# Patient Record
Sex: Female | Born: 1950
Health system: Southern US, Community
[De-identification: ages and names within clinical notes are randomized; demographics above are authoritative.]

## PROBLEM LIST (undated history)

## (undated) ENCOUNTER — Emergency Department (HOSPITAL_COMMUNITY): Admission: EM | Payer: Medicare PPO | Source: Home / Self Care

## (undated) ENCOUNTER — Emergency Department (HOSPITAL_BASED_OUTPATIENT_CLINIC_OR_DEPARTMENT_OTHER): Payer: Medicare PPO

## (undated) DIAGNOSIS — I4510 Unspecified right bundle-branch block: Secondary | ICD-10-CM

## (undated) DIAGNOSIS — E669 Obesity, unspecified: Secondary | ICD-10-CM

## (undated) DIAGNOSIS — J40 Bronchitis, not specified as acute or chronic: Secondary | ICD-10-CM

## (undated) DIAGNOSIS — I6529 Occlusion and stenosis of unspecified carotid artery: Secondary | ICD-10-CM

## (undated) DIAGNOSIS — R4182 Altered mental status, unspecified: Secondary | ICD-10-CM

## (undated) DIAGNOSIS — N183 Chronic kidney disease, stage 3 (moderate): Secondary | ICD-10-CM

## (undated) DIAGNOSIS — I119 Hypertensive heart disease without heart failure: Secondary | ICD-10-CM

## (undated) DIAGNOSIS — E119 Type 2 diabetes mellitus without complications: Secondary | ICD-10-CM

## (undated) DIAGNOSIS — E785 Hyperlipidemia, unspecified: Secondary | ICD-10-CM

## (undated) DIAGNOSIS — D649 Anemia, unspecified: Secondary | ICD-10-CM

## (undated) DIAGNOSIS — I43 Cardiomyopathy in diseases classified elsewhere: Secondary | ICD-10-CM

## (undated) DIAGNOSIS — E079 Disorder of thyroid, unspecified: Secondary | ICD-10-CM

## (undated) DIAGNOSIS — I701 Atherosclerosis of renal artery: Secondary | ICD-10-CM

## (undated) DIAGNOSIS — I428 Other cardiomyopathies: Secondary | ICD-10-CM

## (undated) DIAGNOSIS — R609 Edema, unspecified: Secondary | ICD-10-CM

## (undated) DIAGNOSIS — N179 Acute kidney failure, unspecified: Secondary | ICD-10-CM

## (undated) DIAGNOSIS — I160 Hypertensive urgency: Secondary | ICD-10-CM

## (undated) DIAGNOSIS — K219 Gastro-esophageal reflux disease without esophagitis: Secondary | ICD-10-CM

## (undated) DIAGNOSIS — I2510 Atherosclerotic heart disease of native coronary artery without angina pectoris: Secondary | ICD-10-CM

## (undated) DIAGNOSIS — I5042 Chronic combined systolic (congestive) and diastolic (congestive) heart failure: Secondary | ICD-10-CM

## (undated) DIAGNOSIS — N189 Chronic kidney disease, unspecified: Secondary | ICD-10-CM

## (undated) DIAGNOSIS — R079 Chest pain, unspecified: Secondary | ICD-10-CM

## (undated) DIAGNOSIS — T7840XA Allergy, unspecified, initial encounter: Secondary | ICD-10-CM

## (undated) DIAGNOSIS — Z8543 Personal history of malignant neoplasm of ovary: Secondary | ICD-10-CM

## (undated) DIAGNOSIS — C801 Malignant (primary) neoplasm, unspecified: Secondary | ICD-10-CM

## (undated) DIAGNOSIS — I10 Essential (primary) hypertension: Secondary | ICD-10-CM

## (undated) HISTORY — DX: Gastro-esophageal reflux disease without esophagitis: K21.9

## (undated) HISTORY — DX: Altered mental status, unspecified: R41.82

## (undated) HISTORY — DX: Malignant (primary) neoplasm, unspecified: C80.1

## (undated) HISTORY — DX: Hyperlipidemia, unspecified: E78.5

## (undated) HISTORY — DX: Chronic kidney disease, stage 3 (moderate): N18.3

## (undated) HISTORY — DX: Chronic kidney disease, unspecified: N18.9

## (undated) HISTORY — DX: Hypertensive heart disease without heart failure: I11.9

## (undated) HISTORY — DX: Acute kidney failure, unspecified: N17.9

## (undated) HISTORY — DX: Cardiomyopathy in diseases classified elsewhere: I43

## (undated) HISTORY — DX: Edema, unspecified: R60.9

## (undated) HISTORY — DX: Disorder of thyroid, unspecified: E07.9

## (undated) HISTORY — DX: Chest pain, unspecified: R07.9

## (undated) HISTORY — DX: Other cardiomyopathies: I42.8

## (undated) HISTORY — DX: Atherosclerosis of renal artery: I70.1

## (undated) HISTORY — DX: Anemia, unspecified: D64.9

## (undated) HISTORY — DX: Bronchitis, not specified as acute or chronic: J40

## (undated) HISTORY — DX: Occlusion and stenosis of unspecified carotid artery: I65.29

## (undated) HISTORY — DX: Chronic combined systolic (congestive) and diastolic (congestive) heart failure: I50.42

## (undated) HISTORY — DX: Allergy, unspecified, initial encounter: T78.40XA

## (undated) HISTORY — DX: Hypertensive urgency: I16.0

## (undated) HISTORY — DX: Atherosclerotic heart disease of native coronary artery without angina pectoris: I25.10

## (undated) HISTORY — DX: Type 2 diabetes mellitus without complications: E11.9

---

## 1997-10-12 ENCOUNTER — Ambulatory Visit (HOSPITAL_COMMUNITY): Admission: RE | Admit: 1997-10-12 | Discharge: 1997-10-12 | Payer: Self-pay | Admitting: Gynecology

## 1998-10-15 ENCOUNTER — Encounter: Payer: Self-pay | Admitting: Gynecology

## 1998-10-15 ENCOUNTER — Ambulatory Visit (HOSPITAL_COMMUNITY): Admission: RE | Admit: 1998-10-15 | Discharge: 1998-10-15 | Payer: Self-pay | Admitting: Gynecology

## 1998-10-29 ENCOUNTER — Ambulatory Visit: Admission: RE | Admit: 1998-10-29 | Discharge: 1998-10-29 | Payer: Self-pay | Admitting: Gynecology

## 1998-10-29 ENCOUNTER — Other Ambulatory Visit: Admission: RE | Admit: 1998-10-29 | Discharge: 1998-10-29 | Payer: Self-pay | Admitting: Gynecology

## 1999-11-19 ENCOUNTER — Ambulatory Visit: Admission: RE | Admit: 1999-11-19 | Discharge: 1999-11-19 | Payer: Self-pay | Admitting: Gynecology

## 1999-11-19 ENCOUNTER — Other Ambulatory Visit: Admission: RE | Admit: 1999-11-19 | Discharge: 1999-11-19 | Payer: Self-pay | Admitting: Gynecology

## 1999-11-26 ENCOUNTER — Ambulatory Visit (HOSPITAL_COMMUNITY): Admission: RE | Admit: 1999-11-26 | Discharge: 1999-11-26 | Payer: Self-pay | Admitting: Gynecology

## 1999-11-26 ENCOUNTER — Encounter: Payer: Self-pay | Admitting: Gynecology

## 2003-02-07 ENCOUNTER — Other Ambulatory Visit: Admission: RE | Admit: 2003-02-07 | Discharge: 2003-02-07 | Payer: Self-pay | Admitting: Gynecology

## 2003-02-07 ENCOUNTER — Ambulatory Visit: Admission: RE | Admit: 2003-02-07 | Discharge: 2003-02-07 | Payer: Self-pay | Admitting: Gynecology

## 2004-04-10 ENCOUNTER — Emergency Department (HOSPITAL_COMMUNITY): Admission: EM | Admit: 2004-04-10 | Discharge: 2004-04-10 | Payer: Self-pay | Admitting: Emergency Medicine

## 2004-04-15 ENCOUNTER — Encounter: Admission: RE | Admit: 2004-04-15 | Discharge: 2004-05-08 | Payer: Self-pay | Admitting: Family Medicine

## 2012-07-17 ENCOUNTER — Ambulatory Visit (INDEPENDENT_AMBULATORY_CARE_PROVIDER_SITE_OTHER): Payer: BC Managed Care – PPO | Admitting: Emergency Medicine

## 2012-07-17 VITALS — BP 153/82 | HR 86 | Temp 98.0°F | Resp 18 | Ht 68.0 in | Wt 212.0 lb

## 2012-07-17 DIAGNOSIS — H1045 Other chronic allergic conjunctivitis: Secondary | ICD-10-CM

## 2012-07-17 DIAGNOSIS — H1010 Acute atopic conjunctivitis, unspecified eye: Secondary | ICD-10-CM

## 2012-07-17 MED ORDER — OLOPATADINE HCL 0.1 % OP SOLN: 1.0000 [drp] | Freq: Two times a day (BID) | OPHTHALMIC | Status: DC

## 2012-07-17 NOTE — Progress Notes (Signed)
  Subjective:    Patient ID: Teresa Poole, female    DOB: 1951-07-07, 61 y.o.   MRN: [id number]  HPI Patient presents to clinic with complaints this afternoon, states she has pink eye. She was working at [employer], works as a Retail banker. She had redness of her eye, some soreness with left eye, eyes have been watering. No other complaints. She has had problems with allergies in the past.    Review of Systems     Objective:   Physical Exam  Very pleasant female with normal affect. Alert and oriented. She states her only compliant is with her eyes, redness noted some soreness.  The disc is normal. Lids are normal. There is mild injection lateral conjunctiva of the left      Assessment & Plan:   Meds ordered this encounter  Medications  . olopatadine (PATANOL) 0.1 % ophthalmic solution    Sig: Place 1 drop into both eyes 2 (two) times daily.    Dispense:  5 mL    Refill:  2

## 2012-07-17 NOTE — Progress Notes (Signed)
  Subjective:    Patient ID: Teresa Poole, female    DOB: 1950/09/05, 61 y.o.   MRN: [id number]  HPI    Review of Systems     Objective:   Physical Exam        Assessment & Plan:

## 2013-09-07 ENCOUNTER — Emergency Department (HOSPITAL_COMMUNITY): Payer: BC Managed Care – PPO

## 2013-09-07 ENCOUNTER — Encounter (HOSPITAL_COMMUNITY): Payer: Self-pay | Admitting: Emergency Medicine

## 2013-09-07 ENCOUNTER — Inpatient Hospital Stay (HOSPITAL_COMMUNITY)
Admission: EM | Admit: 2013-09-07 | Discharge: 2013-09-10 | DRG: 193 | Disposition: A | Discharge status: Home / Self Care | Payer: BC Managed Care – PPO | Attending: Internal Medicine | Admitting: Internal Medicine

## 2013-09-07 DIAGNOSIS — T502X5A Adverse effect of carbonic-anhydrase inhibitors, benzothiadiazides and other diuretics, initial encounter: Secondary | ICD-10-CM | POA: Diagnosis not present

## 2013-09-07 DIAGNOSIS — IMO0001 Reserved for inherently not codable concepts without codable children: Secondary | ICD-10-CM | POA: Diagnosis present

## 2013-09-07 DIAGNOSIS — IMO0002 Reserved for concepts with insufficient information to code with codable children: Secondary | ICD-10-CM

## 2013-09-07 DIAGNOSIS — E1165 Type 2 diabetes mellitus with hyperglycemia: Secondary | ICD-10-CM

## 2013-09-07 DIAGNOSIS — I509 Heart failure, unspecified: Secondary | ICD-10-CM | POA: Diagnosis present

## 2013-09-07 DIAGNOSIS — Z9119 Patient's noncompliance with other medical treatment and regimen: Secondary | ICD-10-CM

## 2013-09-07 DIAGNOSIS — J189 Pneumonia, unspecified organism: Principal | ICD-10-CM

## 2013-09-07 DIAGNOSIS — J40 Bronchitis, not specified as acute or chronic: Secondary | ICD-10-CM

## 2013-09-07 DIAGNOSIS — R5383 Other fatigue: Secondary | ICD-10-CM

## 2013-09-07 DIAGNOSIS — R5381 Other malaise: Secondary | ICD-10-CM | POA: Diagnosis present

## 2013-09-07 DIAGNOSIS — R739 Hyperglycemia, unspecified: Secondary | ICD-10-CM

## 2013-09-07 DIAGNOSIS — I159 Secondary hypertension, unspecified: Secondary | ICD-10-CM | POA: Diagnosis present

## 2013-09-07 DIAGNOSIS — Z8543 Personal history of malignant neoplasm of ovary: Secondary | ICD-10-CM

## 2013-09-07 DIAGNOSIS — E669 Obesity, unspecified: Secondary | ICD-10-CM

## 2013-09-07 DIAGNOSIS — R609 Edema, unspecified: Secondary | ICD-10-CM | POA: Diagnosis present

## 2013-09-07 DIAGNOSIS — I110 Hypertensive heart disease with heart failure: Secondary | ICD-10-CM | POA: Diagnosis present

## 2013-09-07 DIAGNOSIS — I152 Hypertension secondary to endocrine disorders: Secondary | ICD-10-CM | POA: Diagnosis present

## 2013-09-07 DIAGNOSIS — I214 Non-ST elevation (NSTEMI) myocardial infarction: Secondary | ICD-10-CM

## 2013-09-07 DIAGNOSIS — I5021 Acute systolic (congestive) heart failure: Secondary | ICD-10-CM | POA: Diagnosis present

## 2013-09-07 DIAGNOSIS — I428 Other cardiomyopathies: Secondary | ICD-10-CM | POA: Diagnosis present

## 2013-09-07 DIAGNOSIS — E1122 Type 2 diabetes mellitus with diabetic chronic kidney disease: Secondary | ICD-10-CM | POA: Diagnosis present

## 2013-09-07 DIAGNOSIS — J3489 Other specified disorders of nose and nasal sinuses: Secondary | ICD-10-CM | POA: Diagnosis present

## 2013-09-07 DIAGNOSIS — Z91199 Patient's noncompliance with other medical treatment and regimen due to unspecified reason: Secondary | ICD-10-CM

## 2013-09-07 DIAGNOSIS — R7989 Other specified abnormal findings of blood chemistry: Secondary | ICD-10-CM

## 2013-09-07 DIAGNOSIS — R6889 Other general symptoms and signs: Secondary | ICD-10-CM

## 2013-09-07 DIAGNOSIS — I5041 Acute combined systolic (congestive) and diastolic (congestive) heart failure: Secondary | ICD-10-CM | POA: Diagnosis present

## 2013-09-07 DIAGNOSIS — N179 Acute kidney failure, unspecified: Secondary | ICD-10-CM

## 2013-09-07 DIAGNOSIS — Z6834 Body mass index (BMI) 34.0-34.9, adult: Secondary | ICD-10-CM

## 2013-09-07 DIAGNOSIS — R748 Abnormal levels of other serum enzymes: Secondary | ICD-10-CM

## 2013-09-07 DIAGNOSIS — I10 Essential (primary) hypertension: Secondary | ICD-10-CM | POA: Diagnosis present

## 2013-09-07 HISTORY — DX: Personal history of malignant neoplasm of ovary: Z85.43

## 2013-09-07 HISTORY — DX: Obesity, unspecified: E66.9

## 2013-09-07 HISTORY — DX: Essential (primary) hypertension: I10

## 2013-09-07 HISTORY — DX: Bronchitis, not specified as acute or chronic: J40

## 2013-09-07 LAB — BASIC METABOLIC PANEL
BUN: 18 mg/dL (ref 6–23)
CO2: 25 mEq/L (ref 19–32)
Calcium: 8.9 mg/dL (ref 8.4–10.5)
Chloride: 96 mEq/L (ref 96–112)
Creatinine, Ser: 1.05 mg/dL (ref 0.50–1.10)
GFR calc Af Amer: 65 mL/min — ABNORMAL LOW (ref >90)
GFR, EST NON AFRICAN AMERICAN: 56 mL/min — AB (ref >90)
GLUCOSE: 460 mg/dL — AB (ref 70–99)
POTASSIUM: 3.9 meq/L (ref 3.7–5.3)
Sodium: 136 mEq/L — ABNORMAL LOW (ref 137–147)

## 2013-09-07 LAB — PRO B NATRIURETIC PEPTIDE: PRO B NATRI PEPTIDE: 1402 pg/mL — AB (ref 0–125)

## 2013-09-07 LAB — GLUCOSE, CAPILLARY
GLUCOSE-CAPILLARY: 437 mg/dL — AB (ref 70–99)
GLUCOSE-CAPILLARY: 299 mg/dL — AB (ref 70–99)
GLUCOSE-CAPILLARY: 252 mg/dL — AB (ref 70–99)
Glucose-Capillary: 233 mg/dL — ABNORMAL HIGH (ref 70–99)
Glucose-Capillary: 210 mg/dL — ABNORMAL HIGH (ref 70–99)

## 2013-09-07 LAB — CBC
HEMATOCRIT: 37.7 % (ref 36.0–46.0)
Hemoglobin: 12.7 g/dL (ref 12.0–15.0)
MCH: 28.3 pg (ref 26.0–34.0)
MCHC: 33.7 g/dL (ref 30.0–36.0)
MCV: 84.2 fL (ref 78.0–100.0)
Platelets: 381 10*3/uL (ref 150–400)
RBC: 4.48 MIL/uL (ref 3.87–5.11)
RDW: 13.1 % (ref 11.5–15.5)
WBC: 7.8 10*3/uL (ref 4.0–10.5)

## 2013-09-07 LAB — POCT I-STAT TROPONIN I: Troponin i, poc: 0.09 ng/mL (ref 0.00–0.08)

## 2013-09-07 LAB — PROTIME-INR
INR: 0.89 (ref 0.00–1.49)
Prothrombin Time: 11.9 seconds (ref 11.6–15.2)

## 2013-09-07 LAB — TROPONIN I: Troponin I: <0.3 ng/mL (ref <0.30)

## 2013-09-07 LAB — MRSA PCR SCREENING: MRSA BY PCR: NEGATIVE

## 2013-09-07 LAB — APTT: APTT: 26 s (ref 24–37)

## 2013-09-07 MED ORDER — SODIUM CHLORIDE 0.9 % IV SOLN: INTRAVENOUS | Status: DC

## 2013-09-07 MED ORDER — ASPIRIN 325 MG PO TABS
325.0000 mg | ORAL_TABLET | Freq: Once | ORAL | Status: AC
  Administered 2013-09-07: 325 mg via ORAL
  Filled 2013-09-07: qty 1

## 2013-09-07 MED ORDER — INSULIN ASPART 100 UNIT/ML ~~LOC~~ SOLN
0.0000 [IU] | SUBCUTANEOUS | Status: DC
  Administered 2013-09-07: 5 [IU] via SUBCUTANEOUS

## 2013-09-07 MED ORDER — ENALAPRILAT 1.25 MG/ML IV SOLN
1.2500 mg | Freq: Once | INTRAVENOUS | Status: AC
  Administered 2013-09-07: 1.25 mg via INTRAVENOUS
  Filled 2013-09-07: qty 1

## 2013-09-07 MED ORDER — HYDRALAZINE HCL 20 MG/ML IJ SOLN
10.0000 mg | Freq: Once | INTRAMUSCULAR | Status: AC
  Administered 2013-09-07: 10 mg via INTRAVENOUS
  Filled 2013-09-07: qty 1
  Filled 2013-09-07: qty 0.5

## 2013-09-07 MED ORDER — FUROSEMIDE 10 MG/ML IJ SOLN
40.0000 mg | Freq: Once | INTRAMUSCULAR | Status: AC
Start: 2013-09-07 — End: 2013-09-07
  Administered 2013-09-07: 40 mg via INTRAVENOUS
  Filled 2013-09-07: qty 4

## 2013-09-07 MED ORDER — SODIUM CHLORIDE 0.9 % IJ SOLN
3.0000 mL | Freq: Two times a day (BID) | INTRAMUSCULAR | Status: DC
  Administered 2013-09-08: 10 mL via INTRAVENOUS
  Administered 2013-09-09 – 2013-09-10 (×3): 3 mL via INTRAVENOUS

## 2013-09-07 MED ORDER — NITROGLYCERIN 0.4 MG SL SUBL: 0.4000 mg | SUBLINGUAL_TABLET | SUBLINGUAL | Status: DC | PRN

## 2013-09-07 MED ORDER — INFLUENZA VAC SPLIT QUAD 0.5 ML IM SUSP
0.5000 mL | INTRAMUSCULAR | Status: AC
  Administered 2013-09-08: 0.5 mL via INTRAMUSCULAR
  Filled 2013-09-07 (×2): qty 0.5

## 2013-09-07 MED ORDER — INSULIN ASPART 100 UNIT/ML ~~LOC~~ SOLN
0.0000 [IU] | Freq: Three times a day (TID) | SUBCUTANEOUS | Status: DC
  Administered 2013-09-08: 5 [IU] via SUBCUTANEOUS

## 2013-09-07 MED ORDER — INSULIN REGULAR HUMAN 100 UNIT/ML IJ SOLN
INTRAMUSCULAR | Status: DC
  Administered 2013-09-07: 3.8 [IU]/h via INTRAVENOUS
  Filled 2013-09-07: qty 1

## 2013-09-07 MED ORDER — PNEUMOCOCCAL VAC POLYVALENT 25 MCG/0.5ML IJ INJ
0.5000 mL | INJECTION | INTRAMUSCULAR | Status: AC
  Administered 2013-09-08: 0.5 mL via INTRAMUSCULAR
  Filled 2013-09-07 (×2): qty 0.5

## 2013-09-07 MED ORDER — AMLODIPINE BESYLATE 5 MG PO TABS
5.0000 mg | ORAL_TABLET | Freq: Every day | ORAL | Status: DC
  Administered 2013-09-07 – 2013-09-10 (×4): 5 mg via ORAL
  Filled 2013-09-07 (×4): qty 1

## 2013-09-07 MED ORDER — GUAIFENESIN ER 600 MG PO TB12
1200.0000 mg | ORAL_TABLET | Freq: Two times a day (BID) | ORAL | Status: DC
  Administered 2013-09-07: 1200 mg via ORAL
  Filled 2013-09-07 (×3): qty 2

## 2013-09-07 MED ORDER — METOPROLOL TARTRATE 25 MG PO TABS
25.0000 mg | ORAL_TABLET | Freq: Two times a day (BID) | ORAL | Status: DC
  Administered 2013-09-07 – 2013-09-10 (×6): 25 mg via ORAL
  Filled 2013-09-07 (×7): qty 1

## 2013-09-07 MED ORDER — ACETAMINOPHEN 325 MG PO TABS: 650.0000 mg | ORAL_TABLET | Freq: Four times a day (QID) | ORAL | Status: DC | PRN

## 2013-09-07 MED ORDER — ONDANSETRON HCL 4 MG/2ML IJ SOLN: 4.0000 mg | Freq: Four times a day (QID) | INTRAMUSCULAR | Status: DC | PRN

## 2013-09-07 MED ORDER — HEPARIN (PORCINE) IN NACL 100-0.45 UNIT/ML-% IJ SOLN
1300.0000 [IU]/h | INTRAMUSCULAR | Status: DC
  Administered 2013-09-07: 1300 [IU]/h via INTRAVENOUS
  Filled 2013-09-07 (×2): qty 250

## 2013-09-07 MED ORDER — HYDRALAZINE HCL 20 MG/ML IJ SOLN
10.0000 mg | INTRAMUSCULAR | Status: DC | PRN
  Administered 2013-09-08: 10 mg via INTRAVENOUS

## 2013-09-07 MED ORDER — ONDANSETRON HCL 4 MG PO TABS: 4.0000 mg | ORAL_TABLET | Freq: Four times a day (QID) | ORAL | Status: DC | PRN

## 2013-09-07 MED ORDER — ACETAMINOPHEN 650 MG RE SUPP: 650.0000 mg | Freq: Four times a day (QID) | RECTAL | Status: DC | PRN

## 2013-09-07 MED ORDER — SODIUM CHLORIDE 0.9 % IV SOLN
1.5000 g | Freq: Four times a day (QID) | INTRAVENOUS | Status: DC
  Administered 2013-09-07 – 2013-09-09 (×7): 1.5 g via INTRAVENOUS
  Filled 2013-09-07 (×9): qty 1.5

## 2013-09-07 MED ORDER — ASPIRIN EC 325 MG PO TBEC
325.0000 mg | DELAYED_RELEASE_TABLET | Freq: Every day | ORAL | Status: DC
  Administered 2013-09-08: 325 mg via ORAL
  Filled 2013-09-07 (×2): qty 1

## 2013-09-07 MED ORDER — HEPARIN BOLUS VIA INFUSION
2200.0000 [IU] | Freq: Once | INTRAVENOUS | Status: AC
  Administered 2013-09-07: 2200 [IU] via INTRAVENOUS
  Filled 2013-09-07: qty 2200

## 2013-09-07 MED ORDER — LEVOFLOXACIN IN D5W 750 MG/150ML IV SOLN: 750.0000 mg | INTRAVENOUS | Status: DC

## 2013-09-07 NOTE — Progress Notes (Signed)
ANTIBIOTIC CONSULT NOTE - INITIAL  Pharmacy Consult for Unasyn Indication: Bronchitis  No Known Allergies  Patient Measurements: Height: 5\' 6"  (167.6 cm) (RN estimate) Weight: 212 lb (96.163 kg) (RN estimate) IBW/kg (Calculated) : 59.3  Vital Signs: Temp: 98.8 F (37.1 C) (01/15 1443) Temp src: Oral (01/15 1443) BP: 175/82 mmHg (01/15 1859) Pulse Rate: 94 (01/15 1859) Intake/Output from previous day:   Intake/Output from this shift:    Labs:  Recent Labs  09/07/13 1534  WBC 7.8  HGB 12.7  PLT 381  CREATININE 1.05   Estimated Creatinine Clearance: 65.8 ml/min (by C-G formula based on Cr of 1.05). No results found for this basename: VANCOTROUGH, VANCOPEAK, VANCORANDOM, GENTTROUGH, GENTPEAK, GENTRANDOM, TOBRATROUGH, TOBRAPEAK, TOBRARND, AMIKACINPEAK, AMIKACINTROU, AMIKACIN,  in the last 72 hours   Microbiology: No results found for this or any previous visit (from the past 720 hour(s)).  Medical History: Past Medical History  Diagnosis Date  . Diabetes mellitus without complication   . Hypertension   . History of ovarian cancer   . Obesity (BMI 30-39.9)     Medications:  Scheduled:  . amLODipine  5 mg Oral Daily  . [START ON 09/08/2013] aspirin EC  325 mg Oral Daily  . aspirin  325 mg Oral Once  . guaiFENesin  1,200 mg Oral BID  . heparin  2,200 Units Intravenous Once  . hydrALAZINE  10 mg Intravenous Once  . [START ON 09/08/2013] influenza vac split quadrivalent PF  0.5 mL Intramuscular Tomorrow-1000  . insulin aspart  0-15 Units Subcutaneous Q4H  . metoprolol tartrate  25 mg Oral BID  . [START ON 09/08/2013] pneumococcal 23 valent vaccine  0.5 mL Intramuscular Tomorrow-1000  . sodium chloride  3 mL Intravenous Q12H   Infusions:  . sodium chloride    . heparin     Assessment: 63 yo female admitted with HTN, hyperglycemia and possible MI and started on heparin drip. Now starting antibiotics for bronchitis - avoiding quinolones due to prolonged QT interval  (512 ms) therefore beginning Unasyn per Pharmacy.  Goal of Therapy:  Eradication of infection Dose per renal function  Plan:   Unasyn 1.5g IV q6h Follow up renal function & cultures  Peggyann Juba, PharmD, BCPS Pager: [phone] 09/07/2013,7:39 PM

## 2013-09-07 NOTE — ED Provider Notes (Addendum)
CSN: [id number]     Arrival date & time 09/07/13  1422 History  First MD Initiated Contact with Patient 09/07/13 1455     Chief Complaint  Patient presents with  . Hyperglycemia  . Hypertension    HPI Pt states she was having sinus congestion and increased mucus in her chest for the last couple of weeks.  Pt has felt like she has not been breathing normally because of it.  She felt bad enough to not go to work the first week of January.  Today she went to work but was not feeling well. Today she had an episode of feeling short of breath but no pain in her chest.   She went to her doctor's today to be checked.  She has not been taking any of her medications for the last few months.  She was found to be hypertensive and hyperglycemic with abnormal blood tests per patient so she was sent to the ED.  Past Medical History  Diagnosis Date  . Diabetes mellitus without complication   . Hypertension    History reviewed. No pertinent past surgical history. No family history on file. History  Substance Use Topics  . Smoking status: Never Smoker   . Smokeless tobacco: Not on file  . Alcohol Use: No   OB History   Grav Para Term Preterm Abortions TAB SAB Ect Mult Living                 Review of Systems  Constitutional: Negative for fever.  Respiratory: Positive for cough.   Gastrointestinal: Negative for vomiting and diarrhea.  Endocrine: Negative for polydipsia.  Genitourinary: Negative for dysuria.  Neurological: Negative for numbness and headaches.  All other systems reviewed and are negative.    Allergies  Review of patient's allergies indicates no known allergies.  Home Medications   Current Outpatient Rx  Name  Route  Sig  Dispense  Refill  . GuaiFENesin (MUCINEX PO)   Oral   Take 1 tablet by mouth daily as needed (cold).         . Pseudoephedrine-APAP-DM (DAYQUIL PO)   Oral   Take 1 tablet by mouth daily as needed (cold).         . sodium-potassium bicarbonate  (ALKA-SELTZER GOLD) TBEF dissolvable tablet   Oral   Take 1 tablet by mouth daily as needed (cold).          BP 190/95  Pulse 114  Temp(Src) 98.8 F (37.1 C) (Oral)  Resp 20  SpO2 97% Physical Exam  Nursing note and vitals reviewed. Constitutional: She appears well-developed and well-nourished. No distress.  Hypertensive   HENT:  Head: Normocephalic and atraumatic.  Right Ear: External ear normal.  Left Ear: External ear normal.  Eyes: Conjunctivae are normal. Right eye exhibits no discharge. Left eye exhibits no discharge. No scleral icterus.  Neck: Neck supple. No tracheal deviation present.  Cardiovascular: Normal rate, regular rhythm and intact distal pulses.   Pulmonary/Chest: Effort normal and breath sounds normal. No stridor. No respiratory distress. She has no wheezes. She has no rales.  Abdominal: Soft. Bowel sounds are normal. She exhibits no distension. There is no tenderness. There is no rebound and no guarding.  Musculoskeletal: She exhibits no edema and no tenderness.  Neurological: She is alert. She has normal strength. No sensory deficit. Cranial nerve deficit:  no gross defecits noted. She exhibits normal muscle tone. She displays no seizure activity. Coordination normal.  Skin: Skin is warm and dry.  No rash noted.  Psychiatric: She has a normal mood and affect.    ED Course  Procedures (including critical care time) Labs Review Labs Reviewed  GLUCOSE, CAPILLARY - Abnormal; Notable for the following:    Glucose-Capillary 437 (*)    All other components within normal limits  BASIC METABOLIC PANEL - Abnormal; Notable for the following:    Sodium 136 (*)    Glucose, Bld 460 (*)    GFR calc non Af Amer 56 (*)    GFR calc Af Amer 65 (*)    All other components within normal limits  PRO B NATRIURETIC PEPTIDE - Abnormal; Notable for the following:    Pro B Natriuretic peptide (BNP) 1402.0 (*)    All other components within normal limits  POCT I-STAT TROPONIN  I - Abnormal; Notable for the following:    Troponin i, poc 0.09 (*)    All other components within normal limits  CBC   Imaging Review Dg Chest 2 View  09/07/2013   CLINICAL DATA:  Shortness of breath, cough  EXAM: CHEST  2 VIEW  COMPARISON:  09/07/2013  FINDINGS: Cardiac silhouette is mild-to-moderately enlarged. Mediastinal contours are unremarkable. Ill-defined area of increased density projects in the region of the lingula. This finding is unchanged compared to previous study. No further focal regions of consolidation or focal infiltrates. The osseous structures are unremarkable.  IMPRESSION: Infiltrate versus atelectasis in the region of the lingula.   Electronically Signed   By: Margaree Mackintosh M.D.   On: 09/07/2013 15:55    EKG Interpretation    Date/Time:  Thursday September 07 2013 15:08:54 EST Ventricular Rate:  102 PR Interval:  150 QRS Duration: 99 QT Interval:  393 QTC Calculation: 512 R Axis:   -18 Text Interpretation:  Sinus tachycardia Left atrial enlargement Left ventricular hypertrophy Poor R wave progression Cannot rule out Anterior infarct Prolonged QT interval No previous tracing Reconfirmed by Ariyon Gerstenberger  MD-J, Lamaj Metoyer ([id number]) on 09/07/2013 4:58:40 PM           Medications  insulin regular (NOVOLIN R,HUMULIN R) 1 Units/mL in sodium chloride 0.9 % 100 mL infusion (3.8 Units/hr Intravenous New Bag/Given 09/07/13 1617)  enalaprilat (VASOTEC) injection 1.25 mg (not administered)  furosemide (LASIX) injection 40 mg (not administered)    MDM   1. Hyperglycemia   2. Hypertension   3. Elevated brain natriuretic peptide (BNP) level    Pt has not been taking her Blood pressure or diabetes medications for months.  She has been having episodes of shortness of breath, ?anginal equivalent.  Asymptomatic now.  Will consult with medicine for admission observation, serial enzymes.     Kathalene Frames, MD 09/07/13 1639  Consulted with Dr Wynonia Lawman, cardiology who will see the  patient.  Kathalene Frames, MD 09/07/13 [phone]

## 2013-09-07 NOTE — H&P (Addendum)
[facility] History and Physical  Teresa Poole [id number] DOB: 06/04/1951 DOA: 09/07/2013  Referring physician: Dr. Tomi Bamberger PCP: Gennette Pac, MD   Chief Complaint:  Productive cough for 3 weeks Shortness of breath with fatigue and chest discomfort x 1 day  HPI:  63 year old obese female with history of hypertension, diabetes mellitus (has not been taking any of her medications for over 3 months for reasons unclear) has been having symptoms of cough with whitish to yellowish phlegm for possible 3 weeks. She also reports fatigue with these symptoms. Today when she went to work she felt extremely tired and short of breath on minimal exertion. She reports that she had to hold onto the wall in order to walk. She also felt chest heaviness and went to see the PCP. At the PCP office she was found to be hypertensive and hyperglycemic and was told to come to the ED. Patient denies any headache, blurred vision, dizziness, nausea, vomiting, orthopnea, PND, abdominal pain, subjective fever or chills, bowel or urinary symptoms. She does report polyuria but denies polydipsia. She reports driving to [place]  3 weeks back. She does report having mild leg swellings which is chronic.   Course in the ED Patient was found to be  Tachycardic to 114 blood pressure elevated to 190/95 mmHg., afebrile and normal O2 sat on room air. Blood work done showed a normal CBC, hyperglycemia with blood glucose of 460. EKG showed LVH with T wave inversion in anterior leads and prolonged QTc,. Chest x-rays showed infiltrate versus atelectasis over the left lingula. A stat troponin in the ED was positive at 0.09. Pro BNP elevated to 1400. [facility]. Patient was given a dose of enalaprilat , IV lasix in the ED.   Review of Systems:  Constitutional: Denies fever, chills, diaphoresis, appetite change,  Fatigue+.  HEENT: Congestion, rhinorrhea, Denies photophobia, eye pain, redness,  hearing loss, ear pain, sore throat,sneezing, mouth sores, trouble swallowing, neck pain, neck stiffness and tinnitus.   Respiratory:  SOB, DOE, cough, chest tightness,  denies wheezing.   Cardiovascular: Chest heaviness +, Denies  palpitations and leg swelling.  Gastrointestinal: Denies nausea, vomiting, abdominal pain, diarrhea, constipation, blood in stool and abdominal distention.  Genitourinary: Denies dysuria, urgency, frequency, hematuria, flank pain and difficulty urinating.  Endocrine: reports polyuria Musculoskeletal: Denies myalgias, back pain, joint swelling, arthralgias and gait problem.  Skin: Denies pallor, rash and wound.  Neurological: weakness, Denies dizziness, seizures, syncope,  light-headedness, numbness and headaches.  Psychiatric/Behavioral: Denies  confusion, nervousness, sleep disturbance and agitation   Past Medical History  Diagnosis Date  . Diabetes mellitus without complication   . Hypertension   . Cancer     ovarian cancer per pt   Past Surgical History  Procedure Laterality Date  . Abdominal hysterectomy     Social History:  reports that she has never smoked. She has never used smokeless tobacco. She reports that she does not drink alcohol or use illicit drugs.  No Known Allergies  History reviewed. No pertinent family history.  Prior to Admission medications   Medication Sig Start Date End Date Taking? Authorizing Provider  GuaiFENesin (MUCINEX PO) Take 1 tablet by mouth daily as needed (cold).   Yes Historical Provider, MD  Pseudoephedrine-APAP-DM (DAYQUIL PO) Take 1 tablet by mouth daily as needed (cold).   Yes Historical Provider, MD  sodium-potassium bicarbonate (ALKA-SELTZER GOLD) TBEF dissolvable tablet Take 1 tablet by mouth daily as needed (cold).   Yes Historical Provider, MD  Physical Exam:  Filed Vitals:   09/07/13 1443 09/07/13 1650 09/07/13 1700  BP: 190/95 169/85 171/93  Pulse: 114 95 95  Temp: 98.8 F (37.1 C)    TempSrc:  Oral    Resp: 20 16 21   SpO2: 97% 99% 100%    Constitutional: Vital signs reviewed. The lesion female in no acute distress HEENT: No pallor, no icterus, dry oral mucosa Chest: Clear to auscultation bilaterally, no added sound  Cardiovascular: RRR, S1 normal, S2 normal, no MRG, Pulmonary/Chest: CTAB, no wheezes, rales, or rhonchi Abdominal: Soft. Non-tender, non-distended, bowel sounds are normal Extremities: Warm, no edema CNS: AAO x3    Labs on Admission:  Basic Metabolic Panel:  Recent Labs Lab 09/07/13 1534  NA 136*  K 3.9  CL 96  CO2 25  GLUCOSE 460*  BUN 18  CREATININE 1.05  CALCIUM 8.9   Liver Function Tests: No results found for this basename: AST, ALT, ALKPHOS, BILITOT, PROT, ALBUMIN,  in the last 168 hours No results found for this basename: LIPASE, AMYLASE,  in the last 168 hours No results found for this basename: AMMONIA,  in the last 168 hours CBC:  Recent Labs Lab 09/07/13 1534  WBC 7.8  HGB 12.7  HCT 37.7  MCV 84.2  PLT 381   Cardiac Enzymes: No results found for this basename: CKTOTAL, CKMB, CKMBINDEX, TROPONINI,  in the last 168 hours BNP: No components found with this basename: POCBNP,  CBG:  Recent Labs Lab 09/07/13 1447  GLUCAP 437*    Radiological Exams on Admission: Dg Chest 2 View  09/07/2013   CLINICAL DATA:  Shortness of breath, cough  EXAM: CHEST  2 VIEW  COMPARISON:  09/07/2013  FINDINGS: Cardiac silhouette is mild-to-moderately enlarged. Mediastinal contours are unremarkable. Ill-defined area of increased density projects in the region of the lingula. This finding is unchanged compared to previous study. No further focal regions of consolidation or focal infiltrates. The osseous structures are unremarkable.  IMPRESSION: Infiltrate versus atelectasis in the region of the lingula.   Electronically Signed   By: Margaree Mackintosh M.D.   On: 09/07/2013 15:55    EKG: Sinus tachycardia at 102, T-wave inversion in anterior leads,LVH,  prolonged QTc  Assessment/Plan  Principle problem Elevated troponin/ NSTEMI Admit to step down. Possible for demand ischemia with underlying illness vs silent MI -ASA 325 mg daily , s/l nitrate prn. Started on IV heparin drip cardiology consulted  check serial troponins. -added metoprolol . Denies hx of CAD and stress test in past. - Check 2D echo. -Symptoms unlikely for Acute PE.     Active Problems:   Diabetes mellitus type II, uncontrolled Blood glucose greater than 450. No anion gap. In the setting of medication noncompliance and no followup for over 3 months. Placed on insulin drip in the ED and will monitor her blood glucose periodically. Check hemoglobin A1c.     Flu-like symptoms Supportive care with tylenol, mucinex, IV fluids.  Monitor o2 sat. Check flu PCR     Uncontrolled hypertension In the setting of underlying illness and medication noncompliance. Patient given a dose of Enalaprilat in ED. blood pressure still elevated. Ordered a dose of IV hydralazine. We'll place on oral metoprolol and when necessary IV hydralazine. Emphasized on the medication compliance.    Bronchitis Chest x-ray suggestive all left lingular infiltrate versus atelectasis. Will obtain blood culture and place on empiric Levaquin  DVT prophylaxis : IV heparin  Diet: NPO until cardiology evalaution  Code Status: full code Family  Communication: sister at bedside Disposition Plan: Home once stable  Louellen Molder [facility] Pager [phone]  If 7PM-7AM, please contact night-coverage [email or web address] Password [facility] 09/07/2013, 5:48 PM  Total time spent: 70 minutes

## 2013-09-07 NOTE — ED Notes (Addendum)
Hillard Danker EDP and Mcclure RN  made aware of patient I-Stat results.

## 2013-09-07 NOTE — Progress Notes (Signed)
ANTICOAGULATION CONSULT NOTE - Initial Consult  Pharmacy Consult for Heparin Indication: chest pain/ACS  No Known Allergies  Patient Measurements: Height: 5\' 6"  (167.6 cm) (RN estimate) Weight: 212 lb (96.163 kg) (RN estimate) IBW/kg (Calculated) : 59.3 Heparin Dosing Weight: 80kg  Vital Signs: Temp: 98.8 F (37.1 C) (01/15 1443) Temp src: Oral (01/15 1443) BP: 171/93 mmHg (01/15 1700) Pulse Rate: 95 (01/15 1700)  Labs:  Recent Labs  09/07/13 1534  HGB 12.7  HCT 37.7  PLT 381  CREATININE 1.05    Estimated Creatinine Clearance: 65.8 ml/min (by C-G formula based on Cr of 1.05).   Medical History: Past Medical History  Diagnosis Date  . Diabetes mellitus without complication   . Hypertension   . Cancer     ovarian cancer per pt    Medications:  Scheduled:  . aspirin  325 mg Oral Once  . enalaprilat  1.25 mg Intravenous Once  . furosemide  40 mg Intravenous Once  . hydrALAZINE  10 mg Intravenous Once  . [START ON 09/08/2013] influenza vac split quadrivalent PF  0.5 mL Intramuscular Tomorrow-1000  . [START ON 09/08/2013] pneumococcal 23 valent vaccine  0.5 mL Intramuscular Tomorrow-1000   Infusions:  . insulin (NOVOLIN-R) infusion 2.4 Units/hr (09/07/13 1741)    Assessment: 63 yo female with DM2 and HTN who has not been taking her meds presents with cough, SOB and chest discomfort. Troponin elevated and T-wave inversions seen on EKG. Cardiology consulted due to concern for demand ischemia vs silent MI. Pharmacy asked to dose IV heparin.  No anticoagulant use PTA  CBC normal  Baseline coags pending  Goal of Therapy:  Heparin level 0.3-0.7 units/ml Monitor platelets by anticoagulation protocol: Yes   Plan:   Heparin 2200 units IV bolus, then  Heparin 1300 units/hr  Heparin level 6hrs after starting  Daily heparin level and CBC  Peggyann Juba, PharmD, BCPS Pager: [phone] 09/07/2013,5:59 PM

## 2013-09-07 NOTE — ED Notes (Signed)
Pt reports SOB and feeling like she is dehydrated.  Pt went to see her doctor today and was found to be hyperglycemic and hypertensive.  Pt reports that she has been out of her meds since late October.

## 2013-09-07 NOTE — ED Notes (Signed)
Insulin verified by Freda Munro, RN

## 2013-09-07 NOTE — Consult Note (Addendum)
Cardiology Consult Note  Admit date: 09/07/2013 Name: Teresa Poole 63 y.o.  female DOB:  1951/03/18 MRN:  [id number]  Today's date:  09/07/2013  Referring Physician: [facility]  Primary Physician:    Dr. Hulan Fess  Reason for Consultation:    Abnormal troponin, shortness of breath and elevation of blood pressure  IMPRESSIONS: 1. Accelerated hypertension due to medical noncompliance 2. Low-level elevation of troponin was nonspecific and may be secondary to elevation of blood pressure or 2 mild congestive heart failure-do not think that this is an acute coronary syndrome and if anything is more likely to be a type II elevation 3. Uncontrolled diabetes mellitus 4. Medical noncompliance 5. Recent upper respiratory infection and bronchitis 6. Obesity  RECOMMENDATION: 1. Control blood pressure. I would go ahead and initiate diuretics since her BNP is elevated and check an echocardiogram 2. Add beta blocker 3. Controlled diabetes 4. Cycle serial troponins. If they remain low level, I would defer exercise testing with Lexiscan until she has good blood pressure and diabetic control  HISTORY: This 63 year old black female is seen at the request of the hospitalist and emergency room physician for low-level elevation of troponin in the setting of medical noncompliance and accelerated hypertension. The patient has a long-standing history of hypertension diabetes. She has evidently seen [facility] cardiology over the years for unclear reasons and has had stress testing in the past. She has been sick the past 2 weeks complaining of cough upper respiratory congestion has been using pseudoephedrine. Of note she ran out of her medicines in October and has had no diabetic or hypertensive medicines since October. She went back to work but last evening complained of severe fatigue and describes being quite wiped out and unable to do anything last night. She went to work this morning and  was urged to go to the doctor by her coworkers because she felt as if she could not catch her breath and was coughing and having other issues. She was seen at [facility] and sent to the emergency room. Her blood pressure was markedly elevated here and she had an elevation of her BNP level as well as a borderline elevation of troponin. I could get no history of ischemic sounding chest pain. She may have had some vague back pain as well as left-sided pain. She has not had PND orthopnea. She does not have claudication. She does note that her ankles have been somewhat swollen recently.  Past Medical History  Diagnosis Date  . Diabetes mellitus without complication   . Hypertension   . History of ovarian cancer   . Obesity (BMI 30-39.9)       Past Surgical History  Procedure Laterality Date  . Abdominal hysterectomy       Allergies:  has No Known Allergies.   Medications: Prior to Admission medications   Medication Sig Start Date End Date Taking? Authorizing Provider  GuaiFENesin (MUCINEX PO) Take 1 tablet by mouth daily as needed (cold).   Yes Historical Provider, MD  Pseudoephedrine-APAP-DM (DAYQUIL PO) Take 1 tablet by mouth daily as needed (cold).   Yes Historical Provider, MD  sodium-potassium bicarbonate (ALKA-SELTZER GOLD) TBEF dissolvable tablet Take 1 tablet by mouth daily as needed (cold).   Yes Historical Provider, MD    Family History: Family Status  Relation Status Death Age  . Mother Alive   . Father Alive   . Sister Alive   . Brother Alive     Social History:   reports  that she has never smoked. She has never used smokeless tobacco. She reports that she does not drink alcohol or use illicit drugs.   History   Social History Narrative   Works at [employer]    Review of Systems: She is been obese for several years, she has had blurred vision recently. She complains of reflux and notes that her meal portion is been cut down but she states that it makes her feel  better with this. She has episodic constipation. Occasional hesitancy as well as frequency. She has mild arthritis. Other than as noted above the remainder of the review of systems is unremarkable.  Physical Exam: BP 156/89  Pulse 97  Temp(Src) 98.8 F (37.1 C) (Oral)  Resp 22  Ht 5\' 6"  (1.676 m)  Wt 96.163 kg (212 lb)  BMI 34.23 kg/m2  SpO2 98%  General appearance: Obese, very talkative, sometimes dramatic black female in no acute distress Head: Normocephalic, without obvious abnormality, atraumatic Eyes: conjunctivae/corneas clear. PERRL, EOM's intact. Fundi benign. Neck: no adenopathy, no carotid bruit, no JVD and supple, symmetrical, trachea midline Lungs: clear to auscultation bilaterally Heart: regular rate and rhythm, S1, S2 normal, no murmur, click, rub or gallop Abdomen: soft, non-tender; bowel sounds normal; no masses,  no organomegaly Pelvic: deferred Extremities: 1+ edema Pulses: 2+ and symmetric Neurologic: Grossly normal  Labs: CBC  Recent Labs  09/07/13 1534  WBC 7.8  RBC 4.48  HGB 12.7  HCT 37.7  PLT 381  MCV 84.2  MCH 28.3  MCHC 33.7  RDW 13.1   CMP   Recent Labs  09/07/13 1534  NA 136*  K 3.9  CL 96  CO2 25  GLUCOSE 460*  BUN 18  CREATININE 1.05  CALCIUM 8.9  GFRNONAA 56*  GFRAA 65*   BNP (last 3 results)  Recent Labs  09/07/13 1534  PROBNP 1402.0*   Cardiac Panel (last 3 results) Troponin (Point of Care Test)  Recent Labs  09/07/13 1540  TROPIPOC 0.09*      Radiology: Possible lingular infiltrate, cardiomegaly  EKG: LVH by voltage, nonspecific ST changes Poor R wave progression  Signed:  W. Doristine Church MD [facility]   Cardiology Consultant  09/07/2013, 6:45 PM

## 2013-09-07 NOTE — ED Notes (Signed)
Per EMS, pt transported from her doctor's office d/t hyperglycemia and elevated BP.  Pt reports she ran out of her meds since November.  Pt is A&Ox 4.  ambulatory

## 2013-09-08 DIAGNOSIS — I517 Cardiomegaly: Secondary | ICD-10-CM

## 2013-09-08 DIAGNOSIS — I10 Essential (primary) hypertension: Secondary | ICD-10-CM

## 2013-09-08 DIAGNOSIS — R7309 Other abnormal glucose: Secondary | ICD-10-CM

## 2013-09-08 DIAGNOSIS — R748 Abnormal levels of other serum enzymes: Secondary | ICD-10-CM

## 2013-09-08 DIAGNOSIS — IMO0001 Reserved for inherently not codable concepts without codable children: Secondary | ICD-10-CM

## 2013-09-08 DIAGNOSIS — E1165 Type 2 diabetes mellitus with hyperglycemia: Secondary | ICD-10-CM

## 2013-09-08 DIAGNOSIS — R799 Abnormal finding of blood chemistry, unspecified: Secondary | ICD-10-CM

## 2013-09-08 DIAGNOSIS — E669 Obesity, unspecified: Secondary | ICD-10-CM

## 2013-09-08 LAB — CBC
HEMATOCRIT: 34.7 % — AB (ref 36.0–46.0)
Hemoglobin: 11.4 g/dL — ABNORMAL LOW (ref 12.0–15.0)
MCH: 27.8 pg (ref 26.0–34.0)
MCHC: 32.9 g/dL (ref 30.0–36.0)
MCV: 84.6 fL (ref 78.0–100.0)
PLATELETS: 359 10*3/uL (ref 150–400)
RBC: 4.1 MIL/uL (ref 3.87–5.11)
RDW: 13.2 % (ref 11.5–15.5)
WBC: 6.8 10*3/uL (ref 4.0–10.5)

## 2013-09-08 LAB — HEMOGLOBIN A1C
HEMOGLOBIN A1C: 15 % — AB (ref <5.7)
Mean Plasma Glucose: 384 mg/dL — ABNORMAL HIGH (ref <117)

## 2013-09-08 LAB — INFLUENZA PANEL BY PCR (TYPE A & B)
H1N1 flu by pcr: NOT DETECTED
Influenza A By PCR: NEGATIVE
Influenza B By PCR: NEGATIVE

## 2013-09-08 LAB — BASIC METABOLIC PANEL
BUN: 15 mg/dL (ref 6–23)
CHLORIDE: 101 meq/L (ref 96–112)
CO2: 27 mEq/L (ref 19–32)
Calcium: 8.6 mg/dL (ref 8.4–10.5)
Creatinine, Ser: 1 mg/dL (ref 0.50–1.10)
GFR calc Af Amer: 69 mL/min — ABNORMAL LOW (ref >90)
GFR calc non Af Amer: 60 mL/min — ABNORMAL LOW (ref >90)
GLUCOSE: 273 mg/dL — AB (ref 70–99)
Potassium: 3.5 mEq/L — ABNORMAL LOW (ref 3.7–5.3)
SODIUM: 139 meq/L (ref 137–147)

## 2013-09-08 LAB — GLUCOSE, CAPILLARY
Glucose-Capillary: 247 mg/dL — ABNORMAL HIGH (ref 70–99)
Glucose-Capillary: 320 mg/dL — ABNORMAL HIGH (ref 70–99)
Glucose-Capillary: 238 mg/dL — ABNORMAL HIGH (ref 70–99)
Glucose-Capillary: 182 mg/dL — ABNORMAL HIGH (ref 70–99)

## 2013-09-08 LAB — TROPONIN I: Troponin I: <0.3 ng/mL (ref <0.30)

## 2013-09-08 LAB — HEPARIN LEVEL (UNFRACTIONATED): Heparin Unfractionated: 0.26 IU/mL — ABNORMAL LOW (ref 0.30–0.70)

## 2013-09-08 LAB — STREP PNEUMONIAE URINARY ANTIGEN: STREP PNEUMO URINARY ANTIGEN: NEGATIVE

## 2013-09-08 MED ORDER — INSULIN GLARGINE 100 UNIT/ML ~~LOC~~ SOLN
15.0000 [IU] | Freq: Every day | SUBCUTANEOUS | Status: DC
  Administered 2013-09-08: 15 [IU] via SUBCUTANEOUS
  Filled 2013-09-08 (×2): qty 0.15

## 2013-09-08 MED ORDER — POTASSIUM CHLORIDE CRYS ER 20 MEQ PO TBCR
40.0000 meq | EXTENDED_RELEASE_TABLET | Freq: Once | ORAL | Status: AC
  Administered 2013-09-08: 40 meq via ORAL
  Filled 2013-09-08: qty 2

## 2013-09-08 MED ORDER — LIVING WELL WITH DIABETES BOOK
Freq: Once | Status: AC
  Administered 2013-09-08: 10:00:00
  Filled 2013-09-08: qty 1

## 2013-09-08 MED ORDER — BD GETTING STARTED TAKE HOME KIT: 3/10ML X 30G SYRINGES
1.0000 | Freq: Once | Status: AC
  Administered 2013-09-10: 1
  Filled 2013-09-08 (×2): qty 1

## 2013-09-08 MED ORDER — ATORVASTATIN CALCIUM 20 MG PO TABS
20.0000 mg | ORAL_TABLET | Freq: Every day | ORAL | Status: DC
  Administered 2013-09-08 – 2013-09-09 (×2): 20 mg via ORAL
  Filled 2013-09-08 (×3): qty 1

## 2013-09-08 MED ORDER — GUAIFENESIN 100 MG/5ML PO SYRP
200.0000 mg | ORAL_SOLUTION | ORAL | Status: DC | PRN
  Administered 2013-09-08: 200 mg via ORAL
  Filled 2013-09-08: qty 10

## 2013-09-08 MED ORDER — ENOXAPARIN SODIUM 40 MG/0.4ML ~~LOC~~ SOLN
40.0000 mg | SUBCUTANEOUS | Status: DC
  Administered 2013-09-08 – 2013-09-09 (×2): 40 mg via SUBCUTANEOUS
  Filled 2013-09-08 (×3): qty 0.4

## 2013-09-08 MED ORDER — SALINE SPRAY 0.65 % NA SOLN
1.0000 | NASAL | Status: DC | PRN
  Administered 2013-09-08 – 2013-09-09 (×2): 1 via NASAL
  Filled 2013-09-08: qty 44

## 2013-09-08 MED ORDER — OXYMETAZOLINE HCL 0.05 % NA SOLN
1.0000 | Freq: Two times a day (BID) | NASAL | Status: DC
  Administered 2013-09-08 – 2013-09-10 (×5): 1 via NASAL
  Filled 2013-09-08: qty 15

## 2013-09-08 MED ORDER — INSULIN ASPART 100 UNIT/ML ~~LOC~~ SOLN: 0.0000 [IU] | Freq: Every day | SUBCUTANEOUS | Status: DC

## 2013-09-08 MED ORDER — FUROSEMIDE 10 MG/ML IJ SOLN
40.0000 mg | Freq: Once | INTRAMUSCULAR | Status: AC
  Administered 2013-09-08: 40 mg via INTRAVENOUS
  Filled 2013-09-08: qty 4

## 2013-09-08 MED ORDER — INSULIN ASPART 100 UNIT/ML ~~LOC~~ SOLN
3.0000 [IU] | Freq: Three times a day (TID) | SUBCUTANEOUS | Status: DC
  Administered 2013-09-08 (×2): 3 [IU] via SUBCUTANEOUS

## 2013-09-08 MED ORDER — HYDROCHLOROTHIAZIDE 25 MG PO TABS
25.0000 mg | ORAL_TABLET | Freq: Every day | ORAL | Status: DC
  Filled 2013-09-08: qty 1

## 2013-09-08 MED ORDER — DOXYCYCLINE HYCLATE 100 MG PO TABS
100.0000 mg | ORAL_TABLET | Freq: Two times a day (BID) | ORAL | Status: DC
  Administered 2013-09-08 – 2013-09-10 (×5): 100 mg via ORAL
  Filled 2013-09-08 (×6): qty 1

## 2013-09-08 MED ORDER — HEPARIN (PORCINE) IN NACL 100-0.45 UNIT/ML-% IJ SOLN
1450.0000 [IU]/h | INTRAMUSCULAR | Status: DC
  Filled 2013-09-08: qty 250

## 2013-09-08 MED ORDER — INSULIN ASPART 100 UNIT/ML ~~LOC~~ SOLN
0.0000 [IU] | Freq: Three times a day (TID) | SUBCUTANEOUS | Status: DC
  Administered 2013-09-08: 7 [IU] via SUBCUTANEOUS
  Administered 2013-09-08 – 2013-09-09 (×3): 3 [IU] via SUBCUTANEOUS
  Administered 2013-09-09 – 2013-09-10 (×2): 2 [IU] via SUBCUTANEOUS

## 2013-09-08 MED ORDER — INSULIN PEN STARTER KIT: 1.0000 | Freq: Once | Status: DC

## 2013-09-08 MED ORDER — BENZONATATE 100 MG PO CAPS
100.0000 mg | ORAL_CAPSULE | Freq: Three times a day (TID) | ORAL | Status: DC
  Administered 2013-09-08 – 2013-09-10 (×7): 100 mg via ORAL
  Filled 2013-09-08 (×9): qty 1

## 2013-09-08 MED ORDER — FLUTICASONE PROPIONATE 50 MCG/ACT NA SUSP
1.0000 | Freq: Every day | NASAL | Status: DC
  Administered 2013-09-08 – 2013-09-10 (×3): 1 via NASAL
  Filled 2013-09-08: qty 16

## 2013-09-08 NOTE — Progress Notes (Signed)
CARE MANAGEMENT NOTE 09/08/2013  Patient:  SWAY, MANGAR   Account Number:  [id number]  Date Initiated:  09/08/2013  Documentation initiated by:  DAVIS,RHONDA  Subjective/Objective Assessment:   htn and dka noncompliance with home meds.     Action/Plan:   home when stable/diabetic teaching/closer follow up with pcp Hulan Fess   Anticipated DC Date:  09/11/2013   Anticipated DC Plan:  HOME/SELF CARE  In-house referral  NA      DC Planning Services  NA      [facility] Choice  NA   Choice offered to / List presented to:  NA   DME arranged  NA      DME agency  NA     [place] arranged  NA      [place] agency  NA   Status of service:  In process, will continue to follow Medicare Important Message given?  NA - LOS <3 / Initial given by admissions (If response is "NO", the following Medicare IM given date fields will be blank) Date Medicare IM given:   Date Additional Medicare IM given:    Discharge Disposition:    Per UR Regulation:  Reviewed for med. necessity/level of care/duration of stay  If discussed at [place] of Stay Meetings, dates discussed:    Comments:  [id number]/Rhonda Eldridge Dace, BSN, [place] [phone] Chart Reviewed for discharge and hospital needs. Discharge needs at time of review:  None present will follow for needs. Review of patient progress due on [place]:[id number].

## 2013-09-08 NOTE — Progress Notes (Signed)
Inpatient Diabetes Program Recommendations  AACE/ADA: New Consensus Statement on Inpatient Glycemic Control (2013)  Target Ranges:  Prepandial:   less than 140 mg/dL      Peak postprandial:   less than 180 mg/dL (1-2 hours)      Critically ill patients:  140 - 180 mg/dL   Results for Poole, Teresa P (MRN [id number]) as of 09/08/2013 15:43  Ref. Range 09/07/2013 18:35 09/07/2013 19:21 09/07/2013 21:40 09/08/2013 07:49 09/08/2013 12:30  Glucose-Capillary Latest Range: 70-99 mg/dL 233 (H) 210 (H) 252 (H) 247 (H) 320 (H)    Inpatient Diabetes Program Recommendations Correction (SSI): Please increase Novolog correction to resistant scale. Outpatient Referral: Recommend patient follow up with outpatient diabetes education.  Note: Spoke with patient about diabetes and outpatient regimen for diabetes control.  Patient reports that she stopped taking her 4 oral DM medications about 3 or 4 months ago because "her diabetes was in balance".  Patient reports she was taking 4 different oral DM meds but is not sure of the names of the medications.  Discussed A1C results (15.0% on 09/07/13) and explained what an A1C is, basic pathophysiology of DM Type 2, basic home care, importance of checking CBGs and maintaining good CBG control to prevent long-term and short-term complications. Reviewed signs and symptoms of hyperglycemia and hypoglycemia along with treatment for both. Instructed patient on both insulin pens and insulin syringe/vial.  Patient reports that she prefers to use insulin pens.  Educated patient on insulin pen use at home. Reviewed all steps of insulin pen including attachment of needle, 2-unit air shot, dialing up dose, giving injection, removing needle, disposal of sharps, storage of unused insulin, disposal of insulin etc. Patient able to provide successful return demonstration. RNs to provide ongoing basic DM education at bedside with this patient and engage patient to actively check blood glucose and  administer insulin injections.  Have ordered educational booklet, insulin starter kit, and DM videos.  Patient verbalized understanding of information discussed and reports that she has no further questions related to diabetes at this time.    MD to give patient Rxs for insulin pens and insulin pen needles at discharge.   Thanks, Marie , RN, MSN, CCRN Diabetes Coordinator Inpatient Diabetes Program [phone] (Team Pager) [phone] ([facility] office) [phone] ([facility] office)     

## 2013-09-08 NOTE — Progress Notes (Signed)
[facility] PROGRESS NOTE  Teresa Poole [id number] DOB: May 07, 1951 DOA: 09/07/2013 PCP: Gennette Pac, MD  Assessment/Plan  CAP, lingular pneumonia on CXR -  Change to doxycycline for better atypical coverage (QTc prolonged) -  S. pneumo -  Urine legionella -  Flu PCR pending.  If neg, d/c droplet -  Add tessalon  Sinus congestion -  Add nasal saline, flonase, and afrin x 3 days -  Continue abx  Elevated POC troponin.  Rest of troponins via blood test negative.  Possibly some strain from pneumonia or false positive. -  D/c heparin gtt -  Continue ASA, beta blocker -  Add statin -  Per cardiology, outpatient follow up.  No need for further inpatient testing -  ECHO   HTN, noncompliant with medications -  Restart HCTZ tomorrow -  Continue metoprolol -  trend BP and add valsartan if still elevated  Lower extremity edema, suggests diastolic heart failure or venous stasis.  No CKD or hypoalbuminemia.   -  Lasix 40mg  IV once -  Start HCTZ tomorrow  DM, A1c 15, CBG elevated -  Start lantus 15 units -  Add 3 units with meals with low dose SSI -  Educate about diabetes and insulin  -  Nutrition consult for diabetic diet information  Diet:  Diabetic  Access:  PIV IVF:  off Proph:  Lovenox  Code Status: Full Family Communication: patient alone Disposition Plan: pending information about diabetes management with insulin, improved blood pressure control, diuresis today, probably home tomorrow  Consultants:  Cardiology, Dr. Irish Lack  Procedures:  CXR  ECHO  Antibiotics:  Levofloxacin x1  Unasyn 1/15 > 1/16  Doxycycline 1/16 >>    HPI/Subjective:  Persistent cough and SOB.  Fatigued.  Has a lot of sinus congestion.   Objective: Filed Vitals:   09/08/13 0500 09/08/13 0600 09/08/13 0700 09/08/13 0800  BP: 165/69 180/78 134/81 155/78  Pulse: 84 82 89 94  Temp:    97.5 F (36.4 C)  TempSrc:    Oral  Resp: 22 13 17 18   Height:       Weight:      SpO2: 99% 99% 100% 100%    Intake/Output Summary (Last 24 hours) at 09/08/13 0856 Last data filed at 09/08/13 0600  Gross per 24 hour  Intake 1158.35 ml  Output   1000 ml  Net 158.35 ml   Filed Weights   09/07/13 1700 09/08/13 0446  Weight: 96.163 kg (212 lb) 91.2 kg (201 lb 1 oz)    Exam:   General:  Obese AAF, No acute distress  HEENT:  NCAT, MMM  Cardiovascular:  RRR, nl S1, S2 no mrg, 2+ pulses, warm extremities  Respiratory:  Rales anterior left chest, otherwise clear without wheezes or rhonchi, no increased WOB  Abdomen:   NABS, soft, NT/ND  MSK:   Normal tone and bulk, 1+ bilateral LEE  Neuro:  Grossly intact  Data Reviewed: Basic Metabolic Panel:  Recent Labs Lab 09/07/13 1534 09/08/13 0240  NA 136* 139  K 3.9 3.5*  CL 96 101  CO2 25 27  GLUCOSE 460* 273*  BUN 18 15  CREATININE 1.05 1.00  CALCIUM 8.9 8.6   Liver Function Tests: No results found for this basename: AST, ALT, ALKPHOS, BILITOT, PROT, ALBUMIN,  in the last 168 hours No results found for this basename: LIPASE, AMYLASE,  in the last 168 hours No results found for this basename: AMMONIA,  in the last 168 hours CBC:  Recent  Labs Lab 09/07/13 1534 09/08/13 0240  WBC 7.8 6.8  HGB 12.7 11.4*  HCT 37.7 34.7*  MCV 84.2 84.6  PLT 381 359   Cardiac Enzymes:  Recent Labs Lab 09/07/13 1954 09/08/13 0240  TROPONINI <0.30 <0.30   BNP (last 3 results)  Recent Labs  09/07/13 1534  PROBNP 1402.0*   CBG:  Recent Labs Lab 09/07/13 1721 09/07/13 1835 09/07/13 1921 09/07/13 2140 09/08/13 0749  GLUCAP 299* 233* 210* 252* 247*    Recent Results (from the past 240 hour(s))  MRSA PCR SCREENING     Status: None   Collection Time    09/07/13  6:55 PM      Result Value Range Status   MRSA by PCR NEGATIVE  NEGATIVE Final   Comment:            The GeneXpert MRSA Assay (FDA     approved for NASAL specimens     only), is one component of a     comprehensive MRSA  colonization     surveillance program. It is not     intended to diagnose MRSA     infection nor to guide or     monitor treatment for     MRSA infections.     Studies: Dg Chest 2 View  09/07/2013   CLINICAL DATA:  Shortness of breath, cough  EXAM: CHEST  2 VIEW  COMPARISON:  09/07/2013  FINDINGS: Cardiac silhouette is mild-to-moderately enlarged. Mediastinal contours are unremarkable. Ill-defined area of increased density projects in the region of the lingula. This finding is unchanged compared to previous study. No further focal regions of consolidation or focal infiltrates. The osseous structures are unremarkable.  IMPRESSION: Infiltrate versus atelectasis in the region of the lingula.   Electronically Signed   By: Margaree Mackintosh M.D.   On: 09/07/2013 15:55    Scheduled Meds: . amLODipine  5 mg Oral Daily  . ampicillin-sulbactam (UNASYN) IV  1.5 g Intravenous Q6H  . aspirin EC  325 mg Oral Daily  . benzonatate  100 mg Oral TID  . fluticasone  1 spray Each Nare Daily  . [START ON 09/09/2013] hydrochlorothiazide  25 mg Oral Daily  . influenza vac split quadrivalent PF  0.5 mL Intramuscular Tomorrow-1000  . insulin aspart  0-15 Units Subcutaneous TID WC  . metoprolol tartrate  25 mg Oral BID  . oxymetazoline  1 spray Each Nare BID  . pneumococcal 23 valent vaccine  0.5 mL Intramuscular Tomorrow-1000  . sodium chloride  3 mL Intravenous Q12H   Continuous Infusions: . sodium chloride 10 mL/hr at 09/08/13 [id number]    Active Problems:   Cardiac enzymes elevated   NSTEMI (non-ST elevated myocardial infarction)   Uncontrolled hypertension   Diabetes mellitus type II, uncontrolled   Flu-like symptoms   Bronchitis    Time spent: 30 min    Bradin Mcadory, [place] [facility] Pager [phone]. If 7PM-7AM, please contact night-coverage at [email or web address], password [facility] 09/08/2013, 8:56 AM  LOS: 1 day

## 2013-09-08 NOTE — Progress Notes (Addendum)
SUBJECTIVE:  No chest discomfort. She feels like she has sinus congestion. Otherwise, breathing is normal.  OBJECTIVE:   Vitals:   Filed Vitals:   09/08/13 0446 09/08/13 0500 09/08/13 0600 09/08/13 0700  BP:  165/69 180/78 134/81  Pulse:  84 82 89  Temp: 98.1 F (36.7 C)     TempSrc: Oral     Resp:  22 13 17   Height:      Weight: 201 lb 1 oz (91.2 kg)     SpO2:  99% 99% 100%   I&O's:   Intake/Output Summary (Last 24 hours) at 09/08/13 0831 Last data filed at 09/08/13 0600  Gross per 24 hour  Intake 1158.35 ml  Output   1000 ml  Net 158.35 ml   TELEMETRY: Reviewed telemetry pt in normal sinus rhythm:     PHYSICAL EXAM General: Well developed, well nourished, in no acute distress Head:  Normal cephalic and atramatic  Lungs:   Clear bilaterally to auscultation and percussion. Heart:   HRRR S1 S2 .No JVD.   Abdomen:abdomen soft and non-tender  Msk:   Normal strength and tone for age. Extremities:  Trace bilateral lower extremity edema.   Neuro: Alert and oriented X 3. Psych:  Good affect, responds appropriately   LABS: Basic Metabolic Panel:  Recent Labs  09/07/13 1534 09/08/13 0240  NA 136* 139  K 3.9 3.5*  CL 96 101  CO2 25 27  GLUCOSE 460* 273*  BUN 18 15  CREATININE 1.05 1.00  CALCIUM 8.9 8.6   Liver Function Tests: No results found for this basename: AST, ALT, ALKPHOS, BILITOT, PROT, ALBUMIN,  in the last 72 hours No results found for this basename: LIPASE, AMYLASE,  in the last 72 hours CBC:  Recent Labs  09/07/13 1534 09/08/13 0240  WBC 7.8 6.8  HGB 12.7 11.4*  HCT 37.7 34.7*  MCV 84.2 84.6  PLT 381 359   Cardiac Enzymes:  Recent Labs  09/07/13 1954 09/08/13 0240  TROPONINI <0.30 <0.30   BNP: No components found with this basename: POCBNP,  D-Dimer: No results found for this basename: DDIMER,  in the last 72 hours Hemoglobin A1C:  Recent Labs  09/07/13 1542  HGBA1C 15.0*   Fasting Lipid Panel: No results found for this  basename: CHOL, HDL, LDLCALC, TRIG, CHOLHDL, LDLDIRECT,  in the last 72 hours Thyroid Function Tests: No results found for this basename: TSH, T4TOTAL, FREET3, T3FREE, THYROIDAB,  in the last 72 hours Anemia Panel: No results found for this basename: VITAMINB12, FOLATE, FERRITIN, TIBC, IRON, RETICCTPCT,  in the last 72 hours Coag Panel:   Lab Results  Component Value Date   INR 0.89 09/07/2013    RADIOLOGY: Dg Chest 2 View  09/07/2013   CLINICAL DATA:  Shortness of breath, cough  EXAM: CHEST  2 VIEW  COMPARISON:  09/07/2013  FINDINGS: Cardiac silhouette is mild-to-moderately enlarged. Mediastinal contours are unremarkable. Ill-defined area of increased density projects in the region of the lingula. This finding is unchanged compared to previous study. No further focal regions of consolidation or focal infiltrates. The osseous structures are unremarkable.  IMPRESSION: Infiltrate versus atelectasis in the region of the lingula.   Electronically Signed   By: Margaree Mackintosh M.D.   On: 09/07/2013 15:55      ASSESSMENT: Hypertension, abnormal troponin, prior cardiomyopathy  PLAN:  Echocardiogram done at the office in April 2014 showed normal ejection fraction. Her cardiomyopathy  had resolved.  Hypertension: She has not been taking her medications for  the past 3 months. Her blood pressure is much better now.  At home, she was on valsartan HCTZ 320/25 mg, amlodipine 5 mg daily, clonidine 0.2 mg by mouth each bedtime, and bystolic 10 mg daily.  Would add back home therapy as needed. Would only use clonidine if the other 3 medications were not sufficient. Of note, she was also on diabetes medication. She took metformin and glimepiride.  Abnormal troponin: Her regular lab troponins were all normal. The point of care May of this periods. Would stop heparin. She could have an outpatient nuclear stress test. She is currently being treated for pneumonia. She will likely leave the ICU  today.  Jettie Booze., MD  09/08/2013  8:31 AM

## 2013-09-08 NOTE — Progress Notes (Signed)
Attempted to educate patient about caring for herself with diabetes: checking blood sugars/administering insulin. Patient expresses great fear with sticking herself and refuses to administer her own insulin. Says she does a poor job of monitoring blood sugar at home because she cannot stick herself to check her sugar.

## 2013-09-08 NOTE — Progress Notes (Signed)
Nutrition Education Note  RD consulted for nutrition education regarding diabetes.   Lab Results  Component Value Date   HGBA1C 15.0* 09/07/2013    RD provided "Carbohydrate Counting for People with Diabetes" handout from the Academy of Nutrition and Dietetics. Discussed different food groups and their effects on blood sugar, emphasizing carbohydrate-containing foods. Provided list of carbohydrates and recommended serving sizes of common foods.  Discussed importance of controlled and consistent carbohydrate intake throughout the day. Provided examples of ways to balance meals/snacks and encouraged intake of high-fiber, whole grain complex carbohydrates. Teach back method used.  Pt admits to not eating on a regular schedule due to being busy with her job. Drinks only water and hot tea. Discussed importance of getting on a regular meal schedule.   Expect good compliance.  Body mass index is 32.47 kg/(m^2). Pt meets criteria for class I obesity based on current BMI.  Current diet order is CHO medium, patient is consuming approximately 50% of meals at this time. Labs and medications reviewed. No further nutrition interventions warranted at this time. RD contact information provided. If additional nutrition issues arise, please re-consult RD.  Mikey College MS, [place], [place] Pager [phone] After Hours Pager

## 2013-09-08 NOTE — Progress Notes (Signed)
Echocardiogram 2D Echocardiogram has been performed.  Teresa Poole 09/08/2013, 12:25 PM

## 2013-09-08 NOTE — Progress Notes (Signed)
ANTICOAGULATION CONSULT NOTE - F/U Consult  Pharmacy Consult for Heparin Indication: chest pain/ACS  No Known Allergies  Patient Measurements: Height: 5\' 6"  (167.6 cm) (RN estimate) Weight: 212 lb (96.163 kg) (RN estimate) IBW/kg (Calculated) : 59.3 Heparin Dosing Weight: 80kg  Vital Signs: Temp: 98 F (36.7 C) (01/16 0000) Temp src: Oral (01/16 0000) BP: 144/67 mmHg (01/16 0300) Pulse Rate: 78 (01/16 0300)  Labs:  Recent Labs  09/07/13 1534 09/07/13 1954 09/08/13 0240  HGB 12.7  --  11.4*  HCT 37.7  --  34.7*  PLT 381  --  359  APTT  --  26  --   LABPROT  --  11.9  --   INR  --  0.89  --   HEPARINUNFRC  --   --  0.26*  CREATININE 1.05  --  1.00  TROPONINI  --  <0.30 <0.30    Estimated Creatinine Clearance: 69.1 ml/min (by C-G formula based on Cr of 1).   Medical History: Past Medical History  Diagnosis Date  . Diabetes mellitus without complication   . Hypertension   . History of ovarian cancer   . Obesity (BMI 30-39.9)     Medications:  Scheduled:  . amLODipine  5 mg Oral Daily  . ampicillin-sulbactam (UNASYN) IV  1.5 g Intravenous Q6H  . aspirin EC  325 mg Oral Daily  . guaiFENesin  1,200 mg Oral BID  . influenza vac split quadrivalent PF  0.5 mL Intramuscular Tomorrow-1000  . insulin aspart  0-15 Units Subcutaneous TID WC  . metoprolol tartrate  25 mg Oral BID  . pneumococcal 23 valent vaccine  0.5 mL Intramuscular Tomorrow-1000  . sodium chloride  3 mL Intravenous Q12H   Infusions:  . sodium chloride 75 mL/hr at 09/07/13 1830  . heparin 1,450 Units/hr (09/08/13 0356)    Assessment: 63 yo female with DM2 and HTN who has not been taking her meds presents with cough, SOB and chest discomfort. Troponin elevated and T-wave inversions seen on EKG. Cardiology consulted due to concern for demand ischemia vs silent MI. Pharmacy asked to dose IV heparin.  1st HL= 0.26  No problems per RN (except not sure if whole bolus went in)  Goal of Therapy:   Heparin level 0.3-0.7 units/ml Monitor platelets by anticoagulation protocol: Yes   Plan:   Increase heparin drip to 1450 units/hr  Recheck HL in 6 hours ~1000  Daily heparin level and CBC   Lawana Pai R 09/08/2013,4:02 AM

## 2013-09-09 DIAGNOSIS — I509 Heart failure, unspecified: Secondary | ICD-10-CM

## 2013-09-09 DIAGNOSIS — N179 Acute kidney failure, unspecified: Secondary | ICD-10-CM

## 2013-09-09 DIAGNOSIS — I110 Hypertensive heart disease with heart failure: Secondary | ICD-10-CM | POA: Diagnosis present

## 2013-09-09 DIAGNOSIS — I5041 Acute combined systolic (congestive) and diastolic (congestive) heart failure: Secondary | ICD-10-CM | POA: Diagnosis present

## 2013-09-09 DIAGNOSIS — I5021 Acute systolic (congestive) heart failure: Secondary | ICD-10-CM | POA: Diagnosis present

## 2013-09-09 HISTORY — DX: Acute kidney failure, unspecified: N17.9

## 2013-09-09 LAB — LEGIONELLA ANTIGEN, URINE: LEGIONELLA ANTIGEN, URINE: NEGATIVE

## 2013-09-09 LAB — CBC
HEMATOCRIT: 36 % (ref 36.0–46.0)
Hemoglobin: 12 g/dL (ref 12.0–15.0)
MCH: 28.7 pg (ref 26.0–34.0)
MCHC: 33.3 g/dL (ref 30.0–36.0)
MCV: 86.1 fL (ref 78.0–100.0)
Platelets: 345 10*3/uL (ref 150–400)
RBC: 4.18 MIL/uL (ref 3.87–5.11)
RDW: 13.4 % (ref 11.5–15.5)
WBC: 6 10*3/uL (ref 4.0–10.5)

## 2013-09-09 LAB — GLUCOSE, CAPILLARY
Glucose-Capillary: 236 mg/dL — ABNORMAL HIGH (ref 70–99)
Glucose-Capillary: 224 mg/dL — ABNORMAL HIGH (ref 70–99)
Glucose-Capillary: 170 mg/dL — ABNORMAL HIGH (ref 70–99)

## 2013-09-09 LAB — BASIC METABOLIC PANEL
BUN: 24 mg/dL — AB (ref 6–23)
CO2: 24 mEq/L (ref 19–32)
Calcium: 8.4 mg/dL (ref 8.4–10.5)
Chloride: 100 mEq/L (ref 96–112)
Creatinine, Ser: 1.34 mg/dL — ABNORMAL HIGH (ref 0.50–1.10)
GFR, EST AFRICAN AMERICAN: 48 mL/min — AB (ref >90)
GFR, EST NON AFRICAN AMERICAN: 42 mL/min — AB (ref >90)
GLUCOSE: 243 mg/dL — AB (ref 70–99)
Potassium: 4 mEq/L (ref 3.7–5.3)
Sodium: 139 mEq/L (ref 137–147)

## 2013-09-09 MED ORDER — INSULIN ASPART 100 UNIT/ML ~~LOC~~ SOLN
5.0000 [IU] | Freq: Three times a day (TID) | SUBCUTANEOUS | Status: DC
  Administered 2013-09-09 – 2013-09-10 (×4): 5 [IU] via SUBCUTANEOUS

## 2013-09-09 MED ORDER — ISOSORB DINITRATE-HYDRALAZINE 20-37.5 MG PO TABS
1.0000 | ORAL_TABLET | Freq: Two times a day (BID) | ORAL | Status: DC
  Administered 2013-09-09 – 2013-09-10 (×3): 1 via ORAL
  Filled 2013-09-09 (×4): qty 1

## 2013-09-09 MED ORDER — ASPIRIN EC 81 MG PO TBEC
81.0000 mg | DELAYED_RELEASE_TABLET | Freq: Every day | ORAL | Status: DC
  Administered 2013-09-09 – 2013-09-10 (×2): 81 mg via ORAL
  Filled 2013-09-09 (×2): qty 1

## 2013-09-09 MED ORDER — INSULIN GLARGINE 100 UNIT/ML ~~LOC~~ SOLN
25.0000 [IU] | Freq: Every day | SUBCUTANEOUS | Status: DC
  Administered 2013-09-09: 25 [IU] via SUBCUTANEOUS
  Filled 2013-09-09 (×2): qty 0.25

## 2013-09-09 NOTE — Progress Notes (Signed)
Subjective:  Still quite talkative. Her breathing is better than it was on admission.  Objective:  Vital Signs in the last 24 hours: BP 145/75  Pulse 88  Temp(Src) 97.6 F (36.4 C) (Oral)  Resp 20  Ht 5\' 6"  (1.676 m)  Wt 91.2 kg (201 lb 1 oz)  BMI 32.47 kg/m2  SpO2 99%  Physical Exam: Talkative black female who is obese and in no distress Lungs:  Clear  Cardiac:  Regular rhythm, normal S1 and S2, no S3 Abdomen:  Soft, nontender, no masses Extremities:  Trace edema present  Intake/Output from previous day: 01/16 0701 - 01/17 0700 In: 1763.6 [P.O.:1320; I.V.:243.6; IV Piggyback:200] Out: 600 [Urine:600] Weight Filed Weights   09/07/13 1700 09/08/13 0446  Weight: 96.163 kg (212 lb) 91.2 kg (201 lb 1 oz)    Lab Results: Basic Metabolic Panel:  Recent Labs  09/08/13 0240 09/09/13 0505  NA 139 139  K 3.5* 4.0  CL 101 100  CO2 27 24  GLUCOSE 273* 243*  BUN 15 24*  CREATININE 1.00 1.34*    CBC:  Recent Labs  09/08/13 0240 09/09/13 0505  WBC 6.8 6.0  HGB 11.4* 12.0  HCT 34.7* 36.0  MCV 84.6 86.1  PLT 359 345    BNP    Component Value Date/Time   PROBNP 1402.0* 09/07/2013 1534    Telemetry: Sinus rhythm  Echo: Her ejection fraction is 30-35%. According to prior notes it was normal in April of this year.  Assessment/Plan:  1. Cardiomyopathy most likely due to uncontrolled hypertension in the setting of a normal echo earlier this year 2. Hypertensive heart disease with congestive heart failure 3. Poorly controlled diabetes 4. Acute renal failure possibly due either to uncontrolled hypertension or diuresis 5. Medical noncompliance  Recommendations:  At this point she needs to be on a beta blocker and would add oral hydralazine in the form of Bidil.. When her renal function stabilizes consider adding an ACE inhibitor or ARB and the addition of spironolactone.  I would keep her in the hospital another day to monitor her renal  function.      Kerry Hough  MD [facility] Cardiology  09/09/2013, 9:14 AM

## 2013-09-09 NOTE — Progress Notes (Signed)
[facility] PROGRESS NOTE  Teresa Poole KCL:[id number] DOB: 1951/03/11 DOA: 09/07/2013 PCP: Gennette Pac, MD  Assessment/Plan  CAP, lingular -  Continue doxycycline (QTc prolonged) -  S. pneumo neg -  Urine legionella pending -  Flu PCR neg -  Continue tessalon  Sinus congestion -  Continue nasal saline, flonase, and afrin x 3 days -  Continue abx  Elevated POC troponin.  Rest of troponins via blood test negative.  Possibly some strain from pneumonia or false positive. -  Continue ASA, beta blocker, and statin  Nonischemic cardiomyopathy, EF now 30%, likely secondary to uncontrolled hypertension -  On beta blocker -  Would avoid ARB/ACEI given mild AKI -  Start bidil  HTN, BP still elevated -  Hold HCTZ due to AKI -  Continue metoprolol -  Start bidil -  Continue norvasc and metoprolol  Lower extremity edema, suggests diastolic heart failure or venous stasis.  No CKD or hypoalbuminemia.  Slightly improved with diuresis yesterday -  TED hose and elevation as tolerated  DM, A1c 15, CBG still elevated -  Increase to lantus 25 units -  Increase to 5 units with meals with low dose SSI -  Educate about diabetes and insulin  -  Nutrition consult for diabetic diet information -  Monitor CBG today and tomorrow AM to determine further changes in insulin prior to discharge  AKI, likely secondary to diuresis yesterday  -  Hold diuretics and repeat BMP in AM  Diet:  Diabetic  Access:  PIV IVF:  off Proph:  Lovenox  Code Status: Full Family Communication: patient alone Disposition Plan:  Home tomorrow morning if kidney function, blood pressure, and blood sugars improved.    Consultants:  Cardiology, Dr. Irish Lack  Procedures:  CXR  ECHO  Antibiotics:  Levofloxacin x1  Unasyn 1/15 > 1/16  Doxycycline 1/16 >>    HPI/Subjective:  Feels well.  Breathing is better.  She has learned insulin injections with pens and has been giving herself insulin  injections.  Swelling improved.  Denies CP.    Objective: Filed Vitals:   09/08/13 1300 09/08/13 1535 09/08/13 2104 09/09/13 0450  BP: 146/55 150/77 160/86 145/75  Pulse: 80 92 92 88  Temp:  97.9 F (36.6 C) 98.6 F (37 C) 97.6 F (36.4 C)  TempSrc:  Oral Oral Oral  Resp: _0 Height:      Weight:      SpO2: 98% 99% 99% 99%    Intake/Output Summary (Last 24 hours) at 09/09/13 1227 Last data filed at 09/09/13 0300  Gross per 24 hour  Intake    735 ml  Output      0 ml  Net    735 ml   Filed Weights   09/07/13 1700 09/08/13 0446  Weight: 96.163 kg (212 lb) 91.2 kg (201 lb 1 oz)    Exam:   General:  Obese AAF, No acute distress  HEENT:  NCAT, MMM  Cardiovascular:  RRR, nl S1, S2 no mrg, 2+ pulses, warm extremities  Respiratory:  CTAB, no increased WOB  Abdomen:   NABS, soft, NT/ND  MSK:   Normal tone and bulk, trace bilateral LEE  Neuro:  Grossly intact  Psych:  Mildly pressured speech  Data Reviewed: Basic Metabolic Panel:  Recent Labs Lab 09/07/13 1534 09/08/13 0240 09/09/13 0505  NA 136* 139 139  K 3.9 3.5* 4.0  CL 96 101 100  CO2 _1 GLUCOSE 460* 273* 243*  BUN 18 15 24*  CREATININE 1.05 1.00 1.34*  CALCIUM 8.9 8.6 8.4   Liver Function Tests: No results found for this basename: AST, ALT, ALKPHOS, BILITOT, PROT, ALBUMIN,  in the last 168 hours No results found for this basename: LIPASE, AMYLASE,  in the last 168 hours No results found for this basename: AMMONIA,  in the last 168 hours CBC:  Recent Labs Lab 09/07/13 1534 09/08/13 0240 09/09/13 0505  WBC 7.8 6.8 6.0  HGB 12.7 11.4* 12.0  HCT 37.7 34.7* 36.0  MCV 84.2 84.6 86.1  PLT 381 359 345   Cardiac Enzymes:  Recent Labs Lab 09/07/13 1954 09/08/13 0240 09/08/13 0934  TROPONINI <0.30 <0.30 <0.30   BNP (last 3 results)  Recent Labs  09/07/13 1534  PROBNP 1402.0*   CBG:  Recent Labs Lab 09/08/13 1230 09/08/13 1700 09/08/13 2101 09/09/13 0730  09/09/13 1139  GLUCAP 320* 238* 182* 236* 224*    Recent Results (from the past 240 hour(s))  MRSA PCR SCREENING     Status: None   Collection Time    09/07/13  6:55 PM      Result Value Range Status   MRSA by PCR NEGATIVE  NEGATIVE Final   Comment:            The GeneXpert MRSA Assay (FDA     approved for NASAL specimens     only), is one component of a     comprehensive MRSA colonization     surveillance program. It is not     intended to diagnose MRSA     infection nor to guide or     monitor treatment for     MRSA infections.     Studies: Dg Chest 2 View  09/07/2013   CLINICAL DATA:  Shortness of breath, cough  EXAM: CHEST  2 VIEW  COMPARISON:  09/07/2013  FINDINGS: Cardiac silhouette is mild-to-moderately enlarged. Mediastinal contours are unremarkable. Ill-defined area of increased density projects in the region of the lingula. This finding is unchanged compared to previous study. No further focal regions of consolidation or focal infiltrates. The osseous structures are unremarkable.  IMPRESSION: Infiltrate versus atelectasis in the region of the lingula.   Electronically Signed   By: Margaree Mackintosh M.D.   On: 09/07/2013 15:55    Scheduled Meds: . amLODipine  5 mg Oral Daily  . aspirin EC  81 mg Oral Daily  . atorvastatin  20 mg Oral q1800  . bd getting started take home kit  1 kit Other Once  . benzonatate  100 mg Oral TID  . doxycycline  100 mg Oral Q12H  . enoxaparin (LOVENOX) injection  40 mg Subcutaneous Q24H  . fluticasone  1 spray Each Nare Daily  . insulin aspart  0-5 Units Subcutaneous QHS  . insulin aspart  0-9 Units Subcutaneous TID WC  . insulin aspart  5 Units Subcutaneous TID WC  . insulin glargine  25 Units Subcutaneous Daily  . isosorbide-hydrALAZINE  1 tablet Oral BID  . metoprolol tartrate  25 mg Oral BID  . oxymetazoline  1 spray Each Nare BID  . sodium chloride  3 mL Intravenous Q12H   Continuous Infusions: . sodium chloride 10 mL/hr at  09/08/13 0263    Principal Problem:   Uncontrolled hypertension Active Problems:   Diabetes mellitus type II, uncontrolled   Flu-like symptoms   Bronchitis   Acute systolic congestive heart failure   Hypertensive heart disease with CHF (congestive heart failure)  Time spent: 30 min    Denver Bentson, [facility] Pager [phone]. If 7PM-7AM, please contact night-coverage at [email or web address], password [facility] 09/09/2013, 12:27 PM  LOS: 2 days

## 2013-09-09 NOTE — Discharge Summary (Addendum)
Physician Discharge Summary  Teresa Poole [id number] DOB: Oct 07, 1950 DOA: 09/07/2013  PCP: Gennette Pac, MD  Admit date: 09/07/2013 Discharge date: 09/10/2013  Recommendations for Outpatient Follow-up:  1. Follow up with primary care doctor within 1 week of discharge for review of blood pressure and diabetes.  Consider outpatient sleep study.  Check BMP to evaluation kidney function.  Consider ACEI if creatinine back to baseline.  F/u final report of blood culture.   2. Cardiology within 2 weeks for follow up heart failure, high blood pressure  Discharge Diagnoses:  Principal Problem:   CAP (community acquired pneumonia) Active Problems:   Uncontrolled hypertension   Diabetes mellitus type II, uncontrolled   Acute systolic congestive heart failure   Hypertensive heart disease with CHF (congestive heart failure)   AKI (acute kidney injury)   Discharge Condition: stable, improved  Diet recommendation: diabetic, 2gm sodium   Wt Readings from Last 3 Encounters:  09/08/13 91.2 kg (201 lb 1 oz)  07/17/12 96.163 kg (212 lb)    History of present illness:  63 year old obese female with history of hypertension, diabetes mellitus (has not been taking any of her medications for over 3 months for reasons unclear) has been having symptoms of cough with whitish to yellowish phlegm for possible 3 weeks. She also reports fatigue with these symptoms. Today when she went to work she felt extremely tired and [name] of breath on minimal exertion. She reports that she had to hold onto the wall in order to walk. She also felt chest heaviness and went to see the PCP. At the PCP office she was found to be hypertensive and hyperglycemic and was told to come to the ED. Patient denies any headache, blurred vision, dizziness, nausea, vomiting, orthopnea, PND, abdominal pain, subjective fever or chills, bowel or urinary symptoms. She does report polyuria but denies polydipsia. She reports driving to  [place] 3 weeks back. She does report having mild leg swellings which is chronic.   Hospital Course:  CAP, lingular, continue doxycyline to complete a 7 day course.   - S. pneumo neg  - Urine legionella pending  - Flu PCR neg  - Continue tessalon prn  Sinus congestion, likely due to URI.  Continue nasal saline and afrin as needed (limited use of afrin due to risk of rebound).  Consider flonase if not improving over time.     Elevated POC troponin. Rest of troponins via blood test negative. Possibly some strain from pneumonia or false positive.  Not an NSTEMI per cardiology.  Continue ASA, beta blocker, and statin.  Nonischemic cardiomyopathy, EF now 30%, likely secondary to uncontrolled hypertension.  Started on beta blocker, however, not started on ARB/ACEI secondary to AKI.  She was started on bidil.  Will give Rx for low dose lasix to use as needed for now given recovery of kidney function.  PCP to check electrolytes and weight in 1 week.     HTN, BP still elevated.  Hold HCTZ due to AKI.  Continue metoprolol, norvasc, and bidil.    Lower extremity edema, suggests diastolic heart failure or venous stasis. No CKD or hypoalbuminemia. Slightly improved with diuresis yesterday  TED hose and elevation as tolerated.   DM, A1c 15, CBG still elevated.  Increase to lantus 30 units and will continue SSI with meals.  She was given information about diabetic diet, diabetes management, and she learned how to do CBG checks and administer insulin on her own.    AKI, likely secondary to diuresis yesterday.  Started resolving spontaneously.  Creatinine trending down.     Consultants:  Cardiology, Dr. Irish Lack Procedures:  CXR  ECHO Antibiotics:  Levofloxacin x1  Unasyn 1/15 > 1/16  Doxycycline 1/16 >>   Discharge Exam: Filed Vitals:   09/10/13 0456  BP: 143/74  Pulse: 86  Temp: 98.2 F (36.8 C)  Resp: 17   Filed Vitals:   09/09/13 0450 09/09/13 1439 09/09/13 2104 09/10/13 0456  BP:  145/75 127/60 153/81 143/74  Pulse: 88 90 96 86  Temp: 97.6 F (36.4 C) 97.9 F (36.6 C) 98.7 F (37.1 C) 98.2 F (36.8 C)  TempSrc: Oral Oral Oral Oral  Resp: 20 16 17 17   Height:      Weight:      SpO2: 99% 100% 100% 100%    General: Obese AAF, No acute distress  HEENT: NCAT, MMM  Cardiovascular: RRR, nl S1, S2 no mrg, 2+ pulses, warm extremities  Respiratory: CTAB, no increased WOB  Abdomen: NABS, soft, NT/ND  MSK: Normal tone and bulk, 1+ bilateral LEE  Neuro: Grossly intact  Psych: Mildly pressured speech   Discharge Instructions      Discharge Orders   Future Appointments Provider Department Dept Phone   01/02/2014 11:00 AM Casandra Doffing, MD [facility] [phone]   Future Orders Complete By Expires   Ambulatory referral to Nutrition and Diabetic Education  As directed    Comments:     A1C 15.0 on 09/07/12; new to insulin   Call MD for:  difficulty breathing, headache or visual disturbances  As directed    Call MD for:  extreme fatigue  As directed    Call MD for:  hives  As directed    Call MD for:  persistant dizziness or light-headedness  As directed    Call MD for:  persistant nausea and vomiting  As directed    Call MD for:  severe uncontrolled pain  As directed    Call MD for:  temperature >100.4  As directed    Diet - low sodium heart healthy  As directed    Diet Carb Modified  As directed    Discharge instructions  As directed    Comments:     You were hospitalized with shortness of breath and flu-like symptoms and were found to have a pneumonia of the left lung.  Please continue your antibiotics twice daily until they are completely gone.  You were found to have high blood pressure and uncontrolled diabetes which likely caused you to have heart failure.  Please take metoprolol, bidil, and norvasc for your blood pressure.  For your diabetes, please check your sugar 4 times a day, before breakfast, lunch, dinner, and bed and record them so  that your primary care doctor may review them.  Please give yourself lantus 30 units once daily and use the sliding scale to administer Teresa Poole-acting insulin aspart before your meals (amount based on your blood sugar).  For your heart failure, please weigh yourself daily and if you gain more than 3-lbs in one day or 5-lbs in 1 week, start taking lasix daily.  Make sure you see your primary care doctor in 1 week for review of your blood sugars and check your kidney function.  Ask about sleep study referral.  Cardiology follow up in 2 weeks to review blood pressure, heart failure.   Increase activity slowly  As directed        Medication List    STOP taking these medications  DAYQUIL PO     sodium-potassium bicarbonate Tbef dissolvable tablet  Commonly known as:  ALKA-SELTZER GOLD      TAKE these medications       amLODipine 5 MG tablet  Commonly known as:  NORVASC  Take 1 tablet (5 mg total) by mouth daily.     aspirin 81 MG EC tablet  Take 1 tablet (81 mg total) by mouth daily.     atorvastatin 20 MG tablet  Commonly known as:  LIPITOR  Take 1 tablet (20 mg total) by mouth daily at 6 PM.     benzonatate 100 MG capsule  Commonly known as:  TESSALON  Take 1 capsule (100 mg total) by mouth 3 (three) times daily as needed for cough.     doxycycline 100 MG tablet  Commonly known as:  VIBRA-TABS  Take 1 tablet (100 mg total) by mouth every 12 (twelve) hours.     furosemide 20 MG tablet  Commonly known as:  LASIX  Take 1 tablet (20 mg total) by mouth daily as needed (weight gain 3-lbs in 1 day or 5-lbs in one week).     insulin aspart 100 UNIT/ML FlexPen  Commonly known as:  NOVOLOG  CBG 70-120: 5 units, CBG 121-150: 6 unit, CBG 151-200: 7 unit, CBG 201-250: 8 unit, CBG 251-300: 10, CBG 301-350: 12, CBG > 350: 14 units     Insulin Glargine 100 UNIT/ML Solostar Pen  Commonly known as:  LANTUS SOLOSTAR  Inject 30 Units into the skin daily.     isosorbide-hydrALAZINE 20-37.5  MG per tablet  Commonly known as:  BIDIL  Take 2 tablets by mouth 2 (two) times daily.     metoprolol tartrate 25 MG tablet  Commonly known as:  LOPRESSOR  Take 1 tablet (25 mg total) by mouth 2 (two) times daily.     MUCINEX PO  Take 1 tablet by mouth daily as needed (cold).     nitroGLYCERIN 0.4 MG SL tablet  Commonly known as:  NITROSTAT  Place 1 tablet (0.4 mg total) under the tongue every 5 (five) minutes as needed for chest pain.     sodium chloride 0.65 % Soln nasal spray  Commonly known as:  OCEAN  Place 1 spray into both nostrils as needed for congestion.       Follow-up Information   Follow up with Gennette Pac, MD. Schedule an appointment as soon as possible for a visit in 1 week.   Specialty:  Family Medicine   Contact information:   [place] [address] [phone]       Follow up with Jettie Booze., MD. Schedule an appointment as soon as possible for a visit in 2 weeks.   Specialty:  Cardiology   Contact information:   [id number] [place] [address] [phone]       The results of significant diagnostics from this hospitalization (including imaging, microbiology, ancillary and laboratory) are listed below for reference.    Significant Diagnostic Studies: Dg Chest 2 View  09/07/2013   CLINICAL DATA:  Shortness of breath, cough  EXAM: CHEST  2 VIEW  COMPARISON:  09/07/2013  FINDINGS: Cardiac silhouette is mild-to-moderately enlarged. Mediastinal contours are unremarkable. Ill-defined area of increased density projects in the region of the lingula. This finding is unchanged compared to previous study. No further focal regions of consolidation or focal infiltrates. The osseous structures are unremarkable.  IMPRESSION: Infiltrate versus atelectasis in the region of the lingula.  Electronically Signed   By: Margaree Mackintosh M.D.   On: 09/07/2013 15:55    Microbiology: Recent Results (from the past 240  hour(s))  MRSA PCR SCREENING     Status: None   Collection Time    09/07/13  6:55 PM      Result Value Range Status   MRSA by PCR NEGATIVE  NEGATIVE Final   Comment:            The GeneXpert MRSA Assay (FDA     approved for NASAL specimens     only), is one component of a     comprehensive MRSA colonization     surveillance program. It is not     intended to diagnose MRSA     infection nor to guide or     monitor treatment for     MRSA infections.  CULTURE, BLOOD (ROUTINE X 2)     Status: None   Collection Time    09/07/13  7:47 PM      Result Value Range Status   Specimen Description BLOOD RIGHT ARM   Final   Special Requests BOTTLES DRAWN AEROBIC ONLY 5CC   Final   Culture  Setup Time     Final   Value: 09/08/2013 02:24     Performed at [facility]   Culture     Final   Value:        BLOOD CULTURE RECEIVED NO GROWTH TO DATE CULTURE WILL BE HELD FOR 5 DAYS BEFORE ISSUING A FINAL NEGATIVE REPORT     Performed at [facility]   Report Status PENDING   Incomplete  CULTURE, BLOOD (ROUTINE X 2)     Status: None   Collection Time    09/07/13  7:52 PM      Result Value Range Status   Specimen Description BLOOD RIGHT HAND   Final   Special Requests BOTTLES DRAWN AEROBIC ONLY 3CC   Final   Culture  Setup Time     Final   Value: 09/08/2013 04:23     Performed at [facility]   Culture     Final   Value:        BLOOD CULTURE RECEIVED NO GROWTH TO DATE CULTURE WILL BE HELD FOR 5 DAYS BEFORE ISSUING A FINAL NEGATIVE REPORT     Performed at [facility]   Report Status PENDING   Incomplete     Labs: Basic Metabolic Panel:  Recent Labs Lab 09/07/13 1534 09/08/13 0240 09/09/13 0505 09/10/13 0535  NA 136* 139 139 138  K 3.9 3.5* 4.0 4.2  CL 96 101 100 101  CO2 25 27 24 25   GLUCOSE 460* 273* 243* 223*  BUN 18 15 24* 27*  CREATININE 1.05 1.00 1.34* 1.29*  CALCIUM 8.9 8.6 8.4 8.5   Liver Function Tests: No results found for this basename:  AST, ALT, ALKPHOS, BILITOT, PROT, ALBUMIN,  in the last 168 hours No results found for this basename: LIPASE, AMYLASE,  in the last 168 hours No results found for this basename: AMMONIA,  in the last 168 hours CBC:  Recent Labs Lab 09/07/13 1534 09/08/13 0240 09/09/13 0505 09/10/13 0535  WBC 7.8 6.8 6.0 6.5  HGB 12.7 11.4* 12.0 11.1*  HCT 37.7 34.7* 36.0 32.6*  MCV 84.2 84.6 86.1 86.7  PLT 381 359 345 318   Cardiac Enzymes:  Recent Labs Lab 09/07/13 1954 09/08/13 0240 09/08/13 0934  TROPONINI <0.30 <0.30 <0.30   BNP:  BNP (last 3 results)  Recent Labs  09/07/13 1534  PROBNP 1402.0*   CBG:  Recent Labs Lab 09/09/13 0730 09/09/13 1139 09/09/13 1633 09/09/13 2107 09/10/13 0729  GLUCAP 236* 224* 170* 168* 197*    Time coordinating discharge: 45 minutes  Signed:  Alphons Burgert  [facility] 09/10/2013, 9:30 AM

## 2013-09-10 DIAGNOSIS — N179 Acute kidney failure, unspecified: Secondary | ICD-10-CM

## 2013-09-10 DIAGNOSIS — J189 Pneumonia, unspecified organism: Secondary | ICD-10-CM

## 2013-09-10 LAB — BASIC METABOLIC PANEL
BUN: 27 mg/dL — AB (ref 6–23)
CHLORIDE: 101 meq/L (ref 96–112)
CO2: 25 mEq/L (ref 19–32)
Calcium: 8.5 mg/dL (ref 8.4–10.5)
Creatinine, Ser: 1.29 mg/dL — ABNORMAL HIGH (ref 0.50–1.10)
GFR calc Af Amer: 51 mL/min — ABNORMAL LOW (ref >90)
GFR calc non Af Amer: 44 mL/min — ABNORMAL LOW (ref >90)
GLUCOSE: 223 mg/dL — AB (ref 70–99)
Potassium: 4.2 mEq/L (ref 3.7–5.3)
Sodium: 138 mEq/L (ref 137–147)

## 2013-09-10 LAB — CBC
HEMATOCRIT: 32.6 % — AB (ref 36.0–46.0)
Hemoglobin: 11.1 g/dL — ABNORMAL LOW (ref 12.0–15.0)
MCH: 29.5 pg (ref 26.0–34.0)
MCHC: 34 g/dL (ref 30.0–36.0)
MCV: 86.7 fL (ref 78.0–100.0)
Platelets: 318 10*3/uL (ref 150–400)
RBC: 3.76 MIL/uL — ABNORMAL LOW (ref 3.87–5.11)
RDW: 13.3 % (ref 11.5–15.5)
WBC: 6.5 10*3/uL (ref 4.0–10.5)

## 2013-09-10 LAB — GLUCOSE, CAPILLARY
Glucose-Capillary: 168 mg/dL — ABNORMAL HIGH (ref 70–99)
Glucose-Capillary: 197 mg/dL — ABNORMAL HIGH (ref 70–99)

## 2013-09-10 MED ORDER — AMLODIPINE BESYLATE 5 MG PO TABS: 5.0000 mg | ORAL_TABLET | Freq: Every day | ORAL | Status: DC

## 2013-09-10 MED ORDER — ASPIRIN 81 MG PO TBEC: 81.0000 mg | DELAYED_RELEASE_TABLET | Freq: Every day | ORAL | Status: DC

## 2013-09-10 MED ORDER — SALINE SPRAY 0.65 % NA SOLN: 1.0000 | NASAL | Status: DC | PRN

## 2013-09-10 MED ORDER — NITROGLYCERIN 0.4 MG SL SUBL: 0.4000 mg | SUBLINGUAL_TABLET | SUBLINGUAL | Status: DC | PRN

## 2013-09-10 MED ORDER — ATORVASTATIN CALCIUM 20 MG PO TABS: 20.0000 mg | ORAL_TABLET | Freq: Every day | ORAL | Status: DC

## 2013-09-10 MED ORDER — GUAIFENESIN-CODEINE 100-10 MG/5ML PO SOLN: 5.0000 mL | ORAL | Status: DC | PRN

## 2013-09-10 MED ORDER — INSULIN GLARGINE 100 UNIT/ML SOLOSTAR PEN: 30.0000 [IU] | PEN_INJECTOR | Freq: Every day | SUBCUTANEOUS | Status: DC

## 2013-09-10 MED ORDER — BENZONATATE 100 MG PO CAPS: 100.0000 mg | ORAL_CAPSULE | Freq: Three times a day (TID) | ORAL | Status: DC | PRN

## 2013-09-10 MED ORDER — METOPROLOL TARTRATE 25 MG PO TABS: 25.0000 mg | ORAL_TABLET | Freq: Two times a day (BID) | ORAL | Status: DC

## 2013-09-10 MED ORDER — INSULIN ASPART 100 UNIT/ML FLEXPEN: PEN_INJECTOR | SUBCUTANEOUS | Status: DC

## 2013-09-10 MED ORDER — INSULIN GLARGINE 100 UNIT/ML ~~LOC~~ SOLN
30.0000 [IU] | Freq: Every day | SUBCUTANEOUS | Status: DC
  Administered 2013-09-10: 30 [IU] via SUBCUTANEOUS
  Filled 2013-09-10: qty 0.3

## 2013-09-10 MED ORDER — DOXYCYCLINE HYCLATE 100 MG PO TABS: 100.0000 mg | ORAL_TABLET | Freq: Two times a day (BID) | ORAL | Status: DC

## 2013-09-10 MED ORDER — FUROSEMIDE 20 MG PO TABS: 20.0000 mg | ORAL_TABLET | Freq: Every day | ORAL | Status: DC | PRN

## 2013-09-10 MED ORDER — ISOSORB DINITRATE-HYDRALAZINE 20-37.5 MG PO TABS: 2.0000 | ORAL_TABLET | Freq: Two times a day (BID) | ORAL | Status: DC

## 2013-09-10 NOTE — Progress Notes (Signed)
Subjective:  Breathing is fine and her renal function has stabilized. She has no shortness of breath or chest pain.  Objective:  Vital Signs in the last 24 hours: BP 143/74  Pulse 86  Temp(Src) 98.2 F (36.8 C) (Oral)  Resp 17  Ht 5\' 6"  (1.676 m)  Wt 91.2 kg (201 lb 1 oz)  BMI 32.47 kg/m2  SpO2 100%  Physical Exam: Talkative black female who is obese and in no distress Lungs:  Clear  Cardiac:  Regular rhythm, normal S1 and S2, no S3 Extremities:  Trace edema present  Intake/Output from previous day: 01/17 0701 - 01/18 0700 In: 840 [P.O.:840] Out: -  Weight Filed Weights   09/07/13 1700 09/08/13 0446  Weight: 96.163 kg (212 lb) 91.2 kg (201 lb 1 oz)    Lab Results: Basic Metabolic Panel:  Recent Labs  09/09/13 0505 09/10/13 0535  NA 139 138  K 4.0 4.2  CL 100 101  CO2 24 25  GLUCOSE 243* 223*  BUN 24* 27*  CREATININE 1.34* 1.29*    CBC:  Recent Labs  09/09/13 0505 09/10/13 0535  WBC 6.0 6.5  HGB 12.0 11.1*  HCT 36.0 32.6*  MCV 86.1 86.7  PLT 345 318    BNP    Component Value Date/Time   PROBNP 1402.0* 09/07/2013 1534    Telemetry: Sinus rhythm  Echo: Her ejection fraction is 30-35%. According to prior notes it was normal in April of this year.  Assessment/Plan:  1. Cardiomyopathy most likely due to uncontrolled hypertension in the setting of a normal echo earlier this year 2. Hypertensive heart disease with congestive heart failure 3. Poorly controlled diabetes 4. Acute renal failure possibly due either to uncontrolled hypertension or diuresis-improving 5. Medical noncompliance  Recommendations:  Her renal function has stabilized and I think she can go home today since her blood pressure is better controlled. She will need to followup in a week or 2 with the cardiologist as well as her family physician.      Kerry Hough  MD [facility] Cardiology  09/10/2013, 9:45 AM

## 2013-09-10 NOTE — Progress Notes (Signed)
Pt left at this time with her sister. Alert, oriented, and without c/o. Discharge instructions/presriptions given/explained with pt verbalizing understanding. Followup appointments noted. Pt able to demonstrate insulin injections and blood sugar checks and has been giving her own insulin injections X 2 days. Insulin kit from pharmacy given and explained to pt. Prescriptions/insulin insructions given and explained in detail to pt.  Also, encouraged monitoring of CHF with scales and weight chart.

## 2013-09-14 ENCOUNTER — Encounter: Payer: BC Managed Care – PPO | Admitting: Interventional Cardiology

## 2013-09-14 ENCOUNTER — Ambulatory Visit (INDEPENDENT_AMBULATORY_CARE_PROVIDER_SITE_OTHER): Payer: BC Managed Care – PPO | Admitting: Interventional Cardiology

## 2013-09-14 ENCOUNTER — Encounter: Payer: Self-pay | Admitting: Interventional Cardiology

## 2013-09-14 VITALS — BP 160/92 | HR 104 | Ht 66.0 in | Wt 209.8 lb

## 2013-09-14 DIAGNOSIS — E669 Obesity, unspecified: Secondary | ICD-10-CM

## 2013-09-14 DIAGNOSIS — I509 Heart failure, unspecified: Secondary | ICD-10-CM

## 2013-09-14 DIAGNOSIS — R609 Edema, unspecified: Secondary | ICD-10-CM

## 2013-09-14 DIAGNOSIS — I5021 Acute systolic (congestive) heart failure: Secondary | ICD-10-CM

## 2013-09-14 DIAGNOSIS — I10 Essential (primary) hypertension: Secondary | ICD-10-CM

## 2013-09-14 DIAGNOSIS — I110 Hypertensive heart disease with heart failure: Secondary | ICD-10-CM

## 2013-09-14 HISTORY — DX: Edema, unspecified: R60.9

## 2013-09-14 LAB — BASIC METABOLIC PANEL
BUN: 21 mg/dL (ref 6–23)
CHLORIDE: 108 meq/L (ref 96–112)
CO2: 25 meq/L (ref 19–32)
CREATININE: 1.4 mg/dL — AB (ref 0.4–1.2)
Calcium: 9 mg/dL (ref 8.4–10.5)
GFR: 51.15 mL/min — ABNORMAL LOW (ref >60.00)
Glucose, Bld: 128 mg/dL — ABNORMAL HIGH (ref 70–99)
Potassium: 3.9 mEq/L (ref 3.5–5.1)
Sodium: 143 mEq/L (ref 135–145)

## 2013-09-14 LAB — CULTURE, BLOOD (ROUTINE X 2)
CULTURE: NO GROWTH
Culture: NO GROWTH

## 2013-09-14 NOTE — Progress Notes (Signed)
Patient ID: Teresa Poole, female   DOB: [birth year], 63 y.o.   MRN: [id number]    Teresa Poole, Teresa Poole Teresa Poole, Teresa Poole  [id number] Phone: [phone] Fax:  [phone]  Date:  09/14/2013   ID:  Teresa Poole, Teresa Poole November 17, 1950, MRN [id number]  PCP:  Teresa Pac, MD      History of Present Illness: Teresa Poole is a 63 y.o. female who has had HTN. She has not always taken her meds.  She had a negative stress test a few years ago.    She reports no chest pain or SHOB for quite some time. SHe has had poorly controlled diabetes. EF in 2010 was 55 %. She walks on the weekends without symptoms.EF in 2014 was normal.   She was hospitalized a week ago for [facility].  SHe had not been taking her BP meds for months.  Her EF was found to be 30-35%, likely due to HTN uncontrolled.  She was treated for PNA.  She had some increase in Cr so ACE-I was held.    She has had itching and bruising at her insulin injections sites.  She is being evaluated by her PMD for this.  She did not take her BP meds this AM due to fear that it was from the meds. She has been checking her BP.  Readings at home were in the 150/69 range since leaving the hospital.  This AM was the highest with systolic of 123XX123 mm Hg.     Wt Readings from Last 3 Encounters:  09/14/13 209 lb 12.8 oz (95.165 kg)  09/08/13 201 lb 1 oz (91.2 kg)  07/17/12 212 lb (96.163 kg)     Past Medical History  Diagnosis Date  . Diabetes mellitus without complication   . Hypertension   . History of ovarian cancer   . Obesity (BMI 30-39.9)     Current Outpatient Prescriptions  Medication Sig Dispense Refill  . amLODipine (NORVASC) 5 MG tablet Take 1 tablet (5 mg total) by mouth daily.  30 tablet  0  . aspirin EC 81 MG EC tablet Take 1 tablet (81 mg total) by mouth daily.  90 tablet  0  . atorvastatin (LIPITOR) 20 MG tablet Take 1 tablet (20 mg total) by mouth daily at 6 PM.  30 tablet  0  . BD PEN NEEDLE NANO U/F 32G X 4 MM MISC        . doxycycline (VIBRA-TABS) 100 MG tablet Take 1 tablet (100 mg total) by mouth every 12 (twelve) hours.  10 tablet  0  . furosemide (LASIX) 20 MG tablet Take 1 tablet (20 mg total) by mouth daily as needed (weight gain 3-lbs in 1 day or 5-lbs in one week).  30 tablet  0  . GuaiFENesin (MUCINEX PO) Take 1 tablet by mouth daily as needed (cold).      Marland Kitchen guaiFENesin-codeine 100-10 MG/5ML syrup Take 5 mLs by mouth every 4 (four) hours as needed for cough.  120 mL  0  . insulin aspart (NOVOLOG) 100 UNIT/ML FlexPen CBG 70-120: 5 units, CBG 121-150: 6 unit, CBG 151-200: 7 unit, CBG 201-250: 8 unit, CBG 251-300: 10, CBG 301-350: 12, CBG > 350: 14 units  15 mL  0  . Insulin Glargine (LANTUS SOLOSTAR) 100 UNIT/ML Solostar Pen Inject 30 Units into the skin daily.  15 mL  0  . isosorbide-hydrALAZINE (BIDIL) 20-37.5 MG per tablet Take 2 tablets by mouth 2 (two) times daily.  120 tablet  0  . metoprolol tartrate (LOPRESSOR) 25 MG tablet Take 1 tablet (25 mg total) by mouth 2 (two) times daily.  60 tablet  0  . nitroGLYCERIN (NITROSTAT) 0.4 MG SL tablet Place 1 tablet (0.4 mg total) under the tongue every 5 (five) minutes as needed for chest pain.  30 tablet  0  . sodium chloride (OCEAN) 0.65 % SOLN nasal spray Place 1 spray into both nostrils as needed for congestion.  60 mL  0   No current facility-administered medications for this visit.    Allergies:   No Known Allergies  Social History:  The patient  reports that she has never smoked. She has never used smokeless tobacco. She reports that she does not drink alcohol or use illicit drugs.   Family History:  The patient's family history is not on file.   ROS:  Please see the history of present illness.  No nausea, vomiting.  No fevers, chills.  No focal weakness.  No dysuria.    All other systems reviewed and negative.   PHYSICAL EXAM: VS:  BP 160/92  Pulse 104  Ht 5\' 6"  (1.676 m)  Wt 209 lb 12.8 oz (95.165 kg)  BMI 33.88 kg/m2 Well nourished,  well developed, in no acute distress HEENT: normal Neck: no JVD, no carotid bruits Cardiac:  normal S1, S2; RRR; 2/6 systolic murmur Lungs:  clear to auscultation bilaterally, no wheezing, rhonchi or rales Abd: soft, nontender, no hepatomegaly Ext: no edema Skin: warm and dry Neuro:   no focal abnormalities noted      ASSESSMENT AND PLAN:  Cardiomyopathy  Notes: Left ventricular ejection fraction was quite low by echocardiogram in 2009. Cardiolite in 2010 showed improvement. EF better in 4/14.  EF back down to 30-35% in Jan 2015. She is not having  heart failure symptoms. She does have poorly controlled diabetes. There's been a change in her ejection fraction. currently, she is on an ARB and beta blocker. If her ejection fraction has dropped, would add spironolactone.  Using prn Lasix for weight gain as needed.  She has not taken any recently.   HTN: Would benefit from ACE-I given low EF.  Check BMet.  For now, Continue amlodipine 5 mg daily.   Previously on ramipril 10 mg BID.  Consider changing beta blocker to Coreg.  If renal fxn still impaired, would increase amlodipine to 10 mg dialy.   She has been on this dose in the past without problems.  Stress medication compliance.  Obesity: WEight increased since the hospital.  Talked about diet changes.  Increase walking as well.   Edema: Will continue to watch.  No fluid  Overload affecting breathing.     Preventive Medicine  Adult topics discussed:  Diet: healthy diet, weight loss.  Exercise: 5 days a week, at least 30 minutes of aerobic exercise.      Signed, Mina Marble, MD, [facility] 09/14/2013 9:34 AM

## 2013-09-14 NOTE — Patient Instructions (Signed)
Your physician recommends that you return for lab work today for [employer].  Your physician recommends that you schedule a follow-up appointment in 2-3 week BP f/u with a PA/NP here is our office.

## 2013-09-18 ENCOUNTER — Inpatient Hospital Stay (HOSPITAL_COMMUNITY)
Admission: EM | Admit: 2013-09-18 | Discharge: 2013-09-26 | DRG: 286 | Disposition: A | Discharge status: Home Health | Payer: BC Managed Care – PPO | Attending: Cardiology | Admitting: Cardiology

## 2013-09-18 ENCOUNTER — Emergency Department (HOSPITAL_COMMUNITY): Payer: BC Managed Care – PPO

## 2013-09-18 ENCOUNTER — Encounter (HOSPITAL_COMMUNITY): Payer: Self-pay | Admitting: Emergency Medicine

## 2013-09-18 DIAGNOSIS — Z91199 Patient's noncompliance with other medical treatment and regimen due to unspecified reason: Secondary | ICD-10-CM

## 2013-09-18 DIAGNOSIS — I2510 Atherosclerotic heart disease of native coronary artery without angina pectoris: Secondary | ICD-10-CM | POA: Diagnosis present

## 2013-09-18 DIAGNOSIS — E1122 Type 2 diabetes mellitus with diabetic chronic kidney disease: Secondary | ICD-10-CM | POA: Diagnosis present

## 2013-09-18 DIAGNOSIS — E785 Hyperlipidemia, unspecified: Secondary | ICD-10-CM | POA: Diagnosis present

## 2013-09-18 DIAGNOSIS — E669 Obesity, unspecified: Secondary | ICD-10-CM

## 2013-09-18 DIAGNOSIS — N183 Chronic kidney disease, stage 3 unspecified: Secondary | ICD-10-CM

## 2013-09-18 DIAGNOSIS — IMO0001 Reserved for inherently not codable concepts without codable children: Secondary | ICD-10-CM

## 2013-09-18 DIAGNOSIS — Z6832 Body mass index (BMI) 32.0-32.9, adult: Secondary | ICD-10-CM

## 2013-09-18 DIAGNOSIS — N184 Chronic kidney disease, stage 4 (severe): Secondary | ICD-10-CM

## 2013-09-18 DIAGNOSIS — I160 Hypertensive urgency: Secondary | ICD-10-CM

## 2013-09-18 DIAGNOSIS — Z9119 Patient's noncompliance with other medical treatment and regimen: Secondary | ICD-10-CM

## 2013-09-18 DIAGNOSIS — I509 Heart failure, unspecified: Secondary | ICD-10-CM

## 2013-09-18 DIAGNOSIS — I10 Essential (primary) hypertension: Secondary | ICD-10-CM

## 2013-09-18 DIAGNOSIS — D631 Anemia in chronic kidney disease: Secondary | ICD-10-CM

## 2013-09-18 DIAGNOSIS — IMO0002 Reserved for concepts with insufficient information to code with codable children: Secondary | ICD-10-CM

## 2013-09-18 DIAGNOSIS — R0602 Shortness of breath: Secondary | ICD-10-CM

## 2013-09-18 DIAGNOSIS — I119 Hypertensive heart disease without heart failure: Secondary | ICD-10-CM

## 2013-09-18 DIAGNOSIS — I5043 Acute on chronic combined systolic (congestive) and diastolic (congestive) heart failure: Secondary | ICD-10-CM | POA: Diagnosis present

## 2013-09-18 DIAGNOSIS — R609 Edema, unspecified: Secondary | ICD-10-CM | POA: Diagnosis present

## 2013-09-18 DIAGNOSIS — I130 Hypertensive heart and chronic kidney disease with heart failure and stage 1 through stage 4 chronic kidney disease, or unspecified chronic kidney disease: Principal | ICD-10-CM | POA: Diagnosis present

## 2013-09-18 DIAGNOSIS — I110 Hypertensive heart disease with heart failure: Secondary | ICD-10-CM

## 2013-09-18 DIAGNOSIS — I43 Cardiomyopathy in diseases classified elsewhere: Secondary | ICD-10-CM | POA: Diagnosis present

## 2013-09-18 DIAGNOSIS — N189 Chronic kidney disease, unspecified: Principal | ICD-10-CM

## 2013-09-18 DIAGNOSIS — N179 Acute kidney failure, unspecified: Secondary | ICD-10-CM

## 2013-09-18 DIAGNOSIS — Z794 Long term (current) use of insulin: Secondary | ICD-10-CM

## 2013-09-18 DIAGNOSIS — I5021 Acute systolic (congestive) heart failure: Secondary | ICD-10-CM

## 2013-09-18 DIAGNOSIS — Z8543 Personal history of malignant neoplasm of ovary: Secondary | ICD-10-CM

## 2013-09-18 DIAGNOSIS — E1165 Type 2 diabetes mellitus with hyperglycemia: Secondary | ICD-10-CM | POA: Diagnosis present

## 2013-09-18 DIAGNOSIS — I5041 Acute combined systolic (congestive) and diastolic (congestive) heart failure: Secondary | ICD-10-CM

## 2013-09-18 DIAGNOSIS — J189 Pneumonia, unspecified organism: Secondary | ICD-10-CM

## 2013-09-18 DIAGNOSIS — J3489 Other specified disorders of nose and nasal sinuses: Secondary | ICD-10-CM | POA: Diagnosis present

## 2013-09-18 DIAGNOSIS — D649 Anemia, unspecified: Secondary | ICD-10-CM | POA: Diagnosis present

## 2013-09-18 DIAGNOSIS — I428 Other cardiomyopathies: Secondary | ICD-10-CM

## 2013-09-18 DIAGNOSIS — Z7982 Long term (current) use of aspirin: Secondary | ICD-10-CM

## 2013-09-18 HISTORY — DX: Hypertensive heart disease without heart failure: I11.9

## 2013-09-18 HISTORY — DX: Hypertensive urgency: I16.0

## 2013-09-18 LAB — CBC WITH DIFFERENTIAL/PLATELET
Basophils Absolute: 0 10*3/uL (ref 0.0–0.1)
Basophils Relative: 0 % (ref 0–1)
EOS PCT: 1 % (ref 0–5)
Eosinophils Absolute: 0.1 10*3/uL (ref 0.0–0.7)
HEMATOCRIT: 32 % — AB (ref 36.0–46.0)
HEMOGLOBIN: 10.3 g/dL — AB (ref 12.0–15.0)
LYMPHS ABS: 1.9 10*3/uL (ref 0.7–4.0)
Lymphocytes Relative: 25 % (ref 12–46)
MCH: 28.3 pg (ref 26.0–34.0)
MCHC: 32.2 g/dL (ref 30.0–36.0)
MCV: 87.9 fL (ref 78.0–100.0)
MONOS PCT: 5 % (ref 3–12)
Monocytes Absolute: 0.4 10*3/uL (ref 0.1–1.0)
Neutro Abs: 5.3 10*3/uL (ref 1.7–7.7)
Neutrophils Relative %: 69 % (ref 43–77)
PLATELETS: 333 10*3/uL (ref 150–400)
RBC: 3.64 MIL/uL — AB (ref 3.87–5.11)
RDW: 13.7 % (ref 11.5–15.5)
WBC: 7.6 10*3/uL (ref 4.0–10.5)

## 2013-09-18 LAB — BASIC METABOLIC PANEL
BUN: 22 mg/dL (ref 6–23)
CALCIUM: 9.4 mg/dL (ref 8.4–10.5)
CO2: 22 mEq/L (ref 19–32)
Chloride: 104 mEq/L (ref 96–112)
Creatinine, Ser: 1.08 mg/dL (ref 0.50–1.10)
GFR calc Af Amer: 63 mL/min — ABNORMAL LOW (ref >90)
GFR calc non Af Amer: 54 mL/min — ABNORMAL LOW (ref >90)
GLUCOSE: 179 mg/dL — AB (ref 70–99)
Potassium: 3.7 mEq/L (ref 3.7–5.3)
Sodium: 142 mEq/L (ref 137–147)

## 2013-09-18 LAB — CREATININE, SERUM
Creatinine, Ser: 1.14 mg/dL — ABNORMAL HIGH (ref 0.50–1.10)
GFR calc Af Amer: 59 mL/min — ABNORMAL LOW (ref >90)
GFR calc non Af Amer: 51 mL/min — ABNORMAL LOW (ref >90)

## 2013-09-18 LAB — CBC
HEMATOCRIT: 31.1 % — AB (ref 36.0–46.0)
Hemoglobin: 10 g/dL — ABNORMAL LOW (ref 12.0–15.0)
MCH: 28.2 pg (ref 26.0–34.0)
MCHC: 32.2 g/dL (ref 30.0–36.0)
MCV: 87.9 fL (ref 78.0–100.0)
PLATELETS: 309 10*3/uL (ref 150–400)
RBC: 3.54 MIL/uL — ABNORMAL LOW (ref 3.87–5.11)
RDW: 13.7 % (ref 11.5–15.5)
WBC: 6.2 10*3/uL (ref 4.0–10.5)

## 2013-09-18 LAB — GLUCOSE, CAPILLARY
GLUCOSE-CAPILLARY: 159 mg/dL — AB (ref 70–99)
GLUCOSE-CAPILLARY: 306 mg/dL — AB (ref 70–99)

## 2013-09-18 LAB — PRO B NATRIURETIC PEPTIDE: Pro B Natriuretic peptide (BNP): 1396 pg/mL — ABNORMAL HIGH (ref 0–125)

## 2013-09-18 LAB — TROPONIN I
Troponin I: <0.3 ng/mL (ref <0.30)
Troponin I: <0.3 ng/mL (ref <0.30)

## 2013-09-18 MED ORDER — CARVEDILOL 6.25 MG PO TABS
6.2500 mg | ORAL_TABLET | Freq: Two times a day (BID) | ORAL | Status: DC
  Administered 2013-09-18 – 2013-09-19 (×2): 6.25 mg via ORAL
  Filled 2013-09-18 (×4): qty 1

## 2013-09-18 MED ORDER — SODIUM CHLORIDE 0.9 % IV SOLN: 250.0000 mL | INTRAVENOUS | Status: DC | PRN

## 2013-09-18 MED ORDER — OXYMETAZOLINE HCL 0.05 % NA SOLN
1.0000 | Freq: Every day | NASAL | Status: DC | PRN
  Filled 2013-09-18: qty 15

## 2013-09-18 MED ORDER — AMLODIPINE BESYLATE 10 MG PO TABS
10.0000 mg | ORAL_TABLET | Freq: Every day | ORAL | Status: DC
  Administered 2013-09-18 – 2013-09-26 (×9): 10 mg via ORAL
  Filled 2013-09-18 (×9): qty 1

## 2013-09-18 MED ORDER — GUAIFENESIN-CODEINE 100-10 MG/5ML PO SOLN: 5.0000 mL | ORAL | Status: DC | PRN

## 2013-09-18 MED ORDER — INSULIN ASPART 100 UNIT/ML ~~LOC~~ SOLN
0.0000 [IU] | Freq: Three times a day (TID) | SUBCUTANEOUS | Status: DC
Start: 2013-09-18 — End: 2013-09-26
  Administered 2013-09-18: 11 [IU] via SUBCUTANEOUS
  Administered 2013-09-19: 3 [IU] via SUBCUTANEOUS
  Administered 2013-09-19: 5 [IU] via SUBCUTANEOUS
  Administered 2013-09-20 (×2): 2 [IU] via SUBCUTANEOUS
  Administered 2013-09-20: 3 [IU] via SUBCUTANEOUS
  Administered 2013-09-21 – 2013-09-22 (×4): 2 [IU] via SUBCUTANEOUS
  Administered 2013-09-22: 3 [IU] via SUBCUTANEOUS
  Administered 2013-09-23 – 2013-09-24 (×2): 2 [IU] via SUBCUTANEOUS
  Administered 2013-09-26: 3 [IU] via SUBCUTANEOUS

## 2013-09-18 MED ORDER — INSULIN ASPART 100 UNIT/ML ~~LOC~~ SOLN: 0.0000 [IU] | Freq: Three times a day (TID) | SUBCUTANEOUS | Status: DC

## 2013-09-18 MED ORDER — HEPARIN SODIUM (PORCINE) 5000 UNIT/ML IJ SOLN
5000.0000 [IU] | Freq: Three times a day (TID) | INTRAMUSCULAR | Status: DC
  Administered 2013-09-18 – 2013-09-26 (×22): 5000 [IU] via SUBCUTANEOUS
  Filled 2013-09-18 (×27): qty 1

## 2013-09-18 MED ORDER — ASPIRIN EC 81 MG PO TBEC
81.0000 mg | DELAYED_RELEASE_TABLET | Freq: Every day | ORAL | Status: DC
  Administered 2013-09-19 – 2013-09-26 (×8): 81 mg via ORAL
  Filled 2013-09-18 (×8): qty 1

## 2013-09-18 MED ORDER — INSULIN GLARGINE 100 UNIT/ML ~~LOC~~ SOLN
20.0000 [IU] | Freq: Every day | SUBCUTANEOUS | Status: DC
  Administered 2013-09-19 – 2013-09-26 (×8): 20 [IU] via SUBCUTANEOUS
  Filled 2013-09-18 (×8): qty 0.2

## 2013-09-18 MED ORDER — SODIUM CHLORIDE 0.9 % IJ SOLN
3.0000 mL | INTRAMUSCULAR | Status: DC | PRN
  Administered 2013-09-26: 3 mL via INTRAVENOUS

## 2013-09-18 MED ORDER — ACETAMINOPHEN 325 MG PO TABS: 650.0000 mg | ORAL_TABLET | ORAL | Status: DC | PRN

## 2013-09-18 MED ORDER — ONDANSETRON HCL 4 MG/2ML IJ SOLN: 4.0000 mg | Freq: Four times a day (QID) | INTRAMUSCULAR | Status: DC | PRN

## 2013-09-18 MED ORDER — SODIUM CHLORIDE 0.9 % IJ SOLN
3.0000 mL | Freq: Two times a day (BID) | INTRAMUSCULAR | Status: DC
  Administered 2013-09-18 – 2013-09-26 (×16): 3 mL via INTRAVENOUS

## 2013-09-18 MED ORDER — FLUTICASONE PROPIONATE 50 MCG/ACT NA SUSP
1.0000 | Freq: Every day | NASAL | Status: DC
  Administered 2013-09-19 – 2013-09-24 (×6): 1 via NASAL
  Filled 2013-09-18 (×3): qty 16

## 2013-09-18 MED ORDER — NITROGLYCERIN 0.4 MG SL SUBL: 0.4000 mg | SUBLINGUAL_TABLET | SUBLINGUAL | Status: DC | PRN

## 2013-09-18 MED ORDER — ATORVASTATIN CALCIUM 20 MG PO TABS
20.0000 mg | ORAL_TABLET | Freq: Every day | ORAL | Status: DC
  Administered 2013-09-18 – 2013-09-25 (×8): 20 mg via ORAL
  Filled 2013-09-18 (×10): qty 1

## 2013-09-18 MED ORDER — ISOSORB DINITRATE-HYDRALAZINE 20-37.5 MG PO TABS
2.0000 | ORAL_TABLET | Freq: Two times a day (BID) | ORAL | Status: DC
  Administered 2013-09-18: 2 via ORAL
  Filled 2013-09-18 (×3): qty 2

## 2013-09-18 MED ORDER — FUROSEMIDE 40 MG PO TABS
40.0000 mg | ORAL_TABLET | Freq: Two times a day (BID) | ORAL | Status: DC
  Administered 2013-09-18: 40 mg via ORAL
  Filled 2013-09-18 (×4): qty 1

## 2013-09-18 NOTE — Progress Notes (Signed)
Spoke with Terri in pharmacy regarding patient's signed and held orders.  Explained that orders had been released by ED RN at 1725 and that this RN had just gotten report on pt.  Requested that medications scheduled for 1730 and 1800 please be rescheduled as patient would even not arrive to unit for possibly another 15-20 minutes.  Pharmacy refused to reschedule medications.  If unable to quickly assess patient prior to administering several medications as scheduled, will pass report to night RN regarding those orders.

## 2013-09-18 NOTE — Progress Notes (Signed)
Patient received from ED.  Pt telemetry monitor placed on patient and confirmed with CMT.  Patient in NAD, ordered late tray for patient.  Will give report to night RN.

## 2013-09-18 NOTE — ED Notes (Signed)
Cardiologist at bedside.  

## 2013-09-18 NOTE — ED Provider Notes (Signed)
CSN: [id number]     Arrival date & time 09/18/13  1036 History   First MD Initiated Contact with Patient 09/18/13 1049     Chief Complaint  Patient presents with  . Shortness of Breath   (Consider location/radiation/quality/duration/timing/severity/associated sxs/prior Treatment) HPI  This a 63 year old female with a history of diabetes, hypertension, and cardiomyopathy with an EF of 30-35% on recent echocardiogram in January 2015 who presents with shortness of breath.  Patient initially noted being short of breath in the shower this morning. She states that she thought was the steam. Patient reports dyspnea on exertion while at work this morning.  She states that she got very winded when she got up and went to the bathroom. She denies any chest pain. She was recently seen by cardiology but is unsure whether or not to change any of her medications. She does report worsening bilateral lower ext swelling and orthopnea which is unchanged.  Patient was recently admitted and was found to have a decreased EF as well as pneumonia. She's finished her course of antibiotics. She denies any fevers or cough.  Past Medical History  Diagnosis Date  . Diabetes mellitus without complication   . Hypertension   . History of ovarian cancer   . Obesity (BMI 30-39.9)    Past Surgical History  Procedure Laterality Date  . Abdominal hysterectomy     History reviewed. No pertinent family history. History  Substance Use Topics  . Smoking status: Never Smoker   . Smokeless tobacco: Never Used  . Alcohol Use: No   OB History   Grav Para Term Preterm Abortions TAB SAB Ect Mult Living                 Review of Systems  Constitutional: Negative for fever.  Respiratory: Positive for shortness of breath. Negative for cough, chest tightness and wheezing.   Cardiovascular: Positive for leg swelling. Negative for chest pain.  Gastrointestinal: Negative for nausea, vomiting and abdominal pain.  Genitourinary:  Negative for dysuria.  Musculoskeletal: Negative for back pain.  Skin: Negative for wound.  Neurological: Negative for headaches.  Psychiatric/Behavioral: Negative for confusion.  All other systems reviewed and are negative.    Allergies  Review of patient's allergies indicates no known allergies.  Home Medications   Current Outpatient Rx  Name  Route  Sig  Dispense  Refill  . amLODipine (NORVASC) 5 MG tablet   Oral   Take 1 tablet (5 mg total) by mouth daily.   30 tablet   0   . aspirin EC 81 MG EC tablet   Oral   Take 1 tablet (81 mg total) by mouth daily.   90 tablet   0   . atorvastatin (LIPITOR) 20 MG tablet   Oral   Take 1 tablet (20 mg total) by mouth daily at 6 PM.   30 tablet   0   . BD PEN NEEDLE NANO U/F 32G X 4 MM MISC               . fluticasone (FLONASE) 50 MCG/ACT nasal spray   Each Nare   Place 1 spray into both nostrils daily.         . furosemide (LASIX) 20 MG tablet   Oral   Take 1 tablet (20 mg total) by mouth daily as needed (weight gain 3-lbs in 1 day or 5-lbs in one week).   30 tablet   0   . GuaiFENesin (MUCINEX PO)   Oral  Take 1 tablet by mouth daily as needed (cold).         Marland Kitchen guaiFENesin-codeine 100-10 MG/5ML syrup   Oral   Take 5 mLs by mouth every 4 (four) hours as needed for cough.   120 mL   0   . insulin aspart (NOVOLOG) 100 UNIT/ML injection   Subcutaneous   Inject 3-12 Units into the skin 4 (four) times daily -  before meals and at bedtime. Uses a sliding scale.         . insulin glargine (LANTUS) 100 UNIT/ML injection   Subcutaneous   Inject 20 Units into the skin daily.         . isosorbide-hydrALAZINE (BIDIL) 20-37.5 MG per tablet   Oral   Take 2 tablets by mouth 2 (two) times daily.   120 tablet   0   . metoprolol tartrate (LOPRESSOR) 25 MG tablet   Oral   Take 1 tablet (25 mg total) by mouth 2 (two) times daily.   60 tablet   0   . nitroGLYCERIN (NITROSTAT) 0.4 MG SL tablet   Sublingual    Place 1 tablet (0.4 mg total) under the tongue every 5 (five) minutes as needed for chest pain.   30 tablet   0   . oxymetazoline (AFRIN) 0.05 % nasal spray   Each Nare   Place 1 spray into both nostrils daily as needed for congestion.         . sodium chloride (OCEAN) 0.65 % SOLN nasal spray   Each Nare   Place 1 spray into both nostrils daily as needed for congestion.         Marland Kitchen doxycycline (VIBRA-TABS) 100 MG tablet   Oral   Take 1 tablet (100 mg total) by mouth every 12 (twelve) hours.   10 tablet   0    BP 167/79  Pulse 82  Temp(Src) 98.1 F (36.7 C) (Oral)  Resp 18  SpO2 96% Physical Exam  Nursing note and vitals reviewed. Constitutional: She is oriented to person, place, and time. She appears well-developed and well-nourished. No distress.  HENT:  Head: Normocephalic and atraumatic.  Eyes: Pupils are equal, round, and reactive to light.  Neck: No JVD present.  Cardiovascular: Normal rate, regular rhythm and normal heart sounds.   No murmur heard. Pulmonary/Chest: Effort normal and breath sounds normal. No respiratory distress. She has no wheezes. She has no rales.  Abdominal: Soft. Bowel sounds are normal. There is no tenderness.  Musculoskeletal: She exhibits edema.  Neurological: She is alert and oriented to person, place, and time.  Skin: Skin is warm and dry.  Psychiatric: She has a normal mood and affect.    ED Course  Procedures (including critical care time) Labs Review Labs Reviewed  CBC WITH DIFFERENTIAL - Abnormal; Notable for the following:    RBC 3.64 (*)    Hemoglobin 10.3 (*)    HCT 32.0 (*)    All other components within normal limits  BASIC METABOLIC PANEL - Abnormal; Notable for the following:    Glucose, Bld 179 (*)    GFR calc non Af Amer 54 (*)    GFR calc Af Amer 63 (*)    All other components within normal limits  PRO B NATRIURETIC PEPTIDE - Abnormal; Notable for the following:    Pro B Natriuretic peptide (BNP) 1396.0 (*)     All other components within normal limits  TROPONIN I   Imaging Review Dg Chest 2 View  09/18/2013   CLINICAL DATA:  SHORTNESS OF BREATH  EXAM: CHEST  2 VIEW  COMPARISON:  DG CHEST 2V dated 09/14/2013; DG CHEST 2 VIEW dated 09/07/2013  FINDINGS: Normal cardiac silhouette. There is faint airspace disease in the lingular lobe similar to comparison exam of 09/14/2013 and improved from 09/07/2013. No pleural fluid. No pneumothorax. Degenerative osteophytosis of the thoracic spine.  IMPRESSION: Persistent lingular pneumonia. Recommend follow-up radiographs to ensure resolution   Electronically Signed   By: Suzy Bouchard M.D.   On: 09/18/2013 11:58    EKG Interpretation    Date/Time:  Monday September 18 2013 11:26:51 EST Ventricular Rate:  85 PR Interval:  166 QRS Duration: 99 QT Interval:  418 QTC Calculation: 497 R Axis:   -5 Text Interpretation:  Sinus rhythm Probable left atrial enlargement Poor R wave progression Confirmed by HORTON  MD, COURTNEY ([id number]) on 09/18/2013 11:30:17 AM            MDM  No diagnosis found.   Patient presents with dyspnea on exertion. Recently found to have a decreased EF and was treated for pneumonia. Clinically he has had no fevers. Has 1+ bilateral lower extremity edema. Denies any recent weight gain. Workup is unremarkable including EKG, troponin.  BNP is stable.  X-ray shows persistent lingular pneumonia; however, patient has had a full course of antibiotics and is currently afebrile. I feel this likely represents a pneumonia that has not resolved on x-ray.  Clinical concern for dyspnea on exertion may be an anginal equivalent. Patient did not have any stress testing evaluation during her last admission. Have requested cardiology evaluated the patient.    Merryl Hacker, MD 09/18/13 [phone]

## 2013-09-18 NOTE — Progress Notes (Signed)
UR completed 

## 2013-09-18 NOTE — ED Notes (Addendum)
Pt reports she was hopsitalized 8 days ago for shortness of breath. Reports shortness of breath started this morning while she was getting ready, thought it was due to steam in shower. SOB continued when she arrived at work. Had to have assistance at work due to SOB. Drove herself to ED. At present speaking in full sentences, denies pain. Lung sounds clear.   Reports she saw her pcp on Thursday, xray performed, was told she did not have pneumonia.

## 2013-09-18 NOTE — H&P (Signed)
CARDIOLOGY CONSULT NOTE   Patient ID: Teresa Poole MRN: [id number] DOB/AGE: [birth year] 63 y.o.  Admit date: 09/18/2013  Primary Physician   Gennette Pac, MD Primary Cardiologist  Dr. Irish Lack  Reason for Consultation   SOB  HPI: Teresa Poole is a 63 y.o. female with a history of HLD, uncontrolled HTN, diabetes mellitus and cardiomyopathy with an EF of 30-35% on recent echocardiogram who presents with shortness of breath. She was recently admitted to [facility] from 1/15-1/8 for SOB and was treated for PNA. She had not been taking her BP or DM meds for months and her EF was found to be 30-35%, likely due to uncontrolled HTN. She was treated for PNA. After discharge she felt much better but was taking it easy. Today was the first time she went back to work and resumed her normal routine. Patient reports dyspnea on exertion while at work this morning. She states that she got very winded when she got up to the point that she had to hold on to the walls to keep herself up. She denies any chest pain. She was recently seen by Dr. Irish Lack but is unsure whether or not to change any of her medications. She does report worsening bilateral lower extremity swelling as well as orthopnea and PND which is unchanged. She is scheduled for a sleep apnea work up in the near future. She denies any fevers and has a residual cough with some white sputum. No lightheadedness and dizziness.     Past Medical History  Diagnosis Date  . Diabetes mellitus without complication   . Hypertension   . History of ovarian cancer   . Obesity (BMI 30-39.9)      Past Surgical History  Procedure Laterality Date  . Abdominal hysterectomy      No Known Allergies  I have reviewed the patient's current medications     Prior to Admission medications   Medication Sig Start Date End Date Taking? Authorizing Provider  amLODipine (NORVASC) 5 MG tablet Take 1 tablet (5 mg total) by mouth daily. 09/10/13  Yes  Janece Canterbury, MD  aspirin EC 81 MG EC tablet Take 1 tablet (81 mg total) by mouth daily. 09/10/13  Yes Janece Canterbury, MD  atorvastatin (LIPITOR) 20 MG tablet Take 1 tablet (20 mg total) by mouth daily at 6 PM. 09/10/13  Yes Janece Canterbury, MD  BD PEN NEEDLE NANO U/F 32G X 4 MM MISC  09/10/13  Yes Historical Provider, MD  fluticasone (FLONASE) 50 MCG/ACT nasal spray Place 1 spray into both nostrils daily.   Yes Historical Provider, MD  furosemide (LASIX) 20 MG tablet Take 1 tablet (20 mg total) by mouth daily as needed (weight gain 3-lbs in 1 day or 5-lbs in one week). 09/10/13  Yes Janece Canterbury, MD  GuaiFENesin (MUCINEX PO) Take 1 tablet by mouth daily as needed (cold).   Yes Historical Provider, MD  guaiFENesin-codeine 100-10 MG/5ML syrup Take 5 mLs by mouth every 4 (four) hours as needed for cough. 09/10/13  Yes Janece Canterbury, MD  insulin aspart (NOVOLOG) 100 UNIT/ML injection Inject 3-12 Units into the skin 4 (four) times daily -  before meals and at bedtime. Uses a sliding scale.   Yes Historical Provider, MD  insulin glargine (LANTUS) 100 UNIT/ML injection Inject 20 Units into the skin daily.   Yes Historical Provider, MD  isosorbide-hydrALAZINE (BIDIL) 20-37.5 MG per tablet Take 2 tablets by mouth 2 (two) times daily. 09/10/13  Yes Noah Delaine  Short, MD  metoprolol tartrate (LOPRESSOR) 25 MG tablet Take 1 tablet (25 mg total) by mouth 2 (two) times daily. 09/10/13  Yes Janece Canterbury, MD  nitroGLYCERIN (NITROSTAT) 0.4 MG SL tablet Place 1 tablet (0.4 mg total) under the tongue every 5 (five) minutes as needed for chest pain. 09/10/13  Yes Janece Canterbury, MD  oxymetazoline (AFRIN) 0.05 % nasal spray Place 1 spray into both nostrils daily as needed for congestion.   Yes Historical Provider, MD  sodium chloride (OCEAN) 0.65 % SOLN nasal spray Place 1 spray into both nostrils daily as needed for congestion.   Yes Historical Provider, MD  doxycycline (VIBRA-TABS) 100 MG tablet Take 1 tablet (100  mg total) by mouth every 12 (twelve) hours. 09/10/13   Janece Canterbury, MD     History   Social History  . Marital Status: Single    Spouse Name: N/A    Number of Children: N/A  . Years of Education: N/A   Occupational History  . Not on file.   Social History Main Topics  . Smoking status: Never Smoker   . Smokeless tobacco: Never Used  . Alcohol Use: No  . Drug Use: No  . Sexual Activity: Not on file   Other Topics Concern  . Not on file   Social History Narrative   Works at [employer]    Family Status  Relation Status Death Age  . Mother Alive   . Father Alive   . Sister Alive   . Brother Alive    History reviewed. No pertinent family history.   ROS:  Full 14 point review of systems complete and found to be negative unless listed above.  Physical Exam: Blood pressure 167/79, pulse 82, temperature 98.1 F (36.7 C), temperature source Oral, resp. rate 18, SpO2 96.00%.  General: Well developed, well nourished, female in no acute distress Obese Head: Eyes PERRLA, No xanthomas.   Normocephalic and atraumatic, oropharynx without edema or exudate.  Lungs: CTAB Heart: HRRR S1 S2, no rub/gallop, Heart irregular rate and rhythm with S1, S2  murmur. pulses are 2+ extrem.   Neck: No carotid bruits. No lymphadenopathy.  JVD. Abdomen: Bowel sounds present, abdomen soft and non-tender without masses or hernias noted. Msk:  No spine or cva tenderness. No weakness, no joint deformities or effusions. Extremities: No clubbing or cyanosis.  1+ LE edema.  Neuro: Alert and oriented X 3. No focal deficits noted. Psych:  Good affect, responds appropriately Skin: No rashes or lesions noted.  Labs:   Lab Results  Component Value Date   WBC 7.6 09/18/2013   HGB 10.3* 09/18/2013   HCT 32.0* 09/18/2013   MCV 87.9 09/18/2013   PLT 333 09/18/2013      Recent Labs Lab 09/18/13 1110  NA 142  K 3.7  CL 104  CO2 22  BUN 22  CREATININE 1.08  CALCIUM 9.4  GLUCOSE 179*    Recent Labs   09/18/13 1110  TROPONINI <0.30    Pro B Natriuretic peptide (BNP)  Date/Time Value Range Status  09/18/2013 11:10 AM 1396.0* 0 - 125 pg/mL Final  09/07/2013  3:34 PM 1402.0* 0 - 125 pg/mL Final    Echo: Study Date: 09/08/2013 Study Conclusions - Left ventricle: The cavity size was normal. Wall thickness was increased in a pattern of mild LVH. Systolic function was moderately to severely reduced. The estimated ejection fraction was in the range of 30% to 35%. Wall motion was normal; there were no regional wall motion abnormalities. -  Left atrium: The atrium was mildly dilated. ------------------------------------------------------------ Left ventricle: The cavity size was normal. Wall thickness was increased in a pattern of mild LVH. Systolic function was moderately to severely reduced. The estimated ejection fraction was in the range of 30% to 35%. Wall motion was normal; there were no regional wall motion abnormalities. Early diastolic septal annular tissue Doppler velocities Ea were abnormal. ----------------------------------------------------------- Aortic valve: Trileaflet; normal thickness leaflets. Mobility was not restricted. Doppler: Transvalvular velocity was within the normal range. There was no stenosis. No regurgitation. ------------------------------------------------------------ Aorta: Aortic root: The aortic root was normal in size. ------------------------------------------------------------ Mitral valve: Structurally normal valve. Mobility was not restricted. Doppler: Transvalvular velocity was within the normal range. There was no evidence for stenosis. Trivial regurgitation. Peak gradient: 59mm Hg (D). ------------------------------------------------------------ Left atrium: The atrium was mildly dilated. ------------------------------------------------------------ Right ventricle: The cavity size was normal. Wall thickness was normal. Systolic function was  normal. ------------------------------------------------------------ Pulmonic valve: The valve appears to be grossly normal. Doppler: Transvalvular velocity was within the normal range. There was no evidence for stenosis. ------------------------------------------------------------ Tricuspid valve: Structurally normal valve. Doppler: Transvalvular velocity was within the normal range. No regurgitation. ------------------------------------------------------------ Pulmonary artery: The main pulmonary artery was normal-sized. Systolic pressure was within the normal range. ------------------------------------------------------------ Right atrium: The atrium was normal in size. ------------------------------------------------------------ Pericardium: There was no pericardial effusion. ------------------------------------------------------------ Systemic veins: Inferior vena cava: The vessel was normal in size; the respirophasic diameter changes were in the normal range (= 50%); findings are consistent with normal central venous pressure.   ECG:   HR 85 Sinus rhythm Probable left atrial enlargement Poor R wave progression  Radiology:  Dg Chest 2 View  09/18/2013   CLINICAL DATA:  SHORTNESS OF BREATH  EXAM: CHEST  2 VIEW  COMPARISON:  DG CHEST 2V dated 09/14/2013; DG CHEST 2 VIEW dated 09/07/2013  FINDINGS: Normal cardiac silhouette. There is faint airspace disease in the lingular lobe similar to comparison exam of 09/14/2013 and improved from 09/07/2013. No pleural fluid. No pneumothorax. Degenerative osteophytosis of the thoracic spine.  IMPRESSION: Persistent lingular pneumonia. Recommend follow-up radiographs to ensure resolution    ASSESSMENT AND PLAN:    Active Problems:   Uncontrolled hypertension   Shortness of breath   Diabetes mellitus type II, uncontrolled   Acute systolic congestive heart failure   Hypertensive heart disease with CHF (congestive heart failure)    Edema    SOB --Patient with systolic congestive heart failure: EF 30-35%. BNP 1,396. She has LE edema and weight gain since discharge a little over a week ago.  -- CXR with persistent lingular PNA -- No CP, troponin neg x1 -- Cycle enzymes, strict I/Os, daily weights.   HTN- BP extremely high. Patient reports she has taken all of her medications as directed. She has been taking her pressures at home and they have remained high. Average ~150/80.   DM- was having pain and bruising from insulin. PCP told her to decrease the dose and reffered her to an endocrinologist.   HLD- continue statin    Signed: Ranae Plumber 09/18/2013 3:36 PM  Pager [phone]  I have personally seen and examined this patient with Perry Mount, PA-C. I agree with the assessment and plan as outlined above. Teresa Poole was recently admitted 09/07/13 with dyspnea, found to have elevated BP and reduced LV systolic function ([id number]). This was felt to be secondary to her uncontrolled HTN. She has had no prior cardiac cath. Reported normal stress tests in past. Her BP was controlled during  the last hospitalization. Follow up 09/13/13 with Dr. Irish Lack and doing well. She now presents to the ED with c/o dyspnea and chest tightness while walking at work today. EKG does not show ischemic changes. She is having no active chest pain. First troponin is negative. Her BP is elevated. BNP is elevated. She has mild volume overload on exam.  -Will admit to telemetry unit and cycle cardiac markers -Lasix 40 mg po BID for mild volume overload, combination of diastolic and systolic CHF -Will increase Norvasc to 10 mg po Qdaily and will change Metoprolol to Coreg 6.25 mg po BID for better BP control -She has a cardiomyopathy without prior cath, now with dyspnea and chest pressure with exertion. It has been felt that this is related to her uncontrolled HTN, which is likely. She has multiple risk factors for CAD including  uncontrolled BP, DM, Obesity. I have discussed a cardiac cath during this admission to ultimately exclude CAD as the reason for her cardiomyopathy, dyspnea. She would be agreeable to this. Follow cardiac markers and make a decision regarding cath in am if renal function is stable.  -CXR with persistent lingular airspace disease but improved from 09/07/13. No clinical symptoms of pneumonia (no elevated WBC, cough, decreased breath sounds or rhonci on exam). Will follow.   I would anticipate adjustment of anti-hypertensive meds for better BP control and further discussion regarding cardiac cath in am.   Teresa Poole 5:01 PM 09/18/2013

## 2013-09-18 NOTE — Consult Note (Deleted)
CARDIOLOGY CONSULT NOTE   Patient ID: Teresa Poole MRN: [id number] DOB/AGE: 1951-02-06 63 y.o.  Admit date: 09/18/2013  Primary Physician   Gennette Pac, MD Primary Cardiologist  Dr. Irish Lack  Reason for Consultation   SOB  HPI: Teresa Poole is a 63 y.o. female with a history of HLD, uncontrolled HTN, diabetes mellitus and cardiomyopathy with an EF of 30-35% on recent echocardiogram who presents with shortness of breath. She was recently admitted to [facility] from 1/15-1/8 for SOB and was treated for PNA. She had not been taking her BP or DM meds for months and her EF was found to be 30-35%, likely due to uncontrolled HTN. She was treated for PNA. After discharge she felt much better but was taking it easy. Today was the first time she went back to work and resumed her normal routine. Patient reports dyspnea on exertion while at work this morning. She states that she got very winded when she got up to the point that she had to hold on to the walls to keep herself up. She denies any chest pain. She was recently seen by Dr. Irish Lack but is unsure whether or not to change any of her medications. She does report worsening bilateral lower extremity swelling as well as orthopnea and PND which is unchanged. She is scheduled for a sleep apnea work up in the near future. She denies any fevers and has a residual cough with some white sputum. No lightheadedness and dizziness.     Past Medical History  Diagnosis Date  . Diabetes mellitus without complication   . Hypertension   . History of ovarian cancer   . Obesity (BMI 30-39.9)      Past Surgical History  Procedure Laterality Date  . Abdominal hysterectomy      No Known Allergies  I have reviewed the patient's current medications     Prior to Admission medications   Medication Sig Start Date End Date Taking? Authorizing Provider  amLODipine (NORVASC) 5 MG tablet Take 1 tablet (5 mg total) by mouth daily. 09/10/13  Yes  Janece Canterbury, MD  aspirin EC 81 MG EC tablet Take 1 tablet (81 mg total) by mouth daily. 09/10/13  Yes Janece Canterbury, MD  atorvastatin (LIPITOR) 20 MG tablet Take 1 tablet (20 mg total) by mouth daily at 6 PM. 09/10/13  Yes Janece Canterbury, MD  BD PEN NEEDLE NANO U/F 32G X 4 MM MISC  09/10/13  Yes Historical Provider, MD  fluticasone (FLONASE) 50 MCG/ACT nasal spray Place 1 spray into both nostrils daily.   Yes Historical Provider, MD  furosemide (LASIX) 20 MG tablet Take 1 tablet (20 mg total) by mouth daily as needed (weight gain 3-lbs in 1 day or 5-lbs in one week). 09/10/13  Yes Janece Canterbury, MD  GuaiFENesin (MUCINEX PO) Take 1 tablet by mouth daily as needed (cold).   Yes Historical Provider, MD  guaiFENesin-codeine 100-10 MG/5ML syrup Take 5 mLs by mouth every 4 (four) hours as needed for cough. 09/10/13  Yes Janece Canterbury, MD  insulin aspart (NOVOLOG) 100 UNIT/ML injection Inject 3-12 Units into the skin 4 (four) times daily -  before meals and at bedtime. Uses a sliding scale.   Yes Historical Provider, MD  insulin glargine (LANTUS) 100 UNIT/ML injection Inject 20 Units into the skin daily.   Yes Historical Provider, MD  isosorbide-hydrALAZINE (BIDIL) 20-37.5 MG per tablet Take 2 tablets by mouth 2 (two) times daily. 09/10/13  Yes Noah Delaine  Short, MD  metoprolol tartrate (LOPRESSOR) 25 MG tablet Take 1 tablet (25 mg total) by mouth 2 (two) times daily. 09/10/13  Yes Janece Canterbury, MD  nitroGLYCERIN (NITROSTAT) 0.4 MG SL tablet Place 1 tablet (0.4 mg total) under the tongue every 5 (five) minutes as needed for chest pain. 09/10/13  Yes Janece Canterbury, MD  oxymetazoline (AFRIN) 0.05 % nasal spray Place 1 spray into both nostrils daily as needed for congestion.   Yes Historical Provider, MD  sodium chloride (OCEAN) 0.65 % SOLN nasal spray Place 1 spray into both nostrils daily as needed for congestion.   Yes Historical Provider, MD  doxycycline (VIBRA-TABS) 100 MG tablet Take 1 tablet (100  mg total) by mouth every 12 (twelve) hours. 09/10/13   Janece Canterbury, MD     History   Social History  . Marital Status: Single    Spouse Name: N/A    Number of Children: N/A  . Years of Education: N/A   Occupational History  . Not on file.   Social History Main Topics  . Smoking status: Never Smoker   . Smokeless tobacco: Never Used  . Alcohol Use: No  . Drug Use: No  . Sexual Activity: Not on file   Other Topics Concern  . Not on file   Social History Narrative   Works at [employer]    Family Status  Relation Status Death Age  . Mother Alive   . Father Alive   . Sister Alive   . Brother Alive    History reviewed. No pertinent family history.   ROS:  Full 14 point review of systems complete and found to be negative unless listed above.  Physical Exam: Blood pressure 167/79, pulse 82, temperature 98.1 F (36.7 C), temperature source Oral, resp. rate 18, SpO2 96.00%.  General: Well developed, well nourished, female in no acute distress Obese Head: Eyes PERRLA, No xanthomas.   Normocephalic and atraumatic, oropharynx without edema or exudate.  Lungs: CTAB Heart: HRRR S1 S2, no rub/gallop, Heart irregular rate and rhythm with S1, S2  murmur. pulses are 2+ extrem.   Neck: No carotid bruits. No lymphadenopathy.  JVD. Abdomen: Bowel sounds present, abdomen soft and non-tender without masses or hernias noted. Msk:  No spine or cva tenderness. No weakness, no joint deformities or effusions. Extremities: No clubbing or cyanosis.  1+ LE edema.  Neuro: Alert and oriented X 3. No focal deficits noted. Psych:  Good affect, responds appropriately Skin: No rashes or lesions noted.  Labs:   Lab Results  Component Value Date   WBC 7.6 09/18/2013   HGB 10.3* 09/18/2013   HCT 32.0* 09/18/2013   MCV 87.9 09/18/2013   PLT 333 09/18/2013      Recent Labs Lab 09/18/13 1110  NA 142  K 3.7  CL 104  CO2 22  BUN 22  CREATININE 1.08  CALCIUM 9.4  GLUCOSE 179*    Recent Labs   09/18/13 1110  TROPONINI <0.30    Pro B Natriuretic peptide (BNP)  Date/Time Value Range Status  09/18/2013 11:10 AM 1396.0* 0 - 125 pg/mL Final  09/07/2013  3:34 PM 1402.0* 0 - 125 pg/mL Final    Echo: Study Date: 09/08/2013 Study Conclusions - Left ventricle: The cavity size was normal. Wall thickness was increased in a pattern of mild LVH. Systolic function was moderately to severely reduced. The estimated ejection fraction was in the range of 30% to 35%. Wall motion was normal; there were no regional wall motion abnormalities. -  Left atrium: The atrium was mildly dilated. ------------------------------------------------------------ Left ventricle: The cavity size was normal. Wall thickness was increased in a pattern of mild LVH. Systolic function was moderately to severely reduced. The estimated ejection fraction was in the range of 30% to 35%. Wall motion was normal; there were no regional wall motion abnormalities. Early diastolic septal annular tissue Doppler velocities Ea were abnormal. ----------------------------------------------------------- Aortic valve: Trileaflet; normal thickness leaflets. Mobility was not restricted. Doppler: Transvalvular velocity was within the normal range. There was no stenosis. No regurgitation. ------------------------------------------------------------ Aorta: Aortic root: The aortic root was normal in size. ------------------------------------------------------------ Mitral valve: Structurally normal valve. Mobility was not restricted. Doppler: Transvalvular velocity was within the normal range. There was no evidence for stenosis. Trivial regurgitation. Peak gradient: 27mm Hg (D). ------------------------------------------------------------ Left atrium: The atrium was mildly dilated. ------------------------------------------------------------ Right ventricle: The cavity size was normal. Wall thickness was normal. Systolic function was  normal. ------------------------------------------------------------ Pulmonic valve: The valve appears to be grossly normal. Doppler: Transvalvular velocity was within the normal range. There was no evidence for stenosis. ------------------------------------------------------------ Tricuspid valve: Structurally normal valve. Doppler: Transvalvular velocity was within the normal range. No regurgitation. ------------------------------------------------------------ Pulmonary artery: The main pulmonary artery was normal-sized. Systolic pressure was within the normal range. ------------------------------------------------------------ Right atrium: The atrium was normal in size. ------------------------------------------------------------ Pericardium: There was no pericardial effusion. ------------------------------------------------------------ Systemic veins: Inferior vena cava: The vessel was normal in size; the respirophasic diameter changes were in the normal range (= 50%); findings are consistent with normal central venous pressure.   ECG:   HR 85 Sinus rhythm Probable left atrial enlargement Poor R wave progression  Radiology:  Dg Chest 2 View  09/18/2013   CLINICAL DATA:  SHORTNESS OF BREATH  EXAM: CHEST  2 VIEW  COMPARISON:  DG CHEST 2V dated 09/14/2013; DG CHEST 2 VIEW dated 09/07/2013  FINDINGS: Normal cardiac silhouette. There is faint airspace disease in the lingular lobe similar to comparison exam of 09/14/2013 and improved from 09/07/2013. No pleural fluid. No pneumothorax. Degenerative osteophytosis of the thoracic spine.  IMPRESSION: Persistent lingular pneumonia. Recommend follow-up radiographs to ensure resolution    ASSESSMENT AND PLAN:    Active Problems:   Uncontrolled hypertension   Shortness of breath   Diabetes mellitus type II, uncontrolled   Acute systolic congestive heart failure   Hypertensive heart disease with CHF (congestive heart failure)    Edema    SOB --Patient with systolic congestive heart failure: EF 30-35%. BNP 1,396. She has LE edema and weight gain since discharge a little over a week ago.  -- CXR with persistent lingular PNA -- No CP, troponin neg x1 -- Cycle enzymes, strict I/Os, daily weights.   HTN- BP extremely high. Patient reports she has taken all of her medications as directed. She has been taking her pressures at home and they have remained high. Average ~150/80.   DM- was having pain and bruising from insulin. PCP told her to decrease the dose and reffered her to an endocrinologist.   HLD- continue statin    Signed: Ranae Plumber 09/18/2013 3:36 PM  Pager [phone]  Co-Sign MD

## 2013-09-19 ENCOUNTER — Encounter: Payer: BC Managed Care – PPO | Admitting: Nurse Practitioner

## 2013-09-19 DIAGNOSIS — N179 Acute kidney failure, unspecified: Secondary | ICD-10-CM

## 2013-09-19 DIAGNOSIS — E669 Obesity, unspecified: Secondary | ICD-10-CM

## 2013-09-19 DIAGNOSIS — I509 Heart failure, unspecified: Secondary | ICD-10-CM

## 2013-09-19 DIAGNOSIS — I110 Hypertensive heart disease with heart failure: Secondary | ICD-10-CM

## 2013-09-19 DIAGNOSIS — J189 Pneumonia, unspecified organism: Secondary | ICD-10-CM

## 2013-09-19 LAB — CBC
HCT: 30.6 % — ABNORMAL LOW (ref 36.0–46.0)
HEMOGLOBIN: 10 g/dL — AB (ref 12.0–15.0)
MCH: 28.6 pg (ref 26.0–34.0)
MCHC: 32.7 g/dL (ref 30.0–36.0)
MCV: 87.4 fL (ref 78.0–100.0)
Platelets: 315 10*3/uL (ref 150–400)
RBC: 3.5 MIL/uL — AB (ref 3.87–5.11)
RDW: 13.7 % (ref 11.5–15.5)
WBC: 6.8 10*3/uL (ref 4.0–10.5)

## 2013-09-19 LAB — TROPONIN I
Troponin I: <0.3 ng/mL (ref <0.30)
Troponin I: <0.3 ng/mL (ref <0.30)

## 2013-09-19 LAB — BASIC METABOLIC PANEL
BUN: 22 mg/dL (ref 6–23)
CALCIUM: 9.2 mg/dL (ref 8.4–10.5)
CO2: 24 meq/L (ref 19–32)
CREATININE: 1.24 mg/dL — AB (ref 0.50–1.10)
Chloride: 106 mEq/L (ref 96–112)
GFR calc Af Amer: 53 mL/min — ABNORMAL LOW (ref >90)
GFR calc non Af Amer: 46 mL/min — ABNORMAL LOW (ref >90)
GLUCOSE: 114 mg/dL — AB (ref 70–99)
Potassium: 3.4 mEq/L — ABNORMAL LOW (ref 3.7–5.3)
Sodium: 145 mEq/L (ref 137–147)

## 2013-09-19 LAB — GLUCOSE, CAPILLARY
Glucose-Capillary: 115 mg/dL — ABNORMAL HIGH (ref 70–99)
Glucose-Capillary: 198 mg/dL — ABNORMAL HIGH (ref 70–99)
Glucose-Capillary: 201 mg/dL — ABNORMAL HIGH (ref 70–99)

## 2013-09-19 MED ORDER — HYDRALAZINE HCL 50 MG PO TABS
50.0000 mg | ORAL_TABLET | Freq: Three times a day (TID) | ORAL | Status: DC
  Administered 2013-09-19 – 2013-09-26 (×20): 50 mg via ORAL
  Filled 2013-09-19 (×23): qty 1

## 2013-09-19 MED ORDER — CARVEDILOL 12.5 MG PO TABS
12.5000 mg | ORAL_TABLET | Freq: Two times a day (BID) | ORAL | Status: DC
  Administered 2013-09-19 – 2013-09-26 (×14): 12.5 mg via ORAL
  Filled 2013-09-19 (×18): qty 1

## 2013-09-19 MED ORDER — ISOSORBIDE MONONITRATE ER 30 MG PO TB24
30.0000 mg | ORAL_TABLET | Freq: Every day | ORAL | Status: DC
  Administered 2013-09-19: 30 mg via ORAL
  Filled 2013-09-19: qty 1

## 2013-09-19 MED ORDER — ISOSORBIDE MONONITRATE ER 60 MG PO TB24
60.0000 mg | ORAL_TABLET | Freq: Every day | ORAL | Status: DC
  Administered 2013-09-20 – 2013-09-26 (×7): 60 mg via ORAL
  Filled 2013-09-19 (×7): qty 1

## 2013-09-19 MED ORDER — FUROSEMIDE 40 MG PO TABS
40.0000 mg | ORAL_TABLET | Freq: Every day | ORAL | Status: DC
  Administered 2013-09-20: 40 mg via ORAL
  Filled 2013-09-19: qty 1

## 2013-09-19 MED ORDER — HYDRALAZINE HCL 25 MG PO TABS
25.0000 mg | ORAL_TABLET | Freq: Three times a day (TID) | ORAL | Status: DC
  Administered 2013-09-19: 25 mg via ORAL
  Filled 2013-09-19 (×3): qty 1

## 2013-09-19 NOTE — Progress Notes (Signed)
Patient Name: Teresa Poole Date of Encounter: 09/19/2013  Active Problems:   Uncontrolled hypertension   Diabetes mellitus type II, uncontrolled   Acute systolic congestive heart failure   Hypertensive heart disease with CHF (congestive heart failure)   Edema   Shortness of breath   Hypertensive urgency   Cardiomyopathy, hypertensive   Length of Stay: 1  SUBJECTIVE  The patient feels some improvement in SOB today.   CURRENT MEDS . amLODipine  10 mg Oral Daily  . aspirin EC  81 mg Oral Daily  . atorvastatin  20 mg Oral q1800  . carvedilol  6.25 mg Oral BID WC  . fluticasone  1 spray Each Nare Daily  . furosemide  40 mg Oral BID  . heparin  5,000 Units Subcutaneous Q8H  . insulin aspart  0-15 Units Subcutaneous TID WC  . insulin glargine  20 Units Subcutaneous Daily  . isosorbide-hydrALAZINE  2 tablet Oral BID  . sodium chloride  3 mL Intravenous Q12H    OBJECTIVE  Filed Vitals:   09/18/13 1818 09/18/13 1847 09/18/13 2115 09/19/13 0525  BP: 165/76 190/77 172/78 178/80  Pulse: 83 98 100 92  Temp:  98.3 F (36.8 C) 98.4 F (36.9 C) 98.4 F (36.9 C)  TempSrc:  Oral Oral Oral  Resp:  16 18 20   Height:  5\' 6"  (1.676 m)    Weight:  210 lb 15.7 oz (95.7 kg)  206 lb 2.1 oz (93.5 kg)  SpO2:  97% 97% 99%    Intake/Output Summary (Last 24 hours) at 09/19/13 0836 Last data filed at 09/19/13 0200  Gross per 24 hour  Intake    360 ml  Output      0 ml  Net    360 ml   Filed Weights   09/18/13 1847 09/19/13 0525  Weight: 210 lb 15.7 oz (95.7 kg) 206 lb 2.1 oz (93.5 kg)    PHYSICAL EXAM  General: Pleasant, NAD. Neuro: Alert and oriented X 3. Moves all extremities spontaneously. Psych: Normal affect. HEENT:  Normal  Neck: Supple without bruits or JVD. Lungs:  Resp regular and unlabored, CTA. Heart: RRR no s3, s4, or murmurs. Abdomen: Soft, non-tender, non-distended, BS + x 4.  Extremities: No clubbing, cyanosis or edema. DP/PT/Radials 2+ and equal  bilaterally.  Accessory Clinical Findings  CBC  Recent Labs  09/18/13 1110 09/18/13 1916 09/19/13 0530  WBC 7.6 6.2 6.8  NEUTROABS 5.3  --   --   HGB 10.3* 10.0* 10.0*  HCT 32.0* 31.1* 30.6*  MCV 87.9 87.9 87.4  PLT 333 309 [id number]   Basic Metabolic Panel  Recent Labs  09/18/13 1110 09/18/13 1916 09/19/13 0530  NA 142  --  145  K 3.7  --  3.4*  CL 104  --  106  CO2 22  --  24  GLUCOSE 179*  --  114*  BUN 22  --  22  CREATININE 1.08 1.14* 1.24*  CALCIUM 9.4  --  9.2    Recent Labs  09/18/13 1916 09/18/13 2346 09/19/13 0530  TROPONINI <0.30 <0.30 <0.30   Radiology/Studies  Dg Chest 2 View  09/18/2013   CLINICAL DATA:  SHORTNESS OF BREATH  EXAM: CHEST  2 VIEW  COMPARISON:  DG CHEST 2V dated 09/14/2013; DG CHEST 2 VIEW dated 09/07/2013  FINDINGS: Normal cardiac silhouette. There is faint airspace disease in the lingular lobe similar to comparison exam of 09/14/2013 and improved from 09/07/2013. No pleural fluid. No pneumothorax. Degenerative  osteophytosis of the thoracic spine.  IMPRESSION: Persistent lingular pneumonia. Recommend follow-up radiographs to ensure resolution   Electronically   TELE: Sinus tachycardia, PVCs, couplets  ECG: unchanged from 09/07/2013  Echo: Study Date: 09/08/2013 Study Conclusions - Left ventricle: The cavity size was normal. Wall thickness was increased in a pattern of mild LVH. Systolic function was moderately to severely reduced. The estimated ejection fraction was in the range of 30% to 35%. Wall motion was normal; there were no regional wall motion abnormalities. - Left atrium: The atrium was mildly dilated. ------------------------------------------------------------ Left ventricle: The cavity size was normal. Wall thickness was increased in a pattern of mild LVH. Systolic function was moderately to severely reduced. The estimated ejection fraction was in the range of 30% to 35%. Wall motion was normal; there were no regional  wall motion abnormalities. Early diastolic septal annular tissue Doppler velocities Ea were abnormal. ----------------------------------------------------------- Aortic valve: Trileaflet; normal thickness leaflets. Mobility was not restricted. Doppler: Transvalvular velocity was within the normal range. There was no stenosis. No regurgitation. ------------------------------------------------------------ Aorta: Aortic root: The aortic root was normal in size. ------------------------------------------------------------ Mitral valve: Structurally normal valve. Mobility was not restricted. Doppler: Transvalvular velocity was within the normal range. There was no evidence for stenosis. Trivial regurgitation. Peak gradient: 62mm Hg (D). ------------------------------------------------------------ Left atrium: The atrium was mildly dilated. ------------------------------------------------------------ Right ventricle: The cavity size was normal. Wall thickness was normal. Systolic function was normal. ------------------------------------------------------------ Pulmonic valve: The valve appears to be grossly normal. Doppler: Transvalvular velocity was within the normal range. There was no evidence for stenosis. ------------------------------------------------------------ Tricuspid valve: Structurally normal valve. Doppler: Transvalvular velocity was within the normal range. No regurgitation. ------------------------------------------------------------ Pulmonary artery: The main pulmonary artery was normal-sized. Systolic pressure was within the normal range. ------------------------------------------------------------ Right atrium: The atrium was normal in size. ------------------------------------------------------------ Pericardium: There was no pericardial effusion. ------------------------------------------------------------ Systemic veins: Inferior vena cava: The vessel was normal  in size; the respirophasic diameter changes were in the normal range (= 50%); findings are consistent with normal central venous pressure.    ASSESSMENT AND PLAN:   Active Problems:  Uncontrolled hypertension  Shortness of breath  Diabetes mellitus type II, uncontrolled  Acute systolic congestive heart failure  Hypertensive heart disease with CHF (congestive heart failure)  Edema   1. Hypertensive urgency - still significantly elevated, further increase of carvedilol to 12.5 mg po bid, change BiDil to Hydralazine 25 mg po Q8H and Imdur 30 mg PO daily (more affordable, higher probability of compliance)  2. Acute on chronic CHF - sec to poorly controlled HTN, EF 30-35%, ACS ruled out, strict I/Os, daily weights She has a cardiomyopathy without prior cath, now with dyspnea and chest pressure with exertion. It has been felt that this is related to her uncontrolled HTN, which is likely. She has multiple risk factors for CAD including uncontrolled BP, DM, Obesity. I have discussed a cardiac cath during this admission to ultimately exclude CAD as the reason for her cardiomyopathy, dyspnea. She would be agreeable to this. Make a decision regarding cath during this admission if renal function is stable and infection is cleared.   3. Acute on chronic kidney failure - worsening Crea, decrease lasix 40 mg po to once daily  4. Pneumonia - CXR with persistent lingular PNA   5. DM- was having pain and bruising from insulin. PCP told her to decrease the dose and reffered her to an endocrinologist.   6. HLD- continue statin   Signed, Ena Dawley, H MD, [facility] 09/19/2013

## 2013-09-19 NOTE — Progress Notes (Signed)
Pt BP 189/82, HR 95.  No complaints of headache, chest pain or dizziness.  Administered scheduled hypertensive medications.  Dr. Meda Coffee notified.  Will continue to monitor.

## 2013-09-19 NOTE — Progress Notes (Signed)
Pt. BS level was 306. MD notified for insulin coverage. MD Ordered 11 Units. Continue to monitor.

## 2013-09-20 DIAGNOSIS — R609 Edema, unspecified: Secondary | ICD-10-CM

## 2013-09-20 LAB — GLUCOSE, CAPILLARY
Glucose-Capillary: 128 mg/dL — ABNORMAL HIGH (ref 70–99)
Glucose-Capillary: 141 mg/dL — ABNORMAL HIGH (ref 70–99)
Glucose-Capillary: 144 mg/dL — ABNORMAL HIGH (ref 70–99)

## 2013-09-20 NOTE — Progress Notes (Signed)
Patient Name: Teresa Poole Date of Encounter: 09/20/2013  Active Problems:   Uncontrolled hypertension   Diabetes mellitus type II, uncontrolled   Acute systolic congestive heart failure   Hypertensive heart disease with CHF (congestive heart failure)   Edema   Shortness of breath   Hypertensive urgency   Cardiomyopathy, hypertensive   Length of Stay: 2  SUBJECTIVE  The patient feels some improvement in SOB and in neck tightness.   CURRENT MEDS . amLODipine  10 mg Oral Daily  . aspirin EC  81 mg Oral Daily  . atorvastatin  20 mg Oral q1800  . carvedilol  12.5 mg Oral BID WC  . fluticasone  1 spray Each Nare Daily  . furosemide  40 mg Oral Daily  . heparin  5,000 Units Subcutaneous Q8H  . hydrALAZINE  50 mg Oral Q8H  . insulin aspart  0-15 Units Subcutaneous TID WC  . insulin glargine  20 Units Subcutaneous Daily  . isosorbide mononitrate  60 mg Oral Daily  . sodium chloride  3 mL Intravenous Q12H    OBJECTIVE  Filed Vitals:   09/19/13 1054 09/19/13 1409 09/19/13 2145 09/20/13 0353  BP: 189/82 132/62 133/67 149/67  Pulse: 95 95 81 81  Temp:  98.2 F (36.8 C) 99 F (37.2 C) 98 F (36.7 C)  TempSrc:  Oral Oral Oral  Resp:  18 17 16   Height:      Weight:    208 lb 8.9 oz (94.6 kg)  SpO2:  98% 100% 97%    Intake/Output Summary (Last 24 hours) at 09/20/13 0929 Last data filed at 09/20/13 0902  Gross per 24 hour  Intake    960 ml  Output      0 ml  Net    960 ml   Filed Weights   09/18/13 1847 09/19/13 0525 09/20/13 0353  Weight: 210 lb 15.7 oz (95.7 kg) 206 lb 2.1 oz (93.5 kg) 208 lb 8.9 oz (94.6 kg)    PHYSICAL EXAM  General: Pleasant, NAD. Neuro: Alert and oriented X 3. Moves all extremities spontaneously. Psych: Normal affect. HEENT:  Normal  Neck: Supple without bruits or JVD. Lungs:  Resp regular and unlabored, CTA. Heart: RRR no s3, s4, or murmurs. Abdomen: Soft, non-tender, non-distended, BS + x 4.  Extremities: No clubbing,  cyanosis or edema. DP/PT/Radials 2+ and equal bilaterally.  Accessory Clinical Findings  CBC  Recent Labs  09/18/13 1110 09/18/13 1916 09/19/13 0530  WBC 7.6 6.2 6.8  NEUTROABS 5.3  --   --   HGB 10.3* 10.0* 10.0*  HCT 32.0* 31.1* 30.6*  MCV 87.9 87.9 87.4  PLT 333 309 [id number]   Basic Metabolic Panel  Recent Labs  09/18/13 1110 09/18/13 1916 09/19/13 0530  NA 142  --  145  K 3.7  --  3.4*  CL 104  --  106  CO2 22  --  24  GLUCOSE 179*  --  114*  BUN 22  --  22  CREATININE 1.08 1.14* 1.24*  CALCIUM 9.4  --  9.2    Recent Labs  09/18/13 1916 09/18/13 2346 09/19/13 0530  TROPONINI <0.30 <0.30 <0.30   Radiology/Studies  Dg Chest 2 View  09/18/2013   CLINICAL DATA:  SHORTNESS OF BREATH  EXAM: CHEST  2 VIEW  COMPARISON:  DG CHEST 2V dated 09/14/2013; DG CHEST 2 VIEW dated 09/07/2013  FINDINGS: Normal cardiac silhouette. There is faint airspace disease in the lingular lobe similar to comparison  exam of 09/14/2013 and improved from 09/07/2013. No pleural fluid. No pneumothorax. Degenerative osteophytosis of the thoracic spine.  IMPRESSION: Persistent lingular pneumonia. Recommend follow-up radiographs to ensure resolution   Electronically   TELE: Sinus tachycardia, PVCs, couplets  ECG: unchanged from 09/07/2013  Echo: Study Date: 09/08/2013 Study Conclusions - Left ventricle: The cavity size was normal. Wall thickness was increased in a pattern of mild LVH. Systolic function was moderately to severely reduced. The estimated ejection fraction was in the range of 30% to 35%. Wall motion was normal; there were no regional wall motion abnormalities. - Left atrium: The atrium was mildly dilated. ------------------------------------------------------------ Left ventricle: The cavity size was normal. Wall thickness was increased in a pattern of mild LVH. Systolic function was moderately to severely reduced. The estimated ejection fraction was in the range of 30% to 35%. Wall  motion was normal; there were no regional wall motion abnormalities. Early diastolic septal annular tissue Doppler velocities Ea were abnormal. ----------------------------------------------------------- Aortic valve: Trileaflet; normal thickness leaflets. Mobility was not restricted. Doppler: Transvalvular velocity was within the normal range. There was no stenosis. No regurgitation. ------------------------------------------------------------ Aorta: Aortic root: The aortic root was normal in size. ------------------------------------------------------------ Mitral valve: Structurally normal valve. Mobility was not restricted. Doppler: Transvalvular velocity was within the normal range. There was no evidence for stenosis. Trivial regurgitation. Peak gradient: 2mm Hg (D). ------------------------------------------------------------ Left atrium: The atrium was mildly dilated. ------------------------------------------------------------ Right ventricle: The cavity size was normal. Wall thickness was normal. Systolic function was normal. ------------------------------------------------------------ Pulmonic valve: The valve appears to be grossly normal. Doppler: Transvalvular velocity was within the normal range. There was no evidence for stenosis. ------------------------------------------------------------ Tricuspid valve: Structurally normal valve. Doppler: Transvalvular velocity was within the normal range. No regurgitation. ------------------------------------------------------------ Pulmonary artery: The main pulmonary artery was normal-sized. Systolic pressure was within the normal range. ------------------------------------------------------------ Right atrium: The atrium was normal in size. ------------------------------------------------------------ Pericardium: There was no pericardial effusion. ------------------------------------------------------------ Systemic  veins: Inferior vena cava: The vessel was normal in size; the respirophasic diameter changes were in the normal range (= 50%); findings are consistent with normal central venous pressure.    ASSESSMENT AND PLAN:   Active Problems:  Uncontrolled hypertension  Shortness of breath  Diabetes mellitus type II, uncontrolled  Acute systolic congestive heart failure  Hypertensive heart disease with CHF (congestive heart failure)  Edema   1. Hypertensive urgency - controlled most of the time now, further increase of carvedilol to 12.5 mg po bid, change BiDil to Hydralazine 25 mg po Q8H and Imdur 30 mg PO daily (more affordable, higher probability of compliance)  2. Acute on chronic CHF - sec to poorly controlled HTN, EF 30-35%, ACS ruled out, strict I/Os, daily weights, appears almost euvolemic.  She has a cardiomyopathy without prior cath, now with dyspnea and chest pressure with exertion. It has been felt that this is related to her uncontrolled HTN, which is likely. She has multiple risk factors for CAD including uncontrolled BP, DM, Obesity. I have discussed a cardiac cath during this admission to ultimately exclude CAD as the reason for her cardiomyopathy, dyspnea. She would be agreeable to this. Make a decision regarding cath during this admission if renal function is stable and infection is cleared.   3. Acute on chronic kidney failure - worsening Crea, hold lasix  4. Pneumonia - CXR with persistent lingular PNA   5. DM- was having pain and bruising from insulin. PCP told her to decrease the dose and reffered her to an endocrinologist.  6. HLD- continue statin   Signed, Ena Dawley, H MD, [facility] 09/20/2013

## 2013-09-21 DIAGNOSIS — N179 Acute kidney failure, unspecified: Secondary | ICD-10-CM | POA: Diagnosis not present

## 2013-09-21 DIAGNOSIS — J189 Pneumonia, unspecified organism: Secondary | ICD-10-CM | POA: Diagnosis not present

## 2013-09-21 DIAGNOSIS — I5021 Acute systolic (congestive) heart failure: Secondary | ICD-10-CM | POA: Diagnosis not present

## 2013-09-21 DIAGNOSIS — R0602 Shortness of breath: Secondary | ICD-10-CM | POA: Diagnosis not present

## 2013-09-21 LAB — BASIC METABOLIC PANEL
BUN: 28 mg/dL — ABNORMAL HIGH (ref 6–23)
CO2: 25 mEq/L (ref 19–32)
Calcium: 8.6 mg/dL (ref 8.4–10.5)
Chloride: 104 mEq/L (ref 96–112)
Creatinine, Ser: 1.47 mg/dL — ABNORMAL HIGH (ref 0.50–1.10)
GFR calc Af Amer: 43 mL/min — ABNORMAL LOW (ref >90)
GFR calc non Af Amer: 37 mL/min — ABNORMAL LOW (ref >90)
Glucose, Bld: 135 mg/dL — ABNORMAL HIGH (ref 70–99)
Potassium: 4 mEq/L (ref 3.7–5.3)
Sodium: 143 mEq/L (ref 137–147)

## 2013-09-21 LAB — GLUCOSE, CAPILLARY
GLUCOSE-CAPILLARY: 178 mg/dL — AB (ref 70–99)
Glucose-Capillary: 172 mg/dL — ABNORMAL HIGH (ref 70–99)
Glucose-Capillary: 175 mg/dL — ABNORMAL HIGH (ref 70–99)
Glucose-Capillary: 138 mg/dL — ABNORMAL HIGH (ref 70–99)
Glucose-Capillary: 132 mg/dL — ABNORMAL HIGH (ref 70–99)
Glucose-Capillary: 132 mg/dL — ABNORMAL HIGH (ref 70–99)

## 2013-09-21 NOTE — Progress Notes (Signed)
Patient Name: Teresa Poole Date of Encounter: 09/21/2013  Active Problems:   Uncontrolled hypertension   Diabetes mellitus type II, uncontrolled   Acute systolic congestive heart failure   Hypertensive heart disease with CHF (congestive heart failure)   Edema   Shortness of breath   Hypertensive urgency   Cardiomyopathy, hypertensive   Length of Stay: 3  SUBJECTIVE  The patient feels better, improvement in SOB and in neck tightness.   CURRENT MEDS . amLODipine  10 mg Oral Daily  . aspirin EC  81 mg Oral Daily  . atorvastatin  20 mg Oral q1800  . carvedilol  12.5 mg Oral BID WC  . fluticasone  1 spray Each Nare Daily  . heparin  5,000 Units Subcutaneous Q8H  . hydrALAZINE  50 mg Oral Q8H  . insulin aspart  0-15 Units Subcutaneous TID WC  . insulin glargine  20 Units Subcutaneous Daily  . isosorbide mononitrate  60 mg Oral Daily  . sodium chloride  3 mL Intravenous Q12H    OBJECTIVE  Filed Vitals:   09/20/13 1647 09/20/13 1649 09/20/13 2134 09/21/13 0429  BP: 139/64 149/66 122/59 156/70  Pulse: 87 88 79 87  Temp:   98.1 F (36.7 C) 98 F (36.7 C)  TempSrc:   Oral Oral  Resp:      Height:      Weight:    206 lb 12.7 oz (93.8 kg)  SpO2:   98% 98%    Intake/Output Summary (Last 24 hours) at 09/21/13 0850 Last data filed at 09/21/13 0543  Gross per 24 hour  Intake   1320 ml  Output      0 ml  Net   1320 ml   Filed Weights   09/19/13 0525 09/20/13 0353 09/21/13 0429  Weight: 206 lb 2.1 oz (93.5 kg) 208 lb 8.9 oz (94.6 kg) 206 lb 12.7 oz (93.8 kg)    PHYSICAL EXAM  General: Pleasant, NAD. Neuro: Alert and oriented X 3. Moves all extremities spontaneously. Psych: Normal affect. HEENT:  Normal  Neck: Supple without bruits or JVD. Lungs:  Resp regular and unlabored, CTA. Heart: RRR no s3, s4, or murmurs. Abdomen: Soft, non-tender, non-distended, BS + x 4.  Extremities: No clubbing, cyanosis or edema. DP/PT/Radials 2+ and equal  bilaterally.  Accessory Clinical Findings  CBC  Recent Labs  09/18/13 1110 09/18/13 1916 09/19/13 0530  WBC 7.6 6.2 6.8  NEUTROABS 5.3  --   --   HGB 10.3* 10.0* 10.0*  HCT 32.0* 31.1* 30.6*  MCV 87.9 87.9 87.4  PLT 333 309 123456   Basic Metabolic Panel  Recent Labs  09/18/13 1110 09/18/13 1916 09/19/13 0530  NA 142  --  145  K 3.7  --  3.4*  CL 104  --  106  CO2 22  --  24  GLUCOSE 179*  --  114*  BUN 22  --  22  CREATININE 1.08 1.14* 1.24*  CALCIUM 9.4  --  9.2    Recent Labs  09/18/13 1916 09/18/13 2346 09/19/13 0530  TROPONINI <0.30 <0.30 <0.30   Radiology/Studies  Dg Chest 2 View  09/18/2013   CLINICAL DATA:  SHORTNESS OF BREATH  EXAM: CHEST  2 VIEW  COMPARISON:  DG CHEST 2V dated 09/14/2013; DG CHEST 2 VIEW dated 09/07/2013  FINDINGS: Normal cardiac silhouette. There is faint airspace disease in the lingular lobe similar to comparison exam of 09/14/2013 and improved from 09/07/2013. No pleural fluid. No pneumothorax. Degenerative osteophytosis  of the thoracic spine.  IMPRESSION: Persistent lingular pneumonia. Recommend follow-up radiographs to ensure resolution   Electronically   TELE: Sinus tachycardia, PVCs, couplets  ECG: unchanged from 09/07/2013  Echo: Study Date: 09/08/2013 Study Conclusions - Left ventricle: The cavity size was normal. Wall thickness was increased in a pattern of mild LVH. Systolic function was moderately to severely reduced. The estimated ejection fraction was in the range of 30% to 35%. Wall motion was normal; there were no regional wall motion abnormalities. - Left atrium: The atrium was mildly dilated. ------------------------------------------------------------ Left ventricle: The cavity size was normal. Wall thickness was increased in a pattern of mild LVH. Systolic function was moderately to severely reduced. The estimated ejection fraction was in the range of 30% to 35%. Wall motion was normal; there were no regional  wall motion abnormalities. Early diastolic septal annular tissue Doppler velocities Ea were abnormal. ----------------------------------------------------------- Aortic valve: Trileaflet; normal thickness leaflets. Mobility was not restricted. Doppler: Transvalvular velocity was within the normal range. There was no stenosis. No regurgitation. ------------------------------------------------------------ Aorta: Aortic root: The aortic root was normal in size. ------------------------------------------------------------ Mitral valve: Structurally normal valve. Mobility was not restricted. Doppler: Transvalvular velocity was within the normal range. There was no evidence for stenosis. Trivial regurgitation. Peak gradient: 41mm Hg (D). ------------------------------------------------------------ Left atrium: The atrium was mildly dilated. ------------------------------------------------------------ Right ventricle: The cavity size was normal. Wall thickness was normal. Systolic function was normal. ------------------------------------------------------------ Pulmonic valve: The valve appears to be grossly normal. Doppler: Transvalvular velocity was within the normal range. There was no evidence for stenosis. ------------------------------------------------------------ Tricuspid valve: Structurally normal valve. Doppler: Transvalvular velocity was within the normal range. No regurgitation. ------------------------------------------------------------ Pulmonary artery: The main pulmonary artery was normal-sized. Systolic pressure was within the normal range. ------------------------------------------------------------ Right atrium: The atrium was normal in size. ------------------------------------------------------------ Pericardium: There was no pericardial effusion. ------------------------------------------------------------ Systemic veins: Inferior vena cava: The vessel was normal  in size; the respirophasic diameter changes were in the normal range (= 50%); findings are consistent with normal central venous pressure.    ASSESSMENT AND PLAN:   Active Problems:  Uncontrolled hypertension  Shortness of breath  Diabetes mellitus type II, uncontrolled  Acute systolic congestive heart failure  Hypertensive heart disease with CHF (congestive heart failure)  Edema   1. Hypertensive urgency - controlled most of the time now, further increase of carvedilol to 12.5 mg po bid, change BiDil to Hydralazine 25 mg po Q8H and Imdur 30 mg PO daily (more affordable, higher probability of compliance)  2. Acute on chronic CHF - sec to poorly controlled HTN, EF 30-35%, ACS ruled out, strict I/Os, daily weights, appears euvolemic.  She has a cardiomyopathy without prior cath, now with dyspnea and chest pressure with exertion. It has been felt that this is related to her uncontrolled HTN, which is likely. She has multiple risk factors for CAD including uncontrolled BP, DM, Obesity. I have discussed a cardiac cath during this admission to ultimately exclude CAD as the reason for her cardiomyopathy, dyspnea. She would be agreeable to this. Make a decision regarding cath during this admission if renal function is stable and infection is cleared.  No labs yesterday, we will order now and possible perform cath tomorrow.   3. Acute on chronic kidney failure - lasix held, labs pending  4. Pneumonia - CXR with persistent lingular PNA   5. DM- was having pain and bruising from insulin. PCP told her to decrease the dose and reffered her to an endocrinologist.   6. HLD-  continue statin   Signed, Ena Dawley, H MD, [facility] 09/21/2013

## 2013-09-22 ENCOUNTER — Ambulatory Visit (HOSPITAL_COMMUNITY)
Admit: 2013-09-22 | Discharge: 2013-09-22 | Disposition: A | Discharge status: Home / Self Care | Payer: BC Managed Care – PPO | Attending: Cardiology | Admitting: Cardiology

## 2013-09-22 ENCOUNTER — Inpatient Hospital Stay (HOSPITAL_COMMUNITY)
Admit: 2013-09-22 | Discharge: 2013-09-22 | Disposition: A | Discharge status: Home / Self Care | Payer: BC Managed Care – PPO | Attending: Cardiology | Admitting: Cardiology

## 2013-09-22 ENCOUNTER — Other Ambulatory Visit: Payer: Self-pay

## 2013-09-22 DIAGNOSIS — J189 Pneumonia, unspecified organism: Secondary | ICD-10-CM | POA: Diagnosis not present

## 2013-09-22 DIAGNOSIS — R079 Chest pain, unspecified: Secondary | ICD-10-CM

## 2013-09-22 DIAGNOSIS — I5021 Acute systolic (congestive) heart failure: Secondary | ICD-10-CM | POA: Diagnosis not present

## 2013-09-22 DIAGNOSIS — N179 Acute kidney failure, unspecified: Secondary | ICD-10-CM | POA: Diagnosis not present

## 2013-09-22 DIAGNOSIS — R0602 Shortness of breath: Secondary | ICD-10-CM | POA: Diagnosis not present

## 2013-09-22 LAB — GLUCOSE, CAPILLARY
GLUCOSE-CAPILLARY: 121 mg/dL — AB (ref 70–99)
Glucose-Capillary: 153 mg/dL — ABNORMAL HIGH (ref 70–99)
Glucose-Capillary: 123 mg/dL — ABNORMAL HIGH (ref 70–99)

## 2013-09-22 LAB — URINALYSIS, ROUTINE W REFLEX MICROSCOPIC
Bilirubin Urine: NEGATIVE
Glucose, UA: NEGATIVE mg/dL
Hgb urine dipstick: NEGATIVE
Ketones, ur: 15 mg/dL — AB
Nitrite: NEGATIVE
Protein, ur: >300 mg/dL — AB
SPECIFIC GRAVITY, URINE: 1.018 (ref 1.005–1.030)
UROBILINOGEN UA: 0.2 mg/dL (ref 0.0–1.0)
pH: 5.5 (ref 5.0–8.0)

## 2013-09-22 LAB — URINE MICROSCOPIC-ADD ON

## 2013-09-22 LAB — HEPATIC FUNCTION PANEL
ALBUMIN: 3.2 g/dL — AB (ref 3.5–5.2)
ALT: 16 U/L (ref 0–35)
AST: 21 U/L (ref 0–37)
Alkaline Phosphatase: 58 U/L (ref 39–117)
BILIRUBIN TOTAL: 0.3 mg/dL (ref 0.3–1.2)
Bilirubin, Direct: <0.2 mg/dL (ref 0.0–0.3)
TOTAL PROTEIN: 7.4 g/dL (ref 6.0–8.3)

## 2013-09-22 MED ORDER — REGADENOSON 0.4 MG/5ML IV SOLN
0.4000 mg | Freq: Once | INTRAVENOUS | Status: AC
  Administered 2013-09-22: 0.4 mg via INTRAVENOUS

## 2013-09-22 MED ORDER — REGADENOSON 0.4 MG/5ML IV SOLN
0.4000 mg | Freq: Once | INTRAVENOUS | Status: DC
  Filled 2013-09-22: qty 5

## 2013-09-22 MED ORDER — TECHNETIUM TC 99M SESTAMIBI GENERIC - CARDIOLITE
10.0000 | Freq: Once | INTRAVENOUS | Status: AC | PRN
  Administered 2013-09-22: 10 via INTRAVENOUS

## 2013-09-22 MED ORDER — REGADENOSON 0.4 MG/5ML IV SOLN
INTRAVENOUS | Status: AC
  Filled 2013-09-22: qty 5

## 2013-09-22 MED ORDER — TECHNETIUM TC 99M SESTAMIBI GENERIC - CARDIOLITE
30.0000 | Freq: Once | INTRAVENOUS | Status: AC | PRN
  Administered 2013-09-22: 30 via INTRAVENOUS

## 2013-09-22 NOTE — Progress Notes (Signed)
Pt AO x 4,VSS, denies any pain or discomfort at this time, pt feels unhappy of been transfer to this unit from [facility].

## 2013-09-22 NOTE — Progress Notes (Signed)
Report given to Catlett at [facility].  [facility] called.  Awaiting transport for patient.

## 2013-09-22 NOTE — Progress Notes (Signed)
Patient Name: Teresa Poole Date of Encounter: 09/22/2013  Active Problems:   Uncontrolled hypertension   Diabetes mellitus type II, uncontrolled   Acute systolic congestive heart failure   Hypertensive heart disease with CHF (congestive heart failure)   Edema   Shortness of breath   Hypertensive urgency   Cardiomyopathy, hypertensive   Length of Stay: 4  SUBJECTIVE  The patient feels better, improvement in SOB and in neck tightness. Complains of pain at the site of sq heparin injection.  CURRENT MEDS . amLODipine  10 mg Oral Daily  . aspirin EC  81 mg Oral Daily  . atorvastatin  20 mg Oral q1800  . carvedilol  12.5 mg Oral BID WC  . fluticasone  1 spray Each Nare Daily  . heparin  5,000 Units Subcutaneous Q8H  . hydrALAZINE  50 mg Oral Q8H  . insulin aspart  0-15 Units Subcutaneous TID WC  . insulin glargine  20 Units Subcutaneous Daily  . isosorbide mononitrate  60 mg Oral Daily  . sodium chloride  3 mL Intravenous Q12H    OBJECTIVE  Filed Vitals:   09/21/13 0429 09/21/13 1326 09/21/13 2205 09/22/13 0514  BP: 156/70 140/65 146/67 139/72  Pulse: 87 81 85 84  Temp: 98 F (36.7 C) 98.1 F (36.7 C) 97.9 F (36.6 C) 97.9 F (36.6 C)  TempSrc: Oral Oral Oral Oral  Resp:  18 16 18   Height:      Weight: 206 lb 12.7 oz (93.8 kg)   208 lb 5.4 oz (94.5 kg)  SpO2: 98% 98% 99% 96%    Intake/Output Summary (Last 24 hours) at 09/22/13 0814 Last data filed at 09/21/13 1300  Gross per 24 hour  Intake    480 ml  Output      0 ml  Net    480 ml   Filed Weights   09/20/13 0353 09/21/13 0429 09/22/13 0514  Weight: 208 lb 8.9 oz (94.6 kg) 206 lb 12.7 oz (93.8 kg) 208 lb 5.4 oz (94.5 kg)    PHYSICAL EXAM  General: Pleasant, NAD. Neuro: Alert and oriented X 3. Moves all extremities spontaneously. Psych: Normal affect. HEENT:  Normal  Neck: Supple without bruits or JVD. Lungs:  Resp regular and unlabored, CTA. Heart: RRR no s3, s4, or murmurs. Abdomen: Soft,  non-tender, non-distended, BS + x 4.  Extremities: No clubbing, cyanosis or edema. DP/PT/Radials 2+ and equal bilaterally.  Accessory Clinical Findings  CBC No results found for this basename: WBC, NEUTROABS, HGB, HCT, MCV, PLT,  in the last 72 hours Basic Metabolic Panel  Recent Labs  09/21/13 1108  NA 143  K 4.0  CL 104  CO2 25  GLUCOSE 135*  BUN 28*  CREATININE 1.47*  CALCIUM 8.6   No results found for this basename: CKTOTAL, CKMB, CKMBINDEX, TROPONINI,  in the last 72 hours Radiology/Studies  Dg Chest 2 View  09/18/2013   CLINICAL DATA:  SHORTNESS OF BREATH  EXAM: CHEST  2 VIEW  COMPARISON:  DG CHEST 2V dated 09/14/2013; DG CHEST 2 VIEW dated 09/07/2013  FINDINGS: Normal cardiac silhouette. There is faint airspace disease in the lingular lobe similar to comparison exam of 09/14/2013 and improved from 09/07/2013. No pleural fluid. No pneumothorax. Degenerative osteophytosis of the thoracic spine.  IMPRESSION: Persistent lingular pneumonia. Recommend follow-up radiographs to ensure resolution   Electronically   TELE: Sinus tachycardia, PVCs, couplets  ECG: unchanged from 09/07/2013  Echo: Study Date: 09/08/2013 Study Conclusions - Left ventricle: The  cavity size was normal. Wall thickness was increased in a pattern of mild LVH. Systolic function was moderately to severely reduced. The estimated ejection fraction was in the range of 30% to 35%. Wall motion was normal; there were no regional wall motion abnormalities. - Left atrium: The atrium was mildly dilated. ------------------------------------------------------------ Left ventricle: The cavity size was normal. Wall thickness was increased in a pattern of mild LVH. Systolic function was moderately to severely reduced. The estimated ejection fraction was in the range of 30% to 35%. Wall motion was normal; there were no regional wall motion abnormalities. Early diastolic septal annular tissue Doppler velocities Ea were  abnormal. ----------------------------------------------------------- Aortic valve: Trileaflet; normal thickness leaflets. Mobility was not restricted. Doppler: Transvalvular velocity was within the normal range. There was no stenosis. No regurgitation. ------------------------------------------------------------ Aorta: Aortic root: The aortic root was normal in size. ------------------------------------------------------------ Mitral valve: Structurally normal valve. Mobility was not restricted. Doppler: Transvalvular velocity was within the normal range. There was no evidence for stenosis. Trivial regurgitation. Peak gradient: 66mm Hg (D). ------------------------------------------------------------ Left atrium: The atrium was mildly dilated. ------------------------------------------------------------ Right ventricle: The cavity size was normal. Wall thickness was normal. Systolic function was normal. ------------------------------------------------------------ Pulmonic valve: The valve appears to be grossly normal. Doppler: Transvalvular velocity was within the normal range. There was no evidence for stenosis. ------------------------------------------------------------ Tricuspid valve: Structurally normal valve. Doppler: Transvalvular velocity was within the normal range. No regurgitation. ------------------------------------------------------------ Pulmonary artery: The main pulmonary artery was normal-sized. Systolic pressure was within the normal range. ------------------------------------------------------------ Right atrium: The atrium was normal in size. ------------------------------------------------------------ Pericardium: There was no pericardial effusion. ------------------------------------------------------------ Systemic veins: Inferior vena cava: The vessel was normal in size; the respirophasic diameter changes were in the normal range (= 50%); findings are  consistent with normal central venous pressure.    ASSESSMENT AND PLAN:   Active Problems:  Uncontrolled hypertension  Shortness of breath  Diabetes mellitus type II, uncontrolled  Acute systolic congestive heart failure  Hypertensive heart disease with CHF (congestive heart failure)  Edema   1. Hypertensive urgency - controlled most of the time now, further increase of carvedilol to 12.5 mg po bid, change BiDil to Hydralazine 25 mg po Q8H and Imdur 30 mg PO daily (more affordable, higher probability of compliance)  2. Acute on chronic CHF - sec to poorly controlled HTN, EF 30-35%, most probably related to uncontrolled hypertension, ACS ruled out, strict I/Os, daily weights, appears euvolemic.  She has a cardiomyopathy without prior cath, now with dyspnea and chest pressure with exertion. It has been felt that this is related to her uncontrolled HTN, which is likely. She has multiple risk factors for CAD including uncontrolled BP, DM, Obesity. She has worsening AKI, we will hold cath and schedule for a Lexiscan nuclear stress test today.  3. Acute on chronic kidney failure - worsening despite held lasix for the last 2 days  4. Pneumonia - CXR with persistent lingular PNA   5. DM- was having pain and bruising from insulin. PCP told her to decrease the dose and reffered her to an endocrinologist.   6. HLD- continue statin   Signed, Ena Dawley, H MD, [facility] 09/22/2013

## 2013-09-22 NOTE — Progress Notes (Signed)
Nuc results reviewed:  IMPRESSION: 1. Mildly depressed ejection fraction with quantitative ejection fraction calculation of 41%. The inferolateral wall shows mild hypokinesis. 2. Inferolateral scar and predominately scar involving the distal anteroseptal wall and apex. There may be a small region of reversibility at the level of the distal septum.  Discussed results with Dr. Meda Coffee who recommended cath on Monday if kidney function has improved, along with transfer to [facility] in case more urgent intervention is needed. She is asx. Will hold off on full-dose heparin given negative enzymes and lack of current symptoms. Will check UA for proteinuria and hepatic function panel to assess protein status given rising Cr & uncontrolled DM. I discussed results with patient. She is generally unhappy with having to be in the hospital and sounded surprised by our recommendation about the heart catheterization. We had about a 15 minute discussion about her recent diagnoses about the rationale behind our treatment plan. She agrees for transfer to [facility]. Will send to tele bed. Continue current med plan.  Alizey Noren PA-C

## 2013-09-22 NOTE — Progress Notes (Signed)
Nuc completed without complication. Await images. Alabama Doig PA-C

## 2013-09-23 LAB — GLUCOSE, CAPILLARY
GLUCOSE-CAPILLARY: 129 mg/dL — AB (ref 70–99)
GLUCOSE-CAPILLARY: 124 mg/dL — AB (ref 70–99)
Glucose-Capillary: 95 mg/dL (ref 70–99)
Glucose-Capillary: 131 mg/dL — ABNORMAL HIGH (ref 70–99)
Glucose-Capillary: 113 mg/dL — ABNORMAL HIGH (ref 70–99)

## 2013-09-23 LAB — BASIC METABOLIC PANEL
BUN: 28 mg/dL — ABNORMAL HIGH (ref 6–23)
CO2: 21 mEq/L (ref 19–32)
Calcium: 9 mg/dL (ref 8.4–10.5)
Chloride: 105 mEq/L (ref 96–112)
Creatinine, Ser: 1.49 mg/dL — ABNORMAL HIGH (ref 0.50–1.10)
GFR calc Af Amer: 43 mL/min — ABNORMAL LOW (ref >90)
GFR, EST NON AFRICAN AMERICAN: 37 mL/min — AB (ref >90)
GLUCOSE: 94 mg/dL (ref 70–99)
POTASSIUM: 4 meq/L (ref 3.7–5.3)
SODIUM: 142 meq/L (ref 137–147)

## 2013-09-23 MED ORDER — SODIUM CHLORIDE 0.9 % IV SOLN
INTRAVENOUS | Status: AC
  Administered 2013-09-23: 1000 mL via INTRAVENOUS

## 2013-09-23 NOTE — Progress Notes (Signed)
Primary cardiologist: Dr. Casandra Doffing  Subjective:    Patient up in room, she took a shower this morning. Still reports intermittent coughing. Feeling of shortness of breath when she talks. Discomfort in her mid thoracic area.  Objective:   Temp:  [97.8 F (36.6 C)-98.1 F (36.7 C)] 98 F (36.7 C) (01/31 1016) Pulse Rate:  [75-89] 88 (01/31 1016) Resp:  [18-20] 18 (01/31 0503) BP: (123-174)/(48-78) 174/69 mmHg (01/31 1016) SpO2:  [94 %-97 %] 97 % (01/31 0503) Weight:  [207 lb 9.6 oz (94.167 kg)-210 lb 1.6 oz (95.3 kg)] 207 lb 9.6 oz (94.167 kg) (01/31 0503) Last BM Date: 09/21/13  Filed Weights   09/22/13 0514 09/22/13 1713 09/23/13 0503  Weight: 208 lb 5.4 oz (94.5 kg) 210 lb 1.6 oz (95.3 kg) 207 lb 9.6 oz (94.167 kg)    Intake/Output Summary (Last 24 hours) at 09/23/13 1105 Last data filed at 09/23/13 0900  Gross per 24 hour  Intake   1040 ml  Output    100 ml  Net    940 ml    Telemetry: Sinus rhythm.  Exam:  General: No distress.  Lungs: Decreased breath sounds but clear.  Cardiac: RRR, indistinct PMI, no gallop.  Abdomen: NABS.  Extremities: No edema.   Lab Results:  Basic Metabolic Panel:  Recent Labs Lab 09/19/13 0530 09/21/13 1108 09/23/13 0312  NA 145 143 142  K 3.4* 4.0 4.0  CL 106 104 105  CO2 24 25 21   GLUCOSE 114* 135* 94  BUN 22 28* 28*  CREATININE 1.24* 1.47* 1.49*  CALCIUM 9.2 8.6 9.0    Liver Function Tests:  Recent Labs Lab 09/22/13 1434  AST 21  ALT 16  ALKPHOS 58  BILITOT 0.3  PROT 7.4  ALBUMIN 3.2*    CBC:  Recent Labs Lab 09/18/13 1110 09/18/13 1916 09/19/13 0530  WBC 7.6 6.2 6.8  HGB 10.3* 10.0* 10.0*  HCT 32.0* 31.1* 30.6*  MCV 87.9 87.9 87.4  PLT 333 309 315    Cardiac Enzymes:  Recent Labs Lab 09/18/13 1916 09/18/13 2346 09/19/13 0530  TROPONINI <0.30 <0.30 <0.30    BNP:  Recent Labs  09/07/13 1534 09/18/13 1110  PROBNP 1402.0* 1396.0*    Lexiscan Myoview  (1/30): IMPRESSION:  1. Mildly depressed ejection fraction with quantitative ejection  fraction calculation of 41%. The inferolateral wall shows mild  hypokinesis.  2. Inferolateral scar and predominately scar involving the distal  anteroseptal wall and apex. There may be a small region of  reversibility at the level of the distal septum.   Medications:   Scheduled Medications: . amLODipine  10 mg Oral Daily  . aspirin EC  81 mg Oral Daily  . atorvastatin  20 mg Oral q1800  . carvedilol  12.5 mg Oral BID WC  . fluticasone  1 spray Each Nare Daily  . heparin  5,000 Units Subcutaneous Q8H  . hydrALAZINE  50 mg Oral Q8H  . insulin aspart  0-15 Units Subcutaneous TID WC  . insulin glargine  20 Units Subcutaneous Daily  . isosorbide mononitrate  60 mg Oral Daily  . sodium chloride  3 mL Intravenous Q12H      PRN Medications:  sodium chloride, acetaminophen, guaiFENesin-codeine, nitroGLYCERIN, ondansetron (ZOFRAN) IV, oxymetazoline, sodium chloride   Assessment:   1. Hypertensive urgency, blood pressure coming under better control with medication adjustments.  2. Acute on chronic systolic heart failure. Has evidence of cardiomyopathy with LVEF approximately 35% by echocardiogram, Myoview also demonstrating  LVEF 41% with inferolateral and distal anteroseptal scar, question small area of distal septal ischemia. Cardiac catheterization is planned for first of the week.  3. Acute on chronic renal insufficiency, Lasix held. Creatinine 1.4. She does have proteinuria by UA.  4. Type 2 diabetes mellitus, uncontrolled. On insulin.   5. Abnormal chest x-ray with persistent lingular infiltrate, recent course of antibiotics with prior hospital stay on Levaquin. Afebrile, normal WBC.   Plan/Discussion:    Continue aspirin, Norvasc, Coreg, Lipitor, hydralazine, nitrates. Will provide gentle hydration for limited course, followup BMET in a.m. She may eventually need additional imaging of  her chest (chest CT) with persistent lingular infiltrate.   Satira Sark, M.D., F.A.C.C.

## 2013-09-23 NOTE — Progress Notes (Signed)
Pt insisting on taking a shower rather than a bedside bath. Informed Ignacia Bayley, PA.  And he instructed ok for pt  to  take a shower.  Pt made aware and will continue to monitor.  Karie Kirks, Therapist, sports.

## 2013-09-24 DIAGNOSIS — N184 Chronic kidney disease, stage 4 (severe): Secondary | ICD-10-CM

## 2013-09-24 DIAGNOSIS — D631 Anemia in chronic kidney disease: Secondary | ICD-10-CM

## 2013-09-24 DIAGNOSIS — N183 Chronic kidney disease, stage 3 unspecified: Secondary | ICD-10-CM

## 2013-09-24 HISTORY — DX: Chronic kidney disease, stage 3 unspecified: N18.30

## 2013-09-24 HISTORY — DX: Chronic kidney disease, stage 4 (severe): N18.4

## 2013-09-24 LAB — BASIC METABOLIC PANEL
BUN: 27 mg/dL — AB (ref 6–23)
CO2: 22 meq/L (ref 19–32)
Calcium: 8.7 mg/dL (ref 8.4–10.5)
Chloride: 108 mEq/L (ref 96–112)
Creatinine, Ser: 1.33 mg/dL — ABNORMAL HIGH (ref 0.50–1.10)
GFR calc Af Amer: 49 mL/min — ABNORMAL LOW (ref >90)
GFR calc non Af Amer: 42 mL/min — ABNORMAL LOW (ref >90)
GLUCOSE: 83 mg/dL (ref 70–99)
POTASSIUM: 3.8 meq/L (ref 3.7–5.3)
Sodium: 145 mEq/L (ref 137–147)

## 2013-09-24 LAB — CBC
HEMATOCRIT: 29.5 % — AB (ref 36.0–46.0)
Hemoglobin: 9.7 g/dL — ABNORMAL LOW (ref 12.0–15.0)
MCH: 28.8 pg (ref 26.0–34.0)
MCHC: 32.9 g/dL (ref 30.0–36.0)
MCV: 87.5 fL (ref 78.0–100.0)
Platelets: 236 10*3/uL (ref 150–400)
RBC: 3.37 MIL/uL — AB (ref 3.87–5.11)
RDW: 13.6 % (ref 11.5–15.5)
WBC: 4.9 10*3/uL (ref 4.0–10.5)

## 2013-09-24 LAB — GLUCOSE, CAPILLARY
GLUCOSE-CAPILLARY: 148 mg/dL — AB (ref 70–99)
Glucose-Capillary: 77 mg/dL (ref 70–99)
Glucose-Capillary: 114 mg/dL — ABNORMAL HIGH (ref 70–99)
Glucose-Capillary: 148 mg/dL — ABNORMAL HIGH (ref 70–99)

## 2013-09-24 MED ORDER — ASPIRIN 81 MG PO CHEW
81.0000 mg | CHEWABLE_TABLET | ORAL | Status: AC
  Administered 2013-09-25: 81 mg via ORAL
  Filled 2013-09-24: qty 1

## 2013-09-24 MED ORDER — SALINE SPRAY 0.65 % NA SOLN
1.0000 | NASAL | Status: DC | PRN
  Filled 2013-09-24: qty 44

## 2013-09-24 NOTE — Progress Notes (Signed)
CCTV video shown to the patient per order for cardiac cath in am, patient become anxious, PA notified, PA/MD will come to see patient before the procedure. Will continue to monitor patient.

## 2013-09-24 NOTE — Progress Notes (Signed)
Patient Name: Teresa Poole Date of Encounter: 09/24/2013   Principal Problem:   Hypertensive urgency Active Problems:   Acute systolic congestive heart failure   Cardiomyopathy, hypertensive   Diabetes mellitus type II, uncontrolled   Obesity (BMI 30-39.9)   CKD (chronic kidney disease), stage III   Edema   SUBJECTIVE  Breathing better overall. Able to sleep lying flat.  Feels upper airway/nasal congestion.  Abdomen still a little bloated/+ LEE.  CURRENT MEDS . amLODipine  10 mg Oral Daily  . aspirin EC  81 mg Oral Daily  . atorvastatin  20 mg Oral q1800  . carvedilol  12.5 mg Oral BID WC  . fluticasone  1 spray Each Nare Daily  . heparin  5,000 Units Subcutaneous Q8H  . hydrALAZINE  50 mg Oral Q8H  . insulin aspart  0-15 Units Subcutaneous TID WC  . insulin glargine  20 Units Subcutaneous Daily  . isosorbide mononitrate  60 mg Oral Daily  . sodium chloride  3 mL Intravenous Q12H    OBJECTIVE  Filed Vitals:   09/23/13 1509 09/23/13 1726 09/23/13 2150 09/24/13 0500  BP: 122/57 152/66 133/60 153/61  Pulse:  85 92 82  Temp:   98.1 F (36.7 C) 98.6 F (37 C)  TempSrc:   Oral Oral  Resp:   18 18  Height:      Weight:    205 lb 6.4 oz (93.169 kg)  SpO2:   98% 99%    Intake/Output Summary (Last 24 hours) at 09/24/13 0800 Last data filed at 09/24/13 0505  Gross per 24 hour  Intake  952.5 ml  Output    400 ml  Net  552.5 ml   Filed Weights   09/22/13 1713 09/23/13 0503 09/24/13 0500  Weight: 210 lb 1.6 oz (95.3 kg) 207 lb 9.6 oz (94.167 kg) 205 lb 6.4 oz (93.169 kg)   PHYSICAL EXAM  General: Pleasant, NAD. Neuro: Alert and oriented X 3. Moves all extremities spontaneously. Psych: Normal affect. HEENT:  Normal  Neck: Supple with bilat bruits vs radiated murmur.  Difficult to assess JVP 2/2 girth. Lungs:  Resp regular and unlabored, CTA. Heart: RRR + s4, 2/6 SEM bilat usb. Abdomen: Soft, non-tender, non-distended, BS + x 4.  Extremities: No clubbing,  cyanosis.  2+ bilat LE edema to mid calf. DP/PT/Radials 2+ and equal bilaterally.  Accessory Clinical Findings  CBC  Recent Labs  09/24/13 0414  WBC 4.9  HGB 9.7*  HCT 29.5*  MCV 87.5  PLT [id number]   Basic Metabolic Panel  Recent Labs  09/23/13 0312 09/24/13 0414  NA 142 145  K 4.0 3.8  CL 105 108  CO2 21 22  GLUCOSE 94 83  BUN 28* 27*  CREATININE 1.49* 1.33*  CALCIUM 9.0 8.7   Liver Function Tests  Recent Labs  09/22/13 1434  AST 21  ALT 16  ALKPHOS 58  BILITOT 0.3  PROT 7.4  ALBUMIN 3.2*   TELE  rsr  Radiology/Studies  Dg Chest 2 View  09/18/2013   CLINICAL DATA:  SHORTNESS OF BREATH  EXAM: CHEST  2 VIEW  IMPRESSION: Persistent lingular pneumonia. Recommend follow-up radiographs to ensure resolution   Electronically Signed   By: Suzy Bouchard M.D.   On: 09/18/2013 11:58   Nm Myocar Multi W/spect W/wall Motion / Ef  09/22/2013   CLINICAL DATA:  Chest pain, diabetes, shortness of breath and CHF.  EXAM: MYOCARDIAL IMAGING WITH SPECT (REST AND PHARMACOLOGIC-STRESS)  GATED LEFT VENTRICULAR WALL  MOTION STUDY  LEFT VENTRICULAR EJECTION FRACTION IMPRESSION: 1. Mildly depressed ejection fraction with quantitative ejection fraction calculation of 41%. The inferolateral wall shows mild hypokinesis. 2. Inferolateral scar and predominately scar involving the distal anteroseptal wall and apex. There may be a small region of reversibility at the level of the distal septum.   Electronically Signed   By: Aletta Edouard M.D.   On: 09/22/2013 13:24    ASSESSMENT AND PLAN  1.  Hypertensive urgency:  BP's variable, 120's to 150's.  Trend today and have a low threshold to push coreg further. Cont hydral/nitrate @ current doses.  2.  Acute on chronic systolic CHF:  In setting of above.  evidence of cardiomyopathy with LVEF approximately 35% by echocardiogram, Myoview also demonstrating LVEF 41% with inferolateral and distal anteroseptal scar, question small area of distal septal  ischemia.  She has LEE and reports some abd bloating.  Lungs are clear.  Wt down overall, though I/O show net positive of 4.2 L for this admission.  She has been getting gentle hydration in setting of pending cath tomorrow.  Creat stable.  I suspect she would benefit from the addition of lasix but we may want to wait until after cath - will d/w Dr. Domenic Polite.  Will KVO IVF.  Cont bb with eye towards titrating if BP's trend up today.  Cont hydral/nitrate.  No Acei/ARB in setting of stage III ckd and need for contrast tomorrow.  3.  Acute on chronic stage III kidney dzs:  Creat stable in setting of gentle hydration.  Follow.  4.  DMII:  Sugars stable.  Cont lantus/SSI.  5.  Abnl CXR:  Persistent lingular infiltrate, recent course of antibiotics with prior hospital stay on Levaquin. Afebrile, normal WBC.  She may eventually need additional imaging of her chest (chest CT) with persistent lingular infiltrate.  6.  Normocytic anemia:  H/H slowly trending down.  Will guaiac stools.  7.  Nasal congestion:  Afebrile, nl wbc.  Will add saline nasal spray.  Signed, Murray Hodgkins NP   Attending note:  Patient has been seen and examined. Reviewed and agree with note by Mr. Sharolyn Douglas NP. No chest pain, breathing improved in general, still with intermittent cough. With gentle hydration creatinine has improved somewhat, now down to 1.3. We are holding off on Lasix, ACE inhibitor, and ARB at this point pending cardiac catheterization. This is tentatively scheduled for tomorrow. Would hold off on ventriculogram to limit contrast. Followup BMET in a.m. Plan is otherwise detailed above.  Satira Sark, M.D., F.A.C.C.

## 2013-09-25 ENCOUNTER — Encounter (HOSPITAL_COMMUNITY)
Admission: EM | Disposition: A | Discharge status: Home Health | Payer: Self-pay | Source: Home / Self Care | Attending: Cardiovascular Disease

## 2013-09-25 DIAGNOSIS — I2510 Atherosclerotic heart disease of native coronary artery without angina pectoris: Secondary | ICD-10-CM

## 2013-09-25 HISTORY — PX: LEFT HEART CATHETERIZATION WITH CORONARY ANGIOGRAM: SHX5451

## 2013-09-25 LAB — PROTIME-INR
INR: 1 (ref 0.00–1.49)
Prothrombin Time: 13 seconds (ref 11.6–15.2)

## 2013-09-25 LAB — GLUCOSE, CAPILLARY
GLUCOSE-CAPILLARY: 139 mg/dL — AB (ref 70–99)
Glucose-Capillary: 99 mg/dL (ref 70–99)
Glucose-Capillary: 96 mg/dL (ref 70–99)
Glucose-Capillary: 113 mg/dL — ABNORMAL HIGH (ref 70–99)

## 2013-09-25 LAB — BASIC METABOLIC PANEL
BUN: 25 mg/dL — AB (ref 6–23)
CALCIUM: 8.8 mg/dL (ref 8.4–10.5)
CHLORIDE: 106 meq/L (ref 96–112)
CO2: 22 meq/L (ref 19–32)
Creatinine, Ser: 1.21 mg/dL — ABNORMAL HIGH (ref 0.50–1.10)
GFR calc Af Amer: 55 mL/min — ABNORMAL LOW (ref >90)
GFR calc non Af Amer: 47 mL/min — ABNORMAL LOW (ref >90)
Glucose, Bld: 101 mg/dL — ABNORMAL HIGH (ref 70–99)
Potassium: 3.8 mEq/L (ref 3.7–5.3)
Sodium: 144 mEq/L (ref 137–147)

## 2013-09-25 LAB — PROTEIN, URINE, 24 HOUR
COLLECTION INTERVAL-UPROT: 24 h
Protein, 24H Urine: 1968 mg/d — ABNORMAL HIGH (ref 50–100)
Protein, Urine: 123 mg/dL
Urine Total Volume-UPROT: 1600 mL

## 2013-09-25 SURGERY — LEFT HEART CATHETERIZATION WITH CORONARY ANGIOGRAM
Anesthesia: LOCAL

## 2013-09-25 MED ORDER — HEPARIN (PORCINE) IN NACL 2-0.9 UNIT/ML-% IJ SOLN
INTRAMUSCULAR | Status: AC
  Filled 2013-09-25: qty 1000

## 2013-09-25 MED ORDER — SODIUM CHLORIDE 0.9 % IV SOLN
INTRAVENOUS | Status: AC
  Administered 2013-09-25 (×2): via INTRAVENOUS

## 2013-09-25 MED ORDER — FENTANYL CITRATE 0.05 MG/ML IJ SOLN
INTRAMUSCULAR | Status: AC
  Filled 2013-09-25: qty 2

## 2013-09-25 MED ORDER — FUROSEMIDE 10 MG/ML IJ SOLN
40.0000 mg | Freq: Two times a day (BID) | INTRAMUSCULAR | Status: DC
  Administered 2013-09-25 – 2013-09-26 (×2): 40 mg via INTRAVENOUS
  Filled 2013-09-25 (×3): qty 4

## 2013-09-25 MED ORDER — MIDAZOLAM HCL 2 MG/2ML IJ SOLN
INTRAMUSCULAR | Status: AC
  Filled 2013-09-25: qty 2

## 2013-09-25 MED ORDER — HEPARIN SODIUM (PORCINE) 1000 UNIT/ML IJ SOLN
INTRAMUSCULAR | Status: AC
  Filled 2013-09-25: qty 1

## 2013-09-25 MED ORDER — VERAPAMIL HCL 2.5 MG/ML IV SOLN
INTRAVENOUS | Status: AC
  Filled 2013-09-25: qty 2

## 2013-09-25 MED ORDER — LIDOCAINE HCL (PF) 1 % IJ SOLN
INTRAMUSCULAR | Status: AC
  Filled 2013-09-25: qty 30

## 2013-09-25 MED ORDER — NITROGLYCERIN 0.2 MG/ML ON CALL CATH LAB
INTRAVENOUS | Status: AC
  Filled 2013-09-25: qty 1

## 2013-09-25 MED ORDER — SODIUM CHLORIDE 0.9 % IV SOLN
INTRAVENOUS | Status: DC
  Administered 2013-09-25: 10 mL/h via INTRAVENOUS

## 2013-09-25 NOTE — CV Procedure (Signed)
      Cardiac Catheterization Operative Report  LEONAH STOBBS MA:[id number] 2/2/20152:01 PM Gennette Pac, MD  Procedure Performed:  1. Left Heart Catheterization 2. Selective Coronary Angiography  Operator: Lauree Chandler, MD  Arterial access site:  Right radial artery.   Indication:  63 y.o. female with a history of HLD, uncontrolled HTN, diabetes mellitus and cardiomyopathy with an EF of 30-35% on recent echocardiogram who was admitted with shortness of breath, volume overload. Stress myoview with possible ischemia.                            Procedure Details: The risks, benefits, complications, treatment options, and expected outcomes were discussed with the patient. The patient and/or family concurred with the proposed plan, giving informed consent. The patient was brought to the cath lab after IV hydration was begun and oral premedication was given. The patient was further sedated with Versed and Fentanyl. The right wrist was assessed with an Allens test which was positive. The right wrist was prepped and draped in a sterile fashion. 1% lidocaine was used for local anesthesia. Using the modified Seldinger access technique, a 5 French sheath was placed in the right radial artery. 3 mg Verapamil was given through the sheath. 4500 units IV heparin was given. Standard diagnostic catheters were used to perform selective coronary angiography. A pigtail catheter was used to measure LV pressures. The sheath was removed from the right radial artery and a Terumo hemostasis band was applied at the arteriotomy site on the right wrist.    There were no immediate complications. The patient was taken to the recovery area in stable condition.   Hemodynamic Findings: Central aortic pressure: 130/63 Left ventricular pressure: 130/15/29  Angiographic Findings:  Left main:  Short segment, no obstructive disease.   Left Anterior Descending Artery: Large caliber vessel that courses to  the apex. The proximal and mid vessel has serial 20% stenoses. There are several small diagonal branches.   Circumflex Artery: Large caliber vessel that gives off a large obtuse marginal branch with proximal 30% stenosis. The mid AV groove Circumflex has 30% stenosis.   Right Coronary Artery: Moderate caliber non-dominant vessel with mild mid plaque.   Left Ventricular Angiogram: Deferred.   Impression: 1. Non-obstructive CAD 2. Non-ischemic cardiomyopathy  Recommendations: Will continue diuresis. Continue medical management of CAD.        Complications:  None. The patient tolerated the procedure well.

## 2013-09-25 NOTE — Progress Notes (Signed)
Completion of band removal. No signs of bleeding. Will continue to monitor patient for further changes in condition.

## 2013-09-25 NOTE — Progress Notes (Signed)
Report given to receiving RN. Patient sitting on the side of the bed watching TV and is stable. No signs or symptoms of distress or discomfort.

## 2013-09-25 NOTE — H&P (View-Only) (Signed)
Patient Name: Teresa Poole Date of Encounter: 09/24/2013   Principal Problem:   Hypertensive urgency Active Problems:   Acute systolic congestive heart failure   Cardiomyopathy, hypertensive   Diabetes mellitus type II, uncontrolled   Obesity (BMI 30-39.9)   CKD (chronic kidney disease), stage III   Edema   SUBJECTIVE  Breathing better overall. Able to sleep lying flat.  Feels upper airway/nasal congestion.  Abdomen still a little bloated/+ LEE.  CURRENT MEDS . amLODipine  10 mg Oral Daily  . aspirin EC  81 mg Oral Daily  . atorvastatin  20 mg Oral q1800  . carvedilol  12.5 mg Oral BID WC  . fluticasone  1 spray Each Nare Daily  . heparin  5,000 Units Subcutaneous Q8H  . hydrALAZINE  50 mg Oral Q8H  . insulin aspart  0-15 Units Subcutaneous TID WC  . insulin glargine  20 Units Subcutaneous Daily  . isosorbide mononitrate  60 mg Oral Daily  . sodium chloride  3 mL Intravenous Q12H    OBJECTIVE  Filed Vitals:   09/23/13 1509 09/23/13 1726 09/23/13 2150 09/24/13 0500  BP: 122/57 152/66 133/60 153/61  Pulse:  85 92 82  Temp:   98.1 F (36.7 C) 98.6 F (37 C)  TempSrc:   Oral Oral  Resp:   18 18  Height:      Weight:    205 lb 6.4 oz (93.169 kg)  SpO2:   98% 99%    Intake/Output Summary (Last 24 hours) at 09/24/13 0800 Last data filed at 09/24/13 0505  Gross per 24 hour  Intake  952.5 ml  Output    400 ml  Net  552.5 ml   Filed Weights   09/22/13 1713 09/23/13 0503 09/24/13 0500  Weight: 210 lb 1.6 oz (95.3 kg) 207 lb 9.6 oz (94.167 kg) 205 lb 6.4 oz (93.169 kg)   PHYSICAL EXAM  General: Pleasant, NAD. Neuro: Alert and oriented X 3. Moves all extremities spontaneously. Psych: Normal affect. HEENT:  Normal  Neck: Supple with bilat bruits vs radiated murmur.  Difficult to assess JVP 2/2 girth. Lungs:  Resp regular and unlabored, CTA. Heart: RRR + s4, 2/6 SEM bilat usb. Abdomen: Soft, non-tender, non-distended, BS + x 4.  Extremities: No clubbing,  cyanosis.  2+ bilat LE edema to mid calf. DP/PT/Radials 2+ and equal bilaterally.  Accessory Clinical Findings  CBC  Recent Labs  09/24/13 0414  WBC 4.9  HGB 9.7*  HCT 29.5*  MCV 87.5  PLT [id number]   Basic Metabolic Panel  Recent Labs  09/23/13 0312 09/24/13 0414  NA 142 145  K 4.0 3.8  CL 105 108  CO2 21 22  GLUCOSE 94 83  BUN 28* 27*  CREATININE 1.49* 1.33*  CALCIUM 9.0 8.7   Liver Function Tests  Recent Labs  09/22/13 1434  AST 21  ALT 16  ALKPHOS 58  BILITOT 0.3  PROT 7.4  ALBUMIN 3.2*   TELE  rsr  Radiology/Studies  Dg Chest 2 View  09/18/2013   CLINICAL DATA:  SHORTNESS OF BREATH  EXAM: CHEST  2 VIEW  IMPRESSION: Persistent lingular pneumonia. Recommend follow-up radiographs to ensure resolution   Electronically Signed   By: Suzy Bouchard M.D.   On: 09/18/2013 11:58   Nm Myocar Multi W/spect W/wall Motion / Ef  09/22/2013   CLINICAL DATA:  Chest pain, diabetes, shortness of breath and CHF.  EXAM: MYOCARDIAL IMAGING WITH SPECT (REST AND PHARMACOLOGIC-STRESS)  GATED LEFT VENTRICULAR WALL  MOTION STUDY  LEFT VENTRICULAR EJECTION FRACTION IMPRESSION: 1. Mildly depressed ejection fraction with quantitative ejection fraction calculation of 41%. The inferolateral wall shows mild hypokinesis. 2. Inferolateral scar and predominately scar involving the distal anteroseptal wall and apex. There may be a small region of reversibility at the level of the distal septum.   Electronically Signed   By: Aletta Edouard M.D.   On: 09/22/2013 13:24    ASSESSMENT AND PLAN  1.  Hypertensive urgency:  BP's variable, 120's to 150's.  Trend today and have a low threshold to push coreg further. Cont hydral/nitrate @ current doses.  2.  Acute on chronic systolic CHF:  In setting of above.  evidence of cardiomyopathy with LVEF approximately 35% by echocardiogram, Myoview also demonstrating LVEF 41% with inferolateral and distal anteroseptal scar, question small area of distal septal  ischemia.  She has LEE and reports some abd bloating.  Lungs are clear.  Wt down overall, though I/O show net positive of 4.2 L for this admission.  She has been getting gentle hydration in setting of pending cath tomorrow.  Creat stable.  I suspect she would benefit from the addition of lasix but we may want to wait until after cath - will d/w Dr. Domenic Polite.  Will KVO IVF.  Cont bb with eye towards titrating if BP's trend up today.  Cont hydral/nitrate.  No Acei/ARB in setting of stage III ckd and need for contrast tomorrow.  3.  Acute on chronic stage III kidney dzs:  Creat stable in setting of gentle hydration.  Follow.  4.  DMII:  Sugars stable.  Cont lantus/SSI.  5.  Abnl CXR:  Persistent lingular infiltrate, recent course of antibiotics with prior hospital stay on Levaquin. Afebrile, normal WBC.  She may eventually need additional imaging of her chest (chest CT) with persistent lingular infiltrate.  6.  Normocytic anemia:  H/H slowly trending down.  Will guaiac stools.  7.  Nasal congestion:  Afebrile, nl wbc.  Will add saline nasal spray.  Signed, Murray Hodgkins NP   Attending note:  Patient has been seen and examined. Reviewed and agree with note by Mr. Sharolyn Douglas NP. No chest pain, breathing improved in general, still with intermittent cough. With gentle hydration creatinine has improved somewhat, now down to 1.3. We are holding off on Lasix, ACE inhibitor, and ARB at this point pending cardiac catheterization. This is tentatively scheduled for tomorrow. Would hold off on ventriculogram to limit contrast. Followup BMET in a.m. Plan is otherwise detailed above.  Satira Sark, M.D., F.A.C.C.

## 2013-09-25 NOTE — Progress Notes (Signed)
SUBJECTIVE:  Currently in the cath lab  OBJECTIVE:   Vitals:   Filed Vitals:   09/24/13 0954 09/24/13 1300 09/24/13 1624 09/24/13 2019  BP: 156/63 125/60 138/60 125/58  Pulse: 83 76 79 78  Temp:  97.5 F (36.4 C)  97.4 F (36.3 C)  TempSrc: Oral Oral  Oral  Resp: 20 20  20   Height:      Weight:      SpO2: 97% 100%  98%   I&O's:   Intake/Output Summary (Last 24 hours) at 09/25/13 0800 Last data filed at 09/25/13 0500  Gross per 24 hour  Intake    960 ml  Output   2800 ml  Net  -1840 ml   TELEMETRY: Reviewed telemetry pt in NSR:     PHYSICAL EXAM Not performed patient on cath lab table  LABS: Basic Metabolic Panel:  Recent Labs  09/24/13 0414 09/25/13 0422  NA 145 144  K 3.8 3.8  CL 108 106  CO2 22 22  GLUCOSE 83 101*  BUN 27* 25*  CREATININE 1.33* 1.21*  CALCIUM 8.7 8.8   Liver Function Tests:  Recent Labs  09/22/13 1434  AST 21  ALT 16  ALKPHOS 58  BILITOT 0.3  PROT 7.4  ALBUMIN 3.2*   No results found for this basename: LIPASE, AMYLASE,  in the last 72 hours CBC:  Recent Labs  09/24/13 0414  WBC 4.9  HGB 9.7*  HCT 29.5*  MCV 87.5  PLT 236   Cardiac Enzymes: No results found for this basename: CKTOTAL, CKMB, CKMBINDEX, TROPONINI,  in the last 72 hours BNP: No components found with this basename: POCBNP,  D-Dimer: No results found for this basename: DDIMER,  in the last 72 hours Hemoglobin A1C: No results found for this basename: HGBA1C,  in the last 72 hours Fasting Lipid Panel: No results found for this basename: CHOL, HDL, LDLCALC, TRIG, CHOLHDL, LDLDIRECT,  in the last 72 hours Thyroid Function Tests: No results found for this basename: TSH, T4TOTAL, FREET3, T3FREE, THYROIDAB,  in the last 72 hours Anemia Panel: No results found for this basename: VITAMINB12, FOLATE, FERRITIN, TIBC, IRON, RETICCTPCT,  in the last 72 hours Coag Panel:   Lab Results  Component Value Date   INR 1.00 09/25/2013   INR 0.89 09/07/2013     RADIOLOGY: Dg Chest 2 View  09/18/2013   CLINICAL DATA:  SHORTNESS OF BREATH  EXAM: CHEST  2 VIEW  COMPARISON:  DG CHEST 2V dated 09/14/2013; DG CHEST 2 VIEW dated 09/07/2013  FINDINGS: Normal cardiac silhouette. There is faint airspace disease in the lingular lobe similar to comparison exam of 09/14/2013 and improved from 09/07/2013. No pleural fluid. No pneumothorax. Degenerative osteophytosis of the thoracic spine.  IMPRESSION: Persistent lingular pneumonia. Recommend follow-up radiographs to ensure resolution   Electronically Signed   By: Suzy Bouchard M.D.   On: 09/18/2013 11:58   Dg Chest 2 View  09/07/2013   CLINICAL DATA:  Shortness of breath, cough  EXAM: CHEST  2 VIEW  COMPARISON:  09/07/2013  FINDINGS: Cardiac silhouette is mild-to-moderately enlarged. Mediastinal contours are unremarkable. Ill-defined area of increased density projects in the region of the lingula. This finding is unchanged compared to previous study. No further focal regions of consolidation or focal infiltrates. The osseous structures are unremarkable.  IMPRESSION: Infiltrate versus atelectasis in the region of the lingula.   Electronically Signed   By: Margaree Mackintosh M.D.   On: 09/07/2013 15:55   Nm Myocar Multi W/spect  W/wall Motion / Ef  09/22/2013   CLINICAL DATA:  Chest pain, diabetes, shortness of breath and CHF.  EXAM: MYOCARDIAL IMAGING WITH SPECT (REST AND PHARMACOLOGIC-STRESS)  GATED LEFT VENTRICULAR WALL MOTION STUDY  LEFT VENTRICULAR EJECTION FRACTION  TECHNIQUE: Standard myocardial SPECT imaging was performed after resting intravenous injection of 10 mCi Tc-64m sestamibi. Subsequently, intravenous infusion of Lexiscan was performed under the supervision of the Cardiology staff. At peak effect of the drug, 30 mCi Tc-92m sestamibi was injected intravenously and standard myocardial SPECT imaging was performed. Quantitative gated imaging was also performed to evaluate left ventricular wall motion, and estimate  left ventricular ejection fraction.  COMPARISON:  None.  FINDINGS: Utilizing gated data, the end-diastolic volume is estimated to be 138 mL and the end systolic volume 82 mL. Calculated ejection fraction is 41%.  Gated wall motion analysis shows mild inferolateral hypokinesis. No akinetic or dyskinetic segments are identified.  SPECT imaging shows inferolateral scar. Scar is also identified in the distal anteroseptal wall and apex. There may be a small region of reversibility at the level of the distal septum. Other than this focal region, no significant inducible ischemia is identified with Lexiscan administration.  IMPRESSION: 1. Mildly depressed ejection fraction with quantitative ejection fraction calculation of 41%. The inferolateral wall shows mild hypokinesis. 2. Inferolateral scar and predominately scar involving the distal anteroseptal wall and apex. There may be a small region of reversibility at the level of the distal septum.   Electronically Signed   By: Aletta Edouard M.D.   On: 09/22/2013 13:24    Principal Problem:  Hypertensive urgency  Active Problems:  Acute systolic congestive heart failure  Cardiomyopathy, hypertensive  Diabetes mellitus type II, uncontrolled  Obesity (BMI 30-39.9)  CKD (ch Chronic kidney disease), stage III  Edema Moderate LV dysfunction EF 35-40%  ASSESSMENT AND PLAN  1. Hypertensive urgency: BP's  Much improved in last 24 hours.  Cont hydral/nitrate/Coreg @ current doses.  2. Acute on chronic systolic CHF: In setting of above. evidence of cardiomyopathy with LVEF approximately 35% by echocardiogram, Myoview also demonstrating LVEF 41% with inferolateral and distal anteroseptal scar, question small area of distal septal ischemia. She has LEE and reports some abd bloating. Lungs are clear. Wt down overall, though I/O show net positive of 2.4 L for this admission. She has been getting gentle hydration in setting of pending cath today. Creat stable.No Acei/ARB  in setting of stage III ckd and need for contrast today. LVEDP 29 on cath.  Will give IV Lasix tonight and reassess volume status in am. 3. Acute on chronic stage III kidney dzs: Creat stable in setting of gentle hydration. Follow.  4. DMII: Sugars stable. Cont lantus/SSI.  5. Abnl CXR: Persistent lingular infiltrate, recent course of antibiotics with prior hospital stay on Levaquin. Afebrile, normal WBC. She may eventually need additional imaging of her chest (chest CT) with persistent lingular infiltrate.  6. Normocytic anemia: H/H slowly trending down. Will guaiac stools.  7. Nasal congestion: Afebrile, nl wbc. Will add saline nasal spray.        Sueanne Margarita, MD  09/25/2013  8:00 AM

## 2013-09-25 NOTE — Progress Notes (Signed)
VASCULAR LAB PRELIMINARY  PRELIMINARY  PRELIMINARY  PRELIMINARY  Carotid Dopplers completed.    Preliminary report:  1-39% ICA stenosis.  Vertebral artery flow is antegrade.  Ysabela Keisler, RVT 09/25/2013, 8:44 AM

## 2013-09-25 NOTE — Progress Notes (Signed)
Patient return to unit from procedure. No signs of bleeding in right radial. Will continue to monitor.

## 2013-09-25 NOTE — Interval H&P Note (Signed)
History and Physical Interval Note:  09/25/2013 1:05 PM  Teresa Poole Teresa Poole  has presented today for cardiac cath with the diagnosis of CHF, chest pain, cardiomyopathy.  The various methods of treatment have been discussed with the patient and family. After consideration of risks, benefits and other options for treatment, the patient has consented to  Procedure(s): LEFT HEART CATHETERIZATION WITH CORONARY ANGIOGRAM (N/A) as a surgical intervention .  The patient's history has been reviewed, patient examined, no change in status, stable for surgery.  I have reviewed the patient's chart and labs.  Questions were answered to the patient's satisfaction.    Cath Lab Visit (complete for each Cath Lab visit)  Clinical Evaluation Leading to the Procedure:   ACS: no  Non-ACS:    Anginal Classification: CCS II  Anti-ischemic medical therapy: Maximal Therapy (2 or more classes of medications)  Non-Invasive Test Results: Low-risk stress test findings: cardiac mortality <1%/year  Prior CABG: No previous CABG         Teresa Poole

## 2013-09-26 ENCOUNTER — Inpatient Hospital Stay (HOSPITAL_COMMUNITY): Payer: BC Managed Care – PPO

## 2013-09-26 DIAGNOSIS — N183 Chronic kidney disease, stage 3 unspecified: Secondary | ICD-10-CM

## 2013-09-26 DIAGNOSIS — I5041 Acute combined systolic (congestive) and diastolic (congestive) heart failure: Secondary | ICD-10-CM

## 2013-09-26 LAB — CBC
HCT: 31.3 % — ABNORMAL LOW (ref 36.0–46.0)
Hemoglobin: 10.3 g/dL — ABNORMAL LOW (ref 12.0–15.0)
MCH: 28.8 pg (ref 26.0–34.0)
MCHC: 32.9 g/dL (ref 30.0–36.0)
MCV: 87.4 fL (ref 78.0–100.0)
Platelets: 225 10*3/uL (ref 150–400)
RBC: 3.58 MIL/uL — ABNORMAL LOW (ref 3.87–5.11)
RDW: 13.6 % (ref 11.5–15.5)
WBC: 5.1 10*3/uL (ref 4.0–10.5)

## 2013-09-26 LAB — BASIC METABOLIC PANEL
BUN: 23 mg/dL (ref 6–23)
CALCIUM: 8.7 mg/dL (ref 8.4–10.5)
CO2: 24 mEq/L (ref 19–32)
Chloride: 103 mEq/L (ref 96–112)
Creatinine, Ser: 1.39 mg/dL — ABNORMAL HIGH (ref 0.50–1.10)
GFR, EST AFRICAN AMERICAN: 46 mL/min — AB (ref >90)
GFR, EST NON AFRICAN AMERICAN: 40 mL/min — AB (ref >90)
Glucose, Bld: 91 mg/dL (ref 70–99)
Potassium: 3.9 mEq/L (ref 3.7–5.3)
Sodium: 142 mEq/L (ref 137–147)

## 2013-09-26 LAB — GLUCOSE, CAPILLARY
Glucose-Capillary: 100 mg/dL — ABNORMAL HIGH (ref 70–99)
Glucose-Capillary: 157 mg/dL — ABNORMAL HIGH (ref 70–99)

## 2013-09-26 MED ORDER — ISOSORB DINITRATE-HYDRALAZINE 20-37.5 MG PO TABS
2.0000 | ORAL_TABLET | Freq: Two times a day (BID) | ORAL | Status: DC
Start: 2013-09-26 — End: 2016-12-21

## 2013-09-26 MED ORDER — CARVEDILOL 12.5 MG PO TABS: 12.5000 mg | ORAL_TABLET | Freq: Two times a day (BID) | ORAL | Status: DC

## 2013-09-26 MED ORDER — NITROGLYCERIN 0.4 MG SL SUBL: 0.4000 mg | SUBLINGUAL_TABLET | SUBLINGUAL | Status: DC | PRN

## 2013-09-26 MED ORDER — AMLODIPINE BESYLATE 10 MG PO TABS: 10.0000 mg | ORAL_TABLET | Freq: Every day | ORAL | Status: DC

## 2013-09-26 MED ORDER — FUROSEMIDE 40 MG PO TABS: 40.0000 mg | ORAL_TABLET | Freq: Two times a day (BID) | ORAL | Status: DC

## 2013-09-26 MED ORDER — FUROSEMIDE 40 MG PO TABS
40.0000 mg | ORAL_TABLET | Freq: Two times a day (BID) | ORAL | Status: DC
  Filled 2013-09-26 (×2): qty 1

## 2013-09-26 NOTE — Care Management Note (Signed)
    Page 1 of 2   09/26/2013     11:39:44 AM   CARE MANAGEMENT NOTE 09/26/2013  Patient:  Teresa Poole, Teresa Poole   Account Number:  [id number]  Date Initiated:  09/22/2013  Documentation initiated by:  Gabriel Earing  Subjective/Objective Assessment:   pt admitted with uncontrolled HTN     Action/Plan:   from home   Anticipated DC Date:  09/23/2013   Anticipated DC Plan:  [place]  CM consult      Choice offered to / List presented to:  C-1 Patient        [place] arranged  HH-1 RN      [place]   Status of service:  In process, will continue to follow Medicare Important Message given?  NO (If response is "NO", the following Medicare IM given date fields will be blank) Date Medicare IM given:   Date Additional Medicare IM given:    Discharge Disposition:    Per UR Regulation:  Reviewed for med. necessity/level of care/duration of stay  If discussed at [place] of Stay Meetings, dates discussed:    Comments:  09/26/13 [place], RN, BSN, [employer] [phone] Spoke with pt at bedside regarding discharge planning for [facility]. Offered pt list of home health agencies to choose from.  Pt chose [facility] to render services of RN and telehealth for weight monitoring. Christa See, RN of [facility] notified.  No DME needs identified at this time.  09/25/2013 Hx/o transfer from [facility]:  Acute Systolic HF, CKD Stage III, HBP, DM type II, Obesity (205 lbs), Cardiomyopathy. Left heart cath procedure 09/25/2013 IV Lasix 40 bid BP med titration [employer] RN, BSN, [place], CCM [phone] 09/25/2013

## 2013-09-26 NOTE — Discharge Summary (Signed)
CARDIOLOGY DISCHARGE SUMMARY   Patient ID: Teresa Poole MRN: [id number] DOB/AGE: 62/28/1952 62 y.o.  Admit date: 09/18/2013 Discharge date: 09/26/2013  PCP: Gennette Pac, MD Primary Cardiologist: JV  Primary Discharge Diagnosis:    Hypertensive urgency  Secondary Discharge Diagnosis:    Diabetes mellitus type II, uncontrolled   Obesity (BMI 30-39.9)   Acute systolic congestive heart failure   Edema   Cardiomyopathy, hypertensive   CKD (chronic kidney disease), stage III   CHF (congestive heart failure)  Procedure Performed:  1. Left Heart Catheterization 2. Selective Coronary Angiography 3. Carotid Dopplers 4. Lexiscan nuclear stress test  Hospital Course: Teresa Poole is a 63 y.o. female with a history of cardiomyopathy, presumed non-ischemic. She had been hospitalized 01/15-18 with CAP, HTN, DM, AKI and CHF. After discharge, she developed more SOB and her weight increased from 91 kg to almost 96 kg. She came to the hospital and was significantly hypertensive, with a BP of 190/77. She was admitted for further evaluation and treatment.  The hypertension was felt to be the primary factor in her symptomatology. Her Norvasc was increased and her BB was changed from metoprolol to Coreg with dose increases as needed for better BP control. Her Bi-Dil was changed to separate Imdure and hydralazine so doses could be increased separately.  Her BP improved with medication adjustments, but she also needed diuresis. She was given IV Lasix as needed for diuresis. Her weight decreased by 9 lbs during her hospital stay. She was educated on a low-sodium ADA diet and will have early followup in the office.   She had an echocardiogram during her previous admission that showed an EF of 30%. She had some neck tightness as well as recurrent CHF, so there was concern for an ischemic component. However, because of her renal function, it was elected to do a stress test, cath if  significantly abnormal. She had a Lexiscan CL on 01/30, results below. It was abnormal and her renal function had increased some with diuresis, but improved with holding Lasix and gently hydration. She was evaluated carefully, but cardiac catheterization was felt appropriate and pt was stable for the procedure on 02/02.  Results are below. She has a non-ischemic cardiomyopathy, felt secondary to hypertensive heart disease. Her renal function is abnormal, so no ACE/ARB for now, consider adding as an outpatient. She is to continue BB and diuretics, and continue to watch her volume carefully.    On 02/03, she was seen by Dr. Radford Pax. Her respiratory status was improved and her BP control was much improved also. A CXR was repeated to follow up on the PNA, results are below. Her infiltrate had resolved and there were no new abnormalities.She was ambulating without chest pain or SOB and considered stable for discharge, to follow up as an outpatient with a TOC appt later this week.   Labs:  Lab Results  Component Value Date   WBC 5.1 09/26/2013   HGB 10.3* 09/26/2013   HCT 31.3* 09/26/2013   MCV 87.4 09/26/2013   PLT 225 09/26/2013    Recent Labs Lab 09/22/13 1434  09/26/13 0535  NA  --   < > 142  K  --   < > 3.9  CL  --   < > 103  CO2  --   < > 24  BUN  --   < > 23  CREATININE  --   < > 1.39*  CALCIUM  --   < > 8.7  PROT 7.4  --   --   BILITOT 0.3  --   --   ALKPHOS 58  --   --   ALT 16  --   --   AST 21  --   --   GLUCOSE  --   < > 91  < > = values in this interval not displayed.  Pro B Natriuretic peptide (BNP)  Date/Time Value Range Status  09/18/2013 11:10 AM 1396.0* 0 - 125 pg/mL Final  09/07/2013  3:34 PM 1402.0* 0 - 125 pg/mL Final    Recent Labs  09/25/13 0422  INR 1.00      Radiology:  Dg Chest 2 View 09/26/2013   CLINICAL DATA:  Cough, pneumonia and chest tightness.  EXAM: CHEST - 2 VIEW  COMPARISON:  DG CHEST 2 VIEW dated 09/18/2013; DG CHEST 2V dated 09/14/2013  FINDINGS:  Previously identified lingular infiltrate has cleared radiographically. There is no evidence of pulmonary edema, consolidation, pneumothorax, nodule or pleural fluid. The bony thorax is unremarkable.  IMPRESSION: Resolution of lingular infiltrate.   Electronically Signed   By: Aletta Edouard M.D.   On: 09/26/2013 12:50   Dg Chest 2 View 09/18/2013   CLINICAL DATA:  SHORTNESS OF BREATH  EXAM: CHEST  2 VIEW  COMPARISON:  DG CHEST 2V dated 09/14/2013; DG CHEST 2 VIEW dated 09/07/2013  FINDINGS: Normal cardiac silhouette. There is faint airspace disease in the lingular lobe similar to comparison exam of 09/14/2013 and improved from 09/07/2013. No pleural fluid. No pneumothorax. Degenerative osteophytosis of the thoracic spine.  IMPRESSION: Persistent lingular pneumonia. Recommend follow-up radiographs to ensure resolution   Electronically Signed   By: Suzy Bouchard M.D.   On: 09/18/2013 11:58   Nm Myocar Multi W/spect W/wall Motion / Ef 09/22/2013   CLINICAL DATA:  Chest pain, diabetes, shortness of breath and CHF.  EXAM: MYOCARDIAL IMAGING WITH SPECT (REST AND PHARMACOLOGIC-STRESS)  GATED LEFT VENTRICULAR WALL MOTION STUDY  LEFT VENTRICULAR EJECTION FRACTION  TECHNIQUE: Standard myocardial SPECT imaging was performed after resting intravenous injection of 10 mCi Tc-85m sestamibi. Subsequently, intravenous infusion of Lexiscan was performed under the supervision of the Cardiology staff. At peak effect of the drug, 30 mCi Tc-38m sestamibi was injected intravenously and standard myocardial SPECT imaging was performed. Quantitative gated imaging was also performed to evaluate left ventricular wall motion, and estimate left ventricular ejection fraction.  COMPARISON:  None.  FINDINGS: Utilizing gated data, the end-diastolic volume is estimated to be 138 mL and the end systolic volume 82 mL. Calculated ejection fraction is 41%.  Gated wall motion analysis shows mild inferolateral hypokinesis. No akinetic or dyskinetic  segments are identified.  SPECT imaging shows inferolateral scar. Scar is also identified in the distal anteroseptal wall and apex. There may be a small region of reversibility at the level of the distal septum. Other than this focal region, no significant inducible ischemia is identified with Lexiscan administration.  IMPRESSION: 1. Mildly depressed ejection fraction with quantitative ejection fraction calculation of 41%. The inferolateral wall shows mild hypokinesis. 2. Inferolateral scar and predominately scar involving the distal anteroseptal wall and apex. There may be a small region of reversibility at the level of the distal septum.   Electronically Signed   By: Aletta Edouard M.D.   On: 09/22/2013 13:24    Cardiac Cath: 09/25/2013 Angiographic Findings:  Left main: Short segment, no obstructive disease.  Left Anterior Descending Artery: Large caliber vessel that courses to the apex. The proximal and mid vessel  has serial 20% stenoses. There are several small diagonal branches.  Circumflex Artery: Large caliber vessel that gives off a large obtuse marginal branch with proximal 30% stenosis. The mid AV groove Circumflex has 30% stenosis.  Right Coronary Artery: Moderate caliber non-dominant vessel with mild mid plaque.  Left Ventricular Angiogram: Deferred.  Impression:  1. Non-obstructive CAD  2. Non-ischemic cardiomyopathy  Recommendations: Will continue diuresis. Continue medical management of CAD.   EKG: 09/20/2013 SR, PVCs Vent. rate 83 BPM PR interval 166 ms QRS duration 100 ms QT/QTc 430/505 ms P-R-T axes 69 -4 106  FOLLOW UP PLANS AND APPOINTMENTS No Known Allergies   Medication List    STOP taking these medications       metoprolol tartrate 25 MG tablet  Commonly known as:  LOPRESSOR      TAKE these medications       amLODipine 10 MG tablet  Commonly known as:  NORVASC  Take 1 tablet (10 mg total) by mouth daily.     aspirin 81 MG EC tablet  Take 1 tablet (81  mg total) by mouth daily.     atorvastatin 20 MG tablet  Commonly known as:  LIPITOR  Take 1 tablet (20 mg total) by mouth daily at 6 PM.     BD PEN NEEDLE NANO U/F 32G X 4 MM Misc  Generic drug:  Insulin Pen Needle     carvedilol 12.5 MG tablet  Commonly known as:  COREG  Take 1 tablet (12.5 mg total) by mouth 2 (two) times daily with a meal.     doxycycline 100 MG tablet  Commonly known as:  VIBRA-TABS  Take 1 tablet (100 mg total) by mouth every 12 (twelve) hours.     fluticasone 50 MCG/ACT nasal spray  Commonly known as:  FLONASE  Place 1 spray into both nostrils daily.     furosemide 40 MG tablet  Commonly known as:  LASIX  Take 1 tablet (40 mg total) by mouth 2 (two) times daily.     guaiFENesin-codeine 100-10 MG/5ML syrup  Take 5 mLs by mouth every 4 (four) hours as needed for cough.     insulin aspart 100 UNIT/ML injection  Commonly known as:  novoLOG  Inject 3-12 Units into the skin 4 (four) times daily -  before meals and at bedtime. Uses a sliding scale.     insulin glargine 100 UNIT/ML injection  Commonly known as:  LANTUS  Inject 20 Units into the skin daily.     isosorbide-hydrALAZINE 20-37.5 MG per tablet  Commonly known as:  BIDIL  Take 2 tablets by mouth 2 (two) times daily.     MUCINEX PO  Take 1 tablet by mouth daily as needed (cold).     nitroGLYCERIN 0.4 MG SL tablet  Commonly known as:  NITROSTAT  Place 1 tablet (0.4 mg total) under the tongue every 5 (five) minutes as needed for chest pain.     oxymetazoline 0.05 % nasal spray  Commonly known as:  AFRIN  Place 1 spray into both nostrils daily as needed for congestion.     sodium chloride 0.65 % Soln nasal spray  Commonly known as:  OCEAN  Place 1 spray into both nostrils daily as needed for congestion.        Discharge Orders   Future Appointments Provider Department Dept Phone   09/28/2013 11:30 AM Sh[facility]on Re[facility] [place] Office [phone]   10/24/2013 3:30 PM  Bernie Covey, RN  [place] [phone]   01/02/2014 11:00 AM Casandra Doffing, MD [place] [phone]   Future Orders Complete By Expires   ([place]) Call MD:  Anytime you have any of the following symptoms: 1) 3 pound weight gain in 24 hours or 5 pounds in 1 week 2) shortness of breath, with or without a dry hacking cough 3) swelling in the hands, feet or stomach 4) if you have to sleep on extra pillows at night in order to breathe.  As directed    Diet - low sodium heart healthy  As directed    Diet Carb Modified  As directed    Increase activity slowly  As directed      Follow-up Information   Follow up with Richardson Dopp, PA-C On 09/28/2013. (at 11:30 am)    Specialty:  Physician Assistant   Contact information:   [id number] [place] [address] [phone]       Follow up with [facility]. Investment banker, corporate)    Contact information:   [address] [place] [address] [phone]       BRING ALL MEDICATIONS WITH YOU TO FOLLOW UP APPOINTMENTS  Time spent with patient to include physician time: 48 min Signed: Rosaria Ferries, PA-C 09/26/2013, 1:54 PM Co-Sign MD

## 2013-09-26 NOTE — Progress Notes (Signed)
Patient Name: Teresa Poole Date of Encounter: 09/26/2013  Principal Problem:   Hypertensive urgency Active Problems:   Diabetes mellitus type II, uncontrolled   Obesity (BMI 30-39.9)   Acute systolic congestive heart failure   Edema   Cardiomyopathy, hypertensive   CKD (chronic kidney disease), stage III   CHF (congestive heart failure)    SUBJECTIVE: Pt has not had chest pain at all. SOB is better. Cath site OK. Feels well enough to go home.  OBJECTIVE Filed Vitals:   09/25/13 2106 09/26/13 0504 09/26/13 0510 09/26/13 0845  BP: 141/60 139/60 139/60 146/60  Pulse: 86  80 80  Temp: 98.4 F (36.9 C)  98.2 F (36.8 C)   TempSrc: Oral  Oral   Resp: 18  18   Height:      Weight:   201 lb 9.6 oz (91.445 kg)   SpO2: 93%  99%     Intake/Output Summary (Last 24 hours) at 09/26/13 0854 Last data filed at 09/26/13 0840  Gross per 24 hour  Intake    483 ml  Output   1770 ml  Net  -1287 ml   Filed Weights   09/23/13 0503 09/24/13 0500 09/26/13 0510  Weight: 207 lb 9.6 oz (94.167 kg) 205 lb 6.4 oz (93.169 kg) 201 lb 9.6 oz (91.445 kg)    PHYSICAL EXAM General: Well developed, well nourished, female in no acute distress. Head: Normocephalic, atraumatic.  Neck: Supple without bruits, JVD 8-9 cm Lungs:  Resp regular and unlabored, slightly decreased BS bases but good air exchange. Heart: RRR, S1, S2, no S3, S4, soft systolic murmur; no rub. Abdomen: Soft, non-tender, non-distended, BS + x 4.  Extremities: No clubbing, cyanosis, no edema. Right radial site without ecchymosis or hematoma Neuro: Alert and oriented X 3. Moves all extremities spontaneously. Psych: Normal affect.  LABS: CBC: Recent Labs  09/24/13 0414 09/26/13 0535  WBC 4.9 5.1  HGB 9.7* 10.3*  HCT 29.5* 31.3*  MCV 87.5 87.4  PLT 236 225   INR: Recent Labs  09/25/13 0422  INR [id number]   Basic Metabolic Panel: Recent Labs  09/25/13 0422 09/26/13 0535  NA 144 142  K 3.8 3.9  CL 106 103    CO2 22 24  GLUCOSE 101* 91  BUN 25* 23  CREATININE 1.21* 1.39*  CALCIUM 8.8 8.7   BNP: Pro B Natriuretic peptide (BNP)  Date/Time Value Range Status  09/18/2013 11:10 AM 1396.0* 0 - 125 pg/mL Final  09/07/2013  3:34 PM 1402.0* 0 - 125 pg/mL Final   Lab Results  Component Value Date   HGBA1C 15.0* 09/07/2013   TELE:  SR  Radiology/Studies: Dg Chest 2 View 09/18/2013   CLINICAL DATA:  SHORTNESS OF BREATH  EXAM: CHEST  2 VIEW  COMPARISON:  DG CHEST 2V dated 09/14/2013; DG CHEST 2 VIEW dated 09/07/2013  FINDINGS: Normal cardiac silhouette. There is faint airspace disease in the lingular lobe similar to comparison exam of 09/14/2013 and improved from 09/07/2013. No pleural fluid. No pneumothorax. Degenerative osteophytosis of the thoracic spine.  IMPRESSION: Persistent lingular pneumonia. Recommend follow-up radiographs to ensure resolution   Electronically Signed   By: Suzy Bouchard M.D.   On: 09/18/2013 11:58    Current Medications:  . amLODipine  10 mg Oral Daily  . aspirin EC  81 mg Oral Daily  . atorvastatin  20 mg Oral q1800  . carvedilol  12.5 mg Oral BID WC  . fluticasone  1 spray Each Nare  Daily  . furosemide  40 mg Intravenous Q12H  . heparin  5,000 Units Subcutaneous Q8H  . hydrALAZINE  50 mg Oral Q8H  . insulin aspart  0-15 Units Subcutaneous TID WC  . insulin glargine  20 Units Subcutaneous Daily  . isosorbide mononitrate  60 mg Oral Daily  . sodium chloride  3 mL Intravenous Q12H      ASSESSMENT AND PLAN: Principal Problem:   Hypertensive urgency - improved with medication changes. She was off-schedule yesterday for cath. MD advise changes, o/w continue current Rx and encourage f/u plus home BP checks as OP.  Active Problems:   Diabetes mellitus type II, uncontrolled - A1c 15 3 weeks ago, encourage diet/med compliance and f/u with primary MD    Obesity (BMI 30-39.9) - see above   Acute systolic congestive heart failure - encourage compliance with Rx and low-Na  diet, daily weights    Edema - see above    Cardiomyopathy, hypertensive - follow, on BB, not currently on ACE/ARB due to CKD, diuresis and dye use.    CKD (chronic kidney disease), stage III - Peak Cr this admit 1.49, improved some but still stage III, follow    CHF (congestive heart failure) - acute on chronic systolic, diastolic component unclear. From echo 01/16: Left ventricle: The cavity size was normal. Wall thickness was increased in a pattern of mild LVH. Systolic function was moderately to severely reduced. The estimated ejection fraction was in the range of 30% to 35%. Wall motion was normal; there were no regional wall motion abnormalities. Early diastolic septal annular tissue Doppler velocities Ea were abnormal.   MD advise if should be combined CHF or just systolic. Right heart cath not performed.  Plan: MD to review, pt still on IV Lasix, could change to po, possible d/c today, has f/u appt 02/05 with Richardson Dopp, PAC; would keep that as TOC appt.  Jonetta Speak , PA-C 8:54 AM 09/26/2013

## 2013-09-26 NOTE — Progress Notes (Signed)
DC IV, DC Tele, DC Home. Discharge instructions and home medications discussed with patient and patient's family members. Patient and family denied any questions or concerns at this time. Patient leaving unit via wheelchair and appears in no acute distress.  

## 2013-09-26 NOTE — Discharge Summary (Signed)
Agree with above discharge summary. Patient stable at discharge with no evidence of CHF on exam.  Will have close followup of CHF in office.

## 2013-09-26 NOTE — Discharge Instructions (Signed)
PLEASE REMEMBER TO BRING ALL OF YOUR MEDICATIONS TO EACH OF YOUR FOLLOW-UP OFFICE VISITS. ° °PLEASE ATTEND ALL SCHEDULED FOLLOW-UP APPOINTMENTS.  ° °Activity: Increase activity slowly as tolerated. You may shower, but no soaking baths (or swimming) for 1 week. No driving for 2 days. No lifting over 5 lbs for 1 week. No sexual activity for 1 week.  ° °You May Return to Work: in 1 week (if applicable) ° °Wound Care: You may wash cath site gently with soap and water. Keep cath site clean and dry. If you notice pain, swelling, bleeding or pus at your cath site, please call [phone]. ° ° ° °Cardiac Cath Site Care °Refer to this sheet in the next few weeks. These instructions provide you with information on caring for yourself after your procedure. Your caregiver may also give you more specific instructions. Your treatment has been planned according to current medical practices, but problems sometimes occur. Call your caregiver if you have any problems or questions after your procedure. °HOME CARE INSTRUCTIONS °· You may shower 24 hours after the procedure. Remove the bandage (dressing) and gently wash the site with plain soap and water. Gently pat the site dry.  °· Do not apply powder or lotion to the site.  °· Do not sit in a bathtub, swimming pool, or whirlpool for 5 to 7 days.  °· No bending, squatting, or lifting anything over 10 pounds (4.5 kg) as directed by your caregiver.  °· Inspect the site at least twice daily.  °· Do not drive home if you are discharged the same day of the procedure. Have someone else drive you.  °· You may drive 24 hours after the procedure unless otherwise instructed by your caregiver.  °What to expect: °· Any bruising will usually fade within 1 to 2 weeks.  °· Blood that collects in the tissue (hematoma) may be painful to the touch. It should usually decrease in size and tenderness within 1 to 2 weeks.  °SEEK IMMEDIATE MEDICAL CARE IF: °· You have unusual pain at the site or down the  affected limb.  °· You have redness, warmth, swelling, or pain at the site.  °· You have drainage (other than a small amount of blood on the dressing).  °· You have chills.  °· You have a fever or persistent symptoms for more than 72 hours.  °· You have a fever and your symptoms suddenly get worse.  °· Your leg becomes pale, cool, tingly, or numb.  °· You have heavy bleeding from the site. Hold pressure on the site.  °Document Released: 09/12/2010 Document Revised: 07/30/2011 Document Reviewed: 09/12/2010 °ExitCare® Patient Information ©2012 [employer]. ° °

## 2013-09-26 NOTE — Progress Notes (Signed)
Nutrition Education Note  RD consulted for nutrition education regarding new onset CHF.  RD provided "Low Sodium Nutrition Therapy" handout from the Academy of [employer]. Reviewed patient's dietary recall. Provided examples on ways to decrease sodium intake in diet. Discouraged intake of processed foods and use of salt shaker. Encouraged fresh fruits and vegetables as well as whole grain sources of carbohydrates to maximize fiber intake.   RD discussed why it is important for patient to adhere to diet recommendations, and emphasized the role of fluids, foods to avoid, and importance of weighing self daily. Teach back method used. Pt states she will decrease her intake of soup, vienna sausages, and Gatorade.   Expect good compliance.  Body mass index is 32.55 kg/(m^2). Pt meets criteria for Obesity based on current BMI.  Current diet order is Heart Healthy, patient is consuming approximately 100% of meals at this time. Labs and medications reviewed. No further nutrition interventions warranted at this time. RD contact information provided- encouraged pt to call with any additional questions. If additional nutrition issues arise, please re-consult RD.   Pryor Ochoa RD, LDN Inpatient Clinical Dietitian Pager: [phone] After Hours Pager: [phone]

## 2013-09-26 NOTE — Progress Notes (Signed)
Patient seen, interviewed and examined.  She is feeling good. She has a little nasal congestion but no SOB.  Exam shows some very mild edema but her lungs are clear.  Cath yesterday showed minimal nonobstructive ASCAD with elevated LVEDP c/w combined systolic/diastolic CHF.  She has received several doses of IV Lasix.  She is breathing well and lungs are clear.  Will change her to Lasix 40mg  PO BID.  Recheck chest xray due to persistent abnormality in the lingular region.  D/C home today.  Early followup with PA in office in 2 days.

## 2013-09-26 NOTE — Progress Notes (Signed)
I visited with Ms. Featherston this am.  She is very difficult to interact with and educate secondary to her dominating the conversation.  She says that she was severely SOB on admission and I was able to correlate that to signs and symptoms of heart failure.  She lives alone with some minimal help from her sister.  She says that she works long hours and frequently does not eat.  I did review the importance of daily weights.  She admits that she does not own a scale.  I will check with care management about getting her [facility] for some follow up education at home.  Carole Binning RN, BSN, PCCN --Heart Failure Art therapist

## 2013-09-28 ENCOUNTER — Telehealth: Payer: Self-pay | Admitting: *Deleted

## 2013-09-28 ENCOUNTER — Encounter: Payer: Self-pay | Admitting: Physician Assistant

## 2013-09-28 ENCOUNTER — Ambulatory Visit (INDEPENDENT_AMBULATORY_CARE_PROVIDER_SITE_OTHER): Payer: BC Managed Care – PPO | Admitting: Physician Assistant

## 2013-09-28 VITALS — BP 126/66 | HR 81 | Ht 66.0 in | Wt 200.0 lb

## 2013-09-28 DIAGNOSIS — I428 Other cardiomyopathies: Secondary | ICD-10-CM

## 2013-09-28 DIAGNOSIS — N183 Chronic kidney disease, stage 3 unspecified: Secondary | ICD-10-CM

## 2013-09-28 DIAGNOSIS — I10 Essential (primary) hypertension: Secondary | ICD-10-CM

## 2013-09-28 DIAGNOSIS — I5042 Chronic combined systolic (congestive) and diastolic (congestive) heart failure: Secondary | ICD-10-CM | POA: Insufficient documentation

## 2013-09-28 DIAGNOSIS — I119 Hypertensive heart disease without heart failure: Secondary | ICD-10-CM

## 2013-09-28 DIAGNOSIS — I5022 Chronic systolic (congestive) heart failure: Secondary | ICD-10-CM | POA: Insufficient documentation

## 2013-09-28 DIAGNOSIS — I110 Hypertensive heart disease with heart failure: Secondary | ICD-10-CM

## 2013-09-28 DIAGNOSIS — I43 Cardiomyopathy in diseases classified elsewhere: Secondary | ICD-10-CM

## 2013-09-28 DIAGNOSIS — I509 Heart failure, unspecified: Secondary | ICD-10-CM

## 2013-09-28 DIAGNOSIS — I2510 Atherosclerotic heart disease of native coronary artery without angina pectoris: Secondary | ICD-10-CM

## 2013-09-28 DIAGNOSIS — E785 Hyperlipidemia, unspecified: Secondary | ICD-10-CM

## 2013-09-28 LAB — BASIC METABOLIC PANEL
BUN: 30 mg/dL — ABNORMAL HIGH (ref 6–23)
CHLORIDE: 104 meq/L (ref 96–112)
CO2: 29 meq/L (ref 19–32)
CREATININE: 1.5 mg/dL — AB (ref 0.4–1.2)
Calcium: 9.1 mg/dL (ref 8.4–10.5)
GFR: 43.94 mL/min — ABNORMAL LOW (ref >60.00)
Glucose, Bld: 185 mg/dL — ABNORMAL HIGH (ref 70–99)
Potassium: 4.1 mEq/L (ref 3.5–5.1)
SODIUM: 139 meq/L (ref 135–145)

## 2013-09-28 MED ORDER — FUROSEMIDE 40 MG PO TABS: 40.0000 mg | ORAL_TABLET | Freq: Every day | ORAL | Status: DC

## 2013-09-28 NOTE — Progress Notes (Signed)
[address], [place], [place]  [address] Phone: [phone] Fax:  [phone]  Date:  09/28/2013   ID:  Memorie, Haydon March 05, 1951, MRN [id number]  PCP:  Gennette Pac, MD  Cardiologist:  Dr. Casandra Doffing     History of Present Illness: Teresa Poole is a 63 y.o. female with a hx of HTN, DM, nonischemic cardiomyopathy. Ejection fraction had returned to normal by most recent echocardiogram 11/2012.  Admitted 1/15-1/18 with community-acquired pneumonia.  She had been off of her hypertensive medications for at least 3 months. She had an abnormal troponin. The remainder of the troponins were normal. This was not felt to represent ACS.  Her EF was again reduced at 30-35%.  She did require diuresis. She did develop AKI in the setting of diuresis.  ACEI was held.  Admitted again 1/26-2/3 with a/c combined systolic and diastolic CHF in the setting of hypertensive urgency. She had symptoms of chest discomfort and dyspnea.  She had worsening creatinine with diuresis.  Myoview was initially performed. This demonstrated inferolateral scar, possible small region of ischemia in the distal septum and EF 41%.  She was hydrated with improvement in creatinine and she ultimately underwent LHC which demonstrated nonobstructive CAD. This confirms nonischemic cardiomyopathy.  LVEDP was elevated and she was diuresed again post cath.  CXR demonstrated resolution of lingular infiltrate.    Echocardiogram (09/08/2013): Mild LVH, EF 30-35%, no WMA, mild LAE. Carotid US (09/26/2011): Bilateral 1-39% ICA. LHC (09/25/2013): Proximal and mid LAD serial 20%, proximal circumflex 30%, mid AV groove circumflex 30%, mid RCA mild plaque.  Since discharge, she has been stable. Her weight has remained the same. She denies chest pain, shortness of breath, syncope, orthopnea, PND or edema.  Recent Labs: 09/18/2013: Pro B Natriuretic peptide (BNP) 1396.0*  09/22/2013: ALT 16  09/26/2013: Creatinine 1.39*;  Hemoglobin 10.3*; Potassium 3.9   Wt Readings from Last 3 Encounters:  09/26/13 201 lb 9.6 oz (91.445 kg)  09/26/13 201 lb 9.6 oz (91.445 kg)  09/14/13 209 lb 12.8 oz (95.165 kg)     Past Medical History  Diagnosis Date  . Diabetes mellitus without complication   . Hypertension   . History of ovarian cancer   . Obesity (BMI 30-39.9)   . Chronic combined systolic and diastolic CHF (congestive heart failure)   . NICM (nonischemic cardiomyopathy)     a. Echocardiogram (09/08/2013): Mild LVH, EF 30-35%, no WMA, mild LAE.  Marland Kitchen CAD (coronary artery disease)     non-obstructive by LHC (09/25/2013): Proximal and mid LAD serial 20%, proximal circumflex 30%, mid AV groove circumflex 30%, mid RCA mild plaque.  . Carotid stenosis     a. Carotid US (09/26/2011): Bilateral 1-39% ICA.  Marland Kitchen CKD (chronic kidney disease)   . Hyperlipidemia     Current Outpatient Prescriptions  Medication Sig Dispense Refill  . amLODipine (NORVASC) 10 MG tablet Take 1 tablet (10 mg total) by mouth daily.  30 tablet  11  . aspirin EC 81 MG EC tablet Take 1 tablet (81 mg total) by mouth daily.  90 tablet  0  . atorvastatin (LIPITOR) 20 MG tablet Take 1 tablet (20 mg total) by mouth daily at 6 PM.  30 tablet  0  . BD PEN NEEDLE NANO U/F 32G X 4 MM MISC       . carvedilol (COREG) 12.5 MG tablet Take 1 tablet (12.5 mg total) by mouth 2 (two) times daily with a meal.  60 tablet  11  . doxycycline (VIBRA-TABS) 100 MG tablet Take 1 tablet (100 mg total) by mouth every 12 (twelve) hours.  10 tablet  0  . fluticasone (FLONASE) 50 MCG/ACT nasal spray Place 1 spray into both nostrils daily.      . furosemide (LASIX) 40 MG tablet Take 1 tablet (40 mg total) by mouth 2 (two) times daily.  60 tablet  11  . GuaiFENesin (MUCINEX PO) Take 1 tablet by mouth daily as needed (cold).      Marland Kitchen guaiFENesin-codeine 100-10 MG/5ML syrup Take 5 mLs by mouth every 4 (four) hours as needed for cough.  120 mL  0  . insulin aspart (NOVOLOG) 100 UNIT/ML  injection Inject 3-12 Units into the skin 4 (four) times daily -  before meals and at bedtime. Uses a sliding scale.      . insulin glargine (LANTUS) 100 UNIT/ML injection Inject 20 Units into the skin daily.      . isosorbide-hydrALAZINE (BIDIL) 20-37.5 MG per tablet Take 2 tablets by mouth 2 (two) times daily.  120 tablet  11  . nitroGLYCERIN (NITROSTAT) 0.4 MG SL tablet Place 1 tablet (0.4 mg total) under the tongue every 5 (five) minutes as needed for chest pain.  25 tablet  3  . oxymetazoline (AFRIN) 0.05 % nasal spray Place 1 spray into both nostrils daily as needed for congestion.      . sodium chloride (OCEAN) 0.65 % SOLN nasal spray Place 1 spray into both nostrils daily as needed for congestion.       No current facility-administered medications for this visit.    Allergies:   Review of patient's allergies indicates no known allergies.   Social History:  The patient  reports that she has never smoked. She has never used smokeless tobacco. She reports that she does not drink alcohol or use illicit drugs.   Family History:  The patient's family history is not on file.   ROS:  Please see the history of present illness.   She continues to have a cough with white sputum production. She denies fever.   All other systems reviewed and negative.   PHYSICAL EXAM: VS:  BP 126/66  Pulse 81  Ht 5\' 6"  (1.676 m)  Wt 200 lb (90.719 kg)  BMI 32.30 kg/m2 Well nourished, well developed, in no acute distress HEENT: normal Neck: no JVD Cardiac:  normal S1, S2; RRR; no murmur Lungs:  clear to auscultation bilaterally, no wheezing, rhonchi or rales Abd: soft, nontender, no hepatomegaly Ext: no edemaright wrist without hematoma or mass  Skin: warm and dry Neuro:  CNs 2-12 intact, no focal abnormalities noted  EKG:  NSR, HR 81, normal axis, lateral T wave inversions     ASSESSMENT AND PLAN:  1. Chronic Combined Systolic and Diastolic CHF: Volume remains stable. Continue current regimen. Check  follow up basic metabolic panel today. 2. Nonischemic Cardiomyopathy: She had minimal plaque by cardiac catheterization. Continue beta blocker, hydralazine, nitrates. CKD has limited use of ACEI. Recheck basic metabolic panel today. If her renal function stabilizes, consider resuming ACEI.  She will likely need f/u echo in 3 months.   3. Chronic Kidney Disease: Check follow up basic metabolic panel today.  4. Hypertensive Heart Disease: Much better controlled. Continue current regimen. 5. Hyperlipidemia: Continue statin. 6. Pneumonia: Resolved. 7. Disposition: Follow up with Dr. Casandra Doffing or me in 1 month.   Signed, Richardson Dopp, PA-C  09/28/2013 11:02 AM

## 2013-09-28 NOTE — Telephone Encounter (Signed)
pt notified about lab results and to decrease lasix 40 mg daily, bmet 2/13. Pt verbalized understanding to Plan of Care

## 2013-09-28 NOTE — Patient Instructions (Signed)
LAB WORK TODAY; BMET  Your physician recommends that you continue on your current medications as directed. Please refer to the Current Medication list given to you today.   Your physician recommends that you schedule a follow-up appointment in: [place] DR. VARANASI OR SCOTT WEAVER, PAC SAME DAY DR. Irish Lack IS IN THE OFFICE

## 2013-10-06 ENCOUNTER — Other Ambulatory Visit (INDEPENDENT_AMBULATORY_CARE_PROVIDER_SITE_OTHER): Payer: BC Managed Care – PPO

## 2013-10-06 ENCOUNTER — Telehealth: Payer: Self-pay | Admitting: *Deleted

## 2013-10-06 DIAGNOSIS — I119 Hypertensive heart disease without heart failure: Secondary | ICD-10-CM

## 2013-10-06 DIAGNOSIS — I110 Hypertensive heart disease with heart failure: Secondary | ICD-10-CM

## 2013-10-06 DIAGNOSIS — I43 Cardiomyopathy in diseases classified elsewhere: Secondary | ICD-10-CM

## 2013-10-06 DIAGNOSIS — I428 Other cardiomyopathies: Secondary | ICD-10-CM

## 2013-10-06 DIAGNOSIS — I10 Essential (primary) hypertension: Secondary | ICD-10-CM

## 2013-10-06 DIAGNOSIS — I509 Heart failure, unspecified: Secondary | ICD-10-CM

## 2013-10-06 LAB — BASIC METABOLIC PANEL
BUN: 28 mg/dL — ABNORMAL HIGH (ref 6–23)
CO2: 27 mEq/L (ref 19–32)
CREATININE: 1.7 mg/dL — AB (ref 0.4–1.2)
Calcium: 9.2 mg/dL (ref 8.4–10.5)
Chloride: 98 mEq/L (ref 96–112)
GFR: 38.93 mL/min — ABNORMAL LOW (ref >60.00)
Glucose, Bld: 297 mg/dL — ABNORMAL HIGH (ref 70–99)
Potassium: 4.1 mEq/L (ref 3.5–5.1)
Sodium: 136 mEq/L (ref 135–145)

## 2013-10-06 MED ORDER — FUROSEMIDE 40 MG PO TABS: 20.0000 mg | ORAL_TABLET | Freq: Every day | ORAL | Status: DC

## 2013-10-06 NOTE — Telephone Encounter (Signed)
pt notified about lab results and to hold lasix x 1 day then resume at lasix 20 mg daily, bmet 2/20. Pt advised to weigh dailya and if wt up 2-3 lb's x 1 day or 5 lb x 1 wk then go back to 40 mg lasix. Pt verbalized understanding to Plan of Care.

## 2013-10-13 ENCOUNTER — Other Ambulatory Visit (INDEPENDENT_AMBULATORY_CARE_PROVIDER_SITE_OTHER): Payer: BC Managed Care – PPO

## 2013-10-13 DIAGNOSIS — I43 Cardiomyopathy in diseases classified elsewhere: Secondary | ICD-10-CM

## 2013-10-13 DIAGNOSIS — I428 Other cardiomyopathies: Secondary | ICD-10-CM

## 2013-10-13 DIAGNOSIS — I110 Hypertensive heart disease with heart failure: Secondary | ICD-10-CM

## 2013-10-13 DIAGNOSIS — I509 Heart failure, unspecified: Secondary | ICD-10-CM

## 2013-10-13 DIAGNOSIS — I10 Essential (primary) hypertension: Secondary | ICD-10-CM

## 2013-10-13 DIAGNOSIS — I119 Hypertensive heart disease without heart failure: Secondary | ICD-10-CM

## 2013-10-13 LAB — BASIC METABOLIC PANEL
BUN: 30 mg/dL — ABNORMAL HIGH (ref 6–23)
CALCIUM: 9.3 mg/dL (ref 8.4–10.5)
CO2: 29 mEq/L (ref 19–32)
Chloride: 101 mEq/L (ref 96–112)
Creatinine, Ser: 1.6 mg/dL — ABNORMAL HIGH (ref 0.4–1.2)
GFR: 42.34 mL/min — AB (ref >60.00)
Glucose, Bld: 187 mg/dL — ABNORMAL HIGH (ref 70–99)
Potassium: 4.2 mEq/L (ref 3.5–5.1)
SODIUM: 138 meq/L (ref 135–145)

## 2013-10-16 ENCOUNTER — Other Ambulatory Visit (HOSPITAL_COMMUNITY): Payer: Self-pay | Admitting: Interventional Cardiology

## 2013-10-16 ENCOUNTER — Other Ambulatory Visit: Payer: Self-pay | Admitting: *Deleted

## 2013-10-16 MED ORDER — ATORVASTATIN CALCIUM 20 MG PO TABS
20.0000 mg | ORAL_TABLET | Freq: Every day | ORAL | Status: DC
Start: 2013-10-16 — End: 2013-10-26

## 2013-10-16 MED ORDER — FUROSEMIDE 20 MG PO TABS: 20.0000 mg | ORAL_TABLET | Freq: Every day | ORAL | Status: DC

## 2013-10-18 ENCOUNTER — Other Ambulatory Visit: Payer: Self-pay | Admitting: Family Medicine

## 2013-10-18 DIAGNOSIS — N6310 Unspecified lump in the right breast, unspecified quadrant: Secondary | ICD-10-CM

## 2013-10-24 ENCOUNTER — Ambulatory Visit: Payer: BC Managed Care – PPO | Admitting: *Deleted

## 2013-10-26 ENCOUNTER — Encounter: Payer: Self-pay | Admitting: Interventional Cardiology

## 2013-10-26 ENCOUNTER — Ambulatory Visit (INDEPENDENT_AMBULATORY_CARE_PROVIDER_SITE_OTHER): Payer: BC Managed Care – PPO | Admitting: Interventional Cardiology

## 2013-10-26 VITALS — BP 140/80 | HR 104 | Ht 66.0 in | Wt 209.0 lb

## 2013-10-26 DIAGNOSIS — I428 Other cardiomyopathies: Secondary | ICD-10-CM

## 2013-10-26 DIAGNOSIS — E1169 Type 2 diabetes mellitus with other specified complication: Secondary | ICD-10-CM | POA: Insufficient documentation

## 2013-10-26 DIAGNOSIS — E782 Mixed hyperlipidemia: Secondary | ICD-10-CM

## 2013-10-26 HISTORY — DX: Other cardiomyopathies: I42.8

## 2013-10-26 NOTE — Patient Instructions (Signed)
Your physician has requested that you have an echocardiogram. Echocardiography is a painless test that uses sound waves to create images of your heart. It provides your doctor with information about the size and shape of your heart and how well your heart's chambers and valves are working. This procedure takes approximately one hour. There are no restrictions for this procedure.  Your physician recommends that you schedule a follow-up appointment in: 3 months with Dr. Irish Lack.  Your physician recommends that you continue on your current medications as directed. Please refer to the Current Medication list given to you today.

## 2013-10-26 NOTE — Progress Notes (Signed)
Patient ID: Teresa Poole, female   DOB: [birth year], 63 y.o.   MRN: [id number]  [facility], [place], [place]  [address] Phone: [phone] Fax:  [phone]  Date:  10/26/2013   ID:  Teresa Poole 04-28-1951, MRN [id number]  PCP:  Gennette Pac, MD  Cardiologist:  Dr. Casandra Doffing     History of Present Illness: Teresa Poole is a 63 y.o. female with a hx of HTN, DM, nonischemic cardiomyopathy. Ejection fraction had returned to normal by most recent echocardiogram 11/2012.  Admitted 1/15-1/18 with community-acquired pneumonia.  She had been off of her hypertensive medications for at least 3 months. She had an abnormal troponin. The remainder of the troponins were normal. This was not felt to represent ACS.  Her EF was again reduced at 30-35%.  She did require diuresis. She did develop AKI in the setting of diuresis.  ACEI was held.  Admitted again 1/26-2/3 with a/c combined systolic and diastolic CHF in the setting of hypertensive urgency. She had symptoms of chest discomfort and dyspnea.  She had worsening creatinine with diuresis.  Myoview was initially performed. This demonstrated inferolateral scar, possible small region of ischemia in the distal septum and EF 41%.  She was hydrated with improvement in creatinine and she ultimately underwent LHC which demonstrated nonobstructive CAD. This confirms nonischemic cardiomyopathy.  LVEDP was elevated and she was diuresed again post cath.  CXR demonstrated resolution of lingular infiltrate.    Echocardiogram (09/08/2013): Mild LVH, EF 30-35%, no WMA, mild LAE. Carotid US (09/26/2011): Bilateral 1-39% ICA. LHC (09/25/2013): Proximal and mid LAD serial 20%, proximal circumflex 30%, mid AV groove circumflex 30%, mid RCA mild plaque.  She has been stable. Her weight has remained the same. She denies chest pain, shortness of breath, syncope, orthopnea, PND or edema.  Blood sugar has been controlled recently.  BP hsa been  between [id number] systolic. Better after taking meds. Mild edema at night, resolved by morning.  Recent Labs: 09/18/2013: Pro B Natriuretic peptide (BNP) 1396.0*  09/22/2013: ALT 16  09/26/2013: Hemoglobin 10.3*  10/13/2013: Creatinine 1.6*; Potassium 4.2   Wt Readings from Last 3 Encounters:  10/26/13 209 lb (94.802 kg)  09/28/13 200 lb (90.719 kg)  09/26/13 201 lb 9.6 oz (91.445 kg)     Past Medical History  Diagnosis Date  . Diabetes mellitus without complication   . Hypertension   . History of ovarian cancer   . Obesity (BMI 30-39.9)   . Chronic combined systolic and diastolic CHF (congestive heart failure)   . NICM (nonischemic cardiomyopathy)     a. Echocardiogram (09/08/2013): Mild LVH, EF 30-35%, no WMA, mild LAE.  Marland Kitchen CAD (coronary artery disease)     non-obstructive by LHC (09/25/2013): Proximal and mid LAD serial 20%, proximal circumflex 30%, mid AV groove circumflex 30%, mid RCA mild plaque.  . Carotid stenosis     a. Carotid US (09/26/2011): Bilateral 1-39% ICA.  Marland Kitchen CKD (chronic kidney disease)   . Hyperlipidemia     Current Outpatient Prescriptions  Medication Sig Dispense Refill  . amLODipine (NORVASC) 10 MG tablet Take 1 tablet (10 mg total) by mouth daily.  30 tablet  11  . aspirin EC 81 MG EC tablet Take 1 tablet (81 mg total) by mouth daily.  90 tablet  0  . atorvastatin (LIPITOR) 20 MG tablet TAKE 1 TABLET BY MOUTH DAILY AT 6PM  30 tablet  2  . BD PEN NEEDLE NANO U/F 32G X  4 MM MISC       . carvedilol (COREG) 12.5 MG tablet Take 1 tablet (12.5 mg total) by mouth 2 (two) times daily with a meal.  60 tablet  11  . doxycycline (VIBRA-TABS) 100 MG tablet Take 1 tablet (100 mg total) by mouth every 12 (twelve) hours.  10 tablet  0  . fluticasone (FLONASE) 50 MCG/ACT nasal spray Place 1 spray into both nostrils daily.      . furosemide (LASIX) 20 MG tablet TAKE 1 TABLET BY MOUTH EVERY DAY AS NEEDED(WEIGHT GAIN 3 LBS IN A DAY OR 5 LBS IN A WEEK)  30 tablet  2  . insulin  aspart (NOVOLOG) 100 UNIT/ML injection Inject 3-12 Units into the skin 4 (four) times daily -  before meals and at bedtime. Uses a sliding scale.      . insulin glargine (LANTUS) 100 UNIT/ML injection Inject 20 Units into the skin daily.      . isosorbide-hydrALAZINE (BIDIL) 20-37.5 MG per tablet Take 2 tablets by mouth 2 (two) times daily.  120 tablet  11  . nitroGLYCERIN (NITROSTAT) 0.4 MG SL tablet Place 1 tablet (0.4 mg total) under the tongue every 5 (five) minutes as needed for chest pain.  25 tablet  3  . oxymetazoline (AFRIN) 0.05 % nasal spray Place 1 spray into both nostrils daily as needed for congestion.      . sodium chloride (OCEAN) 0.65 % SOLN nasal spray Place 1 spray into both nostrils daily as needed for congestion.       No current facility-administered medications for this visit.    Allergies:   Review of patient's allergies indicates no known allergies.   Social History:  The patient  reports that she has never smoked. She has never used smokeless tobacco. She reports that she does not drink alcohol or use illicit drugs.   Family History:  The patient's family history is not on file.   ROS:  Please see the history of present illness.   She continues to have a cough with white sputum production. She denies fever.   All other systems reviewed and negative.   PHYSICAL EXAM: VS:  BP 140/80  Pulse 104  Ht 5\' 6"  (1.676 m)  Wt 209 lb (94.802 kg)  BMI 33.75 kg/m2 Well nourished, well developed, in no acute distress HEENT: normal Neck: no JVD Cardiac:  normal S1, S2; RRR; no murmur Lungs:  clear to auscultation bilaterally, no wheezing, rhonchi or rales Abd: soft, nontender, no hepatomegaly Ext: no edemaright wrist without hematoma or mass  Skin: warm and dry Neuro:  CNs 2-12 intact, no focal abnormalities noted  EKG:  NSR, HR 81, normal axis, lateral T wave inversions     ASSESSMENT AND PLAN:  1. Chronic Combined Systolic and Diastolic CHF: Volume remains stable.  Continue current regimen. Check follow up basic metabolic panel today. 2. Nonischemic Cardiomyopathy: She had minimal plaque by cardiac catheterization. Continue beta blocker, hydralazine, nitrates. CKD has limited use of ACE-I. If her renal function stabilizes, consider resuming ACEI.  She will likely need f/u echo in 3 months.  Cr was 1.6 at last check in 2/15. 3. Chronic Kidney Disease:   4. Hypertensive Heart Disease: Much better controlled. Continue current regimen. Move amlodipine to the evening. 5. Hyperlipidemia: Continue statin. 6. Disposition: f/u in 3 months  Signed, Casandra Doffing, MD, [facility]  10/26/2013 4:09 PM

## 2013-10-31 ENCOUNTER — Ambulatory Visit
Admission: RE | Admit: 2013-10-31 | Discharge: 2013-10-31 | Disposition: A | Discharge status: Home / Self Care | Payer: Self-pay | Source: Ambulatory Visit | Attending: Family Medicine | Admitting: Family Medicine

## 2013-10-31 ENCOUNTER — Other Ambulatory Visit: Payer: Self-pay | Admitting: Family Medicine

## 2013-10-31 ENCOUNTER — Ambulatory Visit
Admission: RE | Admit: 2013-10-31 | Discharge: 2013-10-31 | Disposition: A | Discharge status: Home / Self Care | Payer: BC Managed Care – PPO | Source: Ambulatory Visit | Attending: Family Medicine | Admitting: Family Medicine

## 2013-10-31 DIAGNOSIS — N6310 Unspecified lump in the right breast, unspecified quadrant: Secondary | ICD-10-CM

## 2013-10-31 DIAGNOSIS — N6320 Unspecified lump in the left breast, unspecified quadrant: Secondary | ICD-10-CM

## 2013-11-10 ENCOUNTER — Telehealth: Payer: Self-pay | Admitting: Interventional Cardiology

## 2013-11-10 NOTE — Telephone Encounter (Signed)
C/o nasal congestion, pt was told if nasal saline is not helping she may need to see pcp/ she agreed to plan. Pt states she may need to go on medical leave/ c/o fatigue, denies edema, discussed daily wts/ high sodium foods to avoid. Told her to call with further concerns, she verbalized understanding.

## 2013-11-10 NOTE — Telephone Encounter (Signed)
New message    C/o of a strong amonia spell and taste in her mouth.  It started Wednesday.  Could this be her heart?

## 2013-11-13 NOTE — Telephone Encounter (Signed)
Noted, fyi to Dr. Irish Lack.

## 2013-11-15 ENCOUNTER — Telehealth: Payer: Self-pay | Admitting: Interventional Cardiology

## 2013-11-15 NOTE — Telephone Encounter (Signed)
I received Signed ROI,Debit Card Pmt For FMLA to be Completed. Dr.Varanasi Is Pt's Doctor he does not Charge For  FMLA to be Completed. I have Called Pt let her Know ROI/Debit Card Info has Been shredded and Once FMLA has Been Completed I will call Her So she can Come Pick Up Pt Ok'd  3.25.15/kdm

## 2013-12-09 ENCOUNTER — Ambulatory Visit (INDEPENDENT_AMBULATORY_CARE_PROVIDER_SITE_OTHER): Payer: BC Managed Care – PPO | Admitting: Emergency Medicine

## 2013-12-09 ENCOUNTER — Ambulatory Visit: Payer: BC Managed Care – PPO

## 2013-12-09 VITALS — BP 136/60 | HR 75 | Temp 98.3°F | Resp 16 | Ht 67.0 in | Wt 203.0 lb

## 2013-12-09 DIAGNOSIS — M79609 Pain in unspecified limb: Secondary | ICD-10-CM

## 2013-12-09 DIAGNOSIS — M79672 Pain in left foot: Secondary | ICD-10-CM

## 2013-12-09 NOTE — Progress Notes (Signed)
Subjective:    Patient ID: Teresa Poole, female    DOB: 11-Sep-1950, 64 y.o.   MRN: [id number]  HPI Patient ID: Teresa Poole, female    DOB: September 07, 1950, 63 y.o.   MRN: [id number]  HPI Chief Complaint  Patient presents with  . Foot Swelling    left foot swelling x 2 weeks, painful, redness    This chart was scribed for Arlyss Queen, MD by Thea Alken, ED Scribe. This patient was seen in room 1 and the patient's care was started at 1:38 PM.  HPI Comments: Teresa Poole is a 63 y.o. female who presents to the [facility] complaining of constant, worsening, left foot pain onset 2 weeks. Pt reports that when she had fractured her right foot, she was in a boot for 21 days and was solely depending on her left foot. She describes the pain as severe soreness and notes edema and erythema. She reports for the last 3-5 days her pain has worsened.  Pt reports that she is unable to bear weight to left foot. Pt reports DM.    Past Medical History  Diagnosis Date  . Diabetes mellitus without complication   . Hypertension   . History of ovarian cancer   . Obesity (BMI 30-39.9)   . Chronic combined systolic and diastolic CHF (congestive heart failure)   . NICM (nonischemic cardiomyopathy)     a. Echocardiogram (09/08/2013): Mild LVH, EF 30-35%, no WMA, mild LAE.  Marland Kitchen CAD (coronary artery disease)     non-obstructive by LHC (09/25/2013): Proximal and mid LAD serial 20%, proximal circumflex 30%, mid AV groove circumflex 30%, mid RCA mild plaque.  . Carotid stenosis     a. Carotid US (09/26/2011): Bilateral 1-39% ICA.  Marland Kitchen CKD (chronic kidney disease)   . Hyperlipidemia    No Known Allergies Prior to Admission medications   Medication Sig Start Date End Date Taking? Authorizing Provider  amLODipine (NORVASC) 10 MG tablet Take 1 tablet (10 mg total) by mouth daily. 09/26/13  Yes Rhonda G Barrett, PA-C  aspirin EC 81 MG EC tablet Take 1 tablet (81 mg total) by mouth daily.  09/10/13  Yes Janece Canterbury, MD  atorvastatin (LIPITOR) 20 MG tablet TAKE 1 TABLET BY MOUTH DAILY AT [place], MD  BD PEN NEEDLE NANO U/F 32G X 4 MM MISC  09/10/13  Yes Historical Provider, MD  carvedilol (COREG) 12.5 MG tablet Take 1 tablet (12.5 mg total) by mouth 2 (two) times daily with a meal. 09/26/13  Yes Rhonda G Barrett, PA-C  insulin aspart (NOVOLOG) 100 UNIT/ML injection Inject 3-12 Units into the skin 4 (four) times daily -  before meals and at bedtime. Uses a sliding scale.   Yes Historical Provider, MD  insulin glargine (LANTUS) 100 UNIT/ML injection Inject 20 Units into the skin daily.   Yes Historical Provider, MD  isosorbide-hydrALAZINE (BIDIL) 20-37.5 MG per tablet Take 2 tablets by mouth 2 (two) times daily. 09/26/13  Yes Rhonda G Barrett, PA-C  doxycycline (VIBRA-TABS) 100 MG tablet Take 1 tablet (100 mg total) by mouth every 12 (twelve) hours. 09/10/13   Janece Canterbury, MD  fluticasone (FLONASE) 50 MCG/ACT nasal spray Place 1 spray into both nostrils daily.    Historical Provider, MD  furosemide (LASIX) 20 MG tablet TAKE 1 TABLET BY MOUTH EVERY DAY AS NEEDED(WEIGHT GAIN 3 LBS IN A DAY OR 5 LBS IN A WEEK)    Jettie Booze,  MD  nitroGLYCERIN (NITROSTAT) 0.4 MG SL tablet Place 1 tablet (0.4 mg total) under the tongue every 5 (five) minutes as needed for chest pain. 09/26/13   Evelene Croon Barrett, PA-C  oxymetazoline (AFRIN) 0.05 % nasal spray Place 1 spray into both nostrils daily as needed for congestion.    Historical Provider, MD  sodium chloride (OCEAN) 0.65 % SOLN nasal spray Place 1 spray into both nostrils daily as needed for congestion.    Historical Provider, MD    Review of Systems  Musculoskeletal: Positive for myalgias.  Skin: Negative for rash and wound.       Objective:   Physical Exam CONSTITUTIONAL: Well developed/well nourished HEAD: Normocephalic/atraumatic EYES: EOMI/PERRL ENMT: Mucous membranes moist NECK: supple no meningeal  signs SPINE:entire spine nontender CV: S1/S2 noted, no murmurs/rubs/gallops noted LUNGS: Lungs are clear to auscultation bilaterally, no apparent distress ABDOMEN: soft, nontender, no rebound or guarding GU:no cva tenderness NEURO: Pt is awake/alert, moves all extremitiesx4 EXTREMITIES: pulses normal, full ROM, tender over the metacarpals of the left foot. Edema noted. Great circulation, great pulses SKIN: warm, color normal PSYCH: no abnormalities of mood noted [facility] reading (PRIMARY) by  Dr.Paden Senger there is hypertrophic cortex over the fourth metatarsal. There appears to be healing stress fractures of the second and third metatarsals.      Assessment & Plan:  I suspect the patient has multiple metatarsal stress fractures. We'll place her in a boot rex-ray in 2-3 weeks. She did not show signs of vascular compromise I personally performed the services described in this documentation, which was scribed in my presence. The recorded information has been reviewed and is accurate.

## 2013-12-09 NOTE — Progress Notes (Signed)
Subjective:    Patient ID: Teresa Poole, female    DOB: 03-23-1951, 63 y.o.   MRN: [id number]  HPI Chief Complaint  Patient presents with   Foot Swelling    left foot swelling x 2 weeks, painful, redness    This chart was scribed for Arlyss Queen, MD by Thea Alken, ED Scribe. This patient was seen in room 1 and the patient's care was started at 1:38 PM.  HPI Comments: Teresa Poole is a 63 y.o. female who presents to the [facility] complaining of constant, worsening, left foot pain onset 2 weeks. Pt reports that when she had fractured her right foot, she was in a boot for 21 days and was solely depending on her left foot. She describes the pain as severe soreness and notes edema and erythema. She reports for the last 3-5 days her pain has worsened.  Pt reports that she is unable to bear weight to left foot. Pt reports DM.    Past Medical History  Diagnosis Date   Diabetes mellitus without complication    Hypertension    History of ovarian cancer    Obesity (BMI 30-39.9)    Chronic combined systolic and diastolic CHF (congestive heart failure)    NICM (nonischemic cardiomyopathy)     a. Echocardiogram (09/08/2013): Mild LVH, EF 30-35%, no WMA, mild LAE.   CAD (coronary artery disease)     non-obstructive by LHC (09/25/2013): Proximal and mid LAD serial 20%, proximal circumflex 30%, mid AV groove circumflex 30%, mid RCA mild plaque.   Carotid stenosis     a. Carotid US (09/26/2011): Bilateral 1-39% ICA.   CKD (chronic kidney disease)    Hyperlipidemia    No Known Allergies Prior to Admission medications   Medication Sig Start Date End Date Taking? Authorizing Provider  amLODipine (NORVASC) 10 MG tablet Take 1 tablet (10 mg total) by mouth daily. 09/26/13  Yes Rhonda G Barrett, PA-C  aspirin EC 81 MG EC tablet Take 1 tablet (81 mg total) by mouth daily. 09/10/13  Yes Janece Canterbury, MD  atorvastatin (LIPITOR) 20 MG tablet TAKE 1 TABLET BY MOUTH  DAILY AT [place], MD  BD PEN NEEDLE NANO U/F 32G X 4 MM MISC  09/10/13  Yes Historical Provider, MD  carvedilol (COREG) 12.5 MG tablet Take 1 tablet (12.5 mg total) by mouth 2 (two) times daily with a meal. 09/26/13  Yes Rhonda G Barrett, PA-C  insulin aspart (NOVOLOG) 100 UNIT/ML injection Inject 3-12 Units into the skin 4 (four) times daily -  before meals and at bedtime. Uses a sliding scale.   Yes Historical Provider, MD  insulin glargine (LANTUS) 100 UNIT/ML injection Inject 20 Units into the skin daily.   Yes Historical Provider, MD  isosorbide-hydrALAZINE (BIDIL) 20-37.5 MG per tablet Take 2 tablets by mouth 2 (two) times daily. 09/26/13  Yes Rhonda G Barrett, PA-C  doxycycline (VIBRA-TABS) 100 MG tablet Take 1 tablet (100 mg total) by mouth every 12 (twelve) hours. 09/10/13   Janece Canterbury, MD  fluticasone (FLONASE) 50 MCG/ACT nasal spray Place 1 spray into both nostrils daily.    Historical Provider, MD  furosemide (LASIX) 20 MG tablet TAKE 1 TABLET BY MOUTH EVERY DAY AS NEEDED(WEIGHT GAIN 3 LBS IN A DAY OR 5 LBS IN A WEEK)    Jettie Booze, MD  nitroGLYCERIN (NITROSTAT) 0.4 MG SL tablet Place 1 tablet (0.4 mg total) under the tongue every 5 (  five) minutes as needed for chest pain. 09/26/13   Evelene Croon Barrett, PA-C  oxymetazoline (AFRIN) 0.05 % nasal spray Place 1 spray into both nostrils daily as needed for congestion.    Historical Provider, MD  sodium chloride (OCEAN) 0.65 % SOLN nasal spray Place 1 spray into both nostrils daily as needed for congestion.    Historical Provider, MD   Review of Systems  Musculoskeletal: Positive for myalgias.  Skin: Negative for wound.      Objective:   Physical Exam CONSTITUTIONAL: Well developed/well nourished HEAD: Normocephalic/atraumatic EYES: EOMI/PERRL ENMT: Mucous membranes moist NECK: supple no meningeal signs SPINE:entire spine nontender CV: S1/S2 noted, no murmurs/rubs/gallops noted LUNGS: Lungs are clear to  auscultation bilaterally, no apparent distress ABDOMEN: soft, nontender, no rebound or guarding GU:no cva tenderness NEURO: Pt is awake/alert, moves all extremitiesx4 EXTREMITIES: pulses normal, full ROM, tender over the metacarpals of the left foot. Edema noted. Great circulation, great pulses SKIN: warm, color normal PSYCH: no abnormalities of mood noted [facility] reading (PRIMARY) by  Dr.Daub there is hypertrophic cortex over the fourth metatarsal. There appears to be healing stress fractures of the second and third metatarsals.     Assessment & Plan:  I suspect the patient has multiple metatarsal stress fractures. We'll place her in a boot rex-ray in 2-3 weeks. She did not show signs of vascular compromise I personally performed the services described in this documentation, which was scribed in my presence. The recorded information has been reviewed and is accurate.

## 2013-12-09 NOTE — Addendum Note (Signed)
Addended by: Madie Reno on: 12/09/2013 03:09 PM   Modules accepted: Level of Service

## 2013-12-09 NOTE — Patient Instructions (Signed)
Metatarsal Fracture, Undisplaced A metatarsal fracture is a break in the bone(s) of the foot. These are the bones of the foot that connect your toes to the bones of the ankle. DIAGNOSIS  The diagnoses of these fractures are usually made with X-rays. If there are problems in the forefoot and x-rays are normal a later bone scan will usually make the diagnosis.  TREATMENT AND HOME CARE INSTRUCTIONS  Treatment may or may not include a cast or walking shoe. When casts are needed the use is usually for short periods of time so as not to slow down healing with muscle wasting (atrophy).  Activities should be stopped until further advised by your caregiver.  Wear shoes with adequate shock absorbing capabilities and stiff soles.  Alternative exercise may be undertaken while waiting for healing. These may include bicycling and swimming, or as your caregiver suggests.  It is important to keep all follow-up visits or specialty referrals. The failure to keep these appointments could result in improper bone healing and chronic pain or disability.  Warning: Do not drive a car or operate a motor vehicle until your caregiver specifically tells you it is safe to do so. IF YOU DO NOT HAVE A CAST OR SPLINT:  You may walk on your injured foot as tolerated or advised.  Do not put any weight on your injured foot for as long as directed by your caregiver. Slowly increase the amount of time you walk on the foot as the pain allows or as advised.  Use crutches until you can bear weight without pain. A gradual increase in weight bearing may help.  Apply ice to the injury for 15-20 minutes each hour while awake for the first 2 days. Put the ice in a plastic bag and place a towel between the bag of ice and your skin.  Only take over-the-counter or prescription medicines for pain, discomfort, or fever as directed by your caregiver. SEEK IMMEDIATE MEDICAL CARE IF:   Your cast gets damaged or breaks.  You have  continued severe pain or more swelling than you did before the cast was put on, or the pain is not controlled with medications.  Your skin or nails below the injury turn blue or grey, or feel cold or numb.  There is a bad smell, or new stains or pus-like (purulent) drainage coming from the cast. MAKE SURE YOU:   Understand these instructions.  Will watch your condition.  Will get help right away if you are not doing well or get worse. Document Released: 05/02/2002 Document Revised: 11/02/2011 Document Reviewed: 03/23/2008 ExitCare Patient Information 2014 [employer].  

## 2013-12-11 ENCOUNTER — Telehealth: Payer: Self-pay | Admitting: Interventional Cardiology

## 2013-12-11 NOTE — Telephone Encounter (Signed)
Pt Came In To Check on her FMLA, I checked With Amy and It Could Not Be Located, I [employer] to [place] spoke with Pt Got all Information  From [employer] [place] @ [phone] she Is Going to Fax over Blank copy Of FMLA Once Received I will Forward To Amy/Dr.Varanasi To Complete   4.20.15/kdm

## 2013-12-20 ENCOUNTER — Telehealth: Payer: Self-pay | Admitting: Interventional Cardiology

## 2013-12-20 NOTE — Telephone Encounter (Signed)
Returned pts call. Spoke with pt regarding FMLA paperwork. I will given info to Dr. Irish Lack to see if he is able to fill out FMLA.

## 2013-12-20 NOTE — Telephone Encounter (Signed)
New message     Returned Amy's call

## 2013-12-22 ENCOUNTER — Ambulatory Visit (HOSPITAL_COMMUNITY): Payer: BC Managed Care – PPO | Attending: Internal Medicine | Admitting: Cardiology

## 2013-12-22 DIAGNOSIS — I428 Other cardiomyopathies: Secondary | ICD-10-CM | POA: Insufficient documentation

## 2013-12-22 DIAGNOSIS — I5041 Acute combined systolic (congestive) and diastolic (congestive) heart failure: Secondary | ICD-10-CM | POA: Insufficient documentation

## 2013-12-22 DIAGNOSIS — I509 Heart failure, unspecified: Secondary | ICD-10-CM | POA: Insufficient documentation

## 2013-12-22 NOTE — Progress Notes (Signed)
Limited echo performed. 

## 2013-12-27 ENCOUNTER — Encounter: Payer: Self-pay | Admitting: Interventional Cardiology

## 2013-12-27 ENCOUNTER — Ambulatory Visit (INDEPENDENT_AMBULATORY_CARE_PROVIDER_SITE_OTHER): Payer: BC Managed Care – PPO | Admitting: Interventional Cardiology

## 2013-12-27 VITALS — BP 196/89 | HR 77 | Ht 67.0 in | Wt 205.1 lb

## 2013-12-27 DIAGNOSIS — I509 Heart failure, unspecified: Secondary | ICD-10-CM

## 2013-12-27 DIAGNOSIS — N183 Chronic kidney disease, stage 3 unspecified: Secondary | ICD-10-CM

## 2013-12-27 DIAGNOSIS — F29 Unspecified psychosis not due to a substance or known physiological condition: Secondary | ICD-10-CM

## 2013-12-27 DIAGNOSIS — I428 Other cardiomyopathies: Secondary | ICD-10-CM

## 2013-12-27 DIAGNOSIS — R410 Disorientation, unspecified: Secondary | ICD-10-CM

## 2013-12-27 DIAGNOSIS — E782 Mixed hyperlipidemia: Secondary | ICD-10-CM

## 2013-12-27 DIAGNOSIS — I110 Hypertensive heart disease with heart failure: Secondary | ICD-10-CM

## 2013-12-27 NOTE — Progress Notes (Signed)
Patient ID: Teresa Poole, female   DOB: 1950/09/05, 63 y.o.   MRN: [id number] Patient ID: Teresa Poole, female   DOB: 17-Oct-1950, 63 y.o.   MRN: [id number]     [place], [place], [place]  [address] Phone: [phone] Fax:  [phone]  Date:  12/27/2013   ID:  Teresa Poole, Teresa Poole 1951/04/10, MRN [id number]  PCP:  Gennette Pac, MD  Cardiologist:  Dr. Casandra Doffing     History of Present Illness: Teresa Poole is a 63 y.o. female with a hx of HTN, DM, nonischemic cardiomyopathy. Ejection fraction had returned to normal by most recent echocardiogram 11/2012.  Admitted 1/15-1/18 with community-acquired pneumonia.  She had been off of her hypertensive medications for at least 3 months. She had an abnormal troponin. The remainder of the troponins were normal. This was not felt to represent ACS.  Her EF was again reduced at 30-35%.  She did require diuresis. She did develop AKI in the setting of diuresis.  ACEI was held.  Admitted again 1/26-2/3 with a/c combined systolic and diastolic CHF in the setting of hypertensive urgency. She had symptoms of chest discomfort and dyspnea.  She had worsening creatinine with diuresis.  Myoview was initially performed. This demonstrated inferolateral scar, possible small region of ischemia in the distal septum and EF 41%.  She was hydrated with improvement in creatinine and she ultimately underwent LHC which demonstrated nonobstructive CAD. This confirms nonischemic cardiomyopathy.  LVEDP was elevated and she was diuresed again post cath.  CXR demonstrated resolution of lingular infiltrate.    Echocardiogram (09/08/2013): Mild LVH, EF 30-35%, no WMA, mild LAE. Carotid US (09/26/2011): Bilateral 1-39% ICA. LHC (09/25/2013): Proximal and mid LAD serial 20%, proximal circumflex 30%, mid AV groove circumflex 30%, mid RCA mild plaque.  She had been stable until developing foot pain.  SHe had bilateral foot fractures and she has had  some swelling in hre feet after that time. Marland Kitchen Her weight has remained the same. She denies chest pain, shortness of breath, syncope, orthopnea, PND.  She had felt disoriented for an hour after taking her insulin.  She has had some neck pain.    Blood sugar has been controlled recently.  BP has been between 123456 systolic. Better after taking meds. Mild edema at night, resolved by morning.  She has stopped the BiDil since she thinks that may be contributing to her confusion.  BP was controlled at her other [facility] appts.   She has not taken her meds today.  She has not eaten either.    She does not want to go back to work until her confusion is settled.  Her insulin dose was decreased, but she still has it.    Recent Labs: 09/18/2013: Pro B Natriuretic peptide (BNP) 1396.0*  09/22/2013: ALT 16  09/26/2013: Hemoglobin 10.3*  10/13/2013: Creatinine 1.6*; Potassium 4.2   Wt Readings from Last 3 Encounters:  12/27/13 205 lb 1.9 oz (93.042 kg)  12/09/13 203 lb (92.08 kg)  10/26/13 209 lb (94.802 kg)     Past Medical History  Diagnosis Date  . Diabetes mellitus without complication   . Hypertension   . History of ovarian cancer   . Obesity (BMI 30-39.9)   . Chronic combined systolic and diastolic CHF (congestive heart failure)   . NICM (nonischemic cardiomyopathy)     a. Echocardiogram (09/08/2013): Mild LVH, EF 30-35%, no WMA, mild LAE.  Marland Kitchen CAD (coronary artery disease)  non-obstructive by LHC (09/25/2013): Proximal and mid LAD serial 20%, proximal circumflex 30%, mid AV groove circumflex 30%, mid RCA mild plaque.  . Carotid stenosis     a. Carotid US (09/26/2011): Bilateral 1-39% ICA.  Marland Kitchen CKD (chronic kidney disease)   . Hyperlipidemia     Current Outpatient Prescriptions  Medication Sig Dispense Refill  . aspirin EC 81 MG EC tablet Take 1 tablet (81 mg total) by mouth daily.  90 tablet  0  . atorvastatin (LIPITOR) 20 MG tablet TAKE 1 TABLET BY MOUTH DAILY AT 6PM  30 tablet  2  . BD PEN  NEEDLE NANO U/F 32G X 4 MM MISC       . carvedilol (COREG) 12.5 MG tablet Take 1 tablet (12.5 mg total) by mouth 2 (two) times daily with a meal.  60 tablet  11  . fluticasone (FLONASE) 50 MCG/ACT nasal spray Place 1 spray into both nostrils as needed.       . furosemide (LASIX) 20 MG tablet TAKE 1 TABLET BY MOUTH EVERY DAY AS NEEDED(WEIGHT GAIN 3 LBS IN A DAY OR 5 LBS IN A WEEK)  30 tablet  2  . insulin aspart (NOVOLOG) 100 UNIT/ML injection Inject 3-12 Units into the skin 4 (four) times daily -  before meals and at bedtime. Uses a sliding scale.      . insulin glargine (LANTUS) 100 UNIT/ML injection Inject 20 Units into the skin daily. Sliding scale per pt.      . isosorbide-hydrALAZINE (BIDIL) 20-37.5 MG per tablet Take 2 tablets by mouth 2 (two) times daily.  120 tablet  11  . nitroGLYCERIN (NITROSTAT) 0.4 MG SL tablet Place 1 tablet (0.4 mg total) under the tongue every 5 (five) minutes as needed for chest pain.  25 tablet  3  . oxymetazoline (AFRIN) 0.05 % nasal spray Place 1 spray into both nostrils daily as needed for congestion.      Marland Kitchen PROAIR HFA 108 (90 BASE) MCG/ACT inhaler       . sodium chloride (OCEAN) 0.65 % SOLN nasal spray Place 1 spray into both nostrils daily as needed for congestion.      Marland Kitchen amLODipine (NORVASC) 10 MG tablet Take 1 tablet (10 mg total) by mouth daily.  30 tablet  11   No current facility-administered medications for this visit.    Allergies:   Review of patient's allergies indicates no known allergies.   Social History:  The patient  reports that she has never smoked. She has never used smokeless tobacco. She reports that she does not drink alcohol or use illicit drugs.   Family History:  The patient's family history includes Diabetes in her father.   ROS:  Please see the history of present illness.    She denies fever.  She has neck pain as well.   All other systems reviewed and negative.   PHYSICAL EXAM: VS:  BP 196/89  Pulse 77  Ht 5\' 7"  (1.702 m)   Wt 205 lb 1.9 oz (93.042 kg)  BMI 32.12 kg/m2 Well nourished, well developed, in no acute distress HEENT: normal Neck: no JVD Cardiac:  normal S1, S2; RRR; no murmur Lungs:  clear to auscultation bilaterally, no wheezing, rhonchi or rales Abd: soft, nontender, no hepatomegaly Ext: tr edema in legs  Skin: warm and dry Neuro:  CNs 2-12 intact, no focal abnormalities noted  EKG:  NSR, HR 81, normal axis, lateral T wave inversions     ASSESSMENT AND PLAN:  1.  Chronic Combined Systolic and Diastolic CHF: Volume remains stable. Continue current regimen. 2. Nonischemic Cardiomyopathy: She had minimal plaque by cardiac catheterization. Continue beta blocker, hydralazine, nitrates. CKD has limited use of ACE-I. If her renal function stabilizes, consider resuming ACEI.  F/u echo in 5/15 showed no change.  EF 35%.  Cr was 1.6 at last check in 2/15.  i stressed the importance of staying on her BP meds. 3. Chronic Kidney Disease:  Cr 1.6.   4. Hypertensive Heart Disease: Much better controlled except for today. Continue current regimen. Moved amlodipine to the evening.  I am not sure that she is compliant with her meds.  She admits to stopping some meds at times due to perceived side effects.  She will try to take the BiDil at a different time since that seems to mitigate the confusion that occurs.  Will refer to neurology.  I do not think her confusion is cardiac related. 5. Hyperlipidemia: Continue statin. 6. Disposition: f/u in 3 months  Signed, Casandra Doffing, MD, [facility]  12/27/2013 11:00 AM

## 2013-12-27 NOTE — Patient Instructions (Signed)
Your physician recommends that you continue on your current medications as directed. Please refer to the Current Medication list given to you today.  You have been referred to [facility]. Check out will assist you with scheduling appointment.   Your physician recommends that you schedule a follow-up appointment in: 3 months with Dr. Irish Lack.

## 2014-01-02 ENCOUNTER — Ambulatory Visit: Payer: Self-pay | Admitting: Interventional Cardiology

## 2014-01-17 ENCOUNTER — Encounter: Payer: Self-pay | Admitting: Neurology

## 2014-01-17 ENCOUNTER — Ambulatory Visit (INDEPENDENT_AMBULATORY_CARE_PROVIDER_SITE_OTHER): Payer: BC Managed Care – PPO | Admitting: Neurology

## 2014-01-17 VITALS — BP 146/64 | HR 91 | Ht 67.0 in | Wt 205.0 lb

## 2014-01-17 DIAGNOSIS — R4182 Altered mental status, unspecified: Secondary | ICD-10-CM

## 2014-01-17 DIAGNOSIS — F09 Unspecified mental disorder due to known physiological condition: Secondary | ICD-10-CM

## 2014-01-17 DIAGNOSIS — R4189 Other symptoms and signs involving cognitive functions and awareness: Secondary | ICD-10-CM

## 2014-01-17 HISTORY — DX: Altered mental status, unspecified: R41.82

## 2014-01-17 NOTE — Progress Notes (Signed)
[facility]    Provider:  Dr Janann Colonel Referring Provider: Hulan Fess, MD Primary Care Physician:  Gennette Pac, MD  CC:  confusion  HPI:  Teresa Poole is a 63 y.o. female here as a referral from Dr. Rex Kras for evaluation of confusion  Reports symptoms began after taking a dose of her insulin, her dose was decreased but her symptoms persist. She states that she gets disoriented when she takes certain medications. She reports this is related to her coreg and bidil and her insulin. The doses have been adjusted but the symptoms persist. She notes that she takes the medication and then 20-30 minutes after she takes the medication. Describes a sensation of feeling like she is "drunk" after she takes her medication, feels disoriented and unsteady on her feet. States this sensation can linger for 1-2 hours after she takes her medication. Notes feeling unsafe to drive during these episodes. No trips or falls during these episodes, states she just sits still during these episodes. She reports that all these medications were started in January after being admitted to the hospital in January. Describes onset of symptoms shortly after being discharged. She reports that they decreased the dose of insulin but have not adjusted her other medications. There is question about her compliance.   Denies any difficulty with her short term memory but does note that recently she is misplacing things and forgetting what she is supposed to be doing. Reports she recently returned from a medical leave for confusion. She notes episodes of hallucinations at night, sees things in her apartment at night. Will have "fighths with 3D images" overnight. Notes that this has decreased over the past 1-2 weeks.   Review of Systems: Out of a complete 14 system review, the patient complains of only the following symptoms, and all other reviewed systems are negative. + confusion, agitation  History    Social History  . Marital Status: Single    Spouse Name: N/A    Number of Children: 0  . Years of Education: N/A   Occupational History  .  [employer]   Social History Main Topics  . Smoking status: Never Smoker   . Smokeless tobacco: Never Used  . Alcohol Use: No  . Drug Use: No  . Sexual Activity: Not on file   Other Topics Concern  . Not on file   Social History Narrative   Works at [employer]   Patient lives at home alone.    Patient has no children.    Patient patient is right handed.    Patient is single.     Family History  Problem Relation Age of Onset  . Diabetes Father     Past Medical History  Diagnosis Date  . Diabetes mellitus without complication   . Hypertension   . History of ovarian cancer   . Obesity (BMI 30-39.9)   . Chronic combined systolic and diastolic CHF (congestive heart failure)   . NICM (nonischemic cardiomyopathy)     a. Echocardiogram (09/08/2013): Mild LVH, EF 30-35%, no WMA, mild LAE.  Marland Kitchen CAD (coronary artery disease)     non-obstructive by LHC (09/25/2013): Proximal and mid LAD serial 20%, proximal circumflex 30%, mid AV groove circumflex 30%, mid RCA mild plaque.  . Carotid stenosis     a. Carotid US (09/26/2011): Bilateral 1-39% ICA.  Marland Kitchen CKD (chronic kidney disease)   . Hyperlipidemia     Past Surgical History  Procedure Laterality Date  . Abdominal  hysterectomy      Current Outpatient Prescriptions  Medication Sig Dispense Refill  . amLODipine (NORVASC) 10 MG tablet Take 1 tablet (10 mg total) by mouth daily.  30 tablet  11  . aspirin EC 81 MG EC tablet Take 1 tablet (81 mg total) by mouth daily.  90 tablet  0  . atorvastatin (LIPITOR) 20 MG tablet TAKE 1 TABLET BY MOUTH DAILY AT 6PM  30 tablet  2  . BD PEN NEEDLE NANO U/F 32G X 4 MM MISC       . carvedilol (COREG) 12.5 MG tablet Take 1 tablet (12.5 mg total) by mouth 2 (two) times daily with a meal.  60 tablet  11  . fluticasone (FLONASE) 50 MCG/ACT nasal spray Place 1  spray into both nostrils as needed.       . furosemide (LASIX) 20 MG tablet TAKE 1 TABLET BY MOUTH EVERY DAY AS NEEDED(WEIGHT GAIN 3 LBS IN A DAY OR 5 LBS IN A WEEK)  30 tablet  2  . insulin aspart (NOVOLOG) 100 UNIT/ML injection Inject 3-12 Units into the skin 4 (four) times daily -  before meals and at bedtime. Uses a sliding scale.      . insulin glargine (LANTUS) 100 UNIT/ML injection Inject 20 Units into the skin daily. Sliding scale per pt.      . isosorbide-hydrALAZINE (BIDIL) 20-37.5 MG per tablet Take 2 tablets by mouth 2 (two) times daily.  120 tablet  11  . nitroGLYCERIN (NITROSTAT) 0.4 MG SL tablet Place 1 tablet (0.4 mg total) under the tongue every 5 (five) minutes as needed for chest pain.  25 tablet  3  . ONE TOUCH ULTRA TEST test strip       . oxymetazoline (AFRIN) 0.05 % nasal spray Place 1 spray into both nostrils daily as needed for congestion.      Marland Kitchen PROAIR HFA 108 (90 BASE) MCG/ACT inhaler       . sodium chloride (OCEAN) 0.65 % SOLN nasal spray Place 1 spray into both nostrils daily as needed for congestion.       No current facility-administered medications for this visit.    Allergies as of 01/17/2014  . (No Known Allergies)    Vitals: BP 146/64  Pulse 91  Ht 5\' 7"  (1.702 m)  Wt 205 lb (92.987 kg)  BMI 32.10 kg/m2 Last Weight:  Wt Readings from Last 1 Encounters:  01/17/14 205 lb (92.987 kg)   Last Height:   Ht Readings from Last 1 Encounters:  01/17/14 5\' 7"  (1.702 m)     Physical exam: Exam: Gen: NAD, conversant Eyes: anicteric sclerae, moist conjunctivae, exophthalamos bilateral HENT: Atraumatic, oropharynx clear Neck: Trachea midline; supple,  Lungs: CTA, no wheezing, rales, rhonic                          CV: RRR, no MRG Abdomen: Soft, non-tender;  Extremities: No peripheral edema  Skin: Normal temperature, no rash,  Psych: Appropriate affect, pleasant  Neuro: MS:  MOCA 20/30  Pressured speech  CN: PERRL, EOMI no nystagmus, no ptosis,  sensation intact to LT V1-V3 bilat, face symmetric, no weakness, hearing grossly intact, palate elevates symmetrically, shoulder shrug 5/5 bilat,  tongue protrudes midline, no fasiculations noted.  Motor: normal bulk and tone Strength: 5/5  In all extremities  Coord: rapid alternating and point-to-point (FNF, HTS) movements intact.  Reflexes: symmetrical, bilat downgoing toes  Sens: LT intact in all extremities  Gait: posture, stance, stride and arm-swing normal. Tandem gait intact. Able to walk on heels and toes. Romberg absent.   Assessment:  After physical and neurologic examination, review of laboratory studies, imaging, neurophysiology testing and pre-existing records, assessment will be reviewed on the problem list.  Plan:  Treatment plan and additional workup will be reviewed under Problem List.  1)Confusion 2)Cognitive decline  63y/o woman presented for initial evaluation of confusion which she attributes to her medications (specifically coreg, bidil and her insulins). Exam notable for pressured speech, agitation, confusion and a MOCA of 20/30. Her medications may be playing a role in her symptoms but I suspect there is an underlying neurological or psychiatric cause of her symptoms. Will check blood work and EEG. Depending on results will consider referral to psychiatry. Will defer medication adjustments to PCP and cardiology.   Jim Like, DO  [facility] [address] [place], [place] [address]  Phone [phone] Fax [phone]

## 2014-01-17 NOTE — Patient Instructions (Signed)
Overall you are doing fairly well but I do want to suggest a few things today:   Remember to drink plenty of fluid, eat healthy meals and do not skip any meals. Try to eat protein with a every meal and eat a healthy snack such as fruit or nuts in between meals. Try to keep a regular sleep-wake schedule and try to exercise daily, particularly in the form of walking, 20-30 minutes a day, if you can.   As far as your medications are concerned, I would like to suggest you continue to work with your doctors to adjust your medications  As far as diagnostic testing:  1)Please have some blood work completed today 2)I would like you to have an EEG done, please schedule this today  Follow up as needed. Please call us with any interim questions, concerns, problems, updates or refill requests.   My clinical assistant and will answer any of your questions and relay your messages to me and also relay most of my messages to you.   Our phone number is [phone]. We also have an after hours call service for urgent matters and there is a physician on-call for urgent questions. For any emergencies you know to call 911 or go to the nearest emergency room

## 2014-01-19 ENCOUNTER — Ambulatory Visit: Payer: Self-pay

## 2014-01-19 LAB — METHYLMALONIC ACID, SERUM: Methylmalonic Acid: 376 nmol/L (ref 0–378)

## 2014-01-19 LAB — VITAMIN B12: VITAMIN B 12: 184 pg/mL — AB (ref 211–946)

## 2014-01-19 LAB — TSH: TSH: 1.45 u[IU]/mL (ref 0.450–4.500)

## 2014-01-19 NOTE — Progress Notes (Signed)
Quick Note:  Spoke with patient informed her of low vitamin B12, patient prefers to come here to have injections. ______

## 2014-01-19 NOTE — Progress Notes (Signed)
Quick Note:  Pt called and set up starting next week for B 12 injections. (cyanocobalamin 1028mcg IM daily for 5 days, then once weekly for 4 wks, then once monthly). Start on Monday, January 22, 2014 at 1200. Pt verbalized understanding. ______

## 2014-01-22 ENCOUNTER — Ambulatory Visit (INDEPENDENT_AMBULATORY_CARE_PROVIDER_SITE_OTHER): Payer: BC Managed Care – PPO | Admitting: *Deleted

## 2014-01-22 DIAGNOSIS — E538 Deficiency of other specified B group vitamins: Secondary | ICD-10-CM

## 2014-01-22 MED ORDER — CYANOCOBALAMIN 1000 MCG/ML IJ SOLN
1000.0000 ug | Freq: Once | INTRAMUSCULAR | Status: AC
  Administered 2014-01-22: 1000 ug via INTRAMUSCULAR

## 2014-01-22 NOTE — Progress Notes (Signed)
Pt here for B 12 injection.  Under aseptic technique cyanocobalamin 1000mcg/1ml IM given R deltoid.  Tolerated well.  Bandaid applied.  

## 2014-01-22 NOTE — Patient Instructions (Signed)
See tomorrow at 1145.

## 2014-01-23 ENCOUNTER — Ambulatory Visit (INDEPENDENT_AMBULATORY_CARE_PROVIDER_SITE_OTHER): Payer: BC Managed Care – PPO | Admitting: *Deleted

## 2014-01-23 DIAGNOSIS — E538 Deficiency of other specified B group vitamins: Secondary | ICD-10-CM

## 2014-01-23 MED ORDER — CYANOCOBALAMIN 1000 MCG/ML IJ SOLN
1000.0000 ug | Freq: Once | INTRAMUSCULAR | Status: AC
  Administered 2014-01-23: 1000 ug via INTRAMUSCULAR

## 2014-01-23 NOTE — Patient Instructions (Signed)
Will see tomorrow for #3.

## 2014-01-23 NOTE — Progress Notes (Signed)
Pt here for B 12 injection.  Under aseptic technique cyanocobalamin 1000mcg/1ml IM given L deltoid.  Tolerated well.  Bandaid applied.  

## 2014-01-24 ENCOUNTER — Ambulatory Visit (INDEPENDENT_AMBULATORY_CARE_PROVIDER_SITE_OTHER): Payer: BC Managed Care – PPO | Admitting: Neurology

## 2014-01-24 DIAGNOSIS — E538 Deficiency of other specified B group vitamins: Secondary | ICD-10-CM

## 2014-01-24 MED ORDER — CYANOCOBALAMIN 1000 MCG/ML IJ SOLN
1000.0000 ug | Freq: Once | INTRAMUSCULAR | Status: AC
  Administered 2014-01-24: 1000 ug via INTRAMUSCULAR

## 2014-01-24 NOTE — Progress Notes (Signed)
Patient here for B12 injection.  Under aseptic technique Cyanocobalamin 1021mcg/1ml given IM in right deltoid.  Tolerated well.  Band-Aid applied.

## 2014-01-24 NOTE — Patient Instructions (Signed)
Patient will return tomorrow for B12 injection.

## 2014-01-25 ENCOUNTER — Ambulatory Visit (INDEPENDENT_AMBULATORY_CARE_PROVIDER_SITE_OTHER): Payer: BC Managed Care – PPO | Admitting: Neurology

## 2014-01-25 DIAGNOSIS — E538 Deficiency of other specified B group vitamins: Secondary | ICD-10-CM

## 2014-01-25 MED ORDER — CYANOCOBALAMIN 1000 MCG/ML IJ SOLN
1000.0000 ug | Freq: Once | INTRAMUSCULAR | Status: AC
  Administered 2014-01-25: 1000 ug via INTRAMUSCULAR

## 2014-01-25 NOTE — Patient Instructions (Signed)
Patient will return tomorrow for day 5/5 of B12 injection.

## 2014-01-25 NOTE — Progress Notes (Signed)
Patient here for B12 injection, day 4.  Under aseptic technique Cyanocobalamin given IM in left deltoid.  Tolerated well.  Band-Aid applied.

## 2014-01-26 ENCOUNTER — Ambulatory Visit (INDEPENDENT_AMBULATORY_CARE_PROVIDER_SITE_OTHER): Payer: BC Managed Care – PPO | Admitting: Neurology

## 2014-01-26 DIAGNOSIS — E538 Deficiency of other specified B group vitamins: Secondary | ICD-10-CM

## 2014-01-26 MED ORDER — CYANOCOBALAMIN 1000 MCG/ML IJ SOLN
1000.0000 ug | Freq: Once | INTRAMUSCULAR | Status: AC
Start: 2014-01-26 — End: 2014-01-26
  Administered 2014-01-26: 1000 ug via INTRAMUSCULAR

## 2014-01-26 NOTE — Progress Notes (Signed)
Patient here for B12 injection day 5/5.  Under aseptic technique Cyanocobalamin given IM in left deltoid.  Tolerated well.  Band-Aid applied.

## 2014-01-26 NOTE — Patient Instructions (Signed)
Patient will return next Tuesday to start her once a week B12 injection.

## 2014-01-30 ENCOUNTER — Encounter (INDEPENDENT_AMBULATORY_CARE_PROVIDER_SITE_OTHER): Payer: Self-pay

## 2014-01-30 ENCOUNTER — Ambulatory Visit (INDEPENDENT_AMBULATORY_CARE_PROVIDER_SITE_OTHER): Payer: BC Managed Care – PPO | Admitting: *Deleted

## 2014-01-30 DIAGNOSIS — E538 Deficiency of other specified B group vitamins: Secondary | ICD-10-CM

## 2014-01-30 MED ORDER — CYANOCOBALAMIN 1000 MCG/ML IJ SOLN
1000.0000 ug | Freq: Once | INTRAMUSCULAR | Status: AC
  Administered 2014-01-30: 1000 ug via INTRAMUSCULAR

## 2014-01-30 NOTE — Patient Instructions (Signed)
See next week

## 2014-01-30 NOTE — Progress Notes (Signed)
Pt here for B 12 injection.  Under aseptic technique cyanocobalamin 1000mcg/1ml IM given R deltoid.  Tolerated well.  Bandaid applied.  

## 2014-02-01 ENCOUNTER — Encounter (INDEPENDENT_AMBULATORY_CARE_PROVIDER_SITE_OTHER): Payer: Self-pay

## 2014-02-01 ENCOUNTER — Ambulatory Visit
Admission: RE | Admit: 2014-02-01 | Discharge: 2014-02-01 | Disposition: A | Discharge status: Home / Self Care | Payer: BC Managed Care – PPO | Source: Ambulatory Visit | Attending: Family Medicine | Admitting: Family Medicine

## 2014-02-01 DIAGNOSIS — N6320 Unspecified lump in the left breast, unspecified quadrant: Secondary | ICD-10-CM

## 2014-02-06 ENCOUNTER — Ambulatory Visit (INDEPENDENT_AMBULATORY_CARE_PROVIDER_SITE_OTHER): Payer: BC Managed Care – PPO | Admitting: *Deleted

## 2014-02-06 DIAGNOSIS — E538 Deficiency of other specified B group vitamins: Secondary | ICD-10-CM

## 2014-02-06 MED ORDER — CYANOCOBALAMIN 1000 MCG/ML IJ SOLN
1000.0000 ug | Freq: Once | INTRAMUSCULAR | Status: AC
  Administered 2014-02-06: 1000 ug via INTRAMUSCULAR

## 2014-02-06 NOTE — Progress Notes (Signed)
Pt here for B 12 injection.  Under aseptic technique cyanocobalamin 1000mcg/1ml IM given L deltoid.  Tolerated well.  Bandaid applied.  

## 2014-02-06 NOTE — Patient Instructions (Signed)
See next week

## 2014-02-13 ENCOUNTER — Ambulatory Visit (INDEPENDENT_AMBULATORY_CARE_PROVIDER_SITE_OTHER): Payer: BC Managed Care – PPO | Admitting: *Deleted

## 2014-02-13 ENCOUNTER — Encounter (INDEPENDENT_AMBULATORY_CARE_PROVIDER_SITE_OTHER): Payer: Self-pay

## 2014-02-13 DIAGNOSIS — E538 Deficiency of other specified B group vitamins: Secondary | ICD-10-CM

## 2014-02-13 MED ORDER — CYANOCOBALAMIN 1000 MCG/ML IJ SOLN
1000.0000 ug | Freq: Once | INTRAMUSCULAR | Status: AC
  Administered 2014-02-13: 1000 ug via INTRAMUSCULAR

## 2014-02-13 NOTE — Progress Notes (Signed)
Pt here for B 12 injection.  Under aseptic technique cyanocobalamin 1000mcg/1ml IM given R deltoid.  Tolerated well.  Bandaid applied.  

## 2014-02-13 NOTE — Patient Instructions (Signed)
See next week for # 4 weekly, then will go to monthly.

## 2014-02-20 ENCOUNTER — Ambulatory Visit (INDEPENDENT_AMBULATORY_CARE_PROVIDER_SITE_OTHER): Payer: BC Managed Care – PPO | Admitting: *Deleted

## 2014-02-20 DIAGNOSIS — E538 Deficiency of other specified B group vitamins: Secondary | ICD-10-CM

## 2014-02-20 MED ORDER — CYANOCOBALAMIN 1000 MCG/ML IJ SOLN
1000.0000 ug | INTRAMUSCULAR | Status: AC
  Administered 2014-02-20 – 2014-04-24 (×3): 1000 ug via INTRAMUSCULAR

## 2014-02-20 NOTE — Progress Notes (Signed)
Pt here for B 12 injection.  Under aseptic technique cyanocobalamin 1000mcg/1ml IM given L deltoid.  Tolerated well.  Bandaid applied.  

## 2014-02-20 NOTE — Patient Instructions (Signed)
See in one month.   

## 2014-03-22 ENCOUNTER — Ambulatory Visit (INDEPENDENT_AMBULATORY_CARE_PROVIDER_SITE_OTHER): Payer: BC Managed Care – PPO | Admitting: *Deleted

## 2014-03-22 DIAGNOSIS — E538 Deficiency of other specified B group vitamins: Secondary | ICD-10-CM

## 2014-03-22 NOTE — Patient Instructions (Signed)
Patient will come back in a month for next injection.

## 2014-03-22 NOTE — Progress Notes (Signed)
Patient here for B 12 injection, injected 1000 mcg IM, band-aid was applied and patient tolerated well.

## 2014-03-30 ENCOUNTER — Ambulatory Visit: Payer: BC Managed Care – PPO | Admitting: Interventional Cardiology

## 2014-04-20 ENCOUNTER — Ambulatory Visit: Payer: BC Managed Care – PPO

## 2014-04-24 ENCOUNTER — Ambulatory Visit (INDEPENDENT_AMBULATORY_CARE_PROVIDER_SITE_OTHER): Payer: BC Managed Care – PPO | Admitting: *Deleted

## 2014-04-24 DIAGNOSIS — E538 Deficiency of other specified B group vitamins: Secondary | ICD-10-CM

## 2014-04-24 NOTE — Patient Instructions (Signed)
Patient will return in one month for next injection. 

## 2014-04-24 NOTE — Progress Notes (Signed)
Patient here for B 12 injection, cyanocobalamin 1000 mcg/mL injected IM into the Left deltoid, band-aid was applied and patient tolerated well.

## 2014-05-23 ENCOUNTER — Telehealth: Payer: Self-pay | Admitting: *Deleted

## 2014-05-23 NOTE — Telephone Encounter (Signed)
Patient will hold off on b-12 injections until after a new provider has been established here. She wants to have lab work to see if injections are beneficial to her.

## 2014-05-24 ENCOUNTER — Ambulatory Visit: Payer: BC Managed Care – PPO

## 2014-08-02 ENCOUNTER — Encounter (HOSPITAL_COMMUNITY): Payer: Self-pay | Admitting: Cardiovascular Disease

## 2014-10-02 ENCOUNTER — Other Ambulatory Visit: Payer: Self-pay | Admitting: Family Medicine

## 2014-10-02 DIAGNOSIS — I6523 Occlusion and stenosis of bilateral carotid arteries: Secondary | ICD-10-CM

## 2014-10-05 ENCOUNTER — Ambulatory Visit
Admission: RE | Admit: 2014-10-05 | Discharge: 2014-10-05 | Disposition: A | Discharge status: Home / Self Care | Payer: BC Managed Care – PPO | Source: Ambulatory Visit | Attending: Family Medicine | Admitting: Family Medicine

## 2014-10-05 ENCOUNTER — Ambulatory Visit (INDEPENDENT_AMBULATORY_CARE_PROVIDER_SITE_OTHER): Payer: BC Managed Care – PPO | Admitting: Endocrinology

## 2014-10-05 ENCOUNTER — Encounter: Payer: Self-pay | Admitting: Endocrinology

## 2014-10-05 VITALS — BP 158/82 | HR 87 | Temp 97.9°F | Resp 16 | Ht 67.0 in | Wt 224.0 lb

## 2014-10-05 DIAGNOSIS — E1165 Type 2 diabetes mellitus with hyperglycemia: Secondary | ICD-10-CM

## 2014-10-05 DIAGNOSIS — E538 Deficiency of other specified B group vitamins: Secondary | ICD-10-CM

## 2014-10-05 DIAGNOSIS — IMO0002 Reserved for concepts with insufficient information to code with codable children: Secondary | ICD-10-CM

## 2014-10-05 DIAGNOSIS — E785 Hyperlipidemia, unspecified: Secondary | ICD-10-CM

## 2014-10-05 DIAGNOSIS — I6523 Occlusion and stenosis of bilateral carotid arteries: Secondary | ICD-10-CM

## 2014-10-05 DIAGNOSIS — I10 Essential (primary) hypertension: Secondary | ICD-10-CM

## 2014-10-05 DIAGNOSIS — R609 Edema, unspecified: Secondary | ICD-10-CM

## 2014-10-05 MED ORDER — DULAGLUTIDE 0.75 MG/0.5ML ~~LOC~~ SOAJ: 0.7500 mg | SUBCUTANEOUS | Status: DC

## 2014-10-05 NOTE — Progress Notes (Signed)
Patient ID: Teresa Poole, female   DOB: 29-Sep-1950, 64 y.o.   MRN: [id number]           Reason for Appointment: Consultation for Type 2 Diabetes  Referring physician: Fulp  History of Present Illness:          Diagnosis: Type 2 diabetes mellitus, date of diagnosis: 2000        Past history: At diagnosis she was having fatigue and she was initially treated with metformin Metformin was stopped about 1-1/2 years ago when she decided not to take any medications. She was in the hospital in 08/2013 and her blood sugar was about 460 with A1c of 15. She was started on Lantus and NovoLog and has been continued on this regimen since then. Has not been back on any oral hypoglycemic drugs in the past year  Recent history:  She continues to take Lantus once a day at the same dose for the last several months. For some reason she is taking her NovoLog about an hour after eating and only based on her blood sugar with the dose ranging from 5-15 units. She has difficulty complying with her and mealtime insulin especially when she is at work and may take it at irregular times. Also has been gaining weight in the last year Current blood sugar patterns and problems identified:  Fasting blood sugars are excellent without much variability  She will periodically have low blood sugars late morning and occasionally after supper or at bedtime with lowest reading 42 at 10 AM  Blood sugars are overall the highest after lunch which is her main meal.  Blood sugars around supper time are generally fairly good  Does not check often at bedtime and most of the readings are near normal        Oral hypoglycemic drugs the patient is taking are: None      Side effects from medications have been: INSULIN regimen is described as: Lantus 20, Novolog 1 hr pc 5-15 units  Compliance with the medical regimen: Fair Hypoglycemia: As above has 4 readings below 65 in the last 2 weeks    Glucose monitoring:  done up to 3  times a day, not consistently         Glucometer: One Touch.      Blood Glucose readings by time of day and averages from meter download:  PREMEAL Breakfast Lunch Dinner Bedtime  Overall   Glucose range:  84-116   43-128  72-176   48-140    Median:     113   POST-MEAL PC Breakfast PC Lunch PC Dinner  Glucose range:   87-251    Median:      Self-care:     Meals: 3 meals per day. Breakfast is meat, egg, potatoes and sometimes oatmeal/fruit.  Does not always avoid fried food and is asking about planning meals with balanced constituents           Exercise: On feet at work, recumbent bike at times         Dietician visit, most recent: never.               Weight history: 205-245  Wt Readings from Last 3 Encounters:  10/05/14 224 lb (101.606 kg)  01/17/14 205 lb (92.987 kg)  12/27/13 205 lb 1.9 oz (93.042 kg)    Glycemic control:   Recent A1c on 09/28/14 is 6.4  Lab Results  Component Value Date   HGBA1C 15.0* 09/07/2013   Lab  Results  Component Value Date   CREATININE 1.6* 10/13/2013   Microalbumin creatinine ratio on 09/28/14 = 3730 and creatinine clearance 46 with creatinine level I.4      Medication List       This list is accurate as of: 10/05/14  9:32 AM.  Always use your most recent med list.               amLODipine 10 MG tablet  Commonly known as:  NORVASC  Take 1 tablet (10 mg total) by mouth daily.     aspirin 81 MG EC tablet  Take 1 tablet (81 mg total) by mouth daily.     atorvastatin 20 MG tablet  Commonly known as:  LIPITOR  TAKE 1 TABLET BY MOUTH DAILY AT 6PM     BD PEN NEEDLE NANO U/F 32G X 4 MM Misc  Generic drug:  Insulin Pen Needle     carvedilol 12.5 MG tablet  Commonly known as:  COREG  Take 1 tablet (12.5 mg total) by mouth 2 (two) times daily with a meal.     fluticasone 50 MCG/ACT nasal spray  Commonly known as:  FLONASE  Place 1 spray into both nostrils as needed.     furosemide 20 MG tablet  Commonly known as:  LASIX  TAKE 1  TABLET BY MOUTH EVERY DAY AS NEEDED(WEIGHT GAIN 3 LBS IN A DAY OR 5 LBS IN A WEEK)     insulin aspart 100 UNIT/ML injection  Commonly known as:  novoLOG  Inject 3-12 Units into the skin 4 (four) times daily -  before meals and at bedtime. Uses a sliding scale.     insulin glargine 100 UNIT/ML injection  Commonly known as:  LANTUS  Inject 20 Units into the skin daily. Sliding scale per pt.     isosorbide-hydrALAZINE 20-37.5 MG per tablet  Commonly known as:  BIDIL  Take 2 tablets by mouth 2 (two) times daily.     nitroGLYCERIN 0.4 MG SL tablet  Commonly known as:  NITROSTAT  Place 1 tablet (0.4 mg total) under the tongue every 5 (five) minutes as needed for chest pain.     ONE TOUCH ULTRA TEST test strip  Generic drug:  glucose blood     oxymetazoline 0.05 % nasal spray  Commonly known as:  AFRIN  Place 1 spray into both nostrils daily as needed for congestion.     PROAIR HFA 108 (90 BASE) MCG/ACT inhaler  Generic drug:  albuterol     sodium chloride 0.65 % Soln nasal spray  Commonly known as:  OCEAN  Place 1 spray into both nostrils daily as needed for congestion.        Allergies: No Known Allergies  Past Medical History  Diagnosis Date  . Diabetes mellitus without complication   . Hypertension   . History of ovarian cancer   . Obesity (BMI 30-39.9)   . Chronic combined systolic and diastolic CHF (congestive heart failure)   . NICM (nonischemic cardiomyopathy)     a. Echocardiogram (09/08/2013): Mild LVH, EF 30-35%, no WMA, mild LAE.  Marland Kitchen CAD (coronary artery disease)     non-obstructive by LHC (09/25/2013): Proximal and mid LAD serial 20%, proximal circumflex 30%, mid AV groove circumflex 30%, mid RCA mild plaque.  . Carotid stenosis     a. Carotid US (09/26/2011): Bilateral 1-39% ICA.  Marland Kitchen CKD (chronic kidney disease)   . Hyperlipidemia     Past Surgical History  Procedure Laterality Date  .  Abdominal hysterectomy    . Left heart catheterization with coronary  angiogram N/A 09/25/2013    Procedure: LEFT HEART CATHETERIZATION WITH CORONARY ANGIOGRAM;  Surgeon: Burnell Blanks, MD;  Location: [facility] CATH LAB;  Service: Cardiovascular;  Laterality: N/A;    Family History  Problem Relation Age of Onset  . Diabetes Father     Social History:  reports that she has never smoked. She has never used smokeless tobacco. She reports that she does not drink alcohol or use illicit drugs.    Review of Systems       Vision is normal. Most recent eye exam was        Lipids: Not tested recently, has been treated with Lipitor by PCP LDL in 11/2012 was 89      No results found for: CHOL, HDL, LDLCALC, LDLDIRECT, TRIG, CHOLHDL                Skin: No rash or infections     Thyroid:  No  unusual fatigue.No history of thyroid disease, TSH normal in 12/2013  Has mild anemia with hemoglobin 11.8 in 09/2014     The blood pressure has been Controlled with carvedilol.  Is also on hydralazine/isosorbide for history of CHF     No swelling of feet  Recently.     No shortness of breath or chest tightness  on exertion.     Bowel habits: Normal.      Knee joint  pains.          No history of Numbness, tingling or burning in feet       Low B12 Was found when she was evaluated by a neurologist in 5/15 for confusion.  Previously was on B-12 shots but has not taken any for a while   LABS:  No visits with results within 1 Week(s) from this visit. Latest known visit with results is:  Office Visit on 01/17/2014  Component Date Value Ref Range Status  . TSH 01/17/2014 1.450  0.450 - 4.500 uIU/mL Final  . Vitamin B-12 01/17/2014 184* 211 - 946 pg/mL Final  . Methylmalonic Acid 01/17/2014 376  0 - 378 nmol/L Final    Physical Examination:  BP 158/82 mmHg  Pulse 87  Temp(Src) 97.9 F (36.6 C)  Resp 16  Ht 5\' 7"  (1.702 m)  Wt 224 lb (101.606 kg)  BMI 35.08 kg/m2  SpO2 97%  GENERAL:         Patient has generalized obesity.  She is mildly anxious, alert  and cooperative    HEENT:         Eye exam shows normal external appearance. Fundus exam shows no retinopathy. Oral exam shows normal mucosa .  NECK:         General:  Neck exam shows no lymphadenopathy. Carotids are normal to palpation and no bruit heard.  Thyroid is not enlarged and no nodules felt.   LUNGS:         Chest is symmetrical. Lungs are clear to auscultation.Marland Kitchen   HEART:         Heart sounds:  S1 and S2 are normal. No murmurs or clicks heard., no S3 or S4.   ABDOMEN:   There is no distention present. Liver and spleen are not palpable. No other mass or tenderness present.   NEUROLOGICAL:   Vibration sense is  reduced in toes. Ankle jerks are absent bilaterally.      Diabetic foot exam shows normal monofilament sensation in the  toes and plantar surfaces, no skin lesions or ulcers on the feet and normal pedal pulses     MUSCULOSKELETAL:       There is no enlargement or deformity of the joints. Spine is normal to inspection.Marland Kitchen   SKIN:       No rash or lesions of concern.       EXTREMITIES:     There is no edema. No skin lesions present..  ASSESSMENT:  Diabetes type 2, uncontrolled with obesity and BMI of 35 Although she has had diabetes for a few years she had marked hyperglycemia in 2015 when she was not on any medications due to noncompliance. However currently is occurring only relatively small doses of insulin with overall good control She has very poor understanding of diabetes management especially insulin and has not had any proper instructions Also has minimal knowledge of meal planning. Has gained weight on insulin and has overall difficulty losing weight    Since she is requiring only small doses of insulin she does probably have some endogenous insulin secretion As a good candidate for adding a GLP-1 drug to help with blood sugar control especially postprandial and also help lose weight May also be able to resume metformin since her creatinine clearance is 46  recently  Complications: No significant neuropathy or known retinopathy.  Does have significant proteinuria likely indicating diabetic nephropathy  She has multiple other medical problems including history of hypercholesterolemia, hypertensive cardiomyopathy, history of ovarian cancer, mild renal insufficiency and asthma  History of vitamin B-12 deficiency: Although she does not have any clinical symptoms at this time would need to follow-up with another level especially if planning to restart metformin   PLAN:  Start Trulicity Discussed with the patient the nature of GLP-1 drugs, the action on various organ systems, how they benefit blood glucose control, as well as the benefit of weight loss and  increase satiety . Explained possible side effects especially nausea and vomiting; discussed safety information in package insert. Demonstrated the medication injection device and injection technique to the patient. Discussed injection sites and titration of Trulicity starting with 0.75 mg once a week for 2 weeks and then increasing to 1.5 mg if no symptoms of nausea. Patient brochure on Trulicity and co-pay card given  Lantus 18 units daily and may need to reduce the dose further if fasting blood sugars  get lower  She needs to check blood sugars at least half the time about 2 hours after any meal and 3 times per week on waking up and bring blood sugar monitor to each visit. Recommended blood sugar levels about 2 hours after meal is 140-180 and on waking up 90-13 0  For now she can hold off on taking NovoLog except at lunch but she can try to see if after a few days her postprandial readings are still controlled with starting Trulicity.  Discussed taking NovoLog before meals instead of after  Call if sugars go over 200, may need 4 units Novolog at lunch if  Afternoon sugar > 200  Consider restarting metformin if blood sugars not consistently controlled  Consultation with the dietitian  She will  need follow-up lipids She'll continue to follow-up with PCP or cardiologist for hypertension which is not controlled today  Counseling time over 50% of today's 60 minute visit  [place] 10/05/2014, 9:32 AM   Note: This office note was prepared with Dragon voice recognition system technology. Any transcriptional errors that result from this process are unintentional.

## 2014-10-05 NOTE — Patient Instructions (Signed)
Start TRULICITYwith the pen as shown once weekly on the same day of the week.  You may inject in the stomach, thigh or arm as indicated in the brochure given.  You will feel fullness of the stomach with starting the medication and should try to keep the portions at meals small.  You may experience nausea in the first few days which usually gets better over time   If any questions or concerns are present call the office or the  [facility] at [phone]. Also visit [email or web address] website for more useful information  Lantus 18 units daily   Please check blood sugars at least half the time about 2 hours after any meal and 3 times per week on waking up. Please bring blood sugar monitor to each visit. Recommended blood sugar levels about 2 hours after meal is 140-180 and on waking up 90-130  Call if sugars go over 200, may need 4 units Novolog at lunch if  Afternoon sugar > 200

## 2014-10-12 ENCOUNTER — Other Ambulatory Visit: Payer: Self-pay

## 2014-10-12 DIAGNOSIS — Z1231 Encounter for screening mammogram for malignant neoplasm of breast: Secondary | ICD-10-CM

## 2014-10-16 ENCOUNTER — Other Ambulatory Visit: Payer: Self-pay | Admitting: Family Medicine

## 2014-10-16 DIAGNOSIS — E041 Nontoxic single thyroid nodule: Secondary | ICD-10-CM

## 2014-10-18 ENCOUNTER — Ambulatory Visit
Admission: RE | Admit: 2014-10-18 | Discharge: 2014-10-18 | Disposition: A | Discharge status: Home / Self Care | Payer: BC Managed Care – PPO | Source: Ambulatory Visit | Attending: Family Medicine | Admitting: Family Medicine

## 2014-10-18 DIAGNOSIS — E041 Nontoxic single thyroid nodule: Secondary | ICD-10-CM

## 2014-10-26 ENCOUNTER — Ambulatory Visit: Payer: BC Managed Care – PPO | Admitting: Interventional Cardiology

## 2014-11-07 ENCOUNTER — Other Ambulatory Visit: Payer: Self-pay | Admitting: Otolaryngology

## 2014-11-07 DIAGNOSIS — E041 Nontoxic single thyroid nodule: Secondary | ICD-10-CM

## 2014-11-08 ENCOUNTER — Other Ambulatory Visit: Payer: Self-pay

## 2014-11-08 ENCOUNTER — Encounter: Payer: BC Managed Care – PPO | Attending: Endocrinology | Admitting: Dietician

## 2014-11-08 ENCOUNTER — Ambulatory Visit
Admission: RE | Admit: 2014-11-08 | Discharge: 2014-11-08 | Disposition: A | Discharge status: Home / Self Care | Payer: BC Managed Care – PPO | Source: Ambulatory Visit

## 2014-11-08 ENCOUNTER — Encounter (INDEPENDENT_AMBULATORY_CARE_PROVIDER_SITE_OTHER): Payer: Self-pay

## 2014-11-08 ENCOUNTER — Encounter: Payer: Self-pay | Admitting: Endocrinology

## 2014-11-08 ENCOUNTER — Ambulatory Visit (INDEPENDENT_AMBULATORY_CARE_PROVIDER_SITE_OTHER): Payer: BC Managed Care – PPO | Admitting: Endocrinology

## 2014-11-08 VITALS — Ht 67.0 in | Wt 217.0 lb

## 2014-11-08 VITALS — BP 144/76 | HR 88 | Temp 99.0°F | Ht 67.0 in | Wt 217.0 lb

## 2014-11-08 DIAGNOSIS — E538 Deficiency of other specified B group vitamins: Secondary | ICD-10-CM

## 2014-11-08 DIAGNOSIS — IMO0002 Reserved for concepts with insufficient information to code with codable children: Secondary | ICD-10-CM

## 2014-11-08 DIAGNOSIS — E041 Nontoxic single thyroid nodule: Secondary | ICD-10-CM

## 2014-11-08 DIAGNOSIS — Z713 Dietary counseling and surveillance: Secondary | ICD-10-CM | POA: Diagnosis not present

## 2014-11-08 DIAGNOSIS — E1165 Type 2 diabetes mellitus with hyperglycemia: Secondary | ICD-10-CM

## 2014-11-08 DIAGNOSIS — Z794 Long term (current) use of insulin: Secondary | ICD-10-CM | POA: Insufficient documentation

## 2014-11-08 DIAGNOSIS — Z1231 Encounter for screening mammogram for malignant neoplasm of breast: Secondary | ICD-10-CM

## 2014-11-08 LAB — BASIC METABOLIC PANEL
BUN: 29 mg/dL — ABNORMAL HIGH (ref 6–23)
CALCIUM: 9.2 mg/dL (ref 8.4–10.5)
CO2: 29 meq/L (ref 19–32)
CREATININE: 1.55 mg/dL — AB (ref 0.40–1.20)
Chloride: 107 mEq/L (ref 96–112)
GFR: 43.45 mL/min — ABNORMAL LOW (ref >60.00)
Glucose, Bld: 139 mg/dL — ABNORMAL HIGH (ref 70–99)
POTASSIUM: 3.8 meq/L (ref 3.5–5.1)
Sodium: 141 mEq/L (ref 135–145)

## 2014-11-08 LAB — VITAMIN B12: VITAMIN B 12: 377 pg/mL (ref 211–911)

## 2014-11-08 MED ORDER — INSULIN GLARGINE 100 UNIT/ML ~~LOC~~ SOLN: 20.0000 [IU] | Freq: Every day | SUBCUTANEOUS | Status: DC

## 2014-11-08 NOTE — Progress Notes (Signed)
Patient ID: Teresa Poole, female   DOB: 1951-03-01, 64 y.o.   MRN: [id number]           Reason for Appointment: Follow-up for Type 2 Diabetes  Referring physician: Fulp  History of Present Illness:          Diagnosis: Type 2 diabetes mellitus, date of diagnosis: 2000        Past history: At diagnosis she was having fatigue and she was initially treated with metformin Metformin was stopped about 1-1/2 years ago when she decided not to take any medications. She was in the hospital in 08/2013 and her blood sugar was about 460 with A1c of 15. She was started on Lantus and NovoLog and has been continued on this regimen since then. Has not been back on any oral hypoglycemic drugs in the past year  Recent history:  On her initial consultation she was started on Trulicity to help with her postprandial readings and also told start NovoLog for coverage at her main meal at lunch Also she was having difficulty with hypoglycemia due to taking NovoLog postprandially with all meals She has lost some weight for Trulicity. She thinks she is usually eating small portions but had a rather large breakfast today.  Current blood sugar patterns and problems identified:  Fasting blood sugars are excellent without much variability  She has not checked readings after meals as directed and only did a few readings last month with some high readings at 6 PM or 8 PM  Her blood sugar is relatively high today in the office nonfasting  No hypoglycemia since her last visit       Oral hypoglycemic drugs the patient is taking are: None      Side effects from medications have been: None INSULIN regimen is described as: Lantus 14 hs, Novolog none Compliance with the medical regimen: Fair   Glucose monitoring:  done up to 3 times a day, not consistently         Glucometer: One Touch.      Blood Glucose readings by time of day and averages from meter download:  PRE-MEAL Breakfast  2 PM  Dinner  PCS  Overall    Glucose range:  91-136   98   94-192   99-225    Mean:     113    Self-care:     Meals: 3 meals per day. Breakfast is meat, egg, potatoes and sometimes oatmeal/fruit.  Does not always avoid fried food and is asking about planning meals with balanced constituents           Exercise: walking at work, recumbent bike at times         Dietician visit, most recent: never.               Weight history: 205-245  Wt Readings from Last 3 Encounters:  11/08/14 217 lb (98.431 kg)  10/05/14 224 lb (101.606 kg)  01/17/14 205 lb (92.987 kg)    Glycemic control:   Recent A1c on 09/28/14 is 6.4  Lab Results  Component Value Date   HGBA1C 15.0* 09/07/2013   Lab Results  Component Value Date   CREATININE 1.6* 10/13/2013   Microalbumin creatinine ratio on 09/28/14 = 3730 and creatinine clearance 46 with creatinine level I.4      Medication List       This list is accurate as of: 11/08/14  1:59 PM.  Always use your most recent med list.  amLODipine 10 MG tablet  Commonly known as:  NORVASC  Take 1 tablet (10 mg total) by mouth daily.     aspirin 81 MG EC tablet  Take 1 tablet (81 mg total) by mouth daily.     atorvastatin 20 MG tablet  Commonly known as:  LIPITOR  TAKE 1 TABLET BY MOUTH DAILY AT 6PM     BD PEN NEEDLE NANO U/F 32G X 4 MM Misc  Generic drug:  Insulin Pen Needle     carvedilol 12.5 MG tablet  Commonly known as:  COREG  Take 1 tablet (12.5 mg total) by mouth 2 (two) times daily with a meal.     Dulaglutide 0.75 MG/0.5ML Sopn  Commonly known as:  TRULICITY  Inject [id number] mg into the skin once a week.     fluticasone 50 MCG/ACT nasal spray  Commonly known as:  FLONASE  Place 1 spray into both nostrils as needed.     furosemide 20 MG tablet  Commonly known as:  LASIX  TAKE 1 TABLET BY MOUTH EVERY DAY AS NEEDED(WEIGHT GAIN 3 LBS IN A DAY OR 5 LBS IN A WEEK)     insulin aspart 100 UNIT/ML injection  Commonly known as:  novoLOG  Inject 3-12 Units  into the skin 4 (four) times daily -  before meals and at bedtime. Uses a sliding scale.     insulin glargine 100 UNIT/ML injection  Commonly known as:  LANTUS  Inject 20 Units into the skin daily. Sliding scale per pt.     isosorbide-hydrALAZINE 20-37.5 MG per tablet  Commonly known as:  BIDIL  Take 2 tablets by mouth 2 (two) times daily.     nitroGLYCERIN 0.4 MG SL tablet  Commonly known as:  NITROSTAT  Place 1 tablet (0.4 mg total) under the tongue every 5 (five) minutes as needed for chest pain.     ONE TOUCH ULTRA TEST test strip  Generic drug:  glucose blood     oxymetazoline 0.05 % nasal spray  Commonly known as:  AFRIN  Place 1 spray into both nostrils daily as needed for congestion.     PROAIR HFA 108 (90 BASE) MCG/ACT inhaler  Generic drug:  albuterol     simvastatin 40 MG tablet  Commonly known as:  ZOCOR     sodium chloride 0.65 % Soln nasal spray  Commonly known as:  OCEAN  Place 1 spray into both nostrils daily as needed for congestion.        Allergies: No Known Allergies  Past Medical History  Diagnosis Date  . Diabetes mellitus without complication   . Hypertension   . History of ovarian cancer   . Obesity (BMI 30-39.9)   . Chronic combined systolic and diastolic CHF (congestive heart failure)   . NICM (nonischemic cardiomyopathy)     a. Echocardiogram (09/08/2013): Mild LVH, EF 30-35%, no WMA, mild LAE.  Marland Kitchen CAD (coronary artery disease)     non-obstructive by LHC (09/25/2013): Proximal and mid LAD serial 20%, proximal circumflex 30%, mid AV groove circumflex 30%, mid RCA mild plaque.  . Carotid stenosis     a. Carotid US (09/26/2011): Bilateral 1-39% ICA.  Marland Kitchen CKD (chronic kidney disease)   . Hyperlipidemia     Past Surgical History  Procedure Laterality Date  . Abdominal hysterectomy    . Left heart catheterization with coronary angiogram N/A 09/25/2013    Procedure: LEFT HEART CATHETERIZATION WITH CORONARY ANGIOGRAM;  Surgeon: Burnell Blanks,  MD;  Location:  [facility];  Service: Cardiovascular;  Laterality: N/A;    Family History  Problem Relation Age of Onset  . Diabetes Father     Social History:  reports that she has never smoked. She has never used smokeless tobacco. She reports that she does not drink alcohol or use illicit drugs.    Review of Systems    Her PCP apparently found her thyroid nodule incidentally when she was having a carotid Doppler. She has a 2.8 cm mixed echogenic predominantly solid nodule on the right side and she is going to get a biopsy on this.  She wanted another opinion on this       Lipids: Not tested recently, has been treated with Lipitor by PCP LDL in 11/2012 was 89      No results found for: CHOL, HDL, LDLCALC, LDLDIRECT, TRIG, CHOLHDL                 The blood pressure has been Controlled with carvedilol.  Is also on hydralazine/isosorbide for history of CHF      Low B12 Was found when she was evaluated by a neurologist in 5/15 for confusion.  Previously was on B-12 shots but has not taken any for a while   LABS:  No visits with results within 1 Week(s) from this visit. Latest known visit with results is:  Office Visit on 01/17/2014  Component Date Value Ref Range Status  . TSH 01/17/2014 1.450  0.450 - 4.500 uIU/mL Final  . Vitamin B-12 01/17/2014 184* 211 - 946 pg/mL Final  . Methylmalonic Acid 01/17/2014 376  0 - 378 nmol/L Final    Physical Examination:  BP 144/76 mmHg  Pulse 88  Temp(Src) 99 F (37.2 C) (Oral)  Ht 5\' 7"  (1.702 m)  Wt 217 lb (98.431 kg)  BMI 33.98 kg/m2  SpO2 96%  No clear thyroid nodules felt on thyroid exam, has only a fullness in the right side when she is swallowing  ASSESSMENT:  Diabetes type 2, uncontrolled with obesity and BMI of 35 Her blood sugars are somewhat better with adding Trulicity to her insulin regimen However she does still seem to need some coverage for her relatively large meals This is difficult to assess since she is  not checking her sugars after meals lately She does not think she has large portions but did have a very light breakfast this morning with relatively high fat content She does appear to be needing significantly more diabetes education  THYROID nodule: Agree with the plan to do a needle aspiration for her dominant nodule, reassured her that chances of malignancy or less than 5%  PLAN:   Discussed needing to check readings after meals more consistently and less often in the morning Discussed blood sugar targets She will confirm that her postprandial readings are going up at least over 180 and start taking NovoLog 5-6 units Consider increasing dose of Trulicity to 1.5 mg  Encouraged her to be regular with her vitamin B-12 supplements  Patient Instructions  Please check blood sugars at least half the time about 2 hours after any meal and 3 times per week on waking up. Please bring blood sugar monitor to each visit. Recommended blood sugar levels about 2 hours after meal is 140-180 and on waking up 90-130  May need Novolog 5-6 units with main meal of the day   Counseling time over 50% of today's 25 minute visit   Teresa Poole 11/08/2014, 1:59 PM   Note: This office  note was prepared with Estate agent. Any transcriptional errors that result from this process are unintentional.

## 2014-11-08 NOTE — Patient Instructions (Signed)
Recommend a low sodium foods. Recommend MVI with iron. Get Vitamin B12 rechecked and treat this.   Aim for 3 Carbohydrate choices (45 grams) per meal Try to have consistency with meal timing. Try increasing your fiber to help have more regular bowel movements.

## 2014-11-08 NOTE — Progress Notes (Signed)
  Medical Nutrition Therapy:  Appt start time: [id number] end time:  [id number].   Assessment:  Primary concerns today: Patient would like to know how to reduce fat intake, control DM better and lose weight. Hx includes type 2 DM since 2000, CHF, thyroid nodules, HTN, nonischemic cardiomyopathy), hx of ovarian cancer, CKD, and hyperlipidemia. Patient states that she has sleep apnea but it has not been diagnosed but refuses to wear c-pap it it were diagnosed.    Patient states that she has no sense of taste or smell and has not appetite.  Patient has a "high stress, high demand" technology job.  Eats from stress.  Expressed her dissatisfaction with medical system.  Stated that she did not plan on returning to the hospital and if she was sick she would just stay there until she died.  Patient seemed very unhappy.  Poor eye contact through most of evaluation but improved with further education.  Refused crisis line card.  Noted that patient has been non compliant with Metformin in the past but states that she thinks that the symptoms that she had were due to not being educated to take it at the right time.  States that she hates needles and did not want to take several insulin shots per day or take blood sugar.  Preferred Learning Style:   No preference indicated   Learning Readiness:   Contemplating  MEDICATIONS: see list.  Patient is now starting on Trulicity to decrease the number of insulin shots required.   DIETARY INTAKE:  24-hr recall:  B ( AM): Just got a nutribullet   Snk ( AM): L ( PM): salad with chickn or chicken sandwich or broiled chicken or fish with  Snk ( PM):  D ( PM): salad with chicken and fruit Snk ( PM): Beverages: water, unsweetened tea,   Usual physical activity: none.  Problems with knees.    Estimated energy needs: 1400 calories 158 g carbohydrates 88 g protein 47 g fat  Progress Towards Goal(s):  In progress.   Nutritional Diagnosis:  NB-1.1 Food and  nutrition-related knowledge deficit As related to balance of carbohydrate protein, and fat.  As evidenced by diet hx and paitnet repeort..    Intervention:  Nutrition counseling and diabetes education initiated. Discussed Carb Counting by food group as method of portion control, reading food labels, and benefits of increased activity. Also discussed basic physiology of Diabetes, target BG ranges pre and post meals, and A1c.  Instructed in the 15 minute rule for treatment of low blood sugar.  Recommend a low sodium foods. Recommend MVI with iron. Get Vitamin B12 rechecked and treat this.   Aim for 3 Carbohydrate choices (45 grams) per meal Try to have consistency with meal timing. Try increasing your fiber to help have more regular bowel movements.   Teaching Method Utilized:  Visual Auditory Hands on  Handouts given during visit include:  Meal plan card  Label reading  Snack list  Low sodium sheet for CHF  High iron foods  Barriers to learning/adherence to lifestyle change: increased stress  Demonstrated degree of understanding via:  Teach Back   Monitoring/Evaluation:  Dietary intake, exercise, label reading, and body weight in 6 week(s).

## 2014-11-08 NOTE — Patient Instructions (Signed)
Please check blood sugars at least half the time about 2 hours after any meal and 3 times per week on waking up. Please bring blood sugar monitor to each visit. Recommended blood sugar levels about 2 hours after meal is 140-180 and on waking up 90-130  May need Novolog 5-6 units with main meal of the day

## 2014-11-09 LAB — FRUCTOSAMINE: Fructosamine: 226 umol/L (ref 0–285)

## 2014-11-12 ENCOUNTER — Other Ambulatory Visit: Payer: Self-pay | Admitting: Endocrinology

## 2014-11-13 ENCOUNTER — Telehealth: Payer: Self-pay | Admitting: Endocrinology

## 2014-11-13 ENCOUNTER — Other Ambulatory Visit: Payer: Self-pay | Admitting: *Deleted

## 2014-11-13 ENCOUNTER — Ambulatory Visit
Admission: RE | Admit: 2014-11-13 | Discharge: 2014-11-13 | Disposition: A | Discharge status: Home / Self Care | Payer: BC Managed Care – PPO | Source: Ambulatory Visit | Attending: Otolaryngology | Admitting: Otolaryngology

## 2014-11-13 ENCOUNTER — Other Ambulatory Visit (HOSPITAL_COMMUNITY)
Admission: RE | Admit: 2014-11-13 | Discharge: 2014-11-13 | Disposition: A | Discharge status: Home / Self Care | Payer: BC Managed Care – PPO | Source: Ambulatory Visit | Attending: Interventional Radiology | Admitting: Interventional Radiology

## 2014-11-13 DIAGNOSIS — E041 Nontoxic single thyroid nodule: Secondary | ICD-10-CM | POA: Insufficient documentation

## 2014-11-13 MED ORDER — INSULIN GLARGINE 100 UNIT/ML SOLOSTAR PEN: PEN_INJECTOR | SUBCUTANEOUS | Status: DC

## 2014-11-13 NOTE — Telephone Encounter (Signed)
Please call pharmacy to address expense on the lantus trulicity is out of stock

## 2014-11-20 ENCOUNTER — Telehealth: Payer: Self-pay | Admitting: Endocrinology

## 2014-11-20 ENCOUNTER — Other Ambulatory Visit: Payer: Self-pay | Admitting: *Deleted

## 2014-11-20 MED ORDER — INSULIN PEN NEEDLE 32G X 4 MM MISC: Status: DC

## 2014-11-20 MED ORDER — GLUCOSE BLOOD VI STRP: ORAL_STRIP | Status: DC

## 2014-11-20 NOTE — Telephone Encounter (Signed)
Pt needs rx for testing strips for ultra mini and pen needles utra fine

## 2014-11-21 NOTE — Telephone Encounter (Signed)
Rx sent to pharmacy per pt's request.  

## 2014-12-20 ENCOUNTER — Ambulatory Visit (INDEPENDENT_AMBULATORY_CARE_PROVIDER_SITE_OTHER): Payer: BC Managed Care – PPO | Admitting: Endocrinology

## 2014-12-20 ENCOUNTER — Encounter: Payer: Self-pay | Admitting: Endocrinology

## 2014-12-20 ENCOUNTER — Ambulatory Visit: Payer: BC Managed Care – PPO | Admitting: Dietician

## 2014-12-20 VITALS — BP 137/65 | HR 90 | Temp 98.3°F | Resp 16 | Ht 67.0 in | Wt 209.8 lb

## 2014-12-20 DIAGNOSIS — E1165 Type 2 diabetes mellitus with hyperglycemia: Secondary | ICD-10-CM

## 2014-12-20 DIAGNOSIS — E041 Nontoxic single thyroid nodule: Secondary | ICD-10-CM

## 2014-12-20 DIAGNOSIS — IMO0002 Reserved for concepts with insufficient information to code with codable children: Secondary | ICD-10-CM

## 2014-12-20 LAB — TSH: TSH: 1.07 u[IU]/mL (ref 0.35–4.50)

## 2014-12-20 LAB — GLUCOSE, RANDOM: Glucose, Bld: 142 mg/dL — ABNORMAL HIGH (ref 70–99)

## 2014-12-20 LAB — HEMOGLOBIN A1C: HEMOGLOBIN A1C: 6.7 % — AB (ref 4.6–6.5)

## 2014-12-20 NOTE — Patient Instructions (Addendum)
Please check blood sugars at least half the time about 2 hours after any meal and 3 times per week on waking up.   Please bring blood sugar monitor to each visit. Recommended blood sugar levels about 2 hours after meal is 140-180 and on waking up 90-130  LANTUS: take 10 units daily at night

## 2014-12-20 NOTE — Progress Notes (Signed)
Patient ID: Teresa Poole, female   DOB: 05/19/1951, 64 y.o.   MRN: [id number]           Reason for Appointment: Follow-up for Type 2 Diabetes  Referring physician: Fulp  History of Present Illness:          Diagnosis: Type 2 diabetes mellitus, date of diagnosis: 2000        Past history: At diagnosis she was having fatigue and she was initially treated with metformin Metformin was stopped about 1-1/2 years ago when she decided not to take any medications. She was in the hospital in 08/2013 and her blood sugar was about 460 with A1c of 15. She was started on Lantus and NovoLog and has been continued on this regimen since then. Has not been back on any oral hypoglycemic drugs in the past year  Recent history:   INSULIN regimen is described as: Lantus 8 q am, Novolog none  On her initial consultation she was started on Trulicity to help with her postprandial readings and also NovoLog for coverage at her main meal at lunch She has lost more weight with continuing Trulicity. Also she has seen the dietitian and his taking changes in her meals and snacks She thinks she is usually eating small portions  She has been trying to walk and also used the recumbent bike at times Control level is improved as judged by fructosamine 3/16 but does not have any recent A1c  Lantus insulin: A prescription was renewed based on previous instructions and this was to take it in the morning.  Previously was taking the Lantus at night and was taking 14 units but now on her own she has taken only 8 units  Current blood sugar patterns and problems identified:  Fasting blood sugars mildly increased although slightly better recently  Blood sugars are somewhat better in the afternoon  Has only a few readings after supper and none recently.  However these are not increasing and are averaging about 127  No hypoglycemia since her last visit       Oral hypoglycemic drugs the patient is taking are: None        Side effects from medications have been: None Compliance with the medical regimen: Fair   Glucose monitoring:  done up to 3 times a day, not consistently         Glucometer: One Touch.      Blood Glucose readings by time of day and averages from meter download:  PRE-MEAL  9-11 AM   midday  Dinner  PCS  Overall  Glucose range:  99-163   95-201    99-164    Median:  138   137    109   131     Self-care:     Meals: 3 meals per day. Breakfast is meat, egg, potatoes and sometimes oatmeal/fruit.  Does avoid fried food  Dinner 5 pm        Exercise: walking at work, recumbent bike at times           Dietician visit, most recent: never.               Weight history: 205-245  Wt Readings from Last 3 Encounters:  12/20/14 209 lb 12.8 oz (95.165 kg)  11/08/14 217 lb (98.431 kg)  11/08/14 217 lb (98.431 kg)    Glycemic control:   Recent A1c on 09/28/14 is 6.4  Lab Results  Component Value Date   HGBA1C 15.0* 09/07/2013  Lab Results  Component Value Date   CREATININE 1.55* 11/08/2014   Microalbumin creatinine ratio on 09/28/14 = 3730 and creatinine clearance 46 with creatinine level I.4      Medication List       This list is accurate as of: 12/20/14  4:00 PM.  Always use your most recent med list.               amLODipine 10 MG tablet  Commonly known as:  NORVASC  Take 1 tablet (10 mg total) by mouth daily.     aspirin 81 MG EC tablet  Take 1 tablet (81 mg total) by mouth daily.     carvedilol 12.5 MG tablet  Commonly known as:  COREG  Take 1 tablet (12.5 mg total) by mouth 2 (two) times daily with a meal.     FERRO-PLEX HEMATINIC 115-1 MG Tabs  Take by mouth.     fluticasone 50 MCG/ACT nasal spray  Commonly known as:  FLONASE  Place 1 spray into both nostrils as needed.     furosemide 20 MG tablet  Commonly known as:  LASIX  TAKE 1 TABLET BY MOUTH EVERY DAY AS NEEDED(WEIGHT GAIN 3 LBS IN A DAY OR 5 LBS IN A WEEK)     glucose blood test strip  Commonly known  as:  ONE TOUCH ULTRA TEST  Use to check blood sugar 3 times daily Dx code E11.65     insulin aspart 100 UNIT/ML injection  Commonly known as:  novoLOG  Inject 3-12 Units into the skin 4 (four) times daily -  before meals and at bedtime. Uses a sliding scale.     Insulin Glargine 100 UNIT/ML Solostar Pen  Commonly known as:  LANTUS SOLOSTAR  Inject 50 units into the skin every morning, adjust according to sliding scale     Insulin Pen Needle 32G X 4 MM Misc  Commonly known as:  BD PEN NEEDLE NANO U/F  Use 4 per day     isosorbide-hydrALAZINE 20-37.5 MG per tablet  Commonly known as:  BIDIL  Take 2 tablets by mouth 2 (two) times daily.     nitroGLYCERIN 0.4 MG SL tablet  Commonly known as:  NITROSTAT  Place 1 tablet (0.4 mg total) under the tongue every 5 (five) minutes as needed for chest pain.     oxymetazoline 0.05 % nasal spray  Commonly known as:  AFRIN  Place 1 spray into both nostrils daily as needed for congestion.     PROAIR HFA 108 (90 BASE) MCG/ACT inhaler  Generic drug:  albuterol     simvastatin 40 MG tablet  Commonly known as:  ZOCOR     sodium chloride 0.65 % Soln nasal spray  Commonly known as:  OCEAN  Place 1 spray into both nostrils daily as needed for congestion.     TRULICITY [id number] 0000000 Sopn  Generic drug:  Dulaglutide  INJECT 0.75 MG INTO THE SKIN ONCE A WEEK     Vitamin B-12 500 MCG Subl  Place under the tongue.        Allergies: No Known Allergies  Past Medical History  Diagnosis Date  . Diabetes mellitus without complication   . Hypertension   . History of ovarian cancer   . Obesity (BMI 30-39.9)   . Chronic combined systolic and diastolic CHF (congestive heart failure)   . NICM (nonischemic cardiomyopathy)     a. Echocardiogram (09/08/2013): Mild LVH, EF 30-35%, no WMA, mild LAE.  Marland Kitchen CAD (coronary  artery disease)     non-obstructive by LHC (09/25/2013): Proximal and mid LAD serial 20%, proximal circumflex 30%, mid AV groove circumflex  30%, mid RCA mild plaque.  . Carotid stenosis     a. Carotid US (09/26/2011): Bilateral 1-39% ICA.  Marland Kitchen CKD (chronic kidney disease)   . Hyperlipidemia     Past Surgical History  Procedure Laterality Date  . Abdominal hysterectomy    . Left heart catheterization with coronary angiogram N/A 09/25/2013    Procedure: LEFT HEART CATHETERIZATION WITH CORONARY ANGIOGRAM;  Surgeon: Burnell Blanks, MD;  Location: [facility] CATH [facility];  Service: Cardiovascular;  Laterality: N/A;    Family History  Problem Relation Age of Onset  . Diabetes Father     Social History:  reports that she has never smoked. She has never used smokeless tobacco. She reports that she does not drink alcohol or use illicit drugs.    Review of Systems    She has a 2.8 cm mixed echogenic predominantly solid nodule on the right side and a biopsy was benign       Lipids: Not tested recently, has been treated with Lipitor by PCP LDL in 11/2012 was 89      No results found for: CHOL, HDL, LDLCALC, LDLDIRECT, TRIG, CHOLHDL                 The blood pressure has been Controlled with carvedilol.  Is also on hydralazine/isosorbide for history of CHF Previously had been on lisinopril and this was stopped.  Thyroid does have proteinuria      Her B12 was normal in 3/16 despite having a low level last year She has started taking B-12 1 her own   LABS:  No visits with results within 1 Week(s) from this visit. Latest known visit with results is:  Office Visit on 11/08/2014  Component Date Value Ref Range Status  . Sodium 11/08/2014 141  135 - 145 mEq/L Final  . Potassium 11/08/2014 3.8  3.5 - 5.1 mEq/L Final  . Chloride 11/08/2014 107  96 - 112 mEq/L Final  . CO2 11/08/2014 29  19 - 32 mEq/L Final  . Glucose, Bld 11/08/2014 139* 70 - 99 mg/dL Final  . BUN 11/08/2014 29* 6 - 23 mg/dL Final  . Creatinine, Ser 11/08/2014 1.55* 0.40 - 1.20 mg/dL Final  . Calcium 11/08/2014 9.2  8.4 - 10.5 mg/dL Final  . GFR 11/08/2014 43.45*  >60.00 mL/min Final  . Vitamin B-12 11/08/2014 377  211 - 911 pg/mL Final  . Fructosamine 11/08/2014 226  0 - 285 umol/L Final   Comment: Published reference interval for apparently healthy subjects between age 67 and 30 is 50 - 285 umol/L and in a poorly controlled diabetic population is 228 - 563 umol/L with a mean of 396 umol/L.     Physical Examination:  BP 137/65 mmHg  Pulse 90  Temp(Src) 98.3 F (36.8 C)  Resp 16  Ht 5\' 7"  (1.702 m)  Wt 209 lb 12.8 oz (95.165 kg)  BMI 32.85 kg/m2  SpO2 97%   ASSESSMENT:  Diabetes type 2, uncontrolled with obesity and BMI of 33 Her blood sugars are progressively better with adding Trulicity to her insulin regimen She is now requiring relatively low doses of basal insulin However since her fasting readings are slightly higher than in the afternoon she can switch her Lantus to the evening and tried 10 units instead of 8   PLAN:   Discussed needing to check readings after meals  including Lantus and supper Increase Lantus as above Check A1c today  Patient Instructions  Please check blood sugars at least half the time about 2 hours after any meal and 3 times per week on waking up.   Please bring blood sugar monitor to each visit. Recommended blood sugar levels about 2 hours after meal is 140-180 and on waking up 90-130  LANTUS: take 10 units daily at night    Counseling time over 50% of today's 25 minute visit   Nima Kemppainen 12/20/2014, 4:00 PM   Note: This office note was prepared with Estate agent. Any transcriptional errors that result from this process are unintentional.

## 2014-12-21 NOTE — Progress Notes (Signed)
Quick Note:  Please let patient know that the diabetes control test is excellent at 6.7, thyroid test okay Continue treatment plan as discussed ______

## 2015-01-28 ENCOUNTER — Telehealth: Payer: Self-pay | Admitting: Endocrinology

## 2015-01-28 NOTE — Telephone Encounter (Signed)
Patient stated that she need a 100 % print out of everything that she has had done in this office including what she has paid. Please advise

## 2015-01-28 NOTE — Telephone Encounter (Signed)
She needs to contact Medical records and billing for that.

## 2015-02-25 ENCOUNTER — Other Ambulatory Visit: Payer: Self-pay | Admitting: Endocrinology

## 2015-02-28 ENCOUNTER — Other Ambulatory Visit: Payer: Self-pay | Admitting: *Deleted

## 2015-02-28 MED ORDER — DULAGLUTIDE 0.75 MG/0.5ML ~~LOC~~ SOAJ: SUBCUTANEOUS | Status: DC

## 2015-03-25 ENCOUNTER — Other Ambulatory Visit: Payer: BC Managed Care – PPO

## 2015-03-28 ENCOUNTER — Ambulatory Visit: Payer: BC Managed Care – PPO | Admitting: Endocrinology

## 2015-04-17 ENCOUNTER — Ambulatory Visit: Payer: BC Managed Care – PPO | Admitting: Endocrinology

## 2015-05-09 ENCOUNTER — Ambulatory Visit: Payer: BC Managed Care – PPO | Admitting: Endocrinology

## 2015-05-20 ENCOUNTER — Other Ambulatory Visit: Payer: Self-pay | Admitting: *Deleted

## 2015-05-20 MED ORDER — DULAGLUTIDE 0.75 MG/0.5ML ~~LOC~~ SOAJ: SUBCUTANEOUS | Status: DC

## 2015-06-27 ENCOUNTER — Other Ambulatory Visit: Payer: Self-pay | Admitting: Endocrinology

## 2015-07-15 ENCOUNTER — Telehealth: Payer: Self-pay | Admitting: Endocrinology

## 2015-07-15 ENCOUNTER — Other Ambulatory Visit: Payer: Self-pay | Admitting: *Deleted

## 2015-07-15 NOTE — Telephone Encounter (Signed)
Patient Name: Teresa Poole Gender: Female DOB: Dec 14, 1950 Age: 64 Y 57 M 9 D Return Phone Number: Address: City/State/Zip: [place] [employer] Endocrinology Night - Client Client Site [facility]ician Kumar, [place] Type Call Call Type Triage / [place] Name [place] [phone] Relationship To Patient Other Return Phone Number Please choose phone number Chief Complaint Prescription Refill or Medication Request (non symptomatic) Initial Comment [facility] pharmacy, urgent request for refill. Nurse Assessment Nurse: Ruthann Cancer RN, Apolonio Schneiders Date/Time Eilene Ghazi Time): 07/14/2015 12:40:43 PM Confirm and document reason for call. If symptomatic, describe symptoms. ---Caller is [place] from [employer]. States pt is out of her medication Trulicity & she needs to inject today. Per client directive it is ok to refill volume quantity sufficient until office reopens. Authorized refill for today with no additional refills.

## 2015-07-15 NOTE — Telephone Encounter (Signed)
Patient has not been seen since April, nurse should not have refilled rx because it had been denied.

## 2015-07-25 ENCOUNTER — Other Ambulatory Visit: Payer: Self-pay | Admitting: Cardiology

## 2015-07-25 DIAGNOSIS — I150 Renovascular hypertension: Secondary | ICD-10-CM

## 2015-08-04 ENCOUNTER — Other Ambulatory Visit: Payer: Self-pay | Admitting: Family Medicine

## 2015-08-05 ENCOUNTER — Other Ambulatory Visit: Payer: Self-pay | Admitting: *Deleted

## 2015-08-05 ENCOUNTER — Encounter: Payer: Self-pay | Admitting: Endocrinology

## 2015-08-05 ENCOUNTER — Ambulatory Visit
Admission: RE | Admit: 2015-08-05 | Discharge: 2015-08-05 | Disposition: A | Discharge status: Home / Self Care | Payer: BC Managed Care – PPO | Source: Ambulatory Visit | Attending: Cardiology | Admitting: Cardiology

## 2015-08-05 ENCOUNTER — Ambulatory Visit (INDEPENDENT_AMBULATORY_CARE_PROVIDER_SITE_OTHER): Payer: BC Managed Care – PPO | Admitting: Endocrinology

## 2015-08-05 VITALS — BP 164/98 | HR 77 | Temp 98.3°F | Resp 16 | Ht 67.0 in | Wt 207.6 lb

## 2015-08-05 DIAGNOSIS — E119 Type 2 diabetes mellitus without complications: Secondary | ICD-10-CM | POA: Diagnosis not present

## 2015-08-05 DIAGNOSIS — I150 Renovascular hypertension: Secondary | ICD-10-CM

## 2015-08-05 DIAGNOSIS — E785 Hyperlipidemia, unspecified: Secondary | ICD-10-CM

## 2015-08-05 DIAGNOSIS — Z23 Encounter for immunization: Secondary | ICD-10-CM

## 2015-08-05 LAB — POCT GLYCOSYLATED HEMOGLOBIN (HGB A1C): HEMOGLOBIN A1C: 5.9

## 2015-08-05 MED ORDER — DULAGLUTIDE 1.5 MG/0.5ML ~~LOC~~ SOAJ: SUBCUTANEOUS | Status: DC

## 2015-08-05 NOTE — Progress Notes (Signed)
Patient ID: Teresa Poole, female   DOB: [birth year], 64 y.o.   MRN: [id number]           Reason for Appointment: Follow-up for Type 2 Diabetes  Referring physician: Fulp  History of Present Illness:          Diagnosis: Type 2 diabetes mellitus, date of diagnosis: 2000        Past history: At diagnosis she was having fatigue and she was initially treated with metformin Metformin was stopped about 1-1/2 years ago when she decided not to take any medications. She was in the hospital in 08/2013 and her blood sugar was about 460 with A1c of 15. She was started on Lantus and NovoLog and has been continued on this regimen since then. Has not been back on any oral hypoglycemic drugs in the past year  Recent history:   INSULIN regimen is described as: Lantus 18 q am  On her initial consultation she was started on Trulicity to help with her postprandial readings  Although she had better control with Trulicity and did not require mealtime insulin she has not followed up since 4/16 She appears to have further improvement in her control and A1c is down to 5.9 This is despite only 2 pounds weight loss since her last visit  Current blood sugar patterns and problems identified:  Fasting blood sugars are reportedly near normal at home  Although she was down to 8 units of Lantus on her last visit she is now taking 18 on her own  She thinks she may feel hypoglycemic if she is very active with blood sugars as low as 68  She is not doing any POSTPRANDIAL readings as directed  She is able to control portions fairly well and snacks with continuing Trulicity A999333 mg weekly       Oral hypoglycemic drugs the patient is taking are: None      Side effects from medications have been: None Compliance with the medical regimen: Fair  Glucose monitoring:  done qd, recent range 90+-120 5 in the morning    Glucometer: One Touch.       Self-care:     Meals: 3 meals per day. Breakfast is meat, egg,  potatoes and sometimes oatmeal/fruit.  Does avoid fried food  Dinner 5 pm        Exercise: walking at work, recumbent bike also        [place] visit, most recent: never.               Weight history: 205-245  Wt Readings from Last 3 Encounters:  08/05/15 207 lb 9.6 oz (94.167 kg)  12/20/14 209 lb 12.8 oz (95.165 kg)  11/08/14 217 lb (98.431 kg)    Glycemic control:   A1c on 09/28/14 = 6.4  Lab Results  Component Value Date   HGBA1C 5.9 08/05/2015   HGBA1C 6.7* 12/20/2014   HGBA1C 15.0* 09/07/2013   Lab Results  Component Value Date   CREATININE 1.55* 11/08/2014   Microalbumin creatinine ratio on 09/28/14 = 3730 and creatinine clearance 46 with creatinine level I.4      Medication List       This list is accurate as of: 08/05/15  5:13 PM.  Always use your most recent med list.               amLODipine 10 MG tablet  Commonly known as:  NORVASC  Take 1 tablet (10 mg total) by mouth daily.  aspirin 81 MG EC tablet  Take 1 tablet (81 mg total) by mouth daily.     carvedilol 12.5 MG tablet  Commonly known as:  COREG  Take 1 tablet (12.5 mg total) by mouth 2 (two) times daily with a meal.     Dulaglutide 0.75 MG/0.5ML Sopn  Commonly known as:  TRULICITY  INJECT [id number] MG INTO THE SKIN ONCE A WEEK, NEEDS APPOINTMENT FOR FURTHER REFILLS.     Dulaglutide 1.5 MG/0.5ML Sopn  Commonly known as:  TRULICITY  Inject the contents of one pen once per week     FERRO-PLEX HEMATINIC 115-1 MG Tabs  Take by mouth.     fluticasone 50 MCG/ACT nasal spray  Commonly known as:  FLONASE  Place 1 spray into both nostrils as needed.     furosemide 20 MG tablet  Commonly known as:  LASIX  TAKE 1 TABLET BY MOUTH EVERY DAY AS NEEDED(WEIGHT GAIN 3 LBS IN A DAY OR 5 LBS IN A WEEK)     glucose blood test strip  Commonly known as:  ONE TOUCH ULTRA TEST  Use to check blood sugar 3 times daily Dx code E11.65     insulin aspart 100 UNIT/ML injection  Commonly known as:  novoLOG    Inject 3-12 Units into the skin 4 (four) times daily -  before meals and at bedtime. Uses a sliding scale.     Insulin Glargine 100 UNIT/ML Solostar Pen  Commonly known as:  LANTUS SOLOSTAR  Inject 50 units into the skin every morning, adjust according to sliding scale     Insulin Pen Needle 32G X 4 MM Misc  Commonly known as:  BD PEN NEEDLE NANO U/F  Use 4 per day     isosorbide-hydrALAZINE 20-37.5 MG tablet  Commonly known as:  BIDIL  Take 2 tablets by mouth 2 (two) times daily.     lisinopril 40 MG tablet  Commonly known as:  PRINIVIL,ZESTRIL  Take 40 mg by mouth daily.     nitroGLYCERIN 0.4 MG SL tablet  Commonly known as:  NITROSTAT  Place 1 tablet (0.4 mg total) under the tongue every 5 (five) minutes as needed for chest pain.     oxymetazoline 0.05 % nasal spray  Commonly known as:  AFRIN  Place 1 spray into both nostrils daily as needed for congestion.     PROAIR HFA 108 (90 BASE) MCG/ACT inhaler  Generic drug:  albuterol     simvastatin 40 MG tablet  Commonly known as:  ZOCOR     sodium chloride 0.65 % Soln nasal spray  Commonly known as:  OCEAN  Place 1 spray into both nostrils daily as needed for congestion.     Vitamin B-12 500 MCG Subl  Place under the tongue.     Vitamin D (Ergocalciferol) 50000 UNITS Caps capsule  Commonly known as:  DRISDOL  Take 50,000 Units by mouth every 7 (seven) days.        Allergies: No Known Allergies  Past Medical History  Diagnosis Date  . Diabetes mellitus without complication ([place])   . Hypertension   . History of ovarian cancer   . Obesity (BMI 30-39.9)   . Chronic combined systolic and diastolic CHF (congestive heart failure) ([place])   . NICM (nonischemic cardiomyopathy) ([place])     a. Echocardiogram (09/08/2013): Mild LVH, EF 30-35%, no WMA, mild LAE.  Marland Kitchen CAD (coronary artery disease)     non-obstructive by LHC (09/25/2013): Proximal and mid LAD serial 20%,  proximal circumflex 30%, mid AV groove circumflex 30%, mid RCA  mild plaque.  . Carotid stenosis     a. Carotid US (09/26/2011): Bilateral 1-39% ICA.  Marland Kitchen CKD (chronic kidney disease)   . Hyperlipidemia     Past Surgical History  Procedure Laterality Date  . Abdominal hysterectomy    . Left heart catheterization with coronary angiogram N/A 09/25/2013    Procedure: LEFT HEART CATHETERIZATION WITH CORONARY ANGIOGRAM;  Surgeon: Burnell Blanks, MD;  Location: [facility] CATH [facility];  Service: Cardiovascular;  Laterality: N/A;    Family History  Problem Relation Age of Onset  . Diabetes Father     Social History:  reports that she has never smoked. She has never used smokeless tobacco. She reports that she does not drink alcohol or use illicit drugs.    Review of Systems   Has recent increased constipation  Thyroid: She has a 2.8 cm mixed echogenic predominantly solid nodule on the right side and a biopsy was benign       Lipids:  has been treated with Lipitor by PCP with good control        No results found for: CHOL, HDL, LDLCALC, LDLDIRECT, TRIG, CHOLHDL                 The blood pressure has been Controlled with carvediloland lisinopril .  Is also on hydralazine/isosorbide for history of CHF   She is asking about night sweats recently     LABS:  Office Visit on 08/05/2015  Component Date Value Ref Range Status  . Hemoglobin A1C 08/05/2015 5.9   Final    Physical Examination:  BP 164/98 mmHg  Pulse 77  Temp(Src) 98.3 F (36.8 C)  Resp 16  Ht 5\' 7"  (1.702 m)  Wt 207 lb 9.6 oz (94.167 kg)  BMI 32.51 kg/m2  SpO2 98%   ASSESSMENT:  Diabetes type 2, uncontrolled with obesity and BMI of 33 Her blood sugars are progressively better with adding Trulicity to her insulin regimen Even though she is taking only 0.75 mg her A1c is now 5.9 No hypoglycemia reported unless she is very active   She is now requiring relatively low doses of basal insulin and not clear if she needs this and may benefit from higher doses of Trulicity and can be  tried off her insulin  HYPERTENSION: Blood pressure is relatively high today but she thinks this is from  rushing and usually blood pressure is well-controlled   PLAN:    Increase Trulicity to 1.5 mg starting this weekend  Given the patient  tapering schedule for the Lantus insulin as long as fasting blood sugars stay under 130.  She will take 14 units for now, 8 units this coming weekend and then stop after 1 week  Start monitoring blood sugars more after meals rather than just in the morning  Bring blood sugar monitor on each visit  May try clonidine for night sweats but she will need to discuss with cardiologist    She will also need to follow-up with cardiologist and PCP for hypertension   she will discuss constipation with PCP, likely to be related to Trulicity which she has taken for almost a year now  Continue exercise regimen  Balanced meals with some protein at each meal  Counseling time on subjects discussed above is over 50% of today's 25 minute visit   Edelin Fryer 08/05/2015, 5:13 PM   Note: This office note was prepared with Dragon voice recognition system technology. Any  transcriptional errors that result from this process are unintentional.

## 2015-08-05 NOTE — Patient Instructions (Addendum)
Trulicity 1.5mg  daily  Lantus 14 units till Sunday, then drop it to 8 units and then stop  Check with Dr Einar Gip about CLONIDE  Check blood sugars on waking up 2-3  times a week Also check blood sugars about 2 hours after a meal and do this after different meals by rotation  Recommended blood sugar levels on waking up is 90-130 and about 2 hours after meal is 130-160  Please bring your blood sugar monitor to each visit, thank you

## 2015-09-09 ENCOUNTER — Other Ambulatory Visit: Payer: Self-pay | Admitting: *Deleted

## 2015-09-09 ENCOUNTER — Telehealth: Payer: Self-pay | Admitting: Endocrinology

## 2015-09-09 MED ORDER — DULAGLUTIDE 1.5 MG/0.5ML ~~LOC~~ SOAJ: SUBCUTANEOUS | Status: DC

## 2015-09-09 NOTE — Telephone Encounter (Signed)
Has she been getting the 1.5 dosage or not ?  She was told to increase last month and taper off the insulin if blood sugars were okay

## 2015-09-09 NOTE — Telephone Encounter (Signed)
rx sent for Trulicity 1.5

## 2015-09-09 NOTE — Telephone Encounter (Signed)
Please see below and advise.

## 2015-09-09 NOTE — Telephone Encounter (Signed)
Patient Name: Teresa Poole Gender: Female DOB: 1951-04-21 Age: 65 Y 58 M 4 D Return Phone Number: Address: City/State/Zip: [place] [employer] Endocrinology Night - Client Client Site [facility] Elayne Snare Contact Type Call Call Type Triage / Clinical Caller Name [place] from [place], [phone] Relationship To Patient Provider Return Phone Number Please choose phone number Chief Complaint Paging or Request for Consult Initial Comment Caller states needing clarification on rx Nurse Assessment Nurse: Harlow Mares, RN, Suanne Marker Date/Time Eilene Ghazi Time): 09/08/2015 12:31:43 PM Please select the assessment type ---Pharmacy clarification Additional Documentation ---Caller states needing clarification on rx. Is there an on-call physician for the client? ---Yes Do the client directives allow paging the on call for medication concerns? ---Yes Document information that requires clarification. ---Has been taking Trulicity and is not sure what the strength is currently. Patient told her that they are taking her off of her Lantus, but the script for Truilicity is lower than last script. Pharmacist gave the patient a loaner dose but would like MD office to call and clarify current dosage of this med. Additional Documentation ---Advised that nurse will send this request to MD office.

## 2015-09-11 ENCOUNTER — Other Ambulatory Visit (INDEPENDENT_AMBULATORY_CARE_PROVIDER_SITE_OTHER): Payer: BC Managed Care – PPO

## 2015-09-11 DIAGNOSIS — E119 Type 2 diabetes mellitus without complications: Secondary | ICD-10-CM | POA: Diagnosis not present

## 2015-09-11 DIAGNOSIS — E1165 Type 2 diabetes mellitus with hyperglycemia: Secondary | ICD-10-CM

## 2015-09-11 DIAGNOSIS — IMO0002 Reserved for concepts with insufficient information to code with codable children: Secondary | ICD-10-CM

## 2015-09-11 LAB — COMPREHENSIVE METABOLIC PANEL
ALT: 9 U/L (ref 0–35)
AST: 13 U/L (ref 0–37)
Albumin: 3.8 g/dL (ref 3.5–5.2)
Alkaline Phosphatase: 38 U/L — ABNORMAL LOW (ref 39–117)
BILIRUBIN TOTAL: 0.4 mg/dL (ref 0.2–1.2)
BUN: 27 mg/dL — AB (ref 6–23)
CALCIUM: 8.9 mg/dL (ref 8.4–10.5)
CHLORIDE: 107 meq/L (ref 96–112)
CO2: 26 meq/L (ref 19–32)
CREATININE: 1.65 mg/dL — AB (ref 0.40–1.20)
GFR: 40.32 mL/min — ABNORMAL LOW (ref >60.00)
GLUCOSE: 166 mg/dL — AB (ref 70–99)
Potassium: 4.4 mEq/L (ref 3.5–5.1)
SODIUM: 141 meq/L (ref 135–145)
Total Protein: 6.8 g/dL (ref 6.0–8.3)

## 2015-09-11 LAB — LIPID PANEL
CHOL/HDL RATIO: 3
Cholesterol: 133 mg/dL (ref 0–200)
HDL: 43 mg/dL (ref >39.00)
LDL CALC: 73 mg/dL (ref 0–99)
NONHDL: 90.14
Triglycerides: 84 mg/dL (ref 0.0–149.0)
VLDL: 16.8 mg/dL (ref 0.0–40.0)

## 2015-09-12 LAB — FRUCTOSAMINE: Fructosamine: 251 umol/L (ref 0–285)

## 2015-09-16 ENCOUNTER — Encounter: Payer: Self-pay | Admitting: Endocrinology

## 2015-09-16 ENCOUNTER — Ambulatory Visit (INDEPENDENT_AMBULATORY_CARE_PROVIDER_SITE_OTHER): Payer: BC Managed Care – PPO | Admitting: Endocrinology

## 2015-09-16 VITALS — BP 160/92 | HR 81 | Temp 98.9°F | Resp 16 | Ht 67.0 in | Wt 207.4 lb

## 2015-09-16 DIAGNOSIS — E1165 Type 2 diabetes mellitus with hyperglycemia: Secondary | ICD-10-CM

## 2015-09-16 MED ORDER — METFORMIN HCL ER 500 MG PO TB24: 1000.0000 mg | ORAL_TABLET | Freq: Every day | ORAL | Status: DC

## 2015-09-16 NOTE — Patient Instructions (Addendum)
Check blood sugars on waking up 2  times a week Also check blood sugars about 2 hours after a meal and do this after different meals by rotation  Recommended blood sugar levels on waking up is 90-130 and about 2 hours after meal is 130-160  Please bring your blood sugar monitor to each visit, thank you  Diet: avoid fast food  Metformin: 1 at dinner for 7 days then 2 at dinner daily

## 2015-09-16 NOTE — Progress Notes (Signed)
Patient ID: Teresa Poole, female   DOB: Dec 27, 1950, 65 y.o.   MRN: [id number]           Reason for Appointment: Follow-up for Type 2 Diabetes  Referring physician: Fulp  History of Present Illness:          Diagnosis: Type 2 diabetes mellitus, date of diagnosis: 2000        Past history: At diagnosis she was having fatigue and she was initially treated with metformin Metformin was stopped about 1-1/2 years ago when she decided not to take any medications. She was in the hospital in 08/2013 and her blood sugar was about 460 with A1c of 15. She was started on Lantus and NovoLog and has been continued on this regimen since then. Has not been back on any oral hypoglycemic drugs in the past year  Recent history:   INSULIN regimen is described as : None  On her initial consultation she was started on Trulicity to help with her postprandial readings  this was increased to 1.5 mg in 12/16 With this she has been able to progressively reduce her insulin and stop since late 12 /16    She did not bring her monitor for download again  Current blood sugar patterns and problems identified:  Fasting blood sugars are reportedly about 120   She is concerned about her sugars being overall somewhat higher but does not think they are more than 160 after lunch   fructosamine is 251 but was better previously  She is still having difficulty losing weight.  Now she thinks that she is eating out more and not watching the fat intake    No further symptoms of hypoglycemia as before       Oral hypoglycemic drugs the patient is taking are: None      Side effects from medications have been: None Compliance with the medical regimen: Fair  Glucose monitoring:  done qd , readings by recall:   PRE-MEAL Fasting Lunch Dinner Bedtime Overall  Glucose range: 120-125  <160    Mean/median:            Glucometer: One Touch.       Self-care:     Meals: 3 meals per day. Breakfast is meat, egg, potatoes  and sometimes oatmeal/fruit.  Does avoid fried food but recently getting more fast food    Dinner 5 pm        Exercise: walking at work, recumbent bike also        [facility] visit, most recent: never.               Weight history: 205-245  Wt Readings from Last 3 Encounters:  09/16/15 207 lb 6.4 oz (94.076 kg)  08/05/15 207 lb 9.6 oz (94.167 kg)  12/20/14 209 lb 12.8 oz (95.165 kg)    Glycemic control:   A1c on 09/28/14 = 6.4  Lab Results  Component Value Date   HGBA1C 5.9 08/05/2015   HGBA1C 6.7* 12/20/2014   HGBA1C 15.0* 09/07/2013   Lab Results  Component Value Date   LDLCALC 73 09/11/2015   CREATININE 1.65* 09/11/2015   Microalbumin creatinine ratio on 09/28/14 = 3730 and creatinine clearance 46 with creatinine level I.4  Appointment on 09/11/2015  Component Date Value Ref Range Status  . Sodium 09/11/2015 141  135 - 145 mEq/L Final  . Potassium 09/11/2015 4.4  3.5 - 5.1 mEq/L Final  . Chloride 09/11/2015 107  96 - 112 mEq/L Final  . CO2  09/11/2015 26  19 - 32 mEq/L Final  . Glucose, Bld 09/11/2015 166* 70 - 99 mg/dL Final  . BUN 09/11/2015 27* 6 - 23 mg/dL Final  . Creatinine, Ser 09/11/2015 1.65* 0.40 - 1.20 mg/dL Final  . Total Bilirubin 09/11/2015 0.4  0.2 - 1.2 mg/dL Final  . Alkaline Phosphatase 09/11/2015 38* 39 - 117 U/L Final  . AST 09/11/2015 13  0 - 37 U/L Final  . ALT 09/11/2015 9  0 - 35 U/L Final  . Total Protein 09/11/2015 6.8  6.0 - 8.3 g/dL Final  . Albumin 09/11/2015 3.8  3.5 - 5.2 g/dL Final  . Calcium 09/11/2015 8.9  8.4 - 10.5 mg/dL Final  . GFR 09/11/2015 40.32* >60.00 mL/min Final  . Fructosamine 09/11/2015 251  0 - 285 umol/L Final   Comment: Published reference interval for apparently healthy subjects between age 92 and 46 is 78 - 285 umol/L and in a poorly controlled diabetic population is 228 - 563 umol/L with a mean of 396 umol/L.   Marland Kitchen Cholesterol 09/11/2015 133  0 - 200 mg/dL Final   ATP III Classification       Desirable:  < 200  mg/dL               Borderline High:  200 - 239 mg/dL          High:  > = 240 mg/dL  . Triglycerides 09/11/2015 84.0  0.0 - 149.0 mg/dL Final   Normal:  <150 mg/dLBorderline High:  150 - 199 mg/dL  . HDL 09/11/2015 43.00  >39.00 mg/dL Final  . VLDL 09/11/2015 16.8  0.0 - 40.0 mg/dL Final  . LDL Cholesterol 09/11/2015 73  0 - 99 mg/dL Final  . Total CHOL/HDL Ratio 09/11/2015 3   Final                  Men          Women1/2 Average Risk     3.4          3.3Average Risk          5.0          4.42X Average Risk          9.6          7.13X Average Risk          15.0          11.0                      . NonHDL 09/11/2015 90.14   Final   NOTE:  Non-HDL goal should be 30 mg/dL higher than patient's LDL goal (i.e. LDL goal of < 70 mg/dL, would have non-HDL goal of < 100 mg/dL)       Medication List       This list is accurate as of: 09/16/15  1:27 PM.  Always use your most recent med list.               amLODipine 10 MG tablet  Commonly known as:  NORVASC  Take 1 tablet (10 mg total) by mouth daily.     aspirin 81 MG EC tablet  Take 1 tablet (81 mg total) by mouth daily.     carvedilol 12.5 MG tablet  Commonly known as:  COREG  Take 1 tablet (12.5 mg total) by mouth 2 (two) times daily with a meal.     Dulaglutide 1.5 MG/0.5ML Sopn  Commonly known  as:  TRULICITY  Inject the contents of one pen once per week, taper off insulin.     FERRO-PLEX HEMATINIC 115-1 MG Tabs  Take by mouth.     fluticasone 50 MCG/ACT nasal spray  Commonly known as:  FLONASE  Place 1 spray into both nostrils as needed.     furosemide 20 MG tablet  Commonly known as:  LASIX  TAKE 1 TABLET BY MOUTH EVERY DAY AS NEEDED(WEIGHT GAIN 3 LBS IN A DAY OR 5 LBS IN A WEEK)     glucose blood test strip  Commonly known as:  ONE TOUCH ULTRA TEST  Use to check blood sugar 3 times daily Dx code E11.65     insulin aspart 100 UNIT/ML injection  Commonly known as:  novoLOG  Inject 3-12 Units into the skin 4 (four)  times daily -  before meals and at bedtime. Reported on 09/16/2015     Insulin Glargine 100 UNIT/ML Solostar Pen  Commonly known as:  LANTUS SOLOSTAR  Inject 50 units into the skin every morning, adjust according to sliding scale     Insulin Pen Needle 32G X 4 MM Misc  Commonly known as:  BD PEN NEEDLE NANO U/F  Use 4 per day     isosorbide-hydrALAZINE 20-37.5 MG tablet  Commonly known as:  BIDIL  Take 2 tablets by mouth 2 (two) times daily.     lisinopril 40 MG tablet  Commonly known as:  PRINIVIL,ZESTRIL  Take 40 mg by mouth daily.     nitroGLYCERIN 0.4 MG SL tablet  Commonly known as:  NITROSTAT  Place 1 tablet (0.4 mg total) under the tongue every 5 (five) minutes as needed for chest pain.     oxymetazoline 0.05 % nasal spray  Commonly known as:  AFRIN  Place 1 spray into both nostrils daily as needed for congestion.     PROAIR HFA 108 (90 Base) MCG/ACT inhaler  Generic drug:  albuterol     simvastatin 40 MG tablet  Commonly known as:  ZOCOR     sodium chloride 0.65 % Soln nasal spray  Commonly known as:  OCEAN  Place 1 spray into both nostrils daily as needed for congestion.     Vitamin B-12 500 MCG Subl  Place under the tongue.     Vitamin D (Ergocalciferol) 50000 units Caps capsule  Commonly known as:  DRISDOL  Take 50,000 Units by mouth every 7 (seven) days.        Allergies: No Known Allergies  Past Medical History  Diagnosis Date  . Diabetes mellitus without complication ([place])   . Hypertension   . History of ovarian cancer   . Obesity (BMI 30-39.9)   . Chronic combined systolic and diastolic CHF (congestive heart failure) ([place])   . NICM (nonischemic cardiomyopathy) ([place])     a. Echocardiogram (09/08/2013): Mild LVH, EF 30-35%, no WMA, mild LAE.  Marland Kitchen CAD (coronary artery disease)     non-obstructive by LHC (09/25/2013): Proximal and mid LAD serial 20%, proximal circumflex 30%, mid AV groove circumflex 30%, mid RCA mild plaque.  . Carotid stenosis     a.  Carotid US (09/26/2011): Bilateral 1-39% ICA.  Marland Kitchen CKD (chronic kidney disease)   . Hyperlipidemia     Past Surgical History  Procedure Laterality Date  . Abdominal hysterectomy    . Left heart catheterization with coronary angiogram N/A 09/25/2013    Procedure: LEFT HEART CATHETERIZATION WITH CORONARY ANGIOGRAM;  Surgeon: Burnell Blanks, MD;  Location: [facility]  CATH LAB;  Service: Cardiovascular;  Laterality: N/A;    Family History  Problem Relation Age of Onset  . Diabetes Father     Social History:  reports that she has never smoked. She has never used smokeless tobacco. She reports that she does not drink alcohol or use illicit drugs.    Review of Systems   Stomach burning : This occurs around 3 AM better witSitting up for 5 minutes   Thyroid: She has a 2.8 cm mixed echogenic predominantly solid nodule on the right side and a biopsy was benign       Lipids:  has been treated with Lipitor by PCP with good control         Lab Results  Component Value Date   CHOL 133 09/11/2015   HDL 43.00 09/11/2015   LDLCALC 73 09/11/2015   TRIG 84.0 09/11/2015   CHOLHDL 3 09/11/2015                   The blood pressure has been treated with carvedilol, amlodipine and lisinopril .   Is also on hydralazine/isosorbide for history of CHF.    She has been followed by cardiologist and nephrologist      LABS:  Appointment on 09/11/2015  Component Date Value Ref Range Status  . Sodium 09/11/2015 141  135 - 145 mEq/L Final  . Potassium 09/11/2015 4.4  3.5 - 5.1 mEq/L Final  . Chloride 09/11/2015 107  96 - 112 mEq/L Final  . CO2 09/11/2015 26  19 - 32 mEq/L Final  . Glucose, Bld 09/11/2015 166* 70 - 99 mg/dL Final  . BUN 09/11/2015 27* 6 - 23 mg/dL Final  . Creatinine, Ser 09/11/2015 1.65* 0.40 - 1.20 mg/dL Final  . Total Bilirubin 09/11/2015 0.4  0.2 - 1.2 mg/dL Final  . Alkaline Phosphatase 09/11/2015 38* 39 - 117 U/L Final  . AST 09/11/2015 13  0 - 37 U/L Final  . ALT 09/11/2015  9  0 - 35 U/L Final  . Total Protein 09/11/2015 6.8  6.0 - 8.3 g/dL Final  . Albumin 09/11/2015 3.8  3.5 - 5.2 g/dL Final  . Calcium 09/11/2015 8.9  8.4 - 10.5 mg/dL Final  . GFR 09/11/2015 40.32* >60.00 mL/min Final  . Fructosamine 09/11/2015 251  0 - 285 umol/L Final   Comment: Published reference interval for apparently healthy subjects between age 66 and 79 is 72 - 285 umol/L and in a poorly controlled diabetic population is 228 - 563 umol/L with a mean of 396 umol/L.   Marland Kitchen Cholesterol 09/11/2015 133  0 - 200 mg/dL Final   ATP III Classification       Desirable:  < 200 mg/dL               Borderline High:  200 - 239 mg/dL          High:  > = 240 mg/dL  . Triglycerides 09/11/2015 84.0  0.0 - 149.0 mg/dL Final   Normal:  <150 mg/dLBorderline High:  150 - 199 mg/dL  . HDL 09/11/2015 43.00  >39.00 mg/dL Final  . VLDL 09/11/2015 16.8  0.0 - 40.0 mg/dL Final  . LDL Cholesterol 09/11/2015 73  0 - 99 mg/dL Final  . Total CHOL/HDL Ratio 09/11/2015 3   Final                  Men          Women1/2 Average Risk     3.4  3.3Average Risk          5.0          4.42X Average Risk          9.6          7.13X Average Risk          15.0          11.0                      . NonHDL 09/11/2015 90.14   Final   NOTE:  Non-HDL goal should be 30 mg/dL higher than patient's LDL goal (i.e. LDL goal of < 70 mg/dL, would have non-HDL goal of < 100 mg/dL)    Physical Examination:  BP 160/92 mmHg  Pulse 81  Temp(Src) 98.9 F (37.2 C)  Resp 16  Ht 5\' 7"  (1.702 m)  Wt 207 lb 6.4 oz (94.076 kg)  BMI 32.48 kg/m2  SpO2 97%   ASSESSMENT:  Diabetes type 2, uncontrolled with obesity and BMI of 33 Her blood sugars are Overall well controlled with Trulicity 1.5 mg   She has been able to get off insulin completely   Although she thinks her fasting readings are higher than before there are reportedly only about 120 and her fructosamine is minimally increased at 251  She also has minimal increase in  postprandial readings but did not bring her monitor for download  HYPERTENSION: Blood pressure is relatively high today , discussed need to follow-up with cardiologist and PCP and also avoid fast food and other sodium  rich items  PLAN:     Trial of metformin ER starting with 500 mg and increasing to 1000 mg after 1 week if tolerated.  Discussed that this has been approved for use in stage III kidney disease and does have long-term cardiovascular benefits as well as potentially improve her weight   Bring monitor for download   continue Trulicity  May take OTC Zantac or Pepcid before supper to help overnight heartburn   Avir Deruiter 09/16/2015, 1:27 PM   Note: This office note was prepared with Estate agent. Any transcriptional errors that result from this process are unintentional.

## 2015-09-25 ENCOUNTER — Other Ambulatory Visit: Payer: BC Managed Care – PPO

## 2015-10-23 ENCOUNTER — Other Ambulatory Visit: Payer: BC Managed Care – PPO

## 2015-10-28 ENCOUNTER — Ambulatory Visit: Payer: BC Managed Care – PPO | Admitting: Endocrinology

## 2016-03-12 ENCOUNTER — Other Ambulatory Visit: Payer: Self-pay | Admitting: Endocrinology

## 2016-03-17 LAB — BASIC METABOLIC PANEL: GLUCOSE: 151 mg/dL

## 2016-03-17 LAB — HEMOGLOBIN A1C: HEMOGLOBIN A1C: 6.9

## 2016-04-10 ENCOUNTER — Encounter: Payer: Self-pay | Admitting: Endocrinology

## 2016-04-10 ENCOUNTER — Other Ambulatory Visit: Payer: Self-pay

## 2016-04-10 ENCOUNTER — Telehealth: Payer: Self-pay | Admitting: Endocrinology

## 2016-04-10 ENCOUNTER — Ambulatory Visit (INDEPENDENT_AMBULATORY_CARE_PROVIDER_SITE_OTHER): Payer: BC Managed Care – PPO | Admitting: Endocrinology

## 2016-04-10 VITALS — BP 150/90 | HR 75 | Ht 66.0 in | Wt 200.0 lb

## 2016-04-10 DIAGNOSIS — E1165 Type 2 diabetes mellitus with hyperglycemia: Secondary | ICD-10-CM | POA: Diagnosis not present

## 2016-04-10 MED ORDER — METFORMIN HCL ER 500 MG PO TB24: ORAL_TABLET | ORAL | 4 refills | Status: DC

## 2016-04-10 MED ORDER — DULAGLUTIDE 1.5 MG/0.5ML ~~LOC~~ SOAJ: SUBCUTANEOUS | 3 refills | Status: DC

## 2016-04-10 MED ORDER — GLUCOSE BLOOD VI STRP: ORAL_STRIP | 5 refills | Status: DC

## 2016-04-10 NOTE — Progress Notes (Signed)
Patient ID: Teresa Poole, female   DOB: [birth year], 65 y.o.   MRN: [id number]           Reason for Appointment: Follow-up for Type 2 Diabetes  Referring physician: Fulp  History of Present Illness:          Diagnosis: Type 2 diabetes mellitus, date of diagnosis: 2000        Past history: At diagnosis she was having fatigue and she was initially treated with metformin Metformin was stopped about 1-1/2 years ago when she decided not to take any medications. She was in the hospital in 08/2013 and her blood sugar was about 460 with A1c of 15. She was started on Lantus and NovoLog and has been continued on this regimen since then. Has not been back on any oral hypoglycemic drugs in the past year  Recent history:    On her initial consultation she was started on Trulicity to help with her postprandial readings and she was able to stop insulin with this   She did not bring her monitor for download again and has not been seen in follow-up since January  Current management, blood sugar patterns and problems identified:  Her A1c recently was 6.9, has been as low as 5.9 previously  Fasting blood sugar today is 160 in the office  She thinks that she had lost weight but recently because of poor compliance with diet and not wanting to exercise her weight has gone up.  She was previously trying to eat more vegetables and salads and now is eating out frequently  She has not had a glucose monitor for the last month  She is taking the metformin that was started in 1/70  However she said that sometimes she feels heartburn or nausea with metformin which she takes at night with a candy bar       Oral hypoglycemic drugs the patient is taking are: None      Side effects from medications have been: None Compliance with the medical regimen: Fair  Glucose monitoring:  not done recently , previous readings by recall:   Glucometer: One Touch.    PRE-MEAL Fasting pc Lunch Dinner Bedtime Overall    Glucose range: 120-125 130      Mean/median:           Self-care:     Meals: 3 meals per day. Breakfast is meat, egg, potatoes and sometimes oatmeal/fruit.  Does avoid fried food  but recently eating out more, some sweets         Exercise: walking at work       Dietician visit, most recent: never.               Weight history: 205-245  Wt Readings from Last 3 Encounters:  04/10/16 200 lb (90.7 kg)  09/16/15 207 lb 6.4 oz (94.1 kg)  08/05/15 207 lb 9.6 oz (94.2 kg)    Glycemic control:   A1c on 09/28/14 = 6.4  Lab Results  Component Value Date   HGBA1C 5.9 08/05/2015   HGBA1C 6.7 (H) 12/20/2014   HGBA1C 15.0 (H) 09/07/2013   Lab Results  Component Value Date   LDLCALC 73 09/11/2015   CREATININE 1.65 (H) 09/11/2015   Microalbumin creatinine ratio on 09/28/14 = 3730 and creatinine clearance 46 with creatinine level I.4  No visits with results within 1 Week(s) from this visit.  Latest known visit with results is:  Appointment on 09/11/2015  Component Date Value Ref Range Status  .  Sodium 09/11/2015 141  135 - 145 mEq/L Final  . Potassium 09/11/2015 4.4  3.5 - 5.1 mEq/L Final  . Chloride 09/11/2015 107  96 - 112 mEq/L Final  . CO2 09/11/2015 26  19 - 32 mEq/L Final  . Glucose, Bld 09/11/2015 166* 70 - 99 mg/dL Final  . BUN 09/11/2015 27* 6 - 23 mg/dL Final  . Creatinine, Ser 09/11/2015 1.65* 0.40 - 1.20 mg/dL Final  . Total Bilirubin 09/11/2015 0.4  0.2 - 1.2 mg/dL Final  . Alkaline Phosphatase 09/11/2015 38* 39 - 117 U/L Final  . AST 09/11/2015 13  0 - 37 U/L Final  . ALT 09/11/2015 9  0 - 35 U/L Final  . Total Protein 09/11/2015 6.8  6.0 - 8.3 g/dL Final  . Albumin 09/11/2015 3.8  3.5 - 5.2 g/dL Final  . Calcium 09/11/2015 8.9  8.4 - 10.5 mg/dL Final  . GFR 09/11/2015 40.32* >60.00 mL/min Final  . Fructosamine 09/12/2015 251  0 - 285 umol/L Final   Comment: Published reference interval for apparently healthy subjects between age 69 and 50 is 56 - 285 umol/L and in  a poorly controlled diabetic population is 228 - 563 umol/L with a mean of 396 umol/L.   Marland Kitchen Cholesterol 09/11/2015 133  0 - 200 mg/dL Final  . Triglycerides 09/11/2015 84.0  0.0 - 149.0 mg/dL Final  . HDL 09/11/2015 43.00  >39.00 mg/dL Final  . VLDL 09/11/2015 16.8  0.0 - 40.0 mg/dL Final  . LDL Cholesterol 09/11/2015 73  0 - 99 mg/dL Final  . Total CHOL/HDL Ratio 09/11/2015 3   Final  . NonHDL 09/11/2015 90.14   Final       Medication List       Accurate as of 04/10/16  9:30 AM. Always use your most recent med list.          amLODipine 10 MG tablet Commonly known as:  NORVASC Take 1 tablet (10 mg total) by mouth daily.   aspirin 81 MG EC tablet Take 1 tablet (81 mg total) by mouth daily.   carvedilol 12.5 MG tablet Commonly known as:  COREG Take 1 tablet (12.5 mg total) by mouth 2 (two) times daily with a meal.   Dulaglutide 1.5 MG/0.5ML Sopn Commonly known as:  TRULICITY Inject the contents of one pen once per week, taper off insulin.   FERRO-PLEX HEMATINIC 115-1 MG Tabs Take by mouth.   fluticasone 50 MCG/ACT nasal spray Commonly known as:  FLONASE Place 1 spray into both nostrils as needed.   furosemide 20 MG tablet Commonly known as:  LASIX TAKE 1 TABLET BY MOUTH EVERY DAY AS NEEDED(WEIGHT GAIN 3 LBS IN A DAY OR 5 LBS IN A WEEK)   glucose blood test strip Commonly known as:  ONE TOUCH ULTRA TEST Use to check blood sugar 3 times daily Dx code E11.65   insulin aspart 100 UNIT/ML injection Commonly known as:  novoLOG Inject 3-12 Units into the skin 4 (four) times daily -  before meals and at bedtime. Reported on 09/16/2015   Insulin Pen Needle 32G X 4 MM Misc Commonly known as:  BD PEN NEEDLE NANO U/F Use 4 per day   isosorbide-hydrALAZINE 20-37.5 MG tablet Commonly known as:  BIDIL Take 2 tablets by mouth 2 (two) times daily.   lisinopril 40 MG tablet Commonly known as:  PRINIVIL,ZESTRIL Take 40 mg by mouth daily.   metFORMIN 500 MG 24 hr  tablet Commonly known as:  GLUCOPHAGE-XR TAKE 2 TABLETS(1000  MG) BY MOUTH DAILY WITH SUPPER   nitroGLYCERIN 0.4 MG SL tablet Commonly known as:  NITROSTAT Place 1 tablet (0.4 mg total) under the tongue every 5 (five) minutes as needed for chest pain.   oxymetazoline 0.05 % nasal spray Commonly known as:  AFRIN Place 1 spray into both nostrils daily as needed for congestion.   PROAIR HFA 108 (90 Base) MCG/ACT inhaler Generic drug:  albuterol   simvastatin 40 MG tablet Commonly known as:  ZOCOR   sodium chloride 0.65 % Soln nasal spray Commonly known as:  OCEAN Place 1 spray into both nostrils daily as needed for congestion.   Vitamin B-12 500 MCG Subl Place under the tongue.   Vitamin D (Ergocalciferol) 50000 units Caps capsule Commonly known as:  DRISDOL Take 50,000 Units by mouth every 7 (seven) days.       Allergies: No Known Allergies  Past Medical History:  Diagnosis Date  . CAD (coronary artery disease)    non-obstructive by LHC (09/25/2013): Proximal and mid LAD serial 20%, proximal circumflex 30%, mid AV groove circumflex 30%, mid RCA mild plaque.  . Carotid stenosis    a. Carotid US (09/26/2011): Bilateral 1-39% ICA.  Marland Kitchen Chronic combined systolic and diastolic CHF (congestive heart failure) ([place])   . CKD (chronic kidney disease)   . Diabetes mellitus without complication ([place])   . History of ovarian cancer   . Hyperlipidemia   . Hypertension   . NICM (nonischemic cardiomyopathy) ([place])    a. Echocardiogram (09/08/2013): Mild LVH, EF 30-35%, no WMA, mild LAE.  Marland Kitchen Obesity (BMI 30-39.9)     Past Surgical History:  Procedure Laterality Date  . ABDOMINAL HYSTERECTOMY    . LEFT HEART CATHETERIZATION WITH CORONARY ANGIOGRAM N/A 09/25/2013   Procedure: LEFT HEART CATHETERIZATION WITH CORONARY ANGIOGRAM;  Surgeon: Burnell Blanks, MD;  Location: [facility] CATH LAB;  Service: Cardiovascular;  Laterality: N/A;    Family History  Problem Relation Age of Onset  . Diabetes  Father     Social History:  reports that she has never smoked. She has never used smokeless tobacco. She reports that she does not drink alcohol or use drugs.    Review of Systems     Thyroid: She has a 2.8 cm mixed echogenic predominantly solid nodule on the right side and a biopsy was benign        Lipids:  has been treated with Lipitor by PCP with good control         Lab Results  Component Value Date   CHOL 133 09/11/2015   HDL 43.00 09/11/2015   LDLCALC 73 09/11/2015   TRIG 84.0 09/11/2015   CHOLHDL 3 09/11/2015                   The blood pressure has been treated with carvedilol, amlodipine and lisinopril .   Is also on hydralazine/isosorbide for history of CHF.    She has been followed by cardiologist and nephrologist       LABS:  No visits with results within 1 Week(s) from this visit.  Latest known visit with results is:  Appointment on 09/11/2015  Component Date Value Ref Range Status  . Sodium 09/11/2015 141  135 - 145 mEq/L Final  . Potassium 09/11/2015 4.4  3.5 - 5.1 mEq/L Final  . Chloride 09/11/2015 107  96 - 112 mEq/L Final  . CO2 09/11/2015 26  19 - 32 mEq/L Final  . Glucose, Bld 09/11/2015 166* 70 - 99 mg/dL  Final  . BUN 09/11/2015 27* 6 - 23 mg/dL Final  . Creatinine, Ser 09/11/2015 1.65* 0.40 - 1.20 mg/dL Final  . Total Bilirubin 09/11/2015 0.4  0.2 - 1.2 mg/dL Final  . Alkaline Phosphatase 09/11/2015 38* 39 - 117 U/L Final  . AST 09/11/2015 13  0 - 37 U/L Final  . ALT 09/11/2015 9  0 - 35 U/L Final  . Total Protein 09/11/2015 6.8  6.0 - 8.3 g/dL Final  . Albumin 09/11/2015 3.8  3.5 - 5.2 g/dL Final  . Calcium 09/11/2015 8.9  8.4 - 10.5 mg/dL Final  . GFR 09/11/2015 40.32* >60.00 mL/min Final  . Fructosamine 09/12/2015 251  0 - 285 umol/L Final   Comment: Published reference interval for apparently healthy subjects between age 87 and 12 is 94 - 285 umol/L and in a poorly controlled diabetic population is 228 - 563 umol/L with a mean of  396 umol/L.   Marland Kitchen Cholesterol 09/11/2015 133  0 - 200 mg/dL Final  . Triglycerides 09/11/2015 84.0  0.0 - 149.0 mg/dL Final  . HDL 09/11/2015 43.00  >39.00 mg/dL Final  . VLDL 09/11/2015 16.8  0.0 - 40.0 mg/dL Final  . LDL Cholesterol 09/11/2015 73  0 - 99 mg/dL Final  . Total CHOL/HDL Ratio 09/11/2015 3   Final  . NonHDL 09/11/2015 90.14   Final    Physical Examination:  BP (!) 150/90   Pulse 75   Ht 5\' 6"  (1.676 m)   Wt 200 lb (90.7 kg)   SpO2 98%   BMI 32.28 kg/m    ASSESSMENT:  Diabetes type 2, uncontrolled with obesity and BMI of 33 Her blood sugars areDifficult to assess as she has not monitored at home Although her A1c is reasonably alert 6.9 last month it is higher than her best reading and her fasting glucose is 160 today  Not clear if she has benefited from adding metformin to Trulicity and is taking only 1000 milligrams. Recently has been poorly compliant with diet and exercise despite taking Trulicity She is making wrong choices and eating out a lot and does not appear motivated  HYPERTENSION: Blood pressure is relatively high today and she will need to follow-up with her treating physicians She may also do better with weight loss and avoiding fast food and other rich foods  PLAN:   Start using One Touch Verio meter Discussed checking blood sugars alternating fasting and postprandial Restart exercise Improve diet Will need short-term follow-up to make sure she is back on track  Patient Instructions  Check blood sugars on waking up  2-3x per week  Also check blood sugars about 2 hours after a meal and do this after different meals by rotation  Recommended blood sugar levels on waking up is 90-130 and about 2 hours after meal is 130-160  Please bring your blood sugar monitor to each visit, thank you  Restart healthy diet  Restart bike  Metformin with meals      Wrenna Saks 04/10/2016, 9:30 AM   Note: This office note was prepared with Theatre stage manager. Any transcriptional errors that result from this process are unintentional.

## 2016-04-10 NOTE — Telephone Encounter (Signed)
Rx submitted per pt's request.  

## 2016-04-10 NOTE — Telephone Encounter (Signed)
PT wanted to make sure that her Trulicity and Metformin get sent into the pharmacy.

## 2016-04-10 NOTE — Addendum Note (Signed)
Addended by: Nile Riggs on: 04/10/2016 01:15 PM   Modules accepted: Orders

## 2016-04-10 NOTE — Patient Instructions (Signed)
Check blood sugars on waking up  2-3x per week  Also check blood sugars about 2 hours after a meal and do this after different meals by rotation  Recommended blood sugar levels on waking up is 90-130 and about 2 hours after meal is 130-160  Please bring your blood sugar monitor to each visit, thank you  Restart healthy diet  Restart bike  Metformin with meals

## 2016-04-12 ENCOUNTER — Other Ambulatory Visit: Payer: Self-pay | Admitting: Endocrinology

## 2016-04-28 ENCOUNTER — Telehealth: Payer: Self-pay | Admitting: Endocrinology

## 2016-04-28 NOTE — Telephone Encounter (Signed)
Patient wants to know if she is suppose to take the Trulicity [id number] or the 1.5 mg, she said you increased her metformin to 2 tablets daily and didn't know if she was suppose to stop Trulicity.  Please advise

## 2016-04-28 NOTE — Telephone Encounter (Signed)
Pt has questions about trulicity

## 2016-04-29 ENCOUNTER — Other Ambulatory Visit: Payer: Self-pay | Admitting: *Deleted

## 2016-04-29 MED ORDER — DULAGLUTIDE 1.5 MG/0.5ML ~~LOC~~ SOAJ: SUBCUTANEOUS | 3 refills | Status: DC

## 2016-04-29 NOTE — Telephone Encounter (Signed)
1.5 Trulicity

## 2016-04-30 ENCOUNTER — Encounter: Payer: Self-pay | Admitting: *Deleted

## 2016-05-15 ENCOUNTER — Telehealth: Payer: Self-pay | Admitting: Endocrinology

## 2016-05-15 NOTE — Telephone Encounter (Signed)
Pt called in and was confused because she didn't think she was supposed to be taking the 1.5 mg of Trulicity.  Per Dr. Ronnie Derby last note on 04/29/16 he said 1.5 mg, however Pt was still confused because she said she was already getting very low blood sugars on the 0.75mg .  Requests call back to discuss.

## 2016-05-15 NOTE — Telephone Encounter (Signed)
I spoke with Mrs. Teresa Poole and reminded her that she was suppose to be on the 1.5 mg, she was confused about what dose she was suppose to be on.  I called the pharmacy and spoke with a tech to let them know she is suppose to be on the 1.5 mg

## 2016-06-08 ENCOUNTER — Other Ambulatory Visit (INDEPENDENT_AMBULATORY_CARE_PROVIDER_SITE_OTHER): Payer: BC Managed Care – PPO

## 2016-06-08 DIAGNOSIS — E1165 Type 2 diabetes mellitus with hyperglycemia: Secondary | ICD-10-CM | POA: Diagnosis not present

## 2016-06-08 LAB — BASIC METABOLIC PANEL
BUN: 23 mg/dL (ref 6–23)
CALCIUM: 9 mg/dL (ref 8.4–10.5)
CHLORIDE: 106 meq/L (ref 96–112)
CO2: 25 meq/L (ref 19–32)
Creatinine, Ser: 1.42 mg/dL — ABNORMAL HIGH (ref 0.40–1.20)
GFR: 47.83 mL/min — ABNORMAL LOW (ref >60.00)
Glucose, Bld: 145 mg/dL — ABNORMAL HIGH (ref 70–99)
Potassium: 4.1 mEq/L (ref 3.5–5.1)
SODIUM: 141 meq/L (ref 135–145)

## 2016-06-08 LAB — HEMOGLOBIN A1C: HEMOGLOBIN A1C: 7.1 % — AB (ref 4.6–6.5)

## 2016-06-11 ENCOUNTER — Encounter: Payer: Self-pay | Admitting: Endocrinology

## 2016-06-11 ENCOUNTER — Ambulatory Visit (INDEPENDENT_AMBULATORY_CARE_PROVIDER_SITE_OTHER): Payer: BC Managed Care – PPO | Admitting: Endocrinology

## 2016-06-11 VITALS — BP 126/86 | HR 80 | Temp 97.8°F | Resp 16 | Ht 66.0 in | Wt 200.8 lb

## 2016-06-11 DIAGNOSIS — Z23 Encounter for immunization: Secondary | ICD-10-CM

## 2016-06-11 DIAGNOSIS — E1165 Type 2 diabetes mellitus with hyperglycemia: Secondary | ICD-10-CM | POA: Diagnosis not present

## 2016-06-11 NOTE — Patient Instructions (Addendum)
Check blood sugars on waking up  2x weekly  Also check blood sugars about 2 hours after a meal and do this after different meals by rotation including dinner  Recommended blood sugar levels on waking up is 90-130 and about 2 hours after meal is 130-160  Please bring your blood sugar monitor to each visit, thank you  Walk 20 min on weekends  Stay on 1.5mg  weekly

## 2016-06-11 NOTE — Progress Notes (Signed)
Patient ID: Teresa Poole, female   DOB: [birth year], 65 y.o.   MRN: [id number]           Reason for Appointment: Follow-up for Type 2 Diabetes  Referring physician: Fulp  History of Present Illness:          Diagnosis: Type 2 diabetes mellitus, date of diagnosis: 2000        Past history: At diagnosis she was having fatigue and she was initially treated with metformin Metformin was stopped about 1-1/2 years ago when she decided not to take any medications. She was in the hospital in 08/2013 and her blood sugar was about 460 with A1c of 15. She was started on Lantus and NovoLog and has been continued on this regimen since then. Has not been back on any oral hypoglycemic drugs in the past year  Recent history:   Non-insulin hypoglycemic drugs the patient is taking are: Metformin 1g, Trulicity 1.5mg  weekly  On her initial consultation she was started on Trulicity to help with her postprandial readings and she was able to stop insulin with this This was increased in 1/17 up to 1.5 mg  Her A1c is relatively higher at 7.1, has been as low as 5.9   She did not bring her monitor for download again   Current management, blood sugar patterns and problems identified:  Not clear what her blood sugars at home and when she is checking them  A1c is higher than expected for her reported blood sugar range of 120-140  Does not check readings after meals especially supper  She was told to continue 1.5 Trulicity but apparently she got a refill for 0.75 until last month   She thinks she is fairly active going up and down stairs at work and does not do any formal exercise  Also she is not able to watch her diet and control portions            Side effects from medications have been: None Compliance with the medical regimen: Fair  Glucose monitoring:  by recall:  Glucometer: One Touch.    PRE-MEAL Fasting pc Lunch Dinner Bedtime Overall  Glucose range:  120-135  135-140        Mean/median:           Self-care:     Meals: 3 meals per day. Breakfast is meat, egg, potatoes and sometimes oatmeal/fruit.  Does avoid fried food but  eating out more, some sweets         Exercise: walking at work       Dietician visit, most recent: 3/16.               Weight history: Range 205-245  Wt Readings from Last 3 Encounters:  06/11/16 200 lb 12.8 oz (91.1 kg)  04/10/16 200 lb (90.7 kg)  09/16/15 207 lb 6.4 oz (94.1 kg)    Glycemic control:   A1c on 09/28/14 = 6.4  Lab Results  Component Value Date   HGBA1C 7.1 (H) 06/08/2016   HGBA1C 6.9 03/17/2016   HGBA1C 5.9 08/05/2015   Lab Results  Component Value Date   LDLCALC 73 09/11/2015   CREATININE 1.42 (H) 06/08/2016   Microalbumin creatinine ratio on 09/28/14 = 3730 and creatinine clearance 46 with creatinine level I.4  Lab on 06/08/2016  Component Date Value Ref Range Status  . Hgb A1c MFr Bld 06/08/2016 7.1* 4.6 - 6.5 % Final  . Sodium 06/08/2016 141  135 - 145 mEq/L Final  .  Potassium 06/08/2016 4.1  3.5 - 5.1 mEq/L Final  . Chloride 06/08/2016 106  96 - 112 mEq/L Final  . CO2 06/08/2016 25  19 - 32 mEq/L Final  . Glucose, Bld 06/08/2016 145* 70 - 99 mg/dL Final  . BUN 06/08/2016 23  6 - 23 mg/dL Final  . Creatinine, Ser 06/08/2016 1.42* 0.40 - 1.20 mg/dL Final  . Calcium 06/08/2016 9.0  8.4 - 10.5 mg/dL Final  . GFR 06/08/2016 47.83* >60.00 mL/min Final       Medication List       Accurate as of 06/11/16  4:21 PM. Always use your most recent med list.          amLODipine 10 MG tablet Commonly known as:  NORVASC Take 1 tablet (10 mg total) by mouth daily.   aspirin 81 MG EC tablet Take 1 tablet (81 mg total) by mouth daily.   carvedilol 12.5 MG tablet Commonly known as:  COREG Take 1 tablet (12.5 mg total) by mouth 2 (two) times daily with a meal.   Dulaglutide 1.5 MG/0.5ML Sopn Commonly known as:  TRULICITY Inject the contents of one pen once per week, taper off insulin.   FERRO-PLEX  HEMATINIC 115-1 MG Tabs Take by mouth.   fluticasone 50 MCG/ACT nasal spray Commonly known as:  FLONASE Place 1 spray into both nostrils as needed.   furosemide 20 MG tablet Commonly known as:  LASIX TAKE 1 TABLET BY MOUTH EVERY DAY AS NEEDED(WEIGHT GAIN 3 LBS IN A DAY OR 5 LBS IN A WEEK)   glucose blood test strip Commonly known as:  ONE TOUCH ULTRA TEST Use to check blood sugar 3 times daily Dx code E11.65   glucose blood test strip Commonly known as:  ONETOUCH VERIO Use to check blood sugar 3 times daily Dx code E11.65   Insulin Pen Needle 32G X 4 MM Misc Commonly known as:  BD PEN NEEDLE NANO U/F Use 4 per day   isosorbide-hydrALAZINE 20-37.5 MG tablet Commonly known as:  BIDIL Take 2 tablets by mouth 2 (two) times daily.   lisinopril 40 MG tablet Commonly known as:  PRINIVIL,ZESTRIL Take 40 mg by mouth daily.   metFORMIN 500 MG 24 hr tablet Commonly known as:  GLUCOPHAGE-XR TAKE 2 TABLETS(1000 MG) BY MOUTH DAILY WITH SUPPER   nitroGLYCERIN 0.4 MG SL tablet Commonly known as:  NITROSTAT Place 1 tablet (0.4 mg total) under the tongue every 5 (five) minutes as needed for chest pain.   oxymetazoline 0.05 % nasal spray Commonly known as:  AFRIN Place 1 spray into both nostrils daily as needed for congestion.   PROAIR HFA 108 (90 Base) MCG/ACT inhaler Generic drug:  albuterol   simvastatin 40 MG tablet Commonly known as:  ZOCOR   sodium chloride 0.65 % Soln nasal spray Commonly known as:  OCEAN Place 1 spray into both nostrils daily as needed for congestion.   Vitamin B-12 500 MCG Subl Place under the tongue.   Vitamin D (Ergocalciferol) 50000 units Caps capsule Commonly known as:  DRISDOL Take 50,000 Units by mouth every 7 (seven) days.       Allergies: No Known Allergies  Past Medical History:  Diagnosis Date  . CAD (coronary artery disease)    non-obstructive by LHC (09/25/2013): Proximal and mid LAD serial 20%, proximal circumflex 30%, mid AV  groove circumflex 30%, mid RCA mild plaque.  . Carotid stenosis    a. Carotid US (09/26/2011): Bilateral 1-39% ICA.  Marland Kitchen Chronic combined systolic  and diastolic CHF (congestive heart failure) ([place])   . CKD (chronic kidney disease)   . Diabetes mellitus without complication ([place])   . History of ovarian cancer   . Hyperlipidemia   . Hypertension   . NICM (nonischemic cardiomyopathy) ([place])    a. Echocardiogram (09/08/2013): Mild LVH, EF 30-35%, no WMA, mild LAE.  Marland Kitchen Obesity (BMI 30-39.9)     Past Surgical History:  Procedure Laterality Date  . ABDOMINAL HYSTERECTOMY    . LEFT HEART CATHETERIZATION WITH CORONARY ANGIOGRAM N/A 09/25/2013   Procedure: LEFT HEART CATHETERIZATION WITH CORONARY ANGIOGRAM;  Surgeon: Burnell Blanks, MD;  Location[facility] C[facility] LAB;  Service: Cardiovascular;  Laterality: N/A;    Family History  Problem Relation Age of Onset  . Diabetes Father     Social History:  reports that she has never smoked. She has never used smokeless tobacco. She reports that she does not drink alcohol or use drugs.    Review of Systems     Thyroid: She has a 2.8 cm mixed echogenic predominantly solid nodule on the right side and a biopsy was benign        Lipids:  has been treated with Lipitor by PCP with good control  LDL 86 in 7/17       Lab Results  Component Value Date   CHOL 133 09/11/2015   HDL 43.00 09/11/2015   LDLCALC 73 09/11/2015   TRIG 84.0 09/11/2015   CHOLHDL 3 09/11/2015                   The blood pressure has been treated with carvedilol, amlodipine and lisinopril .   Is also on hydralazine/isosorbide for history of CHF.    She has been followed by cardiologist and nephrologist       LABS:  Lab on 06/08/2016  Component Date Value Ref Range Status  . Hgb A1c MFr Bld 06/08/2016 7.1* 4.6 - 6.5 % Final  . Sodium 06/08/2016 141  135 - 145 mEq/L Final  . Potassium 06/08/2016 4.1  3.5 - 5.1 mEq/L Final  . Chloride 06/08/2016 106  96 - 112 mEq/L Final    . CO2 06/08/2016 25  19 - 32 mEq/L Final  . Glucose, Bld 06/08/2016 145* 70 - 99 mg/dL Final  . BUN 06/08/2016 23  6 - 23 mg/dL Final  . Creatinine, Ser 06/08/2016 1.42* 0.40 - 1.20 mg/dL Final  . Calcium 06/08/2016 9.0  8.4 - 10.5 mg/dL Final  . GFR 06/08/2016 47.83* >60.00 mL/min Final    Physical Examination:  BP 126/86   Pulse 80   Temp 97.8 F (36.6 C)   Resp 16   Ht 5\' 6"  (1.676 m)   Wt 200 lb 12.8 oz (91.1 kg)   SpO2 97%   BMI 32.41 kg/m    ASSESSMENT:  Diabetes type 2, uncontrolled with obesity and BMI of 33 Her blood sugars are not as good with A1c going up to 7.1 and has been better before However she has taken only 7.42 Trulicity instead of 1.5 and emphasized that she needs to continue the higher dose especially with her difficulty with watching her diet and portions She does not do readings after her evening meal and usually does not bring her monitor for download for assessment  HYPERTENSION: Blood pressure is fair and she needs to be followed by her other physicians  PLAN:  She will need to bring her monitor for download Discussed checking blood sugars alternating fasting and postprandial Try  to exercise at least on weekends Improve diet Increase Trulicity to 1.5 mg    Patient Instructions  Check blood sugars on waking up  2x weekly  Also check blood sugars about 2 hours after a meal and do this after different meals by rotation including dinner  Recommended blood sugar levels on waking up is 90-130 and about 2 hours after meal is 130-160  Please bring your blood sugar monitor to each visit, thank you  Walk 20 min on weekends  Stay on 1.5mg  weekly       Avika Carbine 06/11/2016, 4:21 PM   Note: This office note was prepared with Dragon voice recognition system technology. Any transcriptional errors that result from this process are unintentional.

## 2016-09-07 ENCOUNTER — Other Ambulatory Visit: Payer: BC Managed Care – PPO

## 2016-09-08 ENCOUNTER — Other Ambulatory Visit (INDEPENDENT_AMBULATORY_CARE_PROVIDER_SITE_OTHER): Payer: BC Managed Care – PPO

## 2016-09-08 DIAGNOSIS — E1165 Type 2 diabetes mellitus with hyperglycemia: Secondary | ICD-10-CM | POA: Diagnosis not present

## 2016-09-08 LAB — COMPREHENSIVE METABOLIC PANEL
ALBUMIN: 3.7 g/dL (ref 3.5–5.2)
ALK PHOS: 39 U/L (ref 39–117)
ALT: 13 U/L (ref 0–35)
AST: 16 U/L (ref 0–37)
BUN: 24 mg/dL — ABNORMAL HIGH (ref 6–23)
CALCIUM: 9.1 mg/dL (ref 8.4–10.5)
CO2: 25 mEq/L (ref 19–32)
Chloride: 111 mEq/L (ref 96–112)
Creatinine, Ser: 1.33 mg/dL — ABNORMAL HIGH (ref 0.40–1.20)
GFR: 51.54 mL/min — AB (ref >60.00)
Glucose, Bld: 113 mg/dL — ABNORMAL HIGH (ref 70–99)
POTASSIUM: 4.2 meq/L (ref 3.5–5.1)
Sodium: 143 mEq/L (ref 135–145)
Total Bilirubin: 0.3 mg/dL (ref 0.2–1.2)
Total Protein: 6.6 g/dL (ref 6.0–8.3)

## 2016-09-08 LAB — LIPID PANEL
CHOLESTEROL: 140 mg/dL (ref 0–200)
HDL: 46.2 mg/dL (ref >39.00)
LDL Cholesterol: 78 mg/dL (ref 0–99)
NonHDL: 93.5
TRIGLYCERIDES: 76 mg/dL (ref 0.0–149.0)
Total CHOL/HDL Ratio: 3
VLDL: 15.2 mg/dL (ref 0.0–40.0)

## 2016-09-08 LAB — HEMOGLOBIN A1C: Hgb A1c MFr Bld: 7.1 % — ABNORMAL HIGH (ref 4.6–6.5)

## 2016-09-08 LAB — MICROALBUMIN / CREATININE URINE RATIO
Creatinine,U: 175 mg/dL
MICROALB UR: 58.8 mg/dL — AB (ref 0.0–1.9)
Microalb Creat Ratio: 33.6 mg/g — ABNORMAL HIGH (ref 0.0–30.0)

## 2016-09-10 ENCOUNTER — Ambulatory Visit: Payer: BC Managed Care – PPO | Admitting: Endocrinology

## 2016-09-11 NOTE — Progress Notes (Signed)
Please reschedule canceled appointment

## 2016-09-17 ENCOUNTER — Ambulatory Visit (INDEPENDENT_AMBULATORY_CARE_PROVIDER_SITE_OTHER): Payer: BC Managed Care – PPO | Admitting: Endocrinology

## 2016-09-17 ENCOUNTER — Encounter: Payer: Self-pay | Admitting: Endocrinology

## 2016-09-17 VITALS — BP 135/72 | HR 87 | Ht 67.0 in | Wt 202.0 lb

## 2016-09-17 DIAGNOSIS — E1129 Type 2 diabetes mellitus with other diabetic kidney complication: Secondary | ICD-10-CM

## 2016-09-17 DIAGNOSIS — R809 Proteinuria, unspecified: Secondary | ICD-10-CM

## 2016-09-17 DIAGNOSIS — E1165 Type 2 diabetes mellitus with hyperglycemia: Secondary | ICD-10-CM

## 2016-09-17 MED ORDER — DULAGLUTIDE 0.75 MG/0.5ML ~~LOC~~ SOAJ: SUBCUTANEOUS | 2 refills | Status: DC

## 2016-09-17 MED ORDER — METFORMIN HCL ER 500 MG PO TB24: ORAL_TABLET | ORAL | 4 refills | Status: DC

## 2016-09-17 NOTE — Patient Instructions (Addendum)
Reduce Trulicity to 0.75mg  weekly  Take 1 Metformin in am and 2 in pm with food   Check blood sugars on waking up 2x per week  Also check blood sugars about 2 hours after a meal and do this after different meals by rotation  Recommended blood sugar levels on waking up is 90-130 and about 2 hours after meal is 130-160  Please bring your blood sugar monitor to each visit, thank you

## 2016-09-17 NOTE — Progress Notes (Signed)
Patient ID: Teresa Poole, female   DOB: 1950/12/15, 66 y.o.   MRN: [id number]           Reason for Appointment: Follow-up for Type 2 Diabetes  Referring physician: Fulp  History of Present Illness:          Diagnosis: Type 2 diabetes mellitus, date of diagnosis: 2000        Past history: At diagnosis she was having fatigue and she was initially treated with metformin Metformin was stopped about 1-1/2 years ago when she decided not to take any medications. She was in the hospital in 08/2013 and her blood sugar was about 460 with A1c of 15. She was started on Lantus and NovoLog and has been continued on this regimen since then. Has not been back on any oral hypoglycemic drugs in the past year  Recent history:   Non-insulin hypoglycemic drugs the patient is taking are: Metformin 1g, Trulicity 1.5mg  weekly  On her initial consultation she was started on Trulicity to help with her postprandial readings and she was able to stop insulin with this  Her A1c is still somewhat higher at 7.1, has been as low as 5.9   She did not bring her monitor for download again   Current management, blood sugar patterns and problems identified:  On her visit in 10/17 she had been taking 3.23 Trulicity instead of 1.5 in since she had not been able to lose weight or get A1c down the dose was increased.  Now she says that she is having a very significant decrease in her appetite and not able to eat much  Despite this however she has not lost any weight  She is trying to walk in or outside her building but again weight has leveled off  She thinks her blood sugars are no higher than  145 but not clear when or how often she is checking her sugars  She is somewhat afraid of low blood sugars and has not wanted to increase her metformin previously            Side effects from medications have been: None Compliance with the medical regimen: Fair  Glucose monitoring:  by recall: -145  Glucometer: One  Touch.    PRE-MEAL Fasting pc Lunch Dinner Bedtime Overall  Glucose range:  120-135  135-140      Mean/median:           Self-care:     Meals: 3 meals per day. Breakfast is meat, egg, potatoes and sometimes oatmeal/fruit.  Does avoid fried food but  eating out more, some sweets         Exercise: walking daily for several minutes       Dietician visit, most recent: 3/16.               Weight history: Range 205-245  Wt Readings from Last 3 Encounters:  09/17/16 202 lb (91.6 kg)  06/11/16 200 lb 12.8 oz (91.1 kg)  04/10/16 200 lb (90.7 kg)    Glycemic control:   A1c on 09/28/14 = 6.4  Lab Results  Component Value Date   HGBA1C 7.1 (H) 09/08/2016   HGBA1C 7.1 (H) 06/08/2016   HGBA1C 6.9 03/17/2016   Lab Results  Component Value Date   MICROALBUR 58.8 (H) 09/08/2016   LDLCALC 78 09/08/2016   CREATININE 1.33 (H) 09/08/2016   Microalbumin creatinine ratio on 09/28/14 = 3730 and creatinine clearance 46 with creatinine level I.4  No visits with results within  1 Week(s) from this visit.  Latest known visit with results is:  Lab on 09/08/2016  Component Date Value Ref Range Status  . Hgb A1c MFr Bld 09/08/2016 7.1* 4.6 - 6.5 % Final  . Sodium 09/08/2016 143  135 - 145 mEq/L Final  . Potassium 09/08/2016 4.2  3.5 - 5.1 mEq/L Final  . Chloride 09/08/2016 111  96 - 112 mEq/L Final  . CO2 09/08/2016 25  19 - 32 mEq/L Final  . Glucose, Bld 09/08/2016 113* 70 - 99 mg/dL Final  . BUN 09/08/2016 24* 6 - 23 mg/dL Final  . Creatinine, Ser 09/08/2016 1.33* 0.40 - 1.20 mg/dL Final  . Total Bilirubin 09/08/2016 0.3  0.2 - 1.2 mg/dL Final  . Alkaline Phosphatase 09/08/2016 39  39 - 117 U/L Final  . AST 09/08/2016 16  0 - 37 U/L Final  . ALT 09/08/2016 13  0 - 35 U/L Final  . Total Protein 09/08/2016 6.6  6.0 - 8.3 g/dL Final  . Albumin 09/08/2016 3.7  3.5 - 5.2 g/dL Final  . Calcium 09/08/2016 9.1  8.4 - 10.5 mg/dL Final  . GFR 09/08/2016 51.54* >60.00 mL/min Final  . Cholesterol  09/08/2016 140  0 - 200 mg/dL Final  . Triglycerides 09/08/2016 76.0  0.0 - 149.0 mg/dL Final  . HDL 09/08/2016 46.20  >39.00 mg/dL Final  . VLDL 09/08/2016 15.2  0.0 - 40.0 mg/dL Final  . LDL Cholesterol 09/08/2016 78  0 - 99 mg/dL Final  . Total CHOL/HDL Ratio 09/08/2016 3   Final  . NonHDL 09/08/2016 93.50   Final  . Microalb, Ur 09/08/2016 58.8* 0.0 - 1.9 mg/dL Final  . Creatinine,U 09/08/2016 175.0  mg/dL Final  . Microalb Creat Ratio 09/08/2016 33.6* 0.0 - 30.0 mg/g Final     Allergies as of 09/17/2016   No Known Allergies     Medication List       Accurate as of 09/17/16  9:02 PM. Always use your most recent med list.          amLODipine 10 MG tablet Commonly known as:  NORVASC Take 1 tablet (10 mg total) by mouth daily.   aspirin 81 MG EC tablet Take 1 tablet (81 mg total) by mouth daily.   carvedilol 12.5 MG tablet Commonly known as:  COREG Take 1 tablet (12.5 mg total) by mouth 2 (two) times daily with a meal.   Dulaglutide 0.75 MG/0.5ML Sopn Commonly known as:  TRULICITY Inject in the abdominal skin as directed once a week   FERRO-PLEX HEMATINIC 115-1 MG Tabs Take by mouth.   fluticasone 50 MCG/ACT nasal spray Commonly known as:  FLONASE Place 1 spray into both nostrils as needed.   furosemide 20 MG tablet Commonly known as:  LASIX TAKE 1 TABLET BY MOUTH EVERY DAY AS NEEDED(WEIGHT GAIN 3 LBS IN A DAY OR 5 LBS IN A WEEK)   glucose blood test strip Commonly known as:  ONE TOUCH ULTRA TEST Use to check blood sugar 3 times daily Dx code E11.65   glucose blood test strip Commonly known as:  ONETOUCH VERIO Use to check blood sugar 3 times daily Dx code E11.65   Insulin Pen Needle 32G X 4 MM Misc Commonly known as:  BD PEN NEEDLE NANO U/F Use 4 per day   isosorbide-hydrALAZINE 20-37.5 MG tablet Commonly known as:  BIDIL Take 2 tablets by mouth 2 (two) times daily.   lisinopril 40 MG tablet Commonly known as:  PRINIVIL,ZESTRIL Take 40 mg  by mouth  daily.   metFORMIN 500 MG 24 hr tablet Commonly known as:  GLUCOPHAGE-XR TAKE 1 tab in am and  2 TABLETS(1000 MG) BY MOUTH DAILY WITH SUPPER   nitroGLYCERIN 0.4 MG SL tablet Commonly known as:  NITROSTAT Place 1 tablet (0.4 mg total) under the tongue every 5 (five) minutes as needed for chest pain.   oxymetazoline 0.05 % nasal spray Commonly known as:  AFRIN Place 1 spray into both nostrils daily as needed for congestion.   PROAIR HFA 108 (90 Base) MCG/ACT inhaler Generic drug:  albuterol   simvastatin 40 MG tablet Commonly known as:  ZOCOR   sodium chloride 0.65 % Soln nasal spray Commonly known as:  OCEAN Place 1 spray into both nostrils daily as needed for congestion.   Vitamin B-12 500 MCG Subl Place under the tongue.   Vitamin D (Ergocalciferol) 50000 units Caps capsule Commonly known as:  DRISDOL Take 50,000 Units by mouth every 7 (seven) days.       Allergies: No Known Allergies  Past Medical History:  Diagnosis Date  . CAD (coronary artery disease)    non-obstructive by LHC (09/25/2013): Proximal and mid LAD serial 20%, proximal circumflex 30%, mid AV groove circumflex 30%, mid RCA mild plaque.  . Carotid stenosis    a. Carotid US (09/26/2011): Bilateral 1-39% ICA.  Marland Kitchen Chronic combined systolic and diastolic CHF (congestive heart failure) ([place])   . CKD (chronic kidney disease)   . Diabetes mellitus without complication ([place])   . History of ovarian cancer   . Hyperlipidemia   . Hypertension   . NICM (nonischemic cardiomyopathy) ([place])    a. Echocardiogram (09/08/2013): Mild LVH, EF 30-35%, no WMA, mild LAE.  Marland Kitchen Obesity (BMI 30-39.9)     Past Surgical History:  Procedure Laterality Date  . ABDOMINAL HYSTERECTOMY    . LEFT HEART CATHETERIZATION WITH CORONARY ANGIOGRAM N/A 09/25/2013   Procedure: LEFT HEART CATHETERIZATION WITH CORONARY ANGIOGRAM;  Surgeon: Burnell Blanks, MD;  Location: [facility] CATH LAB;  Service: Cardiovascular;  Laterality: N/A;    Family  History  Problem Relation Age of Onset  . Diabetes Father     Social History:  reports that she has never smoked. She has never used smokeless tobacco. She reports that she does not drink alcohol or use drugs.    Review of Systems   At times has nausea  Thyroid: She has a 2.8 cm mixed echogenic predominantly solid nodule on the right side and a biopsy was benign        Lipids:  has been treated with Lipitor by PCP with good control       Lab Results  Component Value Date   CHOL 140 09/08/2016   HDL 46.20 09/08/2016   LDLCALC 78 09/08/2016   TRIG 76.0 09/08/2016   CHOLHDL 3 09/08/2016                   The blood pressure has been treated with carvedilol, amlodipine and lisinopril .   Is not on hydralazine/isosorbide, Previously given for history of CHF.    She has been followed by cardiologist and nephrologist     BP Readings from Last 3 Encounters:  09/17/16 135/72  06/11/16 126/86  04/10/16 (!) 150/90      LABS:  No visits with results within 1 Week(s) from this visit.  Latest known visit with results is:  Lab on 09/08/2016  Component Date Value Ref Range Status  . Hgb A1c MFr Bld  09/08/2016 7.1* 4.6 - 6.5 % Final  . Sodium 09/08/2016 143  135 - 145 mEq/L Final  . Potassium 09/08/2016 4.2  3.5 - 5.1 mEq/L Final  . Chloride 09/08/2016 111  96 - 112 mEq/L Final  . CO2 09/08/2016 25  19 - 32 mEq/L Final  . Glucose, Bld 09/08/2016 113* 70 - 99 mg/dL Final  . BUN 09/08/2016 24* 6 - 23 mg/dL Final  . Creatinine, Ser 09/08/2016 1.33* 0.40 - 1.20 mg/dL Final  . Total Bilirubin 09/08/2016 0.3  0.2 - 1.2 mg/dL Final  . Alkaline Phosphatase 09/08/2016 39  39 - 117 U/L Final  . AST 09/08/2016 16  0 - 37 U/L Final  . ALT 09/08/2016 13  0 - 35 U/L Final  . Total Protein 09/08/2016 6.6  6.0 - 8.3 g/dL Final  . Albumin 09/08/2016 3.7  3.5 - 5.2 g/dL Final  . Calcium 09/08/2016 9.1  8.4 - 10.5 mg/dL Final  . GFR 09/08/2016 51.54* >60.00 mL/min Final  . Cholesterol  09/08/2016 140  0 - 200 mg/dL Final  . Triglycerides 09/08/2016 76.0  0.0 - 149.0 mg/dL Final  . HDL 09/08/2016 46.20  >39.00 mg/dL Final  . VLDL 09/08/2016 15.2  0.0 - 40.0 mg/dL Final  . LDL Cholesterol 09/08/2016 78  0 - 99 mg/dL Final  . Total CHOL/HDL Ratio 09/08/2016 3   Final  . NonHDL 09/08/2016 93.50   Final  . Microalb, Ur 09/08/2016 58.8* 0.0 - 1.9 mg/dL Final  . Creatinine,U 09/08/2016 175.0  mg/dL Final  . Microalb Creat Ratio 09/08/2016 33.6* 0.0 - 30.0 mg/g Final    Physical Examination:  BP 135/72 (Cuff Size: Normal)   Pulse 87   Ht 5\' 7"  (1.702 m)   Wt 202 lb (91.6 kg)   SpO2 97%   BMI 31.64 kg/m    ASSESSMENT:  Diabetes type 2, uncontrolled with obesity and BMI of 33 See history of present illness for detailed discussion of current diabetes management, blood sugar patterns and problems identified  Even with increasing her Trulicity her N3I has not come down Not clear how often she is checking her blood sugar but discussed that an A1c of 7.1 indicates blood sugars over 150 average She is likely not checking readings after meals enough Despite markedly decreased appetite and some reflux type symptoms she is not losing weight Recent symptoms may be related to her increase to the city  Her blood sugars are not as good with A1c going up to 7.1 and has been better before However she has taken only 1.44 Trulicity instead of 1.5 and emphasized that she needs to continue the higher dose especially with her difficulty with watching her diet and portions She does not do readings after her evening meal and usually does not bring her monitor for download for assessment  HYPERTENSION: Blood pressure is fairly good today, initially high in the office  Proteinuria: Relatively better now  PLAN:  She will need to bring her monitor for download For now will reduce her Trulicity back to 3.15 but increased metformin by 500 mg a day, discussed dosage range of metformin and  importance of weight loss Discussed checking blood sugars alternating fasting and postprandial     Patient Instructions  Reduce Trulicity to 0.75mg  weekly  Take 1 Metformin in am and 2 in pm with food   Check blood sugars on waking up 2x per week  Also check blood sugars about 2 hours after a meal and do this after  different meals by rotation  Recommended blood sugar levels on waking up is 90-130 and about 2 hours after meal is 130-160  Please bring your blood sugar monitor to each visit, thank you        [facility] 09/17/2016, 9:02 PM   Note: This office note was prepared with Dragon voice recognition system technology. Any transcriptional errors that result from this process are unintentional.

## 2016-11-24 ENCOUNTER — Other Ambulatory Visit: Payer: Self-pay | Admitting: Endocrinology

## 2016-12-01 ENCOUNTER — Encounter: Payer: Self-pay | Admitting: Endocrinology

## 2016-12-01 ENCOUNTER — Ambulatory Visit (INDEPENDENT_AMBULATORY_CARE_PROVIDER_SITE_OTHER): Payer: BC Managed Care – PPO | Admitting: Endocrinology

## 2016-12-01 VITALS — BP 142/78 | HR 86 | Ht 66.0 in | Wt 205.0 lb

## 2016-12-01 DIAGNOSIS — E1165 Type 2 diabetes mellitus with hyperglycemia: Secondary | ICD-10-CM

## 2016-12-01 LAB — COMPREHENSIVE METABOLIC PANEL
ALBUMIN: 3.9 g/dL (ref 3.5–5.2)
ALT: 12 U/L (ref 0–35)
AST: 14 U/L (ref 0–37)
Alkaline Phosphatase: 48 U/L (ref 39–117)
BUN: 23 mg/dL (ref 6–23)
CALCIUM: 9.6 mg/dL (ref 8.4–10.5)
CHLORIDE: 107 meq/L (ref 96–112)
CO2: 27 meq/L (ref 19–32)
CREATININE: 1.34 mg/dL — AB (ref 0.40–1.20)
GFR: 51.06 mL/min — AB (ref >60.00)
Glucose, Bld: 128 mg/dL — ABNORMAL HIGH (ref 70–99)
POTASSIUM: 4.7 meq/L (ref 3.5–5.1)
Sodium: 140 mEq/L (ref 135–145)
Total Bilirubin: 0.3 mg/dL (ref 0.2–1.2)
Total Protein: 6.9 g/dL (ref 6.0–8.3)

## 2016-12-01 LAB — POCT GLYCOSYLATED HEMOGLOBIN (HGB A1C): Hemoglobin A1C: 6.6

## 2016-12-01 NOTE — Progress Notes (Signed)
Patient ID: Teresa Poole, female   DOB: June 16, 1951, 66 y.o.   MRN: [id number]           Reason for Appointment: Follow-up for Type 2 Diabetes  Referring physician: Fulp  History of Present Illness:          Diagnosis: Type 2 diabetes mellitus, date of diagnosis: 2000        Past history: At diagnosis she was having fatigue and she was initially treated with metformin Metformin was stopped about 1-1/2 years ago when she decided not to take any medications. She was in the hospital in 08/2013 and her blood sugar was about 460 with A1c of 15. She was started on Lantus and NovoLog and has been continued on this regimen since then. Has not been back on any oral hypoglycemic drugs in the past year  Recent history:   Non-insulin hypoglycemic drugs the patient is taking are: Metformin 4-2.5 g, Trulicity 9.56 mg weekly  On her initial consultation she was started on Trulicity to help with her postprandial readings and she was able to stop insulin with this  Her A1c is somewhat better at 6.6%, previously 7.1    She did not bring her monitor for download again   Current management, blood sugar patterns and problems identified:  On her visit in 08/2016 she was having significantly decreased appetite and her Trulicity was changed back to 0.75 mg weekly  Also she was told to try and take 3 metformin ER tablets daily for better fasting blood sugar affect  She is not however taking metformin and suggested with one at breakfast and 2 at dinner and she is trying to spread them out with missing the last dose periodically  She has however gained back some weight  She thinks that she has gradually regained her appetite and has no nausea  More recently she says that because of her schedule she is not planning meals and eating out more including fast food  However checking her blood sugars very sporadically  Lab glucose was 128            Side effects from medications have been:  None Compliance with the medical regimen: Fair  Glucose monitoring:  by recall: none for 3 weeks, previously 135-148    Glucometer: One Touch.     Self-care:     Meals:  2-3 meals per day. Breakfast is meat, egg, potatoes and sometimes oatmeal/fruit.  May sometimes miss lunch Does avoid fried food but eating out more, some sweets         Exercise: walking less now        Dietician visit, most recent: 3/16.               Weight history: Range 205-245  Wt Readings from Last 3 Encounters:  12/01/16 205 lb (93 kg)  09/17/16 202 lb (91.6 kg)  06/11/16 200 lb 12.8 oz (91.1 kg)    Glycemic control:    Lab Results  Component Value Date   HGBA1C 6.6 12/01/2016   HGBA1C 7.1 (H) 09/08/2016   HGBA1C 7.1 (H) 06/08/2016   Lab Results  Component Value Date   MICROALBUR 58.8 (H) 09/08/2016   LDLCALC 78 09/08/2016   CREATININE 1.34 (H) 12/01/2016   Microalbumin creatinine ratio on 09/28/14 = 3730 and creatinine clearance 46 with creatinine level I.4  Office Visit on 12/01/2016  Component Date Value Ref Range Status  . Hemoglobin A1C 12/01/2016 6.6   Final  . Sodium 12/01/2016  140  135 - 145 mEq/L Final  . Potassium 12/01/2016 4.7  3.5 - 5.1 mEq/L Final  . Chloride 12/01/2016 107  96 - 112 mEq/L Final  . CO2 12/01/2016 27  19 - 32 mEq/L Final  . Glucose, Bld 12/01/2016 128* 70 - 99 mg/dL Final  . BUN 12/01/2016 23  6 - 23 mg/dL Final  . Creatinine, Ser 12/01/2016 1.34* 0.40 - 1.20 mg/dL Final  . Total Bilirubin 12/01/2016 0.3  0.2 - 1.2 mg/dL Final  . Alkaline Phosphatase 12/01/2016 48  39 - 117 U/L Final  . AST 12/01/2016 14  0 - 37 U/L Final  . ALT 12/01/2016 12  0 - 35 U/L Final  . Total Protein 12/01/2016 6.9  6.0 - 8.3 g/dL Final  . Albumin 12/01/2016 3.9  3.5 - 5.2 g/dL Final  . Calcium 12/01/2016 9.6  8.4 - 10.5 mg/dL Final  . GFR 12/01/2016 51.06* >60.00 mL/min Final     Allergies as of 12/01/2016   No Known Allergies     Medication List       Accurate as of  12/01/16  8:47 PM. Always use your most recent med list.          amLODipine 10 MG tablet Commonly known as:  NORVASC Take 1 tablet (10 mg total) by mouth daily.   aspirin 81 MG EC tablet Take 1 tablet (81 mg total) by mouth daily.   carvedilol 12.5 MG tablet Commonly known as:  COREG Take 1 tablet (12.5 mg total) by mouth 2 (two) times daily with a meal.   FERRO-PLEX HEMATINIC 115-1 MG Tabs Take by mouth.   fluticasone 50 MCG/ACT nasal spray Commonly known as:  FLONASE Place 1 spray into both nostrils as needed.   furosemide 20 MG tablet Commonly known as:  LASIX TAKE 1 TABLET BY MOUTH EVERY DAY AS NEEDED(WEIGHT GAIN 3 LBS IN A DAY OR 5 LBS IN A WEEK)   glucose blood test strip Commonly known as:  ONE TOUCH ULTRA TEST Use to check blood sugar 3 times daily Dx code E11.65   glucose blood test strip Commonly known as:  ONETOUCH VERIO Use to check blood sugar 3 times daily Dx code E11.65   Insulin Pen Needle 32G X 4 MM Misc Commonly known as:  BD PEN NEEDLE NANO U/F Use 4 per day   isosorbide-hydrALAZINE 20-37.5 MG tablet Commonly known as:  BIDIL Take 2 tablets by mouth 2 (two) times daily.   lisinopril 40 MG tablet Commonly known as:  PRINIVIL,ZESTRIL Take 40 mg by mouth daily.   metFORMIN 500 MG 24 hr tablet Commonly known as:  GLUCOPHAGE-XR TAKE 1 tab in am and  2 TABLETS(1000 MG) BY MOUTH DAILY WITH SUPPER   nitroGLYCERIN 0.4 MG SL tablet Commonly known as:  NITROSTAT Place 1 tablet (0.4 mg total) under the tongue every 5 (five) minutes as needed for chest pain.   oxymetazoline 0.05 % nasal spray Commonly known as:  AFRIN Place 1 spray into both nostrils daily as needed for congestion.   PROAIR HFA 108 (90 Base) MCG/ACT inhaler Generic drug:  albuterol   simvastatin 40 MG tablet Commonly known as:  ZOCOR   sodium chloride 0.65 % Soln nasal spray Commonly known as:  OCEAN Place 1 spray into both nostrils daily as needed for congestion.   TRULICITY  0.81 KG/8.1EH Sopn Generic drug:  Dulaglutide INJECT ONE PEN INTO THE ABDOMINAL SKIN AS DIRECTED EVERY WEEK   Vitamin B-12 500 MCG Subl Place  under the tongue.   Vitamin D (Ergocalciferol) 50000 units Caps capsule Commonly known as:  DRISDOL Take 50,000 Units by mouth every 7 (seven) days.       Allergies: No Known Allergies  Past Medical History:  Diagnosis Date  . CAD (coronary artery disease)    non-obstructive by LHC (09/25/2013): Proximal and mid LAD serial 20%, proximal circumflex 30%, mid AV groove circumflex 30%, mid RCA mild plaque.  . Carotid stenosis    a. Carotid US (09/26/2011): Bilateral 1-39% ICA.  Marland Kitchen Chronic combined systolic and diastolic CHF (congestive heart failure) ([place])   . CKD (chronic kidney disease)   . Diabetes mellitus without complication ([place])   . History of ovarian cancer   . Hyperlipidemia   . Hypertension   . NICM (nonischemic cardiomyopathy) ([place])    a. Echocardiogram (09/08/2013): Mild LVH, EF 30-35%, no WMA, mild LAE.  Marland Kitchen Obesity (BMI 30-39.9)     Past Surgical History:  Procedure Laterality Date  . ABDOMINAL HYSTERECTOMY    . LEFT HEART CATHETERIZATION WITH CORONARY ANGIOGRAM N/A 09/25/2013   Procedure: LEFT HEART CATHETERIZATION WITH CORONARY ANGIOGRAM;  Surgeon: Burnell Blanks, MD;  Location: [facility] CATH LAB;  Service: Cardiovascular;  Laterality: N/A;    Family History  Problem Relation Age of Onset  . Diabetes Father     Social History:  reports that she has never smoked. She has never used smokeless tobacco. She reports that she does not drink alcohol or use drugs.    Review of Systems    Thyroid: She has a 2.8 cm mixed echogenic predominantly solid nodule on the right side and a biopsy was benign        Lipids:  has been treated with Simvastatin 40 mg by PCP with good control       Lab Results  Component Value Date   CHOL 140 09/08/2016   HDL 46.20 09/08/2016   LDLCALC 78 09/08/2016   TRIG 76.0 09/08/2016   CHOLHDL 3  09/08/2016                   The blood pressure has been treated with carvedilol, amlodipine 10 mg and lisinopril 40 mg.   Is not on hydralazine/isosorbide, Previously given for history of CHF.    She has been followed by cardiologist and nephrologist     BP Readings from Last 3 Encounters:  12/01/16 (!) 142/78  09/17/16 135/72  06/11/16 126/86      LABS:  Office Visit on 12/01/2016  Component Date Value Ref Range Status  . Hemoglobin A1C 12/01/2016 6.6   Final  . Sodium 12/01/2016 140  135 - 145 mEq/L Final  . Potassium 12/01/2016 4.7  3.5 - 5.1 mEq/L Final  . Chloride 12/01/2016 107  96 - 112 mEq/L Final  . CO2 12/01/2016 27  19 - 32 mEq/L Final  . Glucose, Bld 12/01/2016 128* 70 - 99 mg/dL Final  . BUN 12/01/2016 23  6 - 23 mg/dL Final  . Creatinine, Ser 12/01/2016 1.34* 0.40 - 1.20 mg/dL Final  . Total Bilirubin 12/01/2016 0.3  0.2 - 1.2 mg/dL Final  . Alkaline Phosphatase 12/01/2016 48  39 - 117 U/L Final  . AST 12/01/2016 14  0 - 37 U/L Final  . ALT 12/01/2016 12  0 - 35 U/L Final  . Total Protein 12/01/2016 6.9  6.0 - 8.3 g/dL Final  . Albumin 12/01/2016 3.9  3.5 - 5.2 g/dL Final  . Calcium 12/01/2016 9.6  8.4 - 10.5  mg/dL Final  . GFR 12/01/2016 51.06* >60.00 mL/min Final    Physical Examination:  BP (!) 142/78 (BP Location: Right Arm)   Pulse 86   Ht 5\' 6"  (1.676 m)   Wt 205 lb (93 kg)   BMI 33.09 kg/m   Patient has significant pressure of speech and some tangential conversation No ankle edema present   ASSESSMENT:  Diabetes type 2, uncontrolled with obesity and BMI of 33 See history of present illness for detailed discussion of current diabetes management, blood sugar patterns and problems identified  She has better blood sugar control even though she has been watching her diet poorly Has gained weight also Most likely is benefiting from higher dose of metformin, also tolerating Trulicity better with the 0.75 mg dose She had some anorexia with the  1.5 mg Trulicity As discussed above she is not checking her blood sugars, not motivated to watch her diet and not exercising   PLAN:  She will need to start checking her blood sugars consistently especially after meals and bring her monitor for download Explained to her that sheneeds to take her metformin with meals especially at breakfast and dinner She will discuss any other issues such as heartburn with PCP  Encouraged her to start walking for exercise She needs to cut back on fast food especially high fat meals, may also be getting excessive sodium from doing this     Patient Instructions  Metformin 1 at Breakfast and 2 at dinner  Check blood sugars on waking up 2-3x weekly   Also check blood sugars about 2-3  hours after any meal and do this after different meals by rotation  Recommended blood sugar levels on waking up is 90-130 and about 2 hours after meal is 130-160  Please bring your blood sugar monitor to each visit, thank you      [facility] 12/01/2016, 8:47 PM   Note: This office note was prepared with Dragon voice recognition system technology. Any transcriptional errors that result from this process are unintentional.

## 2016-12-01 NOTE — Patient Instructions (Addendum)
Metformin 1 at Breakfast and 2 at dinner  Check blood sugars on waking up 2-3x weekly   Also check blood sugars about 2-3  hours after any meal and do this after different meals by rotation  Recommended blood sugar levels on waking up is 90-130 and about 2 hours after meal is 130-160  Please bring your blood sugar monitor to each visit, thank you

## 2016-12-17 ENCOUNTER — Other Ambulatory Visit: Payer: Self-pay | Admitting: Endocrinology

## 2016-12-21 ENCOUNTER — Encounter: Payer: Self-pay | Admitting: Family Medicine

## 2016-12-21 ENCOUNTER — Ambulatory Visit (INDEPENDENT_AMBULATORY_CARE_PROVIDER_SITE_OTHER): Payer: BC Managed Care – PPO | Admitting: Family Medicine

## 2016-12-21 VITALS — BP 160/80 | HR 76 | Temp 98.6°F | Ht 66.0 in | Wt 204.6 lb

## 2016-12-21 DIAGNOSIS — Z1321 Encounter for screening for nutritional disorder: Secondary | ICD-10-CM

## 2016-12-21 DIAGNOSIS — Z130 Encounter for screening for diseases of the blood and blood-forming organs and certain disorders involving the immune mechanism: Secondary | ICD-10-CM | POA: Diagnosis not present

## 2016-12-21 DIAGNOSIS — I10 Essential (primary) hypertension: Secondary | ICD-10-CM | POA: Diagnosis not present

## 2016-12-21 DIAGNOSIS — N183 Chronic kidney disease, stage 3 unspecified: Secondary | ICD-10-CM

## 2016-12-21 DIAGNOSIS — E785 Hyperlipidemia, unspecified: Secondary | ICD-10-CM | POA: Diagnosis not present

## 2016-12-21 DIAGNOSIS — E1169 Type 2 diabetes mellitus with other specified complication: Secondary | ICD-10-CM | POA: Diagnosis not present

## 2016-12-21 DIAGNOSIS — Z1159 Encounter for screening for other viral diseases: Secondary | ICD-10-CM | POA: Diagnosis not present

## 2016-12-21 DIAGNOSIS — Z1329 Encounter for screening for other suspected endocrine disorder: Secondary | ICD-10-CM

## 2016-12-21 LAB — CBC
HCT: 34.4 % — ABNORMAL LOW (ref 36.0–46.0)
HEMOGLOBIN: 11.4 g/dL — AB (ref 12.0–15.0)
MCHC: 33.1 g/dL (ref 30.0–36.0)
MCV: 87.2 fl (ref 78.0–100.0)
PLATELETS: 319 10*3/uL (ref 150.0–400.0)
RBC: 3.94 Mil/uL (ref 3.87–5.11)
RDW: 13.9 % (ref 11.5–15.5)
WBC: 5.8 10*3/uL (ref 4.0–10.5)

## 2016-12-21 LAB — VITAMIN D 25 HYDROXY (VIT D DEFICIENCY, FRACTURES): VITD: 21.69 ng/mL — ABNORMAL LOW (ref 30.00–100.00)

## 2016-12-21 LAB — TSH: TSH: 1.66 u[IU]/mL (ref 0.35–4.50)

## 2016-12-21 MED ORDER — ATORVASTATIN CALCIUM 40 MG PO TABS: 40.0000 mg | ORAL_TABLET | Freq: Every day | ORAL | 3 refills | Status: DC

## 2016-12-21 MED ORDER — LISINOPRIL 40 MG PO TABS: 40.0000 mg | ORAL_TABLET | Freq: Every day | ORAL | 3 refills | Status: DC

## 2016-12-21 MED ORDER — AMLODIPINE BESYLATE 10 MG PO TABS: 10.0000 mg | ORAL_TABLET | Freq: Every day | ORAL | 3 refills | Status: DC

## 2016-12-21 MED ORDER — CARVEDILOL 12.5 MG PO TABS: 12.5000 mg | ORAL_TABLET | Freq: Two times a day (BID) | ORAL | 3 refills | Status: DC

## 2016-12-21 NOTE — Progress Notes (Signed)
[place] at [facility] [address], [place], [place] [address] [phone]  Date:  12/21/2016   Name:  Teresa Poole   DOB:  March 09, 1951   MRN:  [id number]  PCP:  Antony Blackbird, MD    Chief Complaint: Establish Care   History of Present Illness:  Teresa Poole is a 66 y.o. very pleasant female patient who presents with the following:  Here today to establish care- she had been a pt of Dr. Everlene Farrier before he retired.  History of DM, HTN, CKD3 She sees Dr. Dwyane Dee for her DM- she is on trulicity, metformin  Lab Results  Component Value Date   HGBA1C 6.6 12/01/2016    Teresa Poole is her cardiologist.  She has a history of CHF.  Most recent visit that I can see in Epic was from about one year ago.  Most recenet echo as below   Last echo 5/15 - Left ventricle: Difficult acoustic windows make evaluation difficult. LVEF Is approximately 35% with diffuse hypokinesis, worse in the inferior wall. In comparisone to echo from January 2015 no significant change The cavity size was mildly dilated. Wall thickness was increased in a pattern of mild LVH. - Right ventricle: Systolic function was mildly reduced. Impressions:  She had been a pt at another office, but her doctor left the office so she has come to establish with [place]. She had been with Sadie Haber most recently  Reports that her home BP readings are generally "140 over 76 or something"  She enjoys walking a lot for exercise and just for fun.  She likes to go to [employer], fairs, etc- any event where she can do a lot of walking  BP Readings from Last 3 Encounters:  12/21/16 (!) 160/80  12/01/16 (!) 142/78  09/17/16 135/72    Patient Active Problem List   Diagnosis Date Noted  . Altered mental status 01/17/2014  . Other primary cardiomyopathies 10/26/2013  . Mixed hyperlipidemia 10/26/2013  . Chronic combined systolic and diastolic heart failure ([place]) 09/28/2013   . CKD (chronic kidney disease), stage III 09/24/2013  . Shortness of breath 09/18/2013  . Hypertensive urgency 09/18/2013  . Cardiomyopathy, hypertensive ([place]) 09/18/2013  . Edema 09/14/2013  . Acute combined systolic and diastolic congestive heart failure ([place]) 09/09/2013  . Hypertensive heart disease with CHF (congestive heart failure) ([place]) 09/09/2013  . AKI (acute kidney injury) ([place]) 09/09/2013  . Uncontrolled hypertension 09/07/2013  . Diabetes mellitus type II, uncontrolled ([place]) 09/07/2013  . Flu-like symptoms 09/07/2013  . Bronchitis 09/07/2013  . Obesity (BMI 30-39.9)   . History of ovarian cancer     Past Medical History:  Diagnosis Date  . Allergy   . Anemia   . CAD (coronary artery disease)    non-obstructive by LHC (09/25/2013): Proximal and mid LAD serial 20%, proximal circumflex 30%, mid AV groove circumflex 30%, mid RCA mild plaque.  . Cancer ([place])   . Carotid stenosis    a. Carotid US (09/26/2011): Bilateral 1-39% ICA.  Marland Kitchen Chronic combined systolic and diastolic CHF (congestive heart failure) ([place])   . CKD (chronic kidney disease)   . Diabetes mellitus without complication ([place])   . GERD (gastroesophageal reflux disease)   . History of ovarian cancer   . Hyperlipidemia   . Hypertension   . NICM (nonischemic cardiomyopathy) ([place])    a. Echocardiogram (09/08/2013): Mild LVH, EF 30-35%, no WMA, mild LAE.  Marland Kitchen Obesity (BMI 30-39.9)   .  Thyroid disease    Seen by specialist    Past Surgical History:  Procedure Laterality Date  . LEFT HEART CATHETERIZATION WITH CORONARY ANGIOGRAM N/A 09/25/2013   Procedure: LEFT HEART CATHETERIZATION WITH CORONARY ANGIOGRAM;  Surgeon: Burnell Blanks, MD;  Location: [facility] CATH LAB;  Service: Cardiovascular;  Laterality: N/A;    Social History  Substance Use Topics  . Smoking status: Never Smoker  . Smokeless tobacco: Never Used  . Alcohol use No    Family History  Problem Relation Age of Onset  . Diabetes Father     No  Known Allergies  Medication list has been reviewed and updated.  Current Outpatient Prescriptions on File Prior to Visit  Medication Sig Dispense Refill  . amLODipine (NORVASC) 10 MG tablet Take 1 tablet (10 mg total) by mouth daily. 30 tablet 11  . aspirin EC 81 MG EC tablet Take 1 tablet (81 mg total) by mouth daily. 90 tablet 0  . carvedilol (COREG) 12.5 MG tablet Take 1 tablet (12.5 mg total) by mouth 2 (two) times daily with a meal. 60 tablet 11  . glucose blood (ONE TOUCH ULTRA TEST) test strip Use to check blood sugar 3 times daily Dx code E11.65 100 each 3  . glucose blood (ONETOUCH VERIO) test strip Use to check blood sugar 3 times daily Dx code E11.65 200 each 5  . lisinopril (PRINIVIL,ZESTRIL) 40 MG tablet Take 40 mg by mouth daily.    . metFORMIN (GLUCOPHAGE-XR) 500 MG 24 hr tablet TAKE 1 tab in am and  2 TABLETS(1000 MG) BY MOUTH DAILY WITH SUPPER 90 tablet 4   No current facility-administered medications on file prior to visit.     Review of Systems:  As per HPI- otherwise negative.   Physical Examination: Vitals:   12/21/16 1205  BP: (!) 160/80  Pulse: 76  Temp: 98.6 F (37 C)   Vitals:   12/21/16 1205  Weight: 204 lb 9.6 oz (92.8 kg)  Height: 5\' 6"  (1.676 m)   Body mass index is 33.02 kg/m. Ideal Body Weight: Weight in (lb) to have BMI = 25: 154.6  GEN: WDWN, NAD, Non-toxic, A & O x 3, overweight, looks well HEENT: Atraumatic, Normocephalic. Neck supple. No masses, No LAD. Ears and Nose: No external deformity. CV: RRR, No M/G/R. No JVD. No thrill. No extra heart sounds. PULM: CTA B, no wheezes, crackles, rhonchi. No retractions. No resp. distress. No accessory muscle use. ABD: S, NT, ND EXTR: No c/c/e NEURO Normal gait.  PSYCH: Normally interactive. Conversant. Not depressed or anxious appearing.  Calm demeanor. Very talkative and has a hard time keeping to interview type conversation, making history taking a challenge   Assessment and  Plan: Hyperlipidemia associated with type 2 diabetes mellitus ([name]) - Plan: atorvastatin (LIPITOR) 40 MG tablet  Essential hypertension - Plan: amLODipine (NORVASC) 10 MG tablet, carvedilol (COREG) 12.5 MG tablet, lisinopril (PRINIVIL,ZESTRIL) 40 MG tablet  Encounter for vitamin deficiency screening - Plan: Vitamin D (25 hydroxy)  Screening for thyroid disorder - Plan: TSH  Screening for deficiency anemia - Plan: CBC  Encounter for hepatitis C screening test for low risk patient - Plan: Hepatitis C antibody  Here today to establish care She needs several refills which I provided today Recent CMP and CBC in epic Her BP is high today, but she reports it is normal at home.  She will continue to monitor this and let me know if running too hgih Plan to see her in 4-6 months  to check on her progress Will plan further follow- up pending labs.   Signed Lamar Blinks, MD

## 2016-12-21 NOTE — Patient Instructions (Signed)
Continue trying to lose weight Keep an eye on your blood pressure- if you start running higher than 140/80 please let me know I will be in touch with your labs Please see me in 4-6 months Take care!

## 2016-12-21 NOTE — Progress Notes (Signed)
Pre visit review using our clinic tool,if applicable. No additional management support is needed unless otherwise documented below in the visit note.  

## 2016-12-22 ENCOUNTER — Encounter: Payer: Self-pay | Admitting: Family Medicine

## 2016-12-22 LAB — HEPATITIS C ANTIBODY: HCV Ab: NEGATIVE

## 2017-01-27 ENCOUNTER — Telehealth: Payer: Self-pay | Admitting: Endocrinology

## 2017-01-27 NOTE — Telephone Encounter (Signed)
Please refill, all diabetes medications on my patients are being prescribed by me regardless

## 2017-01-27 NOTE — Telephone Encounter (Signed)
Please advise if okay to refill. Historical Provider has been filling.

## 2017-02-25 ENCOUNTER — Other Ambulatory Visit: Payer: Self-pay | Admitting: Endocrinology

## 2017-03-02 ENCOUNTER — Other Ambulatory Visit (INDEPENDENT_AMBULATORY_CARE_PROVIDER_SITE_OTHER): Payer: BC Managed Care – PPO

## 2017-03-02 DIAGNOSIS — E1165 Type 2 diabetes mellitus with hyperglycemia: Secondary | ICD-10-CM | POA: Diagnosis not present

## 2017-03-02 LAB — COMPREHENSIVE METABOLIC PANEL
ALT: 16 U/L (ref 0–35)
AST: 19 U/L (ref 0–37)
Albumin: 3.7 g/dL (ref 3.5–5.2)
Alkaline Phosphatase: 47 U/L (ref 39–117)
BILIRUBIN TOTAL: 0.3 mg/dL (ref 0.2–1.2)
BUN: 22 mg/dL (ref 6–23)
CHLORIDE: 104 meq/L (ref 96–112)
CO2: 29 meq/L (ref 19–32)
CREATININE: 1.43 mg/dL — AB (ref 0.40–1.20)
Calcium: 9.6 mg/dL (ref 8.4–10.5)
GFR: 47.34 mL/min — ABNORMAL LOW (ref >60.00)
GLUCOSE: 136 mg/dL — AB (ref 70–99)
Potassium: 4.6 mEq/L (ref 3.5–5.1)
SODIUM: 141 meq/L (ref 135–145)
Total Protein: 6.6 g/dL (ref 6.0–8.3)

## 2017-03-02 LAB — HEMOGLOBIN A1C: HEMOGLOBIN A1C: 7.3 % — AB (ref 4.6–6.5)

## 2017-03-04 ENCOUNTER — Ambulatory Visit (INDEPENDENT_AMBULATORY_CARE_PROVIDER_SITE_OTHER): Payer: BC Managed Care – PPO | Admitting: Endocrinology

## 2017-03-04 ENCOUNTER — Encounter: Payer: Self-pay | Admitting: Endocrinology

## 2017-03-04 VITALS — BP 150/100 | HR 82 | Ht 66.0 in | Wt 201.8 lb

## 2017-03-04 DIAGNOSIS — E1165 Type 2 diabetes mellitus with hyperglycemia: Secondary | ICD-10-CM

## 2017-03-04 NOTE — Progress Notes (Signed)
Patient ID: Teresa Poole, female   DOB: [birth year], 66 y.o.   MRN: [id number]           Reason for Appointment: Follow-up for Type 2 Diabetes  Referring physician: Fulp  History of Present Illness:          Diagnosis: Type 2 diabetes mellitus, date of diagnosis: 2000        Past history: At diagnosis she was having fatigue and she was initially treated with metformin Metformin was stopped about 1-1/2 years ago when she decided not to take any medications. She was in the hospital in 08/2013 and her blood sugar was about 460 with A1c of 15. She was started on Lantus and NovoLog and has been continued on this regimen since then. Has not been back on any oral hypoglycemic drugs in the past year  Recent history:   Non-insulin hypoglycemic drugs the patient is taking are: Metformin 1 g, Trulicity 7.67 mg weekly  On her initial consultation she was started on Trulicity to help with her postprandial readings and she was able to stop insulin with this  Her A1c is increased back up to 7.3, previously was somewhat better at 6.6%    She did not bring her monitor for download again, she says she comes from work and leaves it at home   Current management, blood sugar patterns and problems identified:  She says she has been traveling a lot and eating poorly and this is causing her blood sugars to be not well controlled  However she does not check her blood sugars much and does not remember what they are  No side effects with taking Trulicity but she thinks she cannot tolerate more than 2 tablets of metformin now  Has lost a little weight  His trying to do some walking lately  Lab glucose was 136            Side effects from medications have been: GI distress with over 1000 mg metformin, Trulicity 1.5 mg causes decreased appetite Compliance with the medical regimen: Fair  Glucose monitoring:  by recall: none    Glucometer: One Touch.     Self-care:     Meals:  2-3 meals per  day. Breakfast is meat, egg, potatoes and sometimes oatmeal/fruit.  May sometimes miss lunch Does avoid fried food but eating out more sweets         Exercise: walking now        Dietician visit, most recent: 3/16.               Weight history: Range 205-245  Wt Readings from Last 3 Encounters:  03/04/17 201 lb 12.8 oz (91.5 kg)  12/21/16 204 lb 9.6 oz (92.8 kg)  12/01/16 205 lb (93 kg)    Glycemic control:    Lab Results  Component Value Date   HGBA1C 7.3 (H) 03/02/2017   HGBA1C 6.6 12/01/2016   HGBA1C 7.1 (H) 09/08/2016   Lab Results  Component Value Date   MICROALBUR 58.8 (H) 09/08/2016   LDLCALC 78 09/08/2016   CREATININE 1.43 (H) 03/02/2017   Microalbumin creatinine ratio on 09/28/14 = 3730 and creatinine clearance 46 with creatinine level I.4  Lab on 03/02/2017  Component Date Value Ref Range Status  . Hgb A1c MFr Bld 03/02/2017 7.3* 4.6 - 6.5 % Final   Glycemic Control Guidelines for People with Diabetes:Non Diabetic:  <6%Goal of Therapy: <7%Additional Action Suggested:  >8%   . Sodium 03/02/2017 141  135 -  145 mEq/L Final  . Potassium 03/02/2017 4.6  3.5 - 5.1 mEq/L Final  . Chloride 03/02/2017 104  96 - 112 mEq/L Final  . CO2 03/02/2017 29  19 - 32 mEq/L Final  . Glucose, Bld 03/02/2017 136* 70 - 99 mg/dL Final  . BUN 03/02/2017 22  6 - 23 mg/dL Final  . Creatinine, Ser 03/02/2017 1.43* 0.40 - 1.20 mg/dL Final  . Total Bilirubin 03/02/2017 0.3  0.2 - 1.2 mg/dL Final  . Alkaline Phosphatase 03/02/2017 47  39 - 117 U/L Final  . AST 03/02/2017 19  0 - 37 U/L Final  . ALT 03/02/2017 16  0 - 35 U/L Final  . Total Protein 03/02/2017 6.6  6.0 - 8.3 g/dL Final  . Albumin 03/02/2017 3.7  3.5 - 5.2 g/dL Final  . Calcium 03/02/2017 9.6  8.4 - 10.5 mg/dL Final  . GFR 03/02/2017 47.34* >60.00 mL/min Final     Allergies as of 03/04/2017   No Known Allergies     Medication List       Accurate as of 03/04/17  2:48 PM. Always use your most recent med list.           amLODipine 10 MG tablet Commonly known as:  NORVASC Take 1 tablet (10 mg total) by mouth daily.   aspirin 81 MG EC tablet Take 1 tablet (81 mg total) by mouth daily.   atorvastatin 40 MG tablet Commonly known as:  LIPITOR Take 1 tablet (40 mg total) by mouth daily.   carvedilol 12.5 MG tablet Commonly known as:  COREG Take 1 tablet (12.5 mg total) by mouth 2 (two) times daily with a meal.   glucose blood test strip Commonly known as:  ONE TOUCH ULTRA TEST Use to check blood sugar 3 times daily Dx code E11.65   glucose blood test strip Commonly known as:  ONETOUCH VERIO Use to check blood sugar 3 times daily Dx code E11.65   lisinopril 40 MG tablet Commonly known as:  PRINIVIL,ZESTRIL Take 1 tablet (40 mg total) by mouth daily.   metFORMIN 500 MG 24 hr tablet Commonly known as:  GLUCOPHAGE-XR TAKE 1 tab in am and  2 TABLETS(1000 MG) BY MOUTH DAILY WITH SUPPER   TRULICITY 3.71 IR/6.7EL Sopn Generic drug:  Dulaglutide INJECT ONE PEN INTO THE ABDOMINAL SKIN AS DIRECTED EVERY WEEK       Allergies: No Known Allergies  Past Medical History:  Diagnosis Date  . Allergy   . Anemia   . CAD (coronary artery disease)    non-obstructive by LHC (09/25/2013): Proximal and mid LAD serial 20%, proximal circumflex 30%, mid AV groove circumflex 30%, mid RCA mild plaque.  . Cancer ([place])   . Carotid stenosis    a. Carotid US (09/26/2011): Bilateral 1-39% ICA.  Marland Kitchen Chronic combined systolic and diastolic CHF (congestive heart failure) ([place])   . CKD (chronic kidney disease)   . Diabetes mellitus without complication ([place])   . GERD (gastroesophageal reflux disease)   . History of ovarian cancer   . Hyperlipidemia   . Hypertension   . NICM (nonischemic cardiomyopathy) ([place])    a. Echocardiogram (09/08/2013): Mild LVH, EF 30-35%, no WMA, mild LAE.  Marland Kitchen Obesity (BMI 30-39.9)   . Thyroid disease    Seen by specialist    Past Surgical History:  Procedure Laterality Date  . LEFT HEART  CATHETERIZATION WITH CORONARY ANGIOGRAM N/A 09/25/2013   Procedure: LEFT HEART CATHETERIZATION WITH CORONARY ANGIOGRAM;  Surgeon: Burnell Blanks, MD;  Location: [facility] CATH LAB;  Service: Cardiovascular;  Laterality: N/A;    Family History  Problem Relation Age of Onset  . Diabetes Father     Social History:  reports that she has never smoked. She has never used smokeless tobacco. She reports that she does not drink alcohol or use drugs.    Review of Systems    Thyroid: She has a 2.8 cm mixed echogenic predominantly solid nodule on the right side and a biopsy was benign        Lipids:  has been treated with Simvastatin 40 mg by PCP with good control       Lab Results  Component Value Date   CHOL 140 09/08/2016   HDL 46.20 09/08/2016   LDLCALC 78 09/08/2016   TRIG 76.0 09/08/2016   CHOLHDL 3 09/08/2016                   The blood pressure has been treated with carvedilol, amlodipine 10 mg and lisinopril 40 mg.   Is not on hydralazine/isosorbide, Previously given for history of CHF.    She has been followed by cardiologist and nephrologist  He is somewhat anxious today   BP Readings from Last 3 Encounters:  03/04/17 (!) 150/100  12/21/16 (!) 160/80  12/01/16 (!) 142/78      LABS:  Lab on 03/02/2017  Component Date Value Ref Range Status  . Hgb A1c MFr Bld 03/02/2017 7.3* 4.6 - 6.5 % Final   Glycemic Control Guidelines for People with Diabetes:Non Diabetic:  <6%Goal of Therapy: <7%Additional Action Suggested:  >8%   . Sodium 03/02/2017 141  135 - 145 mEq/L Final  . Potassium 03/02/2017 4.6  3.5 - 5.1 mEq/L Final  . Chloride 03/02/2017 104  96 - 112 mEq/L Final  . CO2 03/02/2017 29  19 - 32 mEq/L Final  . Glucose, Bld 03/02/2017 136* 70 - 99 mg/dL Final  . BUN 03/02/2017 22  6 - 23 mg/dL Final  . Creatinine, Ser 03/02/2017 1.43* 0.40 - 1.20 mg/dL Final  . Total Bilirubin 03/02/2017 0.3  0.2 - 1.2 mg/dL Final  . Alkaline Phosphatase 03/02/2017 47  39 - 117 U/L  Final  . AST 03/02/2017 19  0 - 37 U/L Final  . ALT 03/02/2017 16  0 - 35 U/L Final  . Total Protein 03/02/2017 6.6  6.0 - 8.3 g/dL Final  . Albumin 03/02/2017 3.7  3.5 - 5.2 g/dL Final  . Calcium 03/02/2017 9.6  8.4 - 10.5 mg/dL Final  . GFR 03/02/2017 47.34* >60.00 mL/min Final    Physical Examination:  BP (!) 150/100   Pulse 82   Ht 5\' 6"  (1.676 m)   Wt 201 lb 12.8 oz (91.5 kg)   SpO2 98%   BMI 32.57 kg/m   1+ ankle edema  Diabetic Foot Exam - Simple   Simple Foot Form Diabetic Foot exam was performed with the following findings:  Yes 03/04/2017  2:12 PM  Visual Inspection No deformities, no ulcerations, no other skin breakdown bilaterally:  Yes Sensation Testing Intact to touch and monofilament testing bilaterally:  Yes Pulse Check Posterior Tibialis and Dorsalis pulse intact bilaterally:  Yes Comments       ASSESSMENT:  Diabetes type 2, uncontrolled with obesity and BMI of 33 See history of present illness for detailed discussion of current diabetes management, blood sugar patterns and problems identified  Her blood sugars have gone up somewhat even though she is losing a little weight This is  likely to be from her poor compliance with diet especially when traveling She was recommended consultation with dietitian but she does not want to do this She also does not want to take more than 2 metformin tablets a day  PLAN:  She will need to cut back on high glycemic foods and drinks Consistent glucose monitoring, discussed that she checked readings after meals this will help her with compliance with diet May consider adding Invokana blood sugars are not controlled on the next visit   Hypertension: She needs to follow-up with her cardiologist and nephrologist as blood pressure is high  She will request copies of her eye exam from the ophthalmologist  Patient Instructions  Check blood sugars on waking up  2/7  Also check blood sugars about 2 hours after a meal  and do this after different meals by rotation  Recommended blood sugar levels on waking up is 90-130 and about 2 hours after meal is 130-160  Please bring your blood sugar monitor to each visit, thank you  Stop sweet drinks, less carbs  Walk daily  Get eye exam report     [facility] 03/04/2017, 2:48 PM   Note: This office note was prepared with Dragon voice recognition system technology. Any transcriptional errors that result from this process are unintentional.

## 2017-03-04 NOTE — Patient Instructions (Addendum)
Check blood sugars on waking up  2/7  Also check blood sugars about 2 hours after a meal and do this after different meals by rotation  Recommended blood sugar levels on waking up is 90-130 and about 2 hours after meal is 130-160  Please bring your blood sugar monitor to each visit, thank you  Stop sweet drinks, less carbs  Walk daily  Get eye exam report

## 2017-04-01 ENCOUNTER — Other Ambulatory Visit: Payer: Self-pay

## 2017-04-01 ENCOUNTER — Telehealth: Payer: Self-pay | Admitting: Endocrinology

## 2017-04-01 ENCOUNTER — Other Ambulatory Visit: Payer: Self-pay | Admitting: Endocrinology

## 2017-04-01 NOTE — Telephone Encounter (Signed)
Patient says she needs a 90 day supply of Trulicity and she was only given 30.  Please advise.   -LL

## 2017-04-01 NOTE — Telephone Encounter (Signed)
Routing to you °

## 2017-04-01 NOTE — Telephone Encounter (Signed)
Called and let patient know that I have sent in her Trulicity prescription for her and to make sure this is ready for her to pick up.

## 2017-04-01 NOTE — Telephone Encounter (Signed)
Patient called in had questions regarding her dose of trulicity. She explained that Dr.Kumar had taken her off the 0.75 and put her on the 0.5, I explained to the patient that we took her off the 1.5 mg dose and placed her on the 0.75 which has 0.5 ml. I advised patient to go to the pharmacy and look at the box and if this still did not seem correct to call back.

## 2017-04-01 NOTE — Telephone Encounter (Signed)
MEDICATION: TRULICITY 2.98 OR/3.0YL SOPN  PHARMACY:  [facility] [place], [address] AT [facility] [address] [phone] (Phone) [phone] (Fax)   IS THIS A 90 DAY SUPPLY : yes  IS PATIENT OUT OF MEDICTAION: yes  IF NOT; HOW MUCH IS LEFT: n/a  LAST APPOINTMENT DATE:03/04/17  NEXT APPOINTMENT DATE:05/24/17  OTHER COMMENTS:    **Let patient know to contact pharmacy at the end of the day to make sure medication is ready. **  ** Please notify patient to allow 48-72 hours to process**  **Encourage patient to contact the pharmacy for refills or they can request refills through [facility]**

## 2017-04-02 ENCOUNTER — Other Ambulatory Visit: Payer: Self-pay

## 2017-04-02 MED ORDER — DULAGLUTIDE 0.75 MG/0.5ML ~~LOC~~ SOAJ: SUBCUTANEOUS | 3 refills | Status: DC

## 2017-04-02 NOTE — Telephone Encounter (Signed)
This has been changed to a 90 day prescription

## 2017-04-02 NOTE — Telephone Encounter (Signed)
Routing to you °

## 2017-05-01 ENCOUNTER — Other Ambulatory Visit: Payer: Self-pay | Admitting: Endocrinology

## 2017-05-19 ENCOUNTER — Other Ambulatory Visit (INDEPENDENT_AMBULATORY_CARE_PROVIDER_SITE_OTHER): Payer: BC Managed Care – PPO

## 2017-05-19 DIAGNOSIS — E1165 Type 2 diabetes mellitus with hyperglycemia: Secondary | ICD-10-CM

## 2017-05-19 LAB — COMPREHENSIVE METABOLIC PANEL
ALT: 11 U/L (ref 0–35)
AST: 14 U/L (ref 0–37)
Albumin: 3.7 g/dL (ref 3.5–5.2)
Alkaline Phosphatase: 40 U/L (ref 39–117)
BILIRUBIN TOTAL: 0.4 mg/dL (ref 0.2–1.2)
BUN: 26 mg/dL — ABNORMAL HIGH (ref 6–23)
CALCIUM: 9.4 mg/dL (ref 8.4–10.5)
CO2: 28 meq/L (ref 19–32)
CREATININE: 1.37 mg/dL — AB (ref 0.40–1.20)
Chloride: 103 mEq/L (ref 96–112)
GFR: 49.7 mL/min — ABNORMAL LOW (ref >60.00)
Glucose, Bld: 99 mg/dL (ref 70–99)
Potassium: 4.4 mEq/L (ref 3.5–5.1)
Sodium: 137 mEq/L (ref 135–145)
TOTAL PROTEIN: 6.4 g/dL (ref 6.0–8.3)

## 2017-05-19 LAB — HEMOGLOBIN A1C: Hgb A1c MFr Bld: 7.1 % — ABNORMAL HIGH (ref 4.6–6.5)

## 2017-05-24 ENCOUNTER — Ambulatory Visit (INDEPENDENT_AMBULATORY_CARE_PROVIDER_SITE_OTHER): Payer: BC Managed Care – PPO | Admitting: Endocrinology

## 2017-05-24 ENCOUNTER — Encounter: Payer: Self-pay | Admitting: Endocrinology

## 2017-05-24 VITALS — BP 150/80 | HR 81 | Ht 66.0 in | Wt 201.0 lb

## 2017-05-24 DIAGNOSIS — Z23 Encounter for immunization: Secondary | ICD-10-CM | POA: Diagnosis not present

## 2017-05-24 DIAGNOSIS — E1165 Type 2 diabetes mellitus with hyperglycemia: Secondary | ICD-10-CM | POA: Diagnosis not present

## 2017-05-24 NOTE — Progress Notes (Signed)
Patient ID: Teresa Poole, female   DOB: 21-Sep-1950, 65 y.o.   MRN: [id number]           Reason for Appointment: Follow-up for Type 2 Diabetes  Referring physician: Fulp  History of Present Illness:          Diagnosis: Type 2 diabetes mellitus, date of diagnosis: 2000        Past history: At diagnosis she was having fatigue and she was initially treated with metformin Metformin was stopped about 1-1/2 years ago when she decided not to take any medications. She was in the hospital in 08/2013 and her blood sugar was about 460 with A1c of 15. She was started on Lantus and NovoLog and has been continued on this regimen since then. Has not been back on any oral hypoglycemic drugs in the past year  Recent history:   Non-insulin hypoglycemic drugs the patient is taking are: Metformin 1 g, Trulicity 6.46 mg weekly  On her initial consultation she was started on Trulicity to help with her postprandial readings and she was able to stop insulin with this  Her A1c is minimally improved at 7.1, previous range 6.6-7.3    She did not bring her monitor for download again  Current management, blood sugar patterns and problems identified:  Her lab glucose was normal at 99 before lunch   Again she does not check her blood sugars much and does not remember what they are  She says she is trying to be walking more regularly but has not lost any weight  Also compared to her last visit she has been able to stop eating out as much and try to eat more healthy meals  Blood sugar today in the office was 137      Side effects from medications have been: GI distress with over 1000 mg metformin, Trulicity 1.5 mg causes decreased appetite Compliance with the medical regimen: Fair  Glucose monitoring:  by recall: 130 or so  Glucometer: One Touch.     Self-care:     Meals:  2-3 meals per day. Breakfast is meat, egg, potatoes and sometimes oatmeal/fruit.  May sometimes miss lunch Does avoid fried  food, less sweets          Exercise: walking some daily        Dietician visit, most recent: 3/16.               Weight history: Range 205-245  Wt Readings from Last 3 Encounters:  05/24/17 201 lb (91.2 kg)  03/04/17 201 lb 12.8 oz (91.5 kg)  12/21/16 204 lb 9.6 oz (92.8 kg)    Glycemic control:    Lab Results  Component Value Date   HGBA1C 7.1 (H) 05/19/2017   HGBA1C 7.3 (H) 03/02/2017   HGBA1C 6.6 12/01/2016   Lab Results  Component Value Date   MICROALBUR 58.8 (H) 09/08/2016   LDLCALC 78 09/08/2016   CREATININE 1.37 (H) 05/19/2017   Microalbumin creatinine ratio on 09/28/14 = 3730 and creatinine clearance 46 with creatinine level I.4  Lab on 05/19/2017  Component Date Value Ref Range Status  . Hgb A1c MFr Bld 05/19/2017 7.1* 4.6 - 6.5 % Final   Glycemic Control Guidelines for People with Diabetes:Non Diabetic:  <6%Goal of Therapy: <7%Additional Action Suggested:  >8%   . Sodium 05/19/2017 137  135 - 145 mEq/L Final  . Potassium 05/19/2017 4.4  3.5 - 5.1 mEq/L Final  . Chloride 05/19/2017 103  96 - 112 mEq/L Final  .  CO2 05/19/2017 28  19 - 32 mEq/L Final  . Glucose, Bld 05/19/2017 99  70 - 99 mg/dL Final  . BUN 05/19/2017 26* 6 - 23 mg/dL Final  . Creatinine, Ser 05/19/2017 1.37* 0.40 - 1.20 mg/dL Final  . Total Bilirubin 05/19/2017 0.4  0.2 - 1.2 mg/dL Final  . Alkaline Phosphatase 05/19/2017 40  39 - 117 U/L Final  . AST 05/19/2017 14  0 - 37 U/L Final  . ALT 05/19/2017 11  0 - 35 U/L Final  . Total Protein 05/19/2017 6.4  6.0 - 8.3 g/dL Final  . Albumin 05/19/2017 3.7  3.5 - 5.2 g/dL Final  . Calcium 05/19/2017 9.4  8.4 - 10.5 mg/dL Final  . GFR 05/19/2017 49.70* >60.00 mL/min Final     Allergies as of 05/24/2017   No Known Allergies     Medication List       Accurate as of 05/24/17  1:52 PM. Always use your most recent med list.          amLODipine 10 MG tablet Commonly known as:  NORVASC Take 1 tablet (10 mg total) by mouth daily.   aspirin 81  MG EC tablet Take 1 tablet (81 mg total) by mouth daily.   atorvastatin 40 MG tablet Commonly known as:  LIPITOR Take 1 tablet (40 mg total) by mouth daily.   carvedilol 12.5 MG tablet Commonly known as:  COREG Take 1 tablet (12.5 mg total) by mouth 2 (two) times daily with a meal.   Dulaglutide 0.75 MG/0.5ML Sopn Commonly known as:  TRULICITY INJECT ONE PEN INTO THE ABDOMINAL SKIN AS DIRECTED EVERY WEEK   glucose blood test strip Commonly known as:  ONE TOUCH ULTRA TEST Use to check blood sugar 3 times daily Dx code E11.65   glucose blood test strip Commonly known as:  ONETOUCH VERIO Use to check blood sugar 3 times daily Dx code E11.65   lisinopril 40 MG tablet Commonly known as:  PRINIVIL,ZESTRIL Take 1 tablet (40 mg total) by mouth daily.   metFORMIN 500 MG 24 hr tablet Commonly known as:  GLUCOPHAGE-XR TAKE 1 TABLET BY MOUTH EVERY MORNING AND 2 TABLETS BY MOUTH EVERY DAY WITH SUPPER       Allergies: No Known Allergies  Past Medical History:  Diagnosis Date  . Allergy   . Anemia   . CAD (coronary artery disease)    non-obstructive by LHC (09/25/2013): Proximal and mid LAD serial 20%, proximal circumflex 30%, mid AV groove circumflex 30%, mid RCA mild plaque.  . Cancer ([place])   . Carotid stenosis    a. Carotid US (09/26/2011): Bilateral 1-39% ICA.  Marland Kitchen Chronic combined systolic and diastolic CHF (congestive heart failure) ([place])   . CKD (chronic kidney disease)   . Diabetes mellitus without complication ([place])   . GERD (gastroesophageal reflux disease)   . History of ovarian cancer   . Hyperlipidemia   . Hypertension   . NICM (nonischemic cardiomyopathy) ([place])    a. Echocardiogram (09/08/2013): Mild LVH, EF 30-35%, no WMA, mild LAE.  Marland Kitchen Obesity (BMI 30-39.9)   . Thyroid disease    Seen by specialist    Past Surgical History:  Procedure Laterality Date  . LEFT HEART CATHETERIZATION WITH CORONARY ANGIOGRAM N/A 09/25/2013   Procedure: LEFT HEART CATHETERIZATION WITH  CORONARY ANGIOGRAM;  Surgeon: Burnell Blanks, MD;  Location: [facility] CATH LAB;  Service: Cardiovascular;  Laterality: N/A;    Family History  Problem Relation Age of Onset  . Diabetes  Father     Social History:  reports that she has never smoked. She has never used smokeless tobacco. She reports that she does not drink alcohol or use drugs.    Review of Systems    Thyroid: She has a 2.8 cm mixed echogenic predominantly solid nodule on the right side and a biopsy was benign        Lipids:  has been treated with Simvastatin 40 mg by PCP with good control       Lab Results  Component Value Date   CHOL 140 09/08/2016   HDL 46.20 09/08/2016   LDLCALC 78 09/08/2016   TRIG 76.0 09/08/2016   CHOLHDL 3 09/08/2016                   The blood pressure has been treated with carvedilol, amlodipine 10 mg and lisinopril 40 mg.   Is not on hydralazine/isosorbide, Previously given for history of CHF.     She has been followed by cardiologist and PCP but has not had any follow-up with PCP lately   She tends to be anxious and blood pressure was 161 systolic on first measurement   BP Readings from Last 3 Encounters:  05/24/17 (!) 150/80  03/04/17 (!) 150/100  12/21/16 (!) 160/80      LABS:  Lab on 05/19/2017  Component Date Value Ref Range Status  . Hgb A1c MFr Bld 05/19/2017 7.1* 4.6 - 6.5 % Final   Glycemic Control Guidelines for People with Diabetes:Non Diabetic:  <6%Goal of Therapy: <7%Additional Action Suggested:  >8%   . Sodium 05/19/2017 137  135 - 145 mEq/L Final  . Potassium 05/19/2017 4.4  3.5 - 5.1 mEq/L Final  . Chloride 05/19/2017 103  96 - 112 mEq/L Final  . CO2 05/19/2017 28  19 - 32 mEq/L Final  . Glucose, Bld 05/19/2017 99  70 - 99 mg/dL Final  . BUN 05/19/2017 26* 6 - 23 mg/dL Final  . Creatinine, Ser 05/19/2017 1.37* 0.40 - 1.20 mg/dL Final  . Total Bilirubin 05/19/2017 0.4  0.2 - 1.2 mg/dL Final  . Alkaline Phosphatase 05/19/2017 40  39 - 117 U/L Final  .  AST 05/19/2017 14  0 - 37 U/L Final  . ALT 05/19/2017 11  0 - 35 U/L Final  . Total Protein 05/19/2017 6.4  6.0 - 8.3 g/dL Final  . Albumin 05/19/2017 3.7  3.5 - 5.2 g/dL Final  . Calcium 05/19/2017 9.4  8.4 - 10.5 mg/dL Final  . GFR 05/19/2017 49.70* >60.00 mL/min Final    Physical Examination:  BP (!) 150/80 (BP Location: Right Arm, Cuff Size: Normal)   Pulse 81   Ht 5\' 6"  (1.676 m)   Wt 201 lb (91.2 kg)   SpO2 98%   BMI 32.44 kg/m       ASSESSMENT:  Diabetes type 2, uncontrolled with obesity and BMI of 33 See history of present illness for detailed discussion of current diabetes management, blood sugar patterns and problems identified  Her blood sugars have leveled off but difficult to know if they are still high after meals and she does not check her sugars She is still not motivated to do her blood sugars and forgets to do these including after meals A1c is reasonably good for her at 7.1  She has difficulty tolerating higher doses of metformin and Trulicity However she appears to be somewhat more motivated to watch her diet and walk regularly  PLAN:  She will need to do  some blood sugars, discussed that this will help her get some feedback on her dietary choices especially after meals at night Encourage her to walk more regularly She will continue the same medication regimen for now   Hypertension: She needs to follow-up with her PCP  She reportedly has had retinopathy in the past and need to know her last report    Patient Instructions  Need eye exam report   Influenza vaccine given  05/24/2017, 1:52 PM   Note: This office note was prepared with Dragon voice recognition system technology. Any transcriptional errors that result from this process are unintentional.

## 2017-05-24 NOTE — Patient Instructions (Signed)
Need eye exam report

## 2017-06-27 NOTE — Progress Notes (Deleted)
[place] at [facility] [address] [place], [place] [address] [phone]  Date:  06/30/2017   Name:  Teresa Poole   DOB:  10-26-1950   MRN:  [id number]  PCP:  Darreld Mclean, MD    Chief Complaint: No chief complaint on file.   History of Present Illness:  Teresa Poole is a 66 y.o. very pleasant female patient who presents with the following:  Here today for a periodic recheck visit.  History of Dm, HTN, CKD Last seen here by myself in April as follows:  Here today to establish care- she had been a pt of Dr. Everlene Farrier before he retired.  History of DM, HTN, CKD3 She sees Dr. Dwyane Dee for her DM- she is on trulicity, metformin  RecentLabs       Lab Results  Component Value Date   HGBA1C 6.6 12/01/2016     Einar Gip is her cardiologist.  She has a history of CHF.  Most recent visit that I can see in Epic was from about one year ago.  Most recenet echo as below Last echo 5/15 - Left ventricle: Difficult acoustic windows make evaluation difficult. LVEF Is approximately 35% with diffuse hypokinesis, worse in the inferior wall. In comparisone to echo from January 2015 no significant change The cavity size was mildly dilated. Wall thickness was increased in a pattern of mild LVH. - Right ventricle: Systolic function was mildly reduced. Impressions:  She had been a pt at another office, but her doctor left the office so she has come to establish with [place]. She had been with Sadie [facility]er most recently  Reports that her home BP readings are generally "140 over 76 or something"  She enjoys walking a lot for exercise and just for fun.  She likes to go to [employer], fairs, etc- any event where she can do a lot of walking  Lab Results  Component Value Date   HGBA1C 7.1 (H) 05/19/2017   Saw Dr. Dwyane Dee within the last 5 weeks  Eye exam: Mammo: Dexa: Needs prevnar 13 Colon cancer screening:   Patient  Active Problem List   Diagnosis Date Noted  . Mixed hyperlipidemia 10/26/2013  . Chronic combined systolic and diastolic heart failure ([place]) 09/28/2013  . CKD (chronic kidney disease), stage III ([place]) 09/24/2013  . Cardiomyopathy, hypertensive ([place]) 09/18/2013  . Edema 09/14/2013  . Acute combined systolic and diastolic congestive heart failure ([place]) 09/09/2013  . Hypertensive heart disease with CHF (congestive heart failure) ([place]) 09/09/2013  . Uncontrolled hypertension 09/07/2013  . Diabetes mellitus type II, uncontrolled ([place]) 09/07/2013  . Obesity (BMI 30-39.9)   . History of ovarian cancer     Past Medical History:  Diagnosis Date  . Allergy   . Anemia   . CAD (coronary artery disease)    non-obstructive by LHC (09/25/2013): Proximal and mid LAD serial 20%, proximal circumflex 30%, mid AV groove circumflex 30%, mid RCA mild plaque.  . Cancer ([place])   . Carotid stenosis    a. Carotid US (09/26/2011): Bilateral 1-39% ICA.  Marland Kitchen Chronic combined systolic and diastolic CHF (congestive heart failure) ([place])   . CKD (chronic kidney disease)   . Diabetes mellitus without complication ([place])   . GERD (gastroesophageal reflux disease)   . History of ovarian cancer   . Hyperlipidemia   . Hypertension   . NICM (nonischemic cardiomyopathy) ([place])    a. Echocardiogram (09/08/2013): Mild LVH, EF 30-35%, no WMA,  mild LAE.  Marland Kitchen Obesity (BMI 30-39.9)   . Thyroid disease    Seen by specialist    No past surgical history on file.  Social History   Tobacco Use  . Smoking status: Never Smoker  . Smokeless tobacco: Never Used  Substance Use Topics  . Alcohol use: No  . Drug use: No    Family History  Problem Relation Age of Onset  . Diabetes Father     No Known Allergies  Medication list has been reviewed and updated.  Current Outpatient Medications on File Prior to Visit  Medication Sig Dispense Refill  . amLODipine (NORVASC) 10 MG tablet Take 1 tablet (10 mg total) by mouth daily. 90  tablet 3  . aspirin EC 81 MG EC tablet Take 1 tablet (81 mg total) by mouth daily. 90 tablet 0  . atorvastatin (LIPITOR) 40 MG tablet Take 1 tablet (40 mg total) by mouth daily. 90 tablet 3  . carvedilol (COREG) 12.5 MG tablet Take 1 tablet (12.5 mg total) by mouth 2 (two) times daily with a meal. 180 tablet 3  . Dulaglutide (TRULICITY) 9.56 OZ/3.0QM SOPN INJECT ONE PEN INTO THE ABDOMINAL SKIN AS DIRECTED EVERY WEEK 6 mL 3  . glucose blood (ONE TOUCH ULTRA TEST) test strip Use to check blood sugar 3 times daily Dx code E11.65 100 each 3  . glucose blood (ONETOUCH VERIO) test strip Use to check blood sugar 3 times daily Dx code E11.65 200 each 5  . lisinopril (PRINIVIL,ZESTRIL) 40 MG tablet Take 1 tablet (40 mg total) by mouth daily. 90 tablet 3  . metFORMIN (GLUCOPHAGE-XR) 500 MG 24 hr tablet TAKE 1 TABLET BY MOUTH EVERY MORNING AND 2 TABLETS BY MOUTH EVERY DAY WITH SUPPER 90 tablet 0   No current facility-administered medications on file prior to visit.     Review of Systems:  As per HPI- otherwise negative.   Physical Examination: There were no vitals filed for this visit. There were no vitals filed for this visit. There is no height or weight on file to calculate BMI. Ideal Body Weight:    GEN: WDWN, NAD, Non-toxic, A & O x 3 HEENT: Atraumatic, Normocephalic. Neck supple. No masses, No LAD. Ears and Nose: No external deformity. CV: RRR, No M/G/R. No JVD. No thrill. No extra heart sounds. PULM: CTA B, no wheezes, crackles, rhonchi. No retractions. No resp. distress. No accessory muscle use. ABD: S, NT, ND, +BS. No rebound. No HSM. EXTR: No c/c/e NEURO Normal gait.  PSYCH: Normally interactive. Conversant. Not depressed or anxious appearing.  Calm demeanor.    Assessment and Plan: ***  Signed Lamar Blinks, MD

## 2017-06-30 ENCOUNTER — Ambulatory Visit: Payer: BC Managed Care – PPO | Admitting: Family Medicine

## 2017-07-01 ENCOUNTER — Ambulatory Visit (HOSPITAL_BASED_OUTPATIENT_CLINIC_OR_DEPARTMENT_OTHER)
Admission: RE | Admit: 2017-07-01 | Discharge: 2017-07-01 | Disposition: A | Discharge status: Home / Self Care | Payer: BC Managed Care – PPO | Source: Ambulatory Visit | Attending: Family Medicine | Admitting: Family Medicine

## 2017-07-01 ENCOUNTER — Ambulatory Visit: Payer: BC Managed Care – PPO | Admitting: Family Medicine

## 2017-07-01 ENCOUNTER — Encounter: Payer: Self-pay | Admitting: Family Medicine

## 2017-07-01 VITALS — BP 190/100 | HR 83 | Temp 98.4°F | Resp 16 | Wt 195.4 lb

## 2017-07-01 DIAGNOSIS — E785 Hyperlipidemia, unspecified: Secondary | ICD-10-CM

## 2017-07-01 DIAGNOSIS — Z23 Encounter for immunization: Secondary | ICD-10-CM | POA: Diagnosis not present

## 2017-07-01 DIAGNOSIS — N183 Chronic kidney disease, stage 3 unspecified: Secondary | ICD-10-CM

## 2017-07-01 DIAGNOSIS — M25562 Pain in left knee: Secondary | ICD-10-CM

## 2017-07-01 DIAGNOSIS — M25762 Osteophyte, left knee: Secondary | ICD-10-CM | POA: Diagnosis not present

## 2017-07-01 DIAGNOSIS — M1712 Unilateral primary osteoarthritis, left knee: Secondary | ICD-10-CM | POA: Insufficient documentation

## 2017-07-01 DIAGNOSIS — M25561 Pain in right knee: Secondary | ICD-10-CM | POA: Insufficient documentation

## 2017-07-01 DIAGNOSIS — E119 Type 2 diabetes mellitus without complications: Secondary | ICD-10-CM | POA: Diagnosis not present

## 2017-07-01 DIAGNOSIS — G8929 Other chronic pain: Secondary | ICD-10-CM

## 2017-07-01 DIAGNOSIS — E2839 Other primary ovarian failure: Secondary | ICD-10-CM

## 2017-07-01 DIAGNOSIS — E1169 Type 2 diabetes mellitus with other specified complication: Secondary | ICD-10-CM | POA: Diagnosis not present

## 2017-07-01 DIAGNOSIS — M25462 Effusion, left knee: Secondary | ICD-10-CM | POA: Insufficient documentation

## 2017-07-01 DIAGNOSIS — I10 Essential (primary) hypertension: Secondary | ICD-10-CM

## 2017-07-01 MED ORDER — METFORMIN HCL ER 500 MG PO TB24: ORAL_TABLET | ORAL | 3 refills | Status: DC

## 2017-07-01 MED ORDER — LISINOPRIL 40 MG PO TABS: 40.0000 mg | ORAL_TABLET | Freq: Every day | ORAL | 3 refills | Status: DC

## 2017-07-01 MED ORDER — CARVEDILOL 12.5 MG PO TABS: 12.5000 mg | ORAL_TABLET | Freq: Two times a day (BID) | ORAL | 3 refills | Status: DC

## 2017-07-01 MED ORDER — ATORVASTATIN CALCIUM 40 MG PO TABS: 40.0000 mg | ORAL_TABLET | Freq: Every day | ORAL | 3 refills | Status: DC

## 2017-07-01 MED ORDER — AMLODIPINE BESYLATE 10 MG PO TABS: 10.0000 mg | ORAL_TABLET | Freq: Every day | ORAL | 3 refills | Status: DC

## 2017-07-01 NOTE — Patient Instructions (Addendum)
Try some tylenol for your knee pain.  Let me know how helpful this is for you. I am going to get x-rays of your knees today We will also get a bone density scan to check on your bone strength in generaral   We will touch base about your knee films, and then can decide on the next step. If tylenol is not enough to control your pain we may want to consider having you see an orthopedist again to discuss other options.  There are other options besides surgery, of course!   You got your pneumonia booster shot today

## 2017-07-01 NOTE — Progress Notes (Addendum)
[facility] [address], [place], [place] [address] [phone]  Date:  07/01/2017   Name:  Teresa Poole   DOB:  1951-04-01   MRN:  [id number]  PCP:  Darreld Mclean, MD    Chief Complaint: Hyperlipidemia (Pt here for follow up Pt reports stomach is sore )   History of Present Illness:  Teresa Poole is a 65 y.o. very pleasant female patient who presents with the following:  Here today for a periodic re-check.  I last saw her in April to establish care:  Here today to establish care- she had been a pt of Dr. Everlene Farrier before he retired.  History of DM, HTN, CKD3 She sees Dr. Dwyane Dee for her DM- she is on trulicity, metformin  RecentLabs       Lab Results  Component Value Date   HGBA1C 6.6 12/01/2016     Einar Gip is her cardiologist.  She has a history of CHF.  Update health maint today: NOM:VEHM, Dr. Dwyane Dee Tetanus:  She has no idea per her report Pneumonia vaccine: she is due for prevnar booster today Mammo:  Declines to do  Dexa:  Will arrange for her Colon:  Pt reports done a few months ago, she cannot remember who did her colonoscopy  Eye exam: done 8- 9 months ago, she will be seen again in January   She did not take her BP meds today- she is on amlodipine, coreg, lisinopril lipitor for her cholesterol  trulicity and metformin Asa 81  She saw Dr. Dwyane Dee on 10/1 to check on her DM, do her flu shot She did get sick a few days ago after eating she thinks some bad food- she threw up and then felt better She is overall better, but still feels a bit off in her stomach  She is working on losing weight- she has lost 5 or so lbs   She notes that she has bad knees, she saw ortho years ago and they recommended a knee replacement but she has not wanted to do this.   She would estimate no x-rays of her knees in 10 years or so She did have a knee injection at some point years ago  Wt Readings from Last  3 Encounters:  07/01/17 195 lb 6.4 oz (88.6 kg)  05/24/17 201 lb (91.2 kg)  03/04/17 201 lb 12.8 oz (91.5 kg)     Lab Results  Component Value Date   HGBA1C 7.1 (H) 05/19/2017     Patient Active Problem List   Diagnosis Date Noted  . Mixed hyperlipidemia 10/26/2013  . Chronic combined systolic and diastolic heart failure ([place]) 09/28/2013  . CKD (chronic kidney disease), stage III ([place]) 09/24/2013  . Cardiomyopathy, hypertensive ([place]) 09/18/2013  . Edema 09/14/2013  . Acute combined systolic and diastolic congestive heart failure ([place]) 09/09/2013  . Hypertensive heart disease with CHF (congestive heart failure) ([place]) 09/09/2013  . Uncontrolled hypertension 09/07/2013  . Diabetes mellitus type II, uncontrolled ([place]) 09/07/2013  . Obesity (BMI 30-39.9)   . History of ovarian cancer     Past Medical History:  Diagnosis Date  . Allergy   . Anemia   . CAD (coronary artery disease)    non-obstructive by LHC (09/25/2013): Proximal and mid LAD serial 20%, proximal circumflex 30%, mid AV groove circumflex 30%, mid RCA mild plaque.  . Cancer ([place])   . Carotid stenosis    a. Carotid US (09/26/2011):  Bilateral 1-39% ICA.  Marland Kitchen Chronic combined systolic and diastolic CHF (congestive heart failure) ([place])   . CKD (chronic kidney disease)   . Diabetes mellitus without complication ([place])   . GERD (gastroesophageal reflux disease)   . History of ovarian cancer   . Hyperlipidemia   . Hypertension   . NICM (nonischemic cardiomyopathy) ([place])    a. Echocardiogram (09/08/2013): Mild LVH, EF 30-35%, no WMA, mild LAE.  Marland Kitchen Obesity (BMI 30-39.9)   . Thyroid disease    Seen by specialist    No past surgical history on file.  Social History   Tobacco Use  . Smoking status: Never Smoker  . Smokeless tobacco: Never Used  Substance Use Topics  . Alcohol use: No  . Drug use: No    Family History  Problem Relation Age of Onset  . Diabetes Father     No Known Allergies  Medication list has  been reviewed and updated.  Current Outpatient Medications on File Prior to Visit  Medication Sig Dispense Refill  . aspirin EC 81 MG EC tablet Take 1 tablet (81 mg total) by mouth daily. 90 tablet 0  . Dulaglutide (TRULICITY) 5.03 TW/6.5KC SOPN INJECT ONE PEN INTO THE ABDOMINAL SKIN AS DIRECTED EVERY WEEK 6 mL 3  . glucose blood (ONE TOUCH ULTRA TEST) test strip Use to check blood sugar 3 times daily Dx code E11.65 100 each 3  . glucose blood (ONETOUCH VERIO) test strip Use to check blood sugar 3 times daily Dx code E11.65 200 each 5   No current facility-administered medications on file prior to visit.     Review of Systems:  As per HPI- otherwise negative. No fever or chills No rash No ST or cough   Physical Examination: Vitals:   07/01/17 1250 07/01/17 1257  BP: (!) 188/90 (!) 190/100  Pulse:  83  Resp:  16  Temp:  98.4 F (36.9 C)  SpO2:  99%   Vitals:   07/01/17 1257  Weight: 195 lb 6.4 oz (88.6 kg)   Body mass index is 31.54 kg/m. Ideal Body Weight:    GEN: WDWN, NAD, Non-toxic, A & O x 3, obese, looks well HEENT: Atraumatic, Normocephalic. Neck supple. No masses, No LAD.  Bilateral TM wnl, oropharynx normal.  PEERL,EOMI.   Ears and Nose: No external deformity. CV: RRR, No M/G/R. No JVD. No thrill. No extra heart sounds. PULM: CTA B, no wheezes, crackles, rhonchi. No retractions. No resp. distress. No accessory muscle use. ABD: S, NT, ND, +BS. No rebound. No HSM.  Benign belly EXTR: No c/c/e NEURO Normal gait.  PSYCH: Normally interactive. Conversant. Not depressed or anxious appearing.  Calm demeanor.  Crepitus of both knee joints   Assessment and Plan: Hyperlipidemia associated with type 2 diabetes mellitus ([place]) - Plan: atorvastatin (LIPITOR) 40 MG tablet  Essential hypertension - Plan: lisinopril (PRINIVIL,ZESTRIL) 40 MG tablet, amLODipine (NORVASC) 10 MG tablet, carvedilol (COREG) 12.5 MG tablet  Chronic renal disease, stage 3, moderately decreased  glomerular filtration rate between 30-59 mL/min/1.73 square meter (HCC)  Estrogen deficiency - Plan: DG Bone Density  Immunization due - Plan: Pneumococcal conjugate vaccine 13-valent IM  Controlled type 2 diabetes mellitus without complication, without long-term current use of insulin ([place]) - Plan: metFORMIN (GLUCOPHAGE-XR) 500 MG 24 hr tablet  Chronic pain of both knees - Plan: DG Knee Complete 4 Views Right, DG Knee Complete 4 Views Left  Here today for a follow-up visit Refilled medications for pt as above Ordered a dexa  scan She has complaint of bilateral knee pain for many years- saw ortho about 10 years ago.  She notes that knee pain limits her walking which is one of her favorite things to do.  We will get plain films today She is not taking any medication- recommended that she first try tylenol for her pain, let me know if not helpful  We can consider other medications vs having her see ortho again to discuss options prevnar boosted today- we forgot to do her tetanus, will do next time  BP was not on chart when I went to see next pt.  My CMA checked her BP as above- quite high.  Asked pt to stay and discuss with me but pt refused and left However she had not taken any of her BP meds this am- advised her to take her meds daily without fail during our visit   Signed Lamar Blinks, MD 11/9 Received her knee films and gave her a call.  She has quite severe OA.  She would like to see ortho- I will set this up  EXAM: RIGHT KNEE - COMPLETE 4+ VIEW; LEFT KNEE - COMPLETE 4+ VIEW  COMPARISON: Left knee series dated March 13, 2011  FINDINGS: Left knee: The bones are subjectively adequately mineralized. There is moderate narrowing of the medial and lateral joint compartments. There is beaking of the tibial spines. There is an osteophyte which arises from the anterior articular surface of the medial femoral condyle. Spurs arise from the articular margins of the femoral condyles and  tibial plateaus also. Large spurs arise from the articular margins of the patella. There is soft tissue calcification within the suprapatellar bursa. There is no acute fracture or dislocation.  Right knee: The bones are subjectively adequately mineralized. There is moderate narrowing of the medial and patellofemoral compartments. There is beaking of the tibial spines. Spurs arise from the articular margins of the femoral condyles and the lateral tibial plateau as well as from the patella. There is a small suprapatellar effusion. There is no acute fracture nor dislocation.  IMPRESSION: Moderate osteoarthritic change of all 3 joint compartments of both knees with exuberant osteophyte formation. There is a small suprapatellar effusion on the left. There is no acute fracture or Dislocation.

## 2017-07-02 NOTE — Addendum Note (Signed)
Addended by: Lamar Blinks C on: 07/02/2017 08:25 AM   Modules accepted: Orders

## 2017-07-05 ENCOUNTER — Ambulatory Visit (INDEPENDENT_AMBULATORY_CARE_PROVIDER_SITE_OTHER): Payer: BC Managed Care – PPO | Admitting: Orthopaedic Surgery

## 2017-07-05 ENCOUNTER — Encounter (INDEPENDENT_AMBULATORY_CARE_PROVIDER_SITE_OTHER): Payer: Self-pay | Admitting: Orthopaedic Surgery

## 2017-07-05 DIAGNOSIS — M1711 Unilateral primary osteoarthritis, right knee: Secondary | ICD-10-CM

## 2017-07-05 MED ORDER — DICLOFENAC SODIUM 1 % TD GEL: 2.0000 g | Freq: Four times a day (QID) | TRANSDERMAL | 5 refills | Status: DC

## 2017-07-05 NOTE — Progress Notes (Signed)
Office Visit Note   Patient: Teresa Poole           Date of Birth: [birth year]           MRN: [id number] Visit Date: 07/05/2017              Requested by: Darreld Mclean, MD [address] [place], [place] [address] PCP: Darreld Mclean, MD   Assessment & Plan: Visit Diagnoses:  1. Unilateral primary osteoarthritis, right knee     Plan: Impression is end-stage degenerative joint disease in bilateral knees.  Patient expresses significant frustration with difficulty in her ADLs and her quality of life due to her arthritis.  Given her multiple medical problems she is not a candidate for oral NSAIDs or cortisone injections.  She would like to proceed with a total knee replacement sometime next year likely in the summertime.  We discussed the risk benefits alternatives of surgery and she understands and she will give [place] a call when she is ready to schedule surgery.  She will need preoperative medical and cardiac clearance when the time comes.  Prescription for Voltaren gel.  Follow-Up Instructions: Return if symptoms worsen or fail to improve.   Orders:  No orders of the defined types were placed in this encounter.  Meds ordered this encounter  Medications  . diclofenac sodium (VOLTAREN) 1 % GEL    Sig: Apply 2 g 4 (four) times daily topically.    Dispense:  1 Tube    Refill:  5      Procedures: No procedures performed   Clinical Data: No additional findings.   Subjective: Chief Complaint  Patient presents with  . Left Knee - Pain  . Right Knee - Pain    Patient is a 66 year old female who comes in with right greater than left knee pain secondary to degenerative joint disease for many years.  She will lives on the second floor of apartment has significant difficulty with walking and ascending stairs as well as ADLs and night pain.  She does have a history of heart disease, diabetes, carotid stenosis, chronic kidney disease therefore she cannot take  NSAIDs.  She is upset that she cannot be more active due to her knees.  She reports giving way with her knees.  Pain does not radiate and denies any numbness and tingling.    Review of Systems  Constitutional: Negative.   HENT: Negative.   Eyes: Negative.   Respiratory: Negative.   Cardiovascular: Negative.   Endocrine: Negative.   Musculoskeletal: Negative.   Neurological: Negative.   Hematological: Negative.   Psychiatric/Behavioral: Negative.   All other systems reviewed and are negative.    Objective: Vital Signs: There were no vitals taken for this visit.  Physical Exam  Constitutional: She is oriented to person, place, and time. She appears well-developed and well-nourished.  HENT:  Head: Normocephalic and atraumatic.  Eyes: EOM are normal.  Neck: Neck supple.  Pulmonary/Chest: Effort normal.  Abdominal: Soft.  Neurological: She is alert and oriented to person, place, and time.  Skin: Skin is warm. Capillary refill takes less than 2 seconds.  Psychiatric: She has a normal mood and affect. Her behavior is normal. Judgment and thought content normal.  Nursing note and vitals reviewed.   Ortho Exam Bilateral knee exam shows no joint effusion.  Collaterals and cruciates are grossly intact.  Positive patellar crepitus.  Well-preserved joint motion Specialty Comments:  No specialty comments available.  Imaging: No  results found.   PMFS History: Patient Active Problem List   Diagnosis Date Noted  . Mixed hyperlipidemia 10/26/2013  . Chronic combined systolic and diastolic heart failure ([place]) 09/28/2013  . CKD (chronic kidney disease), stage III ([place]) 09/24/2013  . Cardiomyopathy, hypertensive ([place]) 09/18/2013  . Edema 09/14/2013  . Acute combined systolic and diastolic congestive heart failure ([place]) 09/09/2013  . Hypertensive heart disease with CHF (congestive heart failure) ([place]) 09/09/2013  . Uncontrolled hypertension 09/07/2013  . Diabetes mellitus type II,  uncontrolled ([place]) 09/07/2013  . Obesity (BMI 30-39.9)   . History of ovarian cancer    Past Medical History:  Diagnosis Date  . Allergy   . Anemia   . CAD (coronary artery disease)    non-obstructive by LHC (09/25/2013): Proximal and mid LAD serial 20%, proximal circumflex 30%, mid AV groove circumflex 30%, mid RCA mild plaque.  . Cancer ([place])   . Carotid stenosis    a. Carotid US (09/26/2011): Bilateral 1-39% ICA.  Marland Kitchen Chronic combined systolic and diastolic CHF (congestive heart failure) ([place])   . CKD (chronic kidney disease)   . Diabetes mellitus without complication ([place])   . GERD (gastroesophageal reflux disease)   . History of ovarian cancer   . Hyperlipidemia   . Hypertension   . NICM (nonischemic cardiomyopathy) ([place])    a. Echocardiogram (09/08/2013): Mild LVH, EF 30-35%, no WMA, mild LAE.  Marland Kitchen Obesity (BMI 30-39.9)   . Thyroid disease    Seen by specialist    Family History  Problem Relation Age of Onset  . Diabetes Father     History reviewed. No pertinent surgical history. Social History   Occupational History    Employer: [employer]  Tobacco Use  . Smoking status: Never Smoker  . Smokeless tobacco: Never Used  Substance and Sexual Activity  . Alcohol use: No  . Drug use: No  . Sexual activity: Not on file

## 2017-07-13 ENCOUNTER — Telehealth (INDEPENDENT_AMBULATORY_CARE_PROVIDER_SITE_OTHER): Payer: Self-pay | Admitting: Orthopaedic Surgery

## 2017-07-13 NOTE — Telephone Encounter (Signed)
Pt said she has been waiting 8 days to get her rx. [facility] told patient Dr. Erlinda Hong needs to send for an authorization from insurance company to fill her RX. Please advise- [phone]

## 2017-07-14 NOTE — Telephone Encounter (Signed)
PA done waiting for a response for approval or denial from ins they will send response via fax. Called Patient, patient aware.

## 2017-08-10 ENCOUNTER — Telehealth: Payer: Self-pay | Admitting: Family Medicine

## 2017-08-10 NOTE — Telephone Encounter (Signed)
Copied from [place] [id number]. Topic: Quick Communication - See Telephone Encounter >> Aug 10, 2017  3:57 PM Rosalin Hawking wrote: CRM for notification. See Telephone encounter for:  08/10/17.    Pt came in office stating had received a bill from [facility] that she owns on 3 xrays that was done on 07-01-2017, but pt mentioned that she was only supposed to have 2 type of xrays that Dr Lorelei Pont required her to have (xrays of her knee and Bone density order Only) Pt would like to have a letter from provider stating that pt was supposed to have only the knee xrays and bone density done (bone density because of her age was required) so that insurance bill will be taken care off the right way. Please advise pt when letter ready. (pt left a copy of bill issue at front desk)

## 2017-08-11 NOTE — Telephone Encounter (Signed)
Reached out to [place] and she will discuss with pt. I have her bill- I am also confused about the charges.  It may be showing the bone density scan in addition to her knee films?  Will give bill to [place] to review.  It is not clear what her charges are for.   Of note it also looks like an x-ray done by Dr. Erlinda Hong on 07/05/17 is on the list

## 2017-09-22 ENCOUNTER — Other Ambulatory Visit (INDEPENDENT_AMBULATORY_CARE_PROVIDER_SITE_OTHER): Payer: BC Managed Care – PPO

## 2017-09-22 DIAGNOSIS — E1165 Type 2 diabetes mellitus with hyperglycemia: Secondary | ICD-10-CM | POA: Diagnosis not present

## 2017-09-22 LAB — COMPREHENSIVE METABOLIC PANEL
ALBUMIN: 3.8 g/dL (ref 3.5–5.2)
ALT: 11 U/L (ref 0–35)
AST: 16 U/L (ref 0–37)
Alkaline Phosphatase: 52 U/L (ref 39–117)
BUN: 23 mg/dL (ref 6–23)
CALCIUM: 9.7 mg/dL (ref 8.4–10.5)
CO2: 29 mEq/L (ref 19–32)
CREATININE: 1.53 mg/dL — AB (ref 0.40–1.20)
Chloride: 103 mEq/L (ref 96–112)
GFR: 43.71 mL/min — AB (ref >60.00)
Glucose, Bld: 159 mg/dL — ABNORMAL HIGH (ref 70–99)
Potassium: 4.1 mEq/L (ref 3.5–5.1)
Sodium: 140 mEq/L (ref 135–145)
Total Bilirubin: 0.3 mg/dL (ref 0.2–1.2)
Total Protein: 7.2 g/dL (ref 6.0–8.3)

## 2017-09-22 LAB — LIPID PANEL
Cholesterol: 160 mg/dL (ref 0–200)
HDL: 50.4 mg/dL
LDL Cholesterol: 85 mg/dL (ref 0–99)
NonHDL: 109.77
Total CHOL/HDL Ratio: 3
Triglycerides: 126 mg/dL (ref 0.0–149.0)
VLDL: 25.2 mg/dL (ref 0.0–40.0)

## 2017-09-22 LAB — HEMOGLOBIN A1C: HEMOGLOBIN A1C: 7.2 % — AB (ref 4.6–6.5)

## 2017-09-24 ENCOUNTER — Other Ambulatory Visit: Payer: BC Managed Care – PPO

## 2017-09-27 ENCOUNTER — Encounter: Payer: Self-pay | Admitting: Endocrinology

## 2017-09-27 ENCOUNTER — Ambulatory Visit: Payer: BC Managed Care – PPO | Admitting: Endocrinology

## 2017-09-27 VITALS — BP 142/78 | HR 74 | Ht 66.0 in | Wt 197.4 lb

## 2017-09-27 DIAGNOSIS — E559 Vitamin D deficiency, unspecified: Secondary | ICD-10-CM | POA: Diagnosis not present

## 2017-09-27 DIAGNOSIS — E118 Type 2 diabetes mellitus with unspecified complications: Secondary | ICD-10-CM

## 2017-09-27 DIAGNOSIS — E782 Mixed hyperlipidemia: Secondary | ICD-10-CM | POA: Diagnosis not present

## 2017-09-27 LAB — POCT GLUCOSE (DEVICE FOR HOME USE): POC Glucose: 92 mg/dl (ref 70–99)

## 2017-09-27 NOTE — Progress Notes (Signed)
Patient ID: Teresa Poole, female   DOB: 22-Oct-1950, 67 y.o.   MRN: [id number]           Reason for Appointment: Follow-up for Type 2 Diabetes  Referring physician: Fulp  History of Present Illness:          Diagnosis: Type 2 diabetes mellitus, date of diagnosis: 2000        Past history: At diagnosis she was having fatigue and she was initially treated with metformin Metformin was stopped about 1-1/2 years ago when she decided not to take any medications. She was in the hospital in 08/2013 and her blood sugar was about 460 with A1c of 15. She was started on Lantus and NovoLog and has been continued on this regimen since then. Has not been back on any oral hypoglycemic drugs in the past year  Recent history:   Non-insulin hypoglycemic drugs the patient is taking are: Metformin 1 g, Trulicity 0.17 mg weekly  On her initial consultation she was started on Trulicity to help with her postprandial readings and she was able to stop insulin with this  Her A1c is minimally higher at 7.2, previous range 6.6-7.3    She did not bring her monitor for download again  Current management, blood sugar patterns and problems identified:  Her lab glucose was  159  She says that she checks her blood sugar mostly at work midmorning and these readings are not high usually  She does try to walk as much as she can and tries to do 30 minutes whenever she can unless there is bad weather or other reasons  Her weight is up 2 pounds but generally better than last year  She is still trying to watch her portions fairly well  Blood sugar today in the office was 92      Side effects from medications have been: GI distress with over 1000 mg metformin, Trulicity 1.5 mg causes decreased appetite Compliance with the medical regimen: Fair  Glucose monitoring:  by recall: 110-135   Glucometer: One Touch.     Self-care:     Meals:  2-3 meals per day. Breakfast is meat, egg, potatoes and sometimes  oatmeal/fruit.           Exercise: walking upto 30 min daily        Dietician visit, most recent: 3/16.               Weight history: Range 205-245  Wt Readings from Last 3 Encounters:  09/27/17 197 lb 6.4 oz (89.5 kg)  07/01/17 195 lb 6.4 oz (88.6 kg)  05/24/17 201 lb (91.2 kg)    Glycemic control:    Lab Results  Component Value Date   HGBA1C 7.2 (H) 09/22/2017   HGBA1C 7.1 (H) 05/19/2017   HGBA1C 7.3 (H) 03/02/2017   Lab Results  Component Value Date   MICROALBUR 58.8 (H) 09/08/2016   LDLCALC 85 09/22/2017   CREATININE 1.53 (H) 09/22/2017     Office Visit on 09/27/2017  Component Date Value Ref Range Status  . POC Glucose 09/27/2017 92  70 - 99 mg/dl Final  Lab on 09/22/2017  Component Date Value Ref Range Status  . Cholesterol 09/22/2017 160  0 - 200 mg/dL Final   ATP III Classification       Desirable:  < 200 mg/dL               Borderline High:  200 - 239 mg/dL  High:  > = 240 mg/dL  . Triglycerides 09/22/2017 126.0  0.0 - 149.0 mg/dL Final   Normal:  <150 mg/dLBorderline High:  150 - 199 mg/dL  . HDL 09/22/2017 50.40  >39.00 mg/dL Final  . VLDL 09/22/2017 25.2  0.0 - 40.0 mg/dL Final  . LDL Cholesterol 09/22/2017 85  0 - 99 mg/dL Final  . Total CHOL/HDL Ratio 09/22/2017 3   Final                  Men          Women1/2 Average Risk     3.4          3.3Average Risk          5.0          4.42X Average Risk          9.6          7.13X Average Risk          15.0          11.0                      . NonHDL 09/22/2017 109.77   Final   NOTE:  Non-HDL goal should be 30 mg/dL higher than patient's LDL goal (i.e. LDL goal of < 70 mg/dL, would have non-HDL goal of < 100 mg/dL)  . Sodium 09/22/2017 140  135 - 145 mEq/L Final  . Potassium 09/22/2017 4.1  3.5 - 5.1 mEq/L Final  . Chloride 09/22/2017 103  96 - 112 mEq/L Final  . CO2 09/22/2017 29  19 - 32 mEq/L Final  . Glucose, Bld 09/22/2017 159* 70 - 99 mg/dL Final  . BUN 09/22/2017 23  6 - 23 mg/dL Final  .  Creatinine, Ser 09/22/2017 1.53* 0.40 - 1.20 mg/dL Final  . Total Bilirubin 09/22/2017 0.3  0.2 - 1.2 mg/dL Final  . Alkaline Phosphatase 09/22/2017 52  39 - 117 U/L Final  . AST 09/22/2017 16  0 - 37 U/L Final  . ALT 09/22/2017 11  0 - 35 U/L Final  . Total Protein 09/22/2017 7.2  6.0 - 8.3 g/dL Final  . Albumin 09/22/2017 3.8  3.5 - 5.2 g/dL Final  . Calcium 09/22/2017 9.7  8.4 - 10.5 mg/dL Final  . GFR 09/22/2017 43.71* >60.00 mL/min Final  . Hgb A1c MFr Bld 09/22/2017 7.2* 4.6 - 6.5 % Final   Glycemic Control Guidelines for People with Diabetes:Non Diabetic:  <6%Goal of Therapy: <7%Additional Action Suggested:  >8%      Allergies as of 09/27/2017   No Known Allergies     Medication List        Accurate as of 09/27/17 11:59 PM. Always use your most recent med list.          amLODipine 10 MG tablet Commonly known as:  NORVASC Take 1 tablet (10 mg total) daily by mouth.   aspirin 81 MG EC tablet Take 1 tablet (81 mg total) by mouth daily.   atorvastatin 40 MG tablet Commonly known as:  LIPITOR Take 1 tablet (40 mg total) daily by mouth.   carvedilol 12.5 MG tablet Commonly known as:  COREG Take 1 tablet (12.5 mg total) 2 (two) times daily with a meal by mouth.   Dulaglutide 0.75 MG/0.5ML Sopn Commonly known as:  TRULICITY INJECT ONE PEN INTO THE ABDOMINAL SKIN AS DIRECTED EVERY WEEK   glucose blood test strip Commonly known as:  ONE TOUCH  ULTRA TEST Use to check blood sugar 3 times daily Dx code E11.65   glucose blood test strip Commonly known as:  ONETOUCH VERIO Use to check blood sugar 3 times daily Dx code E11.65   lisinopril 40 MG tablet Commonly known as:  PRINIVIL,ZESTRIL Take 1 tablet (40 mg total) daily by mouth.   metFORMIN 500 MG 24 hr tablet Commonly known as:  GLUCOPHAGE-XR TAKE 1 TABLET BY MOUTH EVERY MORNING AND 2 TABLETS BY MOUTH EVERY DAY WITH SUPPER       Allergies: No Known Allergies  Past Medical History:  Diagnosis Date  . Allergy     . Anemia   . CAD (coronary artery disease)    non-obstructive by LHC (09/25/2013): Proximal and mid LAD serial 20%, proximal circumflex 30%, mid AV groove circumflex 30%, mid RCA mild plaque.  . Cancer ([place])   . Carotid stenosis    a. Carotid US (09/26/2011): Bilateral 1-39% ICA.  Marland Kitchen Chronic combined systolic and diastolic CHF (congestive heart failure) ([place])   . CKD (chronic kidney disease)   . Diabetes mellitus without complication ([place])   . GERD (gastroesophageal reflux disease)   . History of ovarian cancer   . Hyperlipidemia   . Hypertension   . NICM (nonischemic cardiomyopathy) ([place])    a. Echocardiogram (09/08/2013): Mild LVH, EF 30-35%, no WMA, mild LAE.  Marland Kitchen Obesity (BMI 30-39.9)   . Thyroid disease    Seen by specialist    Past Surgical History:  Procedure Laterality Date  . LEFT HEART CATHETERIZATION WITH CORONARY ANGIOGRAM N/A 09/25/2013   Procedure: LEFT HEART CATHETERIZATION WITH CORONARY ANGIOGRAM;  Surgeon: Burnell Blanks, MD;  Location: [facility];  Service: Cardiovascular;  Laterality: N/A;    Family History  Problem Relation Age of Onset  . Diabetes Father     Social History:  reports that  has never smoked. she has never used smokeless tobacco. She reports that she does not drink alcohol or use drugs.    Review of Systems    She is complaining of intermittent feeling of nausea or indigestion but does not take any treatment for this  Thyroid: She has a 2.8 cm mixed echogenic predominantly solid nodule on the right side and a biopsy was benign        Lipids:  has been treated with Simvastatin 40 mg by PCP with good control       Lab Results  Component Value Date   CHOL 160 09/22/2017   HDL 50.40 09/22/2017   LDLCALC 85 09/22/2017   TRIG 126.0 09/22/2017   CHOLHDL 3 09/22/2017                   The blood pressure has been treated with carvedilol, amlodipine 10 mg and lisinopril 40 mg.      She tends to be anxious in the office and blood  pressure readings are variable   BP Readings from Last 3 Encounters:  09/27/17 (!) 142/78  07/01/17 (!) 190/100  05/24/17 (!) 150/80   Previous vitamin D level has been low  Lab Results  Component Value Date   VD25OH 21.69 (L) 12/21/2016      LABS:  Office Visit on 09/27/2017  Component Date Value Ref Range Status  . POC Glucose 09/27/2017 92  70 - 99 mg/dl Final  Lab on 09/22/2017  Component Date Value Ref Range Status  . Cholesterol 09/22/2017 160  0 - 200 mg/dL Final   ATP III Classification  Desirable:  < 200 mg/dL               Borderline High:  200 - 239 mg/dL          High:  > = 240 mg/dL  . Triglycerides 09/22/2017 126.0  0.0 - 149.0 mg/dL Final   Normal:  <150 mg/dLBorderline High:  150 - 199 mg/dL  . HDL 09/22/2017 50.40  >39.00 mg/dL Final  . VLDL 09/22/2017 25.2  0.0 - 40.0 mg/dL Final  . LDL Cholesterol 09/22/2017 85  0 - 99 mg/dL Final  . Total CHOL/HDL Ratio 09/22/2017 3   Final                  Men          Women1/2 Average Risk     3.4          3.3Average Risk          5.0          4.42X Average Risk          9.6          7.13X Average Risk          15.0          11.0                      . NonHDL 09/22/2017 109.77   Final   NOTE:  Non-HDL goal should be 30 mg/dL higher than patient's LDL goal (i.e. LDL goal of < 70 mg/dL, would have non-HDL goal of < 100 mg/dL)  . Sodium 09/22/2017 140  135 - 145 mEq/L Final  . Potassium 09/22/2017 4.1  3.5 - 5.1 mEq/L Final  . Chloride 09/22/2017 103  96 - 112 mEq/L Final  . CO2 09/22/2017 29  19 - 32 mEq/L Final  . Glucose, Bld 09/22/2017 159* 70 - 99 mg/dL Final  . BUN 09/22/2017 23  6 - 23 mg/dL Final  . Creatinine, Ser 09/22/2017 1.53* 0.40 - 1.20 mg/dL Final  . Total Bilirubin 09/22/2017 0.3  0.2 - 1.2 mg/dL Final  . Alkaline Phosphatase 09/22/2017 52  39 - 117 U/L Final  . AST 09/22/2017 16  0 - 37 U/L Final  . ALT 09/22/2017 11  0 - 35 U/L Final  . Total Protein 09/22/2017 7.2  6.0 - 8.3 g/dL Final  .  Albumin 09/22/2017 3.8  3.5 - 5.2 g/dL Final  . Calcium 09/22/2017 9.7  8.4 - 10.5 mg/dL Final  . GFR 09/22/2017 43.71* >60.00 mL/min Final  . Hgb A1c MFr Bld 09/22/2017 7.2* 4.6 - 6.5 % Final   Glycemic Control Guidelines for People with Diabetes:Non Diabetic:  <6%Goal of Therapy: <7%Additional Action Suggested:  >8%     Physical Examination:  BP (!) 142/78 (BP Location: Left Arm, Patient Position: Sitting, Cuff Size: Large)   Pulse 74   Ht 5\' 6"  (1.676 m)   Wt 197 lb 6.4 oz (89.5 kg)   SpO2 99%   BMI 31.86 kg/m       ASSESSMENT:  Diabetes type 2, non-insulin-dependent See history of present illness for detailed discussion of current diabetes management, blood sugar patterns and problems identified  Her A1c is slightly above 7% again although fingerstick blood sugars at home and office blood sugar do not appear to be significantly high How she is not doing her readings after lunch and dinner and not clear if she may be having higher readings at that time  She is doing well with exercise and walking regularly  A1c is reasonably good for her at 7.2  She has difficulty tolerating higher doses of metformin and Trulicity, she still has random episodes of vomiting  Episodes of nausea: May be related to Trulicity or also concomitant gastroesophageal reflux  LIPIDS: Well controlled  PLAN:   She will continue the same dose of Trulicity and metformin She will start to check more readings after lunch or dinner  Recommended that she can take Pepcid or Zantac for acid reflux and this may prevent some episodes of vomiting also   Hypertension: She needs to follow-up with her PCP regularly  Vitamin D supplementation: She needs to take vitamin D OTC separately and not just a multivitamin    Patient Instructions  Take pepcid daily and Vitamin D3     09/28/2017, 11:03 AM   Note: This office note was prepared with Estate agent. Any transcriptional  errors that result from this process are unintentional.

## 2017-09-27 NOTE — Patient Instructions (Signed)
Take pepcid daily and Vitamin D3

## 2018-01-21 ENCOUNTER — Other Ambulatory Visit (INDEPENDENT_AMBULATORY_CARE_PROVIDER_SITE_OTHER): Payer: BC Managed Care – PPO

## 2018-01-21 DIAGNOSIS — E559 Vitamin D deficiency, unspecified: Secondary | ICD-10-CM | POA: Diagnosis not present

## 2018-01-21 DIAGNOSIS — E118 Type 2 diabetes mellitus with unspecified complications: Secondary | ICD-10-CM

## 2018-01-21 DIAGNOSIS — E1165 Type 2 diabetes mellitus with hyperglycemia: Secondary | ICD-10-CM | POA: Diagnosis not present

## 2018-01-21 LAB — BASIC METABOLIC PANEL
BUN: 30 mg/dL — AB (ref 6–23)
CALCIUM: 9.6 mg/dL (ref 8.4–10.5)
CO2: 29 mEq/L (ref 19–32)
Chloride: 105 mEq/L (ref 96–112)
Creatinine, Ser: 1.7 mg/dL — ABNORMAL HIGH (ref 0.40–1.20)
GFR: 38.67 mL/min — AB (ref >60.00)
Glucose, Bld: 141 mg/dL — ABNORMAL HIGH (ref 70–99)
POTASSIUM: 5 meq/L (ref 3.5–5.1)
SODIUM: 140 meq/L (ref 135–145)

## 2018-01-21 LAB — MICROALBUMIN / CREATININE URINE RATIO
Creatinine,U: 110.5 mg/dL
MICROALB/CREAT RATIO: 80.8 mg/g — AB (ref 0.0–30.0)
Microalb, Ur: 89.3 mg/dL — ABNORMAL HIGH (ref 0.0–1.9)

## 2018-01-21 LAB — HEMOGLOBIN A1C: HEMOGLOBIN A1C: 7.2 % — AB (ref 4.6–6.5)

## 2018-01-21 LAB — VITAMIN D 25 HYDROXY (VIT D DEFICIENCY, FRACTURES): VITD: 34.89 ng/mL (ref 30.00–100.00)

## 2018-01-21 NOTE — Addendum Note (Signed)
Addended by: Ellamae Sia on: 01/21/2018 11:24 AM   Modules accepted: Orders

## 2018-01-25 ENCOUNTER — Ambulatory Visit: Payer: BC Managed Care – PPO | Admitting: Endocrinology

## 2018-01-25 ENCOUNTER — Encounter: Payer: Self-pay | Admitting: Endocrinology

## 2018-01-25 VITALS — BP 168/84 | HR 77 | Ht 66.0 in | Wt 206.8 lb

## 2018-01-25 DIAGNOSIS — N183 Chronic kidney disease, stage 3 unspecified: Secondary | ICD-10-CM

## 2018-01-25 DIAGNOSIS — E041 Nontoxic single thyroid nodule: Secondary | ICD-10-CM

## 2018-01-25 DIAGNOSIS — E1165 Type 2 diabetes mellitus with hyperglycemia: Secondary | ICD-10-CM

## 2018-01-25 DIAGNOSIS — I150 Renovascular hypertension: Secondary | ICD-10-CM

## 2018-01-25 NOTE — Patient Instructions (Addendum)
Walk daily upto 30 min  Check blood sugars on waking up  2/7 days  Also check blood sugars about 2 hours after a meal and do this after different meals by rotation especially LUNCH  Recommended blood sugar levels on waking up is 90-130 and about 2 hours after meal is 130-160  Please bring your blood sugar monitor to each visit, thank you  1 metformin daily

## 2018-01-25 NOTE — Progress Notes (Signed)
Patient ID: Teresa Poole, female   DOB: 1951-06-16, 67 y.o.   MRN: [id number]           Reason for Appointment: Follow-up for Type 2 Diabetes  Referring physician: Fulp  History of Present Illness:          Diagnosis: Type 2 diabetes mellitus, date of diagnosis: 2000        Past history: At diagnosis she was having fatigue and she was initially treated with metformin Metformin was stopped about 1-1/2 years ago when she decided not to take any medications. She was in the hospital in 08/2013 and her blood sugar was about 460 with A1c of 15. She was started on Lantus and NovoLog and has been continued on this regimen since then. Has not been back on any oral hypoglycemic drugs in the past year  Recent history:   Non-insulin hypoglycemic drugs the patient is taking are: Metformin 1 g, Trulicity 8.41 mg weekly  On her initial consultation she was started on Trulicity to help with her postprandial readings and she was able to stop insulin with this  Her A1c is the same at 7.2, previous range 6.6-7.3   Current management, blood sugar patterns and problems identified:  She again did not bring her monitor for download today  She has gained a significant amount of weight since her last visit and this is likely to be from her not planning her meals well and eating without planning especially during the day when not at home  Although she probably takes more sugars at home she is eating her main meal at lunchtime and not checking readings in the afternoon  She was trying to walk previously and now making various excuses for not doing so  No side effects from Trulicity  Her weight is up 9 pounds   Continues to take metformin 1000 mg daily although renal function is worse now  Blood sugar in the lab was 141      Side effects from medications have been: GI distress with over 1000 mg metformin, Trulicity 1.5 mg causes decreased appetite Compliance with the medical regimen:  Fair  Glucose monitoring:  by recall: 110-140   Glucometer: One Touch.     Self-care:      Meals:  2-3 meals per day. Breakfast is meat, egg, potatoes and sometimes oatmeal/fruit.           Exercise: was walking upto 30 min daily, none recently        Dietician visit, most recent: 3/16.               Weight history: Range 205-245  Wt Readings from Last 3 Encounters:  01/25/18 206 lb 12.8 oz (93.8 kg)  09/27/17 197 lb 6.4 oz (89.5 kg)  07/01/17 195 lb 6.4 oz (88.6 kg)    Glycemic control:    Lab Results  Component Value Date   HGBA1C 7.2 (H) 01/21/2018   HGBA1C 7.2 (H) 09/22/2017   HGBA1C 7.1 (H) 05/19/2017   Lab Results  Component Value Date   MICROALBUR 89.3 (H) 01/21/2018   LDLCALC 85 09/22/2017   CREATININE 1.70 (H) 01/21/2018     Lab on 01/21/2018  Component Date Value Ref Range Status  . VITD 01/21/2018 34.89  30.00 - 100.00 ng/mL Final  . Sodium 01/21/2018 140  135 - 145 mEq/L Final  . Potassium 01/21/2018 5.0  3.5 - 5.1 mEq/L Final  . Chloride 01/21/2018 105  96 - 112 mEq/L Final  .  CO2 01/21/2018 29  19 - 32 mEq/L Final  . Glucose, Bld 01/21/2018 141* 70 - 99 mg/dL Final  . BUN 01/21/2018 30* 6 - 23 mg/dL Final  . Creatinine, Ser 01/21/2018 1.70* 0.40 - 1.20 mg/dL Final  . Calcium 01/21/2018 9.6  8.4 - 10.5 mg/dL Final  . GFR 01/21/2018 38.67* >60.00 mL/min Final  . Hgb A1c MFr Bld 01/21/2018 7.2* 4.6 - 6.5 % Final   Glycemic Control Guidelines for People with Diabetes:Non Diabetic:  <6%Goal of Therapy: <7%Additional Action Suggested:  >8%   . Microalb, Ur 01/21/2018 89.3* 0.0 - 1.9 mg/dL Final  . Creatinine,U 01/21/2018 110.5  mg/dL Final  . Microalb Creat Ratio 01/21/2018 80.8* 0.0 - 30.0 mg/g Final     Allergies as of 01/25/2018   No Known Allergies     Medication List        Accurate as of 01/25/18  4:47 PM. Always use your most recent med list.          amLODipine 10 MG tablet Commonly known as:  NORVASC Take 1 tablet (10 mg total)  daily by mouth.   aspirin 81 MG EC tablet Take 1 tablet (81 mg total) by mouth daily.   atorvastatin 40 MG tablet Commonly known as:  LIPITOR Take 1 tablet (40 mg total) daily by mouth.   carvedilol 12.5 MG tablet Commonly known as:  COREG Take 1 tablet (12.5 mg total) 2 (two) times daily with a meal by mouth.   Dulaglutide 0.75 MG/0.5ML Sopn Commonly known as:  TRULICITY INJECT ONE PEN INTO THE ABDOMINAL SKIN AS DIRECTED EVERY WEEK   glucose blood test strip Commonly known as:  ONE TOUCH ULTRA TEST Use to check blood sugar 3 times daily Dx code E11.65   glucose blood test strip Commonly known as:  ONETOUCH VERIO Use to check blood sugar 3 times daily Dx code E11.65   lisinopril 40 MG tablet Commonly known as:  PRINIVIL,ZESTRIL Take 1 tablet (40 mg total) daily by mouth.   metFORMIN 500 MG 24 hr tablet Commonly known as:  GLUCOPHAGE-XR TAKE 1 TABLET BY MOUTH EVERY MORNING AND 2 TABLETS BY MOUTH EVERY DAY WITH SUPPER       Allergies: No Known Allergies  Past Medical History:  Diagnosis Date  . Allergy   . Anemia   . CAD (coronary artery disease)    non-obstructive by LHC (09/25/2013): Proximal and mid LAD serial 20%, proximal circumflex 30%, mid AV groove circumflex 30%, mid RCA mild plaque.  . Cancer ([place])   . Carotid stenosis    a. Carotid US (09/26/2011): Bilateral 1-39% ICA.  Marland Kitchen Chronic combined systolic and diastolic CHF (congestive heart failure) ([place])   . CKD (chronic kidney disease)   . Diabetes mellitus without complication ([place])   . GERD (gastroesophageal reflux disease)   . History of ovarian cancer   . Hyperlipidemia   . Hypertension   . NICM (nonischemic cardiomyopathy) ([place])    a. Echocardiogram (09/08/2013): Mild LVH, EF 30-35%, no WMA, mild LAE.  Marland Kitchen Obesity (BMI 30-39.9)   . Thyroid disease    Seen by specialist    Past Surgical History:  Procedure Laterality Date  . LEFT HEART CATHETERIZATION WITH CORONARY ANGIOGRAM N/A 09/25/2013   Procedure:  LEFT HEART CATHETERIZATION WITH CORONARY ANGIOGRAM;  Surgeon: Burnell Blanks, MD;  [facility] C[facility];  Service: Cardiovascular;  Laterality: N/A;    Family History  Problem Relation Age of Onset  . Diabetes Father  Social History:  reports that she has never smoked. She has never used smokeless tobacco. She reports that she does not drink alcohol or use drugs.    Review of Systems    Thyroid: She has a 2.8 cm mixed echogenic predominantly solid nodule on the right side and a biopsy was benign        Lipids:  has been treated with Simvastatin 40 mg by PCP with good control       Lab Results  Component Value Date   CHOL 160 09/22/2017   HDL 50.40 09/22/2017   LDLCALC 85 09/22/2017   TRIG 126.0 09/22/2017   CHOLHDL 3 09/22/2017                   The blood pressure has been treated with carvedilol, amlodipine 10 mg and lisinopril 40 mg. Appears to be inconsistently controlled and higher today    BP Readings from Last 3 Encounters:  01/25/18 (!) 168/84  09/27/17 (!) 142/78  07/01/17 (!) 190/100   RENAL dysfunction Has not seen nephrologist for some time  Lab Results  Component Value Date   CREATININE 1.70 (H) 01/21/2018   CREATININE 1.53 (H) 09/22/2017   CREATININE 1.37 (H) 05/19/2017    Renal artery ultrasound done in 2016 showed the following: Findings worrisome for a potential hemodynamically significant stenosis involving the origin and mid aspects of the left renal artery. Further evaluation with CTA could be performed as clinically indicated.  Previous vitamin D level has been low, currently on vitamin D3, 2000 UNITS  Lab Results  Component Value Date   VD25OH 34.89 01/21/2018   VD25OH 21.69 (L) 12/21/2016      LABS:  Lab on 01/21/2018  Component Date Value Ref Range Status  . VITD 01/21/2018 34.89  30.00 - 100.00 ng/mL Final  . Sodium 01/21/2018 140  135 - 145 mEq/L Final  . Potassium 01/21/2018 5.0  3.5 - 5.1 mEq/L Final  .  Chloride 01/21/2018 105  96 - 112 mEq/L Final  . CO2 01/21/2018 29  19 - 32 mEq/L Final  . Glucose, Bld 01/21/2018 141* 70 - 99 mg/dL Final  . BUN 01/21/2018 30* 6 - 23 mg/dL Final  . Creatinine, Ser 01/21/2018 1.70* 0.40 - 1.20 mg/dL Final  . Calcium 01/21/2018 9.6  8.4 - 10.5 mg/dL Final  . GFR 01/21/2018 38.67* >60.00 mL/min Final  . Hgb A1c MFr Bld 01/21/2018 7.2* 4.6 - 6.5 % Final   Glycemic Control Guidelines for People with Diabetes:Non Diabetic:  <6%Goal of Therapy: <7%Additional Action Suggested:  >8%   . Microalb, Ur 01/21/2018 89.3* 0.0 - 1.9 mg/dL Final  . Creatinine,U 01/21/2018 110.5  mg/dL Final  . Microalb Creat Ratio 01/21/2018 80.8* 0.0 - 30.0 mg/g Final    Physical Examination:  BP (!) 168/84 (BP Location: Left Arm, Patient Position: Sitting, Cuff Size: Normal)   Pulse 77   Ht 5\' 6"  (1.676 m)   Wt 206 lb 12.8 oz (93.8 kg)   SpO2 99%   BMI 33.38 kg/m       ASSESSMENT:  Diabetes type 2, non-insulin-dependent See history of present illness for discussion of current diabetes management, blood sugar patterns and problems identified  Her A1c is slightly above 7% again   A1c is mildly increased but not  clear if she has consistently high readings as information is not available Nonfasting lab glucose was 141 As discussed above she can make significant lifestyle changes especially with her recent weight gain and this  was discussed   Hypertension and worsening renal function: She needs to follow-up with her nephrologist although she probably has not been seen in quite some time and not clear which physician she went to and will need a new consultation done Also previous evaluation from 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 and this may be causing renal dysfunction   PLAN:   She will continue the same dose of Trulicity However states to start planning her meals better and avoid high fat foods, to have less fast food and eating on the run and also  moderation of total calorie intake Consider consultation with dietitian Discussed blood sugar targets at various times  She will start to check more readings after lunch and keep her meter at work so that she can bring it to the office for download next time She will need to start walking during her lunch hour for exercise, needs weight loss  Hypertension management: See above  Vitamin D supplementation: This is currently adequately replaced and she will continue supplement  History of thyroid nodule: Will recheck her level on the next visit of TSH   Total visit time for evaluation and management of multiple problems and counseling =25 minutes   Patient Instructions  Walk daily upto 30 min  Check blood sugars on waking up  2/7 days  Also check blood sugars about 2 hours after a meal and do this after different meals by rotation especially LUNCH  Recommended blood sugar levels on waking up is 90-130 and about 2 hours after meal is 130-160  Please bring your blood sugar monitor to each visit, thank you  1 metformin daily     01/25/2018, 4:47 PM   Note: This office note was prepared with Dragon voice recognition system technology. Any transcriptional errors that result from this process are unintentional.

## 2018-03-17 ENCOUNTER — Other Ambulatory Visit: Payer: Self-pay | Admitting: Endocrinology

## 2018-03-29 ENCOUNTER — Other Ambulatory Visit: Payer: Self-pay | Admitting: Endocrinology

## 2018-04-27 ENCOUNTER — Ambulatory Visit: Payer: BC Managed Care – PPO | Admitting: Endocrinology

## 2018-05-03 ENCOUNTER — Other Ambulatory Visit: Payer: BC Managed Care – PPO

## 2018-05-04 ENCOUNTER — Other Ambulatory Visit (INDEPENDENT_AMBULATORY_CARE_PROVIDER_SITE_OTHER): Payer: BC Managed Care – PPO

## 2018-05-04 DIAGNOSIS — E041 Nontoxic single thyroid nodule: Secondary | ICD-10-CM | POA: Diagnosis not present

## 2018-05-04 DIAGNOSIS — E1165 Type 2 diabetes mellitus with hyperglycemia: Secondary | ICD-10-CM

## 2018-05-04 LAB — LIPID PANEL
Cholesterol: 141 mg/dL (ref 0–200)
HDL: 54.2 mg/dL (ref >39.00)
LDL CALC: 72 mg/dL (ref 0–99)
NONHDL: 86.71
Total CHOL/HDL Ratio: 3
Triglycerides: 75 mg/dL (ref 0.0–149.0)
VLDL: 15 mg/dL (ref 0.0–40.0)

## 2018-05-04 LAB — COMPREHENSIVE METABOLIC PANEL
ALT: 13 U/L (ref 0–35)
AST: 19 U/L (ref 0–37)
Albumin: 3.8 g/dL (ref 3.5–5.2)
Alkaline Phosphatase: 41 U/L (ref 39–117)
BUN: 38 mg/dL — AB (ref 6–23)
CHLORIDE: 106 meq/L (ref 96–112)
CO2: 26 mEq/L (ref 19–32)
Calcium: 9.2 mg/dL (ref 8.4–10.5)
Creatinine, Ser: 1.66 mg/dL — ABNORMAL HIGH (ref 0.40–1.20)
GFR: 39.71 mL/min — AB (ref >60.00)
GLUCOSE: 115 mg/dL — AB (ref 70–99)
POTASSIUM: 4.6 meq/L (ref 3.5–5.1)
SODIUM: 139 meq/L (ref 135–145)
Total Bilirubin: 0.4 mg/dL (ref 0.2–1.2)
Total Protein: 7 g/dL (ref 6.0–8.3)

## 2018-05-04 LAB — TSH: TSH: 1.61 u[IU]/mL (ref 0.35–4.50)

## 2018-05-04 LAB — HEMOGLOBIN A1C: Hgb A1c MFr Bld: 6.6 % — ABNORMAL HIGH (ref 4.6–6.5)

## 2018-05-05 ENCOUNTER — Ambulatory Visit: Payer: BC Managed Care – PPO | Admitting: Endocrinology

## 2018-05-05 ENCOUNTER — Encounter: Payer: Self-pay | Admitting: Endocrinology

## 2018-05-05 VITALS — BP 168/72 | HR 67 | Ht 66.0 in | Wt 193.0 lb

## 2018-05-05 DIAGNOSIS — N183 Chronic kidney disease, stage 3 unspecified: Secondary | ICD-10-CM

## 2018-05-05 DIAGNOSIS — Z23 Encounter for immunization: Secondary | ICD-10-CM

## 2018-05-05 DIAGNOSIS — E118 Type 2 diabetes mellitus with unspecified complications: Secondary | ICD-10-CM

## 2018-05-05 DIAGNOSIS — I150 Renovascular hypertension: Secondary | ICD-10-CM

## 2018-05-05 NOTE — Patient Instructions (Signed)
Check blood sugars on waking up  2-3/7  Also check blood sugars about 2 hours after a meal and do this after different meals by rotation  Recommended blood sugar levels on waking up is 90-130 and about 2 hours after meal is 130-160  Please bring your blood sugar monitor to each visit, thank you  See [facility]

## 2018-05-05 NOTE — Progress Notes (Signed)
Patient ID: Teresa Poole, female   DOB: 1951-06-21, 67 y.o.   MRN: [id number]           Reason for Appointment: Follow-up for Type 2 Diabetes  Referring physician: Fulp  History of Present Illness:          Diagnosis: Type 2 diabetes mellitus, date of diagnosis: 2000        Past history: At diagnosis she was having fatigue and she was initially treated with metformin Metformin was stopped about 1-1/2 years ago when she decided not to take any medications. She was in the hospital in 08/2013 and her blood sugar was about 460 with A1c of 15. She was started on Lantus and NovoLog and has been continued on this regimen since then. Has not been back on any oral hypoglycemic drugs in the past year  Recent history:   Non-insulin hypoglycemic drugs the patient is taking are: Metformin 1 g, Trulicity 6.16 mg weekly  On her initial consultation she was started on Trulicity to help with her postprandial readings and she was able to stop insulin with this  Her A1c is the same at 7.2, previous range 6.6-7.3   Current management, blood sugar patterns and problems identified:  She has been much more motivated to watch her diet since her last visit and has lost 13 pounds  This is mostly from her cutting out sweets, sweet drinks, fruit drinks and avoiding as many snacks especially at work  Also she is trying to move around and be more active both at work and outside  Has also been trying to take her metformin and Trulicity without any side effects  Metformin dose limited by her renal function  Although she is checking her blood sugars fairly regularly she is doing these only in the morning now and most of the readings are excellent      Side effects from medications have been: GI distress with over 1000 mg metformin, Trulicity 1.5 mg causes decreased appetite Compliance with the medical regimen: Fair  Glucose monitoring:  With One Touch Verio, checking once a day   PRE-MEAL Fasting  Lunch Dinner Bedtime Overall  Glucose range: 90-140      Mean/median:      116     Self-care:      Meals:  2-3 meals per day. Breakfast is meat, egg, potatoes and sometimes oatmeal/fruit.           Exercise: walking at work, at parks        Dietician visit, most recent: 3/16.               Weight history: Previous range 205-245  Wt Readings from Last 3 Encounters:  05/05/18 193 lb (87.5 kg)  01/25/18 206 lb 12.8 oz (93.8 kg)  09/27/17 197 lb 6.4 oz (89.5 kg)    Glycemic control:    Lab Results  Component Value Date   HGBA1C 6.6 (H) 05/04/2018   HGBA1C 7.2 (H) 01/21/2018   HGBA1C 7.2 (H) 09/22/2017   Lab Results  Component Value Date   MICROALBUR 89.3 (H) 01/21/2018   LDLCALC 72 05/04/2018   CREATININE 1.66 (H) 05/04/2018     Lab on 05/04/2018  Component Date Value Ref Range Status  . TSH 05/04/2018 1.61  0.35 - 4.50 uIU/mL Final  . Cholesterol 05/04/2018 141  0 - 200 mg/dL Final   ATP III Classification       Desirable:  < 200 mg/dL  Borderline High:  200 - 239 mg/dL          High:  > = 240 mg/dL  . Triglycerides 05/04/2018 75.0  0.0 - 149.0 mg/dL Final   Normal:  <150 mg/dLBorderline High:  150 - 199 mg/dL  . HDL 05/04/2018 54.20  >39.00 mg/dL Final  . VLDL 05/04/2018 15.0  0.0 - 40.0 mg/dL Final  . LDL Cholesterol 05/04/2018 72  0 - 99 mg/dL Final  . Total CHOL/HDL Ratio 05/04/2018 3   Final                  Men          Women1/2 Average Risk     3.4          3.3Average Risk          5.0          4.42X Average Risk          9.6          7.13X Average Risk          15.0          11.0                      . NonHDL 05/04/2018 86.71   Final   NOTE:  Non-HDL goal should be 30 mg/dL higher than patient's LDL goal (i.e. LDL goal of < 70 mg/dL, would have non-HDL goal of < 100 mg/dL)  . Sodium 05/04/2018 139  135 - 145 mEq/L Final  . Potassium 05/04/2018 4.6  3.5 - 5.1 mEq/L Final  . Chloride 05/04/2018 106  96 - 112 mEq/L Final  . CO2 05/04/2018 26  19 -  32 mEq/L Final  . Glucose, Bld 05/04/2018 115* 70 - 99 mg/dL Final  . BUN 05/04/2018 38* 6 - 23 mg/dL Final  . Creatinine, Ser 05/04/2018 1.66* 0.40 - 1.20 mg/dL Final  . Total Bilirubin 05/04/2018 0.4  0.2 - 1.2 mg/dL Final  . Alkaline Phosphatase 05/04/2018 41  39 - 117 U/L Final  . AST 05/04/2018 19  0 - 37 U/L Final  . ALT 05/04/2018 13  0 - 35 U/L Final  . Total Protein 05/04/2018 7.0  6.0 - 8.3 g/dL Final  . Albumin 05/04/2018 3.8  3.5 - 5.2 g/dL Final  . Calcium 05/04/2018 9.2  8.4 - 10.5 mg/dL Final  . GFR 05/04/2018 39.71* >60.00 mL/min Final  . Hgb A1c MFr Bld 05/04/2018 6.6* 4.6 - 6.5 % Final   Glycemic Control Guidelines for People with Diabetes:Non Diabetic:  <6%Goal of Therapy: <7%Additional Action Suggested:  >8%      Allergies as of 05/05/2018   No Known Allergies     Medication List        Accurate as of 05/05/18  3:54 PM. Always use your most recent med list.          amLODipine 10 MG tablet Commonly known as:  NORVASC Take 1 tablet (10 mg total) daily by mouth.   aspirin 81 MG EC tablet Take 1 tablet (81 mg total) by mouth daily.   atorvastatin 40 MG tablet Commonly known as:  LIPITOR Take 1 tablet (40 mg total) daily by mouth.   carvedilol 12.5 MG tablet Commonly known as:  COREG Take 1 tablet (12.5 mg total) 2 (two) times daily with a meal by mouth.   glucose blood test strip Use to check blood sugar 3 times daily Dx code E11.65  ONETOUCH VERIO test strip Generic drug:  glucose blood CHECK BLOOD SUGAR THREE TIMES DAILY   lisinopril 40 MG tablet Commonly known as:  PRINIVIL,ZESTRIL Take 1 tablet (40 mg total) daily by mouth.   metFORMIN 500 MG 24 hr tablet Commonly known as:  GLUCOPHAGE-XR TAKE 1 TABLET BY MOUTH EVERY MORNING AND 2 TABLETS BY MOUTH EVERY DAY WITH SUPPER   TRULICITY 6.56 CL/2.7NT Sopn Generic drug:  Dulaglutide INJECT 1 PEN INTO THE ABDOMINAL SKIN AS DIRECTED EVERY WEEK       Allergies: No Known Allergies  Past  Medical History:  Diagnosis Date  . Allergy   . Anemia   . CAD (coronary artery disease)    non-obstructive by LHC (09/25/2013): Proximal and mid LAD serial 20%, proximal circumflex 30%, mid AV groove circumflex 30%, mid RCA mild plaque.  . Cancer ([place])   . Carotid stenosis    a. Carotid US (09/26/2011): Bilateral 1-39% ICA.  Marland Kitchen Chronic combined systolic and diastolic CHF (congestive heart failure) ([place])   . CKD (chronic kidney disease)   . Diabetes mellitus without complication ([place])   . GERD (gastroesophageal reflux disease)   . History of ovarian cancer   . Hyperlipidemia   . Hypertension   . NICM (nonischemic cardiomyopathy) ([place])    a. Echocardiogram (09/08/2013): Mild LVH, EF 30-35%, no WMA, mild LAE.  Marland Kitchen Obesity (BMI 30-39.9)   . Thyroid disease    Seen by specialist    Past Surgical History:  Procedure Laterality Date  . LEFT HEART CATHETERIZATION WITH CORONARY ANGIOGRAM N/A 09/25/2013   Procedure: LEFT HEART CATHETERIZATION WITH CORONARY ANGIOGRAM;  Surgeon: Burnell Blanks, MD;  Location: [facility] CATH LAB;  Service: Cardiovascular;  Laterality: N/A;    Family History  Problem Relation Age of Onset  . Diabetes Father     Social History:  reports that she has never smoked. She has never used smokeless tobacco. She reports that she does not drink alcohol or use drugs.    Review of Systems    Thyroid: She has a 2.8 cm mixed echogenic predominantly solid nodule on the right side and a biopsy was benign   Thyroid levels have been normal  Lab Results  Component Value Date   TSH 1.61 05/04/2018   TSH 1.66 12/21/2016   TSH 1.07 12/20/2014          Lipids:  has been treated with Simvastatin 40 mg by PCP with good control       Lab Results  Component Value Date   CHOL 141 05/04/2018   HDL 54.20 05/04/2018   LDLCALC 72 05/04/2018   TRIG 75.0 05/04/2018   CHOLHDL 3 05/04/2018                   The blood pressure has been treated with carvedilol, amlodipine 10 mg  and lisinopril 40 mg. Prescriptions are done by her PCP but appears that she has not been seen for several months Also she has not been seen by her nephrologist for couple of years Appears to be poorly controlled    Renal artery ultrasound done in 2016 showed the following: Findings worrisome for a potential hemodynamically significant stenosis involving the origin and mid aspects of the left renal artery. Further evaluation with CTA could be performed as clinically indicated.  BP Readings from Last 3 Encounters:  05/05/18 (!) 168/72  01/25/18 (!) 168/84  09/27/17 (!) 142/78   RENAL dysfunction stable:   Lab Results  Component Value Date  CREATININE 1.66 (H) 05/04/2018   CREATININE 1.70 (H) 01/21/2018   CREATININE 1.53 (H) 09/22/2017    Previous vitamin D level has been low, currently on vitamin D3, 2000 UNITS  Lab Results  Component Value Date   VD25OH 34.89 01/21/2018   VD25OH 21.69 (L) 12/21/2016      LABS:  Lab on 05/04/2018  Component Date Value Ref Range Status  . TSH 05/04/2018 1.61  0.35 - 4.50 uIU/mL Final  . Cholesterol 05/04/2018 141  0 - 200 mg/dL Final   ATP III Classification       Desirable:  < 200 mg/dL               Borderline High:  200 - 239 mg/dL          High:  > = 240 mg/dL  . Triglycerides 05/04/2018 75.0  0.0 - 149.0 mg/dL Final   Normal:  <150 mg/dLBorderline High:  150 - 199 mg/dL  . HDL 05/04/2018 54.20  >39.00 mg/dL Final  . VLDL 05/04/2018 15.0  0.0 - 40.0 mg/dL Final  . LDL Cholesterol 05/04/2018 72  0 - 99 mg/dL Final  . Total CHOL/HDL Ratio 05/04/2018 3   Final                  Men          Women1/2 Average Risk     3.4          3.3Average Risk          5.0          4.42X Average Risk          9.6          7.13X Average Risk          15.0          11.0                      . NonHDL 05/04/2018 86.71   Final   NOTE:  Non-HDL goal should be 30 mg/dL higher than patient's LDL goal (i.e. LDL goal of < 70 mg/dL, would have non-HDL goal of <  100 mg/dL)  . Sodium 05/04/2018 139  135 - 145 mEq/L Final  . Potassium 05/04/2018 4.6  3.5 - 5.1 mEq/L Final  . Chloride 05/04/2018 106  96 - 112 mEq/L Final  . CO2 05/04/2018 26  19 - 32 mEq/L Final  . Glucose, Bld 05/04/2018 115* 70 - 99 mg/dL Final  . BUN 05/04/2018 38* 6 - 23 mg/dL Final  . Creatinine, Ser 05/04/2018 1.66* 0.40 - 1.20 mg/dL Final  . Total Bilirubin 05/04/2018 0.4  0.2 - 1.2 mg/dL Final  . Alkaline Phosphatase 05/04/2018 41  39 - 117 U/L Final  . AST 05/04/2018 19  0 - 37 U/L Final  . ALT 05/04/2018 13  0 - 35 U/L Final  . Total Protein 05/04/2018 7.0  6.0 - 8.3 g/dL Final  . Albumin 05/04/2018 3.8  3.5 - 5.2 g/dL Final  . Calcium 05/04/2018 9.2  8.4 - 10.5 mg/dL Final  . GFR 05/04/2018 39.71* >60.00 mL/min Final  . Hgb A1c MFr Bld 05/04/2018 6.6* 4.6 - 6.5 % Final   Glycemic Control Guidelines for People with Diabetes:Non Diabetic:  <6%Goal of Therapy: <7%Additional Action Suggested:  >8%     Physical Examination:  BP (!) 168/72   Pulse 67   Ht 5\' 6"  (1.676 m)   Wt 193 lb (87.5 kg)  SpO2 97%   BMI 31.15 kg/m     ASSESSMENT:  Diabetes type 2, non-insulin-dependent See history of present illness for discussion of current diabetes management, blood sugar patterns and problems identified  Her A1c is much better at 6.6 compared to 7.2  Most of her improvement in blood sugar control is from her significantly improving her diet as discussed above and trying to be more active  HYPERTENSION, renal dysfunction and history of renovascular disease: Blood pressure is not controlled and she is not being followed by any physician for this problem regularly.  She does not wish to see her PCP at this time  Lipids: Well controlled  PLAN:   She will continue the same dose of Trulicity And encouraged her to check some readings after meals instead of just in the morning Encouraged her to continue improving her diet and regular exercise    Hypertension management:  See above She will be referred to nephrology for consultation again and follow-up  Influenza vaccine given   There are no Patient Instructions on file for this visit.    05/05/2018, 3:54 PM   Note: This office note was prepared with Dragon voice recognition system technology. Any transcriptional errors that result from this process are unintentional.

## 2018-06-03 ENCOUNTER — Other Ambulatory Visit: Payer: Self-pay | Admitting: Endocrinology

## 2018-06-29 ENCOUNTER — Other Ambulatory Visit: Payer: Self-pay | Admitting: Family Medicine

## 2018-06-29 DIAGNOSIS — E1169 Type 2 diabetes mellitus with other specified complication: Secondary | ICD-10-CM

## 2018-06-29 DIAGNOSIS — E785 Hyperlipidemia, unspecified: Principal | ICD-10-CM

## 2018-07-07 ENCOUNTER — Other Ambulatory Visit: Payer: Self-pay | Admitting: Family Medicine

## 2018-07-07 DIAGNOSIS — I10 Essential (primary) hypertension: Secondary | ICD-10-CM

## 2018-07-11 ENCOUNTER — Other Ambulatory Visit (HOSPITAL_COMMUNITY): Payer: Self-pay | Admitting: Nephrology

## 2018-07-11 DIAGNOSIS — N183 Chronic kidney disease, stage 3 unspecified: Secondary | ICD-10-CM

## 2018-07-13 ENCOUNTER — Ambulatory Visit (HOSPITAL_COMMUNITY)
Admission: RE | Admit: 2018-07-13 | Discharge: 2018-07-13 | Disposition: A | Discharge status: Home / Self Care | Payer: BC Managed Care – PPO | Source: Ambulatory Visit | Attending: Family | Admitting: Family

## 2018-07-13 DIAGNOSIS — N183 Chronic kidney disease, stage 3 unspecified: Secondary | ICD-10-CM

## 2018-07-25 LAB — BASIC METABOLIC PANEL
BUN: 41 — AB (ref 4–21)
CREATININE: 1.8 — AB (ref 0.5–1.1)
GLUCOSE: 125
Potassium: 4.5 (ref 3.4–5.3)
SODIUM: 138 (ref 137–147)

## 2018-07-25 LAB — HEPATIC FUNCTION PANEL: Alkaline Phosphatase: 3.4 — AB (ref 25–125)

## 2018-07-28 ENCOUNTER — Encounter: Payer: Self-pay | Admitting: Endocrinology

## 2018-07-28 NOTE — Progress Notes (Signed)
[facility]/thx dmf 

## 2018-07-29 ENCOUNTER — Other Ambulatory Visit: Payer: Self-pay

## 2018-07-29 DIAGNOSIS — E785 Hyperlipidemia, unspecified: Principal | ICD-10-CM

## 2018-07-29 DIAGNOSIS — E1169 Type 2 diabetes mellitus with other specified complication: Secondary | ICD-10-CM

## 2018-07-29 MED ORDER — ATORVASTATIN CALCIUM 40 MG PO TABS: ORAL_TABLET | ORAL | 0 refills | Status: DC

## 2018-08-28 ENCOUNTER — Other Ambulatory Visit: Payer: Self-pay | Admitting: Endocrinology

## 2018-08-28 ENCOUNTER — Other Ambulatory Visit: Payer: Self-pay | Admitting: Family Medicine

## 2018-08-28 DIAGNOSIS — E1169 Type 2 diabetes mellitus with other specified complication: Secondary | ICD-10-CM

## 2018-08-28 DIAGNOSIS — E785 Hyperlipidemia, unspecified: Principal | ICD-10-CM

## 2018-08-28 DIAGNOSIS — I10 Essential (primary) hypertension: Secondary | ICD-10-CM

## 2018-09-06 ENCOUNTER — Encounter: Payer: Self-pay | Admitting: Vascular Surgery

## 2018-09-06 ENCOUNTER — Encounter: Payer: Self-pay | Admitting: *Deleted

## 2018-09-06 ENCOUNTER — Telehealth: Payer: Self-pay | Admitting: *Deleted

## 2018-09-06 ENCOUNTER — Other Ambulatory Visit: Payer: Self-pay

## 2018-09-06 ENCOUNTER — Ambulatory Visit (INDEPENDENT_AMBULATORY_CARE_PROVIDER_SITE_OTHER): Payer: BC Managed Care – PPO | Admitting: Vascular Surgery

## 2018-09-06 ENCOUNTER — Other Ambulatory Visit: Payer: Self-pay | Admitting: *Deleted

## 2018-09-06 VITALS — BP 204/81 | HR 68 | Temp 97.7°F | Resp 16 | Ht 67.0 in | Wt 200.0 lb

## 2018-09-06 DIAGNOSIS — I701 Atherosclerosis of renal artery: Secondary | ICD-10-CM

## 2018-09-06 NOTE — Progress Notes (Signed)
Patient's renal angiogram is scheduled for Thursday September 08, 2018 with Dr. Carlis Abbott.

## 2018-09-06 NOTE — Progress Notes (Signed)
[facility] [place]  Patient name: Teresa Poole MRN: [id number] DOB: [birth year] Sex: female  REASON FOR CONSULT: Evaluation of left renal artery stenosis  HPI: Teresa Poole is a 68 y.o. 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  She reports long history of hypertension was told even as a child she did have higher blood pressure than typical.  She has had increasingly difficult time with control and is now on 5 different medications to attempt control of her hypertension.  She did undergo ultrasound of her renal arteries and this revealed significant stenosis in her left renal artery.  The date of this was 07/13/2018.  She did have an ultrasound in December 2016 also suggesting elevated velocities in her left renal artery and there is been progression since that time.  She has a prior history of coronary artery disease and also of mild renal insufficiency with a creatinine level of 1.8  Reports that she has just returned from [place] to care for her 64 year old father.  She reports a very unusual symptom complex of feeling as though she is burning with sulfuric acid over her entire body including her eyes.  She reports this typically happens after she has returned for approximately 1 week and then resolves.  Does not have any rash or ulceration associated with this.  Past Medical History:  Diagnosis Date  . Allergy   . Anemia   . CAD (coronary artery disease)    non-obstructive by LHC (09/25/2013): Proximal and mid LAD serial 20%, proximal circumflex 30%, mid AV groove circumflex 30%, mid RCA mild plaque.  . Cancer ([place])   . Carotid stenosis    a. Carotid US (09/26/2011): Bilateral 1-39% ICA.  Marland Kitchen Chronic combined systolic and diastolic CHF (congestive heart failure) ([place])   . CKD (chronic kidney disease)   . Diabetes mellitus without complication  ([place])   . GERD (gastroesophageal reflux disease)   . History of ovarian cancer   . Hyperlipidemia   . Hypertension   . NICM (nonischemic cardiomyopathy) ([place])    a. Echocardiogram (09/08/2013): Mild LVH, EF 30-35%, no WMA, mild LAE.  Marland Kitchen Obesity (BMI 30-39.9)   . Thyroid disease    Seen by specialist    Family History  Problem Relation Age of Onset  . Diabetes Father     SOCIAL HISTORY: Social History   Socioeconomic History  . Marital status: Single    Spouse name: Not on file  . Number of children: 0  . Years of education: Not on file  . Highest education level: Not on file  Occupational History    Employer: [employer]  Social Needs  . Financial resource strain: Not on file  . Food insecurity:    Worry: Not on file    Inability: Not on file  . Transportation needs:    Medical: Not on file    Non-medical: Not on file  Tobacco Use  . Smoking status: Never Smoker  . Smokeless tobacco: Never Used  Substance and Sexual Activity  . Alcohol use: No  . Drug use: No  . Sexual activity: Not on file  Lifestyle  . Physical activity:    Days per week: Not on file    Minutes per session: Not on file  . Stress: Not on file  Relationships  . Social connections:    Talks on phone: Not on file  Gets together: Not on file    Attends religious service: Not on file    Active member of club or organization: Not on file    Attends meetings of clubs or organizations: Not on file    Relationship status: Not on file  . Intimate partner violence:    Fear of current or ex partner: Not on file    Emotionally abused: Not on file    Physically abused: Not on file    Forced sexual activity: Not on file  Other Topics Concern  . Not on file  Social History Narrative   Works at [employer]   Patient lives at home alone.    Patient has no children.    Patient patient is right handed.    Patient is single.     No Known Allergies  Current Outpatient Medications  Medication Sig  Dispense Refill  . amLODipine (NORVASC) 10 MG tablet TAKE 1 TABLET(10 MG) BY MOUTH DAILY 30 tablet 0  . aspirin EC 81 MG EC tablet Take 1 tablet (81 mg total) by mouth daily. 90 tablet 0  . atorvastatin (LIPITOR) 40 MG tablet TAKE 1 TABLET BY MOUTH DAILY 30 tablet 0  . carvedilol (COREG) 12.5 MG tablet Take 1 tablet (12.5 mg total) 2 (two) times daily with a meal by mouth. 180 tablet 3  . glucose blood (ONE TOUCH ULTRA TEST) test strip Use to check blood sugar 3 times daily Dx code E11.65 100 each 3  . lisinopril (PRINIVIL,ZESTRIL) 40 MG tablet TAKE 1 TABLET(40 MG) BY MOUTH DAILY 90 tablet 0  . metFORMIN (GLUCOPHAGE-XR) 500 MG 24 hr tablet TAKE 1 TABLET BY MOUTH EVERY MORNING AND 2 TABLETS BY MOUTH EVERY DAY WITH SUPPER 270 tablet 3  . ONETOUCH VERIO test strip CHECK BLOOD SUGAR THREE TIMES DAILY 765 each 0  . TRULICITY 4.65 KP/5.4SF SOPN INJECT 1 PEN INTO THE ABDOMINAL SKIN AS DIRECTED EVERY WEEK 6 mL 0   No current facility-administered medications for this visit.     REVIEW OF SYSTEMS:  [X]  denotes positive finding, [ ]  denotes negative finding Cardiac  Comments:  Chest pain or chest pressure:    Shortness of breath upon exertion:    Short of breath when lying flat:    Irregular heart rhythm:        Vascular    Pain in calf, thigh, or hip brought on by ambulation:    Pain in feet at night that wakes you up from your sleep:  x  night cramps  Blood clot in your veins:    Leg swelling:  x       Pulmonary    Oxygen at home:    Productive cough:     Wheezing:         Neurologic    Sudden weakness in arms or legs:     Sudden numbness in arms or legs:     Sudden onset of difficulty speaking or slurred speech:    Temporary loss of vision in one eye:     Problems with dizziness:         Gastrointestinal    Blood in stool:     Vomited blood:         Genitourinary    Burning when urinating:     Blood in urine:        Psychiatric    Major depression:         Hematologic      Bleeding problems:  Problems with blood clotting too easily:        Skin    Rashes or ulcers:        Constitutional    Fever or chills:      PHYSICAL EXAM: Vitals:   09/06/18 0944 09/06/18 0952  BP: (!) 216/83 (!) 204/81  Pulse: 67 68  Resp: 16   Temp: 97.7 F (36.5 C)   TempSrc: Oral   SpO2: 100%   Weight: 200 lb (90.7 kg)   Height: 5\' 7"  (1.702 m)     GENERAL: The patient is a well-nourished female, in no acute distress. The vital signs are documented above. CARDIOVASCULAR: Carotid arteries are without bruits bilaterally.  2+ radial and 2+ dorsalis pedis pulses bilaterally.  I do not appreciate abdominal bruit. PULMONARY: There is good air exchange  ABDOMEN: Soft and non-tender  MUSCULOSKELETAL: There are no major deformities or cyanosis. NEUROLOGIC: No focal weakness or paresthesias are detected. SKIN: There are no ulcers or rashes noted. PSYCHIATRIC: The patient has a normal affect.  DATA:  Duplex revealed showing normal kidney size bilaterally.  No evidence of significant right renal artery stenosis.  Elevated velocities suggesting greater than 60% left renal artery stenosis  MEDICAL ISSUES: Had an extremely long discussion with the patient regarding her left renal artery stenosis.  I explained that this potentially could be a cause for her very difficult to control hypertension on 5 meds.  I have recommended arteriography for further evaluation and potential treatment.  I did explain the treatment option of angioplasty and stenting and that it is possible that this may not correct her hypertension but hopefully would give improvement.  She understands and wished to proceed at her earliest convenience as an outpatient at [facility]   Rosetta Posner, MD [facility] Tel [phone] Pager [phone]

## 2018-09-06 NOTE — Telephone Encounter (Signed)
Patient called to cancel her renal angiogram that we scheduled for Thursday 09-08-18. She stated " I am going to die that day, yall are going to kill me with this surgery". I tried to console her and explain the procedure to her but she started talking about acid coming out of her eyeballs, and that she had talked to Dr. Joelyn Oms about this before. She adamantly refuses to have this angiogram and I made Dr. Donnetta Hutching aware. She will call back to schedule this once she has " her financial affairs in order to pay the bill". I stressed to her that she could have this needed surgery and that [facility] / Dr. Donnetta Hutching would work with her to pay the bill. She said again that "she was going to die but she wanted to be able to pay her bills.". Unfortunately, I do not think this patient understood the ramifications of not having this procedure.

## 2018-09-07 ENCOUNTER — Other Ambulatory Visit (INDEPENDENT_AMBULATORY_CARE_PROVIDER_SITE_OTHER): Payer: BC Managed Care – PPO

## 2018-09-07 DIAGNOSIS — E118 Type 2 diabetes mellitus with unspecified complications: Secondary | ICD-10-CM

## 2018-09-07 LAB — COMPREHENSIVE METABOLIC PANEL
ALT: 12 U/L (ref 0–35)
AST: 16 U/L (ref 0–37)
Albumin: 3.7 g/dL (ref 3.5–5.2)
Alkaline Phosphatase: 42 U/L (ref 39–117)
BUN: 37 mg/dL — ABNORMAL HIGH (ref 6–23)
CHLORIDE: 106 meq/L (ref 96–112)
CO2: 28 mEq/L (ref 19–32)
Calcium: 9.3 mg/dL (ref 8.4–10.5)
Creatinine, Ser: 2.07 mg/dL — ABNORMAL HIGH (ref 0.40–1.20)
GFR: 30.75 mL/min — ABNORMAL LOW (ref >60.00)
Glucose, Bld: 105 mg/dL — ABNORMAL HIGH (ref 70–99)
POTASSIUM: 4.5 meq/L (ref 3.5–5.1)
Sodium: 140 mEq/L (ref 135–145)
Total Bilirubin: 0.3 mg/dL (ref 0.2–1.2)
Total Protein: 6.7 g/dL (ref 6.0–8.3)

## 2018-09-07 LAB — HEMOGLOBIN A1C: Hgb A1c MFr Bld: 6.6 % — ABNORMAL HIGH (ref 4.6–6.5)

## 2018-09-08 ENCOUNTER — Ambulatory Visit (HOSPITAL_COMMUNITY)
Admission: RE | Admit: 2018-09-08 | Payer: BC Managed Care – PPO | Source: Home / Self Care | Admitting: Vascular Surgery

## 2018-09-08 ENCOUNTER — Encounter (HOSPITAL_COMMUNITY): Admission: RE | Payer: Self-pay | Source: Home / Self Care

## 2018-09-08 SURGERY — RENAL ANGIOGRAPHY
Anesthesia: LOCAL

## 2018-09-12 ENCOUNTER — Ambulatory Visit: Payer: BC Managed Care – PPO | Admitting: Endocrinology

## 2018-09-16 ENCOUNTER — Ambulatory Visit: Payer: BC Managed Care – PPO | Admitting: Endocrinology

## 2018-09-16 ENCOUNTER — Encounter: Payer: Self-pay | Admitting: Endocrinology

## 2018-09-16 VITALS — BP 140/60 | HR 79 | Ht 67.0 in | Wt 197.8 lb

## 2018-09-16 DIAGNOSIS — E1169 Type 2 diabetes mellitus with other specified complication: Secondary | ICD-10-CM

## 2018-09-16 DIAGNOSIS — E669 Obesity, unspecified: Secondary | ICD-10-CM | POA: Diagnosis not present

## 2018-09-16 NOTE — Progress Notes (Signed)
Patient ID: Teresa Poole, female   DOB: 23-Feb-1951, 68 y.o.   MRN: [id number]           Reason for Appointment: Follow-up for Type 2 Diabetes  Referring physician: Fulp  History of Present Illness:          Diagnosis: Type 2 diabetes mellitus, date of diagnosis: 2000        Past history: At diagnosis she was having fatigue and she was initially treated with metformin Metformin was stopped about 1-1/2 years ago when she decided not to take any medications. She was in the hospital in 08/2013 and her blood sugar was about 460 with A1c of 15. She was started on Lantus and NovoLog and has been continued on this regimen since then. Has not been back on any oral hypoglycemic drugs in the past year  Recent history:   Non-insulin hypoglycemic drugs the patient is taking are: Metformin 0.5 g, Trulicity 1.96 mg weekly  On her initial consultation she was started on Trulicity to help with her postprandial readings and she was able to stop insulin with this  Her A1c is the same at 6.6   Current management, blood sugar patterns and problems identified:  She has checked her sugars only very infrequently  Although previously had been very well controlled in her diet she has been a little irregular with this with not consistently avoiding snacks and sweets  Has gained about 4 pounds since her last visit  Also not doing much exercise since she cannot get out much  Although she has been previously on 2 tablets of metformin daily she has cut this down to 1 tablet only on her own for no particular reason  She is quite regular with taking her Trulicity      Side effects from medications have been: GI distress with over 1000 mg metformin, Trulicity 1.5 mg causes decreased appetite Compliance with the medical regimen: Fair  Glucose monitoring: Using One Touch Verio monitor  Recent blood sugars ranging from 76-131 with most readings midday and only 4 readings   Self-care:      Meals:  2-3  meals per day. Breakfast is meat, egg, potatoes and sometimes oatmeal/fruit.           Exercise: walking at work mostly        [place] visit, most recent: 3/16.               Weight history: Previous range 205-245  Wt Readings from Last 3 Encounters:  09/16/18 197 lb 12.8 oz (89.7 kg)  09/06/18 200 lb (90.7 kg)  05/05/18 193 lb (87.5 kg)    Glycemic control:    Lab Results  Component Value Date   HGBA1C 6.6 (H) 09/07/2018   HGBA1C 6.6 (H) 05/04/2018   HGBA1C 7.2 (H) 01/21/2018   Lab Results  Component Value Date   MICROALBUR 89.3 (H) 01/21/2018   LDLCALC 72 05/04/2018   CREATININE 2.07 (H) 09/07/2018     No visits with results within 1 Week(s) from this visit.  Latest known visit with results is:  Lab on 09/07/2018  Component Date Value Ref Range Status  . Sodium 09/07/2018 140  135 - 145 mEq/L Final  . Potassium 09/07/2018 4.5  3.5 - 5.1 mEq/L Final  . Chloride 09/07/2018 106  96 - 112 mEq/L Final  . CO2 09/07/2018 28  19 - 32 mEq/L Final  . Glucose, Bld 09/07/2018 105* 70 - 99 mg/dL Final  . BUN 09/07/2018 37*  6 - 23 mg/dL Final  . Creatinine, Ser 09/07/2018 2.07* 0.40 - 1.20 mg/dL Final  . Total Bilirubin 09/07/2018 0.3  0.2 - 1.2 mg/dL Final  . Alkaline Phosphatase 09/07/2018 42  39 - 117 U/L Final  . AST 09/07/2018 16  0 - 37 U/L Final  . ALT 09/07/2018 12  0 - 35 U/L Final  . Total Protein 09/07/2018 6.7  6.0 - 8.3 g/dL Final  . Albumin 09/07/2018 3.7  3.5 - 5.2 g/dL Final  . Calcium 09/07/2018 9.3  8.4 - 10.5 mg/dL Final  . GFR 09/07/2018 30.75* >60.00 mL/min Final  . Hgb A1c MFr Bld 09/07/2018 6.6* 4.6 - 6.5 % Final   Glycemic Control Guidelines for People with Diabetes:Non Diabetic:  <6%Goal of Therapy: <7%Additional Action Suggested:  >8%      Allergies as of 09/16/2018   No Known Allergies     Medication List       Accurate as of September 16, 2018 11:59 PM. Always use your most recent med list.        amLODipine 10 MG tablet Commonly known  as:  NORVASC TAKE 1 TABLET(10 MG) BY MOUTH DAILY   aspirin 81 MG EC tablet Take 1 tablet (81 mg total) by mouth daily.   atorvastatin 40 MG tablet Commonly known as:  LIPITOR TAKE 1 TABLET BY MOUTH DAILY   carvedilol 12.5 MG tablet Commonly known as:  COREG Take 1 tablet (12.5 mg total) 2 (two) times daily with a meal by mouth.   glucose blood test strip Commonly known as:  ONE TOUCH ULTRA TEST Use to check blood sugar 3 times daily Dx code E11.65   ONETOUCH VERIO test strip Generic drug:  glucose blood CHECK BLOOD SUGAR THREE TIMES DAILY   lisinopril 40 MG tablet Commonly known as:  PRINIVIL,ZESTRIL TAKE 1 TABLET(40 MG) BY MOUTH DAILY   metFORMIN 500 MG 24 hr tablet Commonly known as:  GLUCOPHAGE-XR TAKE 1 TABLET BY MOUTH EVERY MORNING AND 2 TABLETS BY MOUTH EVERY DAY WITH SUPPER   TRULICITY 8.88 BV/6.9IH Sopn Generic drug:  Dulaglutide INJECT 1 PEN INTO THE ABDOMINAL SKIN AS DIRECTED EVERY WEEK       Allergies: No Known Allergies  Past Medical History:  Diagnosis Date  . Allergy   . Anemia   . CAD (coronary artery disease)    non-obstructive by LHC (09/25/2013): Proximal and mid LAD serial 20%, proximal circumflex 30%, mid AV groove circumflex 30%, mid RCA mild plaque.  . Cancer ([place])   . Carotid stenosis    a. Carotid US (09/26/2011): Bilateral 1-39% ICA.  Marland Kitchen Chronic combined systolic and diastolic CHF (congestive heart failure) ([place])   . CKD (chronic kidney disease)   . Diabetes mellitus without complication ([place])   . GERD (gastroesophageal reflux disease)   . History of ovarian cancer   . Hyperlipidemia   . Hypertension   . NICM (nonischemic cardiomyopathy) ([place])    a. Echocardiogram (09/08/2013): Mild LVH, EF 30-35%, no WMA, mild LAE.  Marland Kitchen Obesity (BMI 30-39.9)   . Thyroid disease    Seen by specialist    Past Surgical History:  Procedure Laterality Date  . LEFT HEART CATHETERIZATION WITH CORONARY ANGIOGRAM N/A 09/25/2013   Procedure: LEFT HEART  CATHETERIZATION WITH CORONARY ANGIOGRAM;  Surgeon: Burnell Blanks, MD;  [facility];  Service: Cardiovascular;  Laterality: N/A;    Family History  Problem Relation Age of Onset  . Diabetes Father     Social History:  reports that she has never smoked. She has never used smokeless tobacco. She reports that she does not drink alcohol or use drugs.    Review of Systems    Thyroid: She has a 2.8 cm mixed echogenic predominantly solid nodule on the right side and a biopsy was benign   Thyroid levels have been normal  Lab Results  Component Value Date   TSH 1.61 05/04/2018   TSH 1.66 12/21/2016   TSH 1.07 12/20/2014          Lipids:  has been treated with Simvastatin 40 mg by PCP with good control       Lab Results  Component Value Date   CHOL 141 05/04/2018   HDL 54.20 05/04/2018   LDLCALC 72 05/04/2018   TRIG 75.0 05/04/2018   CHOLHDL 3 05/04/2018                   The blood pressure has been treated with carvedilol, amlodipine 10 mg and lisinopril 40 mg. Recently seen by nephrologist Has been recommended possible stenting of the left renal artery but she is not wanting to do this because of cost   Renal artery ultrasound done in 2016 showed the following: Findings worrisome for a potential hemodynamically significant stenosis involving the origin and mid aspects of the left renal artery. Further evaluation with CTA could be performed as clinically indicated.  BP Readings from Last 3 Encounters:  09/16/18 140/60  09/06/18 (!) 204/81  05/05/18 (!) 168/72   RENAL dysfunction getting progressively worse: She is due to follow-up with her nephrologist  Lab Results  Component Value Date   CREATININE 2.07 (H) 09/07/2018   CREATININE 1.8 (A) 07/25/2018   CREATININE 1.66 (H) 05/04/2018    Previous vitamin D level has been low, currently on vitamin D3, 2000 UNITS  Lab Results  Component Value Date   VD25OH 34.89 01/21/2018   VD25OH 21.69 (L)  12/21/2016      LABS:  No visits with results within 1 Week(s) from this visit.  Latest known visit with results is:  Lab on 09/07/2018  Component Date Value Ref Range Status  . Sodium 09/07/2018 140  135 - 145 mEq/L Final  . Potassium 09/07/2018 4.5  3.5 - 5.1 mEq/L Final  . Chloride 09/07/2018 106  96 - 112 mEq/L Final  . CO2 09/07/2018 28  19 - 32 mEq/L Final  . Glucose, Bld 09/07/2018 105* 70 - 99 mg/dL Final  . BUN 09/07/2018 37* 6 - 23 mg/dL Final  . Creatinine, Ser 09/07/2018 2.07* 0.40 - 1.20 mg/dL Final  . Total Bilirubin 09/07/2018 0.3  0.2 - 1.2 mg/dL Final  . Alkaline Phosphatase 09/07/2018 42  39 - 117 U/L Final  . AST 09/07/2018 16  0 - 37 U/L Final  . ALT 09/07/2018 12  0 - 35 U/L Final  . Total Protein 09/07/2018 6.7  6.0 - 8.3 g/dL Final  . Albumin 09/07/2018 3.7  3.5 - 5.2 g/dL Final  . Calcium 09/07/2018 9.3  8.4 - 10.5 mg/dL Final  . GFR 09/07/2018 30.75* >60.00 mL/min Final  . Hgb A1c MFr Bld 09/07/2018 6.6* 4.6 - 6.5 % Final   Glycemic Control Guidelines for People with Diabetes:Non Diabetic:  <6%Goal of Therapy: <7%Additional Action Suggested:  >8%     Physical Examination:  BP 140/60 (BP Location: Left Arm, Patient Position: Sitting, Cuff Size: Normal)   Pulse 79   Ht 5\' 7"  (1.702 m)   Wt 197 lb 12.8 oz (  89.7 kg)   SpO2 97%   BMI 30.98 kg/m   Diabetic Foot Exam - Simple   Simple Foot Form Diabetic Foot exam was performed with the following findings:  Yes 09/16/2018  4:14 PM  Visual Inspection No deformities, no ulcerations, no other skin breakdown bilaterally:  Yes Sensation Testing Intact to touch and monofilament testing bilaterally:  Yes Pulse Check Posterior Tibialis and Dorsalis pulse intact bilaterally:  Yes Comments Rt. Ankle edema       ASSESSMENT:  Diabetes type 2, non-insulin-dependent See history of present illness for discussion of current diabetes management, blood sugar patterns and problems identified  Her A1c is still  fairly good at 6.6 again  Currently on lower doses of metformin and Trulicity  Not clear if her A1c is lower because of renal dysfunction being worse She takes only rarely at home but her blood sugars are looking fairly good and also fairly good at 105 in the lab She can do better with exercise and weight loss   Foot exam shows no neuropathy  HYPERTENSION, renal dysfunction and history of renovascular disease: Blood pressure is better today  Lipids: Well controlled  PLAN:   She will continue 6.19 mg Trulicity Because of her worsening renal functions she can stop her metformin, she is currently only taking 500 mg She will need to try walking dose more often Discussed need for weight loss and cutting back on portions and snacks again  Hypertension and CKD management: Done by nephrologist, is due for follow-up     Patient Instructions  Check blood sugars on waking up 2 days a week  Also check blood sugars about 2 hours after meals and do this after different meals by rotation  Recommended blood sugar levels on waking up are 90-130 and about 2 hours after meal is 130-160  Please bring your blood sugar monitor to each visit, thank you       09/17/2018, 5:55 PM   Note: This office note was prepared with Dragon voice recognition system technology. Any transcriptional errors that result from this process are unintentional.

## 2018-09-16 NOTE — Patient Instructions (Signed)
Check blood sugars on waking up 2 days a week  Also check blood sugars about 2 hours after meals and do this after different meals by rotation  Recommended blood sugar levels on waking up are 90-130 and about 2 hours after meal is 130-160  Please bring your blood sugar monitor to each visit, thank you   

## 2018-10-04 ENCOUNTER — Emergency Department (HOSPITAL_COMMUNITY): Payer: BC Managed Care – PPO

## 2018-10-04 ENCOUNTER — Inpatient Hospital Stay (HOSPITAL_COMMUNITY)
Admission: EM | Admit: 2018-10-04 | Discharge: 2018-10-07 | DRG: 253 | Disposition: A | Discharge status: Home / Self Care | Payer: BC Managed Care – PPO | Attending: Internal Medicine | Admitting: Internal Medicine

## 2018-10-04 ENCOUNTER — Encounter (HOSPITAL_COMMUNITY): Payer: Self-pay

## 2018-10-04 ENCOUNTER — Other Ambulatory Visit: Payer: Self-pay

## 2018-10-04 DIAGNOSIS — E1165 Type 2 diabetes mellitus with hyperglycemia: Secondary | ICD-10-CM | POA: Diagnosis present

## 2018-10-04 DIAGNOSIS — I160 Hypertensive urgency: Principal | ICD-10-CM | POA: Diagnosis present

## 2018-10-04 DIAGNOSIS — E1122 Type 2 diabetes mellitus with diabetic chronic kidney disease: Secondary | ICD-10-CM | POA: Diagnosis present

## 2018-10-04 DIAGNOSIS — Z7982 Long term (current) use of aspirin: Secondary | ICD-10-CM

## 2018-10-04 DIAGNOSIS — I428 Other cardiomyopathies: Secondary | ICD-10-CM | POA: Diagnosis present

## 2018-10-04 DIAGNOSIS — Z8543 Personal history of malignant neoplasm of ovary: Secondary | ICD-10-CM

## 2018-10-04 DIAGNOSIS — N183 Chronic kidney disease, stage 3 unspecified: Secondary | ICD-10-CM | POA: Diagnosis present

## 2018-10-04 DIAGNOSIS — Z6830 Body mass index (BMI) 30.0-30.9, adult: Secondary | ICD-10-CM

## 2018-10-04 DIAGNOSIS — Z7984 Long term (current) use of oral hypoglycemic drugs: Secondary | ICD-10-CM

## 2018-10-04 DIAGNOSIS — I130 Hypertensive heart and chronic kidney disease with heart failure and stage 1 through stage 4 chronic kidney disease, or unspecified chronic kidney disease: Secondary | ICD-10-CM | POA: Diagnosis present

## 2018-10-04 DIAGNOSIS — E785 Hyperlipidemia, unspecified: Secondary | ICD-10-CM | POA: Diagnosis present

## 2018-10-04 DIAGNOSIS — K219 Gastro-esophageal reflux disease without esophagitis: Secondary | ICD-10-CM | POA: Diagnosis present

## 2018-10-04 DIAGNOSIS — E669 Obesity, unspecified: Secondary | ICD-10-CM | POA: Diagnosis present

## 2018-10-04 DIAGNOSIS — I5042 Chronic combined systolic (congestive) and diastolic (congestive) heart failure: Secondary | ICD-10-CM | POA: Diagnosis present

## 2018-10-04 DIAGNOSIS — N184 Chronic kidney disease, stage 4 (severe): Secondary | ICD-10-CM | POA: Diagnosis present

## 2018-10-04 DIAGNOSIS — Z8249 Family history of ischemic heart disease and other diseases of the circulatory system: Secondary | ICD-10-CM

## 2018-10-04 DIAGNOSIS — I701 Atherosclerosis of renal artery: Secondary | ICD-10-CM | POA: Diagnosis present

## 2018-10-04 DIAGNOSIS — IMO0002 Reserved for concepts with insufficient information to code with codable children: Secondary | ICD-10-CM | POA: Diagnosis present

## 2018-10-04 DIAGNOSIS — Z833 Family history of diabetes mellitus: Secondary | ICD-10-CM

## 2018-10-04 DIAGNOSIS — Z79899 Other long term (current) drug therapy: Secondary | ICD-10-CM

## 2018-10-04 DIAGNOSIS — I5041 Acute combined systolic (congestive) and diastolic (congestive) heart failure: Secondary | ICD-10-CM | POA: Diagnosis present

## 2018-10-04 DIAGNOSIS — I5021 Acute systolic (congestive) heart failure: Secondary | ICD-10-CM | POA: Diagnosis present

## 2018-10-04 DIAGNOSIS — I2510 Atherosclerotic heart disease of native coronary artery without angina pectoris: Secondary | ICD-10-CM | POA: Diagnosis present

## 2018-10-04 DIAGNOSIS — D631 Anemia in chronic kidney disease: Secondary | ICD-10-CM | POA: Diagnosis present

## 2018-10-04 LAB — URINALYSIS, ROUTINE W REFLEX MICROSCOPIC
Bilirubin Urine: NEGATIVE
Glucose, UA: NEGATIVE mg/dL
Hgb urine dipstick: NEGATIVE
Ketones, ur: NEGATIVE mg/dL
Nitrite: NEGATIVE
Protein, ur: 100 mg/dL — AB
Specific Gravity, Urine: 1.012 (ref 1.005–1.030)
pH: 5 (ref 5.0–8.0)

## 2018-10-04 LAB — BASIC METABOLIC PANEL
Anion gap: 12 (ref 5–15)
BUN: 39 mg/dL — AB (ref 8–23)
CO2: 23 mmol/L (ref 22–32)
CREATININE: 2.13 mg/dL — AB (ref 0.44–1.00)
Calcium: 9.5 mg/dL (ref 8.9–10.3)
Chloride: 107 mmol/L (ref 98–111)
GFR calc Af Amer: 27 mL/min — ABNORMAL LOW (ref >60)
GFR calc non Af Amer: 24 mL/min — ABNORMAL LOW (ref >60)
GLUCOSE: 102 mg/dL — AB (ref 70–99)
Potassium: 4.6 mmol/L (ref 3.5–5.1)
Sodium: 142 mmol/L (ref 135–145)

## 2018-10-04 LAB — I-STAT TROPONIN, ED: Troponin i, poc: 0.01 ng/mL (ref 0.00–0.08)

## 2018-10-04 LAB — CBC
HCT: 32.7 % — ABNORMAL LOW (ref 36.0–46.0)
Hemoglobin: 10.5 g/dL — ABNORMAL LOW (ref 12.0–15.0)
MCH: 29.2 pg (ref 26.0–34.0)
MCHC: 32.1 g/dL (ref 30.0–36.0)
MCV: 91.1 fL (ref 80.0–100.0)
Platelets: 258 10*3/uL (ref 150–400)
RBC: 3.59 MIL/uL — ABNORMAL LOW (ref 3.87–5.11)
RDW: 13.2 % (ref 11.5–15.5)
WBC: 6.5 10*3/uL (ref 4.0–10.5)
nRBC: 0 % (ref 0.0–0.2)

## 2018-10-04 LAB — GLUCOSE, CAPILLARY: Glucose-Capillary: 108 mg/dL — ABNORMAL HIGH (ref 70–99)

## 2018-10-04 MED ORDER — VITAMIN D 25 MCG (1000 UNIT) PO TABS
2000.0000 [IU] | ORAL_TABLET | Freq: Every day | ORAL | Status: DC
  Administered 2018-10-05 – 2018-10-07 (×2): 2000 [IU] via ORAL
  Filled 2018-10-04 (×2): qty 2

## 2018-10-04 MED ORDER — ACETAMINOPHEN 325 MG PO TABS: 650.0000 mg | ORAL_TABLET | Freq: Four times a day (QID) | ORAL | Status: DC | PRN

## 2018-10-04 MED ORDER — ONDANSETRON HCL 4 MG/2ML IJ SOLN: 4.0000 mg | Freq: Four times a day (QID) | INTRAMUSCULAR | Status: DC | PRN

## 2018-10-04 MED ORDER — SODIUM CHLORIDE 0.9 % IV SOLN
INTRAVENOUS | Status: AC
  Administered 2018-10-04: 20:00:00 via INTRAVENOUS

## 2018-10-04 MED ORDER — LABETALOL HCL 5 MG/ML IV SOLN
5.0000 mg | INTRAVENOUS | Status: DC | PRN
  Administered 2018-10-04: 5 mg via INTRAVENOUS
  Filled 2018-10-04: qty 4

## 2018-10-04 MED ORDER — ENOXAPARIN SODIUM 40 MG/0.4ML ~~LOC~~ SOLN
40.0000 mg | SUBCUTANEOUS | Status: DC
  Administered 2018-10-04 – 2018-10-06 (×3): 40 mg via SUBCUTANEOUS
  Filled 2018-10-04 (×3): qty 0.4

## 2018-10-04 MED ORDER — HYDRALAZINE HCL 20 MG/ML IJ SOLN
10.0000 mg | Freq: Once | INTRAMUSCULAR | Status: AC
  Administered 2018-10-04: 10 mg via INTRAVENOUS
  Filled 2018-10-04: qty 1

## 2018-10-04 MED ORDER — INSULIN ASPART 100 UNIT/ML ~~LOC~~ SOLN: 0.0000 [IU] | Freq: Three times a day (TID) | SUBCUTANEOUS | Status: DC

## 2018-10-04 MED ORDER — ONDANSETRON HCL 4 MG PO TABS: 4.0000 mg | ORAL_TABLET | Freq: Four times a day (QID) | ORAL | Status: DC | PRN

## 2018-10-04 MED ORDER — AMLODIPINE BESYLATE 10 MG PO TABS
10.0000 mg | ORAL_TABLET | Freq: Every day | ORAL | Status: DC
  Administered 2018-10-05 – 2018-10-07 (×2): 10 mg via ORAL
  Filled 2018-10-04: qty 1
  Filled 2018-10-04: qty 2

## 2018-10-04 MED ORDER — CARVEDILOL 25 MG PO TABS
25.0000 mg | ORAL_TABLET | Freq: Two times a day (BID) | ORAL | Status: DC
  Administered 2018-10-05 – 2018-10-07 (×5): 25 mg via ORAL
  Filled 2018-10-04 (×5): qty 1

## 2018-10-04 MED ORDER — DIPHENHYDRAMINE HCL 25 MG PO CAPS: 25.0000 mg | ORAL_CAPSULE | Freq: Once | ORAL | Status: DC

## 2018-10-04 MED ORDER — TRAMADOL HCL 50 MG PO TABS: 50.0000 mg | ORAL_TABLET | Freq: Four times a day (QID) | ORAL | Status: DC | PRN

## 2018-10-04 MED ORDER — ATORVASTATIN CALCIUM 40 MG PO TABS
40.0000 mg | ORAL_TABLET | Freq: Every evening | ORAL | Status: DC
  Administered 2018-10-04 – 2018-10-07 (×4): 40 mg via ORAL
  Filled 2018-10-04 (×4): qty 1

## 2018-10-04 MED ORDER — SODIUM CHLORIDE 0.9% FLUSH
3.0000 mL | Freq: Two times a day (BID) | INTRAVENOUS | Status: DC
  Administered 2018-10-05 – 2018-10-07 (×3): 3 mL via INTRAVENOUS

## 2018-10-04 MED ORDER — TRAZODONE HCL 50 MG PO TABS: 25.0000 mg | ORAL_TABLET | Freq: Every evening | ORAL | Status: DC | PRN

## 2018-10-04 MED ORDER — ACETAMINOPHEN 650 MG RE SUPP: 650.0000 mg | Freq: Four times a day (QID) | RECTAL | Status: DC | PRN

## 2018-10-04 MED ORDER — INDAPAMIDE 2.5 MG PO TABS
2.5000 mg | ORAL_TABLET | Freq: Every day | ORAL | Status: DC
  Administered 2018-10-05 – 2018-10-07 (×2): 2.5 mg via ORAL
  Filled 2018-10-04 (×3): qty 1

## 2018-10-04 MED ORDER — LISINOPRIL 40 MG PO TABS
40.0000 mg | ORAL_TABLET | Freq: Every day | ORAL | Status: DC
  Administered 2018-10-05 – 2018-10-07 (×2): 40 mg via ORAL
  Filled 2018-10-04: qty 1
  Filled 2018-10-04: qty 2

## 2018-10-04 NOTE — H&P (Signed)
History and Physical    Teresa Poole YKD:[id number] DOB: 1950-09-07 DOA: 10/04/2018  PCP: Orpah Melter, MD  Patient coming from: Dentists office  I have personally briefly reviewed patient's old medical records in [place]  Chief Complaint: Elevated blood pressure  HPI: Teresa Poole is a 68 y.o. female with medical history significant of chronic kidney disease stage II, diabetes mellitus without complication, hyperlipidemia, hypertension, nonischemic cardiomyopathy with an EF of 30 to 35%, obesity, and coronary artery disease which was nonobstructive who has had a lifetime of difficulty managing her blood pressure.  She went to the dentist today due to some left-sided dental jaw pain when blood pressure was checked her systolic was greater than 200.  Dentist recommended she go to the emergency department.  Patient is on 5 different antihypertensive meds some of which have been added recently.  She is followed by nephrology.  She reports that she currently is experiencing some global head pressure and some sharp and shooting pain in her left upper teeth.  She denies any chest pain or shortness of breath.  She has no confusion or vision changes.  She has no hematuria.  She has home most daily intermittent blurred vision.  She has had for the past couple weeks been experiencing episodes of headaches.  She had a consultation with her vascular surgeon Dr. early who stated she would ultimately receive elective angioplasty for stenting of the left renal artery which has some stenosis.  Insurance and deductible issues are preventing planning surgery at this point.  ED Course: Also the patient has recently been started on hydralazine and notices some itching when she takes it.  Here in the emergency department she got a dose of IV hydralazine and developed more severe symptoms of itching and discomfort.    Review of Systems: As per HPI otherwise all other systems reviewed and   negative.   Past Medical History:  Diagnosis Date  . Allergy   . Anemia   . CAD (coronary artery disease)    non-obstructive by LHC (09/25/2013): Proximal and mid LAD serial 20%, proximal circumflex 30%, mid AV groove circumflex 30%, mid RCA mild plaque.  . Cancer ([place])   . Carotid stenosis    a. Carotid US (09/26/2011): Bilateral 1-39% ICA.  Marland Kitchen Chronic combined systolic and diastolic CHF (congestive heart failure) ([place])   . CKD (chronic kidney disease)   . Diabetes mellitus without complication ([place])   . GERD (gastroesophageal reflux disease)   . History of ovarian cancer   . Hyperlipidemia   . Hypertension   . NICM (nonischemic cardiomyopathy) ([place])    a. Echocardiogram (09/08/2013): Mild LVH, EF 30-35%, no WMA, mild LAE.  Marland Kitchen Obesity (BMI 30-39.9)   . Thyroid disease    Seen by specialist    Past Surgical History:  Procedure Laterality Date  . LEFT HEART CATHETERIZATION WITH CORONARY ANGIOGRAM N/A 09/25/2013   Procedure: LEFT HEART CATHETERIZATION WITH CORONARY ANGIOGRAM;  Surgeon: Burnell Blanks, MD;  Location: [facility] CATH LAB;  Service: Cardiovascular;  Laterality: N/A;    Social History   Social History Narrative   Works at [employer]   Patient lives at home alone.    Patient has no children.    Patient patient is right handed.    Patient is single.      reports that she has never smoked. She has never used smokeless tobacco. She reports that she does not drink alcohol or use drugs.  Allergies  Allergen Reactions  .  Hydralazine Hcl Itching and Other (See Comments)    Burning sensation    Family History  Problem Relation Age of Onset  . Diabetes Father   . Hypertension Father   . Hypertension Maternal Grandfather      Prior to Admission medications   Medication Sig Start Date End Date Taking? Authorizing Provider  amLODipine (NORVASC) 10 MG tablet TAKE 1 TABLET(10 MG) BY MOUTH DAILY 08/29/18   Copland, Gay Filler, MD  aspirin EC 81 MG EC tablet Take 1 tablet (81 mg  total) by mouth daily. 09/10/13   Janece Canterbury, MD  atorvastatin (LIPITOR) 40 MG tablet TAKE 1 TABLET BY MOUTH DAILY 08/29/18   Copland, Gay Filler, MD  carvedilol (COREG) 12.5 MG tablet Take 1 tablet (12.5 mg total) 2 (two) times daily with a meal by mouth. 07/01/17   Copland, Gay Filler, MD  glucose blood (ONE TOUCH ULTRA TEST) test strip Use to check blood sugar 3 times daily Dx code E11.65 11/20/14   Elayne Snare, MD  lisinopril (PRINIVIL,ZESTRIL) 40 MG tablet TAKE 1 TABLET(40 MG) BY MOUTH DAILY 07/07/18   Copland, Gay Filler, MD  metFORMIN (GLUCOPHAGE-XR) 500 MG 24 hr tablet TAKE 1 TABLET BY MOUTH EVERY MORNING AND 2 TABLETS BY MOUTH EVERY DAY WITH SUPPER Patient taking differently: 500 mg. TAKE 1 TABLET BY MOUTH ONCE DAILY. 07/01/17   Copland, Gay Filler, MD  ONETOUCH VERIO test strip CHECK BLOOD SUGAR THREE TIMES DAILY 03/30/18   Elayne Snare, MD  TRULICITY 4.40 NU/2.7OZ SOPN INJECT 1 PEN INTO THE ABDOMINAL SKIN AS DIRECTED EVERY WEEK 08/29/18   Elayne Snare, MD    Physical Exam:  Constitutional: NAD, calm, comfortable Vitals:   10/04/18 1630 10/04/18 1645 10/04/18 1701 10/04/18 1704  BP: (!) 191/61 (!) 161/71 (!) 180/61   Pulse: 74 75  81  Resp: 12 17  (!) 24  Temp:      SpO2: 100% 100%  100%   Eyes: PERRL, lids and conjunctivae normal ENMT: Mucous membranes are moist. Posterior pharynx clear of any exudate or lesions.Normal dentition.  Neck: normal, supple, no masses, no thyromegaly Respiratory: clear to auscultation bilaterally, no wheezing, no crackles. Normal respiratory effort. No accessory muscle use.  Cardiovascular: Regular rate and rhythm, no murmurs / rubs / gallops. No extremity edema. 2+ pedal pulses. No carotid bruits.  Abdomen: no tenderness, no masses palpated. No hepatosplenomegaly. Bowel sounds positive.  Musculoskeletal: no clubbing / cyanosis. No joint deformity upper and lower extremities. Good ROM, no contractures. Normal muscle tone.  Skin: no rashes, lesions, ulcers. No  induration Neurologic: CN 2-12 grossly intact. Sensation intact, DTR normal. Strength 5/5 in all 4.  Psychiatric: Normal judgment and insight. Alert and oriented x 3. Normal mood.    Labs on Admission: I have personally reviewed following labs and imaging studies  CBC: Recent Labs  Lab 10/04/18 1352  WBC 6.5  HGB 10.5*  HCT 32.7*  MCV 91.1  PLT 366   Basic Metabolic Panel: Recent Labs  Lab 10/04/18 1352  NA 142  K 4.6  CL 107  CO2 23  GLUCOSE 102*  BUN 39*  CREATININE 2.13*  CALCIUM 9.5   Urine analysis:    Component Value Date/Time   COLORURINE YELLOW 09/22/2013 1451   APPEARANCEUR CLOUDY (A) 09/22/2013 1451   LABSPEC 1.018 09/22/2013 1451   PHURINE 5.5 09/22/2013 1451   GLUCOSEU NEGATIVE 09/22/2013 1451   HGBUR NEGATIVE 09/22/2013 1451   BILIRUBINUR NEGATIVE 09/22/2013 1451   KETONESUR 15 (A) 09/22/2013 1451  PROTEINUR >300 (A) 09/22/2013 1451   UROBILINOGEN 0.2 09/22/2013 1451   NITRITE NEGATIVE 09/22/2013 1451   LEUKOCYTESUR SMALL (A) 09/22/2013 1451    Radiological Exams on Admission: Ct Head Wo Contrast  Result Date: 10/04/2018 CLINICAL DATA:  Hypertension, worst headache of life EXAM: CT HEAD WITHOUT CONTRAST TECHNIQUE: Contiguous axial images were obtained from the base of the skull through the vertex without intravenous contrast. COMPARISON:  None. FINDINGS: Brain: No evidence of acute infarction, hemorrhage, hydrocephalus, extra-axial collection or mass lesion/mass effect. Vascular: No hyperdense vessel or unexpected calcification. Skull: Normal. Negative for fracture or focal lesion. Sinuses/Orbits: No acute finding. Other: None. IMPRESSION: No acute intracranial pathology. No non-contrast CT findings to explain headache. Electronically Signed   By: Eddie Candle M.D.   On: 10/04/2018 17:03    EKG: Independently reviewed. SR no change when compared to 09/28/2013  Assessment/Plan Principal Problem:   Hypertensive urgency Active Problems:   Diabetes  mellitus type II, uncontrolled ([place])   Acute combined systolic and diastolic congestive heart failure (HCC)   CKD (chronic kidney disease), stage III ([place])    1.  Hypertensive urgency: Her heart rate seems to be running about the 70s.  I am going to increase her carvedilol to 25 mg p.o. twice daily.  Also give her labetalol 5 mg IV as needed systolic blood pressure greater than 258 or diastolic blood pressure greater than 90.  Restart home medicines but will hold hydralazine due to allergic reaction.  2.  Diabetes mellitus type 2 uncontrolled placed on sliding scale insulin coverage and monitor closely continue home insulin but hold metformin.  3.  Acute combined systolic and diastolic congestive heart failure: Currently stable and well compensated we will monitor.  4.  Chronic kidney disease stage III: Avoid nephrotoxic agents.  DVT prophylaxis: Subcu Lovenox Code Status: Full code Family Communication: Spoke with patient and her sister who present at the time of admission.  Patient retains capacity. Disposition Plan: Likely home in 24 to 48 hours Consults called: Nephrology patient is followed by Dr. Joelyn Oms Admission status: Observation   Lady Deutscher MD [facility] Pager [phone]  How to contact the [facility] Attending or Consulting provider [place] or covering provider during after hours [place], for this patient?  1. Check the care team in [facility] and look for a) attending/consulting [facility] provider listed and b) the [facility] team listed 2. Log into [email or web address] and use [place]'s universal password to access. If you do not have the password, please contact the hospital operator. 3. Locate the [facility] provider you are looking for under [facility] and page to a number that you can be directly reached. 4. If you still have difficulty reaching the provider, please page the [facility] (Director on Call) for the Hospitalists listed on amion for assistance.  If 7PM-7AM, please  contact night-coverage [email or web address] Password [facility]  10/04/2018, 6:17 PM

## 2018-10-04 NOTE — ED Provider Notes (Signed)
[facility] EMERGENCY DEPARTMENT Provider Note   CSN: [id number] Arrival date & time: 10/04/18  1139     History   Chief Complaint Chief Complaint  Patient presents with  . Hypertension    HPI Teresa Poole is a 68 y.o. female.  HPI   Patient is a 68 year old female with a history of nonischemic cardiomyopathy, EF 30 to 35%, hypertension, renal artery stenosis, CKD presenting for elevated blood pressure.  Patient reports that she presented to her dentist today for some left-sided dental pain when they noted that her blood pressure was in the systolic greater than 128N.  They stated that she should go to the emergency department.  Patient reports that she is on 5 different antihypertensives some of which have been added recently.  She is followed by nephrology.  She reports that she currently is experiencing some global "head pressure" and some sharp and shooting pain in her left upper teeth.  She denies any chest pain or shortness of breath.  She denies any confusion or new vision changes.  She reports that she has almost daily intermittent blurred vision.  Patient reports that she had a consultation with her vascular surgeon, Dr. Donnetta Hutching, who stated that she would ultimately receive elective angioplasty and stenting of the left renal artery, however she reports an issue with insurance and deductibles preventing her from getting this.   Past Medical History:  Diagnosis Date  . Allergy   . Anemia   . CAD (coronary artery disease)    non-obstructive by LHC (09/25/2013): Proximal and mid LAD serial 20%, proximal circumflex 30%, mid AV groove circumflex 30%, mid RCA mild plaque.  . Cancer ([place])   . Carotid stenosis    a. Carotid US (09/26/2011): Bilateral 1-39% ICA.  Marland Kitchen Chronic combined systolic and diastolic CHF (congestive heart failure) ([place])   . CKD (chronic kidney disease)   . Diabetes mellitus without complication ([place])   . GERD (gastroesophageal reflux disease)     . History of ovarian cancer   . Hyperlipidemia   . Hypertension   . NICM (nonischemic cardiomyopathy) ([place])    a. Echocardiogram (09/08/2013): Mild LVH, EF 30-35%, no WMA, mild LAE.  Marland Kitchen Obesity (BMI 30-39.9)   . Thyroid disease    Seen by specialist    Patient Active Problem List   Diagnosis Date Noted  . Mixed hyperlipidemia 10/26/2013  . Chronic combined systolic and diastolic heart failure ([place]) 09/28/2013  . CKD (chronic kidney disease), stage III ([place]) 09/24/2013  . Cardiomyopathy, hypertensive ([place]) 09/18/2013  . Edema 09/14/2013  . Acute combined systolic and diastolic congestive heart failure ([place]) 09/09/2013  . Hypertensive heart disease with CHF (congestive heart failure) ([place]) 09/09/2013  . Uncontrolled hypertension 09/07/2013  . Diabetes mellitus type II, uncontrolled ([place]) 09/07/2013  . Obesity (BMI 30-39.9)   . History of ovarian cancer     Past Surgical History:  Procedure Laterality Date  . LEFT HEART CATHETERIZATION WITH CORONARY ANGIOGRAM N/A 09/25/2013   Procedure: LEFT HEART CATHETERIZATION WITH CORONARY ANGIOGRAM;  Surgeon: Burnell Blanks, MD;  Location: [facility] CATH LAB;  Service: Cardiovascular;  Laterality: N/A;     OB History   No obstetric history on file.      Home Medications    Prior to Admission medications   Medication Sig Start Date End Date Taking? Authorizing Provider  amLODipine (NORVASC) 10 MG tablet TAKE 1 TABLET(10 MG) BY MOUTH DAILY 08/29/18   Copland, Gay Filler, MD  aspirin EC 81 MG  EC tablet Take 1 tablet (81 mg total) by mouth daily. 09/10/13   Janece Canterbury, MD  atorvastatin (LIPITOR) 40 MG tablet TAKE 1 TABLET BY MOUTH DAILY 08/29/18   Copland, Gay Filler, MD  carvedilol (COREG) 12.5 MG tablet Take 1 tablet (12.5 mg total) 2 (two) times daily with a meal by mouth. 07/01/17   Copland, Gay Filler, MD  glucose blood (ONE TOUCH ULTRA TEST) test strip Use to check blood sugar 3 times daily Dx code E11.65 11/20/14   Elayne Snare, MD   lisinopril (PRINIVIL,ZESTRIL) 40 MG tablet TAKE 1 TABLET(40 MG) BY MOUTH DAILY 07/07/18   Copland, Gay Filler, MD  metFORMIN (GLUCOPHAGE-XR) 500 MG 24 hr tablet TAKE 1 TABLET BY MOUTH EVERY MORNING AND 2 TABLETS BY MOUTH EVERY DAY WITH SUPPER Patient taking differently: 500 mg. TAKE 1 TABLET BY MOUTH ONCE DAILY. 07/01/17   Copland, Gay Filler, MD  ONETOUCH VERIO test strip CHECK BLOOD SUGAR THREE TIMES DAILY 03/30/18   Elayne Snare, MD  TRULICITY 0.86 PY/1.9JK SOPN INJECT 1 PEN INTO THE ABDOMINAL SKIN AS DIRECTED EVERY WEEK 08/29/18   Elayne Snare, MD    Family History Family History  Problem Relation Age of Onset  . Diabetes Father     Social History Social History   Tobacco Use  . Smoking status: Never Smoker  . Smokeless tobacco: Never Used  Substance Use Topics  . Alcohol use: No  . Drug use: No     Allergies   Patient has no known allergies.   Review of Systems Review of Systems  Constitutional: Negative for chills and fever.  HENT: Negative for congestion and sore throat.   Eyes: Negative for visual disturbance.  Respiratory: Negative for cough, chest tightness and shortness of breath.   Cardiovascular: Negative for chest pain, palpitations and leg swelling.  Gastrointestinal: Negative for abdominal pain, nausea and vomiting.  Genitourinary: Negative for dysuria and flank pain.  Musculoskeletal: Negative for back pain and myalgias.  Skin: Negative for rash.  Neurological: Positive for headaches. Negative for dizziness, syncope, light-headedness and numbness.     Physical Exam Updated Vital Signs BP (!) 220/90 (BP Location: Right Arm)   Pulse 74   Temp 98.2 F (36.8 C)   Resp 11   SpO2 100%   Physical Exam Vitals signs and nursing note reviewed.  Constitutional:      General: She is not in acute distress.    Appearance: She is well-developed.  HENT:     Head: Normocephalic and atraumatic.  Eyes:     Conjunctiva/sclera: Conjunctivae normal.     Pupils:  Pupils are equal, round, and reactive to light.  Neck:     Musculoskeletal: Normal range of motion and neck supple.  Cardiovascular:     Rate and Rhythm: Normal rate and regular rhythm.     Heart sounds: S1 normal and S2 normal. No murmur.  Pulmonary:     Effort: Pulmonary effort is normal.     Breath sounds: Normal breath sounds. No wheezing or rales.  Abdominal:     General: There is no distension.     Palpations: Abdomen is soft.     Tenderness: There is no abdominal tenderness. There is no guarding.  Musculoskeletal: Normal range of motion.        General: No deformity.  Lymphadenopathy:     Cervical: No cervical adenopathy.  Skin:    General: Skin is warm and dry.     Findings: No erythema or rash.  Neurological:  Mental Status: She is alert.     Comments: Mental Status:  Alert, oriented, thought content appropriate, able to give a coherent history. Speech fluent without evidence of aphasia. Able to follow 2 step commands without difficulty.  Cranial Nerves:  II:  Peripheral visual fields grossly normal, pupils equal, round, reactive to light III,IV, VI: ptosis not present, extra-ocular motions intact bilaterally  V,VII: smile symmetric, facial light touch sensation equal VIII: hearing grossly normal to voice  X: uvula elevates symmetrically  XI: bilateral shoulder shrug symmetric and strong XII: midline tongue extension without fassiculations Motor:  Normal tone. 5/5 in upper and lower extremities bilaterally including strong and equal grip strength and dorsiflexion/plantar flexion Sensory: light touch normal in all extremities.  Gait: normal gait and balance CV: distal pulses palpable throughout    Psychiatric:        Behavior: Behavior normal.        Thought Content: Thought content normal.        Judgment: Judgment normal.      ED Treatments / Results  Labs (all labs ordered are listed, but only abnormal results are displayed) Labs Reviewed  BASIC  METABOLIC PANEL - Abnormal; Notable for the following components:      Result Value   Glucose, Bld 102 (*)    BUN 39 (*)    Creatinine, Ser 2.13 (*)    GFR calc non Af Amer 24 (*)    GFR calc Af Amer 27 (*)    All other components within normal limits  CBC - Abnormal; Notable for the following components:   RBC 3.59 (*)    Hemoglobin 10.5 (*)    HCT 32.7 (*)    All other components within normal limits  URINALYSIS, ROUTINE W REFLEX MICROSCOPIC  I-STAT TROPONIN, ED    EKG EKG Interpretation  Date/Time:  Tuesday October 04 2018 15:43:19 EST Ventricular Rate:  77 PR Interval:    QRS Duration: 108 QT Interval:  424 QTC Calculation: 480 R Axis:   -7 Text Interpretation:  Sinus rhythm Anteroseptal infarct, age indeterminate No STEMI.  Confirmed by Nanda Quinton [phone]) on 10/04/2018 3:55:59 PM   Radiology Ct Head Wo Contrast  Result Date: 10/04/2018 CLINICAL DATA:  Hypertension, worst headache of life EXAM: CT HEAD WITHOUT CONTRAST TECHNIQUE: Contiguous axial images were obtained from the base of the skull through the vertex without intravenous contrast. COMPARISON:  None. FINDINGS: Brain: No evidence of acute infarction, hemorrhage, hydrocephalus, extra-axial collection or mass lesion/mass effect. Vascular: No hyperdense vessel or unexpected calcification. Skull: Normal. Negative for fracture or focal lesion. Sinuses/Orbits: No acute finding. Other: None. IMPRESSION: No acute intracranial pathology. No non-contrast CT findings to explain headache. Electronically Signed   By: Eddie Candle M.D.   On: 10/04/2018 17:03    Procedures Procedures (including critical care time)  Medications Ordered in ED Medications - No data to display   Initial Impression / Assessment and Plan / ED Course  I have reviewed the triage vital signs and the nursing notes.  Pertinent labs & imaging results that were available during my care of the patient were reviewed by me and considered in my medical  decision making (see chart for details).  Clinical Course as of Oct 04 1800  Tue Oct 04, 2018  1638 Spoke with Dr. Johnney Ou of [facility] who recommends observation for patient by hospital medicine and they will consult in the morning.  Recommends that ultimately 295 systolic would be a goal blood pressure for patient, and  hydralazine can be titrated because that she is only at 25 mg per dose currently.  Appreciate her involvement in the care of this patient.   [AM]  1740 Patient is reporting that she feels that she may be having a reaction to the hydralazine.  She reports that she on a daily basis feels like "my body is being set on fire by sulfuric acid" and she felt this again immediately after the injection of hydralazine.  She thinks that this is a reaction.  Will hold currently.   [AM]  5916 Spoke with Dr. Evangeline Gula of hospital medicine who will admit patient.  I appreciate her involvement in the care of this patient.   [AM]    Clinical Course User Index [AM] Albesa Seen, PA-C    Patient nontoxic-appearing, hemodynamically stable, and in no acute distress.  Blood pressure on arrival is 225/86.  On clinical exam, no signs of hypertensive emergency today.    Troponin is negative.  EKG reviewed by me demonstrates no evidence of ischemia, infarction, or arrhythmia.  Hemoglobin is stable at 10.5.  Renal function appears to be worsening greater than patient's baseline documented within the last year.  Creatinine was 2.073 weeks ago and is now 2.13 today.  CT head without contrast demonstrates no intracranial abnormality.  Per discussion with nephrology, Dr. Johnney Ou, will admit to hospital medicine for observation titration of medications.  Specifically, she recommended titration of hydralazine.  Recommends goal blood pressure of systolic 384.  Patient given intravenous hydralazine here in the emergency department with blood pressure of 180/61.  Will not lower further here in emergency  department.  This is a shared visit with Dr. Nanda Quinton. Patient was independently evaluated by this attending physician. Attending physician consulted in evaluation and admission management.  Final Clinical Impressions(s) / ED Diagnoses   Final diagnoses:  NSTEMI (non-ST elevated myocardial infarction) [facility])  Chest pain, unspecified type    ED Discharge Orders    None       Tamala Julian 10/04/18 1802    Margette Fast, MD 10/05/18 1034

## 2018-10-04 NOTE — ED Triage Notes (Signed)
Pt sent here from [facility] for hypertension, pt SBP in 200s. Pt is taking 5 different medications for BP and pt states compliance with medications. Pt also reports intermittent head pains but states "itr is not a headache". Pt a.o, nad noted at this time.

## 2018-10-05 ENCOUNTER — Observation Stay (HOSPITAL_COMMUNITY): Payer: BC Managed Care – PPO

## 2018-10-05 DIAGNOSIS — Z833 Family history of diabetes mellitus: Secondary | ICD-10-CM | POA: Diagnosis not present

## 2018-10-05 DIAGNOSIS — E1165 Type 2 diabetes mellitus with hyperglycemia: Secondary | ICD-10-CM

## 2018-10-05 DIAGNOSIS — Z6830 Body mass index (BMI) 30.0-30.9, adult: Secondary | ICD-10-CM | POA: Diagnosis not present

## 2018-10-05 DIAGNOSIS — Z7982 Long term (current) use of aspirin: Secondary | ICD-10-CM | POA: Diagnosis not present

## 2018-10-05 DIAGNOSIS — E785 Hyperlipidemia, unspecified: Secondary | ICD-10-CM | POA: Diagnosis present

## 2018-10-05 DIAGNOSIS — I5042 Chronic combined systolic (congestive) and diastolic (congestive) heart failure: Secondary | ICD-10-CM | POA: Diagnosis present

## 2018-10-05 DIAGNOSIS — Z7984 Long term (current) use of oral hypoglycemic drugs: Secondary | ICD-10-CM | POA: Diagnosis not present

## 2018-10-05 DIAGNOSIS — N183 Chronic kidney disease, stage 3 (moderate): Secondary | ICD-10-CM

## 2018-10-05 DIAGNOSIS — I160 Hypertensive urgency: Secondary | ICD-10-CM | POA: Diagnosis present

## 2018-10-05 DIAGNOSIS — I2510 Atherosclerotic heart disease of native coronary artery without angina pectoris: Secondary | ICD-10-CM | POA: Diagnosis present

## 2018-10-05 DIAGNOSIS — Z8249 Family history of ischemic heart disease and other diseases of the circulatory system: Secondary | ICD-10-CM | POA: Diagnosis not present

## 2018-10-05 DIAGNOSIS — I701 Atherosclerosis of renal artery: Secondary | ICD-10-CM | POA: Diagnosis present

## 2018-10-05 DIAGNOSIS — R0989 Other specified symptoms and signs involving the circulatory and respiratory systems: Secondary | ICD-10-CM | POA: Diagnosis not present

## 2018-10-05 DIAGNOSIS — E669 Obesity, unspecified: Secondary | ICD-10-CM | POA: Diagnosis present

## 2018-10-05 DIAGNOSIS — Z8543 Personal history of malignant neoplasm of ovary: Secondary | ICD-10-CM | POA: Diagnosis not present

## 2018-10-05 DIAGNOSIS — I428 Other cardiomyopathies: Secondary | ICD-10-CM | POA: Diagnosis present

## 2018-10-05 DIAGNOSIS — Z79899 Other long term (current) drug therapy: Secondary | ICD-10-CM | POA: Diagnosis not present

## 2018-10-05 DIAGNOSIS — E1122 Type 2 diabetes mellitus with diabetic chronic kidney disease: Secondary | ICD-10-CM | POA: Diagnosis present

## 2018-10-05 DIAGNOSIS — I10 Essential (primary) hypertension: Secondary | ICD-10-CM | POA: Diagnosis not present

## 2018-10-05 DIAGNOSIS — I130 Hypertensive heart and chronic kidney disease with heart failure and stage 1 through stage 4 chronic kidney disease, or unspecified chronic kidney disease: Secondary | ICD-10-CM | POA: Diagnosis present

## 2018-10-05 DIAGNOSIS — K219 Gastro-esophageal reflux disease without esophagitis: Secondary | ICD-10-CM | POA: Diagnosis present

## 2018-10-05 DIAGNOSIS — N184 Chronic kidney disease, stage 4 (severe): Secondary | ICD-10-CM | POA: Diagnosis present

## 2018-10-05 LAB — GLUCOSE, CAPILLARY
GLUCOSE-CAPILLARY: 78 mg/dL (ref 70–99)
Glucose-Capillary: 84 mg/dL (ref 70–99)
Glucose-Capillary: 138 mg/dL — ABNORMAL HIGH (ref 70–99)
Glucose-Capillary: 128 mg/dL — ABNORMAL HIGH (ref 70–99)

## 2018-10-05 LAB — BASIC METABOLIC PANEL
ANION GAP: 9 (ref 5–15)
BUN: 32 mg/dL — ABNORMAL HIGH (ref 8–23)
CO2: 22 mmol/L (ref 22–32)
Calcium: 9.2 mg/dL (ref 8.9–10.3)
Chloride: 110 mmol/L (ref 98–111)
Creatinine, Ser: 1.82 mg/dL — ABNORMAL HIGH (ref 0.44–1.00)
GFR calc Af Amer: 33 mL/min — ABNORMAL LOW (ref >60)
GFR calc non Af Amer: 28 mL/min — ABNORMAL LOW (ref >60)
GLUCOSE: 99 mg/dL (ref 70–99)
POTASSIUM: 4.1 mmol/L (ref 3.5–5.1)
Sodium: 141 mmol/L (ref 135–145)

## 2018-10-05 LAB — MRSA PCR SCREENING: MRSA by PCR: NEGATIVE

## 2018-10-05 LAB — HIV ANTIBODY (ROUTINE TESTING W REFLEX): HIV Screen 4th Generation wRfx: NONREACTIVE

## 2018-10-05 MED ORDER — CLONIDINE HCL 0.1 MG PO TABS
0.1000 mg | ORAL_TABLET | Freq: Three times a day (TID) | ORAL | Status: DC
  Administered 2018-10-05 (×3): 0.1 mg via ORAL
  Filled 2018-10-05 (×3): qty 1

## 2018-10-05 MED ORDER — SODIUM CHLORIDE 0.9 % IV SOLN
INTRAVENOUS | Status: DC
  Administered 2018-10-05 (×2): via INTRAVENOUS

## 2018-10-05 NOTE — Progress Notes (Signed)
Carotid duplex       has been completed. Preliminary results can be found under CV proc through chart review. Sanita Estrada, BS, RDMS, RVT   

## 2018-10-05 NOTE — Consult Note (Addendum)
Hospital Consult    Reason for Consult:  Renal artery stenosis Requesting Physician:  Johnney Ou MRN #:  [id number]  History of Present Illness: This is a 68 y.o. female who was seen by Dr. Donnetta Hutching on 09/06/2018 for left renal artery stenosis.  She has a long standing hx of hypertension and was on five different medications.  She had a duplex in January 2020, which revealed renal artery stenosis had progressed from 2016.  It was recommended she undergo arteriography with possible stenting/angioplasty but she did not schedule this.  She states that she did not schedule this due to financial issues.    She does have CKD III and BUN/creatinine today is 32/1.82.   She presented to the hospital yesterday after going to the dentist and her systolic blood pressure was over 200.  It was recommended she present to the ER for further evaluation of her blood pressure.  She states that the bottom number never gets above 80 but on this day it was well over 100.  She states that she sometimes will feel a pounding on the left side of her head and if that goes away, she feels pressure on her neck.  She has family hx of hypertension with her father, who is 84.    She was given hydralazine but had an allergic reaction and this is being held.    The pt is on a statin for cholesterol management.  She is on a CCB, ACEI, BB for blood pressure management.  She states she had a heart catheterization a few years ago via her right arm.   She states that about 5 years ago, she lost ~ 100 lbs by changing her diet and she was able to come off her Metformin and just take the Trulicity.  Her hgbA1c in January was 6.6.   She has hx of open abdominal surgery for ovarian cancer about 30 years ago.   Past Medical History:  Diagnosis Date  . Allergy   . Anemia   . CAD (coronary artery disease)    non-obstructive by LHC (09/25/2013): Proximal and mid LAD serial 20%, proximal circumflex 30%, mid AV groove circumflex 30%, mid RCA mild  plaque.  . Cancer ([place])   . Carotid stenosis    a. Carotid US (09/26/2011): Bilateral 1-39% ICA.  Marland Kitchen Chronic combined systolic and diastolic CHF (congestive heart failure) ([place])   . CKD (chronic kidney disease)   . Diabetes mellitus without complication ([place])   . GERD (gastroesophageal reflux disease)   . History of ovarian cancer   . Hyperlipidemia   . Hypertension   . NICM (nonischemic cardiomyopathy) ([place])    a. Echocardiogram (09/08/2013): Mild LVH, EF 30-35%, no WMA, mild LAE.  Marland Kitchen Obesity (BMI 30-39.9)   . Thyroid disease    Seen by specialist    Past Surgical History:  Procedure Laterality Date  . LEFT HEART CATHETERIZATION WITH CORONARY ANGIOGRAM N/A 09/25/2013   Procedure: LEFT HEART CATHETERIZATION WITH CORONARY ANGIOGRAM;  Surgeon: Burnell Blanks, MD;  Location: [facility] CATH LAB;  Service: Cardiovascular;  Laterality: N/A;    Allergies  Allergen Reactions  . Hydralazine Hcl Itching and Other (See Comments)    ENTIRE BODY = burning sensation, also in the eyes (they have become very sensitive to light)    Prior to Admission medications   Medication Sig Start Date End Date Taking? Authorizing Provider  amLODipine (NORVASC) 10 MG tablet TAKE 1 TABLET(10 MG) BY MOUTH DAILY Patient taking differently: 10 mg daily.  08/29/18  Yes Copland, Gay Filler, MD  aspirin EC 81 MG EC tablet Take 1 tablet (81 mg total) by mouth daily. 09/10/13  Yes Short, Noah Delaine, MD  atorvastatin (LIPITOR) 40 MG tablet TAKE 1 TABLET BY MOUTH DAILY Patient taking differently: Take 40 mg by mouth every evening.  08/29/18  Yes Copland, Gay Filler, MD  carvedilol (COREG) 25 MG tablet Take 25 mg by mouth 2 (two) times daily with a meal.   Yes [provider]  Cholecalciferol (VITAMIN D3) 25 MCG (1000 UT) CAPS Take 2,000 Units by mouth daily with breakfast.   Yes [provider]  CINNAMON PO Take 1 capsule by mouth daily with breakfast.   Yes [provider]  lisinopril (PRINIVIL,ZESTRIL)  40 MG tablet TAKE 1 TABLET(40 MG) BY MOUTH DAILY Patient taking differently: Take 40 mg by mouth daily.  07/07/18  Yes Copland, Gay Filler, MD  Multiple Vitamins-Minerals (MULTIVITAMIN GUMMIES ADULT) CHEW Chew 1 tablet by mouth daily with breakfast.   Yes [provider]  Omega-3 Fatty Acids (FISH OIL PO) Take 1 capsule by mouth daily with breakfast.   Yes [provider]  TRULICITY 2.95 AO/1.3YQ SOPN INJECT 1 PEN INTO THE ABDOMINAL SKIN AS DIRECTED EVERY WEEK Patient taking differently: Inject 0.75 mg into the skin every Sunday.  08/29/18  Yes Elayne Snare, MD  TURMERIC PO Take 1 capsule by mouth daily with breakfast.   Yes [provider]  VITAMIN E PO Take 1 capsule by mouth daily with breakfast.   Yes [provider]  glucose blood (ONE TOUCH ULTRA TEST) test strip Use to check blood sugar 3 times daily Dx code E11.65 11/20/14   Elayne Snare, MD  indapamide (LOZOL) 2.5 MG tablet  09/27/18   [provider]  ONETOUCH VERIO test strip [place] Patient taking differently: 1 each by Other route 3 (three) times daily.  03/30/18   Elayne Snare, MD    Social History   Socioeconomic History  . Marital status: Single    Spouse name: Not on file  . Number of children: 0  . Years of education: Not on file  . Highest education level: Not on file  Occupational History    Employer: [employer]  Social Needs  . Financial resource strain: Not on file  . Food insecurity:    Worry: Not on file    Inability: Not on file  . Transportation needs:    Medical: Not on file    Non-medical: Not on file  Tobacco Use  . Smoking status: Never Smoker  . Smokeless tobacco: Never Used  Substance and Sexual Activity  . Alcohol use: No  . Drug use: No  . Sexual activity: Not on file  Lifestyle  . Physical activity:    Days per week: Not on file    Minutes per session: Not on file  . Stress: Not on file  Relationships  . Social  connections:    Talks on phone: Not on file    Gets together: Not on file    Attends religious service: Not on file    Active member of club or organization: Not on file    Attends meetings of clubs or organizations: Not on file    Relationship status: Not on file  . Intimate partner violence:    Fear of current or ex partner: Not on file    Emotionally abused: Not on file    Physically abused: Not  on file    Forced sexual activity: Not on file  Other Topics Concern  . Not on file  Social History Narrative   Works at [employer]   Patient lives at home alone.    Patient has no children.    Patient patient is right handed.    Patient is single.      Family History  Problem Relation Age of Onset  . Diabetes Father   . Hypertension Father   . Hypertension Maternal Grandfather     ROS: [x]  Positive   [ ]  Negative   [ ]  All sytems reviewed and are negative  Cardiac: [x]  CAD [x]  NICM [x]  high blood pressure  Vascular: []  pain in legs while walking []  pain in legs at rest []  pain in legs at night []  non-healing ulcers []  hx of DVT []  swelling in legs  Pulmonary: []  COPD  Neurologic: []  weakness in []  arms []  legs []  numbness in []  arms []  legs []  hx of CVA []  mini stroke [] difficulty speaking or slurred speech []  temporary loss of vision in one eye []  dizziness  Hematologic: [x]  anemia [x]  hx ovarian cancer ~ 30 years ago  Endocrine:   [x]  diabetes [x]  thyroid disease  GI [x]  GERD  GU: [x]  CKD/renal failure []  HD--[]  M/W/F or []  T/T/S  Psychiatric: []  anxiety [x]  depression  Musculoskeletal: []  arthritis []  joint pain  Integumentary: []  rashes []  ulcers  Constitutional: []  fever []  chills   Physical Examination  Vitals:   10/05/18 0826 10/05/18 1008  BP: (!) 213/73 (!) 168/68  Pulse:    Resp:    Temp:    SpO2:     Body mass index is 30.35 kg/m.  General:  WDWN in NAD Gait: Not observed HENT: WNL, normocephalic Pulmonary: normal  non-labored breathing, without Rales, rhonchi,  wheezing Cardiac: regular, without  Murmurs, rubs or gallops; with right carotid bruit Abdomen: obese soft, NT/ND, no masses; well healed laparotomy scar. Skin: without rashes Vascular Exam/Pulses:  Right Left  Radial 2+ (normal) 2+ (normal)  Ulnar Unable to palpate  Unable to palpate   Femoral Unable to palpate 2+ (normal)  DP Unable to palpate  2+ (normal)  PT 2+ (normal) 2+ (normal)   Extremities: without ischemic changes, without Gangrene , without cellulitis; without open wounds;  Musculoskeletal: no muscle wasting or atrophy  Neurologic: A&O X 3;  No focal weakness or paresthesias are detected; speech is fluent/normal Psychiatric:  The pt has Normal affect.   CBC    Component Value Date/Time   WBC 6.5 10/04/2018 1352   RBC 3.59 (L) 10/04/2018 1352   HGB 10.5 (L) 10/04/2018 1352   HCT 32.7 (L) 10/04/2018 1352   PLT 258 10/04/2018 1352   MCV 91.1 10/04/2018 1352   MCH 29.2 10/04/2018 1352   MCHC 32.1 10/04/2018 1352   RDW 13.2 10/04/2018 1352   LYMPHSABS 1.9 09/18/2013 1110   MONOABS 0.4 09/18/2013 1110   EOSABS 0.1 09/18/2013 1110   BASOSABS 0.0 09/18/2013 1110    BMET    Component Value Date/Time   NA 141 10/05/2018 0529   NA 138 07/25/2018   K 4.1 10/05/2018 0529   CL 110 10/05/2018 0529   CO2 22 10/05/2018 0529   GLUCOSE 99 10/05/2018 0529   BUN 32 (H) 10/05/2018 0529   BUN 41 (A) 07/25/2018   CREATININE 1.82 (H) 10/05/2018 0529   CALCIUM 9.2 10/05/2018 0529   GFRNONAA 28 (L) 10/05/2018 0529   GFRAA 33 (L) 10/05/2018  6153    COAGS: Lab Results  Component Value Date   INR 1.00 09/25/2013   INR 0.89 09/07/2013     Non-Invasive Vascular Imaging:   Renal artery duplex 09/12/17: Right: RRV flow present. 1-59% stenosis of the right renal artery.        Cyst(s) noted. Normal size right kidney. Normal cortical        thickness of right kidney. Left:  LRV flow present. Evidence of a > 60% stenosis in  the left        renal artery. Normal size of left kidney. Normal cortical        thickness of the left kidney   Statin:  Yes.   Beta Blocker:  Yes.   Aspirin:  Yes.   ACEI:  Yes.   ARB:  No. CCB use:  Yes Other antiplatelets/anticoagulants:  Yes.   Lovenox (DVT prophylaxis)   ASSESSMENT/PLAN: This is a 68 y.o. female admitted with hypertensive urgency with known left renal artery stenosis with CKD III.    -pt will need arteriogram as recommended by Dr. Donnetta Hutching in January with possible renal artery stenting/angioplasty.  She will need IVF hydration prior to procedure.  Once procedure is scheduled, would recommend advisor from financial department come and speak with pt as this is concerning and important to the pt about costs and payment plan.   -right carotid bruit-will order duplex.  She had duplex in 2016, which revealed less than 50% ICA stenosis bilaterally -Dr. Donnetta Hutching will be by to see the pt later today.   Leontine Locket, PA-C [facility] [phone]  Addendum:  Spoke with pt about procedure being scheduled for tomorrow at 0730 and the need for IV hydration to protect renal function.  Discussed this at length with pt and she is under the impression she will be on dialysis.  I discussed with her the fact that contrast is nephrotoxic and the IV hydration is to protect her kidneys but there is a risk of worsening renal function.   Also, discussed with pt about right carotid bruit and need for carotid duplex.  Explained that this is not what is causing the pressure in her neck.  She is in agreement to proceed with duplex. She will need IV hydration overnight.  Npo after MN.  Discussed with Dr. Jeffie Pollock.  Also recommend getting financial advisor to come speak with pt about costs/insurance/payments as this is a big concern for her.   Leontine Locket, [facility] 10/05/2018 12:38 PM  I have examined the patient, reviewed and agree with above.  Had seen the patient in our patient  consult 3 weeks ago and suggested arteriography and possible renal intervention.  Patient now presents with uncontrolled hypertension on multiple meds.  We will plan for full intervention tomorrow if indicated.  Again discussed with the patient who understands  Curt Jews, MD 10/05/2018 2:13 PM

## 2018-10-05 NOTE — Progress Notes (Signed)
Progress Note    Teresa Poole  IRJ:[id number] DOB: March 16, 1951  DOA: 10/04/2018 PCP: No primary care provider on file.    Brief Narrative:     Medical records reviewed and are as summarized below:  Teresa Poole is an 68 y.o. female with medical history significant of chronic kidney disease stage II, diabetes mellitus without complication, hyperlipidemia, hypertension, nonischemic cardiomyopathy with an EF of 30 to 35%, obesity, and coronary artery disease which was nonobstructive who has had a lifetime of difficulty managing her blood pressure.   Assessment/Plan:   Principal Problem:   Hypertensive urgency Active Problems:   Diabetes mellitus type II, uncontrolled ([place])   Acute combined systolic and diastolic congestive heart failure (HCC)   CKD (chronic kidney disease), stage III (H[facility])  Hypertensive urgency:  -increase her carvedilol to 25 mg p.o. twice daily.  -adjust BP medications for better control -appreciate renal's consult -vascular consult as well-- plan for stent in AM   Diabetes mellitus type 2 uncontrolled  -SSI  Bruit -carotid U/S ordered   combined systolic and diastolic congestive heart failure: -monitor with gently hydration  Chronic kidney disease stage III:  -hydration prior to planned stent in AM  obesity Body mass index is 30.35 kg/m.   Family Communication/Anticipated D/C date and plan/Code Status   DVT prophylaxis: Lovenox ordered. Code Status: Full Code.  Family Communication:  Disposition Plan: stent planned for early AM   Medical Consultants:    Renal  vascular  Subjective:   Worried about cost of hospitalizations  Objective:    Vitals:   10/05/18 0545 10/05/18 0826 10/05/18 1008 10/05/18 1257  BP: (!) 154/78 (!) 213/73 (!) 168/68 (!) 171/70  Pulse: 78   66  Resp: 18   18  Temp: 98.2 F (36.8 C)   (!) 97.5 F (36.4 C)  TempSrc: Oral   Oral  SpO2: 97%   98%  Weight:      Height:         Intake/Output Summary (Last 24 hours) at 10/05/2018 1406 Last data filed at 10/05/2018 0900 Gross per 24 hour  Intake 1028.9 ml  Output -  Net 1028.9 ml   Filed Weights   10/04/18 2001  Weight: 87.9 kg    Exam: In room, walking around rrr Clear, no wheezing Alert Anxious appearing  Data Reviewed:   I have personally reviewed following labs and imaging studies:  Labs: Labs show the following:   Basic Metabolic Panel: Recent Labs  Lab 10/04/18 1352 10/05/18 0529  NA 142 141  K 4.6 4.1  CL 107 110  CO2 23 22  GLUCOSE 102* 99  BUN 39* 32*  CREATININE 2.13* 1.82*  CALCIUM 9.5 9.2   GFR Estimated Creatinine Clearance: 34.6 mL/min (A) (by C-G formula based on SCr of 1.82 mg/dL (H)). Liver Function Tests: No results for input(s): AST, ALT, ALKPHOS, BILITOT, PROT, ALBUMIN in the last 168 hours. No results for input(s): LIPASE, AMYLASE in the last 168 hours. No results for input(s): AMMONIA in the last 168 hours. Coagulation profile No results for input(s): INR, PROTIME in the last 168 hours.  CBC: Recent Labs  Lab 10/04/18 1352  WBC 6.5  HGB 10.5*  HCT 32.7*  MCV 91.1  PLT 258   Cardiac Enzymes: No results for input(s): CKTOTAL, CKMB, CKMBINDEX, TROPONINI in the last 168 hours. BNP (last 3 results) No results for input(s): PROBNP in the last 8760 hours. CBG: Recent Labs  Lab 10/04/18 2108 10/05/18 0636 10/05/18  1122  GLUCAP 108* 84 138*   D-Dimer: No results for input(s): DDIMER in the last 72 hours. Hgb A1c: No results for input(s): HGBA1C in the last 72 hours. Lipid Profile: No results for input(s): CHOL, HDL, LDLCALC, TRIG, CHOLHDL, LDLDIRECT in the last 72 hours. Thyroid function studies: No results for input(s): TSH, T4TOTAL, T3FREE, THYROIDAB in the last 72 hours.  Invalid input(s): FREET3 Anemia work up: No results for input(s): VITAMINB12, FOLATE, FERRITIN, TIBC, IRON, RETICCTPCT in the last 72 hours. Sepsis Labs: Recent Labs   Lab 10/04/18 1352  WBC 6.5    Microbiology No results found for this or any previous visit (from the past 240 hour(s)).  Procedures and diagnostic studies:  Ct Head Wo Contrast  Result Date: 10/04/2018 CLINICAL DATA:  Hypertension, worst headache of life EXAM: CT HEAD WITHOUT CONTRAST TECHNIQUE: Contiguous axial images were obtained from the base of the skull through the vertex without intravenous contrast. COMPARISON:  None. FINDINGS: Brain: No evidence of acute infarction, hemorrhage, hydrocephalus, extra-axial collection or mass lesion/mass effect. Vascular: No hyperdense vessel or unexpected calcification. Skull: Normal. Negative for fracture or focal lesion. Sinuses/Orbits: No acute finding. Other: None. IMPRESSION: No acute intracranial pathology. No non-contrast CT findings to explain headache. Electronically Signed   By: Eddie Candle M.D.   On: 10/04/2018 17:03    Medications:   . amLODipine  10 mg Oral Daily  . atorvastatin  40 mg Oral QPM  . carvedilol  25 mg Oral BID WC  . cholecalciferol  2,000 Units Oral Q breakfast  . cloNIDine  0.1 mg Oral TID  . diphenhydrAMINE  25 mg Oral Once  . enoxaparin (LOVENOX) injection  40 mg Subcutaneous Q24H  . indapamide  2.5 mg Oral Daily  . insulin aspart  0-15 Units Subcutaneous TID WC  . lisinopril  40 mg Oral Daily  . sodium chloride flush  3 mL Intravenous Q12H   Continuous Infusions: . sodium chloride 75 mL/hr at 10/05/18 1319     LOS: 0 days   Geradine Girt  [facility]   How to contact the [facility] Attending or Consulting provider [place] or covering provider during after hours [place], for this patient?  1. Check the care team in [facility] and look for a) attending/consulting [facility] provider listed and b) the [facility] team listed 2. Log into [email or web address] and use [place]'s universal password to access. If you do not have the password, please contact the hospital operator. 3. Locate the [facility] provider you are looking for under  [facility] and page to a number that you can be directly reached. 4. If you still have difficulty reaching the provider, please page the [facility] (Director on Call) for the Hospitalists listed on amion for assistance.  10/05/2018, 2:06 PM

## 2018-10-05 NOTE — Consult Note (Signed)
[facility] Renal Consultation Note  Requesting MD: Dr. Eliseo Squires Indication for Consultation: HTN  HPI: Teresa Poole is a 68 y.o. female with HTN, DM, obesity, NICM EF 30-35%, nonobstructive CAD who is seen for evaluation of HTN.    She is followed by Dr. Joelyn Oms at [facility] office and has been treated for resistant hypertension, currently with 5 medications.  Evaluation has included renal artery doppler which suggests significant RAS.  She has been evaluated and recommended for renal angiogram with possible stent but she cancelled due to associated cost concerns.    She went to dentist yesterday and was found to have BP ~200/100 and she was sent to the ED.  She was admitted to observation for HTN.  Her home medications were resumed and she rec'd hydralazine 10 IV.  Initially BPs improved into the 150-170s but this AM measured to 213/73 which she tells me was taken calm, sitting up in bed.    She is quite a difficult historian and frequently tangential but denies currently symptoms assoc with HTN.  Says BP was generally 150s, sometimes 120s until this week.  No changes to meds, no decongestants, drugs, EtOH.  She says she is 100% adherent to med regimen, eats low na diet.  She showed me her fill history on [facility] website which suggests she is reasonably adherent.  Amlodipine 10, hydralazine 25, lisionpril 40, indapamide 1.25, coreg 25 BID.  She felt itchy after IV hydralazine last night so her po hydralazine has been held as well.   Creatinine  Date/Time Value Ref Range Status  07/25/2018 1.8 (A) 0.5 - 1.1 Final   Creatinine, Ser  Date/Time Value Ref Range Status  10/05/2018 05:29 AM 1.82 (H) 0.44 - 1.00 mg/dL Final  10/04/2018 01:52 PM 2.13 (H) 0.44 - 1.00 mg/dL Final  09/07/2018 10:56 AM 2.07 (H) 0.40 - 1.20 mg/dL Final  05/04/2018 11:19 AM 1.66 (H) 0.40 - 1.20 mg/dL Final  01/21/2018 10:49 AM 1.70 (H) 0.40 - 1.20 mg/dL Final  09/22/2017 10:41 AM 1.53 (H) 0.40 - 1.20 mg/dL  Final  05/19/2017 01:51 PM 1.37 (H) 0.40 - 1.20 mg/dL Final  03/02/2017 11:09 AM 1.43 (H) 0.40 - 1.20 mg/dL Final  12/01/2016 02:04 PM 1.34 (H) 0.40 - 1.20 mg/dL Final  09/08/2016 01:37 PM 1.33 (H) 0.40 - 1.20 mg/dL Final  06/08/2016 01:42 PM 1.42 (H) 0.40 - 1.20 mg/dL Final  09/11/2015 01:15 PM 1.65 (H) 0.40 - 1.20 mg/dL Final  11/08/2014 02:17 PM 1.55 (H) 0.40 - 1.20 mg/dL Final  10/13/2013 12:21 PM 1.6 (H) 0.4 - 1.2 mg/dL Final  10/06/2013 12:05 PM 1.7 (H) 0.4 - 1.2 mg/dL Final  09/28/2013 12:29 PM 1.5 (H) 0.4 - 1.2 mg/dL Final  09/26/2013 05:35 AM 1.39 (H) 0.50 - 1.10 mg/dL Final  09/25/2013 04:22 AM 1.21 (H) 0.50 - 1.10 mg/dL Final  09/24/2013 04:14 AM 1.33 (H) 0.50 - 1.10 mg/dL Final  09/23/2013 03:12 AM 1.49 (H) 0.50 - 1.10 mg/dL Final  09/21/2013 11:08 AM 1.47 (H) 0.50 - 1.10 mg/dL Final  09/19/2013 05:30 AM 1.24 (H) 0.50 - 1.10 mg/dL Final  09/18/2013 07:16 PM 1.14 (H) 0.50 - 1.10 mg/dL Final  09/18/2013 11:10 AM 1.08 0.50 - 1.10 mg/dL Final  09/14/2013 09:59 AM 1.4 (H) 0.4 - 1.2 mg/dL Final  09/10/2013 05:35 AM 1.29 (H) 0.50 - 1.10 mg/dL Final  09/09/2013 05:05 AM 1.34 (H) 0.50 - 1.10 mg/dL Final  09/08/2013 02:40 AM 1.00 0.50 - 1.10 mg/dL Final  09/07/2013 03:34 PM 1.05  0.50 - 1.10 mg/dL Final     PMHx:   Past Medical History:  Diagnosis Date  . Allergy   . Anemia   . CAD (coronary artery disease)    non-obstructive by LHC (09/25/2013): Proximal and mid LAD serial 20%, proximal circumflex 30%, mid AV groove circumflex 30%, mid RCA mild plaque.  . Cancer ([place])   . Carotid stenosis    a. Carotid US (09/26/2011): Bilateral 1-39% ICA.  Marland Kitchen Chronic combined systolic and diastolic CHF (congestive heart failure) ([place])   . CKD (chronic kidney disease)   . Diabetes mellitus without complication ([place])   . GERD (gastroesophageal reflux disease)   . History of ovarian cancer   . Hyperlipidemia   . Hypertension   . NICM (nonischemic cardiomyopathy) ([place])    a. Echocardiogram  (09/08/2013): Mild LVH, EF 30-35%, no WMA, mild LAE.  Marland Kitchen Obesity (BMI 30-39.9)   . Thyroid disease    Seen by specialist    Past Surgical History:  Procedure Laterality Date  . LEFT HEART CATHETERIZATION WITH CORONARY ANGIOGRAM N/A 09/25/2013   Procedure: LEFT HEART CATHETERIZATION WITH CORONARY ANGIOGRAM;  Surgeon: Burnell Blanks, MD;  Location: [facility] CATH LAB;  Service: Cardiovascular;  Laterality: N/A;    Family Hx:  Family History  Problem Relation Age of Onset  . Diabetes Father   . Hypertension Father   . Hypertension Maternal Grandfather     Social History:  reports that she has never smoked. She has never used smokeless tobacco. She reports that she does not drink alcohol or use drugs.  Allergies:  Allergies  Allergen Reactions  . Hydralazine Hcl Itching and Other (See Comments)    ENTIRE BODY = burning sensation, also in the eyes (they have become very sensitive to light)    Medications: Prior to Admission medications   Medication Sig Start Date End Date Taking? Authorizing Provider  amLODipine (NORVASC) 10 MG tablet TAKE 1 TABLET(10 MG) BY MOUTH DAILY Patient taking differently: 10 mg daily.  08/29/18  Yes Copland, Gay Filler, MD  aspirin EC 81 MG EC tablet Take 1 tablet (81 mg total) by mouth daily. 09/10/13  Yes Short, Noah Delaine, MD  atorvastatin (LIPITOR) 40 MG tablet TAKE 1 TABLET BY MOUTH DAILY Patient taking differently: Take 40 mg by mouth every evening.  08/29/18  Yes Copland, Gay Filler, MD  carvedilol (COREG) 25 MG tablet Take 25 mg by mouth 2 (two) times daily with a meal.   Yes [provider]  Cholecalciferol (VITAMIN D3) 25 MCG (1000 UT) CAPS Take 2,000 Units by mouth daily with breakfast.   Yes [provider]  CINNAMON PO Take 1 capsule by mouth daily with breakfast.   Yes [provider]  lisinopril (PRINIVIL,ZESTRIL) 40 MG tablet TAKE 1 TABLET(40 MG) BY MOUTH DAILY Patient taking differently: Take 40 mg by mouth daily.   07/07/18  Yes Copland, Gay Filler, MD  Multiple Vitamins-Minerals (MULTIVITAMIN GUMMIES ADULT) CHEW Chew 1 tablet by mouth daily with breakfast.   Yes [provider]  Omega-3 Fatty Acids (FISH OIL PO) Take 1 capsule by mouth daily with breakfast.   Yes [provider]  TRULICITY 2.68 TM/1.9QQ SOPN INJECT 1 PEN INTO THE ABDOMINAL SKIN AS DIRECTED EVERY WEEK Patient taking differently: Inject 0.75 mg into the skin every Sunday.  08/29/18  Yes Elayne Snare, MD  TURMERIC PO Take 1 capsule by mouth daily with breakfast.   Yes [provider]  VITAMIN E PO Take 1 capsule by mouth daily with  breakfast.   Yes [provider]  glucose blood (ONE TOUCH ULTRA TEST) test strip Use to check blood sugar 3 times daily Dx code E11.65 11/20/14   Elayne Snare, MD  indapamide (LOZOL) 2.5 MG tablet  09/27/18   [provider]  ONETOUCH VERIO test strip [place] Patient taking differently: 1 each by Other route 3 (three) times daily.  03/30/18   Elayne Snare, MD    I have reviewed the patient's current medications.  Labs:  Results for orders placed or performed during the hospital encounter of 10/04/18 (from the past 48 hour(s))  Basic metabolic panel     Status: Abnormal   Collection Time: 10/04/18  1:52 PM  Result Value Ref Range   Sodium 142 135 - 145 mmol/L   Potassium 4.6 3.5 - 5.1 mmol/L   Chloride 107 98 - 111 mmol/L   CO2 23 22 - 32 mmol/L   Glucose, Bld 102 (H) 70 - 99 mg/dL   BUN 39 (H) 8 - 23 mg/dL   Creatinine, Ser 2.13 (H) 0.44 - 1.00 mg/dL   Calcium 9.5 8.9 - 10.3 mg/dL   GFR calc non Af Amer 24 (L) >60 mL/min   GFR calc Af Amer 27 (L) >60 mL/min   Anion gap 12 5 - 15    Comment: Performed at [facility], [address], [place], [place] [address]  CBC     Status: Abnormal   Collection Time: 10/04/18  1:52 PM  Result Value Ref Range   WBC 6.5 4.0 - 10.5 K/uL   RBC 3.59 (L) 3.87 - 5.11 MIL/uL   Hemoglobin 10.5 (L) 12.0 -  15.0 g/dL   HCT 32.7 (L) 36.0 - 46.0 %   MCV 91.1 80.0 - 100.0 fL   MCH 29.2 26.0 - 34.0 pg   MCHC 32.1 30.0 - 36.0 g/dL   RDW 13.2 11.5 - 15.5 %   Platelets 258 150 - 400 K/uL   nRBC 0.0 0.0 - 0.2 %    Comment: Performed at [facility], [place] [address], [place], [place] [address]  I-Stat Troponin, ED (not at [facility])     Status: None   Collection Time: 10/04/18  3:57 PM  Result Value Ref Range   Troponin i, poc 0.01 0.00 - 0.08 ng/mL   Comment 3            Comment: Due to the release kinetics of cTnI, a negative result within the first hours of the onset of symptoms does not rule out myocardial infarction with certainty. If myocardial infarction is still suspected, repeat the test at appropriate intervals.   Urinalysis, Routine w reflex microscopic     Status: Abnormal   Collection Time: 10/04/18  5:50 PM  Result Value Ref Range   Color, Urine STRAW (A) YELLOW   APPearance CLEAR CLEAR   Specific Gravity, Urine 1.012 1.005 - 1.030   pH 5.0 5.0 - 8.0   Glucose, UA NEGATIVE NEGATIVE mg/dL   Hgb urine dipstick NEGATIVE NEGATIVE   Bilirubin Urine NEGATIVE NEGATIVE   Ketones, ur NEGATIVE NEGATIVE mg/dL   Protein, ur 100 (A) NEGATIVE mg/dL   Nitrite NEGATIVE NEGATIVE   Leukocytes,Ua SMALL (A) NEGATIVE   RBC / HPF 0-5 0 - 5 RBC/hpf   WBC, UA 11-20 0 - 5 WBC/hpf   Bacteria, UA RARE (A) NONE SEEN   Squamous Epithelial / LPF 0-5 0 - 5    Comment: Performed at  [facility], [place] [address], [place], [place] [address]  Glucose, capillary     Status: Abnormal   Collection Time: 10/04/18  9:08 PM  Result Value Ref Range   Glucose-Capillary 108 (H) 70 - 99 mg/dL  Basic metabolic panel     Status: Abnormal   Collection Time: 10/05/18  5:29 AM  Result Value Ref Range   Sodium 141 135 - 145 mmol/L   Potassium 4.1 3.5 - 5.1 mmol/L   Chloride 110 98 - 111 mmol/L   CO2 22 22 - 32 mmol/L   Glucose, Bld 99 70 - 99 mg/dL   BUN 32 (H) 8 - 23 mg/dL   Creatinine, Ser 1.82 (H) 0.44 -  1.00 mg/dL   Calcium 9.2 8.9 - 10.3 mg/dL   GFR calc non Af Amer 28 (L) >60 mL/min   GFR calc Af Amer 33 (L) >60 mL/min   Anion gap 9 5 - 15    Comment: Performed at [facility], [place] [address], [place], [place] [address]  Glucose, capillary     Status: None   Collection Time: 10/05/18  6:36 AM  Result Value Ref Range   Glucose-Capillary 84 70 - 99 mg/dL     ROS:  Pertinent items are noted in HPI.  Physical Exam: Vitals:   10/05/18 0545 10/05/18 0826  BP: (!) 154/78 (!) 213/73  Pulse: 78   Resp: 18   Temp: 98.2 F (36.8 C)   SpO2: 97%      General: overweight, sitting comfortably in bed HEENT: MMM Eyes: anicteric Neck: unable to assess JVD Heart: RRR, 2+ radial pulse Lungs:clear Abdomen: soft obese Extremities: no edema Skin: no rashes Neuro: nonfocal  Assessment/Plan: **HTN: resistant HTN now with acute worsening it appears despite consistent medication use.  We discussed suspicion renal artery stenosis contributing she is now willing to proceed with angiogram and possible stenting.  I have consulted and spoke with [facility] and per report plan to do procedure tomorrow.  We appreciate their assistance.  In meantime her hydralazine has been discontinued due to potential allergy.  I've added clonidine 0.1 TID, coreg was also increased to 25 BID (though it looks like that might have been dose already).  Cont other meds.  Depending on side effects from clonidine could titrate, titrate coreg or additional agent.  **CKD IIIb: gradually worsening over time which I expect is related to poorly controlled HTN.  UA with modest protein.  Prehydration in prep for contrast exposure tomorrow.  Discussed risk for CIN with her today.   **DM: well controlled over past few years. Care per primary.   **combined heart failure:  EF 35% on last echo 2015.  Judicious hydration for contrast. Diuretics as needed for euvolemia.    Justin Mend 10/05/2018, 8:30 AM

## 2018-10-06 ENCOUNTER — Encounter (HOSPITAL_COMMUNITY): Payer: Self-pay | Admitting: Vascular Surgery

## 2018-10-06 ENCOUNTER — Encounter (HOSPITAL_COMMUNITY)
Admission: EM | Disposition: A | Discharge status: Home / Self Care | Payer: Self-pay | Source: Home / Self Care | Attending: Internal Medicine

## 2018-10-06 HISTORY — PX: RENAL ANGIOGRAPHY: CATH118260

## 2018-10-06 LAB — CBC
HCT: 27.5 % — ABNORMAL LOW (ref 36.0–46.0)
HEMOGLOBIN: 8.7 g/dL — AB (ref 12.0–15.0)
MCH: 28.2 pg (ref 26.0–34.0)
MCHC: 31.6 g/dL (ref 30.0–36.0)
MCV: 89 fL (ref 80.0–100.0)
Platelets: 227 10*3/uL (ref 150–400)
RBC: 3.09 MIL/uL — ABNORMAL LOW (ref 3.87–5.11)
RDW: 13.1 % (ref 11.5–15.5)
WBC: 5.7 10*3/uL (ref 4.0–10.5)
nRBC: 0 % (ref 0.0–0.2)

## 2018-10-06 LAB — BASIC METABOLIC PANEL
Anion gap: 6 (ref 5–15)
BUN: 37 mg/dL — ABNORMAL HIGH (ref 8–23)
CO2: 23 mmol/L (ref 22–32)
Calcium: 8.7 mg/dL — ABNORMAL LOW (ref 8.9–10.3)
Chloride: 110 mmol/L (ref 98–111)
Creatinine, Ser: 2.14 mg/dL — ABNORMAL HIGH (ref 0.44–1.00)
GFR calc Af Amer: 27 mL/min — ABNORMAL LOW (ref >60)
GFR calc non Af Amer: 23 mL/min — ABNORMAL LOW (ref >60)
Glucose, Bld: 116 mg/dL — ABNORMAL HIGH (ref 70–99)
Potassium: 4.3 mmol/L (ref 3.5–5.1)
Sodium: 139 mmol/L (ref 135–145)

## 2018-10-06 LAB — GLUCOSE, CAPILLARY
GLUCOSE-CAPILLARY: 112 mg/dL — AB (ref 70–99)
GLUCOSE-CAPILLARY: 115 mg/dL — AB (ref 70–99)
Glucose-Capillary: 141 mg/dL — ABNORMAL HIGH (ref 70–99)
Glucose-Capillary: 131 mg/dL — ABNORMAL HIGH (ref 70–99)
Glucose-Capillary: 121 mg/dL — ABNORMAL HIGH (ref 70–99)

## 2018-10-06 SURGERY — RENAL ANGIOGRAPHY
Anesthesia: LOCAL

## 2018-10-06 MED ORDER — CLOPIDOGREL BISULFATE 75 MG PO TABS
75.0000 mg | ORAL_TABLET | Freq: Every day | ORAL | Status: DC
  Administered 2018-10-07: 75 mg via ORAL
  Filled 2018-10-06: qty 1

## 2018-10-06 MED ORDER — ENOXAPARIN SODIUM 30 MG/0.3ML ~~LOC~~ SOLN: 30.0000 mg | SUBCUTANEOUS | Status: DC

## 2018-10-06 MED ORDER — ACETAMINOPHEN 325 MG PO TABS: 650.0000 mg | ORAL_TABLET | ORAL | Status: DC | PRN

## 2018-10-06 MED ORDER — CLOPIDOGREL BISULFATE 300 MG PO TABS
ORAL_TABLET | ORAL | Status: AC
  Filled 2018-10-06: qty 1

## 2018-10-06 MED ORDER — HEPARIN SODIUM (PORCINE) 1000 UNIT/ML IJ SOLN
INTRAMUSCULAR | Status: DC | PRN
  Administered 2018-10-06: 9000 [IU] via INTRAVENOUS

## 2018-10-06 MED ORDER — CLOPIDOGREL BISULFATE 75 MG PO TABS: 75.0000 mg | ORAL_TABLET | Freq: Every day | ORAL | Status: DC

## 2018-10-06 MED ORDER — LIDOCAINE HCL (PF) 1 % IJ SOLN
INTRAMUSCULAR | Status: DC | PRN
  Administered 2018-10-06: 15 mL

## 2018-10-06 MED ORDER — LIDOCAINE HCL (PF) 1 % IJ SOLN
INTRAMUSCULAR | Status: AC
  Filled 2018-10-06: qty 30

## 2018-10-06 MED ORDER — CLONIDINE HCL 0.1 MG PO TABS
0.1000 mg | ORAL_TABLET | Freq: Three times a day (TID) | ORAL | Status: DC
  Administered 2018-10-06 – 2018-10-07 (×3): 0.1 mg via ORAL
  Filled 2018-10-06 (×3): qty 1

## 2018-10-06 MED ORDER — CLOPIDOGREL BISULFATE 75 MG PO TABS: 300.0000 mg | ORAL_TABLET | Freq: Once | ORAL | Status: DC

## 2018-10-06 MED ORDER — HEPARIN (PORCINE) IN NACL 1000-0.9 UT/500ML-% IV SOLN
INTRAVENOUS | Status: DC | PRN
  Administered 2018-10-06 (×2): 500 mL

## 2018-10-06 MED ORDER — SODIUM CHLORIDE 0.9 % IV SOLN: 250.0000 mL | INTRAVENOUS | Status: DC | PRN

## 2018-10-06 MED ORDER — ONDANSETRON HCL 4 MG/2ML IJ SOLN: 4.0000 mg | Freq: Four times a day (QID) | INTRAMUSCULAR | Status: DC | PRN

## 2018-10-06 MED ORDER — LABETALOL HCL 5 MG/ML IV SOLN: 10.0000 mg | INTRAVENOUS | Status: DC | PRN

## 2018-10-06 MED ORDER — SODIUM CHLORIDE 0.9 % IV SOLN: INTRAVENOUS | Status: AC

## 2018-10-06 MED ORDER — SODIUM CHLORIDE 0.9% FLUSH: 3.0000 mL | INTRAVENOUS | Status: DC | PRN

## 2018-10-06 MED ORDER — HEPARIN (PORCINE) IN NACL 1000-0.9 UT/500ML-% IV SOLN
INTRAVENOUS | Status: AC
  Filled 2018-10-06: qty 1000

## 2018-10-06 MED ORDER — HYDRALAZINE HCL 20 MG/ML IJ SOLN
INTRAMUSCULAR | Status: DC | PRN
  Administered 2018-10-06: 10 mg via INTRAVENOUS

## 2018-10-06 MED ORDER — CLOPIDOGREL BISULFATE 300 MG PO TABS
ORAL_TABLET | ORAL | Status: DC | PRN
  Administered 2018-10-06: 300 mg via ORAL

## 2018-10-06 MED ORDER — HYDRALAZINE HCL 20 MG/ML IJ SOLN
INTRAMUSCULAR | Status: AC
  Filled 2018-10-06: qty 1

## 2018-10-06 MED ORDER — SODIUM CHLORIDE 0.9% FLUSH: 3.0000 mL | Freq: Two times a day (BID) | INTRAVENOUS | Status: DC

## 2018-10-06 SURGICAL SUPPLY — 17 items
BALLN VIATRAC 6X15X135 (BALLOONS) ×2
BALLOON VIATRAC 6X15X135 (BALLOONS) ×1 IMPLANT
CATH OMNI FLUSH 5F 65CM (CATHETERS) ×2 IMPLANT
CLOSURE MYNX CONTROL 6F/7F (Vascular Products) ×2 IMPLANT
GUIDE CATH VISTA JR4 6F (CATHETERS) ×2 IMPLANT
KIT ENCORE 26 ADVANTAGE (KITS) ×2 IMPLANT
KIT MICROPUNCTURE NIT STIFF (SHEATH) ×2 IMPLANT
KIT PV (KITS) ×2 IMPLANT
SHEATH PINNACLE 6F 10CM (SHEATH) ×2 IMPLANT
SHEATH PROBE COVER 6X72 (BAG) ×2 IMPLANT
STENT HERCULINK RX 5.0X12X135 (Permanent Stent) ×2 IMPLANT
SYR MEDRAD MARK 7 150ML (SYRINGE) ×2 IMPLANT
TRANSDUCER W/STOPCOCK (MISCELLANEOUS) ×2 IMPLANT
TRAY PV CATH (CUSTOM PROCEDURE TRAY) ×2 IMPLANT
WIRE BENTSON .035X145CM (WIRE) ×2 IMPLANT
WIRE DOC EXTENSION .014X145CM (WIRE) ×2 IMPLANT
WIRE STABILIZER XS .014X180CM (WIRE) ×2 IMPLANT

## 2018-10-06 NOTE — Progress Notes (Signed)
[facility]  Subjective  - Concern for left renal artery stenosis.   Objective (!) 127/48 66 98.2 F (36.8 C) (Oral) 20 100%  Intake/Output Summary (Last 24 hours) at 10/06/2018 0708 Last data filed at 10/06/2018 0300 Gross per 24 hour  Intake 2092.15 ml  Output -  Net 2092.15 ml    2+ femoral pulse  Laboratory Lab Results: Recent Labs    10/04/18 1352 10/06/18 0303  WBC 6.5 5.7  HGB 10.5* 8.7*  HCT 32.7* 27.5*  PLT 258 227   BMET Recent Labs    10/05/18 0529 10/06/18 0303  NA 141 139  K 4.1 4.3  CL 110 110  CO2 22 23  GLUCOSE 99 116*  BUN 32* 37*  CREATININE 1.82* 2.14*  CALCIUM 9.2 8.7*    COAG Lab Results  Component Value Date   INR 1.00 09/25/2013   INR 0.89 09/07/2013   No results found for: PTT  Assessment/Planning:  Plan for aortogram and evaluate left renal artery with concern for high grade stenosis and possible intervention.  IV hydration overnight.  Cr increased to 2.14 today compared to 1.82 but will proceed given renal stenosis could be etiology for worsening renal function.  Marty Heck 10/06/2018 7:08 AM --

## 2018-10-06 NOTE — Progress Notes (Signed)
[facility] Progress Note   Subjective:   Just returned from renal artery arteriogram - 90% stenosis stented.  She feels well.  BP last PM 127/48, this AM 218/92 > 150/71.  Objective Vitals:   10/06/18 0846 10/06/18 0846 10/06/18 0851 10/06/18 0954  BP:  (!) 168/74 (!) 150/71 (!) 151/63  Pulse: 72 72 82 70  Resp: 10 16 (!) 9 18  Temp:    97.7 F (36.5 C)  TempSrc:    Oral  SpO2: 99% 99% 100% 100%  Weight:      Height:       Physical Exam General: lying flat in bed comfortably Heart: RRR Lungs: clear, normal WOB Abdomen: soft, obese Extremities: no edema  Additional Objective Labs: Basic Metabolic Panel: Recent Labs  Lab 10/04/18 1352 10/05/18 0529 10/06/18 0303  NA 142 141 139  K 4.6 4.1 4.3  CL 107 110 110  CO2 23 22 23   GLUCOSE 102* 99 116*  BUN 39* 32* 37*  CREATININE 2.13* 1.82* 2.14*  CALCIUM 9.5 9.2 8.7*   Liver Function Tests: No results for input(s): AST, ALT, ALKPHOS, BILITOT, PROT, ALBUMIN in the last 168 hours. No results for input(s): LIPASE, AMYLASE in the last 168 hours. CBC: Recent Labs  Lab 10/04/18 1352 10/06/18 0303  WBC 6.5 5.7  HGB 10.5* 8.7*  HCT 32.7* 27.5*  MCV 91.1 89.0  PLT 258 227   Blood Culture    Component Value Date/Time   SDES URINE, CLEAN CATCH 09/08/2013 0947   SPECREQUEST NONE 09/08/2013 0947   CULT  09/07/2013 1952    NO GROWTH 5 DAYS Performed at [facility] 09/09/2013 FINAL 09/08/2013 0947    Cardiac Enzymes: No results for input(s): CKTOTAL, CKMB, CKMBINDEX, TROPONINI in the last 168 hours. CBG: Recent Labs  Lab 10/05/18 1122 10/05/18 1652 10/05/18 2110 10/06/18 0640 10/06/18 0950  GLUCAP 138* 78 128* 112* 141*   Iron Studies: No results for input(s): IRON, TIBC, TRANSFERRIN, FERRITIN in the last 72 hours. @lablastinr3 @ Studies/Results: Ct Head Wo Contrast  Result Date: 10/04/2018 CLINICAL DATA:  Hypertension, worst headache of life EXAM: CT HEAD WITHOUT  CONTRAST TECHNIQUE: Contiguous axial images were obtained from the base of the skull through the vertex without intravenous contrast. COMPARISON:  None. FINDINGS: Brain: No evidence of acute infarction, hemorrhage, hydrocephalus, extra-axial collection or mass lesion/mass effect. Vascular: No hyperdense vessel or unexpected calcification. Skull: Normal. Negative for fracture or focal lesion. Sinuses/Orbits: No acute finding. Other: None. IMPRESSION: No acute intracranial pathology. No non-contrast CT findings to explain headache. Electronically Signed   By: Eddie Candle M.D.   On: 10/04/2018 17:03   Vas US Carotid  Result Date: 10/05/2018 Carotid Arterial Duplex Study Indications: Bruit. Performing Technologist: June Leap RDMS, RVT  Examination Guidelines: A complete evaluation includes B-mode imaging, spectral Doppler, color Doppler, and power Doppler as needed of all accessible portions of each vessel. Bilateral testing is considered an integral part of a complete examination. Limited examinations for reoccurring indications may be performed as noted.  Right Carotid Findings: +----------+--------+--------+--------+------------+--------+           PSV cm/sEDV cm/sStenosisDescribe    Comments +----------+--------+--------+--------+------------+--------+ CCA Prox  72      7               heterogenous         +----------+--------+--------+--------+------------+--------+ CCA Distal91      16                                   +----------+--------+--------+--------+------------+--------+  ICA Prox  140     27      1-39%   heterogenous         +----------+--------+--------+--------+------------+--------+ ICA Distal61      15                                   +----------+--------+--------+--------+------------+--------+ ECA       73      1                                    +----------+--------+--------+--------+------------+--------+  +----------+--------+-------+----------------+-------------------+           PSV cm/sEDV cmsDescribe        Arm Pressure (mmHG) +----------+--------+-------+----------------+-------------------+ [id number]            Multiphasic, WNL                    +----------+--------+-------+----------------+-------------------+ +---------+--------+--+--------+--+---------+ VertebralPSV cm/s92EDV cm/s23Antegrade +---------+--------+--+--------+--+---------+  Left Carotid Findings: +----------+-------+-------+--------+---------------------------------+--------+           PSV    EDV    StenosisDescribe                         Comments           cm/s   cm/s                                                     +----------+-------+-------+--------+---------------------------------+--------+ CCA Prox  86     17                                                       +----------+-------+-------+--------+---------------------------------+--------+ CCA Distal116    20                                                       +----------+-------+-------+--------+---------------------------------+--------+ ICA Prox  136    38     40-59%  heterogenous, calcific and                                                irregular                                 +----------+-------+-------+--------+---------------------------------+--------+ ICA Distal59     15                                                       +----------+-------+-------+--------+---------------------------------+--------+ ECA       90     9                                                        +----------+-------+-------+--------+---------------------------------+--------+ +----------+--------+--------+----------------+-------------------+  SubclavianPSV cm/sEDV cm/sDescribe        Arm Pressure (mmHG) +----------+--------+--------+----------------+-------------------+           136              Multiphasic, WNL                    +----------+--------+--------+----------------+-------------------+ +---------+--------+--+--------+--+---------+ VertebralPSV cm/s62EDV cm/s13Antegrade +---------+--------+--+--------+--+---------+ Incidental finding: Right thyroid multi nodular and enlarged at 4.05 x 2.69 cm.  Summary: Right Carotid: Velocities in the right ICA are consistent with a 1-39% stenosis. Left Carotid: Velocities in the left ICA are consistent with a 40-59% stenosis.  *See table(s) above for measurements and observations.  Electronically signed by Curt Jews MD on 10/05/2018 at 3:07:19 PM.    Final    Medications: . sodium chloride 50 mL/hr at 10/05/18 2156   . [MAR Hold] amLODipine  10 mg Oral Daily  . [MAR Hold] atorvastatin  40 mg Oral QPM  . [MAR Hold] carvedilol  25 mg Oral BID WC  . [MAR Hold] cholecalciferol  2,000 Units Oral Q breakfast  . [MAR Hold] diphenhydrAMINE  25 mg Oral Once  . [MAR Hold] enoxaparin (LOVENOX) injection  40 mg Subcutaneous Q24H  . [MAR Hold] indapamide  2.5 mg Oral Daily  . [MAR Hold] insulin aspart  0-15 Units Subcutaneous TID WC  . [MAR Hold] lisinopril  40 mg Oral Daily  . [MAR Hold] sodium chloride flush  3 mL Intravenous Q12H    Assessment/Plan: **HTN: resistant HTN on 5 meds with acute worsening of BP into the 200s in the past week.  S/p renal artery stenting this AM.  Will follow. I told her I expect a reasonable positive outcome is that her BP is now controlled on her current medication regimen.  **CKD IIIb: gradually worsening over time which I expect is related to poorly controlled HTN.  A bit worse at 2.1 this AM.  She's been prehydrated for contrast exposure to mitigate risk for CIN.  Follow UOP and AM BMP.  **DM: well controlled over past few years. Care per primary.   **combined heart failure:  EF 35% on last echo 2015.  Judicious hydration for contrast. Diuretics as needed for euvolemia - currently not need for  additional.   Jannifer Hick MD 10/06/2018, 10:12 AM  [facility] Pager: [phone]

## 2018-10-06 NOTE — Op Note (Signed)
Patient name: Teresa Poole MRN: [id number] DOB: 1951/05/29 Sex: female  10/06/2018 Pre-operative Diagnosis: High-grade left renal artery stenosis in the setting of uncontrolled hypertension Post-operative diagnosis:  Same Surgeon:  Marty Heck, MD Procedure Performed: 1.  Ultrasound guided access of the right common femoral artery 2.  Aortogram with bilateral renal artery arteriogram 3.  Left renal artery stent placement (5 mm x 12 mm balloon expandable Herculink stent) 4.  Post dilation of left renal artery stent at the ostium (6 mm x 15 mm Viatrac) 5.  Mynx closure of the right common femoral artery  Indications: Patient is a 68 year old female previously seen in consultation by Dr. Donnetta Hutching for a suspected high-grade left renal artery stenosis in the setting of uncontrolled hypertension while being optimally medically managed on multiple blood pressure medicines.  She previously was scheduled for left renal artery intervention but she canceled the case.  She was then admitted to the hospital for hypertensive urgency and vascular surgery was reconsulted.  She presents today for renal arteriogram and possible intervention for high-grade left renal artery stenosis after risks benefits were discussed.  Findings: Greater than 90% left renal artery stenosis in the proximal portion of the artery just past the ostium.  Approximate 40 to 50% stenosis of the right renal artery that is not considered hemodynamically significant.  After deployment of a 5 mm x 12 mm balloon expandable Herculink stent in the left renal artery across the stenosis this was postdilated at orifice of the stent to flair it in the aorta with a 6 mm balloon.  There was no evidence of residual stenosis with preserved flow through a patent left renal artery stent and the remainder of the left renal artery and branches remained widely patent.     Procedure:  The patient was identified in the holding area and taken to room  8.  The patient was then placed supine on the table and prepped and draped in the usual sterile fashion.  A time out was called.  Ultrasound was used to evaluate the right common femoral artery.  It was patent .  A digital ultrasound image was acquired.  A micropuncture needle was used to access the right common femoral artery under ultrasound guidance.  An 018 wire was advanced without resistance and a micropuncture sheath was placed.  The 018 wire was removed and a benson wire was placed.  The micropuncture sheath was exchanged for a 6 french sheath.  An omniflush catheter was advanced over the wire to the level of L-1.  An abdominal angiogram was obtained to evaluate bilateral renal arteries.  Patient had single bilateral renal arteries.  The right renal artery appeared to have approximate 40 to 50% stenosis proximally that is not considered hemodynamically significant.  On the left which was the artery of interest the patient had a greater than 90% stenosis just past the ostium.  At that point in time placed a JR4 6 [place] guide cath and used a 014 stabilizer wire.  Using a no touch technique the catheter was then drug down the wall of the aorta until the catheter cannulated the ostium of the left renal artery.  At that time I then advanced my stabilizer wire out into the left renal artery past the stenosis.  Then obtained some injections through the Pace catheter using a Tooey syringe to confirm the stenosis and that I was in the true lumen of the artery.  The left renal artery measured about 5 mm  in normal diameter distal to the stenosis and we selected a 5 mm stent.  Given that we wanted to hang the stent about 1 to 2 mm into the aorta we selected a 5 mm x 12 mm balloon expandable Herculink stent that was then placed through the JR4 guide catheter into the left renal artery over the 014 stabilizer wire.  We obtain one final injection to confirm that we had a good landing zone.  The balloon was then dilated to  indicated pressure and was deployed in the proximal left renal artery.  Another injection through the guide cath showed residual waist of about 30%.  At that point in time we selected a 6 mm balloon placed through the guide catheter with a 014 stabilizer wire into the main left renal artery stent and this was deployed proximally to flare the stent as well as to treat this residual stenosis.  At completion there was no evidence of residual stenosis in the left renal artery appears widely patent.  At that point in time wires and catheters were removed.  Miyx closure device was deployed in the right common femoral artery and additional pressure was held.  She tolerated the procedure without any complications.  Condition: Stable  Contrast: 40 mL   Marty Heck, MD [facility] Office: [phone] Pager: [place]

## 2018-10-06 NOTE — Progress Notes (Signed)
PROGRESS NOTE    Teresa Poole  HBZ:[id number] DOB: 03/30/1951 DOA: 10/04/2018 PCP: No primary care provider on file.     Brief Narrative:  Teresa Poole is an 68 y.o. female with medical history significant ofchronic kidney disease stage II, diabetes mellitus without complication, hyperlipidemia, hypertension, nonischemic cardiomyopathy with an EF of 30 to 35%, obesity, and coronary artery disease which was nonobstructive who has had a lifetime of difficulty managing her blood pressure.   She presented with hypertensive urgency.  She does have recently diagnosed left renal artery stenosis.  Vascular surgery was consulted for stent placement.  New events last 24 hours / Subjective: Patient underwent stent placement of left renal artery with Dr. Carlis Abbott this morning.  Currently feeling well, some soreness in the right groin.  Assessment & Plan:   Principal Problem:   Hypertensive urgency Active Problems:   Diabetes mellitus type II, uncontrolled ([place])   Acute combined systolic and diastolic congestive heart failure (HCC)   CKD (chronic kidney disease), stage III (HCC)   Hypertensive urgency in setting of left renal artery stenosis -Status post stent placement of left renal artery stenosis by Dr. Carlis Abbott on 2/13  -Continue blood pressure medications Norvasc, Coreg, Catapres, Lozol, lisinopril -BP post-op 151/63. Continue to monitor closely   Type 2 diabetes -Continue sliding scale insulin  Chronic combined systolic and diastolic CHF -Without acute exacerbation  Chronic kidney disease stage III-IV  -Appreciate nephrology  Hyperlipidemia -Continue Lipitor  Obesity Body mass index is 29.41 kg/m.    DVT prophylaxis: Lovenox Code Status: Full code Family Communication: At bedside Disposition Plan: Pending further titration and monitoring of blood pressure, creatinine   Consultants:   Nephrology  Vascular surgery  Procedures:   Stent of left renal artery  stenosis by Dr. Carlis Abbott on 2/13  Antimicrobials:  Anti-infectives (From admission, onward)   None        Objective: Vitals:   10/06/18 0846 10/06/18 0846 10/06/18 0851 10/06/18 0954  BP:  (!) 168/74 (!) 150/71 (!) 151/63  Pulse: 72 72 82 70  Resp: 10 16 (!) 9 18  Temp:    97.7 F (36.5 C)  TempSrc:    Oral  SpO2: 99% 99% 100% 100%  Weight:      Height:        Intake/Output Summary (Last 24 hours) at 10/06/2018 1340 Last data filed at 10/06/2018 0300 Gross per 24 hour  Intake 1063.25 ml  Output -  Net 1063.25 ml   Filed Weights   10/04/18 2001 10/05/18 1751  Weight: 87.9 kg 85.2 kg    Examination:  General exam: Appears calm and comfortable  Respiratory system: Clear to auscultation. Respiratory effort normal. Cardiovascular system: S1 & S2 heard, RRR. No JVD, murmurs, rubs, gallops or clicks. No pedal edema. Gastrointestinal system: Abdomen is nondistended, soft and nontender. No organomegaly or masses felt. Normal bowel sounds heard. Central nervous system: Alert and oriented. No focal neurological deficits. Extremities: Symmetric 5 x 5 power. Skin: No rashes, lesions or ulcers Psychiatry: Judgement and insight appear normal. Mood & affect appropriate.   Data Reviewed: I have personally reviewed following labs and imaging studies  CBC: Recent Labs  Lab 10/04/18 1352 10/06/18 0303  WBC 6.5 5.7  HGB 10.5* 8.7*  HCT 32.7* 27.5*  MCV 91.1 89.0  PLT 258 101   Basic Metabolic Panel: Recent Labs  Lab 10/04/18 1352 10/05/18 0529 10/06/18 0303  NA 142 141 139  K 4.6 4.1 4.3  CL 107 110 110  CO2 23 22 23   GLUCOSE 102* 99 116*  BUN 39* 32* 37*  CREATININE 2.13* 1.82* 2.14*  CALCIUM 9.5 9.2 8.7*   GFR: Estimated Creatinine Clearance: 29 mL/min (A) (by C-G formula based on SCr of 2.14 mg/dL (H)). Liver Function Tests: No results for input(s): AST, ALT, ALKPHOS, BILITOT, PROT, ALBUMIN in the last 168 hours. No results for input(s): LIPASE, AMYLASE in the  last 168 hours. No results for input(s): AMMONIA in the last 168 hours. Coagulation Profile: No results for input(s): INR, PROTIME in the last 168 hours. Cardiac Enzymes: No results for input(s): CKTOTAL, CKMB, CKMBINDEX, TROPONINI in the last 168 hours. BNP (last 3 results) No results for input(s): PROBNP in the last 8760 hours. HbA1C: No results for input(s): HGBA1C in the last 72 hours. CBG: Recent Labs  Lab 10/05/18 1652 10/05/18 2110 10/06/18 0640 10/06/18 0950 10/06/18 1233  GLUCAP 78 128* 112* 141* 131*   Lipid Profile: No results for input(s): CHOL, HDL, LDLCALC, TRIG, CHOLHDL, LDLDIRECT in the last 72 hours. Thyroid Function Tests: No results for input(s): TSH, T4TOTAL, FREET4, T3FREE, THYROIDAB in the last 72 hours. Anemia Panel: No results for input(s): VITAMINB12, FOLATE, FERRITIN, TIBC, IRON, RETICCTPCT in the last 72 hours. Sepsis Labs: No results for input(s): PROCALCITON, LATICACIDVEN in the last 168 hours.  Recent Results (from the past 240 hour(s))  MRSA PCR Screening     Status: None   Collection Time: 10/05/18  8:50 PM  Result Value Ref Range Status   MRSA by PCR NEGATIVE NEGATIVE Final    Comment:        The GeneXpert MRSA Assay (FDA approved for NASAL specimens only), is one component of a comprehensive MRSA colonization surveillance program. It is not intended to diagnose MRSA infection nor to guide or monitor treatment for MRSA infections. Performed at [facility], [place] [address] [place],  [address]        Radiology Studies: Ct Head Wo Contrast  Result Date: 10/04/2018 CLINICAL DATA:  Hypertension, worst headache of life EXAM: CT HEAD WITHOUT CONTRAST TECHNIQUE: Contiguous axial images were obtained from the base of the skull through the vertex without intravenous contrast. COMPARISON:  None. FINDINGS: Brain: No evidence of acute infarction, hemorrhage, hydrocephalus, extra-axial collection or mass lesion/mass effect.  Vascular: No hyperdense vessel or unexpected calcification. Skull: Normal. Negative for fracture or focal lesion. Sinuses/Orbits: No acute finding. Other: None. IMPRESSION: No acute intracranial pathology. No non-contrast CT findings to explain headache. Electronically Signed   By: Eddie Candle M.D.   On: 10/04/2018 17:03   Vas US Carotid  Result Date: 10/05/2018 Carotid Arterial Duplex Study Indications: Bruit. Performing Technologist: June Leap RDMS, RVT  Examination Guidelines: A complete evaluation includes B-mode imaging, spectral Doppler, color Doppler, and power Doppler as needed of all accessible portions of each vessel. Bilateral testing is considered an integral part of a complete examination. Limited examinations for reoccurring indications may be performed as noted.  Right Carotid Findings: +----------+--------+--------+--------+------------+--------+           PSV cm/sEDV cm/sStenosisDescribe    Comments +----------+--------+--------+--------+------------+--------+ CCA Prox  72      7               heterogenous         +----------+--------+--------+--------+------------+--------+ CCA Distal91      16                                   +----------+--------+--------+--------+------------+--------+  ICA Prox  140     27      1-39%   heterogenous         +----------+--------+--------+--------+------------+--------+ ICA Distal61      15                                   +----------+--------+--------+--------+------------+--------+ ECA       73      1                                    +----------+--------+--------+--------+------------+--------+ +----------+--------+-------+----------------+-------------------+           PSV cm/sEDV cmsDescribe        Arm Pressure (mmHG) +----------+--------+-------+----------------+-------------------+ [id number]            Multiphasic, WNL                     +----------+--------+-------+----------------+-------------------+ +---------+--------+--+--------+--+---------+ VertebralPSV cm/s92EDV cm/s23Antegrade +---------+--------+--+--------+--+---------+  Left Carotid Findings: +----------+-------+-------+--------+---------------------------------+--------+           PSV    EDV    StenosisDescribe                         Comments           cm/s   cm/s                                                     +----------+-------+-------+--------+---------------------------------+--------+ CCA Prox  86     17                                                       +----------+-------+-------+--------+---------------------------------+--------+ CCA Distal116    20                                                       +----------+-------+-------+--------+---------------------------------+--------+ ICA Prox  136    38     40-59%  heterogenous, calcific and                                                irregular                                 +----------+-------+-------+--------+---------------------------------+--------+ ICA Distal59     15                                                       +----------+-------+-------+--------+---------------------------------+--------+ ECA       90     9                                                        +----------+-------+-------+--------+---------------------------------+--------+ +----------+--------+--------+----------------+-------------------+  SubclavianPSV cm/sEDV cm/sDescribe        Arm Pressure (mmHG) +----------+--------+--------+----------------+-------------------+           136             Multiphasic, WNL                    +----------+--------+--------+----------------+-------------------+ +---------+--------+--+--------+--+---------+ VertebralPSV cm/s62EDV cm/s13Antegrade +---------+--------+--+--------+--+---------+ Incidental finding: Right  thyroid multi nodular and enlarged at 4.05 x 2.69 cm.  Summary: Right Carotid: Velocities in the right ICA are consistent with a 1-39% stenosis. Left Carotid: Velocities in the left ICA are consistent with a 40-59% stenosis.  *See table(s) above for measurements and observations.  Electronically signed by Curt Jews MD on 10/05/2018 at 3:07:19 PM.    Final       Scheduled Meds: . amLODipine  10 mg Oral Daily  . atorvastatin  40 mg Oral QPM  . carvedilol  25 mg Oral BID WC  . cholecalciferol  2,000 Units Oral Q breakfast  . cloNIDine  0.1 mg Oral TID  . [START ON 10/07/2018] clopidogrel  75 mg Oral Q breakfast  . diphenhydrAMINE  25 mg Oral Once  . enoxaparin (LOVENOX) injection  40 mg Subcutaneous Q24H  . indapamide  2.5 mg Oral Daily  . insulin aspart  0-15 Units Subcutaneous TID WC  . lisinopril  40 mg Oral Daily  . sodium chloride flush  3 mL Intravenous Q12H  . sodium chloride flush  3 mL Intravenous Q12H   Continuous Infusions: . sodium chloride    . sodium chloride       LOS: 1 day    Time spent: 40 minutes   Dessa Phi, DO [facility] [email or web address] 10/06/2018, 1:40 PM

## 2018-10-07 LAB — GLUCOSE, CAPILLARY
Glucose-Capillary: 106 mg/dL — ABNORMAL HIGH (ref 70–99)
Glucose-Capillary: 126 mg/dL — ABNORMAL HIGH (ref 70–99)

## 2018-10-07 LAB — CBC
HCT: 24.6 % — ABNORMAL LOW (ref 36.0–46.0)
Hemoglobin: 7.8 g/dL — ABNORMAL LOW (ref 12.0–15.0)
MCH: 28.3 pg (ref 26.0–34.0)
MCHC: 31.7 g/dL (ref 30.0–36.0)
MCV: 89.1 fL (ref 80.0–100.0)
Platelets: 201 10*3/uL (ref 150–400)
RBC: 2.76 MIL/uL — ABNORMAL LOW (ref 3.87–5.11)
RDW: 13.2 % (ref 11.5–15.5)
WBC: 6.5 10*3/uL (ref 4.0–10.5)
nRBC: 0 % (ref 0.0–0.2)

## 2018-10-07 LAB — BASIC METABOLIC PANEL
Anion gap: 6 (ref 5–15)
BUN: 43 mg/dL — ABNORMAL HIGH (ref 8–23)
CO2: 22 mmol/L (ref 22–32)
Calcium: 8.8 mg/dL — ABNORMAL LOW (ref 8.9–10.3)
Chloride: 112 mmol/L — ABNORMAL HIGH (ref 98–111)
Creatinine, Ser: 2.33 mg/dL — ABNORMAL HIGH (ref 0.44–1.00)
GFR calc Af Amer: 24 mL/min — ABNORMAL LOW (ref >60)
GFR, EST NON AFRICAN AMERICAN: 21 mL/min — AB (ref >60)
Glucose, Bld: 119 mg/dL — ABNORMAL HIGH (ref 70–99)
Potassium: 4.2 mmol/L (ref 3.5–5.1)
Sodium: 140 mmol/L (ref 135–145)

## 2018-10-07 MED ORDER — CLOPIDOGREL BISULFATE 75 MG PO TABS: 75.0000 mg | ORAL_TABLET | Freq: Every day | ORAL | 0 refills | Status: DC

## 2018-10-07 NOTE — Progress Notes (Signed)
Pt refused to allow tech to check her CBG.

## 2018-10-07 NOTE — Progress Notes (Signed)
[facility] Progress Note   Subjective:   No particular complaints this AM, just wants to go home.  Bps improved.     Objective Vitals:   10/07/18 0234 10/07/18 0452 10/07/18 0735 10/07/18 1632  BP: 131/60 (!) 126/47 (!) 142/58 (!) 155/60  Pulse: 64 64 70 64  Resp:  20 18 16   Temp:  98.4 F (36.9 C) 98.7 F (37.1 C) 97.6 F (36.4 C)  TempSrc:  Oral Oral Oral  SpO2:  100% 100% 100%  Weight:      Height:       Physical Exam General: lying flat in bed comfortably Heart: RRR Lungs: clear, normal WOB Abdomen: soft, obese Extremities: no edema  Additional Objective Labs: Basic Metabolic Panel: Recent Labs  Lab 10/05/18 0529 10/06/18 0303 10/07/18 0336  NA 141 139 140  K 4.1 4.3 4.2  CL 110 110 112*  CO2 22 23 22   GLUCOSE 99 116* 119*  BUN 32* 37* 43*  CREATININE 1.82* 2.14* 2.33*  CALCIUM 9.2 8.7* 8.8*   Liver Function Tests: No results for input(s): AST, ALT, ALKPHOS, BILITOT, PROT, ALBUMIN in the last 168 hours. No results for input(s): LIPASE, AMYLASE in the last 168 hours. CBC: Recent Labs  Lab 10/04/18 1352 10/06/18 0303 10/07/18 0336  WBC 6.5 5.7 6.5  HGB 10.5* 8.7* 7.8*  HCT 32.7* 27.5* 24.6*  MCV 91.1 89.0 89.1  PLT 258 227 201   Blood Culture    Component Value Date/Time   SDES URINE, CLEAN CATCH 09/08/2013 0947   SPECREQUEST NONE 09/08/2013 0947   CULT  09/07/2013 1952    NO GROWTH 5 DAYS Performed at [facility] 09/09/2013 FINAL 09/08/2013 0947    Cardiac Enzymes: No results for input(s): CKTOTAL, CKMB, CKMBINDEX, TROPONINI in the last 168 hours. CBG: Recent Labs  Lab 10/06/18 1233 10/06/18 1640 10/06/18 2101 10/07/18 0733 10/07/18 1140  GLUCAP 131* 115* 121* 106* 126*   Iron Studies: No results for input(s): IRON, TIBC, TRANSFERRIN, FERRITIN in the last 72 hours. @lablastinr3 @ Studies/Results: No results found. Medications: . sodium chloride     . amLODipine  10 mg Oral Daily  .  atorvastatin  40 mg Oral QPM  . carvedilol  25 mg Oral BID WC  . cholecalciferol  2,000 Units Oral Q breakfast  . clopidogrel  75 mg Oral Q breakfast  . enoxaparin (LOVENOX) injection  30 mg Subcutaneous Q24H  . indapamide  2.5 mg Oral Daily  . insulin aspart  0-15 Units Subcutaneous TID WC  . sodium chloride flush  3 mL Intravenous Q12H  . sodium chloride flush  3 mL Intravenous Q12H    Assessment/Plan: **HTN: resistant HTN on 5 meds with acute worsening of BP into the 200s in the past week.  S/p renal artery stenting yesterday AM. BPs seem better since then - none into 200s.  I again told her I expect a reasonable positive outcome is that her BP is now controlled on her current medication regimen.  **CKD IIIb: gradually worsening over time which I expect is related to poorly controlled HTN.  A bit worse at 2.3 this AM.  She was peri-hydrated for contrast exposure to mitigate risk for CIN.  UOP isn't being followed but she says it's normal volume.  If she is discharged she can have a BMP checked next Tuesday at [facility].    **DM: well controlled over past few years. Care per primary.   **combined heart failure:  EF 35% on  last echo 2015.  Diuretics as needed for euvolemia - currently not need for additional.   Jannifer Hick MD 10/07/2018, 5:38 PM  [facility] Pager: [phone]

## 2018-10-07 NOTE — Progress Notes (Signed)
Patient ID: Teresa Poole, female   DOB: 14-Dec-1950, 68 y.o.   MRN: [id number] Stable overall.  Right groin puncture without hematoma.  Has multiple questions regarding her medications which will be addressed by the primary service.  We will see her in the office for duplex follow-up

## 2018-10-07 NOTE — Discharge Summary (Signed)
Physician Discharge Summary  Teresa Poole KNL:[id number] DOB: 29-Oct-1950 DOA: 10/04/2018  PCP: No primary care provider on file.  Admit date: 10/04/2018 Discharge date: 10/08/2018  Admitted From: Home Disposition:  Home   Recommendations for Outpatient Follow-up:  1. Follow up with PCP in 1 week 2. Follow up with Vascular Surgery 3. Follow up with Nephrology  4. Please obtain BMP in 1 week   Discharge Condition: Stable CODE STATUS: Full Diet recommendation: Heart healthy/carb modified  Brief/Interim Summary: Teresa P Petersonis an 68 y.o.femalewith medical history significant ofchronic kidney disease stage II, diabetes mellitus without complication, hyperlipidemia, hypertension, nonischemic cardiomyopathy with an EF of 30 to 35%, obesity, and coronary artery disease which was nonobstructive who has had a lifetime of difficulty managing her blood pressure.  She presented with hypertensive urgency.  She does have recently diagnosed left renal artery stenosis.  Vascular surgery was consulted for stent placement. Patient underwent stent placement of left renal artery with Dr. Carlis Abbott 2/13.   Discharge Diagnoses:   Hypertensive urgency in setting of left renal artery stenosis -Status post stent placement of left renal artery stenosis by Dr. Carlis Abbott on 2/13  -Continue blood pressure medications Norvasc, Coreg, Lozol, lisinopril. Catapres stopped after discussing with Nephro   Type 2 diabetes -Continue sliding scale insulin  Chronic combined systolic and diastolic CHF -Without acute exacerbation  Chronic kidney disease stage III-IV  -Appreciate nephrology. Cr bumped up slightly after stent placement. Discussed with Nephro, repeat BMP as outpatient, okay to resume lisinopril   Hyperlipidemia -Continue Lipitor  Obesity -Body mass index is 29.41 kg/m.  Discharge Instructions  Discharge Instructions    Call MD for:  difficulty breathing, headache or visual disturbances    Complete by:  As directed    Call MD for:  extreme fatigue   Complete by:  As directed    Call MD for:  persistant dizziness or light-headedness   Complete by:  As directed    Call MD for:  persistant nausea and vomiting   Complete by:  As directed    Call MD for:  severe uncontrolled pain   Complete by:  As directed    Call MD for:  temperature >100.4   Complete by:  As directed    Diet - low sodium heart healthy   Complete by:  As directed    Discharge instructions   Complete by:  As directed    You were cared for by a hospitalist during your hospital stay. If you have any questions about your discharge medications or the care you received while you were in the hospital after you are discharged, you can call the unit and ask to speak with the hospitalist on call if the hospitalist that took care of you is not available. Once you are discharged, your primary care physician will handle any further medical issues. Please note that NO REFILLS for any discharge medications will be authorized once you are discharged, as it is imperative that you return to your primary care physician (or establish a relationship with a primary care physician if you do not have one) for your aftercare needs so that they can reassess your need for medications and monitor your lab values.   Increase activity slowly   Complete by:  As directed      Allergies as of 10/07/2018      Reactions   Hydralazine Hcl Itching, Other (See Comments)   ENTIRE BODY = burning sensation, also in the eyes (they have become very sensitive  to light)      Medication List    STOP taking these medications   aspirin 81 MG EC tablet     TAKE these medications   amLODipine 10 MG tablet Commonly known as:  NORVASC TAKE 1 TABLET(10 MG) BY MOUTH DAILY What changed:  See the new instructions.   atorvastatin 40 MG tablet Commonly known as:  LIPITOR TAKE 1 TABLET BY MOUTH DAILY What changed:    how much to take  how to take  this  when to take this  additional instructions   carvedilol 25 MG tablet Commonly known as:  COREG Take 25 mg by mouth 2 (two) times daily with a meal.   CINNAMON PO Take 1 capsule by mouth daily with breakfast.   clopidogrel 75 MG tablet Commonly known as:  PLAVIX Take 1 tablet (75 mg total) by mouth daily with breakfast.   FISH OIL PO Take 1 capsule by mouth daily with breakfast.   glucose blood test strip Commonly known as:  ONE TOUCH ULTRA TEST Use to check blood sugar 3 times daily Dx code E11.65 What changed:  Another medication with the same name was changed. Make sure you understand how and when to take each.   ONETOUCH VERIO test strip Generic drug:  glucose blood CHECK BLOOD SUGAR THREE TIMES DAILY What changed:  See the new instructions.   indapamide 2.5 MG tablet Commonly known as:  LOZOL   lisinopril 40 MG tablet Commonly known as:  PRINIVIL,ZESTRIL TAKE 1 TABLET(40 MG) BY MOUTH DAILY What changed:  See the new instructions.   MULTIVITAMIN GUMMIES ADULT Chew Chew 1 tablet by mouth daily with breakfast.   TRULICITY 5.85 ID/7.8EU Sopn Generic drug:  Dulaglutide INJECT 1 PEN INTO THE ABDOMINAL SKIN AS DIRECTED EVERY WEEK What changed:  See the new instructions.   TURMERIC PO Take 1 capsule by mouth daily with breakfast.   Vitamin D3 25 MCG (1000 UT) Caps Take 2,000 Units by mouth daily with breakfast.   VITAMIN E PO Take 1 capsule by mouth daily with breakfast.      Follow-up Information    Marty Heck, MD. Schedule an appointment as soon as possible for a visit in 1 week(s).   Specialty:  Vascular Surgery Contact information: [address] [place] [address] [phone]        Darreld Mclean, MD. Schedule an appointment as soon as possible for a visit in 1 week(s).   Specialty:  Family Medicine Contact information: [place] [address] [place] [address] [phone]        Rexene Agent, MD.  Schedule an appointment as soon as possible for a visit in 1 week(s).   Specialty:  Nephrology Contact information: [place] [address] [phone]          Allergies  Allergen Reactions  . Hydralazine Hcl Itching and Other (See Comments)    ENTIRE BODY = burning sensation, also in the eyes (they have become very sensitive to light)    Consultations:  Nephrology  Vascular surgery    Procedures/Studies: Ct Head Wo Contrast  Result Date: 10/04/2018 CLINICAL DATA:  Hypertension, worst headache of life EXAM: CT HEAD WITHOUT CONTRAST TECHNIQUE: Contiguous axial images were obtained from the base of the skull through the vertex without intravenous contrast. COMPARISON:  None. FINDINGS: Brain: No evidence of acute infarction, hemorrhage, hydrocephalus, extra-axial collection or mass lesion/mass effect. Vascular: No hyperdense vessel or unexpected calcification. Skull: Normal. Negative  for fracture or focal lesion. Sinuses/Orbits: No acute finding. Other: None. IMPRESSION: No acute intracranial pathology. No non-contrast CT findings to explain headache. Electronically Signed   By: Eddie Candle M.D.   On: 10/04/2018 17:03   Vas US Carotid  Result Date: 10/05/2018 Carotid Arterial Duplex Study Indications: Bruit. Performing Technologist: June Leap RDMS, RVT  Examination Guidelines: A complete evaluation includes B-mode imaging, spectral Doppler, color Doppler, and power Doppler as needed of all accessible portions of each vessel. Bilateral testing is considered an integral part of a complete examination. Limited examinations for reoccurring indications may be performed as noted.  Right Carotid Findings: +----------+--------+--------+--------+------------+--------+           PSV cm/sEDV cm/sStenosisDescribe    Comments +----------+--------+--------+--------+------------+--------+ CCA Prox  72      7               heterogenous          +----------+--------+--------+--------+------------+--------+ CCA Distal91      16                                   +----------+--------+--------+--------+------------+--------+ ICA Prox  140     27      1-39%   heterogenous         +----------+--------+--------+--------+------------+--------+ ICA Distal61      15                                   +----------+--------+--------+--------+------------+--------+ ECA       73      1                                    +----------+--------+--------+--------+------------+--------+ +----------+--------+-------+----------------+-------------------+           PSV cm/sEDV cmsDescribe        Arm Pressure (mmHG) +----------+--------+-------+----------------+-------------------+ [id number]            Multiphasic, WNL                    +----------+--------+-------+----------------+-------------------+ +---------+--------+--+--------+--+---------+ VertebralPSV cm/s92EDV cm/s23Antegrade +---------+--------+--+--------+--+---------+  Left Carotid Findings: +----------+-------+-------+--------+---------------------------------+--------+           PSV    EDV    StenosisDescribe                         Comments           cm/s   cm/s                                                     +----------+-------+-------+--------+---------------------------------+--------+ CCA Prox  86     17                                                       +----------+-------+-------+--------+---------------------------------+--------+ CCA Distal116    20                                                       +----------+-------+-------+--------+---------------------------------+--------+  ICA Prox  136    38     40-59%  heterogenous, calcific and                                                irregular                                 +----------+-------+-------+--------+---------------------------------+--------+  ICA Distal59     15                                                       +----------+-------+-------+--------+---------------------------------+--------+ ECA       90     9                                                        +----------+-------+-------+--------+---------------------------------+--------+ +----------+--------+--------+----------------+-------------------+ SubclavianPSV cm/sEDV cm/sDescribe        Arm Pressure (mmHG) +----------+--------+--------+----------------+-------------------+           136             Multiphasic, WNL                    +----------+--------+--------+----------------+-------------------+ +---------+--------+--+--------+--+---------+ VertebralPSV cm/s62EDV cm/s13Antegrade +---------+--------+--+--------+--+---------+ Incidental finding: Right thyroid multi nodular and enlarged at 4.05 x 2.69 cm.  Summary: Right Carotid: Velocities in the right ICA are consistent with a 1-39% stenosis. Left Carotid: Velocities in the left ICA are consistent with a 40-59% stenosis.  *See table(s) above for measurements and observations.  Electronically signed by Curt Jews MD on 10/05/2018 at 3:07:19 PM.    Final        Discharge Exam: Vitals:   10/07/18 0735 10/07/18 1632  BP: (!) 142/58 (!) 155/60  Pulse: 70 64  Resp: 18 16  Temp: 98.7 F (37.1 C) 97.6 F (36.4 C)  SpO2: 100% 100%    General: Pt is alert, awake, not in acute distress Cardiovascular: RRR, S1/S2 +, no rubs, no gallops Respiratory: CTA bilaterally, no wheezing, no rhonchi Abdominal: Soft, NT, ND, bowel sounds + Extremities: no edema, no cyanosis    The results of significant diagnostics from this hospitalization (including imaging, microbiology, ancillary and laboratory) are listed below for reference.     Microbiology: Recent Results (from the past 240 hour(s))  MRSA PCR Screening     Status: None   Collection Time: 10/05/18  8:50 PM  Result Value Ref Range Status    MRSA by PCR NEGATIVE NEGATIVE Final    Comment:        The GeneXpert MRSA Assay (FDA approved for NASAL specimens only), is one component of a comprehensive MRSA colonization surveillance program. It is not intended to diagnose MRSA infection nor to guide or monitor treatment for MRSA infections. Performed at [facility], [place] [address] [place], [place] [address]      Labs: BNP (last 3 results) No results for input(s): BNP in the last 8760 hours. Basic Metabolic Panel: Recent Labs  Lab 10/04/18 1352  10/05/18 0529 10/06/18 0303 10/07/18 0336  NA 142 141 139 140  K 4.6 4.1 4.3 4.2  CL 107 110 110 112*  CO2 23 22 23 22   GLUCOSE 102* 99 116* 119*  BUN 39* 32* 37* 43*  CREATININE 2.13* 1.82* 2.14* 2.33*  CALCIUM 9.5 9.2 8.7* 8.8*   Liver Function Tests: No results for input(s): AST, ALT, ALKPHOS, BILITOT, PROT, ALBUMIN in the last 168 hours. No results for input(s): LIPASE, AMYLASE in the last 168 hours. No results for input(s): AMMONIA in the last 168 hours. CBC: Recent Labs  Lab 10/04/18 1352 10/06/18 0303 10/07/18 0336  WBC 6.5 5.7 6.5  HGB 10.5* 8.7* 7.8*  HCT 32.7* 27.5* 24.6*  MCV 91.1 89.0 89.1  PLT 258 227 201   Cardiac Enzymes: No results for input(s): CKTOTAL, CKMB, CKMBINDEX, TROPONINI in the last 168 hours. BNP: Invalid input(s): POCBNP CBG: Recent Labs  Lab 10/06/18 1233 10/06/18 1640 10/06/18 2101 10/07/18 0733 10/07/18 1140  GLUCAP 131* 115* 121* 106* 126*   D-Dimer No results for input(s): DDIMER in the last 72 hours. Hgb A1c No results for input(s): HGBA1C in the last 72 hours. Lipid Profile No results for input(s): CHOL, HDL, LDLCALC, TRIG, CHOLHDL, LDLDIRECT in the last 72 hours. Thyroid function studies No results for input(s): TSH, T4TOTAL, T3FREE, THYROIDAB in the last 72 hours.  Invalid input(s): FREET3 Anemia work up No results for input(s): VITAMINB12, FOLATE, FERRITIN, TIBC, IRON, RETICCTPCT in the last 72  hours. Urinalysis    Component Value Date/Time   COLORURINE STRAW (A) 10/04/2018 1750   APPEARANCEUR CLEAR 10/04/2018 1750   LABSPEC 1.012 10/04/2018 1750   PHURINE 5.0 10/04/2018 1750   GLUCOSEU NEGATIVE 10/04/2018 1750   HGBUR NEGATIVE 10/04/2018 1750   BILIRUBINUR NEGATIVE 10/04/2018 1750   KETONESUR NEGATIVE 10/04/2018 1750   PROTEINUR 100 (A) 10/04/2018 1750   UROBILINOGEN 0.2 09/22/2013 1451   NITRITE NEGATIVE 10/04/2018 1750   LEUKOCYTESUR SMALL (A) 10/04/2018 1750   Sepsis Labs Invalid input(s): PROCALCITONIN,  WBC,  LACTICIDVEN Microbiology Recent Results (from the past 240 hour(s))  MRSA PCR Screening     Status: None   Collection Time: 10/05/18  8:50 PM  Result Value Ref Range Status   MRSA by PCR NEGATIVE NEGATIVE Final    Comment:        The GeneXpert MRSA Assay (FDA approved for NASAL specimens only), is one component of a comprehensive MRSA colonization surveillance program. It is not intended to diagnose MRSA infection nor to guide or monitor treatment for MRSA infections. Performed at [facility], [place] [address] [place], [place] [address]      Patient was seen and examined on the day of discharge and was found to be in stable condition. Time coordinating discharge: 25 minutes including assessment and coordination of care, as well as examination of the patient.   SIGNED:  Dessa Phi, DO [facility] [email or web address] 10/08/2018, 1:30 PM

## 2018-10-08 ENCOUNTER — Other Ambulatory Visit: Payer: Self-pay | Admitting: Family Medicine

## 2018-10-08 DIAGNOSIS — E785 Hyperlipidemia, unspecified: Secondary | ICD-10-CM

## 2018-10-08 DIAGNOSIS — I10 Essential (primary) hypertension: Secondary | ICD-10-CM

## 2018-10-08 DIAGNOSIS — E1169 Type 2 diabetes mellitus with other specified complication: Secondary | ICD-10-CM

## 2018-10-11 ENCOUNTER — Ambulatory Visit (INDEPENDENT_AMBULATORY_CARE_PROVIDER_SITE_OTHER): Payer: BC Managed Care – PPO | Admitting: Vascular Surgery

## 2018-10-11 ENCOUNTER — Encounter: Payer: Self-pay | Admitting: Vascular Surgery

## 2018-10-11 DIAGNOSIS — I701 Atherosclerosis of renal artery: Secondary | ICD-10-CM

## 2018-10-11 HISTORY — DX: Atherosclerosis of renal artery: I70.1

## 2018-10-11 NOTE — Progress Notes (Signed)
Patient name: Teresa Poole MRN: [id number] DOB: 1951/04/01 Sex: female  REASON FOR VISIT: Bruising in right groin and abdominal wall after left renal artery stent HPI: Teresa Poole is a 68 y.o. female with multiple medical problems including hypertension and chronic kidney disease that underwent left renal artery stent last week.  She presents as an add-on today to be evaluated after calling the discharge line with concern for some abdominal wall bruising as well as some bruising on her right inner thigh.  Patient states she is not exactly sure when the bruising started but just noticed it today.  She has been taking Plavix since left renal artery stent was placed for a high-grade greater than 90% stenosis.  She initially did well after the surgery and was discharged from the hospital.  Denies any right groin pain which was site for access.  Past Medical History:  Diagnosis Date  . Allergy   . Anemia   . CAD (coronary artery disease)    non-obstructive by LHC (09/25/2013): Proximal and mid LAD serial 20%, proximal circumflex 30%, mid AV groove circumflex 30%, mid RCA mild plaque.  . Cancer ([place])   . Carotid stenosis    a. Carotid US (09/26/2011): Bilateral 1-39% ICA.  Marland Kitchen Chronic combined systolic and diastolic CHF (congestive heart failure) ([place])   . CKD (chronic kidney disease)   . Diabetes mellitus without complication ([place])   . GERD (gastroesophageal reflux disease)   . History of ovarian cancer   . Hyperlipidemia   . Hypertension   . NICM (nonischemic cardiomyopathy) ([place])    a. Echocardiogram (09/08/2013): Mild LVH, EF 30-35%, no WMA, mild LAE.  Marland Kitchen Obesity (BMI 30-39.9)   . Thyroid disease    Seen by specialist    Past Surgical History:  Procedure Laterality Date  . LEFT HEART CATHETERIZATION WITH CORONARY ANGIOGRAM N/A 09/25/2013   Procedure: LEFT HEART CATHETERIZATION WITH CORONARY ANGIOGRAM;  Surgeon: Burnell Blanks, MD;  Location: [facility] CATH LAB;  Service:  Cardiovascular;  Laterality: N/A;  . RENAL ANGIOGRAPHY N/A 10/06/2018   Procedure: RENAL ANGIOGRAPHY;  Surgeon: Marty Heck, MD;  Location: [facility] CV LAB;  Service: Cardiovascular;  Laterality: N/A;    Family History  Problem Relation Age of Onset  . Diabetes Father   . Hypertension Father   . Hypertension Maternal Grandfather     SOCIAL HISTORY: Social History   Tobacco Use  . Smoking status: Never Smoker  . Smokeless tobacco: Never Used  Substance Use Topics  . Alcohol use: No    Allergies  Allergen Reactions  . Hydralazine Hcl Itching and Other (See Comments)    ENTIRE BODY = burning sensation, also in the eyes (they have become very sensitive to light)    Current Outpatient Medications  Medication Sig Dispense Refill  . amLODipine (NORVASC) 10 MG tablet Take 1 tablet (10 mg total) by mouth daily. 30 tablet 0  . atorvastatin (LIPITOR) 40 MG tablet Take 1 tablet (40 mg total) by mouth every evening. 30 tablet 0  . carvedilol (COREG) 25 MG tablet Take 25 mg by mouth 2 (two) times daily with a meal.    . Cholecalciferol (VITAMIN D3) 25 MCG (1000 UT) CAPS Take 2,000 Units by mouth daily with breakfast.    . CINNAMON PO Take 1 capsule by mouth daily with breakfast.    . clopidogrel (PLAVIX) 75 MG tablet Take 1 tablet (75 mg total) by mouth daily with breakfast. 30 tablet 0  . glucose blood (  ONE TOUCH ULTRA TEST) test strip Use to check blood sugar 3 times daily Dx code E11.65 100 each 3  . indapamide (LOZOL) 2.5 MG tablet     . lisinopril (PRINIVIL,ZESTRIL) 40 MG tablet Take 1 tablet (40 mg total) by mouth daily. 30 tablet 0  . Multiple Vitamins-Minerals (MULTIVITAMIN GUMMIES ADULT) CHEW Chew 1 tablet by mouth daily with breakfast.    . Omega-3 Fatty Acids (FISH OIL PO) Take 1 capsule by mouth daily with breakfast.    . ONETOUCH VERIO test strip CHECK BLOOD SUGAR THREE TIMES DAILY (Patient taking differently: 1 each by Other route 3 (three) times daily. ) 191 each  0  . TRULICITY 4.78 GN/5.6OZ SOPN INJECT 1 PEN INTO THE ABDOMINAL SKIN AS DIRECTED EVERY WEEK (Patient taking differently: Inject 0.75 mg into the skin every Sunday. ) 6 mL 0  . TURMERIC PO Take 1 capsule by mouth daily with breakfast.    . VITAMIN E PO Take 1 capsule by mouth daily with breakfast.     No current facility-administered medications for this visit.     REVIEW OF SYSTEMS:  [X]  denotes positive finding, [ ]  denotes negative finding Cardiac  Comments:  Chest pain or chest pressure:    Shortness of breath upon exertion:    Short of breath when lying flat:    Irregular heart rhythm:        Vascular    Pain in calf, thigh, or hip brought on by ambulation:    Pain in feet at night that wakes you up from your sleep:     Blood clot in your veins:    Leg swelling:         Pulmonary    Oxygen at home:    Productive cough:     Wheezing:         Neurologic    Sudden weakness in arms or legs:     Sudden numbness in arms or legs:     Sudden onset of difficulty speaking or slurred speech:    Temporary loss of vision in one eye:     Problems with dizziness:         Gastrointestinal    Blood in stool:     Vomited blood:         Genitourinary    Burning when urinating:     Blood in urine:        Psychiatric    Major depression:         Hematologic    Bleeding problems:    Problems with blood clotting too easily:        Skin    Rashes or ulcers:        Constitutional    Fever or chills:      PHYSICAL EXAM: Vitals:   10/11/18 1011  BP: (!) 159/65  Pulse: 70  Resp: 16  SpO2: 98%  Weight: 187 lb 9.8 oz (85.1 kg)  Height: 5\' 7"  (1.702 m)    GENERAL: The patient is a well-nourished female, in no acute distress. The vital signs are documented above. CARDIAC: There is a regular rate and rhythm.  VASCULAR:  2+ right femoral pulse 2+ right PT pulse Focal bruising right lower abdomnial wall Some bruising along mons and inner thigh - all soft - no appreciable  hematoma PULMONARY: There is good air exchange bilaterally without wheezing or rales. ABDOMEN: Soft and non-tender with normal pitched bowel sounds.  MUSCULOSKELETAL: There are no major deformities or cyanosis.  NEUROLOGIC: No focal weakness or paresthesias are detected.   DATA:   None  Assessment/Plan:  On my exam Teresa Poole has a palpable right femoral pulse as well as a palpable posterior tibial pulse at the right ankle which is the groin that we used for access to place her left renal stent.  Her blood pressure is now significantly improved and she remains on Plavix for stent patency.  The bruising on her abdominal wall is likely related to her Lovenox shots and/or heparin shots in the hospital for DVT prophylaxis and is well away from our access site.  She does have some ecchymosis just on the inner thigh and along the mons that may have been related to access but I do not appreciate any hematoma on exam and all the soft tissue is very soft.  She has no groin pain otherwise that would prompt an ultrasound or further imaging at this time in my opinion.  I will arrange for her to come back in 1 month to see one of our PAs for ongoing surveillance with left renal artery duplex.  I asked that she call our office and let us know if she feels like the groin worsens over time but tried to offer reassurance today.   Marty Heck, MD [facility] Office: [phone] Pager: [phone]

## 2018-11-09 ENCOUNTER — Other Ambulatory Visit: Payer: Self-pay

## 2018-11-09 DIAGNOSIS — I701 Atherosclerosis of renal artery: Secondary | ICD-10-CM

## 2018-11-10 ENCOUNTER — Other Ambulatory Visit: Payer: Self-pay

## 2018-11-10 ENCOUNTER — Ambulatory Visit: Payer: BC Managed Care – PPO | Admitting: Family

## 2018-11-10 ENCOUNTER — Ambulatory Visit (HOSPITAL_COMMUNITY)
Admission: RE | Admit: 2018-11-10 | Discharge: 2018-11-10 | Disposition: A | Discharge status: Home / Self Care | Payer: BC Managed Care – PPO | Source: Ambulatory Visit | Attending: Family | Admitting: Family

## 2018-11-10 DIAGNOSIS — I701 Atherosclerosis of renal artery: Secondary | ICD-10-CM | POA: Diagnosis not present

## 2018-11-11 ENCOUNTER — Ambulatory Visit: Payer: BC Managed Care – PPO | Admitting: Family

## 2018-11-14 ENCOUNTER — Other Ambulatory Visit: Payer: Self-pay | Admitting: Family Medicine

## 2018-11-14 DIAGNOSIS — E1169 Type 2 diabetes mellitus with other specified complication: Secondary | ICD-10-CM

## 2018-11-14 DIAGNOSIS — I10 Essential (primary) hypertension: Secondary | ICD-10-CM

## 2018-11-14 DIAGNOSIS — E785 Hyperlipidemia, unspecified: Secondary | ICD-10-CM

## 2018-11-15 ENCOUNTER — Telehealth: Payer: Self-pay | Admitting: Vascular Surgery

## 2018-11-15 ENCOUNTER — Ambulatory Visit: Payer: BC Managed Care – PPO | Admitting: Vascular Surgery

## 2018-11-15 NOTE — Telephone Encounter (Signed)
Called Teresa Poole to inform her that left renal artery with no identifiable stenosis on renal artery duplex.  Difficult to visualize stent due to body habitus, but velocities significantly improved.  She states BP somewhat better controlled.  Will have her follow-up again in 6 months with renal artery duplex.  Marty Heck, MD [facility] Office: [phone] Pager: [place]

## 2018-11-18 ENCOUNTER — Telehealth: Payer: Self-pay | Admitting: *Deleted

## 2018-11-18 NOTE — Telephone Encounter (Signed)
REFERRAL SENT TO SCHEDULING AND NOTES ON FILE FROM DR. [facility] MASNERI [phone]

## 2018-11-24 ENCOUNTER — Other Ambulatory Visit: Payer: Self-pay | Admitting: Endocrinology

## 2018-11-27 ENCOUNTER — Emergency Department (HOSPITAL_COMMUNITY): Payer: BC Managed Care – PPO

## 2018-11-27 ENCOUNTER — Encounter (HOSPITAL_COMMUNITY): Payer: Self-pay | Admitting: Internal Medicine

## 2018-11-27 ENCOUNTER — Observation Stay (HOSPITAL_COMMUNITY)
Admission: EM | Admit: 2018-11-27 | Discharge: 2018-11-28 | Disposition: A | Discharge status: Home / Self Care | Payer: BC Managed Care – PPO | Attending: Internal Medicine | Admitting: Internal Medicine

## 2018-11-27 ENCOUNTER — Other Ambulatory Visit: Payer: Self-pay

## 2018-11-27 DIAGNOSIS — I130 Hypertensive heart and chronic kidney disease with heart failure and stage 1 through stage 4 chronic kidney disease, or unspecified chronic kidney disease: Secondary | ICD-10-CM | POA: Insufficient documentation

## 2018-11-27 DIAGNOSIS — I2510 Atherosclerotic heart disease of native coronary artery without angina pectoris: Secondary | ICD-10-CM | POA: Insufficient documentation

## 2018-11-27 DIAGNOSIS — N183 Chronic kidney disease, stage 3 unspecified: Secondary | ICD-10-CM | POA: Diagnosis present

## 2018-11-27 DIAGNOSIS — I701 Atherosclerosis of renal artery: Secondary | ICD-10-CM | POA: Diagnosis present

## 2018-11-27 DIAGNOSIS — I208 Other forms of angina pectoris: Secondary | ICD-10-CM | POA: Diagnosis not present

## 2018-11-27 DIAGNOSIS — Z8249 Family history of ischemic heart disease and other diseases of the circulatory system: Secondary | ICD-10-CM | POA: Diagnosis not present

## 2018-11-27 DIAGNOSIS — K219 Gastro-esophageal reflux disease without esophagitis: Secondary | ICD-10-CM | POA: Insufficient documentation

## 2018-11-27 DIAGNOSIS — I5042 Chronic combined systolic (congestive) and diastolic (congestive) heart failure: Secondary | ICD-10-CM | POA: Diagnosis not present

## 2018-11-27 DIAGNOSIS — E079 Disorder of thyroid, unspecified: Secondary | ICD-10-CM | POA: Insufficient documentation

## 2018-11-27 DIAGNOSIS — R0789 Other chest pain: Principal | ICD-10-CM | POA: Diagnosis present

## 2018-11-27 DIAGNOSIS — Z79899 Other long term (current) drug therapy: Secondary | ICD-10-CM | POA: Diagnosis not present

## 2018-11-27 DIAGNOSIS — E1122 Type 2 diabetes mellitus with diabetic chronic kidney disease: Secondary | ICD-10-CM | POA: Diagnosis not present

## 2018-11-27 DIAGNOSIS — Z6830 Body mass index (BMI) 30.0-30.9, adult: Secondary | ICD-10-CM | POA: Insufficient documentation

## 2018-11-27 DIAGNOSIS — E1165 Type 2 diabetes mellitus with hyperglycemia: Secondary | ICD-10-CM | POA: Insufficient documentation

## 2018-11-27 DIAGNOSIS — E669 Obesity, unspecified: Secondary | ICD-10-CM | POA: Diagnosis present

## 2018-11-27 DIAGNOSIS — IMO0002 Reserved for concepts with insufficient information to code with codable children: Secondary | ICD-10-CM | POA: Diagnosis present

## 2018-11-27 DIAGNOSIS — Z833 Family history of diabetes mellitus: Secondary | ICD-10-CM | POA: Diagnosis not present

## 2018-11-27 DIAGNOSIS — Z888 Allergy status to other drugs, medicaments and biological substances status: Secondary | ICD-10-CM | POA: Insufficient documentation

## 2018-11-27 DIAGNOSIS — I249 Acute ischemic heart disease, unspecified: Secondary | ICD-10-CM | POA: Diagnosis present

## 2018-11-27 DIAGNOSIS — D631 Anemia in chronic kidney disease: Secondary | ICD-10-CM | POA: Diagnosis not present

## 2018-11-27 DIAGNOSIS — I152 Hypertension secondary to endocrine disorders: Secondary | ICD-10-CM | POA: Diagnosis present

## 2018-11-27 DIAGNOSIS — R079 Chest pain, unspecified: Secondary | ICD-10-CM

## 2018-11-27 DIAGNOSIS — Z7902 Long term (current) use of antithrombotics/antiplatelets: Secondary | ICD-10-CM | POA: Diagnosis not present

## 2018-11-27 DIAGNOSIS — I10 Essential (primary) hypertension: Secondary | ICD-10-CM | POA: Diagnosis present

## 2018-11-27 DIAGNOSIS — N184 Chronic kidney disease, stage 4 (severe): Secondary | ICD-10-CM | POA: Diagnosis present

## 2018-11-27 DIAGNOSIS — I4510 Unspecified right bundle-branch block: Secondary | ICD-10-CM | POA: Insufficient documentation

## 2018-11-27 HISTORY — DX: Chest pain, unspecified: R07.9

## 2018-11-27 LAB — CBC WITH DIFFERENTIAL/PLATELET
Abs Immature Granulocytes: 0.09 10*3/uL — ABNORMAL HIGH (ref 0.00–0.07)
Basophils Absolute: 0 10*3/uL (ref 0.0–0.1)
Basophils Relative: 0 %
Eosinophils Absolute: 0 10*3/uL (ref 0.0–0.5)
Eosinophils Relative: 0 %
HCT: 31 % — ABNORMAL LOW (ref 36.0–46.0)
Hemoglobin: 9.5 g/dL — ABNORMAL LOW (ref 12.0–15.0)
Immature Granulocytes: 1 %
Lymphocytes Relative: 13 %
Lymphs Abs: 1 10*3/uL (ref 0.7–4.0)
MCH: 27.9 pg (ref 26.0–34.0)
MCHC: 30.6 g/dL (ref 30.0–36.0)
MCV: 91.2 fL (ref 80.0–100.0)
Monocytes Absolute: 0.3 10*3/uL (ref 0.1–1.0)
Monocytes Relative: 4 %
Neutro Abs: 6.4 10*3/uL (ref 1.7–7.7)
Neutrophils Relative %: 82 %
Platelets: 303 10*3/uL (ref 150–400)
RBC: 3.4 MIL/uL — ABNORMAL LOW (ref 3.87–5.11)
RDW: 13 % (ref 11.5–15.5)
WBC: 7.9 10*3/uL (ref 4.0–10.5)
nRBC: 0 % (ref 0.0–0.2)

## 2018-11-27 LAB — TROPONIN I
Troponin I: <0.03 ng/mL (ref <0.03)
Troponin I: <0.03 ng/mL (ref <0.03)
Troponin I: <0.03 ng/mL (ref <0.03)
Troponin I: <0.03 ng/mL (ref <0.03)

## 2018-11-27 LAB — BASIC METABOLIC PANEL
Anion gap: 11 (ref 5–15)
BUN: 42 mg/dL — ABNORMAL HIGH (ref 8–23)
CO2: 21 mmol/L — ABNORMAL LOW (ref 22–32)
Calcium: 10 mg/dL (ref 8.9–10.3)
Chloride: 107 mmol/L (ref 98–111)
Creatinine, Ser: 2.21 mg/dL — ABNORMAL HIGH (ref 0.44–1.00)
GFR calc Af Amer: 26 mL/min — ABNORMAL LOW (ref >60)
GFR calc non Af Amer: 22 mL/min — ABNORMAL LOW (ref >60)
Glucose, Bld: 194 mg/dL — ABNORMAL HIGH (ref 70–99)
Potassium: 5 mmol/L (ref 3.5–5.1)
Sodium: 139 mmol/L (ref 135–145)

## 2018-11-27 LAB — GLUCOSE, CAPILLARY
Glucose-Capillary: 122 mg/dL — ABNORMAL HIGH (ref 70–99)
Glucose-Capillary: 127 mg/dL — ABNORMAL HIGH (ref 70–99)

## 2018-11-27 MED ORDER — NITROGLYCERIN 2 % TD OINT
1.0000 [in_us] | TOPICAL_OINTMENT | Freq: Once | TRANSDERMAL | Status: AC
  Administered 2018-11-27: 1 [in_us] via TOPICAL
  Filled 2018-11-27: qty 1

## 2018-11-27 MED ORDER — ENOXAPARIN SODIUM 30 MG/0.3ML ~~LOC~~ SOLN
30.0000 mg | SUBCUTANEOUS | Status: DC
  Administered 2018-11-27: 30 mg via SUBCUTANEOUS
  Filled 2018-11-27 (×2): qty 0.3

## 2018-11-27 MED ORDER — AMLODIPINE BESYLATE 10 MG PO TABS
10.0000 mg | ORAL_TABLET | Freq: Every day | ORAL | Status: DC
  Administered 2018-11-27 – 2018-11-28 (×2): 10 mg via ORAL
  Filled 2018-11-27 (×2): qty 1

## 2018-11-27 MED ORDER — VITAMIN D 25 MCG (1000 UNIT) PO TABS
2000.0000 [IU] | ORAL_TABLET | Freq: Every day | ORAL | Status: DC
  Administered 2018-11-28: 2000 [IU] via ORAL
  Filled 2018-11-27: qty 2

## 2018-11-27 MED ORDER — INSULIN ASPART 100 UNIT/ML ~~LOC~~ SOLN: 0.0000 [IU] | Freq: Three times a day (TID) | SUBCUTANEOUS | Status: DC

## 2018-11-27 MED ORDER — ACETAMINOPHEN 325 MG PO TABS
650.0000 mg | ORAL_TABLET | ORAL | Status: DC | PRN
  Filled 2018-11-27: qty 2

## 2018-11-27 MED ORDER — INSULIN ASPART 100 UNIT/ML ~~LOC~~ SOLN: 0.0000 [IU] | Freq: Every day | SUBCUTANEOUS | Status: DC

## 2018-11-27 MED ORDER — CLOPIDOGREL BISULFATE 75 MG PO TABS
75.0000 mg | ORAL_TABLET | Freq: Every day | ORAL | Status: DC
  Administered 2018-11-28: 75 mg via ORAL
  Filled 2018-11-27: qty 1

## 2018-11-27 MED ORDER — INDAPAMIDE 2.5 MG PO TABS
2.5000 mg | ORAL_TABLET | Freq: Every day | ORAL | Status: DC
  Administered 2018-11-27 – 2018-11-28 (×2): 2.5 mg via ORAL
  Filled 2018-11-27 (×2): qty 1

## 2018-11-27 MED ORDER — ONDANSETRON HCL 4 MG/2ML IJ SOLN: 4.0000 mg | Freq: Four times a day (QID) | INTRAMUSCULAR | Status: DC | PRN

## 2018-11-27 MED ORDER — LISINOPRIL 40 MG PO TABS
40.0000 mg | ORAL_TABLET | Freq: Every day | ORAL | Status: DC
  Administered 2018-11-27 – 2018-11-28 (×2): 40 mg via ORAL
  Filled 2018-11-27 (×2): qty 1

## 2018-11-27 MED ORDER — CARVEDILOL 25 MG PO TABS
25.0000 mg | ORAL_TABLET | Freq: Two times a day (BID) | ORAL | Status: DC
  Administered 2018-11-27 – 2018-11-28 (×2): 25 mg via ORAL
  Filled 2018-11-27 (×2): qty 1

## 2018-11-27 NOTE — H&P (Signed)
History and Physical    Teresa Poole WPY:[id number] DOB: 12/01/1950 DOA: 11/27/2018  PCP: Orpah Melter, MD  Patient coming from: home   I have personally briefly reviewed patient's old medical records available.   Chief Complaint: multiple complaints. " my blood pressure is high"  HPI: Teresa Poole is a 68 y.o. female with medical history significant of renal artery stenosis status post stenting, hypertension which is uncontrolled, diabetes and obesity, has history of coronary artery disease on Plavix, she had cardiac cath in 2015 that showed mild disease presenting to the emergency room with multiple complaints including whole body ache, chest pain, left arm pain and high blood pressure.  Patient is anxious and has many complaints.  According the patient, she has developed extreme weakness these days and was very emotional about talking about getting sleepy all the time.  She says that she developed right foot pain, knee pain, back pain, shoulder pain and neck pain for about 2 to 3 weeks, went to see her PCP who prescribed her steroids 2 days ago with some relief.  Patient also having occasional, intermittent left shoulder and arm pain which is ongoing for 2 to 3 weeks, moderate in intensity, she is taking extra doses of aspirin with no relief.  No real chest pain or shortness of breath.  No nausea vomiting.  No flulike symptoms.  No cough congestion.  Bowel habits are normal.  Urine habits are normal.  No weight gain or weight loss.  Right foot joint swelling but no edema. Today morning, she woke up her blood pressure was more than 200 she took her medications, she checked her blood pressures 8 times and it will remain more than 190 so she thought she will need help and called EMS. ED Course: Blood pressures 053 systolic.  Otherwise hemodynamically stable.  Creatinine 2.21 which is fairly at baseline.  EKG shows right bundle branch block not present on previous EKGs.  No ST-T wave  changes.  Troponins normal.  Due to elevated blood pressures and high risk factors ER physician advised hospitalization and monitoring.  Review of Systems: As per HPI otherwise 10 point review of systems negative.    Past Medical History:  Diagnosis Date   Allergy    Anemia    CAD (coronary artery disease)    non-obstructive by LHC (09/25/2013): Proximal and mid LAD serial 20%, proximal circumflex 30%, mid AV groove circumflex 30%, mid RCA mild plaque.   Cancer ([place])    Carotid stenosis    a. Carotid US (09/26/2011): Bilateral 1-39% ICA.   Chronic combined systolic and diastolic CHF (congestive heart failure) (HCC)    CKD (chronic kidney disease)    Diabetes mellitus without complication (HCC)    GERD (gastroesophageal reflux disease)    History of ovarian cancer    Hyperlipidemia    Hypertension    NICM (nonischemic cardiomyopathy) ([place])    a. Echocardiogram (09/08/2013): Mild LVH, EF 30-35%, no WMA, mild LAE.   Obesity (BMI 30-39.9)    Thyroid disease    Seen by specialist    Past Surgical History:  Procedure Laterality Date   LEFT HEART CATHETERIZATION WITH CORONARY ANGIOGRAM N/A 09/25/2013   Procedure: LEFT HEART CATHETERIZATION WITH CORONARY ANGIOGRAM;  Surgeon: Burnell Blanks, MD;  Location: [facility] CATH LAB;  Service: Cardiovascular;  Laterality: N/A;   RENAL ANGIOGRAPHY N/A 10/06/2018   Procedure: RENAL ANGIOGRAPHY;  Surgeon: Marty Heck, MD;  Location: [place] [facility] CV LAB;  Service: Cardiovascular;  Laterality: N/A;  reports that she has never smoked. She has never used smokeless tobacco. She reports that she does not drink alcohol or use drugs.  Allergies  Allergen Reactions   Hydralazine Hcl Itching and Other (See Comments)    ENTIRE BODY = burning sensation, also in the eyes (they have become very sensitive to light)    Family History  Problem Relation Age of Onset   Diabetes Father    Hypertension Father    Hypertension Maternal  Grandfather      Prior to Admission medications   Medication Sig Start Date End Date Taking? Authorizing Provider  amLODipine (NORVASC) 10 MG tablet Take 1 tablet (10 mg total) by mouth daily. 10/10/18   Copland, Gay Filler, MD  atorvastatin (LIPITOR) 40 MG tablet Take 1 tablet (40 mg total) by mouth every evening. 10/10/18   Copland, Gay Filler, MD  carvedilol (COREG) 25 MG tablet Take 25 mg by mouth 2 (two) times daily with a meal.    [provider]  Cholecalciferol (VITAMIN D3) 25 MCG (1000 UT) CAPS Take 2,000 Units by mouth daily with breakfast.    [provider]  CINNAMON PO Take 1 capsule by mouth daily with breakfast.    [provider]  clopidogrel (PLAVIX) 75 MG tablet Take 1 tablet (75 mg total) by mouth daily with breakfast. 10/08/18   Dessa Phi, DO  Dulaglutide (TRULICITY) 5.10 CH/8.5ID SOPN Inject 0.75 mg into the skin every Sunday. 11/27/18   Elayne Snare, MD  glucose blood (ONE TOUCH ULTRA TEST) test strip Use to check blood sugar 3 times daily Dx code E11.65 11/20/14   Elayne Snare, MD  indapamide (LOZOL) 2.5 MG tablet  09/27/18   [provider]  lisinopril (PRINIVIL,ZESTRIL) 40 MG tablet Take 1 tablet (40 mg total) by mouth daily. 10/10/18   Copland, Gay Filler, MD  Multiple Vitamins-Minerals (MULTIVITAMIN GUMMIES ADULT) CHEW Chew 1 tablet by mouth daily with breakfast.    [provider]  Omega-3 Fatty Acids (FISH OIL PO) Take 1 capsule by mouth daily with breakfast.    [provider]  ONETOUCH VERIO test strip Gordon Patient taking differently: 1 each by Other route 3 (three) times daily.  03/30/18   Elayne Snare, MD  TURMERIC PO Take 1 capsule by mouth daily with breakfast.    [provider]  VITAMIN E PO Take 1 capsule by mouth daily with breakfast.    [provider]    Physical Exam: Vitals:   11/27/18 0730 11/27/18 0745 11/27/18 0800 11/27/18 0815  BP: (!) 190/83 (!) 194/89  (!) 193/78 (!) 196/81  Pulse: 78 75 77 80  Resp: 18 10 13 15   Temp:      TempSrc:      SpO2: 98% 98% 98% 98%    Constitutional: NAD, calm, comfortable Vitals:   11/27/18 0730 11/27/18 0745 11/27/18 0800 11/27/18 0815  BP: (!) 190/83 (!) 194/89 (!) 193/78 (!) 196/81  Pulse: 78 75 77 80  Resp: 18 10 13 15   Temp:      TempSrc:      SpO2: 98% 98% 98% 98%   Eyes: PERRL, lids and conjunctivae normal Comfortable on room air.  She is walking to the bathroom. ENMT: Mucous membranes are moist. Posterior pharynx clear of any exudate or lesions.Normal dentition.  Neck: normal, supple, no masses, no thyromegaly Respiratory: clear to auscultation bilaterally, no wheezing, no crackles. Normal respiratory effort. No accessory muscle use.  Cardiovascular: Regular rate and  rhythm, no murmurs / rubs / gallops. No extremity edema. 2+ pedal pulses. No carotid bruits.  Abdomen: no tenderness, no masses palpated. No hepatosplenomegaly. Bowel sounds positive.  Musculoskeletal: no clubbing / cyanosis. No joint deformity upper and lower extremities. Good ROM, no contractures. Normal muscle tone.  Skin: no rashes, lesions, ulcers. No induration Neurologic: CN 2-12 grossly intact. Sensation intact, DTR normal. Strength 5/5 in all 4.  Psychiatric: Normal judgment and insight. Alert and oriented x 3.  Anxious and tearful at times.    Labs on Admission: I have personally reviewed following labs and imaging studies  CBC: Recent Labs  Lab 11/27/18 0649  WBC 7.9  NEUTROABS 6.4  HGB 9.5*  HCT 31.0*  MCV 91.2  PLT 841   Basic Metabolic Panel: Recent Labs  Lab 11/27/18 0649  NA 139  K 5.0  CL 107  CO2 21*  GLUCOSE 194*  BUN 42*  CREATININE 2.21*  CALCIUM 10.0   GFR: CrCl cannot be calculated (Unknown ideal weight.). Liver Function Tests: No results for input(s): AST, ALT, ALKPHOS, BILITOT, PROT, ALBUMIN in the last 168 hours. No results for input(s): LIPASE, AMYLASE in the last 168  hours. No results for input(s): AMMONIA in the last 168 hours. Coagulation Profile: No results for input(s): INR, PROTIME in the last 168 hours. Cardiac Enzymes: Recent Labs  Lab 11/27/18 0649  TROPONINI <0.03   BNP (last 3 results) No results for input(s): PROBNP in the last 8760 hours. HbA1C: No results for input(s): HGBA1C in the last 72 hours. CBG: No results for input(s): GLUCAP in the last 168 hours. Lipid Profile: No results for input(s): CHOL, HDL, LDLCALC, TRIG, CHOLHDL, LDLDIRECT in the last 72 hours. Thyroid Function Tests: No results for input(s): TSH, T4TOTAL, FREET4, T3FREE, THYROIDAB in the last 72 hours. Anemia Panel: No results for input(s): VITAMINB12, FOLATE, FERRITIN, TIBC, IRON, RETICCTPCT in the last 72 hours. Urine analysis:    Component Value Date/Time   COLORURINE STRAW (A) 10/04/2018 1750   APPEARANCEUR CLEAR 10/04/2018 1750   LABSPEC 1.012 10/04/2018 1750   PHURINE 5.0 10/04/2018 1750   GLUCOSEU NEGATIVE 10/04/2018 1750   HGBUR NEGATIVE 10/04/2018 1750   BILIRUBINUR NEGATIVE 10/04/2018 1750   KETONESUR NEGATIVE 10/04/2018 1750   PROTEINUR 100 (A) 10/04/2018 1750   UROBILINOGEN 0.2 09/22/2013 1451   NITRITE NEGATIVE 10/04/2018 1750   LEUKOCYTESUR SMALL (A) 10/04/2018 1750    Radiological Exams on Admission: Dg Chest 2 View  Result Date: 11/27/2018 CLINICAL DATA:  Chest pain EXAM: CHEST - 2 VIEW COMPARISON:  11/10/2013 FINDINGS: Mildly low lung volumes, accentuated by positioning. There is no edema, consolidation, effusion, or pneumothorax. Normal heart size. Negative mediastinal contours. IMPRESSION: Negative chest. Electronically Signed   By: Monte Fantasia M.D.   On: 11/27/2018 07:08    EKG: Independently reviewed.  No acute ST-T wave changes.  Right bundle branch block.  Assessment/Plan Principal Problem:   Chest pain Active Problems:   Uncontrolled hypertension   Diabetes mellitus type II, uncontrolled (HCC)   Obesity (BMI 30-39.9)    CKD (chronic kidney disease), stage III ([place])   Renal artery stenosis ([place])     1.  Chest pain: Probably atypical.  Suspect generalized body ache.  Rule out acute coronary syndrome. We will admit patient to the telemetry unit given severity of symptoms. Currently chest pain improved with nitroglycerin. Cycle EKG and troponins. Supplemental oxygen to keep saturations more than 90%. Aspirin and Plavix. Morphine for severe and recurrent pain. Resume beta-blockers.  2.  Uncontrolled hypertension: Probably aggravated by recent use of steroids.  Will discontinue steroids.  Resume home medications and increase doses as needed.  Currently no indication to escalate her medications.  Her blood pressure right now is 162/81.  3.  Type 2 diabetes: Well controlled as per patient.  She is on once a week injectable.  Will keep on sliding scale insulin.  4.  CKD stage III: Her creatinine is at her baseline.  5.  Anemia of chronic disease: Her hemoglobin is on her baseline.    DVT prophylaxis: Heparin subcu Code Status: Full code Family Communication: None Disposition Plan: Home.  Anticipate tomorrow. Consults called: .none  Admission status: Cardiac telemetry.  Rule out acute coronary syndrome   Barb Merino MD [facility] Pager [phone]  If 7PM-7AM, please contact night-coverage [email or web address] Password [email or web address]  11/27/2018, 9:18 AM

## 2018-11-27 NOTE — Progress Notes (Signed)
Patient has home medications at bedside. Patient has declined to have her home medications locked in the pharmacy. Patient educated about taking hospital supplied medications vs her own home medications. Patient verbalizes understanding.

## 2018-11-27 NOTE — ED Notes (Addendum)
ED TO INPATIENT HANDOFF REPORT  ED Nurse Name and Phone #:  Gwynn Burly Name/Age/Gender Teresa Poole 68 y.o. female Room/Bed: 035C/035C  Code Status   Code Status: Full Code  Home/SNF/Other Home Patient oriented to: self, place, time and situation Is this baseline? Yes   Triage Complete: Triage complete  Chief Complaint chest pain  Triage Note Last night pt was feeling some weight in her chest as well as arm. When she awoke this morning she was diaphoretic, and the pain was still was still present. After checking BP several times she decided to dial EMS.   Allergies Allergies  Allergen Reactions  . Hydralazine Hcl Itching and Other (See Comments)    ENTIRE BODY = burning sensation, also in the eyes (they have become very sensitive to light)    Level of Care/Admitting Diagnosis ED Disposition    ED Disposition Condition [place]: [place] [[id number]]  Level of Care: Telemetry Cardiac [103]  I expect the patient will be discharged within 24 hours: Yes  LOW acuity---Tx typically complete <24 hrs---ACUTE conditions typically can be evaluated <24 hours---LABS likely to return to acceptable levels <24 hours---IS near functional baseline---EXPECTED to return to current living arrangement---NOT newly hypoxic: Meets criteria for 5C-Observation unit  Diagnosis: Chest pain [637858]  Admitting Physician: Barb Merino [[id number]]  Attending Physician: Barb Merino [[id number]]  PT Class (Do Not Modify): Observation [104]  PT Acc Code (Do Not Modify): Observation [10022]       B Medical/Surgery History Past Medical History:  Diagnosis Date  . Allergy   . Anemia   . CAD (coronary artery disease)    non-obstructive by LHC (09/25/2013): Proximal and mid LAD serial 20%, proximal circumflex 30%, mid AV groove circumflex 30%, mid RCA mild plaque.  . Cancer ([place])   . Carotid stenosis    a. Carotid US (09/26/2011): Bilateral 1-39% ICA.  [place] Kitchen  Chronic combined systolic and diastolic CHF (congestive heart failure) ([place])   . CKD (chronic kidney disease)   . Diabetes mellitus without complication ([place])   . GERD (gastroesophageal reflux disease)   . History of ovarian cancer   . Hyperlipidemia   . Hypertension   . NICM (nonischemic cardiomyopathy) ([place])    a. Echocardiogram (09/08/2013): Mild LVH, EF 30-35%, no WMA, mild LAE.  [place] Kitchen Obesity (BMI 30-39.9)   . Thyroid disease    Seen by specialist   Past Surgical History:  Procedure Laterality Date  . LEFT HEART CATHETERIZATION WITH CORONARY ANGIOGRAM N/A 09/25/2013   Procedure: LEFT HEART CATHETERIZATION WITH CORONARY ANGIOGRAM;  Surgeon: Burnell Blanks, MD;  Location: [facility] CATH LAB;  Service: Cardiovascular;  Laterality: N/A;  . RENAL ANGIOGRAPHY N/A 10/06/2018   Procedure: RENAL ANGIOGRAPHY;  Surgeon: Marty Heck, MD;  Location: [place] [facility] CV LAB;  Service: Cardiovascular;  Laterality: N/A;     A IV Location/Drains/Wounds Patient Lines/Drains/Airways Status   Active Line/Drains/Airways    Name:   Placement date:   Placement time:   Site:   Days:   Peripheral IV 11/27/18 Right Hand   11/27/18    0633    Hand   less than 1          Intake/Output Last 24 hours No intake or output data in the 24 hours ending 11/27/18 6720  Labs/Imaging Results for orders placed or performed during the hospital encounter of 11/27/18 (from the past 48 hour(s))  CBC with Differential     Status: Abnormal  Collection Time: 11/27/18  6:49 AM  Result Value Ref Range   WBC 7.9 4.0 - 10.5 K/uL   RBC 3.40 (L) 3.87 - 5.11 MIL/uL   Hemoglobin 9.5 (L) 12.0 - 15.0 g/dL   HCT 31.0 (L) 36.0 - 46.0 %   MCV 91.2 80.0 - 100.0 fL   MCH 27.9 26.0 - 34.0 pg   MCHC 30.6 30.0 - 36.0 g/dL   RDW 13.0 11.5 - 15.5 %   Platelets 303 150 - 400 K/uL   nRBC 0.0 0.0 - 0.2 %   Neutrophils Relative % 82 %   Neutro Abs 6.4 1.7 - 7.7 K/uL   Lymphocytes Relative 13 %   Lymphs Abs 1.0 0.7 - 4.0 K/uL    Monocytes Relative 4 %   Monocytes Absolute 0.3 0.1 - 1.0 K/uL   Eosinophils Relative 0 %   Eosinophils Absolute 0.0 0.0 - 0.5 K/uL   Basophils Relative 0 %   Basophils Absolute 0.0 0.0 - 0.1 K/uL   Immature Granulocytes 1 %   Abs Immature Granulocytes 0.09 (H) 0.00 - 0.07 K/uL    Comment: Performed at [facility], [address] [place], [place] [address]  Basic metabolic panel     Status: Abnormal   Collection Time: 11/27/18  6:49 AM  Result Value Ref Range   Sodium 139 135 - 145 mmol/L   Potassium 5.0 3.5 - 5.1 mmol/L   Chloride 107 98 - 111 mmol/L   CO2 21 (L) 22 - 32 mmol/L   Glucose, Bld 194 (H) 70 - 99 mg/dL   BUN 42 (H) 8 - 23 mg/dL   Creatinine, Ser 2.21 (H) 0.44 - 1.00 mg/dL   Calcium 10.0 8.9 - 10.3 mg/dL   GFR calc non Af Amer 22 (L) >60 mL/min   GFR calc Af Amer 26 (L) >60 mL/min   Anion gap 11 5 - 15    Comment: Performed at [facility], [place] [address], [place], [place] [address]  Troponin I - ONCE - STAT     Status: None   Collection Time: 11/27/18  6:49 AM  Result Value Ref Range   Troponin I <0.03 <0.03 ng/mL    Comment: Performed at [facility] [address], [place], [place] [address]   Dg Chest 2 View  Result Date: 11/27/2018 CLINICAL DATA:  Chest pain EXAM: CHEST - 2 VIEW COMPARISON:  11/10/2013 FINDINGS: Mildly low lung volumes, accentuated by positioning. There is no edema, consolidation, effusion, or pneumothorax. Normal heart size. Negative mediastinal contours. IMPRESSION: Negative chest. Electronically Signed   By: Monte Fantasia M.D.   On: 11/27/2018 07:08    Pending Labs Unresulted Labs (From admission, onward)    Start     Ordered   11/27/18 0916  Troponin I - Now Then Q6H  Now then every 6 hours,   R     11/27/18 0915          Vitals/Pain Today's Vitals   11/27/18 0815 11/27/18 0830 11/27/18 0845 11/27/18 0930  BP: (!) 196/81 (!) 188/80 (!) 162/81   Pulse: 80 81 87 78  Resp: 15 19 (!) 29 (!) 25  Temp:       TempSrc:      SpO2: 98% 97% 99% 98%  PainSc:        Isolation Precautions No active isolations  Medications Medications  insulin aspart (novoLOG) injection 0-9 Units (has no administration in time range)  insulin aspart (novoLOG) injection  0-5 Units (has no administration in time range)  acetaminophen (TYLENOL) tablet 650 mg (has no administration in time range)  ondansetron (ZOFRAN) injection 4 mg (has no administration in time range)  enoxaparin (LOVENOX) injection 40 mg (has no administration in time range)  nitroGLYCERIN (NITROGLYN) 2 % ointment 1 inch (1 inch Topical Given 11/27/18 0726)    Mobility walks Moderate fall risk   Focused Assessments Cardiac Assessment Handoff:  Cardiac Rhythm: Normal sinus rhythm Lab Results  Component Value Date   TROPONINI <0.03 11/27/2018   No results found for: DDIMER Does the Patient currently have chest pain? No     R Recommendations: See Admitting Provider Note  Report given to:   Additional Notes:  Suspect generalized body ache.  Rule out acute coronary syndrome. Admit patient to the telemetry unit given severity of symptoms. Currently chest pain improved with nitroglycerin. Cycle EKG and troponins. Supplemental oxygen to keep saturations more than 90%. Aspirin and Plavix. Morphine for severe and recurrent pain. Resume beta-blockers.

## 2018-11-27 NOTE — ED Provider Notes (Signed)
68 year old female known history of coronary artery disease, hypertension, hypercholesterolemia, diabetes, morbid obesity resents today complaining of chest pain that began last night.  She can have chest pain this morning and woke diaphoretic.  eKG here is significant for new right bundle branch block although some QRS widening was noted on prior.  No acute ST changes noted.  Troponin is normal.  Patient has received aspirin and Nitropaste.  Plan admission for further evaluation Patient pain free now Discussed with Dr. Sloan Leiter and will see for admission   Pattricia Boss, MD 11/28/18 1029

## 2018-11-27 NOTE — ED Provider Notes (Signed)
[facility] EMERGENCY DEPARTMENT Provider Note   CSN: [id number] Arrival date & time: 11/27/18  2992    History   Chief Complaint Chief Complaint  Patient presents with  . Chest Pain  . Extremity Pain    HPI LEONORA GORES is a 68 y.o. female.     HPI  This is a 68 year old female with a history of coronary artery disease, chronic kidney disease, diabetes, hypertension, hyperlipidemia who presents with chest pain.  Patient reports onset of chest discomfort yesterday.  She reports a tightness that radiates into her left arm.  She took her blood pressure multiple times yesterday and noted it to be elevated between 170 and 200.  She reports compliance with her blood pressure medication.  She denies any exertional nature to the pain.  Currently her pain is 3 out of 10.  She did receive nitroglycerin and aspirin by EMS.  She denies any shortness of breath.  She states that this morning she woke up sweating with chest pain and took her blood pressure and it was in the 200s.  Past Medical History:  Diagnosis Date  . Allergy   . Anemia   . CAD (coronary artery disease)    non-obstructive by LHC (09/25/2013): Proximal and mid LAD serial 20%, proximal circumflex 30%, mid AV groove circumflex 30%, mid RCA mild plaque.  . Cancer ([place])   . Carotid stenosis    a. Carotid US (09/26/2011): Bilateral 1-39% ICA.  Marland Kitchen Chronic combined systolic and diastolic CHF (congestive heart failure) ([place])   . CKD (chronic kidney disease)   . Diabetes mellitus without complication ([place])   . GERD (gastroesophageal reflux disease)   . History of ovarian cancer   . Hyperlipidemia   . Hypertension   . NICM (nonischemic cardiomyopathy) ([place])    a. Echocardiogram (09/08/2013): Mild LVH, EF 30-35%, no WMA, mild LAE.  Marland Kitchen Obesity (BMI 30-39.9)   . Thyroid disease    Seen by specialist    Patient Active Problem List   Diagnosis Date Noted  . Renal artery stenosis ([place]) 10/11/2018  . Mixed  hyperlipidemia 10/26/2013  . Chronic combined systolic and diastolic heart failure ([place]) 09/28/2013  . CKD (chronic kidney disease), stage III ([place]) 09/24/2013  . Hypertensive urgency 09/18/2013  . Cardiomyopathy, hypertensive ([place]) 09/18/2013  . Edema 09/14/2013  . Acute combined systolic and diastolic congestive heart failure ([place]) 09/09/2013  . Hypertensive heart disease with CHF (congestive heart failure) ([place]) 09/09/2013  . Uncontrolled hypertension 09/07/2013  . Diabetes mellitus type II, uncontrolled ([place]) 09/07/2013  . Obesity (BMI 30-39.9)   . History of ovarian cancer     Past Surgical History:  Procedure Laterality Date  . LEFT HEART CATHETERIZATION WITH CORONARY ANGIOGRAM N/A 09/25/2013   Procedure: LEFT HEART CATHETERIZATION WITH CORONARY ANGIOGRAM;  Surgeon: Burnell Blanks, MD;  Location: [facility] CATH LAB;  Service: Cardiovascular;  Laterality: N/A;  . RENAL ANGIOGRAPHY N/A 10/06/2018   Procedure: RENAL ANGIOGRAPHY;  Surgeon: Marty Heck, MD;  Location: [facility] CV LAB;  Service: Cardiovascular;  Laterality: N/A;     OB History   No obstetric history on file.      Home Medications    Prior to Admission medications   Medication Sig Start Date End Date Taking? Authorizing Provider  amLODipine (NORVASC) 10 MG tablet Take 1 tablet (10 mg total) by mouth daily. 10/10/18   Copland, Gay Filler, MD  atorvastatin (LIPITOR) 40 MG tablet Take 1 tablet (40 mg total) by mouth every  evening. 10/10/18   Copland, Gay Filler, MD  carvedilol (COREG) 25 MG tablet Take 25 mg by mouth 2 (two) times daily with a meal.    [provider]  Cholecalciferol (VITAMIN D3) 25 MCG (1000 UT) CAPS Take 2,000 Units by mouth daily with breakfast.    [provider]  CINNAMON PO Take 1 capsule by mouth daily with breakfast.    [provider]  clopidogrel (PLAVIX) 75 MG tablet Take 1 tablet (75 mg total) by mouth daily with breakfast. 10/08/18   Dessa Phi, DO   Dulaglutide (TRULICITY) 0.10 OF/1.2RF SOPN Inject 0.75 mg into the skin every Sunday. 11/27/18   Elayne Snare, MD  glucose blood (ONE TOUCH ULTRA TEST) test strip Use to check blood sugar 3 times daily Dx code E11.65 11/20/14   Elayne Snare, MD  indapamide (LOZOL) 2.5 MG tablet  09/27/18   [provider]  lisinopril (PRINIVIL,ZESTRIL) 40 MG tablet Take 1 tablet (40 mg total) by mouth daily. 10/10/18   Copland, Gay Filler, MD  Multiple Vitamins-Minerals (MULTIVITAMIN GUMMIES ADULT) CHEW Chew 1 tablet by mouth daily with breakfast.    [provider]  Omega-3 Fatty Acids (FISH OIL PO) Take 1 capsule by mouth daily with breakfast.    [provider]  ONETOUCH VERIO test strip [place] Patient taking differently: 1 each by Other route 3 (three) times daily.  03/30/18   Elayne Snare, MD  TURMERIC PO Take 1 capsule by mouth daily with breakfast.    [provider]  VITAMIN E PO Take 1 capsule by mouth daily with breakfast.    [provider]    Family History Family History  Problem Relation Age of Onset  . Diabetes Father   . Hypertension Father   . Hypertension Maternal Grandfather     Social History Social History   Tobacco Use  . Smoking status: Never Smoker  . Smokeless tobacco: Never Used  Substance Use Topics  . Alcohol use: No  . Drug use: No     Allergies   Hydralazine hcl   Review of Systems Review of Systems  Constitutional: Positive for diaphoresis. Negative for fever.  Respiratory: Negative for cough and shortness of breath.   Cardiovascular: Positive for chest pain.  Gastrointestinal: Negative for abdominal pain, nausea and vomiting.  Genitourinary: Negative for dysuria.  Musculoskeletal: Negative for back pain.  Neurological: Negative for numbness.  All other systems reviewed and are negative.    Physical Exam Updated Vital Signs BP (!) 188/86   Pulse 80   Temp 98 F (36.7 C) (Oral)   Resp 14    SpO2 98%   Physical Exam Vitals signs and nursing note reviewed.  Constitutional:      Appearance: She is well-developed. She is obese.  HENT:     Head: Normocephalic and atraumatic.  Eyes:     Pupils: Pupils are equal, round, and reactive to light.  Neck:     Musculoskeletal: Neck supple.  Cardiovascular:     Rate and Rhythm: Normal rate and regular rhythm.     Heart sounds: Normal heart sounds.  Pulmonary:     Effort: Pulmonary effort is normal. No respiratory distress.     Breath sounds: No wheezing.  Abdominal:     General: Bowel sounds are normal.     Palpations: Abdomen is soft.  Musculoskeletal:     Right lower leg: No edema.     Left lower leg: No edema.  Skin:  General: Skin is warm and dry.  Neurological:     Mental Status: She is alert and oriented to person, place, and time.      ED Treatments / Results  Labs (all labs ordered are listed, but only abnormal results are displayed) Labs Reviewed  CBC WITH DIFFERENTIAL/PLATELET - Abnormal; Notable for the following components:      Result Value   RBC 3.40 (*)    Hemoglobin 9.5 (*)    HCT 31.0 (*)    Abs Immature Granulocytes 0.09 (*)    All other components within normal limits  BASIC METABOLIC PANEL  TROPONIN I    EKG EKG Interpretation  Date/Time:  Sunday November 27 2018 06:31:51 EDT Ventricular Rate:  87 PR Interval:    QRS Duration: 157 QT Interval:  415 QTC Calculation: 500 R Axis:   6 Text Interpretation:  Sinus rhythm Right bundle branch block RBBB, new Confirmed by Thayer Jew [phone]) on 11/27/2018 6:37:05 AM   Radiology Dg Chest 2 View  Result Date: 11/27/2018 CLINICAL DATA:  Chest pain EXAM: CHEST - 2 VIEW COMPARISON:  11/10/2013 FINDINGS: Mildly low lung volumes, accentuated by positioning. There is no edema, consolidation, effusion, or pneumothorax. Normal heart size. Negative mediastinal contours. IMPRESSION: Negative chest. Electronically Signed   By: Monte Fantasia M.D.   On:  11/27/2018 07:08    Procedures Procedures (including critical care time)  Medications Ordered in ED Medications  nitroGLYCERIN (NITROGLYN) 2 % ointment 1 inch (1 inch Topical Given 11/27/18 0726)     Initial Impression / Assessment and Plan / ED Course  I have reviewed the triage vital signs and the nursing notes.  Pertinent labs & imaging results that were available during my care of the patient were reviewed by me and considered in my medical decision making (see chart for details).        Presents with chest pain.  Character of pain and radiation is suggestive of ACS.  She has a history of coronary artery disease with a last cardiac catheterization in 2015.  She is overall nontoxic-appearing.  EKG shows evidence of right bundle branch block which is new from prior.  Chest x-ray shows no evidence of pneumothorax or pneumonia.  Patient was given nitroglycerin ointment with improvement of her blood pressure.  Troponin and basic metabolic panel are pending.  This patient will need admission and further evaluation given her risk factors and known history of coronary artery disease.  Final Clinical Impressions(s) / ED Diagnoses   Final diagnoses:  ACS (acute coronary syndrome) [facility])    ED Discharge Orders    None       Merryl Hacker, MD 11/27/18 [phone]

## 2018-11-27 NOTE — Progress Notes (Signed)
Patient arrived to room 3e21 via stretcher from ED. Patient is alert and oriented in NAD. VSS. Orders reviewed and released. Patient oriented to room and unit. Bed is in the lowest and locked position with bed rails up times 2. Belongings and call bell within reach.

## 2018-11-27 NOTE — ED Notes (Signed)
Teresa Poole pts sister [phone] wants a status

## 2018-11-27 NOTE — Progress Notes (Addendum)
Patient is refusing subcutaneous insulin at this time. Does not feel the need to take 1 unit of insulin for BG 122. Patient educated on glucose control while inpatient. Patient verbalizes understanding.

## 2018-11-27 NOTE — ED Triage Notes (Signed)
Last night pt was feeling some weight in her chest as well as arm. When she awoke this morning she was diaphoretic, and the pain was still was still present. After checking BP several times she decided to dial EMS.

## 2018-11-28 ENCOUNTER — Other Ambulatory Visit: Payer: Self-pay

## 2018-11-28 DIAGNOSIS — E1122 Type 2 diabetes mellitus with diabetic chronic kidney disease: Secondary | ICD-10-CM | POA: Diagnosis not present

## 2018-11-28 DIAGNOSIS — I130 Hypertensive heart and chronic kidney disease with heart failure and stage 1 through stage 4 chronic kidney disease, or unspecified chronic kidney disease: Secondary | ICD-10-CM | POA: Diagnosis not present

## 2018-11-28 DIAGNOSIS — R079 Chest pain, unspecified: Secondary | ICD-10-CM

## 2018-11-28 DIAGNOSIS — I5042 Chronic combined systolic (congestive) and diastolic (congestive) heart failure: Secondary | ICD-10-CM | POA: Diagnosis not present

## 2018-11-28 DIAGNOSIS — R0789 Other chest pain: Secondary | ICD-10-CM | POA: Diagnosis not present

## 2018-11-28 LAB — GLUCOSE, CAPILLARY
Glucose-Capillary: 122 mg/dL — ABNORMAL HIGH (ref 70–99)
Glucose-Capillary: 118 mg/dL — ABNORMAL HIGH (ref 70–99)

## 2018-11-28 NOTE — Discharge Summary (Signed)
Physician Discharge Summary  Teresa Poole JME:[id number] DOB: 11/07/1950 DOA: 11/27/2018  PCP: Orpah Melter, MD  Admit date: 11/27/2018 Discharge date: 11/28/2018  Time spent: 45 minutes  Recommendations for Outpatient Follow-up:  1. Follow up with PCP tomorrow as scheduled. Recommend cardiology referral. Monitor for BP control  Discharge Diagnoses:  Principal Problem:   Chest pain Active Problems:   Uncontrolled hypertension   Diabetes mellitus type II, uncontrolled (HCC)   Obesity (BMI 30-39.9)   CKD (chronic kidney disease), stage III ([place])   Renal artery stenosis ([place])   Discharge Condition: stable and pain free  Diet recommendation: heart healthy carb modified  Filed Weights   11/27/18 1031 11/28/18 0500  Weight: 85 kg 84.6 kg    History of present illness:  Teresa Poole is a 68 y.o. female with medical history significant of renal artery stenosis status post stenting, hypertension which is uncontrolled, diabetes and obesity, has history of coronary artery disease on Plavix, she had cardiac cath in 2015 that showed mild disease presented to the emergency dept on 11/28/18 with multiple complaints including whole body ache, chest pain, left arm pain and high blood pressure.  Patient was anxious and had many complaints.  According the patient, she developed extreme weakness and was very emotional about talking about getting sleepy all the time.  She reported that she developed right foot pain, knee pain, back pain, shoulder pain and neck pain for about 2 to 3 weeks, went to see her PCP who prescribed her steroids 2 days ago with some relief.  Patient also reported occasional, intermittent left shoulder and arm pain which was ongoing for 2 to 3 weeks, moderate in intensity, she was taking extra doses of aspirin with no relief.  No real chest pain or shortness of breath.  No nausea vomiting.  No flulike symptoms.  No cough congestion.  Bowel habits are normal.  Urine habits are  normal.  No weight gain or weight loss.  Right foot joint swelling but no edema. On day of admission, she woke up her blood pressure was more than 200 she took her medications, she checked her blood pressures 8 times and it will remain more than 190 so she thought she will need help and called EMS.  Hospital Course:  1.  Chest pain: Atypical. Heart score 4. Likely generalized body ache. Chest pain improved with nitroglycerin. Troponin negative x3. ekg with new RBBB. Chest xray without acute abnormality.  Continued asa and plavix. BP with improved control. Has apt with PCP day after discharge and cardiology referral planned per patient  2.  Uncontrolled hypertension: Probably aggravated by recent use of steroids. Steroids stopped in hospital. BP improved. will discontinue steroids at discharge (prednisone 20mg  daily). No indication to escalate her medications at discharge. BP controlled at discharge  3.  Type 2 diabetes: Well controlled as per patient. A1c 6.6.  4.  CKD stage III: Her creatinine is at her baseline.  5.  Anemia of chronic disease: Her hemoglobin is on her baseline Procedures:    Consultations:    Discharge Exam: Vitals:   11/28/18 0500 11/28/18 0818  BP: 139/61 (!) 150/67  Pulse: 67 66  Resp: 18 16  Temp: 97.8 F (36.6 C) 98 F (36.7 C)  SpO2: 99% 98%    General: ambulating in room with steady gait. Smiling in no acute distress. Quite talkative Cardiovascular: rrr no mgr no LE edema Respiratory: normal effort BS clear bilaterally no wheeze  Discharge Instructions   Discharge  Instructions    Call MD for:  difficulty breathing, headache or visual disturbances   Complete by:  As directed    Call MD for:  persistant dizziness or light-headedness   Complete by:  As directed    Call MD for:  persistant nausea and vomiting   Complete by:  As directed    Diet - low sodium heart healthy   Complete by:  As directed    Discharge instructions   Complete by:  As  directed    Take medication as directed Follow up with PCP as scheduled Recommend cardiology referral   Increase activity slowly   Complete by:  As directed      Allergies as of 11/28/2018      Reactions   Hydralazine Hcl Itching, Other (See Comments)   ENTIRE BODY = burning sensation, also in the eyes (they have become very sensitive to light)   Atorvastatin Other (See Comments)   Per MD      Medication List    STOP taking these medications   predniSONE 20 MG tablet Commonly known as:  DELTASONE     TAKE these medications   amLODipine 10 MG tablet Commonly known as:  NORVASC Take 1 tablet (10 mg total) by mouth daily.   carvedilol 25 MG tablet Commonly known as:  COREG Take 25 mg by mouth 2 (two) times daily with a meal.   CINNAMON PO Take 1 capsule by mouth daily with breakfast.   clopidogrel 75 MG tablet Commonly known as:  PLAVIX Take 1 tablet (75 mg total) by mouth daily with breakfast.   Dulaglutide 0.75 MG/0.5ML Sopn Commonly known as:  Trulicity Inject 1.61 mg into the skin every Sunday.   FISH OIL PO Take 1 capsule by mouth daily with breakfast.   glucose blood test strip Commonly known as:  ONE TOUCH ULTRA TEST Use to check blood sugar 3 times daily Dx code E11.65 What changed:  Another medication with the same name was changed. Make sure you understand how and when to take each.   OneTouch Verio test strip Generic drug:  glucose blood CHECK BLOOD SUGAR THREE TIMES DAILY What changed:  See the new instructions.   indapamide 2.5 MG tablet Commonly known as:  LOZOL Take 2.5 mg by mouth daily.   lisinopril 40 MG tablet Commonly known as:  PRINIVIL,ZESTRIL Take 1 tablet (40 mg total) by mouth daily.   Multivitamin Gummies Adult Chew Chew 1 tablet by mouth daily with breakfast.   TURMERIC PO Take 1 capsule by mouth daily with breakfast.   Vitamin D3 25 MCG (1000 UT) Caps Take 2,000 Units by mouth daily with breakfast.   VITAMIN E PO Take 1  capsule by mouth daily with breakfast.      Allergies  Allergen Reactions  . Hydralazine Hcl Itching and Other (See Comments)    ENTIRE BODY = burning sensation, also in the eyes (they have become very sensitive to light)  . Atorvastatin Other (See Comments)    Per MD      The results of significant diagnostics from this hospitalization (including imaging, microbiology, ancillary and laboratory) are listed below for reference.    Significant Diagnostic Studies: Dg Chest 2 View  Result Date: 11/27/2018 CLINICAL DATA:  Chest pain EXAM: CHEST - 2 VIEW COMPARISON:  11/10/2013 FINDINGS: Mildly low lung volumes, accentuated by positioning. There is no edema, consolidation, effusion, or pneumothorax. Normal heart size. Negative mediastinal contours. IMPRESSION: Negative chest. Electronically Signed   By: Angelica Chessman  Watts M.D.   On: 11/27/2018 07:08   Vas US Renal Artery Duplex  Result Date: 11/10/2018 ABDOMINAL VISCERAL Indications: Left renal artery stent placed 10/06/2018 Limitations: Air/bowel gas and obesity. Performing Technologist: Alvia Grove RVT  Examination Guidelines: A complete evaluation includes B-mode imaging, spectral Doppler, color Doppler, and power Doppler as needed of all accessible portions of each vessel. Bilateral testing is considered an integral part of a complete examination. Limited examinations for reoccurring indications may be performed as noted.  Duplex Findings: +-----------------+--------+--------+-------+ Left Renal ArteryPSV cm/sEDV cm/sComment +-----------------+--------+--------+-------+ Proximal           224                   +-----------------+--------+--------+-------+ Mid                228                   +-----------------+--------+--------+-------+ Distal              86                   +-----------------+--------+--------+-------+ +------------+--------+--------+--+-----------+--------+--------+---+ Right KidneyPSV cm/sEDV  cm/sRILeft KidneyPSV cm/sEDV cm/sRI  +------------+--------+--------+--+-----------+--------+--------+---+ Upper Pole                    Upper Pole                     +------------+--------+--------+--+-----------+--------+--------+---+ Mid                           Mid                            +------------+--------+--------+--+-----------+--------+--------+---+ Lower Pole                    Lower Pole                     +------------+--------+--------+--+-----------+--------+--------+---+ Hilar                         Hilar                          +------------+--------+--------+--+-----------+--------+--------+---+ +------------------++------------------+-------+ Right Kidney      Left Kidney               +------------------++------------------+-------+ RAR               RAR                       +------------------++------------------+-------+ RAR (manual)      RAR (manual)              +------------------++------------------+-------+ Cortex            Cortex                    +------------------++------------------+-------+ Cortex thickness  Corex thickness   1.52 mm +------------------++------------------+-------+ Kidney length (cm)Kidney length (cm)9.70    +------------------++------------------+-------+  Impression: Renal:  Left: Patent left renal artery with no visualized stenosis. Unable       to visualize stent. Limited visability due to body habitus.  *See table(s) above for measurements and observations.  Diagnosing physician: Servando Snare MD  Electronically signed by Servando Snare MD on 11/10/2018 at 1:04:49 PM.    Final  Microbiology: No results found for this or any previous visit (from the past 240 hour(s)).   Labs: Basic Metabolic Panel: Recent Labs  Lab 11/27/18 0649  NA 139  K 5.0  CL 107  CO2 21*  GLUCOSE 194*  BUN 42*  CREATININE 2.21*  CALCIUM 10.0   Liver Function Tests: No results for  input(s): AST, ALT, ALKPHOS, BILITOT, PROT, ALBUMIN in the last 168 hours. No results for input(s): LIPASE, AMYLASE in the last 168 hours. No results for input(s): AMMONIA in the last 168 hours. CBC: Recent Labs  Lab 11/27/18 0649  WBC 7.9  NEUTROABS 6.4  HGB 9.5*  HCT 31.0*  MCV 91.2  PLT 303   Cardiac Enzymes: Recent Labs  Lab 11/27/18 0649 11/27/18 0939 11/27/18 1540 11/27/18 2134  TROPONINI <0.03 <0.03 <0.03 <0.03   BNP: BNP (last 3 results) No results for input(s): BNP in the last 8760 hours.  ProBNP (last 3 results) No results for input(s): PROBNP in the last 8760 hours.  CBG: Recent Labs  Lab 11/27/18 1115 11/27/18 1638 11/27/18 2110 11/28/18 0549  GLUCAP 122* 122* 127* 118*       Signed:  Radene Gunning NP [facility] 11/28/2018, 9:43 AM

## 2018-11-28 NOTE — Progress Notes (Signed)
Patient called nurses station saying her IV is out. Assessed patient and she had accidentally pulled out her IV while fixing her hair. Patient seen by NP and discussed possible discharge today. Patient request not to have a new IV placed.

## 2018-11-28 NOTE — Progress Notes (Signed)
Reviewed patient discharge instructions with patient. She has been given AVS and Heart Failure booklet. Patient able to teach back to this nurse. Patient to return home via personal vehicle with sister.

## 2018-11-30 ENCOUNTER — Telehealth: Payer: Self-pay

## 2018-11-30 NOTE — Telephone Encounter (Signed)
Virtual Visit Pre-Appointment Phone Call  TELEPHONE CALL NOTE  Teresa Poole has been deemed a candidate for a follow-up tele-health visit to limit community exposure during the Covid-19 pandemic. I spoke with the patient via phone to ensure availability of phone/video source, confirm preferred email & phone number, and discuss instructions and expectations.  I reminded Teresa Poole to be prepared with any vital sign and/or heart rhythm information that could potentially be obtained via home monitoring, at the time of her visit. I reminded Teresa Poole to expect a phone call at the time of her visit if her visit.  Did the patient verbally acknowledge consent to treatment? YES  Patient scheduled for VIDEO Visit via [facility] with Dr. Irish Lack on 4/9  Teresa Gustin, RN 11/30/2018 10:02 AM   DOWNLOADING THE [place], go to [employer] and type in WebEx in the search bar. Teresa Poole, the blue/green circle. The app is free but as with any other app downloads, their phone may require them to verify saved payment information or Apple password. The patient does NOT have to create an account.  - If Android, ask patient to go to [employer] and type in WebEx in the search bar. Teresa Poole, the blue/green circle. The app is free but as with any other app downloads, their phone may require them to verify saved payment information or Android password. The patient does NOT have to create an account.   CONSENT FOR TELE-HEALTH VISIT - PLEASE REVIEW  I hereby voluntarily request, consent and authorize [facility] and its employed or contracted physicians, physician assistants, nurse practitioners or other licensed health care professionals (the Practitioner), to provide me with telemedicine health care services (the "Services") as deemed necessary by the treating Practitioner. I acknowledge and consent to receive  the Services by the Practitioner via telemedicine. I understand that the telemedicine visit will involve communicating with the Practitioner through live audiovisual communication technology and the disclosure of certain medical information by electronic transmission. I acknowledge that I have been given the opportunity to request an in-person assessment or other available alternative prior to the telemedicine visit and am voluntarily participating in the telemedicine visit.  I understand that I have the right to withhold or withdraw my consent to the use of telemedicine in the course of my care at any time, without affecting my right to future care or treatment, and that the Practitioner or I may terminate the telemedicine visit at any time. I understand that I have the right to inspect all information obtained and/or recorded in the course of the telemedicine visit and may receive copies of available information for a reasonable fee.  I understand that some of the potential risks of receiving the Services via telemedicine include:  Marland Kitchen Delay or interruption in medical evaluation due to technological equipment failure or disruption; . Information transmitted may not be sufficient (e.g. poor resolution of images) to allow for appropriate medical decision making by the Practitioner; and/or  . In rare instances, security protocols could fail, causing a breach of personal health information.  Furthermore, I acknowledge that it is my responsibility to provide information about my medical history, conditions and care that is complete and accurate to the best of my ability. I acknowledge that Practitioner's advice, recommendations, and/or decision may be based on factors not within their control, such as incomplete or inaccurate data provided by me or distortions of  diagnostic images or specimens that may result from electronic transmissions. I understand that the practice of medicine is not an exact science and that  Practitioner makes no warranties or guarantees regarding treatment outcomes. I acknowledge that I will receive a copy of this consent concurrently upon execution via email to the email address I last provided but may also request a printed copy by calling the office of [place].    I understand that my insurance will be billed for this visit.   I have read or had this consent read to me. . I understand the contents of this consent, which adequately explains the benefits and risks of the Services being provided via telemedicine.  . I have been provided ample opportunity to ask questions regarding this consent and the Services and have had my questions answered to my satisfaction. . I give my informed consent for the services to be provided through the use of telemedicine in my medical care  By participating in this telemedicine visit I agree to the above.

## 2018-12-01 ENCOUNTER — Encounter: Payer: Self-pay | Admitting: Interventional Cardiology

## 2018-12-01 ENCOUNTER — Other Ambulatory Visit: Payer: Self-pay

## 2018-12-01 ENCOUNTER — Telehealth (INDEPENDENT_AMBULATORY_CARE_PROVIDER_SITE_OTHER): Payer: BC Managed Care – PPO | Admitting: Interventional Cardiology

## 2018-12-01 ENCOUNTER — Ambulatory Visit: Payer: BC Managed Care – PPO | Admitting: Cardiovascular Disease

## 2018-12-01 VITALS — BP 144/66 | HR 77 | Ht 66.0 in | Wt 184.0 lb

## 2018-12-01 DIAGNOSIS — I428 Other cardiomyopathies: Secondary | ICD-10-CM

## 2018-12-01 DIAGNOSIS — G4733 Obstructive sleep apnea (adult) (pediatric): Secondary | ICD-10-CM | POA: Diagnosis not present

## 2018-12-01 DIAGNOSIS — I10 Essential (primary) hypertension: Secondary | ICD-10-CM | POA: Diagnosis not present

## 2018-12-01 DIAGNOSIS — R5383 Other fatigue: Secondary | ICD-10-CM | POA: Diagnosis not present

## 2018-12-01 NOTE — Progress Notes (Signed)
Virtual Visit via Video Note   This visit type was conducted due to national recommendations for restrictions regarding the COVID-19 Pandemic (e.g. social distancing) in an effort to limit this patient's exposure and mitigate transmission in our community.  Due to her co-morbid illnesses, this patient is at least at moderate risk for complications without adequate follow up.  This format is felt to be most appropriate for this patient at this time.  All issues noted in this document were discussed and addressed.  A limited physical exam was performed with this format.  Please refer to the patient's chart for her consent to telehealth for [facility].   Evaluation Performed:  Follow-up visit  Date:  12/01/2018   ID:  Teresa Poole, DOB Oct 24, 1950, MRN [id number]  Patient Location: Home  Provider Location: Home  PCP:  Orpah Melter, MD  Cardiologist:  No primary care provider on file. [place] Electrophysiologist:  None   Chief Complaint:  Chest pain/HTN  History of Present Illness:    Teresa Poole is a 68 y.o. female who presents via audio/video conferencing for a telehealth visit today.    She had a nonischemic cardiomyopathy diagnosed in 2015.  Cath in 2015 showed: Impression: 1. Non-obstructive CAD 2. Non-ischemic cardiomyopathy  EF by echo was 30-35%.   Since 2015, she has lost 50 lbs through diet control. DM control improved.   Renal stent placed in 09/2018.  Initially BP came down, but then went back up more recently.   She was hospitalized in 11/2018 with diffuse pain and increased BP.  She was on steroids at the time which were thought to increase the BP. She had been eating more salt of late due to the quarantine.   BP at home has been in the 400-867Y systolic.   Found to be allergic to hydralazine.   She was diagnosed with OSA in the past but did not get CPAP.  She falls asleep easily during the day.   The patient does not have symptoms concerning for  COVID-19 infection (fever, chills, cough, or new shortness of breath).    Past Medical History:  Diagnosis Date  . AKI (acute kidney injury) ([place]) 09/09/2013  . Allergy   . Altered mental status 01/17/2014  . Anemia   . Bronchitis 09/07/2013  . CAD (coronary artery disease)    non-obstructive by LHC (09/25/2013): Proximal and mid LAD serial 20%, proximal circumflex 30%, mid AV groove circumflex 30%, mid RCA mild plaque.  . Cancer ([place])   . Cardiomyopathy, hypertensive ([place]) 09/18/2013  . Carotid stenosis    a. Carotid US (09/26/2011): Bilateral 1-39% ICA.  Marland Kitchen Chest pain 11/27/2018  . Chronic combined systolic and diastolic CHF (congestive heart failure) ([place])   . CKD (chronic kidney disease)   . CKD (chronic kidney disease), stage III ([place]) 09/24/2013   Pt at [facility], Dr. Joelyn Oms  . Diabetes mellitus without complication ([place])   . Edema 09/14/2013  . GERD (gastroesophageal reflux disease)   . History of ovarian cancer   . Hyperlipidemia   . Hypertension   . Hypertensive urgency 09/18/2013  . NICM (nonischemic cardiomyopathy) ([place])    a. Echocardiogram (09/08/2013): Mild LVH, EF 30-35%, no WMA, mild LAE.  Marland Kitchen Obesity (BMI 30-39.9)   . Other primary cardiomyopathies 10/26/2013  . Renal artery stenosis ([place]) 10/11/2018  . Thyroid disease    Seen by specialist   Past Surgical History:  Procedure Laterality Date  . LEFT HEART CATHETERIZATION WITH CORONARY ANGIOGRAM N/A 09/25/2013  Procedure: LEFT HEART CATHETERIZATION WITH CORONARY ANGIOGRAM;  Surgeon: Burnell Blanks, MD;  Location: [facility] CATH LAB;  Service: Cardiovascular;  Laterality: N/A;  . RENAL ANGIOGRAPHY N/A 10/06/2018   Procedure: RENAL ANGIOGRAPHY;  Surgeon: Marty Heck, MD;  Location: [place] CV LAB;  Service: Cardiovascular;  Laterality: N/A;     Current Meds  Medication Sig  . amLODipine (NORVASC) 10 MG tablet Take 1 tablet (10 mg total) by mouth daily.  . carvedilol (COREG) 25 MG tablet Take 25 mg by mouth 2  (two) times daily with a meal.  . Cholecalciferol (VITAMIN D3) 25 MCG (1000 UT) CAPS Take 2,000 Units by mouth daily with breakfast.  . CINNAMON PO Take 1 capsule by mouth daily with breakfast.  . Dulaglutide (TRULICITY) 7.02 OV/7.8HY SOPN Inject 0.75 mg into the skin every Sunday.  Marland Kitchen glucose blood (ONE TOUCH ULTRA TEST) test strip Use to check blood sugar 3 times daily Dx code E11.65  . indapamide (LOZOL) 2.5 MG tablet Take 2.5 mg by mouth daily.  Marland Kitchen lisinopril (PRINIVIL,ZESTRIL) 40 MG tablet Take 1 tablet (40 mg total) by mouth daily.  . Multiple Vitamins-Minerals (MULTIVITAMIN GUMMIES ADULT) CHEW Chew 1 tablet by mouth daily with breakfast.  . ONETOUCH VERIO test strip CHECK BLOOD SUGAR THREE TIMES DAILY  . TURMERIC PO Take 1 capsule by mouth daily with breakfast.  . VITAMIN E PO Take 1 capsule by mouth daily with breakfast.     Allergies:   Hydralazine hcl and Atorvastatin   Social History   Tobacco Use  . Smoking status: Never Smoker  . Smokeless tobacco: Never Used  Substance Use Topics  . Alcohol use: No  . Drug use: No     Family Hx: The patient's family history includes Diabetes in her father; Hypertension in her father and maternal grandfather.  ROS:   Please see the history of present illness.    Chest pressure a few days agio with high BP readings.  Fatigue.  All other systems reviewed and are negative.   Prior CV studies:   The following studies were reviewed today:  Cath results reviewed  Labs/Other Tests and Data Reviewed:    EKG:  NSR, RBBB, on 11/28/2018  Recent Labs: 05/04/2018: TSH 1.61 09/07/2018: ALT 12 11/27/2018: BUN 42; Creatinine, Ser 2.21; Hemoglobin 9.5; Platelets 303; Potassium 5.0; Sodium 139   Recent Lipid Panel Lab Results  Component Value Date/Time   CHOL 141 05/04/2018 11:19 AM   TRIG 75.0 05/04/2018 11:19 AM   HDL 54.20 05/04/2018 11:19 AM   CHOLHDL 3 05/04/2018 11:19 AM   LDLCALC 72 05/04/2018 11:19 AM    Wt Readings from Last 3  Encounters:  12/01/18 184 lb (83.5 kg)  11/28/18 186 lb 6.4 oz (84.6 kg)  10/11/18 187 lb 9.8 oz (85.1 kg)     Objective:    Vital Signs:  BP (!) 144/66   Pulse 77   Ht 5\' 6"  (1.676 m)   Wt 184 lb (83.5 kg)   BMI 29.70 kg/m    Well nourished, well developed female in no acute distress. No obvious shortness of breath  ASSESSMENT & PLAN:    1. NICM: EF 30-35% in 2015.  Appears euvolemic, based on video exam. 2. HTN: CKD limits adding Aldactone.  She has been eating more canned foods and processed foods since the quarantine.  Cr 2.21 at last check.  Will hold off on adding any new medicine.   3. OSA: Plan for repeat OSA.  May be  impacting BP and causing her fatigue.  4. DM: COntrolled per her report.  Diet has changed during the quarantine.  Walking has decreased as well.   COVID-19 Education: The signs and symptoms of COVID-19 were discussed with the patient and how to seek care for testing (follow up with PCP or arrange E-visit).  The importance of social distancing was discussed today.  Time:   Today, I have spent 25 minutes with the patient with telehealth technology discussing the above problems.     Medication Adjustments/Labs and Tests Ordered: Current medicines are reviewed at length with the patient today.  Concerns regarding medicines are outlined above.  Tests Ordered: No orders of the defined types were placed in this encounter.  Medication Changes: No orders of the defined types were placed in this encounter.   Disposition:  Follow up in 2 week(s)  Signed, Larae Grooms, MD  12/01/2018 10:59 AM    [employer]

## 2018-12-01 NOTE — Patient Instructions (Addendum)
Medication Instructions:  Your physician recommends that you continue on your current medications as directed. Please refer to the Current Medication list given to you today.  If you need a refill on your cardiac medications before your next appointment, please call your pharmacy.   Lab work: None Ordered  If you have labs (blood work) drawn today and your tests are completely normal, you will receive your results only by: Marland Kitchen MyChart Message (if you have MyChart) OR . A paper copy in the mail If you have any lab test that is abnormal or we need to change your treatment, we will call you to review the results.  Testing/Procedures: Your physician has recommended that you have a sleep study. This test records several body functions during sleep, including: brain activity, eye movement, oxygen and carbon dioxide blood levels, heart rate and rhythm, breathing rate and rhythm, the flow of air through your mouth and nose, snoring, body muscle movements, and chest and belly movement.  Follow-Up: Follow up with Dr. Irish Lack via VIDEO Visit on 12/15/18 at 2:40 PM  Any Other Special Instructions Will Be Listed Below (If Applicable).  1. Decrease the amount of salt in your diet to less than 2,00 mg of sodium daily  2. Increase walking with 6 feet separation

## 2018-12-06 ENCOUNTER — Other Ambulatory Visit: Payer: Self-pay

## 2018-12-06 ENCOUNTER — Other Ambulatory Visit (INDEPENDENT_AMBULATORY_CARE_PROVIDER_SITE_OTHER): Payer: BC Managed Care – PPO

## 2018-12-06 DIAGNOSIS — E1169 Type 2 diabetes mellitus with other specified complication: Secondary | ICD-10-CM | POA: Diagnosis not present

## 2018-12-06 DIAGNOSIS — E669 Obesity, unspecified: Secondary | ICD-10-CM | POA: Diagnosis not present

## 2018-12-06 LAB — BASIC METABOLIC PANEL
BUN: 37 mg/dL — ABNORMAL HIGH (ref 6–23)
CO2: 26 mEq/L (ref 19–32)
Calcium: 9.5 mg/dL (ref 8.4–10.5)
Chloride: 104 mEq/L (ref 96–112)
Creatinine, Ser: 2.08 mg/dL — ABNORMAL HIGH (ref 0.40–1.20)
GFR: 28.75 mL/min — ABNORMAL LOW (ref >60.00)
Glucose, Bld: 130 mg/dL — ABNORMAL HIGH (ref 70–99)
Potassium: 4.9 mEq/L (ref 3.5–5.1)
Sodium: 139 mEq/L (ref 135–145)

## 2018-12-06 LAB — HEMOGLOBIN A1C: Hgb A1c MFr Bld: 6.8 % — ABNORMAL HIGH (ref 4.6–6.5)

## 2018-12-09 ENCOUNTER — Other Ambulatory Visit: Payer: Self-pay

## 2018-12-09 ENCOUNTER — Encounter: Payer: Self-pay | Admitting: Endocrinology

## 2018-12-09 ENCOUNTER — Ambulatory Visit (INDEPENDENT_AMBULATORY_CARE_PROVIDER_SITE_OTHER): Payer: BC Managed Care – PPO | Admitting: Endocrinology

## 2018-12-09 VITALS — Wt 184.0 lb

## 2018-12-09 DIAGNOSIS — E669 Obesity, unspecified: Secondary | ICD-10-CM | POA: Diagnosis not present

## 2018-12-09 DIAGNOSIS — E559 Vitamin D deficiency, unspecified: Secondary | ICD-10-CM | POA: Diagnosis not present

## 2018-12-09 DIAGNOSIS — E1169 Type 2 diabetes mellitus with other specified complication: Secondary | ICD-10-CM

## 2018-12-09 NOTE — Progress Notes (Signed)
Patient ID: LOVEAH LIKE, female   DOB: 1950/10/08, 68 y.o.   MRN: [id number]           Today's office visit was provided via telemedicine using video technique Explained to the patient and the the limitations of evaluation and management by telemedicine and the availability of in person appointments.  The patient understood the limitations and agreed to proceed. Patient also understood that the telehealth visit is billable. . Location of the patient: Home . Location of the provider: Office Only the patient and myself were participating in the encounter    Reason for Appointment: Follow-up for Type 2 Diabetes  Referring physician: Fulp  History of Present Illness:          Diagnosis: Type 2 diabetes mellitus, date of diagnosis: 2000        Past history: At diagnosis she was having fatigue and she was initially treated with metformin Metformin was stopped about 1-1/2 years ago when she decided not to take any medications. She was in the hospital in 08/2013 and her blood sugar was about 460 with A1c of 15. She was started on Lantus and NovoLog and has been continued on this regimen since then. Has not been back on any oral hypoglycemic drugs in the past year  Recent history:   Non-insulin hypoglycemic drugs the patient is taking are:  Trulicity 6.38 mg weekly  On her initial consultation she was started on Trulicity to help with her postprandial readings and she was able to stop insulin with this  Her A1c is the above same at 6.8, previously 6.6   Current management, blood sugar patterns and problems identified:  She has checked her sugars a little more frequently recently  She thinks her blood sugars were high when she was hospitalized for coronary syndrome a couple of weeks ago but they are looking better now  She does try to check readings AFTER her evening meal and recently these have been excellent with POSTPRANDIAL range 97-134  Fasting readings are recently also  excellent between 105 and 138  She is no longer on metformin because of renal dysfunction  No nausea or vomiting with Trulicity  Her exercise regimen has been minimal although is starting to do just a little walking around the neighborhood recently with approval from her cardiologist      Side effects from medications have been: GI distress with over 1000 mg metformin, Trulicity 1.5 mg causes decreased appetite Compliance with the medical regimen: Fair  Glucose monitoring: Using One Touch Verio monitor  Blood sugars as above   Self-care:      Meals:  2-3 meals per day. Breakfast is meat, egg, potatoes and sometimes oatmeal/fruit.                 Dietician visit, most recent: 3/16.               Weight history: Previous range 205-245  Wt Readings from Last 3 Encounters:  12/09/18 184 lb (83.5 kg)  12/01/18 184 lb (83.5 kg)  11/28/18 186 lb 6.4 oz (84.6 kg)    Glycemic control:    Lab Results  Component Value Date   HGBA1C 6.8 (H) 12/06/2018   HGBA1C 6.6 (H) 09/07/2018   HGBA1C 6.6 (H) 05/04/2018   Lab Results  Component Value Date   MICROALBUR 89.3 (H) 01/21/2018   LDLCALC 72 05/04/2018   CREATININE 2.08 (H) 12/06/2018     Lab on 12/06/2018  Component Date Value Ref Range Status  .  Sodium 12/06/2018 139  135 - 145 mEq/L Final  . Potassium 12/06/2018 4.9  3.5 - 5.1 mEq/L Final  . Chloride 12/06/2018 104  96 - 112 mEq/L Final  . CO2 12/06/2018 26  19 - 32 mEq/L Final  . Glucose, Bld 12/06/2018 130* 70 - 99 mg/dL Final  . BUN 12/06/2018 37* 6 - 23 mg/dL Final  . Creatinine, Ser 12/06/2018 2.08* 0.40 - 1.20 mg/dL Final  . Calcium 12/06/2018 9.5  8.4 - 10.5 mg/dL Final  . GFR 12/06/2018 28.75* >60.00 mL/min Final  . Hgb A1c MFr Bld 12/06/2018 6.8* 4.6 - 6.5 % Final   Glycemic Control Guidelines for People with Diabetes:Non Diabetic:  <6%Goal of Therapy: <7%Additional Action Suggested:  >8%      Allergies as of 12/09/2018      Reactions   Hydralazine Hcl  Itching, Other (See Comments)   ENTIRE BODY = burning sensation, also in the eyes (they have become very sensitive to light)   Atorvastatin Other (See Comments)   Per MD      Medication List       Accurate as of December 09, 2018  1:25 PM. Always use your most recent med list.        amLODipine 10 MG tablet Commonly known as:  NORVASC Take 1 tablet (10 mg total) by mouth daily.   carvedilol 25 MG tablet Commonly known as:  COREG Take 25 mg by mouth 2 (two) times daily with a meal.   CINNAMON PO Take 1 capsule by mouth daily with breakfast.   Dulaglutide 0.75 MG/0.5ML Sopn Commonly known as:  Trulicity Inject 6.72 mg into the skin every Sunday.   glucose blood test strip Commonly known as:  ONE TOUCH ULTRA TEST Use to check blood sugar 3 times daily Dx code E11.65   OneTouch Verio test strip Generic drug:  glucose blood CHECK BLOOD SUGAR THREE TIMES DAILY   indapamide 2.5 MG tablet Commonly known as:  LOZOL Take 2.5 mg by mouth daily.   lisinopril 40 MG tablet Commonly known as:  ZESTRIL Take 1 tablet (40 mg total) by mouth daily.   Multivitamin Gummies Adult Chew Chew 1 tablet by mouth daily with breakfast.   TURMERIC PO Take 1 capsule by mouth daily with breakfast.   Vitamin D3 25 MCG (1000 UT) Caps Take 2,000 Units by mouth daily with breakfast.   VITAMIN E PO Take 1 capsule by mouth daily with breakfast.       Allergies:  Allergies  Allergen Reactions  . Hydralazine Hcl Itching and Other (See Comments)    ENTIRE BODY = burning sensation, also in the eyes (they have become very sensitive to light)  . Atorvastatin Other (See Comments)    Per MD    Past Medical History:  Diagnosis Date  . AKI (acute kidney injury) ([place]) 09/09/2013  . Allergy   . Altered mental status 01/17/2014  . Anemia   . Bronchitis 09/07/2013  . CAD (coronary artery disease)    non-obstructive by LHC (09/25/2013): Proximal and mid LAD serial 20%, proximal circumflex 30%, mid AV  groove circumflex 30%, mid RCA mild plaque.  . Cancer ([place])   . Cardiomyopathy, hypertensive ([place]) 09/18/2013  . Carotid stenosis    a. Carotid US (09/26/2011): Bilateral 1-39% ICA.  Marland Kitchen Chest pain 11/27/2018  . Chronic combined systolic and diastolic CHF (congestive heart failure) ([place])   . CKD (chronic kidney disease)   . CKD (chronic kidney disease), stage III ([place]) 09/24/2013  Pt at [facility], Dr. Joelyn Oms  . Diabetes mellitus without complication ([place])   . Edema 09/14/2013  . GERD (gastroesophageal reflux disease)   . History of ovarian cancer   . Hyperlipidemia   . Hypertension   . Hypertensive urgency 09/18/2013  . NICM (nonischemic cardiomyopathy) ([place])    a. Echocardiogram (09/08/2013): Mild LVH, EF 30-35%, no WMA, mild LAE.  Marland Kitchen Obesity (BMI 30-39.9)   . Other primary cardiomyopathies 10/26/2013  . Renal artery stenosis ([place]) 10/11/2018  . Thyroid disease    Seen by specialist    Past Surgical History:  Procedure Laterality Date  . LEFT HEART CATHETERIZATION WITH CORONARY ANGIOGRAM N/A 09/25/2013   Procedure: LEFT HEART CATHETERIZATION WITH CORONARY ANGIOGRAM;  Surgeon: Burnell Blanks, MD;  [facility];  Service: Cardiovascular;  Laterality: N/A;  . RENAL ANGIOGRAPHY N/A 10/06/2018   Procedure: RENAL ANGIOGRAPHY;  Surgeon: Marty Heck, MD;  Location: [facility] CV LAB;  Service: Cardiovascular;  Laterality: N/A;    Family History  Problem Relation Age of Onset  . Diabetes Father   . Hypertension Father   . Hypertension Maternal Grandfather     Social History:  reports that she has never smoked. She has never used smokeless tobacco. She reports that she does not drink alcohol or use drugs.    Review of Systems    Thyroid: She has a 2.8 cm mixed echogenic predominantly solid nodule on the right side and a biopsy was benign   Thyroid levels have been normal  Lab Results  Component Value Date   TSH 1.61 05/04/2018   TSH 1.66 12/21/2016   TSH  1.07 12/20/2014          Lipids:  has been treated with Simvastatin 40 mg by PCP with good control       Lab Results  Component Value Date   CHOL 141 05/04/2018   HDL 54.20 05/04/2018   LDLCALC 72 05/04/2018   TRIG 75.0 05/04/2018   CHOLHDL 3 05/04/2018                   The blood pressure has been treated with carvedilol, amlodipine 10 mg and lisinopril 40 mg. Periodically seen by nephrologist   Renal artery ultrasound done in 2016 showed the following: Findings worrisome for a potential hemodynamically significant stenosis involving the origin and mid aspects of the left renal artery. She now has had a vascular stent placed  BP Readings from Last 3 Encounters:  12/01/18 (!) 144/66  11/28/18 (!) 150/67  10/11/18 (!) 159/65   RENAL dysfunction somewhat better now   Lab Results  Component Value Date   CREATININE 2.08 (H) 12/06/2018   CREATININE 2.21 (H) 11/27/2018   CREATININE 2.33 (H) 10/07/2018    Previous vitamin D level has been low, on vitamin D3, 2000 units daily  Lab Results  Component Value Date   VD25OH 34.89 01/21/2018   VD25OH 21.69 (L) 12/21/2016   Last foot exam: 08/2018   LABS:  Lab on 12/06/2018  Component Date Value Ref Range Status  . Sodium 12/06/2018 139  135 - 145 mEq/L Final  . Potassium 12/06/2018 4.9  3.5 - 5.1 mEq/L Final  . Chloride 12/06/2018 104  96 - 112 mEq/L Final  . CO2 12/06/2018 26  19 - 32 mEq/L Final  . Glucose, Bld 12/06/2018 130* 70 - 99 mg/dL Final  . BUN 12/06/2018 37* 6 - 23 mg/dL Final  . Creatinine, Ser 12/06/2018 2.08* 0.40 - 1.20 mg/dL  Final  . Calcium 12/06/2018 9.5  8.4 - 10.5 mg/dL Final  . GFR 12/06/2018 28.75* >60.00 mL/min Final  . Hgb A1c MFr Bld 12/06/2018 6.8* 4.6 - 6.5 % Final   Glycemic Control Guidelines for People with Diabetes:Non Diabetic:  <6%Goal of Therapy: <7%Additional Action Suggested:  >8%     Physical Examination:  Wt 184 lb (83.5 kg) Comment: reported by patient.  BMI 29.70 kg/m       ASSESSMENT:  Diabetes type 2, non-insulin-dependent See history of present illness for discussion of current diabetes management, blood sugar patterns and problems identified  Her A1c is still fairly good at 6.8  Currently on only 0.75 mg weekly dose of Trulicity  She has checked her sugars more often and these are looking fairly good although only recent readings reviewed Highest reading at home 143 this month Also she has started doing more readings after meals Also has been trying to do a little walking in her neighborhood although probably not enough Weight is stable and she is tolerating Trulicity  HYPERTENSION, renal dysfunction and history of renovascular disease: Blood pressure is better    PLAN:   She will continue 2.99 mg Trulicity alone Discussed blood sugar targets, need for consistent efforts to lose weight  Hypertension and CKD management: Stable and followed by nephrologist  Recheck vitamin D level on the next visit  Check lipids on next visit, currently not on any treatment  There are no Patient Instructions on file for this visit.    12/09/2018, 1:25 PM   Note: This office note was prepared with Estate agent. Any transcriptional errors that result from this process are unintentional.

## 2018-12-13 ENCOUNTER — Other Ambulatory Visit: Payer: Self-pay | Admitting: Physician Assistant

## 2018-12-13 ENCOUNTER — Other Ambulatory Visit: Payer: Self-pay

## 2018-12-13 ENCOUNTER — Ambulatory Visit
Admission: RE | Admit: 2018-12-13 | Discharge: 2018-12-13 | Disposition: A | Discharge status: Home / Self Care | Payer: BC Managed Care – PPO | Source: Ambulatory Visit | Attending: Physician Assistant | Admitting: Physician Assistant

## 2018-12-13 DIAGNOSIS — M545 Low back pain, unspecified: Secondary | ICD-10-CM

## 2018-12-15 ENCOUNTER — Encounter: Payer: Self-pay | Admitting: Interventional Cardiology

## 2018-12-15 ENCOUNTER — Other Ambulatory Visit: Payer: Self-pay

## 2018-12-15 ENCOUNTER — Telehealth (INDEPENDENT_AMBULATORY_CARE_PROVIDER_SITE_OTHER): Payer: BC Managed Care – PPO | Admitting: Interventional Cardiology

## 2018-12-15 DIAGNOSIS — I110 Hypertensive heart disease with heart failure: Secondary | ICD-10-CM

## 2018-12-15 DIAGNOSIS — E119 Type 2 diabetes mellitus without complications: Secondary | ICD-10-CM | POA: Diagnosis not present

## 2018-12-15 DIAGNOSIS — I5032 Chronic diastolic (congestive) heart failure: Secondary | ICD-10-CM

## 2018-12-15 NOTE — Patient Instructions (Signed)

## 2018-12-15 NOTE — Progress Notes (Signed)
Virtual Visit via Video Note   This visit type was conducted due to national recommendations for restrictions regarding the COVID-19 Pandemic (e.g. social distancing) in an effort to limit this patient's exposure and mitigate transmission in our community.  Due to her co-morbid illnesses, this patient is at least at moderate risk for complications without adequate follow up.  This format is felt to be most appropriate for this patient at this time.  All issues noted in this document were discussed and addressed.  A limited physical exam was performed with this format.  Please refer to the patient's chart for her consent to telehealth for [facility].   Evaluation Performed:  Follow-up visit  Date:  12/15/2018   ID:  Teresa, Poole 1951-03-01, MRN [id number]  Patient Location: Home Provider Location: Home  PCP:  Orpah Melter, MD  Cardiologist:  No primary care provider on file. [place] Electrophysiologist:  None   Chief Complaint:  HTN  History of Present Illness:    Teresa Poole is a 68 y.o. female with  nonischemic cardiomyopathy diagnosed in 2015.  Cath in 2015 showed: Impression: 1. Non-obstructive CAD 2. Non-ischemic cardiomyopathy  EF by echo was 30-35%.   Since 2015, she has lost 50 lbs through diet control. DM control improved.   Renal stent placed in 09/2018.  Initially BP came down, but then went back up more recently.   She was hospitalized in 11/2018 with diffuse pain and increased BP.  She was on steroids at the time which were thought to increase the BP. She had been eating more salt of late due to the quarantine.   BP at home was in the 626-948N systolic.   Found to be allergic to hydralazine.   She cut back on salt intake after her last televisit.  Her BP Is much better, running in the 462-703 range systolic.  She is eating more fresh vegetables.  She is walking more as well.    She was diagnosed with OSA in the past but did not get  CPAP.    Energy level has improved since she made the dietary changes.   Denies : Chest pain. Dizziness. Leg edema. Nitroglycerin use. Orthopnea. Palpitations. Paroxysmal nocturnal dyspnea. Shortness of breath. Syncope.   The patient does not have symptoms concerning for COVID-19 infection (fever, chills, cough, or new shortness of breath).    Past Medical History:  Diagnosis Date  . AKI (acute kidney injury) ([place]) 09/09/2013  . Allergy   . Altered mental status 01/17/2014  . Anemia   . Bronchitis 09/07/2013  . CAD (coronary artery disease)    non-obstructive by LHC (09/25/2013): Proximal and mid LAD serial 20%, proximal circumflex 30%, mid AV groove circumflex 30%, mid RCA mild plaque.  . Cancer ([place])   . Cardiomyopathy, hypertensive ([place]) 09/18/2013  . Carotid stenosis    a. Carotid US (09/26/2011): Bilateral 1-39% ICA.  Marland Kitchen Chest pain 11/27/2018  . Chronic combined systolic and diastolic CHF (congestive heart failure) ([place])   . CKD (chronic kidney disease)   . CKD (chronic kidney disease), stage III ([place]) 09/24/2013   [facility], Dr. Joelyn Oms  . Diabetes mellitus without complication ([place])   . Edema 09/14/2013  . GERD (gastroesophageal reflux disease)   . History of ovarian cancer   . Hyperlipidemia   . Hypertension   . Hypertensive urgency 09/18/2013  . NICM (nonischemic cardiomyopathy) ([place])    a. Echocardiogram (09/08/2013): Mild LVH, EF 30-35%, no WMA, mild LAE.  Marland Kitchen  Obesity (BMI 30-39.9)   . Other primary cardiomyopathies 10/26/2013  . Renal artery stenosis ([place]) 10/11/2018  . Thyroid disease    Seen by specialist   Past Surgical History:  Procedure Laterality Date  . LEFT HEART CATHETERIZATION WITH CORONARY ANGIOGRAM N/A 09/25/2013   Procedure: LEFT HEART CATHETERIZATION WITH CORONARY ANGIOGRAM;  Surgeon: Burnell Blanks, MD;  Location: [facility] C[facility] LAB;  Service: Cardiovascular;  Laterality: N/A;  . RENAL ANGIOGRAPHY N/A 10/06/2018   Procedure: RENAL ANGIOGRAPHY;  Surgeon:  Marty Heck, MD;  Location: [facility] CV LAB;  Service: Cardiovascular;  Laterality: N/A;     Current Meds  Medication Sig  . amLODipine (NORVASC) 10 MG tablet Take 1 tablet (10 mg total) by mouth daily.  . carvedilol (COREG) 25 MG tablet Take 25 mg by mouth 2 (two) times daily with a meal.  . Cholecalciferol (VITAMIN D3) 25 MCG (1000 UT) CAPS Take 2,000 Units by mouth daily with breakfast.  . CINNAMON PO Take 1 capsule by mouth daily with breakfast.  . Dulaglutide (TRULICITY) 6.96 EX/5.2WU SOPN Inject 0.75 mg into the skin every Sunday.  Marland Kitchen glucose blood (ONE TOUCH ULTRA TEST) test strip Use to check blood sugar 3 times daily Dx code E11.65  . indapamide (LOZOL) 2.5 MG tablet Take 2.5 mg by mouth daily.  Marland Kitchen lisinopril (PRINIVIL,ZESTRIL) 40 MG tablet Take 1 tablet (40 mg total) by mouth daily.  . Multiple Vitamins-Minerals (MULTIVITAMIN GUMMIES ADULT) CHEW Chew 1 tablet by mouth daily with breakfast.  . ONETOUCH VERIO test strip CHECK BLOOD SUGAR THREE TIMES DAILY  . TURMERIC PO Take 1 capsule by mouth daily with breakfast.  . VITAMIN E PO Take 1 capsule by mouth daily with breakfast.     Allergies:   Hydralazine hcl and Atorvastatin   Social History   Tobacco Use  . Smoking status: Never Smoker  . Smokeless tobacco: Never Used  Substance Use Topics  . Alcohol use: No  . Drug use: No     Family Hx: The patient's family history includes Diabetes in her father; Hypertension in her father and maternal grandfather.  ROS:   Please see the history of present illness.    Energy level has improved. All other systems reviewed and are negative.   Prior CV studies:   The following studies were reviewed today:  As noted  Labs/Other Tests and Data Reviewed:    EKG:  No ECG reviewed.  Recent Labs: 05/04/2018: TSH 1.61 09/07/2018: ALT 12 11/27/2018: Hemoglobin 9.5; Platelets 303 12/06/2018: BUN 37; Creatinine, Ser 2.08; Potassium 4.9; Sodium 139   Recent Lipid Panel Lab  Results  Component Value Date/Time   CHOL 141 05/04/2018 11:19 AM   TRIG 75.0 05/04/2018 11:19 AM   HDL 54.20 05/04/2018 11:19 AM   CHOLHDL 3 05/04/2018 11:19 AM   LDLCALC 72 05/04/2018 11:19 AM    Wt Readings from Last 3 Encounters:  12/15/18 183 lb (83 kg)  12/09/18 184 lb (83.5 kg)  12/01/18 184 lb (83.5 kg)     Objective:    Vital Signs:  BP 121/80   Pulse 72   Ht 5\' 6"  (1.676 m)   Wt 183 lb (83 kg)   BMI 29.54 kg/m    VITAL SIGNS:  reviewed GEN:  no acute distress RESPIRATORY:  normal respiratory effort, symmetric expansion PSYCH:  normal affect exam limited by video ormat  ASSESSMENT & PLAN:    1. HTN: Improved with lifestyle changes.  COntinue current meds.  2. NICM: Appears euvolemic.  EF resolved.  Chronic diastolic heart failure- well controlled.  3. Fatigue: Improved.  OSA sx resolved as BP came down. 4. DM: Controlled.  COntinue healthy lifestyle. A1C 6.8  COVID-19 Education: The signs and symptoms of COVID-19 were discussed with the patient and how to seek care for testing (follow up with PCP or arrange E-visit).  The importance of social distancing was discussed today.  Time:   Today, I have spent  minutes with the patient with telehealth technology discussing the above problems.     Medication Adjustments/Labs and Tests Ordered: Current medicines are reviewed at length with the patient today.  Concerns regarding medicines are outlined above.   Tests Ordered: No orders of the defined types were placed in this encounter.   Medication Changes: No orders of the defined types were placed in this encounter.   Disposition:  Follow up in 6 month(s)  Signed, Larae Grooms, MD  12/15/2018 3:32 PM    [place]

## 2019-03-05 ENCOUNTER — Other Ambulatory Visit: Payer: Self-pay | Admitting: Endocrinology

## 2019-04-12 ENCOUNTER — Other Ambulatory Visit (INDEPENDENT_AMBULATORY_CARE_PROVIDER_SITE_OTHER): Payer: Medicare Other

## 2019-04-12 ENCOUNTER — Other Ambulatory Visit: Payer: Self-pay

## 2019-04-12 DIAGNOSIS — E559 Vitamin D deficiency, unspecified: Secondary | ICD-10-CM | POA: Diagnosis not present

## 2019-04-12 DIAGNOSIS — E1169 Type 2 diabetes mellitus with other specified complication: Secondary | ICD-10-CM

## 2019-04-12 DIAGNOSIS — E669 Obesity, unspecified: Secondary | ICD-10-CM | POA: Diagnosis not present

## 2019-04-12 LAB — COMPREHENSIVE METABOLIC PANEL
ALT: 12 U/L (ref 0–35)
AST: 17 U/L (ref 0–37)
Albumin: 3.8 g/dL (ref 3.5–5.2)
Alkaline Phosphatase: 37 U/L — ABNORMAL LOW (ref 39–117)
BUN: 43 mg/dL — ABNORMAL HIGH (ref 6–23)
CO2: 27 mEq/L (ref 19–32)
Calcium: 9.3 mg/dL (ref 8.4–10.5)
Chloride: 105 mEq/L (ref 96–112)
Creatinine, Ser: 2.51 mg/dL — ABNORMAL HIGH (ref 0.40–1.20)
GFR: 23.12 mL/min — ABNORMAL LOW (ref >60.00)
Glucose, Bld: 111 mg/dL — ABNORMAL HIGH (ref 70–99)
Potassium: 4.4 mEq/L (ref 3.5–5.1)
Sodium: 138 mEq/L (ref 135–145)
Total Bilirubin: 0.3 mg/dL (ref 0.2–1.2)
Total Protein: 6.9 g/dL (ref 6.0–8.3)

## 2019-04-12 LAB — VITAMIN D 25 HYDROXY (VIT D DEFICIENCY, FRACTURES): VITD: 40.97 ng/mL (ref 30.00–100.00)

## 2019-04-12 LAB — LIPID PANEL
Cholesterol: 259 mg/dL — ABNORMAL HIGH (ref 0–200)
HDL: 56.8 mg/dL (ref >39.00)
LDL Cholesterol: 181 mg/dL — ABNORMAL HIGH (ref 0–99)
NonHDL: 201.7
Total CHOL/HDL Ratio: 5
Triglycerides: 105 mg/dL (ref 0.0–149.0)
VLDL: 21 mg/dL (ref 0.0–40.0)

## 2019-04-12 LAB — HEMOGLOBIN A1C: Hgb A1c MFr Bld: 6.5 % (ref 4.6–6.5)

## 2019-04-14 ENCOUNTER — Encounter: Payer: Self-pay | Admitting: Endocrinology

## 2019-04-14 ENCOUNTER — Ambulatory Visit (INDEPENDENT_AMBULATORY_CARE_PROVIDER_SITE_OTHER): Payer: Medicare Other | Admitting: Endocrinology

## 2019-04-14 ENCOUNTER — Other Ambulatory Visit: Payer: Self-pay

## 2019-04-14 DIAGNOSIS — E782 Mixed hyperlipidemia: Secondary | ICD-10-CM | POA: Diagnosis not present

## 2019-04-14 DIAGNOSIS — I701 Atherosclerosis of renal artery: Secondary | ICD-10-CM

## 2019-04-14 DIAGNOSIS — E669 Obesity, unspecified: Secondary | ICD-10-CM | POA: Diagnosis not present

## 2019-04-14 DIAGNOSIS — N184 Chronic kidney disease, stage 4 (severe): Secondary | ICD-10-CM | POA: Diagnosis not present

## 2019-04-14 DIAGNOSIS — E1169 Type 2 diabetes mellitus with other specified complication: Secondary | ICD-10-CM

## 2019-04-14 MED ORDER — ROSUVASTATIN CALCIUM 10 MG PO TABS: 10.0000 mg | ORAL_TABLET | Freq: Every day | ORAL | 3 refills | Status: DC

## 2019-04-14 NOTE — Progress Notes (Signed)
Patient ID: Teresa Poole, female   DOB: Mar 05, 1951, 69 y.o.   MRN: [id number]           Today's office visit was provided via telemedicine using video technique Explained to the patient and the the limitations of evaluation and management by telemedicine and the availability of in person appointments.  The patient understood the limitations and agreed to proceed. Patient also understood that the telehealth visit is billable. . Location of the patient: Home . Location of the provider: Office Only the patient and myself were participating in the encounter    Reason for Appointment: Follow-up for Type 2 Diabetes   History of Present Illness:          Diagnosis: Type 2 diabetes mellitus, date of diagnosis: 2000        Past history: At diagnosis she was having fatigue and she was initially treated with metformin Metformin was stopped about 1-1/2 years ago when she decided not to take any medications. She was in the hospital in 08/2013 and her blood sugar was about 460 with A1c of 15. She was started on Lantus and NovoLog and has been continued on this regimen since then. Has not been back on any oral hypoglycemic drugs in the past year  Recent history:   Non-insulin hypoglycemic drugs the patient is taking are:  Trulicity 1.77 mg weekly  On her initial consultation she was started on Trulicity to help with her postprandial readings and she was able to stop insulin with this  Her A1c is relatively better at 6.5    Current management, blood sugar patterns and problems identified:  She has not checked her glucose much at all and has only 5 readings in the last month  These are ranging from 82-149 and mostly being checked midday or afternoon  She tries to do a little walking up to 30 minutes at home park but not regularly  She thinks her weight is up 5 pounds, likely to be from inconsistent diet  She will have some heartburn at times but no nausea from taking Trulicity 9.39       Side effects from medications have been: GI distress with over 1000 mg metformin, Trulicity 1.5 mg causes decreased appetite Compliance with the medical regimen: Fair  Glucose monitoring: Using One Touch Verio monitor  Blood sugars as above   Self-care:      Meals:  2-3 meals per day. Breakfast is meat, egg, potatoes and sometimes oatmeal/fruit.                 Dietician visit, most recent: 3/16.               Weight history: Previous range 205-245  Wt Readings from Last 3 Encounters:  12/15/18 183 lb (83 kg)  12/09/18 184 lb (83.5 kg)  12/01/18 184 lb (83.5 kg)    Glycemic control:    Lab Results  Component Value Date   HGBA1C 6.5 04/12/2019   HGBA1C 6.8 (H) 12/06/2018   HGBA1C 6.6 (H) 09/07/2018   Lab Results  Component Value Date   MICROALBUR 89.3 (H) 01/21/2018   LDLCALC 181 (H) 04/12/2019   CREATININE 2.51 (H) 04/12/2019     Lab on 04/12/2019  Component Date Value Ref Range Status  . Cholesterol 04/12/2019 259* 0 - 200 mg/dL Final   ATP III Classification       Desirable:  < 200 mg/dL  Borderline High:  200 - 239 mg/dL          High:  > = 240 mg/dL  . Triglycerides 04/12/2019 105.0  0.0 - 149.0 mg/dL Final   Normal:  <150 mg/dLBorderline High:  150 - 199 mg/dL  . HDL 04/12/2019 56.80  >39.00 mg/dL Final  . VLDL 04/12/2019 21.0  0.0 - 40.0 mg/dL Final  . LDL Cholesterol 04/12/2019 181* 0 - 99 mg/dL Final  . Total CHOL/HDL Ratio 04/12/2019 5   Final                  Men          Women1/2 Average Risk     3.4          3.3Average Risk          5.0          4.42X Average Risk          9.6          7.13X Average Risk          15.0          11.0                      . NonHDL 04/12/2019 201.70   Final   NOTE:  Non-HDL goal should be 30 mg/dL higher than patient's LDL goal (i.e. LDL goal of < 70 mg/dL, would have non-HDL goal of < 100 mg/dL)  . VITD 04/12/2019 40.97  30.00 - 100.00 ng/mL Final  . Sodium 04/12/2019 138  135 - 145 mEq/L Final  .  Potassium 04/12/2019 4.4  3.5 - 5.1 mEq/L Final  . Chloride 04/12/2019 105  96 - 112 mEq/L Final  . CO2 04/12/2019 27  19 - 32 mEq/L Final  . Glucose, Bld 04/12/2019 111* 70 - 99 mg/dL Final  . BUN 04/12/2019 43* 6 - 23 mg/dL Final  . Creatinine, Ser 04/12/2019 2.51* 0.40 - 1.20 mg/dL Final  . Total Bilirubin 04/12/2019 0.3  0.2 - 1.2 mg/dL Final  . Alkaline Phosphatase 04/12/2019 37* 39 - 117 U/L Final  . AST 04/12/2019 17  0 - 37 U/L Final  . ALT 04/12/2019 12  0 - 35 U/L Final  . Total Protein 04/12/2019 6.9  6.0 - 8.3 g/dL Final  . Albumin 04/12/2019 3.8  3.5 - 5.2 g/dL Final  . Calcium 04/12/2019 9.3  8.4 - 10.5 mg/dL Final  . GFR 04/12/2019 23.12* >60.00 mL/min Final  . Hgb A1c MFr Bld 04/12/2019 6.5  4.6 - 6.5 % Final   Glycemic Control Guidelines for People with Diabetes:Non Diabetic:  <6%Goal of Therapy: <7%Additional Action Suggested:  >8%      Allergies as of 04/14/2019      Reactions   Hydralazine Hcl Itching, Other (See Comments)   ENTIRE BODY = burning sensation, also in the eyes (they have become very sensitive to light)   Atorvastatin Other (See Comments)   Per MD      Medication List       Accurate as of April 14, 2019  1:31 PM. If you have any questions, ask your nurse or doctor.        amLODipine 10 MG tablet Commonly known as: NORVASC Take 1 tablet (10 mg total) by mouth daily.   carvedilol 25 MG tablet Commonly known as: COREG Take 25 mg by mouth 2 (two) times daily with a meal.   CINNAMON PO Take 1 capsule by mouth  daily with breakfast.   glucose blood test strip Commonly known as: ONE TOUCH ULTRA TEST Use to check blood sugar 3 times daily Dx code E11.65   OneTouch Verio test strip Generic drug: glucose blood CHECK BLOOD SUGAR THREE TIMES DAILY   indapamide 2.5 MG tablet Commonly known as: LOZOL Take 2.5 mg by mouth daily.   lisinopril 40 MG tablet Commonly known as: ZESTRIL Take 1 tablet (40 mg total) by mouth daily.   Multivitamin  Gummies Adult Chew Chew 1 tablet by mouth daily with breakfast.   Trulicity 6.76 HM/0.9OB Sopn Generic drug: Dulaglutide INJECT 0.75MG  INTO THE SKIN EVERY SUNDAY   TURMERIC PO Take 1 capsule by mouth daily with breakfast.   Vitamin D3 25 MCG (1000 UT) Caps Take 2,000 Units by mouth daily with breakfast.   VITAMIN E PO Take 1 capsule by mouth daily with breakfast.       Allergies:  Allergies  Allergen Reactions  . Hydralazine Hcl Itching and Other (See Comments)    ENTIRE BODY = burning sensation, also in the eyes (they have become very sensitive to light)  . Atorvastatin Other (See Comments)    Per MD    Past Medical History:  Diagnosis Date  . AKI (acute kidney injury) ([employer]) 09/09/2013  . Allergy   . Altered mental status 01/17/2014  . Anemia   . Bronchitis 09/07/2013  . CAD (coronary artery disease)    non-obstructive by LHC (09/25/2013): Proximal and mid LAD serial 20%, proximal circumflex 30%, mid AV groove circumflex 30%, mid RCA mild plaque.  . Cancer ([place])   . Cardiomyopathy, hypertensive ([place]) 09/18/2013  . Carotid stenosis    a. Carotid US (09/26/2011): Bilateral 1-39% ICA.  Marland Kitchen Chest pain 11/27/2018  . Chronic combined systolic and diastolic CHF (congestive heart failure) ([place])   . CKD (chronic kidney disease)   . CKD (chronic kidney disease), stage III ([place]) 09/24/2013   Pt at [facility], Dr. Joelyn Oms  . Diabetes mellitus without complication ([place])   . Edema 09/14/2013  . GERD (gastroesophageal reflux disease)   . History of ovarian cancer   . Hyperlipidemia   . Hypertension   . Hypertensive urgency 09/18/2013  . NICM (nonischemic cardiomyopathy) ([place])    a. Echocardiogram (09/08/2013): Mild LVH, EF 30-35%, no WMA, mild LAE.  Marland Kitchen Obesity (BMI 30-39.9)   . Other primary cardiomyopathies 10/26/2013  . Renal artery stenosis ([place]) 10/11/2018  . Thyroid disease    Seen by specialist    Past Surgical History:  Procedure Laterality Date  . LEFT HEART CATHETERIZATION  WITH CORONARY ANGIOGRAM N/A 09/25/2013   Procedure: LEFT HEART CATHETERIZATION WITH CORONARY ANGIOGRAM;  Surgeon: Burnell Blanks, MD;  Location: [facility] CATH LAB;  Service: Cardiovascular;  Laterality: N/A;  . RENAL ANGIOGRAPHY N/A 10/06/2018   Procedure: RENAL ANGIOGRAPHY;  Surgeon: Marty Heck, MD;  Location: [place] [facility] CV LAB;  Service: Cardiovascular;  Laterality: N/A;    Family History  Problem Relation Age of Onset  . Diabetes Father   . Hypertension Father   . Hypertension Maternal Grandfather     Social History:  reports that she has never smoked. She has never used smokeless tobacco. She reports that she does not drink alcohol or use drugs.    Review of Systems    Thyroid: She has a 2.8 cm mixed echogenic predominantly solid nodule on the right side and a biopsy was benign   Thyroid levels have been normal  Lab Results  Component Value Date  TSH 1.61 05/04/2018   TSH 1.66 12/21/2016   TSH 1.07 12/20/2014          Lipids:  had been treated with Simvastatin 40 mg by PCP with good control but she thinks her medication was stopped when she was last hospitalized and no substitute given       Lab Results  Component Value Date   CHOL 259 (H) 04/12/2019   HDL 56.80 04/12/2019   LDLCALC 181 (H) 04/12/2019   TRIG 105.0 04/12/2019   CHOLHDL 5 04/12/2019                   The blood pressure has been treated with carvedilol, amlodipine 10 mg and lisinopril 5mg . Recently seen by nephrologist   Renal artery ultrasound done in 2016 showed the following: Findings worrisome for a potential hemodynamically significant stenosis involving the origin and mid aspects of the left renal artery. She now has had a vascular stent placed  She thinks her blood pressure has been lower recently and her lisinopril has been reduced because of worsening renal function  BP Readings from Last 3 Encounters:  12/15/18 121/80  12/01/18 (!) 144/66  11/28/18 (!) 150/67     Lab  Results  Component Value Date   CREATININE 2.51 (H) 04/12/2019   CREATININE 2.08 (H) 12/06/2018   CREATININE 2.21 (H) 11/27/2018    Previous vitamin D level has been low, on vitamin D3, 2000 units daily  Lab Results  Component Value Date   VD25OH 40.97 04/12/2019   VD25OH 34.89 01/21/2018   Last foot exam: 08/2018   LABS:  Lab on 04/12/2019  Component Date Value Ref Range Status  . Cholesterol 04/12/2019 259* 0 - 200 mg/dL Final   ATP III Classification       Desirable:  < 200 mg/dL               Borderline High:  200 - 239 mg/dL          High:  > = 240 mg/dL  . Triglycerides 04/12/2019 105.0  0.0 - 149.0 mg/dL Final   Normal:  <150 mg/dLBorderline High:  150 - 199 mg/dL  . HDL 04/12/2019 56.80  >39.00 mg/dL Final  . VLDL 04/12/2019 21.0  0.0 - 40.0 mg/dL Final  . LDL Cholesterol 04/12/2019 181* 0 - 99 mg/dL Final  . Total CHOL/HDL Ratio 04/12/2019 5   Final                  Men          Women1/2 Average Risk     3.4          3.3Average Risk          5.0          4.42X Average Risk          9.6          7.13X Average Risk          15.0          11.0                      . NonHDL 04/12/2019 201.70   Final   NOTE:  Non-HDL goal should be 30 mg/dL higher than patient's LDL goal (i.e. LDL goal of < 70 mg/dL, would have non-HDL goal of < 100 mg/dL)  . VITD 04/12/2019 40.97  30.00 - 100.00 ng/mL Final  . Sodium 04/12/2019 138  135 - 145  mEq/L Final  . Potassium 04/12/2019 4.4  3.5 - 5.1 mEq/L Final  . Chloride 04/12/2019 105  96 - 112 mEq/L Final  . CO2 04/12/2019 27  19 - 32 mEq/L Final  . Glucose, Bld 04/12/2019 111* 70 - 99 mg/dL Final  . BUN 04/12/2019 43* 6 - 23 mg/dL Final  . Creatinine, Ser 04/12/2019 2.51* 0.40 - 1.20 mg/dL Final  . Total Bilirubin 04/12/2019 0.3  0.2 - 1.2 mg/dL Final  . Alkaline Phosphatase 04/12/2019 37* 39 - 117 U/L Final  . AST 04/12/2019 17  0 - 37 U/L Final  . ALT 04/12/2019 12  0 - 35 U/L Final  . Total Protein 04/12/2019 6.9  6.0 - 8.3 g/dL Final   . Albumin 04/12/2019 3.8  3.5 - 5.2 g/dL Final  . Calcium 04/12/2019 9.3  8.4 - 10.5 mg/dL Final  . GFR 04/12/2019 23.12* >60.00 mL/min Final  . Hgb A1c MFr Bld 04/12/2019 6.5  4.6 - 6.5 % Final   Glycemic Control Guidelines for People with Diabetes:Non Diabetic:  <6%Goal of Therapy: <7%Additional Action Suggested:  >8%     Physical Examination:  There were no vitals taken for this visit.     ASSESSMENT:  Diabetes type 2, non-insulin-dependent See history of present illness for discussion of current diabetes management, blood sugar patterns and problems identified  Her A1c is excellent at 6.5  Currently on monotherapy with 0.75 mg weekly dose of Trulicity  Also her blood sugars are being checked sporadically they are fairly good She is however gaining weight from inadequate monitoring of caloric intake She does need to check more readings after meals and this was discussed  For now we will continue same dose of Trulicity unless she continues to gain weight  HYPERTENSION, renal dysfunction and history of renovascular disease: Followed by nephrologist Recent records from nephrologist reviewed  HYPERCHOLESTEROLEMIA: Her LDL is 180 and she needs to be on a statin drug A long discussion was held with the patient about the need for statin drugs, controlling lipids and reassured her that statins are safe with kidney disease.  She is very reluctant to start this because of fear of kidney damage She is also on amlodipine 10 mg and discussed that she should not be restarted on simvastatin   PLAN:   She will continue 6.46 mg Trulicity She can take an acid blocker drug such as Pepcid or Prilosec OTC for heartburn if needed but she prefers to take Gas-X She will check blood sugars more often after meals  Crestor 10 mg prescription has been sent and emphasized to the patient the need for taking this for cardiovascular protection and this will not affect her kidneys  There are no  Patient Instructions on file for this visit.  Counseling time on subjects discussed in assessment and plan sections is over 50% of today's 25 minute visit   04/14/2019, 1:31 PM   Note: This office note was prepared with Estate agent. Any transcriptional errors that result from this process are unintentional.

## 2019-06-07 ENCOUNTER — Other Ambulatory Visit: Payer: Self-pay | Admitting: Endocrinology

## 2019-07-25 IMAGING — CT CT HEAD W/O CM
4 series · 17 of 47 positions shown, 19 images · non-contrast
Comparison: None.

CLINICAL DATA: Hypertension, worst headache of life

EXAM:
CT HEAD WITHOUT CONTRAST
TECHNIQUE: Contiguous axial images were obtained from the base of the skull
through the vertex without intravenous contrast.

[Series 3: head wo · axial · 0.39mm/px · z∈[-170,-45]mm · 7 of 35 slices shown, 9 images]
[im 5/35  brain]
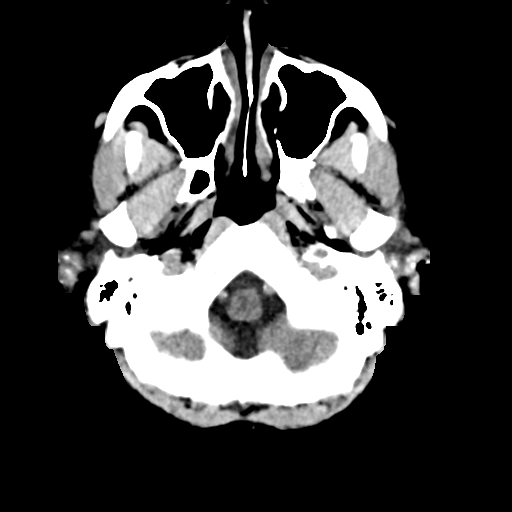
[im 5/35  bone]
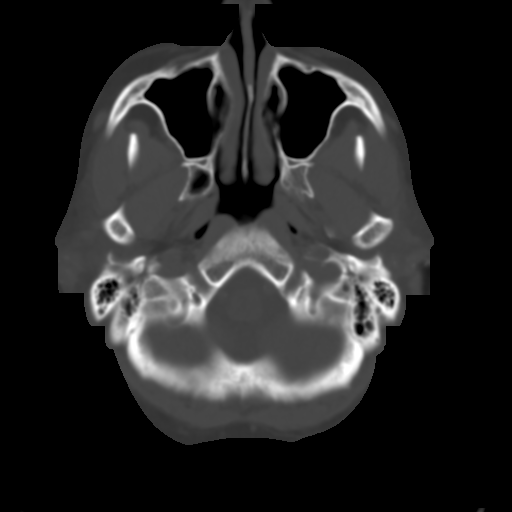
[im 9/35  brain]
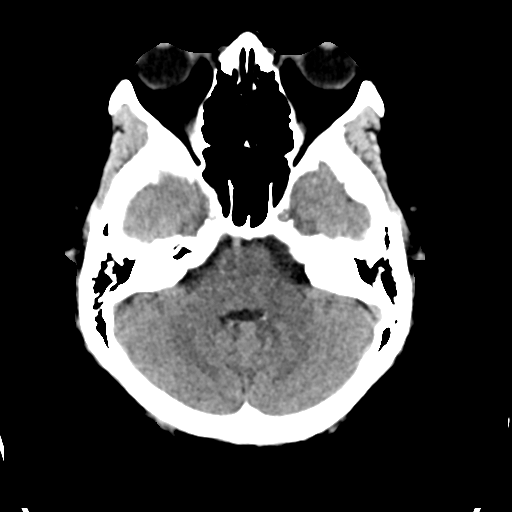
[im 13/35  brain]
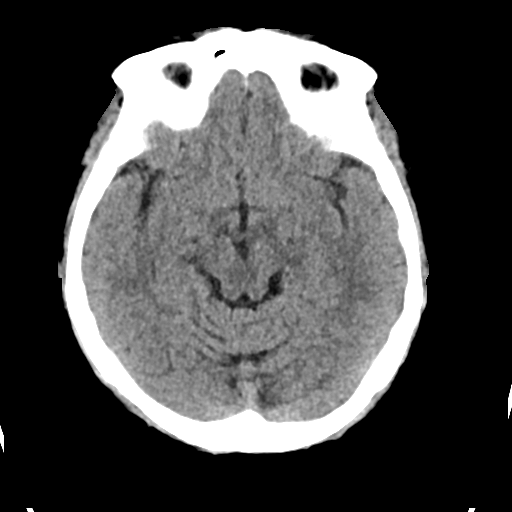
[im 18/35  brain]
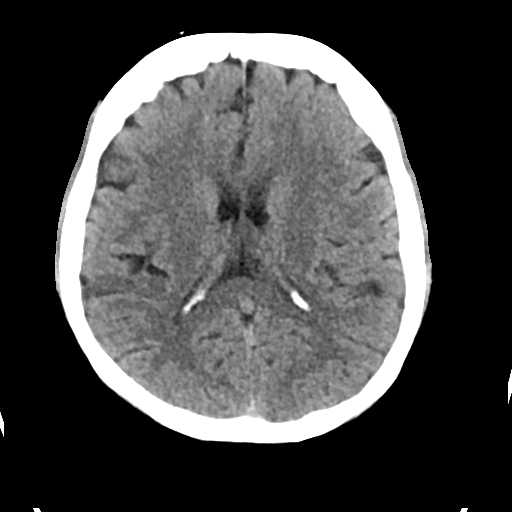
[im 22/35  brain]
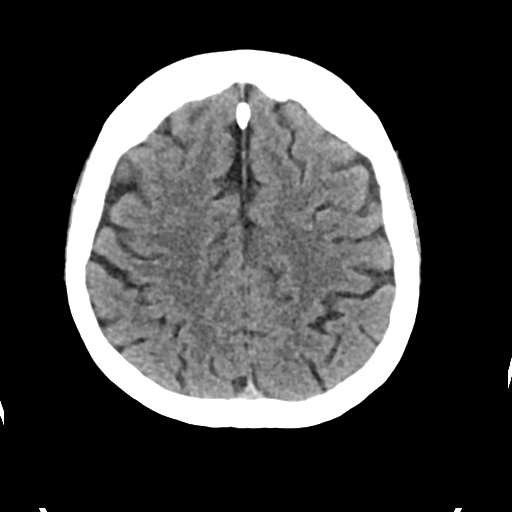
[im 22/35  bone]
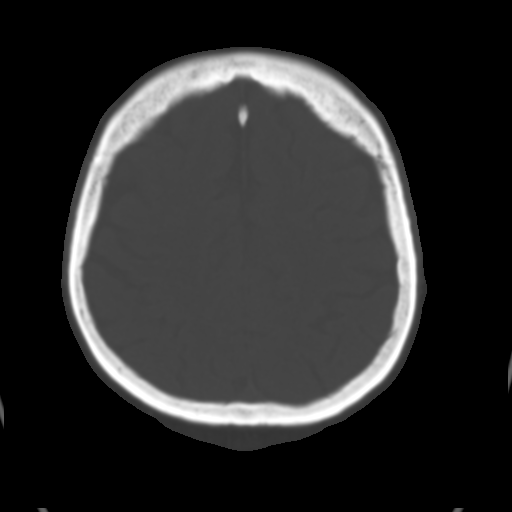
[im 26/35  brain]
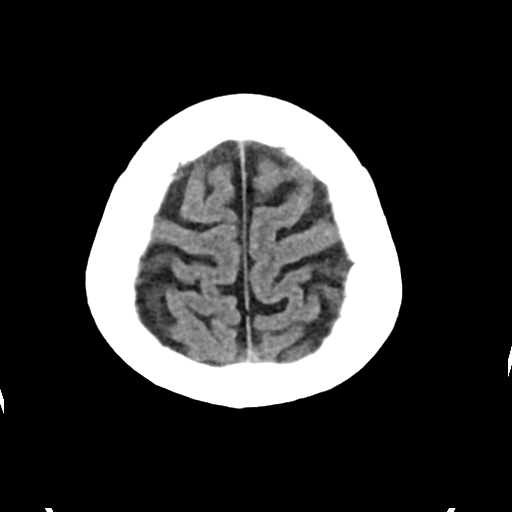
[im 30/35  brain]
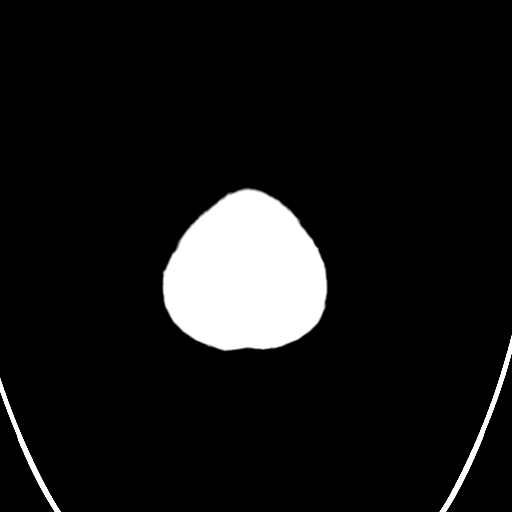

[Series 4: head bone · axial · 0.39mm/px · z∈[-174,-114]mm · 4 of 87 slices shown]
[im 9/87  bone]
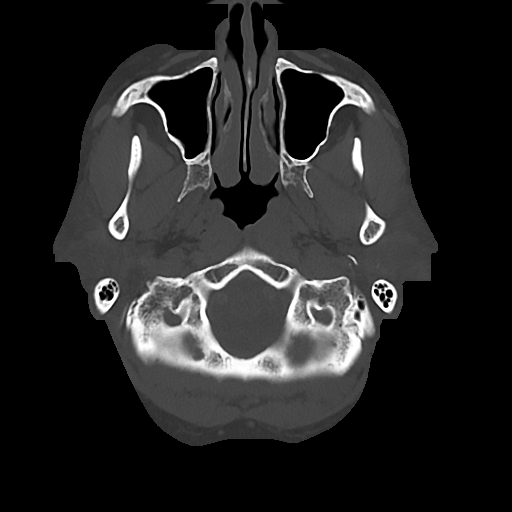
[im 18/87  bone]
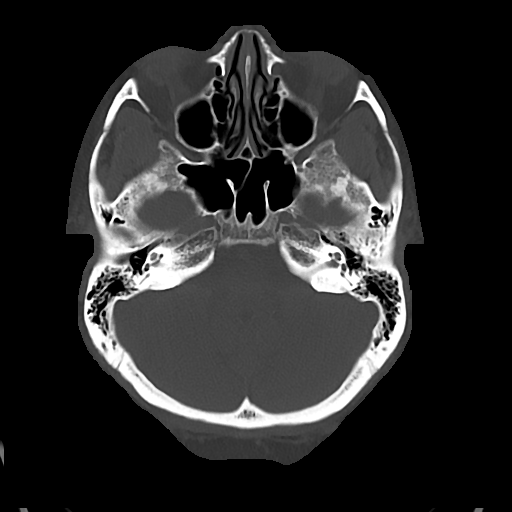
[im 26/87  bone]
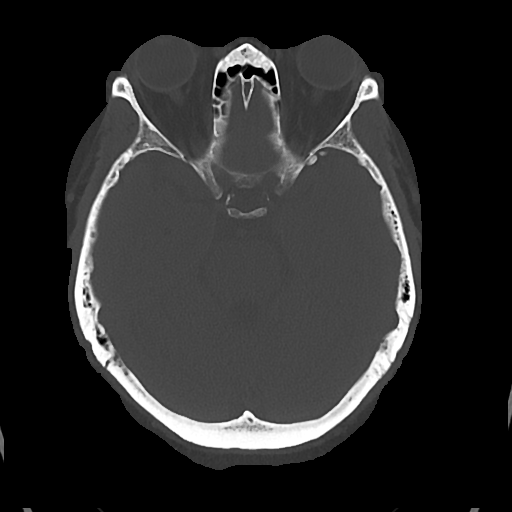
[im 39/87  bone]
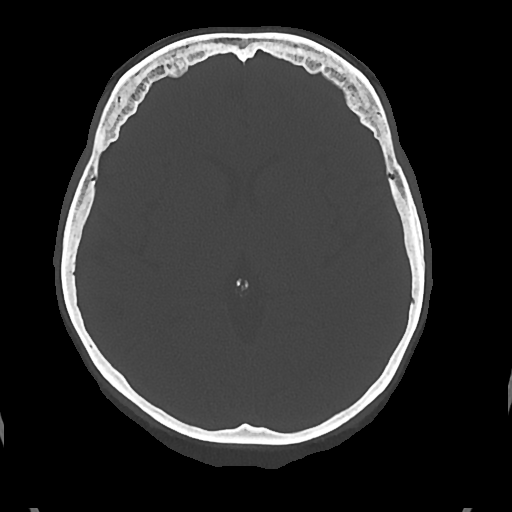

[Series 5: cor soft · coronal · 0.34mm/px · 3 of 67 slices shown]
[im 23/67  brain]
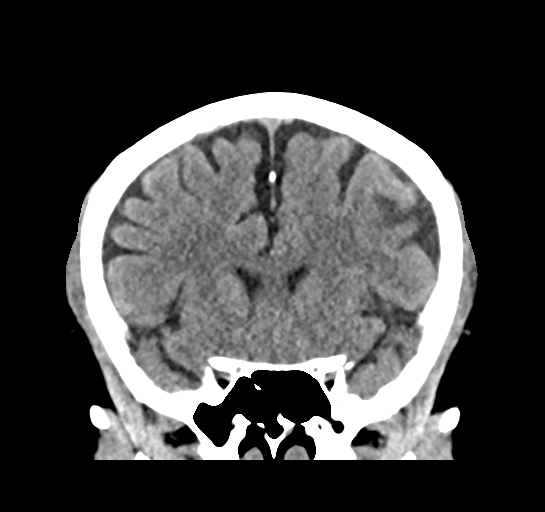
[im 30/67  brain]
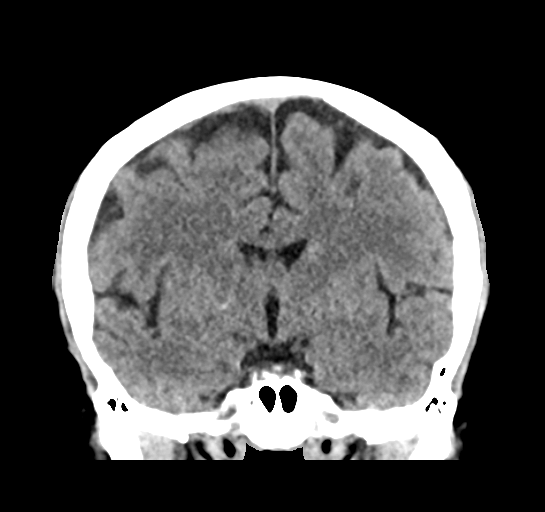
[im 37/67  brain]
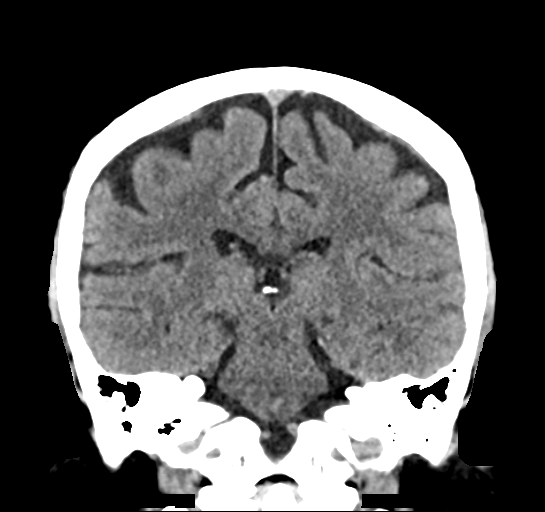

[Series 6: sag soft · sagittal · 0.34mm/px · 3 of 67 slices shown]
[im 23/67  brain]
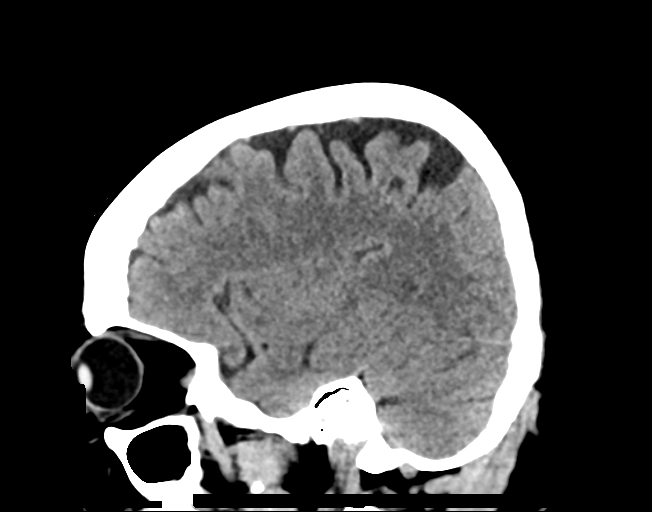
[im 34/67  brain]
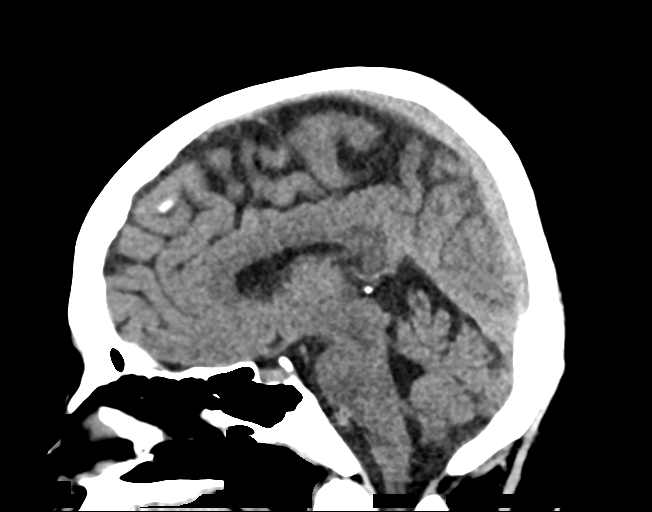
[im 45/67  brain]
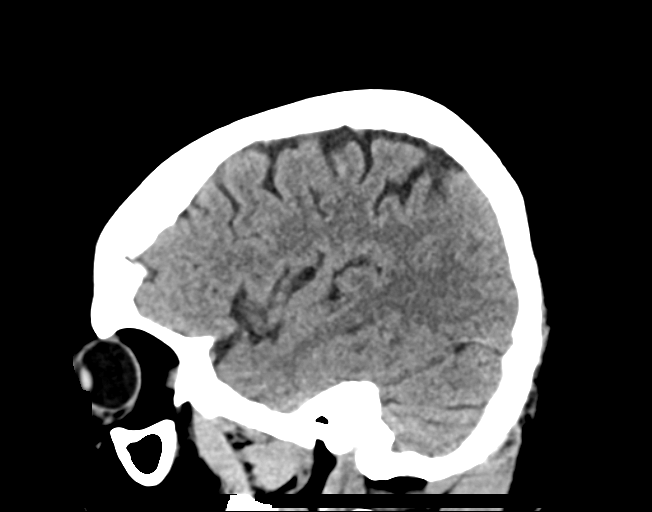

[17 of 47 positions shown; findings below may reference images not displayed]

FINDINGS: Brain: No evidence of acute infarction, hemorrhage, hydrocephalus,
extra-axial collection or mass lesion/mass effect.

Vascular: No hyperdense vessel or unexpected calcification.

Skull: Normal. Negative for fracture or focal lesion.

Sinuses/Orbits: No acute finding.

Other: None.
IMPRESSION: No acute intracranial pathology. No non-contrast CT findings to
explain headache.

## 2019-08-05 ENCOUNTER — Other Ambulatory Visit: Payer: Self-pay | Admitting: Endocrinology

## 2019-08-12 ENCOUNTER — Other Ambulatory Visit: Payer: Self-pay | Admitting: Endocrinology

## 2019-09-05 ENCOUNTER — Other Ambulatory Visit: Payer: Self-pay | Admitting: Endocrinology

## 2019-09-05 ENCOUNTER — Telehealth: Payer: Self-pay

## 2019-09-05 ENCOUNTER — Other Ambulatory Visit: Payer: Self-pay

## 2019-09-05 MED ORDER — TRULICITY 0.75 MG/0.5ML ~~LOC~~ SOAJ: SUBCUTANEOUS | 2 refills | Status: DC

## 2019-09-05 NOTE — Telephone Encounter (Signed)
She needs to be seen for follow-up, has not made an appointment.  We will discuss at that time

## 2019-09-05 NOTE — Telephone Encounter (Signed)
Would you like to change this to something else?  

## 2019-09-05 NOTE — Telephone Encounter (Signed)
Patient called in stating that she needed a PA   rosuvastatin (CRESTOR) 10 MG tablet   Patient advised of the wait time for PA's also

## 2019-09-05 NOTE — Telephone Encounter (Signed)
MEDICATION:   TRULICITY 7.18 ZB/0.1TA SOPN     PHARMACY:  [id number] - [place],  - [address]  IS THIS A 90 DAY SUPPLY :   IS PATIENT OUT OF MEDICATION:   IF NOT; HOW MUCH IS LEFT:   LAST APPOINTMENT DATE: @1 /07/2020  NEXT APPOINTMENT DATE:@Visit  date not found  DO WE HAVE YOUR PERMISSION TO LEAVE A DETAILED MESSAGE:  OTHER COMMENTS:    **Let patient know to contact pharmacy at the end of the day to make sure medication is ready. **  ** Please notify patient to allow 48-72 hours to process**  **Encourage patient to contact the pharmacy for refills or they can request refills through [facility]**

## 2019-09-05 NOTE — Telephone Encounter (Signed)
Rx sent 

## 2019-09-06 NOTE — Telephone Encounter (Signed)
Called pt and left detailed voicemail requesting that she call and schedule an appt.

## 2019-09-20 ENCOUNTER — Other Ambulatory Visit (INDEPENDENT_AMBULATORY_CARE_PROVIDER_SITE_OTHER): Payer: Medicare PPO

## 2019-09-20 ENCOUNTER — Other Ambulatory Visit: Payer: Self-pay

## 2019-09-20 DIAGNOSIS — E782 Mixed hyperlipidemia: Secondary | ICD-10-CM

## 2019-09-20 DIAGNOSIS — E1169 Type 2 diabetes mellitus with other specified complication: Secondary | ICD-10-CM

## 2019-09-20 DIAGNOSIS — E669 Obesity, unspecified: Secondary | ICD-10-CM

## 2019-09-20 LAB — COMPREHENSIVE METABOLIC PANEL
ALT: 12 U/L (ref 0–35)
AST: 17 U/L (ref 0–37)
Albumin: 3.9 g/dL (ref 3.5–5.2)
Alkaline Phosphatase: 43 U/L (ref 39–117)
BUN: 40 mg/dL — ABNORMAL HIGH (ref 6–23)
CO2: 28 mEq/L (ref 19–32)
Calcium: 9.6 mg/dL (ref 8.4–10.5)
Chloride: 105 mEq/L (ref 96–112)
Creatinine, Ser: 2.24 mg/dL — ABNORMAL HIGH (ref 0.40–1.20)
GFR: 26.33 mL/min — ABNORMAL LOW (ref >60.00)
Glucose, Bld: 186 mg/dL — ABNORMAL HIGH (ref 70–99)
Potassium: 4.2 mEq/L (ref 3.5–5.1)
Sodium: 141 mEq/L (ref 135–145)
Total Bilirubin: 0.3 mg/dL (ref 0.2–1.2)
Total Protein: 6.9 g/dL (ref 6.0–8.3)

## 2019-09-20 LAB — LIPID PANEL
Cholesterol: 204 mg/dL — ABNORMAL HIGH (ref 0–200)
HDL: 51.2 mg/dL (ref >39.00)
NonHDL: 152.56
Total CHOL/HDL Ratio: 4
Triglycerides: 263 mg/dL — ABNORMAL HIGH (ref 0.0–149.0)
VLDL: 52.6 mg/dL — ABNORMAL HIGH (ref 0.0–40.0)

## 2019-09-20 LAB — HEMOGLOBIN A1C: Hgb A1c MFr Bld: 6.9 % — ABNORMAL HIGH (ref 4.6–6.5)

## 2019-09-20 LAB — LDL CHOLESTEROL, DIRECT: Direct LDL: 111 mg/dL

## 2019-09-21 ENCOUNTER — Other Ambulatory Visit: Payer: Medicare PPO

## 2019-09-21 ENCOUNTER — Other Ambulatory Visit: Payer: BC Managed Care – PPO

## 2019-09-25 ENCOUNTER — Encounter: Payer: Self-pay | Admitting: Endocrinology

## 2019-09-25 ENCOUNTER — Ambulatory Visit: Payer: Medicare PPO | Admitting: Endocrinology

## 2019-09-25 ENCOUNTER — Other Ambulatory Visit: Payer: Self-pay

## 2019-09-25 VITALS — BP 144/74 | HR 87 | Ht 66.0 in | Wt 215.0 lb

## 2019-09-25 DIAGNOSIS — Z23 Encounter for immunization: Secondary | ICD-10-CM | POA: Diagnosis not present

## 2019-09-25 DIAGNOSIS — E669 Obesity, unspecified: Secondary | ICD-10-CM

## 2019-09-25 DIAGNOSIS — E782 Mixed hyperlipidemia: Secondary | ICD-10-CM

## 2019-09-25 DIAGNOSIS — E1169 Type 2 diabetes mellitus with other specified complication: Secondary | ICD-10-CM

## 2019-09-25 LAB — GLUCOSE, POCT (MANUAL RESULT ENTRY): POC Glucose: 152 mg/dl — AB (ref 70–99)

## 2019-09-25 MED ORDER — ROSUVASTATIN CALCIUM 10 MG PO TABS: ORAL_TABLET | ORAL | 3 refills | Status: DC

## 2019-09-25 NOTE — Patient Instructions (Signed)
Covid vaccine is SAFE!  You may register to get the Covid vaccine at either of the 2 options:  [place] website   [email or web address]  Telephone number: [phone], Option 2   Also can register at the [facility] health website, wait listing may be an option  [email or web address] or call [phone]

## 2019-09-25 NOTE — Progress Notes (Signed)
Patient ID: Teresa Poole, female   DOB: 15-Dec-1950, 69 y.o.   MRN: [id number]            Reason for Appointment: Follow-up for Type 2 Diabetes   History of Present Illness:          Diagnosis: Type 2 diabetes mellitus, date of diagnosis: 2000        Past history: At diagnosis she was having fatigue and she was initially treated with metformin Metformin was stopped about 1-1/2 years ago when she decided not to take any medications. She was in the hospital in 08/2013 and her blood sugar was about 460 with A1c of 15. She was started on Lantus and NovoLog and has been continued on this regimen since then. Has not been back on any oral hypoglycemic drugs in the past year  Recent history:   Non-insulin hypoglycemic drugs the patient is taking are:  Trulicity 0.01 mg weekly  On her initial consultation she was started on Trulicity to help with her postprandial readings and she was able to stop insulin with this  Her A1c is relatively higher at 6.9, was 6.5  Last visit was in 03/2019  Current management, blood sugar patterns and problems identified:  She has not checked her glucose at all and refuses to do so  She said that she is not motivated to watch her diet and because of stress she is eating sweets and candy throughout the day  This is despite not having an increased appetite and does not think she is eating any larger portions  She is not motivated to exercise at all  Since her last weight in April 2020 she has gained about 32 pounds  She does think she is taking her Trulicity regularly  Lab glucose was 186 nonfasting and today it is 152 in the afternoon      Side effects from medications have been: GI distress with over 1000 mg metformin, Trulicity 1.5 mg causes decreased appetite Compliance with the medical regimen: Fair  Glucose monitoring: Using One Touch Verio monitor  Blood sugars not available   Self-care:      Meals:  2-3 meals per day. Breakfast is  meat, egg, potatoes and sometimes oatmeal/fruit.                Dietician visit, most recent: 3/16.               Weight history: Previous range 205-245  Wt Readings from Last 3 Encounters:  09/25/19 215 lb (97.5 kg)  12/15/18 183 lb (83 kg)  12/09/18 184 lb (83.5 kg)    Glycemic control:    Lab Results  Component Value Date   HGBA1C 6.9 (H) 09/20/2019   HGBA1C 6.5 04/12/2019   HGBA1C 6.8 (H) 12/06/2018   Lab Results  Component Value Date   MICROALBUR 89.3 (H) 01/21/2018   LDLCALC 181 (H) 04/12/2019   CREATININE 2.24 (H) 09/20/2019     Office Visit on 09/25/2019  Component Date Value Ref Range Status  . POC Glucose 09/25/2019 152* 70 - 99 mg/dl Final  Lab on 09/20/2019  Component Date Value Ref Range Status  . Cholesterol 09/20/2019 204* 0 - 200 mg/dL Final   ATP III Classification       Desirable:  < 200 mg/dL               Borderline High:  200 - 239 mg/dL          High:  > =  240 mg/dL  . Triglycerides 09/20/2019 263.0* 0.0 - 149.0 mg/dL Final   Normal:  <150 mg/dLBorderline High:  150 - 199 mg/dL  . HDL 09/20/2019 51.20  >39.00 mg/dL Final  . VLDL 09/20/2019 52.6* 0.0 - 40.0 mg/dL Final  . Total CHOL/HDL Ratio 09/20/2019 4   Final                  Men          Women1/2 Average Risk     3.4          3.3Average Risk          5.0          4.42X Average Risk          9.6          7.13X Average Risk          15.0          11.0                      . NonHDL 09/20/2019 152.56   Final   NOTE:  Non-HDL goal should be 30 mg/dL higher than patient's LDL goal (i.e. LDL goal of < 70 mg/dL, would have non-HDL goal of < 100 mg/dL)  . Sodium 09/20/2019 141  135 - 145 mEq/L Final  . Potassium 09/20/2019 4.2  3.5 - 5.1 mEq/L Final  . Chloride 09/20/2019 105  96 - 112 mEq/L Final  . CO2 09/20/2019 28  19 - 32 mEq/L Final  . Glucose, Bld 09/20/2019 186* 70 - 99 mg/dL Final  . BUN 09/20/2019 40* 6 - 23 mg/dL Final  . Creatinine, Ser 09/20/2019 2.24* 0.40 - 1.20 mg/dL Final  . Total  Bilirubin 09/20/2019 0.3  0.2 - 1.2 mg/dL Final  . Alkaline Phosphatase 09/20/2019 43  39 - 117 U/L Final  . AST 09/20/2019 17  0 - 37 U/L Final  . ALT 09/20/2019 12  0 - 35 U/L Final  . Total Protein 09/20/2019 6.9  6.0 - 8.3 g/dL Final  . Albumin 09/20/2019 3.9  3.5 - 5.2 g/dL Final  . GFR 09/20/2019 26.33* >60.00 mL/min Final  . Calcium 09/20/2019 9.6  8.4 - 10.5 mg/dL Final  . Hgb A1c MFr Bld 09/20/2019 6.9* 4.6 - 6.5 % Final   Glycemic Control Guidelines for People with Diabetes:Non Diabetic:  <6%Goal of Therapy: <7%Additional Action Suggested:  >8%   . Direct LDL 09/20/2019 111.0  mg/dL Final   Optimal:  <100 mg/dLNear or Above Optimal:  100-129 mg/dLBorderline High:  130-159 mg/dLHigh:  160-189 mg/dLVery High:  >190 mg/dL     Allergies as of 09/25/2019      Reactions   Hydralazine Hcl Itching, Other (See Comments)   ENTIRE BODY = burning sensation, also in the eyes (they have become very sensitive to light)   Atorvastatin Other (See Comments)   Per MD      Medication List       Accurate as of September 25, 2019 11:59 PM. If you have any questions, ask your nurse or doctor.        amLODipine 10 MG tablet Commonly known as: NORVASC Take 1 tablet (10 mg total) by mouth daily.   carvedilol 25 MG tablet Commonly known as: COREG Take 25 mg by mouth 2 (two) times daily with a meal.   CINNAMON PO Take 1 capsule by mouth daily with breakfast.   glucose blood test strip Commonly known as: ONE  TOUCH ULTRA TEST Use to check blood sugar 3 times daily Dx code E11.65   OneTouch Verio test strip Generic drug: glucose blood CHECK BLOOD SUGAR THREE TIMES DAILY   indapamide 2.5 MG tablet Commonly known as: LOZOL Take 2.5 mg by mouth daily.   lisinopril 40 MG tablet Commonly known as: ZESTRIL Take 1 tablet (40 mg total) by mouth daily.   Multivitamin Gummies Adult Chew Chew 1 tablet by mouth daily with breakfast.   rosuvastatin 10 MG tablet Commonly known as: CRESTOR TAKE  1 TABLET(10 MG) BY MOUTH DAILY   Trulicity 4.85 IO/2.7OJ Sopn Generic drug: Dulaglutide INJECT 0.75MG  INTO THE SKIN EVERY SUNDAY   TURMERIC PO Take 1 capsule by mouth daily with breakfast.   Vitamin D3 25 MCG (1000 UT) Caps Take 2,000 Units by mouth daily with breakfast.   VITAMIN E PO Take 1 capsule by mouth daily with breakfast.       Allergies:  Allergies  Allergen Reactions  . Hydralazine Hcl Itching and Other (See Comments)    ENTIRE BODY = burning sensation, also in the eyes (they have become very sensitive to light)  . Atorvastatin Other (See Comments)    Per MD    Past Medical History:  Diagnosis Date  . AKI (acute kidney injury) ([place]) 09/09/2013  . Allergy   . Altered mental status 01/17/2014  . Anemia   . Bronchitis 09/07/2013  . CAD (coronary artery disease)    non-obstructive by LHC (09/25/2013): Proximal and mid LAD serial 20%, proximal circumflex 30%, mid AV groove circumflex 30%, mid RCA mild plaque.  . Cancer ([place])   . Cardiomyopathy, hypertensive ([place]) 09/18/2013  . Carotid stenosis    a. Carotid US (09/26/2011): Bilateral 1-39% ICA.  Marland Kitchen Chest pain 11/27/2018  . Chronic combined systolic and diastolic CHF (congestive heart failure) ([place])   . CKD (chronic kidney disease)   . CKD (chronic kidney disease), stage III 09/24/2013   Pt at [facility], Dr. Joelyn Oms  . Diabetes mellitus without complication ([place])   . Edema 09/14/2013  . GERD (gastroesophageal reflux disease)   . History of ovarian cancer   . Hyperlipidemia   . Hypertension   . Hypertensive urgency 09/18/2013  . NICM (nonischemic cardiomyopathy) ([place])    a. Echocardiogram (09/08/2013): Mild LVH, EF 30-35%, no WMA, mild LAE.  Marland Kitchen Obesity (BMI 30-39.9)   . Other primary cardiomyopathies 10/26/2013  . Renal artery stenosis ([place]) 10/11/2018  . Thyroid disease    Seen by specialist    Past Surgical History:  Procedure Laterality Date  . LEFT HEART CATHETERIZATION WITH CORONARY ANGIOGRAM N/A 09/25/2013    Procedure: LEFT HEART CATHETERIZATION WITH CORONARY ANGIOGRAM;  Surgeon: Burnell Blanks, MD;  Location: [facility] CATH LAB;  Service: Cardiovascular;  Laterality: N/A;  . RENAL ANGIOGRAPHY N/A 10/06/2018   Procedure: RENAL ANGIOGRAPHY;  Surgeon: Marty Heck, MD;  Location: [facility] CV LAB;  Service: Cardiovascular;  Laterality: N/A;    Family History  Problem Relation Age of Onset  . Diabetes Father   . Hypertension Father   . Hypertension Maternal Grandfather     Social History:  reports that she has never smoked. She has never used smokeless tobacco. She reports that she does not drink alcohol or use drugs.    Review of Systems    Thyroid: She has a 2.8 cm mixed echogenic predominantly solid nodule on the right side and a biopsy was benign   Thyroid levels have been normal  Lab Results  Component  Value Date   TSH 1.61 05/04/2018   TSH 1.66 12/21/2016   TSH 1.07 12/20/2014         Lipids:  had been treated with Simvastatin 40 mg by PCP with good control but she thinks her medication was stopped when she was last hospitalized and she was told not to take it for unknown reasons She may be taking Crestor but unlikely to be regular since she did not have a refill  Her LDL is still relatively better than previous level of 181      Lab Results  Component Value Date   CHOL 204 (H) 09/20/2019   HDL 51.20 09/20/2019   LDLCALC 181 (H) 04/12/2019   LDLDIRECT 111.0 09/20/2019   TRIG 263.0 (H) 09/20/2019   CHOLHDL 4 09/20/2019                   The blood pressure has been treated with carvedilol, amlodipine 10 mg and lisinopril 5mg . Recently seen by nephrologist   Renal artery ultrasound done in 2016 showed the following: He potential hemodynamically significant stenosis involving the origin and mid aspects of the left renal artery. She has had a vascular stent placed  Blood pressure appears to be relatively better  BP Readings from Last 3 Encounters:  09/25/19  (!) 144/74  12/15/18 121/80  12/01/18 (!) 144/66     Lab Results  Component Value Date   CREATININE 2.24 (H) 09/20/2019   CREATININE 2.51 (H) 04/12/2019   CREATININE 2.08 (H) 12/06/2018    Baseline vitamin D level has been low, on vitamin D3, 2000 units daily  Lab Results  Component Value Date   VD25OH 40.97 04/12/2019   VD25OH 34.89 01/21/2018   Last foot exam: 08/2018   LABS:  Office Visit on 09/25/2019  Component Date Value Ref Range Status  . POC Glucose 09/25/2019 152* 70 - 99 mg/dl Final  Lab on 09/20/2019  Component Date Value Ref Range Status  . Cholesterol 09/20/2019 204* 0 - 200 mg/dL Final   ATP III Classification       Desirable:  < 200 mg/dL               Borderline High:  200 - 239 mg/dL          High:  > = 240 mg/dL  . Triglycerides 09/20/2019 263.0* 0.0 - 149.0 mg/dL Final   Normal:  <150 mg/dLBorderline High:  150 - 199 mg/dL  . HDL 09/20/2019 51.20  >39.00 mg/dL Final  . VLDL 09/20/2019 52.6* 0.0 - 40.0 mg/dL Final  . Total CHOL/HDL Ratio 09/20/2019 4   Final                  Men          Women1/2 Average Risk     3.4          3.3Average Risk          5.0          4.42X Average Risk          9.6          7.13X Average Risk          15.0          11.0                      . NonHDL 09/20/2019 152.56   Final   NOTE:  Non-HDL goal should be 30 mg/dL higher than patient's  LDL goal (i.e. LDL goal of < 70 mg/dL, would have non-HDL goal of < 100 mg/dL)  . Sodium 09/20/2019 141  135 - 145 mEq/L Final  . Potassium 09/20/2019 4.2  3.5 - 5.1 mEq/L Final  . Chloride 09/20/2019 105  96 - 112 mEq/L Final  . CO2 09/20/2019 28  19 - 32 mEq/L Final  . Glucose, Bld 09/20/2019 186* 70 - 99 mg/dL Final  . BUN 09/20/2019 40* 6 - 23 mg/dL Final  . Creatinine, Ser 09/20/2019 2.24* 0.40 - 1.20 mg/dL Final  . Total Bilirubin 09/20/2019 0.3  0.2 - 1.2 mg/dL Final  . Alkaline Phosphatase 09/20/2019 43  39 - 117 U/L Final  . AST 09/20/2019 17  0 - 37 U/L Final  . ALT 09/20/2019  12  0 - 35 U/L Final  . Total Protein 09/20/2019 6.9  6.0 - 8.3 g/dL Final  . Albumin 09/20/2019 3.9  3.5 - 5.2 g/dL Final  . GFR 09/20/2019 26.33* >60.00 mL/min Final  . Calcium 09/20/2019 9.6  8.4 - 10.5 mg/dL Final  . Hgb A1c MFr Bld 09/20/2019 6.9* 4.6 - 6.5 % Final   Glycemic Control Guidelines for People with Diabetes:Non Diabetic:  <6%Goal of Therapy: <7%Additional Action Suggested:  >8%   . Direct LDL 09/20/2019 111.0  mg/dL Final   Optimal:  <100 mg/dLNear or Above Optimal:  100-129 mg/dLBorderline High:  130-159 mg/dLHigh:  160-189 mg/dLVery High:  >190 mg/dL    Physical Examination:  BP (!) 144/74 (BP Location: Left Arm, Patient Position: Sitting, Cuff Size: Large)   Pulse 87   Ht 5\' 6"  (1.676 m)   Wt 215 lb (97.5 kg)   SpO2 97%   BMI 34.70 kg/m      ASSESSMENT:  Diabetes type 2, non-insulin-dependent See history of present illness for discussion of current diabetes management, blood sugar patterns and problems identified  Her A1c is excellent at 6.5  Currently on monotherapy with 0.75 mg weekly dose of Trulicity  Also her blood sugars are being checked sporadically they are fairly good She is however gaining weight from inadequate monitoring of caloric intake She does need to check more readings after meals and this was discussed  For now we will continue same dose of Trulicity unless she continues to gain weight  HYPERTENSION, renal dysfunction and history of renovascular disease: Followed by nephrologist Blood pressure high normal but she is somewhat anxious  HYPERCHOLESTEROLEMIA: Her LDL at baseline was about 180 Discussed that with taking Crestor she appears to have no side effects or problems with her liver functions and she needs to continue She is somewhat reluctant to do this but encouraged her to get the prescription refilled   PLAN:   She will continue 4.40 mg Trulicity Since she appears to be noncompliant because of depression encouraged her to  talk to her PCP but she refuses Also refuses to consider antidepressant medication She will hopefully start checking her blood sugars also but currently does not appear motivated Encouraged her to do at least some indoor walking Discussed need for weight loss  To restart Crestor regularly and will recheck lipids on the next visit  Explained to her that the Covid vaccine is safe and because of her age and concomitant problems she is at high risk and needs to do this, information given on where to schedule this  Patient Instructions  Covid vaccine is SAFE!  You may register to get the Covid vaccine at either of the 2 options:  [facility]  health department website   [email or web address]  Telephone number: [phone], Option 2   Also can register at the [facility], wait listing may be an option  [email or web address] or call [phone]        09/26/2019, 12:45 PM   Note: This office note was prepared with Dragon voice recognition system technology. Any transcriptional errors that result from this process are unintentional.

## 2019-09-25 NOTE — Progress Notes (Signed)
oc

## 2019-10-15 ENCOUNTER — Ambulatory Visit: Payer: Medicare PPO | Attending: Internal Medicine

## 2019-10-15 DIAGNOSIS — Z23 Encounter for immunization: Secondary | ICD-10-CM | POA: Insufficient documentation

## 2019-10-15 NOTE — Progress Notes (Signed)
   Covid-19 Vaccination Clinic  Name:  Teresa Poole    MRN: [id number] DOB: [birth year]  10/15/2019  [name] was observed post Covid-19 immunization for 30 minutes based on pre-vaccination screening without incidence. She was provided with Vaccine Information Sheet and instruction to access the V-Safe system.   [name] was instructed to call 911 with any severe reactions post vaccine: Marland Kitchen Difficulty breathing  . Swelling of your face and throat  . A fast heartbeat  . A bad rash all over your body  . Dizziness and weakness    Immunizations Administered    Name Date Dose VIS Date Route   Pfizer COVID-19 Vaccine 10/15/2019  2:00 PM 0.3 mL 08/04/2019 Intramuscular   Manufacturer: [place]   Lot: [id number]   [place]: [id number]

## 2019-11-08 ENCOUNTER — Ambulatory Visit: Payer: Medicare PPO | Attending: Internal Medicine

## 2019-11-08 DIAGNOSIS — Z23 Encounter for immunization: Secondary | ICD-10-CM

## 2019-11-08 NOTE — Progress Notes (Signed)
   Covid-19 Vaccination Clinic  Name:  Teresa Poole    MRN: [id number] DOB: [birth year]  11/08/2019  Ms. Teresa Poole was observed post Covid-19 immunization for 15 minutes without incident. She was provided with Vaccine Information Sheet and instruction to access the V-Safe system.   Ms. Teresa Poole was instructed to call 911 with any severe reactions post vaccine: Marland Kitchen Difficulty breathing  . Swelling of face and throat  . A fast heartbeat  . A bad rash all over body  . Dizziness and weakness   Immunizations Administered    Name Date Dose VIS Date Route   Pfizer COVID-19 Vaccine 11/08/2019  1:38 PM 0.3 mL 08/04/2019 Intramuscular   Manufacturer: [place]   Lot: [id number]   [place]: [id number]

## 2019-11-13 ENCOUNTER — Ambulatory Visit: Payer: Medicare PPO | Admitting: Interventional Cardiology

## 2019-11-21 ENCOUNTER — Other Ambulatory Visit: Payer: Self-pay | Admitting: Endocrinology

## 2020-01-10 LAB — HM DIABETES EYE EXAM

## 2020-02-12 ENCOUNTER — Telehealth: Payer: Self-pay | Admitting: Endocrinology

## 2020-02-13 ENCOUNTER — Other Ambulatory Visit: Payer: Self-pay

## 2020-02-13 MED ORDER — TRULICITY 0.75 MG/0.5ML ~~LOC~~ SOAJ: SUBCUTANEOUS | 0 refills | Status: DC

## 2020-02-13 NOTE — Telephone Encounter (Signed)
Rx sent for 30 day supply. After pt comes to appt, further refills may be issued, provided MD does not change medication.

## 2020-02-13 NOTE — Telephone Encounter (Signed)
Patient is scheduled for 03/11/20

## 2020-03-08 ENCOUNTER — Other Ambulatory Visit: Payer: Self-pay

## 2020-03-08 ENCOUNTER — Other Ambulatory Visit: Payer: Medicare PPO

## 2020-03-08 ENCOUNTER — Other Ambulatory Visit (INDEPENDENT_AMBULATORY_CARE_PROVIDER_SITE_OTHER): Payer: Medicare PPO

## 2020-03-08 DIAGNOSIS — E1169 Type 2 diabetes mellitus with other specified complication: Secondary | ICD-10-CM

## 2020-03-08 DIAGNOSIS — E782 Mixed hyperlipidemia: Secondary | ICD-10-CM

## 2020-03-08 DIAGNOSIS — E669 Obesity, unspecified: Secondary | ICD-10-CM | POA: Diagnosis not present

## 2020-03-08 LAB — BASIC METABOLIC PANEL
BUN: 36 mg/dL — ABNORMAL HIGH (ref 6–23)
CO2: 27 mEq/L (ref 19–32)
Calcium: 9.1 mg/dL (ref 8.4–10.5)
Chloride: 103 mEq/L (ref 96–112)
Creatinine, Ser: 2.31 mg/dL — ABNORMAL HIGH (ref 0.40–1.20)
GFR: 25.37 mL/min — ABNORMAL LOW (ref >60.00)
Glucose, Bld: 169 mg/dL — ABNORMAL HIGH (ref 70–99)
Potassium: 4.6 mEq/L (ref 3.5–5.1)
Sodium: 137 mEq/L (ref 135–145)

## 2020-03-08 LAB — LIPID PANEL
Cholesterol: 281 mg/dL — ABNORMAL HIGH (ref 0–200)
HDL: 53.3 mg/dL (ref >39.00)
LDL Cholesterol: 192 mg/dL — ABNORMAL HIGH (ref 0–99)
NonHDL: 227.97
Total CHOL/HDL Ratio: 5
Triglycerides: 181 mg/dL — ABNORMAL HIGH (ref 0.0–149.0)
VLDL: 36.2 mg/dL (ref 0.0–40.0)

## 2020-03-08 LAB — HEMOGLOBIN A1C: Hgb A1c MFr Bld: 6.5 % (ref 4.6–6.5)

## 2020-03-09 ENCOUNTER — Other Ambulatory Visit: Payer: Self-pay | Admitting: Endocrinology

## 2020-03-11 ENCOUNTER — Other Ambulatory Visit: Payer: Self-pay

## 2020-03-11 ENCOUNTER — Ambulatory Visit: Payer: Medicare PPO | Admitting: Endocrinology

## 2020-03-11 ENCOUNTER — Encounter: Payer: Self-pay | Admitting: Endocrinology

## 2020-03-11 VITALS — BP 164/82 | HR 74 | Ht 66.0 in | Wt 211.8 lb

## 2020-03-11 DIAGNOSIS — E782 Mixed hyperlipidemia: Secondary | ICD-10-CM

## 2020-03-11 DIAGNOSIS — E1169 Type 2 diabetes mellitus with other specified complication: Secondary | ICD-10-CM

## 2020-03-11 DIAGNOSIS — E669 Obesity, unspecified: Secondary | ICD-10-CM

## 2020-03-11 DIAGNOSIS — E041 Nontoxic single thyroid nodule: Secondary | ICD-10-CM

## 2020-03-11 MED ORDER — ROSUVASTATIN CALCIUM 10 MG PO TABS: ORAL_TABLET | ORAL | 3 refills | Status: DC

## 2020-03-11 NOTE — Patient Instructions (Addendum)
Stay on Rosuvastatin, important to control Cholesterol  Walk daily when cooler

## 2020-03-11 NOTE — Progress Notes (Signed)
Patient ID: Teresa Poole, female   DOB: 07-Jan-1951, 69 y.o.   MRN: [id number]            Reason for Appointment: Follow-up for Type 2 Diabetes   History of Present Illness:          Diagnosis: Type 2 diabetes mellitus, date of diagnosis: 2000        Past history: At diagnosis she was having fatigue and she was initially treated with metformin Metformin was stopped about 1-1/2 years ago when she decided not to take any medications. She was in the hospital in 08/2013 and her blood sugar was about 460 with A1c of 15. She was started on Lantus and NovoLog and has been continued on this regimen since then. Has not been back on any oral hypoglycemic drugs in the past year  Recent history:   Non-insulin hypoglycemic drugs the patient is taking are:  Trulicity 5.67 mg weekly  On her initial consultation she was started on Trulicity to help with her postprandial readings and she was able to stop insulin with this  Her A1c is relatively   Current management, blood sugar patterns and problems identified:  She has not checked her glucose as before and does not want to do so, not able to get freestyle libre because of not being on insulin  She has finally leveled off her weight gain and is down 4 pounds  However she will still need some sweets like cupcakes  She only does a little walking periodically and does not like going out in the heat  No side effects from Trulicity  She does state she is taking her Trulicity regularly  Lab glucose was 169   Previously had higher doses of Trulicity which caused suppression of appetite      Side effects from medications have been: GI distress with over 1000 mg metformin, Trulicity 1.5 mg causes decreased appetite Compliance with the medical regimen: Fair  Glucose monitoring: Using One Touch Verio monitor  Blood sugars not done   Self-care:      Meals:  2-3 meals per day. Breakfast is meat, egg, potatoes and sometimes oatmeal/fruit.           Dietician visit, most recent: 3/16.               Weight history: Previous range 205-245  Wt Readings from Last 3 Encounters:  03/11/20 211 lb 12.8 oz (96.1 kg)  09/25/19 215 lb (97.5 kg)  12/15/18 183 lb (83 kg)    Glycemic control:    Lab Results  Component Value Date   HGBA1C 6.5 03/08/2020   HGBA1C 6.9 (H) 09/20/2019   HGBA1C 6.5 04/12/2019   Lab Results  Component Value Date   MICROALBUR 89.3 (H) 01/21/2018   LDLCALC 192 (H) 03/08/2020   CREATININE 2.31 (H) 03/08/2020     Lab on 03/08/2020  Component Date Value Ref Range Status  . Cholesterol 03/08/2020 281* 0 - 200 mg/dL Final   ATP III Classification       Desirable:  < 200 mg/dL               Borderline High:  200 - 239 mg/dL          High:  > = 240 mg/dL  . Triglycerides 03/08/2020 181.0* 0 - 149 mg/dL Final   Normal:  <150 mg/dLBorderline High:  150 - 199 mg/dL  . HDL 03/08/2020 53.30  >39.00 mg/dL Final  . VLDL 03/08/2020 36.2  0.0 -  40.0 mg/dL Final  . LDL Cholesterol 03/08/2020 192* 0 - 99 mg/dL Final  . Total CHOL/HDL Ratio 03/08/2020 5   Final                  Men          Women1/2 Average Risk     3.4          3.3Average Risk          5.0          4.42X Average Risk          9.6          7.13X Average Risk          15.0          11.0                      . NonHDL 03/08/2020 227.97   Final   NOTE:  Non-HDL goal should be 30 mg/dL higher than patient's LDL goal (i.e. LDL goal of < 70 mg/dL, would have non-HDL goal of < 100 mg/dL)  . Sodium 03/08/2020 137  135 - 145 mEq/L Final  . Potassium 03/08/2020 4.6  3.5 - 5.1 mEq/L Final  . Chloride 03/08/2020 103  96 - 112 mEq/L Final  . CO2 03/08/2020 27  19 - 32 mEq/L Final  . Glucose, Bld 03/08/2020 169* 70 - 99 mg/dL Final  . BUN 03/08/2020 36* 6 - 23 mg/dL Final  . Creatinine, Ser 03/08/2020 2.31* 0.40 - 1.20 mg/dL Final  . GFR 03/08/2020 25.37* >60.00 mL/min Final  . Calcium 03/08/2020 9.1  8.4 - 10.5 mg/dL Final  . Hgb A1c MFr Bld 03/08/2020 6.5   4.6 - 6.5 % Final   Glycemic Control Guidelines for People with Diabetes:Non Diabetic:  <6%Goal of Therapy: <7%Additional Action Suggested:  >8%      Allergies as of 03/11/2020      Reactions   Hydralazine Hcl Itching, Other (See Comments)   ENTIRE BODY = burning sensation, also in the eyes (they have become very sensitive to light)   Atorvastatin Other (See Comments)   Per MD      Medication List       Accurate as of March 11, 2020  1:52 PM. If you have any questions, ask your nurse or doctor.        amLODipine 10 MG tablet Commonly known as: NORVASC Take 1 tablet (10 mg total) by mouth daily.   ASPIRIN 81 PO Take by mouth daily.   carvedilol 25 MG tablet Commonly known as: COREG Take 25 mg by mouth 2 (two) times daily with a meal.   CINNAMON PO Take 1 capsule by mouth daily with breakfast.   glucose blood test strip Commonly known as: ONE TOUCH ULTRA TEST Use to check blood sugar 3 times daily Dx code E11.65   OneTouch Verio test strip Generic drug: glucose blood CHECK BLOOD SUGAR THREE TIMES DAILY   indapamide 2.5 MG tablet Commonly known as: LOZOL Take 2.5 mg by mouth daily.   lisinopril 40 MG tablet Commonly known as: ZESTRIL Take 1 tablet (40 mg total) by mouth daily.   Multivitamin Gummies Adult Chew Chew 1 tablet by mouth daily with breakfast.   rosuvastatin 10 MG tablet Commonly known as: CRESTOR TAKE 1 TABLET(10 MG) BY MOUTH DAILY   Trulicity 3.50 KX/3.8HW Sopn Generic drug: Dulaglutide ADMINISTER 0.75 MG UNDER THE SKIN EVERY SUNDAY   TURMERIC PO Take 1  capsule by mouth daily with breakfast.   Vitamin D3 25 MCG (1000 UT) Caps Take 2,000 Units by mouth daily with breakfast.   VITAMIN E PO Take 1 capsule by mouth daily with breakfast.       Allergies:  Allergies  Allergen Reactions  . Hydralazine Hcl Itching and Other (See Comments)    ENTIRE BODY = burning sensation, also in the eyes (they have become very sensitive to light)  .  Atorvastatin Other (See Comments)    Per MD    Past Medical History:  Diagnosis Date  . AKI (acute kidney injury) ([place]) 09/09/2013  . Allergy   . Altered mental status 01/17/2014  . Anemia   . Bronchitis 09/07/2013  . CAD (coronary artery disease)    non-obstructive by LHC (09/25/2013): Proximal and mid LAD serial 20%, proximal circumflex 30%, mid AV groove circumflex 30%, mid RCA mild plaque.  . Cancer ([place])   . Cardiomyopathy, hypertensive ([place]) 09/18/2013  . Carotid stenosis    a. Carotid US (09/26/2011): Bilateral 1-39% ICA.  Marland Kitchen Chest pain 11/27/2018  . Chronic combined systolic and diastolic CHF (congestive heart failure) ([place])   . CKD (chronic kidney disease)   . CKD (chronic kidney disease), stage III 09/24/2013   Pt at [facility], Dr. Joelyn Oms  . Diabetes mellitus without complication ([place])   . Edema 09/14/2013  . GERD (gastroesophageal reflux disease)   . History of ovarian cancer   . Hyperlipidemia   . Hypertension   . Hypertensive urgency 09/18/2013  . NICM (nonischemic cardiomyopathy) ([place])    a. Echocardiogram (09/08/2013): Mild LVH, EF 30-35%, no WMA, mild LAE.  Marland Kitchen Obesity (BMI 30-39.9)   . Other primary cardiomyopathies 10/26/2013  . Renal artery stenosis ([place]) 10/11/2018  . Thyroid disease    Seen by specialist    Past Surgical History:  Procedure Laterality Date  . LEFT HEART CATHETERIZATION WITH CORONARY ANGIOGRAM N/A 09/25/2013   Procedure: LEFT HEART CATHETERIZATION WITH CORONARY ANGIOGRAM;  Surgeon: Burnell Blanks, MD;  Location: [facility] CATH LAB;  Service: Cardiovascular;  Laterality: N/A;  . RENAL ANGIOGRAPHY N/A 10/06/2018   Procedure: RENAL ANGIOGRAPHY;  Surgeon: Marty Heck, MD;  Location: [facility] CV LAB;  Service: Cardiovascular;  Laterality: N/A;    Family History  Problem Relation Age of Onset  . Diabetes Father   . Hypertension Father   . Hypertension Maternal Grandfather     Social History:  reports that she has never smoked. She has  never used smokeless tobacco. She reports that she does not drink alcohol and does not use drugs.    Review of Systems    Thyroid: She has a 2.8 cm mixed echogenic predominantly solid nodule on the right side and a biopsy was benign   Thyroid levels have been normal  Lab Results  Component Value Date   TSH 1.61 05/04/2018   TSH 1.66 12/21/2016   TSH 1.07 12/20/2014         Lipids:  had been treated with Simvastatin 40 mg by PCP previously with good control but she thinks her medication was stopped when she was last hospitalized and she was told not to take it for unknown reasons  She has not taken her Crestor because of fear of side effects and being on multiple medications already  Her LDL is still relatively better than previous level of 181      Lab Results  Component Value Date   CHOL 281 (H) 03/08/2020   HDL 53.30 03/08/2020  LDLCALC 192 (H) 03/08/2020   LDLDIRECT 111.0 09/20/2019   TRIG 181.0 (H) 03/08/2020   CHOLHDL 5 03/08/2020                   The blood pressure has been treated with carvedilol, amlodipine 10 mg and lisinopril 5mg . Is seen by nephrologist   Renal artery ultrasound done in 2016 showed the following: Previously had hemodynamically significant stenosis involving the origin and mid aspects of the left renal artery. She has had a vascular stent placed  Appears that she is going to get a 24-hour blood pressure monitor study done  BP Readings from Last 3 Encounters:  03/11/20 (!) 164/82  09/25/19 (!) 144/74  12/15/18 121/80     Lab Results  Component Value Date   CREATININE 2.31 (H) 03/08/2020   CREATININE 2.24 (H) 09/20/2019   CREATININE 2.51 (H) 04/12/2019    Baseline vitamin D level has been low, on vitamin D3, 2000 units daily  Lab Results  Component Value Date   VD25OH 40.97 04/12/2019   VD25OH 34.89 01/21/2018   Last foot exam: 08/2018   LABS:  Lab on 03/08/2020  Component Date Value Ref Range Status  . Cholesterol  03/08/2020 281* 0 - 200 mg/dL Final   ATP III Classification       Desirable:  < 200 mg/dL               Borderline High:  200 - 239 mg/dL          High:  > = 240 mg/dL  . Triglycerides 03/08/2020 181.0* 0 - 149 mg/dL Final   Normal:  <150 mg/dLBorderline High:  150 - 199 mg/dL  . HDL 03/08/2020 53.30  >39.00 mg/dL Final  . VLDL 03/08/2020 36.2  0.0 - 40.0 mg/dL Final  . LDL Cholesterol 03/08/2020 192* 0 - 99 mg/dL Final  . Total CHOL/HDL Ratio 03/08/2020 5   Final                  Men          Women1/2 Average Risk     3.4          3.3Average Risk          5.0          4.42X Average Risk          9.6          7.13X Average Risk          15.0          11.0                      . NonHDL 03/08/2020 227.97   Final   NOTE:  Non-HDL goal should be 30 mg/dL higher than patient's LDL goal (i.e. LDL goal of < 70 mg/dL, would have non-HDL goal of < 100 mg/dL)  . Sodium 03/08/2020 137  135 - 145 mEq/L Final  . Potassium 03/08/2020 4.6  3.5 - 5.1 mEq/L Final  . Chloride 03/08/2020 103  96 - 112 mEq/L Final  . CO2 03/08/2020 27  19 - 32 mEq/L Final  . Glucose, Bld 03/08/2020 169* 70 - 99 mg/dL Final  . BUN 03/08/2020 36* 6 - 23 mg/dL Final  . Creatinine, Ser 03/08/2020 2.31* 0.40 - 1.20 mg/dL Final  . GFR 03/08/2020 25.37* >60.00 mL/min Final  . Calcium 03/08/2020 9.1  8.4 - 10.5 mg/dL Final  . Hgb A1c MFr Bld 03/08/2020  6.5  4.6 - 6.5 % Final   Glycemic Control Guidelines for People with Diabetes:Non Diabetic:  <6%Goal of Therapy: <7%Additional Action Suggested:  >8%     Physical Examination:  BP (!) 164/82 (BP Location: Left Arm, Patient Position: Sitting, Cuff Size: Large)   Pulse 74   Ht 5\' 6"  (1.676 m)   Wt 211 lb 12.8 oz (96.1 kg)   SpO2 98%   BMI 34.19 kg/m      ASSESSMENT:  Diabetes type 2, non-insulin-dependent See history of present illness for discussion of current diabetes management, blood sugar patterns and problems identified  Her A1c is excellent at 6.5  Currently on  monotherapy with 0.75 mg weekly dose of Trulicity  Even with her not exercising regularly or consistently watching her diet she has fairly good control with low-dose Trulicity She has cut back on high fat foods however and weight has leveled off  For now we will continue same dose of Trulicity, consider increasing the dose if she has any weight gain  HYPERTENSION, renal dysfunction and history of renovascular disease: Followed by nephrologist Blood pressure still relatively high and is going to get evaluation from the nephrologist  HYPERCHOLESTEROLEMIA: Her LDL is significantly high and she is afraid to take rosuvastatin because of fear of side effects   PLAN:  As above She will continue 1.49 mg Trulicity Encourage her to increase walking Reassured her that Crestor is safe for her to use and since it will prevent serious complications with cardiovascular events and plaque buildup she should take it, patient information brochure on Crestor given and she appears to understand the need to do this better now  Prescription for 10 mg Crestor sent again and will recheck lipids on the next visit   There are no Patient Instructions on file for this visit.     03/11/2020, 1:52 PM   Note: This office note was prepared with Dragon voice recognition system technology. Any transcriptional errors that result from this process are unintentional.

## 2020-03-12 ENCOUNTER — Telehealth: Payer: Self-pay | Admitting: Endocrinology

## 2020-03-12 NOTE — Telephone Encounter (Signed)
She has to get this done through her nephrologist who is managing her blood pressure

## 2020-03-12 NOTE — Telephone Encounter (Signed)
Medication Rx Request  Did you call your pharmacy and request this refill first? Yes  . If patient has not contacted pharmacy first, instruct them to do so for future refills.  . Remind them that contacting the pharmacy for their refill is the quickest method to get the refill.  . Refill policy also stated that it will take anywhere between 24-72 hours to receive the refill.    Name of medication? amlodipine and carvedilol - patient asked if dr Dwyane Dee could fill these because she has not seen the prescribing dr in a year and they declined refilling.  Is this a 90 day supply? 30 day  Name and location of pharmacy?  [id number] [address], [address] AT [facility] [address] Phone:  [phone]  Fax:  [phone]

## 2020-03-12 NOTE — Telephone Encounter (Signed)
Called pt and informed her of Dr. Ronnie Derby message. Pt verbalized understanding of this and stated that she had made attempts to reach Dr. Adin Hector office and was unable to reach anyone. Pt did request that this refill request be forwarded to Dr. Joelyn Oms if possible.

## 2020-03-12 NOTE — Telephone Encounter (Signed)
We can forward the requests to Dr. Joelyn Oms and/or she can ask [facility] to do so

## 2020-03-13 NOTE — Telephone Encounter (Signed)
The request was forwarded to Dr. Joelyn Oms as of yesterday per pt's request.

## 2020-04-13 ENCOUNTER — Other Ambulatory Visit: Payer: Self-pay | Admitting: Endocrinology

## 2020-04-24 DIAGNOSIS — N183 Chronic kidney disease, stage 3 unspecified: Secondary | ICD-10-CM | POA: Diagnosis not present

## 2020-04-24 DIAGNOSIS — R413 Other amnesia: Secondary | ICD-10-CM | POA: Diagnosis not present

## 2020-04-24 DIAGNOSIS — E785 Hyperlipidemia, unspecified: Secondary | ICD-10-CM | POA: Diagnosis not present

## 2020-04-24 DIAGNOSIS — R809 Proteinuria, unspecified: Secondary | ICD-10-CM | POA: Diagnosis not present

## 2020-04-24 DIAGNOSIS — N184 Chronic kidney disease, stage 4 (severe): Secondary | ICD-10-CM | POA: Diagnosis not present

## 2020-04-24 DIAGNOSIS — I10 Essential (primary) hypertension: Secondary | ICD-10-CM | POA: Diagnosis not present

## 2020-04-24 DIAGNOSIS — E1122 Type 2 diabetes mellitus with diabetic chronic kidney disease: Secondary | ICD-10-CM | POA: Diagnosis not present

## 2020-04-25 DIAGNOSIS — N184 Chronic kidney disease, stage 4 (severe): Secondary | ICD-10-CM | POA: Diagnosis not present

## 2020-04-25 DIAGNOSIS — N250 Renal osteodystrophy: Secondary | ICD-10-CM | POA: Diagnosis not present

## 2020-04-25 DIAGNOSIS — I10 Essential (primary) hypertension: Secondary | ICD-10-CM | POA: Diagnosis not present

## 2020-04-25 DIAGNOSIS — E1122 Type 2 diabetes mellitus with diabetic chronic kidney disease: Secondary | ICD-10-CM | POA: Diagnosis not present

## 2020-04-25 DIAGNOSIS — D649 Anemia, unspecified: Secondary | ICD-10-CM | POA: Diagnosis not present

## 2020-05-01 DIAGNOSIS — I10 Essential (primary) hypertension: Secondary | ICD-10-CM | POA: Diagnosis not present

## 2020-05-01 DIAGNOSIS — N184 Chronic kidney disease, stage 4 (severe): Secondary | ICD-10-CM | POA: Diagnosis not present

## 2020-05-01 DIAGNOSIS — E785 Hyperlipidemia, unspecified: Secondary | ICD-10-CM | POA: Diagnosis not present

## 2020-05-01 DIAGNOSIS — E1122 Type 2 diabetes mellitus with diabetic chronic kidney disease: Secondary | ICD-10-CM | POA: Diagnosis not present

## 2020-05-01 DIAGNOSIS — R0989 Other specified symptoms and signs involving the circulatory and respiratory systems: Secondary | ICD-10-CM | POA: Diagnosis not present

## 2020-05-16 DIAGNOSIS — R0989 Other specified symptoms and signs involving the circulatory and respiratory systems: Secondary | ICD-10-CM | POA: Diagnosis not present

## 2020-05-31 DIAGNOSIS — Z48812 Encounter for surgical aftercare following surgery on the circulatory system: Secondary | ICD-10-CM | POA: Diagnosis not present

## 2020-05-31 DIAGNOSIS — I701 Atherosclerosis of renal artery: Secondary | ICD-10-CM | POA: Diagnosis not present

## 2020-06-21 DIAGNOSIS — Z48812 Encounter for surgical aftercare following surgery on the circulatory system: Secondary | ICD-10-CM | POA: Diagnosis not present

## 2020-06-21 DIAGNOSIS — I701 Atherosclerosis of renal artery: Secondary | ICD-10-CM | POA: Diagnosis not present

## 2020-07-03 DIAGNOSIS — R413 Other amnesia: Secondary | ICD-10-CM | POA: Diagnosis not present

## 2020-07-03 DIAGNOSIS — R404 Transient alteration of awareness: Secondary | ICD-10-CM | POA: Diagnosis not present

## 2020-07-03 DIAGNOSIS — G4733 Obstructive sleep apnea (adult) (pediatric): Secondary | ICD-10-CM | POA: Diagnosis not present

## 2020-07-04 ENCOUNTER — Telehealth: Payer: Self-pay | Admitting: Endocrinology

## 2020-07-04 NOTE — Telephone Encounter (Signed)
Patient is no longer going to be seen in [facility] system because she is out of network.

## 2020-07-04 NOTE — Telephone Encounter (Signed)
FYI

## 2020-07-08 ENCOUNTER — Other Ambulatory Visit: Payer: Medicare PPO

## 2020-07-10 ENCOUNTER — Ambulatory Visit: Payer: Medicare PPO | Admitting: Endocrinology

## 2020-07-10 DIAGNOSIS — Z23 Encounter for immunization: Secondary | ICD-10-CM | POA: Diagnosis not present

## 2020-07-10 DIAGNOSIS — Z1231 Encounter for screening mammogram for malignant neoplasm of breast: Secondary | ICD-10-CM | POA: Diagnosis not present

## 2020-07-10 DIAGNOSIS — E1122 Type 2 diabetes mellitus with diabetic chronic kidney disease: Secondary | ICD-10-CM | POA: Diagnosis not present

## 2020-07-10 DIAGNOSIS — Z1239 Encounter for other screening for malignant neoplasm of breast: Secondary | ICD-10-CM | POA: Diagnosis not present

## 2020-07-10 DIAGNOSIS — E785 Hyperlipidemia, unspecified: Secondary | ICD-10-CM | POA: Diagnosis not present

## 2020-07-10 DIAGNOSIS — I10 Essential (primary) hypertension: Secondary | ICD-10-CM | POA: Diagnosis not present

## 2020-07-10 DIAGNOSIS — N184 Chronic kidney disease, stage 4 (severe): Secondary | ICD-10-CM | POA: Diagnosis not present

## 2020-07-23 ENCOUNTER — Ambulatory Visit: Payer: Medicare PPO | Attending: Internal Medicine

## 2020-07-23 ENCOUNTER — Other Ambulatory Visit (HOSPITAL_BASED_OUTPATIENT_CLINIC_OR_DEPARTMENT_OTHER): Payer: Self-pay | Admitting: Internal Medicine

## 2020-07-23 DIAGNOSIS — Z23 Encounter for immunization: Secondary | ICD-10-CM

## 2020-07-23 MED FILL — PFIZER-BIONTECH COVID-19 VA: 30 | 1 days supply | Qty: 0 | Fill #0

## 2020-07-23 NOTE — Progress Notes (Signed)
   Covid-19 Vaccination Clinic  Name:  Teresa Poole    MRN: [id number] DOB: June 08, 1951  07/23/2020  [name] was observed post Covid-19 immunization for 15 minutes without incident. She was provided with Vaccine Information Sheet and instruction to access the V-Safe system.   [name] was instructed to call 911 with any severe reactions post vaccine: Marland Kitchen Difficulty breathing  . Swelling of face and throat  . A fast heartbeat  . A bad rash all over body  . Dizziness and weakness   Immunizations Administered    Name Date Dose VIS Date Route   Pfizer COVID-19 Vaccine 07/23/2020 11:51 AM 0.3 mL 06/12/2020 Intramuscular   Manufacturer: [place]   Lot: [id number]   [place]: [id number]

## 2020-07-29 DIAGNOSIS — N184 Chronic kidney disease, stage 4 (severe): Secondary | ICD-10-CM | POA: Diagnosis not present

## 2020-07-29 DIAGNOSIS — E1122 Type 2 diabetes mellitus with diabetic chronic kidney disease: Secondary | ICD-10-CM | POA: Diagnosis not present

## 2020-07-29 DIAGNOSIS — I10 Essential (primary) hypertension: Secondary | ICD-10-CM | POA: Diagnosis not present

## 2020-07-29 DIAGNOSIS — N250 Renal osteodystrophy: Secondary | ICD-10-CM | POA: Diagnosis not present

## 2020-07-29 DIAGNOSIS — R928 Other abnormal and inconclusive findings on diagnostic imaging of breast: Secondary | ICD-10-CM | POA: Diagnosis not present

## 2020-07-29 DIAGNOSIS — D649 Anemia, unspecified: Secondary | ICD-10-CM | POA: Diagnosis not present

## 2020-12-11 DIAGNOSIS — E785 Hyperlipidemia, unspecified: Secondary | ICD-10-CM | POA: Diagnosis not present

## 2020-12-11 DIAGNOSIS — I10 Essential (primary) hypertension: Secondary | ICD-10-CM | POA: Diagnosis not present

## 2020-12-11 DIAGNOSIS — E1122 Type 2 diabetes mellitus with diabetic chronic kidney disease: Secondary | ICD-10-CM | POA: Diagnosis not present

## 2020-12-11 DIAGNOSIS — N184 Chronic kidney disease, stage 4 (severe): Secondary | ICD-10-CM | POA: Diagnosis not present

## 2020-12-11 DIAGNOSIS — Z23 Encounter for immunization: Secondary | ICD-10-CM | POA: Diagnosis not present

## 2020-12-18 DIAGNOSIS — H35412 Lattice degeneration of retina, left eye: Secondary | ICD-10-CM | POA: Diagnosis not present

## 2020-12-18 DIAGNOSIS — H25813 Combined forms of age-related cataract, bilateral: Secondary | ICD-10-CM | POA: Diagnosis not present

## 2020-12-18 DIAGNOSIS — H40053 Ocular hypertension, bilateral: Secondary | ICD-10-CM | POA: Diagnosis not present

## 2020-12-18 DIAGNOSIS — E113292 Type 2 diabetes mellitus with mild nonproliferative diabetic retinopathy without macular edema, left eye: Secondary | ICD-10-CM | POA: Diagnosis not present

## 2021-01-15 DIAGNOSIS — I10 Essential (primary) hypertension: Secondary | ICD-10-CM | POA: Diagnosis not present

## 2021-01-16 DIAGNOSIS — E1169 Type 2 diabetes mellitus with other specified complication: Secondary | ICD-10-CM | POA: Diagnosis not present

## 2021-01-16 DIAGNOSIS — E785 Hyperlipidemia, unspecified: Secondary | ICD-10-CM | POA: Diagnosis not present

## 2021-01-16 DIAGNOSIS — I10 Essential (primary) hypertension: Secondary | ICD-10-CM | POA: Diagnosis not present

## 2021-01-16 DIAGNOSIS — Z23 Encounter for immunization: Secondary | ICD-10-CM | POA: Diagnosis not present

## 2021-02-16 DIAGNOSIS — R21 Rash and other nonspecific skin eruption: Secondary | ICD-10-CM | POA: Diagnosis not present

## 2021-02-16 DIAGNOSIS — S80862A Insect bite (nonvenomous), left lower leg, initial encounter: Secondary | ICD-10-CM | POA: Diagnosis not present

## 2021-02-16 DIAGNOSIS — L299 Pruritus, unspecified: Secondary | ICD-10-CM | POA: Diagnosis not present

## 2021-03-14 DIAGNOSIS — I10 Essential (primary) hypertension: Secondary | ICD-10-CM | POA: Diagnosis not present

## 2021-03-14 DIAGNOSIS — E785 Hyperlipidemia, unspecified: Secondary | ICD-10-CM | POA: Diagnosis not present

## 2021-03-14 DIAGNOSIS — L299 Pruritus, unspecified: Secondary | ICD-10-CM | POA: Diagnosis not present

## 2021-03-14 DIAGNOSIS — E1122 Type 2 diabetes mellitus with diabetic chronic kidney disease: Secondary | ICD-10-CM | POA: Diagnosis not present

## 2021-03-14 DIAGNOSIS — N184 Chronic kidney disease, stage 4 (severe): Secondary | ICD-10-CM | POA: Diagnosis not present

## 2021-06-02 ENCOUNTER — Emergency Department (HOSPITAL_COMMUNITY)
Admission: EM | Admit: 2021-06-02 | Discharge: 2021-06-03 | Disposition: A | Discharge status: Home / Self Care | Payer: Medicare PPO | Attending: Emergency Medicine | Admitting: Emergency Medicine

## 2021-06-02 ENCOUNTER — Encounter (HOSPITAL_COMMUNITY): Payer: Self-pay

## 2021-06-02 ENCOUNTER — Other Ambulatory Visit: Payer: Self-pay

## 2021-06-02 DIAGNOSIS — Z5321 Procedure and treatment not carried out due to patient leaving prior to being seen by health care provider: Secondary | ICD-10-CM | POA: Insufficient documentation

## 2021-06-02 DIAGNOSIS — R208 Other disturbances of skin sensation: Secondary | ICD-10-CM | POA: Diagnosis not present

## 2021-06-02 DIAGNOSIS — M549 Dorsalgia, unspecified: Secondary | ICD-10-CM | POA: Diagnosis present

## 2021-06-02 DIAGNOSIS — R109 Unspecified abdominal pain: Secondary | ICD-10-CM | POA: Diagnosis not present

## 2021-06-02 DIAGNOSIS — R2681 Unsteadiness on feet: Secondary | ICD-10-CM | POA: Insufficient documentation

## 2021-06-02 LAB — CBC
HCT: 30 % — ABNORMAL LOW (ref 36.0–46.0)
Hemoglobin: 9.9 g/dL — ABNORMAL LOW (ref 12.0–15.0)
MCH: 30.1 pg (ref 26.0–34.0)
MCHC: 33 g/dL (ref 30.0–36.0)
MCV: 91.2 fL (ref 80.0–100.0)
Platelets: 230 10*3/uL (ref 150–400)
RBC: 3.29 MIL/uL — ABNORMAL LOW (ref 3.87–5.11)
RDW: 14 % (ref 11.5–15.5)
WBC: 6.2 10*3/uL (ref 4.0–10.5)
nRBC: 0 % (ref 0.0–0.2)

## 2021-06-02 LAB — COMPREHENSIVE METABOLIC PANEL
ALT: 11 U/L (ref 0–44)
AST: 16 U/L (ref 15–41)
Albumin: 3.5 g/dL (ref 3.5–5.0)
Alkaline Phosphatase: 38 U/L (ref 38–126)
Anion gap: 8 (ref 5–15)
BUN: 37 mg/dL — ABNORMAL HIGH (ref 8–23)
CO2: 25 mmol/L (ref 22–32)
Calcium: 9.2 mg/dL (ref 8.9–10.3)
Chloride: 104 mmol/L (ref 98–111)
Creatinine, Ser: 2.63 mg/dL — ABNORMAL HIGH (ref 0.44–1.00)
GFR, Estimated: 19 mL/min — ABNORMAL LOW (ref >60)
Glucose, Bld: 133 mg/dL — ABNORMAL HIGH (ref 70–99)
Potassium: 4.9 mmol/L (ref 3.5–5.1)
Sodium: 137 mmol/L (ref 135–145)
Total Bilirubin: 0.6 mg/dL (ref 0.3–1.2)
Total Protein: 6.6 g/dL (ref 6.5–8.1)

## 2021-06-02 LAB — URINALYSIS, ROUTINE W REFLEX MICROSCOPIC
Bacteria, UA: NONE SEEN
Bilirubin Urine: NEGATIVE
Glucose, UA: NEGATIVE mg/dL
Hgb urine dipstick: NEGATIVE
Ketones, ur: NEGATIVE mg/dL
Nitrite: NEGATIVE
Protein, ur: 30 mg/dL — AB
Specific Gravity, Urine: 1.006 (ref 1.005–1.030)
pH: 6 (ref 5.0–8.0)

## 2021-06-02 LAB — LIPASE, BLOOD: Lipase: 49 U/L (ref 11–51)

## 2021-06-02 NOTE — ED Triage Notes (Signed)
Pt reports burning all over, pain in her back and stomach. Pt also reports her gait is unsteady. States "my tears are like sulfuric acid". Pt taking antacid without relief.

## 2021-06-03 NOTE — ED Notes (Signed)
Pt called for VS, no response.  °

## 2021-06-09 ENCOUNTER — Encounter (HOSPITAL_BASED_OUTPATIENT_CLINIC_OR_DEPARTMENT_OTHER): Payer: Self-pay

## 2021-06-09 ENCOUNTER — Emergency Department (HOSPITAL_BASED_OUTPATIENT_CLINIC_OR_DEPARTMENT_OTHER): Payer: Medicare PPO

## 2021-06-09 ENCOUNTER — Other Ambulatory Visit: Payer: Self-pay

## 2021-06-09 ENCOUNTER — Observation Stay (HOSPITAL_BASED_OUTPATIENT_CLINIC_OR_DEPARTMENT_OTHER)
Admission: EM | Admit: 2021-06-09 | Discharge: 2021-06-11 | Disposition: A | Discharge status: Home / Self Care | Payer: Medicare PPO | Attending: Internal Medicine | Admitting: Internal Medicine

## 2021-06-09 DIAGNOSIS — I2510 Atherosclerotic heart disease of native coronary artery without angina pectoris: Secondary | ICD-10-CM | POA: Insufficient documentation

## 2021-06-09 DIAGNOSIS — I130 Hypertensive heart and chronic kidney disease with heart failure and stage 1 through stage 4 chronic kidney disease, or unspecified chronic kidney disease: Secondary | ICD-10-CM | POA: Diagnosis not present

## 2021-06-09 DIAGNOSIS — R2681 Unsteadiness on feet: Secondary | ICD-10-CM | POA: Diagnosis not present

## 2021-06-09 DIAGNOSIS — I5022 Chronic systolic (congestive) heart failure: Secondary | ICD-10-CM | POA: Diagnosis present

## 2021-06-09 DIAGNOSIS — R778 Other specified abnormalities of plasma proteins: Secondary | ICD-10-CM

## 2021-06-09 DIAGNOSIS — E039 Hypothyroidism, unspecified: Secondary | ICD-10-CM | POA: Diagnosis not present

## 2021-06-09 DIAGNOSIS — E1122 Type 2 diabetes mellitus with diabetic chronic kidney disease: Secondary | ICD-10-CM | POA: Diagnosis not present

## 2021-06-09 DIAGNOSIS — R519 Headache, unspecified: Secondary | ICD-10-CM | POA: Diagnosis present

## 2021-06-09 DIAGNOSIS — E871 Hypo-osmolality and hyponatremia: Secondary | ICD-10-CM | POA: Diagnosis not present

## 2021-06-09 DIAGNOSIS — Z8543 Personal history of malignant neoplasm of ovary: Secondary | ICD-10-CM | POA: Insufficient documentation

## 2021-06-09 DIAGNOSIS — D631 Anemia in chronic kidney disease: Secondary | ICD-10-CM | POA: Diagnosis present

## 2021-06-09 DIAGNOSIS — R0789 Other chest pain: Secondary | ICD-10-CM | POA: Diagnosis present

## 2021-06-09 DIAGNOSIS — F4322 Adjustment disorder with anxiety: Secondary | ICD-10-CM

## 2021-06-09 DIAGNOSIS — Z7982 Long term (current) use of aspirin: Secondary | ICD-10-CM | POA: Diagnosis not present

## 2021-06-09 DIAGNOSIS — N184 Chronic kidney disease, stage 4 (severe): Secondary | ICD-10-CM | POA: Diagnosis not present

## 2021-06-09 DIAGNOSIS — E782 Mixed hyperlipidemia: Secondary | ICD-10-CM | POA: Diagnosis present

## 2021-06-09 DIAGNOSIS — R079 Chest pain, unspecified: Secondary | ICD-10-CM | POA: Diagnosis present

## 2021-06-09 DIAGNOSIS — Z20822 Contact with and (suspected) exposure to covid-19: Secondary | ICD-10-CM | POA: Insufficient documentation

## 2021-06-09 DIAGNOSIS — I10 Essential (primary) hypertension: Secondary | ICD-10-CM | POA: Diagnosis present

## 2021-06-09 DIAGNOSIS — E1169 Type 2 diabetes mellitus with other specified complication: Secondary | ICD-10-CM | POA: Diagnosis present

## 2021-06-09 DIAGNOSIS — Z79899 Other long term (current) drug therapy: Secondary | ICD-10-CM | POA: Insufficient documentation

## 2021-06-09 DIAGNOSIS — Z7984 Long term (current) use of oral hypoglycemic drugs: Secondary | ICD-10-CM | POA: Insufficient documentation

## 2021-06-09 DIAGNOSIS — I5042 Chronic combined systolic (congestive) and diastolic (congestive) heart failure: Secondary | ICD-10-CM | POA: Diagnosis not present

## 2021-06-09 DIAGNOSIS — R52 Pain, unspecified: Secondary | ICD-10-CM

## 2021-06-09 DIAGNOSIS — I152 Hypertension secondary to endocrine disorders: Secondary | ICD-10-CM | POA: Diagnosis present

## 2021-06-09 LAB — BASIC METABOLIC PANEL
Anion gap: 12 (ref 5–15)
BUN: 46 mg/dL — ABNORMAL HIGH (ref 8–23)
CO2: 21 mmol/L — ABNORMAL LOW (ref 22–32)
Calcium: 9 mg/dL (ref 8.9–10.3)
Chloride: 94 mmol/L — ABNORMAL LOW (ref 98–111)
Creatinine, Ser: 2.63 mg/dL — ABNORMAL HIGH (ref 0.44–1.00)
GFR, Estimated: 19 mL/min — ABNORMAL LOW (ref >60)
Glucose, Bld: 146 mg/dL — ABNORMAL HIGH (ref 70–99)
Potassium: 3.8 mmol/L (ref 3.5–5.1)
Sodium: 127 mmol/L — ABNORMAL LOW (ref 135–145)

## 2021-06-09 LAB — CBC
HCT: 31.1 % — ABNORMAL LOW (ref 36.0–46.0)
Hemoglobin: 11 g/dL — ABNORMAL LOW (ref 12.0–15.0)
MCH: 30.1 pg (ref 26.0–34.0)
MCHC: 35.4 g/dL (ref 30.0–36.0)
MCV: 85.2 fL (ref 80.0–100.0)
Platelets: 239 10*3/uL (ref 150–400)
RBC: 3.65 MIL/uL — ABNORMAL LOW (ref 3.87–5.11)
RDW: 13.3 % (ref 11.5–15.5)
WBC: 8.5 10*3/uL (ref 4.0–10.5)
nRBC: 0 % (ref 0.0–0.2)

## 2021-06-09 LAB — TROPONIN I (HIGH SENSITIVITY)
Troponin I (High Sensitivity): 17 ng/L (ref <18)
Troponin I (High Sensitivity): 23 ng/L — ABNORMAL HIGH (ref <18)

## 2021-06-09 MED ORDER — SODIUM CHLORIDE 0.9 % IV BOLUS
500.0000 mL | Freq: Once | INTRAVENOUS | Status: AC
  Administered 2021-06-09: 500 mL via INTRAVENOUS

## 2021-06-09 NOTE — ED Triage Notes (Signed)
Pt arrives via [facility] from home. EMS reports patient is c/o headache, states they are noncompliant with their home BP medications. A&OX 4 at baseline per family. Pt told EMS she was having burning all over her body.

## 2021-06-09 NOTE — ED Provider Notes (Signed)
[facility] EMERGENCY DEPARTMENT Provider Note   CSN: [id number] Arrival date & time: 06/09/21  1749     History Chief Complaint  Patient presents with   Headache    Teresa Poole is a 70 y.o. female.  HPI     70 year old female with a history of coronary artery disease, hypertensive cardiomyopathy, carotid stenosis, combined systolic and diastolic congestive heart failure, CKD, diabetes, hyperlipidemia, hypertension, renal artery stenosis, thyroid disease, who presents with concern for burning all over her body, burning through her gastrointestinal system, headache, nausea and fatigue.    Sufuric acid feeling, intense burning, a lot of gas and pressure in whole system Taking gas-ex pills simethicone, has been having burning pain for a while, back feels like it is on fire bilateral lower back Headache, has not had a headache in 22 years, feeling pressure in head and neck Vomited a few minutes ago, has not for 2 years Nausea every night for years History of a lot of urinations, but now not urinating much at all Feels like everything is on fire, burning throughout whole GI tract Had seen PCP about diarrhea, but reports not having regular BM now Drinks 2-3 bottles of water a day, drinks large amounts of unsweet tea as well  Now having severe fatigue, used to go to the park and walk around and talk to people, but for the last week walks 3 steps and feels tired. Past Medical History:  Diagnosis Date   AKI (acute kidney injury) ([place]) 09/09/2013   Allergy    Altered mental status 01/17/2014   Anemia    Bronchitis 09/07/2013   CAD (coronary artery disease)    non-obstructive by LHC (09/25/2013): Proximal and mid LAD serial 20%, proximal circumflex 30%, mid AV groove circumflex 30%, mid RCA mild plaque.   Cancer ([place])    Cardiomyopathy, hypertensive ([place]) 09/18/2013   Carotid stenosis    a. Carotid US (09/26/2011): Bilateral 1-39% ICA.   Chest pain 11/27/2018   Chronic  combined systolic and diastolic CHF (congestive heart failure) (HCC)    CKD (chronic kidney disease)    CKD (chronic kidney disease), stage III ([place]) 09/24/2013   Pt at [facility], Dr. Joelyn Oms   Diabetes mellitus without complication [facility])    Edema 09/14/2013   GERD (gastroesophageal reflux disease)    History of ovarian cancer    Hyperlipidemia    Hypertension    Hypertensive urgency 09/18/2013   NICM (nonischemic cardiomyopathy) ([place])    a. Echocardiogram (09/08/2013): Mild LVH, EF 30-35%, no WMA, mild LAE.   Obesity (BMI 30-39.9)    Other primary cardiomyopathies 10/26/2013   Renal artery stenosis ([place]) 10/11/2018   Thyroid disease    Seen by specialist    Patient Active Problem List   Diagnosis Date Noted   Chest pain 11/27/2018   Renal artery stenosis ([place]) 10/11/2018   Mixed hyperlipidemia 10/26/2013   Chronic combined systolic and diastolic heart failure ([place]) 09/28/2013   CKD (chronic kidney disease), stage III ([place]) 09/24/2013   Hypertensive urgency 09/18/2013   Cardiomyopathy, hypertensive ([place]) 09/18/2013   Edema 09/14/2013   Acute combined systolic and diastolic congestive heart failure ([place]) 09/09/2013   Hypertensive heart disease with CHF (congestive heart failure) ([place]) 09/09/2013   Uncontrolled hypertension 09/07/2013   Diabetes mellitus type II, uncontrolled 09/07/2013   Obesity (BMI 30-39.9)    History of ovarian cancer     Past Surgical History:  Procedure Laterality Date   LEFT HEART CATHETERIZATION WITH CORONARY ANGIOGRAM N/A  09/25/2013   Procedure: LEFT HEART CATHETERIZATION WITH CORONARY ANGIOGRAM;  Surgeon: Burnell Blanks, MD;  Location: [facility] CATH LAB;  Service: Cardiovascular;  Laterality: N/A;   RENAL ANGIOGRAPHY N/A 10/06/2018   Procedure: RENAL ANGIOGRAPHY;  Surgeon: Marty Heck, MD;  Location: [facility] CV LAB;  Service: Cardiovascular;  Laterality: N/A;     OB History   No obstetric history on file.     Family History   Problem Relation Age of Onset   Diabetes Father    Hypertension Father    Hypertension Maternal Grandfather     Social History   Tobacco Use   Smoking status: Never   Smokeless tobacco: Never  Substance Use Topics   Alcohol use: No   Drug use: No    Home Medications Prior to Admission medications   Medication Sig Start Date End Date Taking? Authorizing Provider  amLODipine (NORVASC) 10 MG tablet Take 1 tablet (10 mg total) by mouth daily. 10/10/18   Copland, Gay Filler, MD  ASPIRIN 81 PO Take by mouth daily.    [provider]  carvedilol (COREG) 25 MG tablet Take 25 mg by mouth 2 (two) times daily with a meal.    [provider]  Cholecalciferol (VITAMIN D3) 25 MCG (1000 UT) CAPS Take 2,000 Units by mouth daily with breakfast. Patient not taking: Reported on 03/11/2020    [provider]  CINNAMON PO Take 1 capsule by mouth daily with breakfast.    [provider]  COVID-19 mRNA vaccine, Pfizer, 30 MCG/0.3ML injection INJECT AS DIRECTED 07/23/20 07/23/21  Carlyle Basques, MD  glucose blood (ONE TOUCH ULTRA TEST) test strip Use to check blood sugar 3 times daily Dx code E11.65 11/20/14   Elayne Snare, MD  indapamide (LOZOL) 2.5 MG tablet Take 2.5 mg by mouth daily. Patient not taking: Reported on 03/11/2020    [provider]  lisinopril (PRINIVIL,ZESTRIL) 40 MG tablet Take 1 tablet (40 mg total) by mouth daily. 10/10/18   Copland, Gay Filler, MD  Multiple Vitamins-Minerals (MULTIVITAMIN GUMMIES ADULT) CHEW Chew 1 tablet by mouth daily with breakfast.    [provider]  [facility] VERIO test strip CHECK BLOOD SUGAR THREE TIMES DAILY 03/30/18   Elayne Snare, MD  rosuvastatin (CRESTOR) 10 MG tablet TAKE 1 TABLET(10 MG) BY MOUTH DAILY 03/11/20   Elayne Snare, MD  TRULICITY 2.95 MW/4.1LK SOPN ADMINISTER 0.75 MG UNDER THE SKIN EVERY SUNDAY 04/14/20   Elayne Snare, MD  TURMERIC PO Take 1 capsule by mouth daily with breakfast.    [provider]  VITAMIN E PO Take 1 capsule by mouth daily with breakfast.    [provider]    Allergies    Hydralazine hcl and Atorvastatin  Review of Systems   Review of Systems  Constitutional:  Positive for fatigue. Negative for fever.  HENT:  Negative for sore throat.   Eyes:  Positive for pain (burning). Negative for visual disturbance.  Respiratory:  Negative for cough and shortness of breath.   Cardiovascular:  Positive for chest pain.  Gastrointestinal:  Positive for abdominal pain (burning), constipation, nausea and vomiting. Negative for diarrhea (denies on my history, had stated to PCP).  Genitourinary:  Positive for decreased urine volume and flank pain. Negative for difficulty urinating.  Musculoskeletal:  Negative for back pain and neck pain.  Skin:  Negative for rash.  Neurological:  Positive for headaches. Negative for syncope.   Physical Exam Updated Vital Signs BP (!) 162/55   Pulse  90   Temp 98.2 F (36.8 C) (Oral)   Resp 15   Ht 5\' 6"  (1.676 m)   Wt 77.1 kg   SpO2 99%   BMI 27.44 kg/m   Physical Exam Vitals and nursing note reviewed.  Constitutional:      General: She is not in acute distress.    Appearance: She is well-developed. She is not diaphoretic.  HENT:     Head: Normocephalic and atraumatic.  Eyes:     Conjunctiva/sclera: Conjunctivae normal.  Cardiovascular:     Rate and Rhythm: Normal rate and regular rhythm.     Heart sounds: Normal heart sounds. No murmur heard.   No friction rub. No gallop.  Pulmonary:     Effort: Pulmonary effort is normal. No respiratory distress.     Breath sounds: Normal breath sounds. No wheezing or rales.  Abdominal:     General: There is no distension.     Palpations: Abdomen is soft.     Tenderness: There is no abdominal tenderness. There is no guarding.  Musculoskeletal:        General: No tenderness.     Cervical back: Normal range of motion.  Skin:    General: Skin is warm and dry.      Findings: No erythema or rash.  Neurological:     Mental Status: She is alert and oriented to person, place, and time.    ED Results / Procedures / Treatments   Labs (all labs ordered are listed, but only abnormal results are displayed) Labs Reviewed  BASIC METABOLIC PANEL - Abnormal; Notable for the following components:      Result Value   Sodium 127 (*)    Chloride 94 (*)    CO2 21 (*)    Glucose, Bld 146 (*)    BUN 46 (*)    Creatinine, Ser 2.63 (*)    GFR, Estimated 19 (*)    All other components within normal limits  CBC - Abnormal; Notable for the following components:   RBC 3.65 (*)    Hemoglobin 11.0 (*)    HCT 31.1 (*)    All other components within normal limits  TROPONIN I (HIGH SENSITIVITY) - Abnormal; Notable for the following components:   Troponin I (High Sensitivity) 23 (*)    All other components within normal limits  NA AND K (SODIUM & POTASSIUM), RAND UR  TROPONIN I (HIGH SENSITIVITY)    EKG EKG Interpretation  Date/Time:  Monday June 09 2021 18:22:43 EDT Ventricular Rate:  91 PR Interval:  196 QRS Duration: 146 QT Interval:  402 QTC Calculation: 494 R Axis:   17 Text Interpretation: Normal sinus rhythm Right bundle branch block Possible Lateral infarct , age undetermined T wave abnormality, consider inferior ischemia Abnormal ECG No significant change since last tracing Confirmed by Gareth Morgan [phone]) on 06/09/2021 10:26:01 PM  Radiology DG Chest 2 View  Result Date: 06/09/2021 CLINICAL DATA:  Chest pain EXAM: CHEST - 2 VIEW COMPARISON:  11/27/2018 FINDINGS: Heart and mediastinal contours are within normal limits. No focal opacities or effusions. No acute bony abnormality. Aortic atherosclerosis. IMPRESSION: No active cardiopulmonary disease. Electronically Signed   By: Rolm Baptise M.D.   On: 06/09/2021 19:17   CT Head Wo Contrast  Result Date: 06/09/2021 CLINICAL DATA:  Headache. EXAM: CT HEAD WITHOUT CONTRAST TECHNIQUE: Contiguous  axial images were obtained from the base of the skull through the vertex without intravenous contrast. COMPARISON:  CT head 10/04/2018. FINDINGS: Brain: No evidence  of acute infarction, hemorrhage, hydrocephalus, extra-axial collection or mass lesion/mass effect. Vascular: No hyperdense vessel or unexpected calcification. Skull: Normal. Negative for fracture or focal lesion. Sinuses/Orbits: No acute finding. Other: None. IMPRESSION: No acute intracranial abnormality. Electronically Signed   By: Ronney Asters M.D.   On: 06/09/2021 22:49    Procedures Procedures   Medications Ordered in ED Medications  sodium chloride 0.9 % bolus 500 mL (500 mLs Intravenous New Bag/Given 06/09/21 2359)    ED Course  I have reviewed the triage vital signs and the nursing notes.  Pertinent labs & imaging results that were available during my care of the patient were reviewed by me and considered in my medical decision making (see chart for details).    MDM Rules/Calculators/A&P                            70 year old female with a history of coronary artery disease, hypertensive cardiomyopathy, carotid stenosis, combined systolic and diastolic congestive heart failure, CKD, diabetes, hyperlipidemia, hypertension, renal artery stenosis, thyroid disease, who presents with concern for burning all over her body, burning through her gastrointestinal system, headache, nausea and fatigue.  Her labs are significant for a decrease in her sodium from 1 37-1 27 over the course of the last 7 days.  She otherwise has no significant anemia, creatinine is stable.  Initial troponin is 17 however on repeat it was 23.  Unclear etiology of hyponatremia.  She appears euvolemic on exam, my history does not describe any indication that she would have worsening congestive heart failure or dehydration as etiology of symptoms. Had reported diarrhea to PCP but denies on my history. No new medications, has been taking simethicone.    Given 500cc IV fluid, will admit for symptomatic hyponatremia given quick change in levels over last week without clear precipitating symptoms, and development of headache, nausea and vomiting which is new for patient.  CT head was completed which showed no evidence of acute abnormalities.      Final Clinical Impression(s) / ED Diagnoses Final diagnoses:  Hyponatremia  Burning pain  Elevated troponin    Rx / DC Orders ED Discharge Orders     None        Gareth Morgan, MD 06/10/21 [id number]

## 2021-06-09 NOTE — ED Notes (Addendum)
Pt vomited 550cc in emesis bag

## 2021-06-09 NOTE — ED Notes (Signed)
Pt states she is having "burning like she is in sulfuric acid all over body". Has a headache and unsteady on feet for 3 days. Also having indigestion and pain into chest. BIB EMS

## 2021-06-10 ENCOUNTER — Other Ambulatory Visit: Payer: Self-pay

## 2021-06-10 ENCOUNTER — Encounter (HOSPITAL_COMMUNITY): Payer: Self-pay | Admitting: Internal Medicine

## 2021-06-10 DIAGNOSIS — Z20822 Contact with and (suspected) exposure to covid-19: Secondary | ICD-10-CM | POA: Diagnosis not present

## 2021-06-10 DIAGNOSIS — E871 Hypo-osmolality and hyponatremia: Secondary | ICD-10-CM | POA: Diagnosis present

## 2021-06-10 DIAGNOSIS — R519 Headache, unspecified: Secondary | ICD-10-CM | POA: Diagnosis present

## 2021-06-10 DIAGNOSIS — E1122 Type 2 diabetes mellitus with diabetic chronic kidney disease: Secondary | ICD-10-CM | POA: Diagnosis not present

## 2021-06-10 DIAGNOSIS — I5042 Chronic combined systolic (congestive) and diastolic (congestive) heart failure: Secondary | ICD-10-CM | POA: Diagnosis not present

## 2021-06-10 DIAGNOSIS — I5022 Chronic systolic (congestive) heart failure: Secondary | ICD-10-CM

## 2021-06-10 DIAGNOSIS — R2681 Unsteadiness on feet: Secondary | ICD-10-CM | POA: Diagnosis not present

## 2021-06-10 DIAGNOSIS — I130 Hypertensive heart and chronic kidney disease with heart failure and stage 1 through stage 4 chronic kidney disease, or unspecified chronic kidney disease: Secondary | ICD-10-CM | POA: Diagnosis not present

## 2021-06-10 DIAGNOSIS — F4322 Adjustment disorder with anxiety: Secondary | ICD-10-CM | POA: Diagnosis not present

## 2021-06-10 DIAGNOSIS — R778 Other specified abnormalities of plasma proteins: Secondary | ICD-10-CM | POA: Diagnosis not present

## 2021-06-10 DIAGNOSIS — E039 Hypothyroidism, unspecified: Secondary | ICD-10-CM | POA: Diagnosis not present

## 2021-06-10 DIAGNOSIS — R079 Chest pain, unspecified: Secondary | ICD-10-CM

## 2021-06-10 DIAGNOSIS — Z7984 Long term (current) use of oral hypoglycemic drugs: Secondary | ICD-10-CM | POA: Diagnosis not present

## 2021-06-10 DIAGNOSIS — I10 Essential (primary) hypertension: Secondary | ICD-10-CM | POA: Diagnosis not present

## 2021-06-10 DIAGNOSIS — N184 Chronic kidney disease, stage 4 (severe): Secondary | ICD-10-CM

## 2021-06-10 DIAGNOSIS — I2510 Atherosclerotic heart disease of native coronary artery without angina pectoris: Secondary | ICD-10-CM | POA: Diagnosis not present

## 2021-06-10 DIAGNOSIS — Z8543 Personal history of malignant neoplasm of ovary: Secondary | ICD-10-CM | POA: Diagnosis not present

## 2021-06-10 DIAGNOSIS — Z7982 Long term (current) use of aspirin: Secondary | ICD-10-CM | POA: Diagnosis not present

## 2021-06-10 DIAGNOSIS — Z79899 Other long term (current) drug therapy: Secondary | ICD-10-CM | POA: Diagnosis not present

## 2021-06-10 LAB — TROPONIN I (HIGH SENSITIVITY)
Troponin I (High Sensitivity): 35 ng/L — ABNORMAL HIGH (ref <18)
Troponin I (High Sensitivity): 31 ng/L — ABNORMAL HIGH (ref <18)

## 2021-06-10 LAB — BASIC METABOLIC PANEL
Anion gap: 11 (ref 5–15)
BUN: 38 mg/dL — ABNORMAL HIGH (ref 8–23)
CO2: 20 mmol/L — ABNORMAL LOW (ref 22–32)
Calcium: 9.2 mg/dL (ref 8.9–10.3)
Chloride: 101 mmol/L (ref 98–111)
Creatinine, Ser: 2.48 mg/dL — ABNORMAL HIGH (ref 0.44–1.00)
GFR, Estimated: 21 mL/min — ABNORMAL LOW (ref >60)
Glucose, Bld: 151 mg/dL — ABNORMAL HIGH (ref 70–99)
Potassium: 4.3 mmol/L (ref 3.5–5.1)
Sodium: 132 mmol/L — ABNORMAL LOW (ref 135–145)

## 2021-06-10 LAB — NA AND K (SODIUM & POTASSIUM), RAND UR
Potassium Urine: 5 mmol/L
Sodium, Ur: 40 mmol/L

## 2021-06-10 LAB — GLUCOSE, CAPILLARY
Glucose-Capillary: 144 mg/dL — ABNORMAL HIGH (ref 70–99)
Glucose-Capillary: 126 mg/dL — ABNORMAL HIGH (ref 70–99)

## 2021-06-10 LAB — OSMOLALITY, URINE: Osmolality, Ur: 158 mOsm/kg — ABNORMAL LOW (ref 300–900)

## 2021-06-10 LAB — HIV ANTIBODY (ROUTINE TESTING W REFLEX): HIV Screen 4th Generation wRfx: NONREACTIVE

## 2021-06-10 LAB — CBG MONITORING, ED: Glucose-Capillary: 120 mg/dL — ABNORMAL HIGH (ref 70–99)

## 2021-06-10 LAB — RESP PANEL BY RT-PCR (FLU A&B, COVID) ARPGX2
Influenza A by PCR: NEGATIVE
Influenza B by PCR: NEGATIVE
SARS Coronavirus 2 by RT PCR: NEGATIVE

## 2021-06-10 LAB — VITAMIN B12: Vitamin B-12: 256 pg/mL (ref 180–914)

## 2021-06-10 LAB — OSMOLALITY: Osmolality: 287 mOsm/kg (ref 275–295)

## 2021-06-10 LAB — TSH: TSH: 1.332 u[IU]/mL (ref 0.350–4.500)

## 2021-06-10 MED ORDER — ROSUVASTATIN CALCIUM 5 MG PO TABS
10.0000 mg | ORAL_TABLET | Freq: Every day | ORAL | Status: DC
  Administered 2021-06-10: 10 mg via ORAL
  Filled 2021-06-10: qty 2

## 2021-06-10 MED ORDER — ROSUVASTATIN CALCIUM 20 MG PO TABS: 20.0000 mg | ORAL_TABLET | Freq: Every day | ORAL | Status: DC

## 2021-06-10 MED ORDER — ACETAMINOPHEN 325 MG PO TABS: 650.0000 mg | ORAL_TABLET | ORAL | Status: DC | PRN

## 2021-06-10 MED ORDER — CYCLOSPORINE 0.05 % OP EMUL
1.0000 [drp] | Freq: Two times a day (BID) | OPHTHALMIC | Status: DC
  Administered 2021-06-10 – 2021-06-11 (×3): 1 [drp] via OPHTHALMIC
  Filled 2021-06-10 (×4): qty 30

## 2021-06-10 MED ORDER — CYCLOSPORINE (PF) 0.09 % OP SOLN: 1.0000 [drp] | Freq: Two times a day (BID) | OPHTHALMIC | Status: DC

## 2021-06-10 MED ORDER — ENOXAPARIN SODIUM 30 MG/0.3ML IJ SOSY
30.0000 mg | PREFILLED_SYRINGE | INTRAMUSCULAR | Status: DC
  Administered 2021-06-10 – 2021-06-11 (×2): 30 mg via SUBCUTANEOUS
  Filled 2021-06-10 (×2): qty 0.3

## 2021-06-10 MED ORDER — ONDANSETRON HCL 4 MG/2ML IJ SOLN: 4.0000 mg | Freq: Four times a day (QID) | INTRAMUSCULAR | Status: DC | PRN

## 2021-06-10 MED ORDER — CARVEDILOL 25 MG PO TABS
25.0000 mg | ORAL_TABLET | Freq: Two times a day (BID) | ORAL | Status: DC
  Administered 2021-06-10 – 2021-06-11 (×3): 25 mg via ORAL
  Filled 2021-06-10 (×4): qty 1

## 2021-06-10 MED ORDER — PANTOPRAZOLE SODIUM 40 MG PO TBEC
40.0000 mg | DELAYED_RELEASE_TABLET | Freq: Every day | ORAL | Status: DC
  Administered 2021-06-10 – 2021-06-11 (×2): 40 mg via ORAL
  Filled 2021-06-10 (×2): qty 1

## 2021-06-10 MED ORDER — NIFEDIPINE ER OSMOTIC RELEASE 60 MG PO TB24
60.0000 mg | ORAL_TABLET | Freq: Two times a day (BID) | ORAL | Status: DC
  Administered 2021-06-10 – 2021-06-11 (×3): 60 mg via ORAL
  Filled 2021-06-10 (×4): qty 1

## 2021-06-10 NOTE — Consult Note (Signed)
Teresa Poole Psychiatry New Psychiatric Evaluation   Service Date: June 10, 2021 LOS:  LOS: 0 days    Assessment  Teresa Poole is a 70 y.o. female admitted medically for 06/09/2021 10:03 PM for unsteadiness on her feet. She carries no  psychiatric diagnoses and has a past medical history of  anemia, CAD, cardiomyopathy, CHF, CKD, diabetes, GERD, history of ovarian cancer, HLD, HTN, renal artery stenosis and thyroid disease. Psychiatry was consulted for panic attacks by Teresa Bongo, MD.   Her current presentation of increasing anxiety in the setting of declining health condition is most consistent with adjustment disorder with anxiety.She has never been on psychotropic medications. On initial examination, patient openly discussed recent medical and psychiatric symptoms. She was quite repetitive and did not seem to realize she was relaying the same information several times throughout interview; I am separately concerned for mild MCI which I have communicated with the primary team about; hyponatremia could certainly be clouding this picture. Please see plan below for detailed recommendations.   Diagnoses:  Active Hospital problems: Active Problems:   Hyponatremia    Problems edited/added by me: No problems updated.  Plan  ## Safety and Observation Level:  - Based on my clinical evaluation, I estimate the patient to be at low risk of self harm in the current setting - At this time, we recommend a routine level of observation. This decision is based on my review of the chart including patient's history and current presentation, interview of the patient, mental status examination, and consideration of suicide risk including evaluating suicidal ideation, plan, intent, suicidal or self-harm behaviors, risk factors, and protective factors. This judgment is based on our ability to directly address suicide risk, implement suicide prevention strategies and develop a safety plan while  the patient is in the clinical setting. Please contact our team if there is a concern that risk level has changed.   ## Medications:  -- none currently   -- feel risk>benefit in 70 y/o pt with a few days of anxiety symptoms   ## Medical Decision Making Capacity:  Not formally assessed  ## Further Work-up:  -- TSH, B12 -- could consider RPR -- continued workup of medical issues (cardiology to see pt later today)  -- consider w/u of carcinoid syndrome (would explain many of current's pt symptoms, is scheduled for outpt w/u) while inpt  -- consider MOCA or MMSE after sodium normalizes if pt persists with memory difficulties  ## Disposition:  -- per primary team  Thank you for this consult request. Recommendations have been communicated to the primary team.  We will continue to follow at this time; will likely see x1 later this week to track sx and sign off if no further episodes of panic or if panic episodes have been explained by medical w/u.   Teresa Poole    NEW history  Relevant Aspects of Hospital Course:  Admitted on 06/09/2021 for itching.  No recent fills of psychotropic medications.   Per chart review, pt was seen yesterday by Teresa Hun PA-C for a similar complaint - in summary reported several week history of external and internal burning of skin, eyes, GI tract, loose stool, and multiple recent episodes of full body sweating; plan at that time was to w/u carcinoid syndrome outpt. Had previously been following with same provider for unintentional 11 lb wt loss over 1 months.   Patient Report:  Pt seen in afternoon. She reports a similar HPI as seen in Dr.  Lorin Poole' H&P from this date; there are some minor differences which make me concerned pt is having some memory issues. To me she endorsed 1 panic attack while driving yesterday (stated it was mild anxiety) as opposed to the multiple panic attacks she had endorsed to prior providers. She emphasized the chest  pressure, tightness, and radiation more to me. She did not discuss the diarrhea with me until asked. To me she endorsed a 30-40 pound intentional weight loss over several years (endorsed unintentional 11 lb wt loss over 1 month to PA yesterday)  Overall pt was repetitive, and often returned to the same topics of conversation using nearly identical phrasing ie "sulfuric acid". This seemed to be more a function of poor memory than perseveration. She did not appear to be aware she was doing this. When asked about her memory she endorsed some concern - has had more difficulty with executive functioning tasks such as driving lately. She was A&Ox5 and did DOWB and MOYB without issue. 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   She shares many prior brushes with the mental health system and discusses how it has effected her and the African American community; while she has not personally seen a psychiatrist since the age of 71 (parents were concerned because she was a quiet child) she has had multiple family members, friends, coworkers, and cousins with mental health struggles. While she has never had suicidal thoughts, she does know who she would turn to and who she would see for counseling if the need arose. She does not own a gun.   ROS:  See HPI; pt is poor historian. Centers on themes of anxiety, chest pressure, and diarrhea over my interview.   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 Many relatives required pacemakers  Medical History: Past Medical History:  Diagnosis Date   Allergy    Anemia    CAD (coronary artery disease)    non-obstructive by LHC (09/25/2013): Proximal and mid LAD serial 20%, proximal circumflex 30%, mid AV groove circumflex 30%, mid RCA mild plaque.   Cardiomyopathy, hypertensive ([place]) 09/18/2013   Carotid stenosis    a. Carotid US (09/26/2011): Bilateral 1-39% ICA.   Chronic combined systolic and diastolic CHF (congestive heart failure) (HCC)    CKD (chronic kidney disease), stage III ([place]) 09/24/2013   Pt at [facility], Dr. Joelyn Oms   Diabetes mellitus without complication [facility])    Edema 09/14/2013   GERD (gastroesophageal reflux disease)    History of ovarian cancer    Hyperlipidemia    Hypertension    Obesity (BMI 30-39.9)    Other primary cardiomyopathies 10/26/2013   Renal artery stenosis ([place]) 10/11/2018   Thyroid disease    Seen by specialist    Surgical History: Past  Surgical History:  Procedure Laterality Date   LEFT HEART CATHETERIZATION WITH CORONARY ANGIOGRAM N/A 09/25/2013   Procedure: LEFT HEART CATHETERIZATION WITH CORONARY ANGIOGRAM;  Surgeon: Burnell Blanks, MD;  Location: [facility] CATH LAB;  Service: Cardiovascular;  Laterality: N/A;   RENAL ANGIOGRAPHY N/A 10/06/2018   Procedure: RENAL ANGIOGRAPHY;  Surgeon: Marty Heck, MD;  Location: [facility] CV LAB;  Service: Cardiovascular;  Laterality: N/A;    Medications:   Current Facility-Administered Medications:    acetaminophen (TYLENOL) tablet 650 mg, 650 mg, Oral, Q4H PRN, Teresa Bongo, MD   carvedilol (COREG) tablet 25 mg, 25 mg, Oral, BID, Teresa Bongo, MD   cycloSPORINE  (RESTASIS) 0.05 % ophthalmic emulsion 1 drop, 1 drop, Both Eyes, BID, Teresa Bongo, MD   enoxaparin (LOVENOX) injection 30 mg, 30 mg, Subcutaneous, Q24H, Teresa Bongo, MD   NIFEdipine (PROCARDIA XL/NIFEDICAL XL) 24 hr tablet 60 mg, 60 mg, Oral, BID, Teresa Bongo, MD   ondansetron [facility]) injection 4 mg, 4 mg, Intravenous, Q6H PRN, Teresa Bongo, MD   pantoprazole (PROTONIX) EC tablet 40 mg, 40 mg, Oral, Daily, Teresa Bongo, MD   rosuvastatin (CRESTOR) tablet 10 mg, 10 mg, Oral, QHS, Teresa Bongo, MD  Allergies: Allergies  Allergen Reactions   Hydralazine Hcl Itching and Other (See Comments)    ENTIRE BODY = burning sensation, also in the eyes (they have become very sensitive to light)   Atorvastatin Other (See Comments)    Per MD       Objective  Vital signs:  Temp:  [98.1 F (36.7 C)-98.9 F (37.2 C)] 98.1 F (36.7 C) (10/18 1103) Pulse Rate:  [74-92] 89 (10/18 1103) Resp:  [13-18] 18 (10/18 1103) BP: (120-196)/(55-94) 154/78 (10/18 1103) SpO2:  [90 %-100 %] 98 % (10/18 1103) Weight:  [75.8 kg-77.1 kg] 75.8 kg (10/18 0949)  Physical Exam: Gen: Sitting in chair by window, NAD Head: Normocephalic/atraumatic Pulm: On RA no increased WOB Psych: A&Ox4   Mental Status Exam: Appearance: Sitting in chair by window, well groomed  Attitude:  Open, cooperative, pleasant  Behavior/Psychomotor: No increased rate of gesturing, no psychomotor slowing   Speech/Language:  Repetitive, good vocabulary, no latency, normal volume  Mood: Worried (about her heart)  Affect: Generally congruent, laughs appropriately  Thought process: Repetitive.   Thought content:   Devoid of SI, HI  Perceptual disturbances:  Denies AH/VH  Attention: good  Concentration: good  Orientation: full  Memory: Recent poor, remote appears intact  Fund of knowledge:  good  Insight:   fair  Judgment:  fair  Impulse Control: good

## 2021-06-10 NOTE — ED Notes (Signed)
Patient given water and kind bar for breakfast.

## 2021-06-10 NOTE — Evaluation (Signed)
Physical Therapy Evaluation Patient Details Name: Teresa Poole MRN: [id number] DOB: 1951/02/19 Today's Date: 06/10/2021  History of Present Illness  Pt is 70 yo female who presented on 06/09/21 with concern of "burning all over her body, GI system, HA, nausea, and fatigue."  Pt found to have hyponatremia. Pt  with hx of coronary artery disease, hypertensive cardiomyopathy, carotid stenosis, combined systolic and diastolic congestive heart failure, CKD, diabetes, hyperlipidemia, hypertension, renal artery stenosis, thyroid disease  Clinical Impression  Pt admitted with above diagnosis.  She was mobilizing independently at arrival.  She demonstrated safe hallway ambulation and stairs.  Scored a 22/24 on the DGI with deficits occurring b/c used rail on steps and performed step to pattern.  VSS with mobility and no chest pain/pressure. Pt is at baseline mobility with no PT needs.        Recommendations for follow up therapy are one component of a multi-disciplinary discharge planning process, led by the attending physician.  Recommendations may be updated based on patient status, additional functional criteria and insurance authorization.  Follow Up Recommendations No PT follow up    Equipment Recommendations  None recommended by PT    Recommendations for Other Services       Precautions / Restrictions Precautions Precautions: None      Mobility  Bed Mobility Overal bed mobility: Independent                  Transfers Overall transfer level: Independent               General transfer comment: Pt up mobilizing at arrival.  Demonstrated sit to stand safely  Ambulation/Gait Ambulation/Gait assistance: Independent Gait Distance (Feet): 500 Feet Assistive device: None Gait Pattern/deviations: WFL(Within Functional Limits) Gait velocity: normal   General Gait Details: Pt was mobilizing in room at arrival.  Demonstrated hallway ambulation safely with PT.  States  "This is the first time in days I haven't felt that I was going to topple over."  Stairs Stairs: Yes Stairs assistance: Supervision Stair Management: One rail Left;Step to pattern;Forwards Number of Stairs: 12    Wheelchair Mobility    Modified Rankin (Stroke Patients Only)       Balance Overall balance assessment: Needs assistance   Sitting balance-Leahy Scale: Normal       Standing balance-Leahy Scale: Normal                   Standardized Balance Assessment Standardized Balance Assessment : Dynamic Gait Index   Dynamic Gait Index Level Surface: Normal Change in Gait Speed: Normal Gait with Horizontal Head Turns: Normal Gait with Vertical Head Turns: Normal Gait and Pivot Turn: Normal Step Over Obstacle: Normal Step Around Obstacles: Normal Steps: Moderate Impairment Total Score: 22       Pertinent Vitals/Pain Pain Assessment: No/denies pain    Home Living Family/patient expects to be discharged to:: Private residence Living Arrangements: Alone   Type of Home: Other(Comment) (condo) Home Access: Stairs to enter Entrance Stairs-Rails: Right Entrance Stairs-Number of Steps: 15 Home Layout: One level Home Equipment: Cane - single point      Prior Function Level of Independence: Independent         Comments: Very independent; does lots of community work with church     Journalist, newspaper        Extremity/Trunk Assessment   Upper Extremity Assessment Upper Extremity Assessment: Overall WFL for tasks assessed    Lower Extremity Assessment Lower Extremity Assessment: Overall WFL for tasks  assessed    Cervical / Trunk Assessment Cervical / Trunk Assessment: Normal  Communication      Cognition Arousal/Alertness: Awake/alert Behavior During Therapy: WFL for tasks assessed/performed Overall Cognitive Status: Within Functional Limits for tasks assessed                                        General Comments       Exercises     Assessment/Plan    PT Assessment Patent does not need any further PT services  PT Problem List         PT Treatment Interventions      PT Goals (Current goals can be found in the Care Plan section)  Acute Rehab PT Goals Patient Stated Goal: return home PT Goal Formulation: All assessment and education complete, DC therapy    Frequency     Barriers to discharge        Co-evaluation               AM-PAC PT "6 Clicks" Mobility  Outcome Measure Help needed turning from your back to your side while in a flat bed without using bedrails?: None Help needed moving from lying on your back to sitting on the side of a flat bed without using bedrails?: None Help needed moving to and from a bed to a chair (including a wheelchair)?: None Help needed standing up from a chair using your arms (e.g., wheelchair or bedside chair)?: None Help needed to walk in hospital room?: None Help needed climbing 3-5 steps with a railing? : None 6 Click Score: 24    End of Session Equipment Utilized During Treatment: Gait belt Activity Tolerance: Patient tolerated treatment well Patient left: in chair;with call bell/phone within reach Nurse Communication: Mobility status PT Visit Diagnosis: Unsteadiness on feet (R26.81)    Time: 1250-1303 PT Time Calculation (min) (ACUTE ONLY): 13 min   Charges:   PT Evaluation $PT Eval Low Complexity: 1 Low          Conswella Bruney, PT Acute Rehab Services Pager [phone] [facility] [phone]   Karlton Lemon 06/10/2021, 2:07 PM

## 2021-06-10 NOTE — Consult Note (Signed)
Cardiology Consultation:   Patient ID: Teresa Poole MRN: [id number]; DOB: Mar 20, 1951  Admit date: 06/09/2021 Date of Consult: 06/10/2021  PCP:  [name], MD   [facility] Providers Cardiologist:  Irish Lack   Patient Profile:   Teresa Poole is a 70 y.o. female with a hx of NICM who is being seen 06/10/2021 for the evaluation of elevated troponin at the request of Dr. [name].  History of Present Illness:   Teresa Poole has a history of nonischemic cardiomyopathy dating back to 2015.  She had a cardiac cath at that time showing nonobstructive, minimal CAD.  She has been managed medically for some time.  I last saw her in April 2020.  Of late, she reports some dyspnea on exertion.  She has had a pressure in her chest which is essentially been constant over the last 4 days.  It is worse when she lies down.  Troponins were minimally elevated.  She also was found to have hyponatremia and renal insufficiency which has gradually worsened over the years.  She denies any chest pain.  She just has had a constant pressure as noted above.  Walking has been limited by dyspnea on exertion.  She does have a family history of heart disease.   Past Medical History:  Diagnosis Date   Allergy    Anemia    CAD (coronary artery disease)    non-obstructive by LHC (09/25/2013): Proximal and mid LAD serial 20%, proximal circumflex 30%, mid AV groove circumflex 30%, mid RCA mild plaque.   Cardiomyopathy, hypertensive ([place]) 09/18/2013   Carotid stenosis    a. Carotid US (09/26/2011): Bilateral 1-39% ICA.   Chronic combined systolic and diastolic CHF (congestive heart failure) (HCC)    CKD (chronic kidney disease), stage III ([place]) 09/24/2013   Pt at [place], Dr. Joelyn Oms   Diabetes mellitus without complication [facility])    Edema 09/14/2013   GERD (gastroesophageal reflux disease)    History of ovarian cancer    Hyperlipidemia    Hypertension    Obesity (BMI 30-39.9)    Other  primary cardiomyopathies 10/26/2013   Renal artery stenosis ([place]) 10/11/2018   Thyroid disease    Seen by specialist    Past Surgical History:  Procedure Laterality Date   LEFT HEART CATHETERIZATION WITH CORONARY ANGIOGRAM N/A 09/25/2013   Procedure: LEFT HEART CATHETERIZATION WITH CORONARY ANGIOGRAM;  Surgeon: Burnell Blanks, MD;  Location: [facility] CATH LAB;  Service: Cardiovascular;  Laterality: N/A;   RENAL ANGIOGRAPHY N/A 10/06/2018   Procedure: RENAL ANGIOGRAPHY;  Surgeon: Marty Heck, MD;  Location: [place] CV LAB;  Service: Cardiovascular;  Laterality: N/A;       Inpatient Medications: Scheduled Meds:  carvedilol  25 mg Oral BID   cycloSPORINE  1 drop Both Eyes BID   enoxaparin (LOVENOX) injection  30 mg Subcutaneous Q24H   NIFEdipine  60 mg Oral BID   pantoprazole  40 mg Oral Daily   rosuvastatin  10 mg Oral QHS   Continuous Infusions:  PRN Meds: acetaminophen, ondansetron (ZOFRAN) IV  Allergies:    Allergies  Allergen Reactions   Hydralazine Hcl Itching and Other (See Comments)    ENTIRE BODY = burning sensation, also in the eyes (they have become very sensitive to light)   Atorvastatin Other (See Comments)    Per MD    Social History:   Social History   Socioeconomic History   Marital status: Single    Spouse name: Not on file  Number of children: 0   Years of education: Not on file   Highest education level: Not on file  Occupational History    Employer: [employer]  Tobacco Use   Smoking status: Never   Smokeless tobacco: Never  Substance and Sexual Activity   Alcohol use: No   Drug use: No   Sexual activity: Not on file  Other Topics Concern   Not on file  Social History Narrative   Works at [employer]   Patient lives at home alone.    Patient has no children.    Patient patient is right handed.    Patient is single.    Social Determinants of Health   Financial Resource Strain: Not on file  Food Insecurity: Not on file   Transportation Needs: Not on file  Physical Activity: Not on file  Stress: Not on file  Social Connections: Not on file  Intimate Partner Violence: Not on file    Family History:    Family History  Problem Relation Age of Onset   Diabetes Father    Hypertension Father    Hypertension Maternal Grandfather      ROS:  Please see the history of present illness.  Dyspnea on exertion All other ROS reviewed and negative.     Physical Exam/Data:   Vitals:   06/10/21 0700 06/10/21 0949 06/10/21 1103 06/10/21 1606  BP: (!) 143/68 (!) 166/76 (!) 154/78 (!) 174/85  Pulse: 74 77 89 81  Resp: 17 17 18 12   Temp:  98.1 F (36.7 C) 98.1 F (36.7 C) 98.3 F (36.8 C)  TempSrc:  Oral  Oral  SpO2: 98% 100% 98% 100%  Weight:  75.8 kg    Height:  5\' 6"  (1.676 m)      Intake/Output Summary (Last 24 hours) at 06/10/2021 1612 Last data filed at 06/10/2021 1548 Gross per 24 hour  Intake 120 ml  Output --  Net 120 ml   Last 3 Weights 06/10/2021 06/09/2021 03/11/2020  Weight (lbs) 167 lb 1.7 oz 170 lb 211 lb 12.8 oz  Weight (kg) 75.8 kg 77.111 kg 96.072 kg     Body mass index is 26.97 kg/m.  General:  Well nourished, well developed, in no acute distress HEENT: normal Neck: no JVD Vascular: No carotid bruits; Distal pulses 2+ bilaterally Cardiac:  normal S1, S2; RRR; no murmur  Lungs:  clear to auscultation bilaterally, no wheezing, rhonchi or rales  Abd: soft, nontender, no hepatomegaly  Ext: no edema Musculoskeletal:  No deformities, BUE and BLE strength normal and equal Skin: warm and dry  Neuro:  CNs 2-12 intact, no focal abnormalities noted Psych:  Normal affect   EKG:  The EKG was personally reviewed and demonstrates:  normal sinus rhythm, prolonged PR interval, right bundle branch block Telemetry:  Telemetry was personally reviewed and demonstrates: Normal sinus rhythm  Relevant CV Studies: 2015 echo showed EF 35%  Laboratory Data:  High Sensitivity Troponin:    Recent Labs  Lab 06/09/21 1922 06/09/21 2320 06/10/21 0328 06/10/21 0526  TROPONINIHS 17 23* 35* 31*     Chemistry Recent Labs  Lab 06/09/21 1922 06/10/21 1013  NA 127* 132*  K 3.8 4.3  CL 94* 101  CO2 21* 20*  GLUCOSE 146* 151*  BUN 46* 38*  CREATININE 2.63* 2.48*  CALCIUM 9.0 9.2  GFRNONAA 19* 21*  ANIONGAP 12 11    No results for input(s): PROT, ALBUMIN, AST, ALT, ALKPHOS, BILITOT in the last 168 hours.  Lipids No results for input(s): CHOL, TRIG, HDL, LABVLDL, LDLCALC, CHOLHDL in the last 168 hours.  Hematology Recent Labs  Lab 06/09/21 1922  WBC 8.5  RBC 3.65*  HGB 11.0*  HCT 31.1*  MCV 85.2  MCH 30.1  MCHC 35.4  RDW 13.3  PLT 239   Thyroid No results for input(s): TSH, FREET4 in the last 168 hours.  BNPNo results for input(s): BNP, PROBNP in the last 168 hours.  DDimer No results for input(s): DDIMER in the last 168 hours.   Radiology/Studies:  DG Chest 2 View  Result Date: 06/09/2021 CLINICAL DATA:  Chest pain EXAM: CHEST - 2 VIEW COMPARISON:  11/27/2018 FINDINGS: Heart and mediastinal contours are within normal limits. No focal opacities or effusions. No acute bony abnormality. Aortic atherosclerosis. IMPRESSION: No active cardiopulmonary disease. Electronically Signed   By: Rolm Baptise M.D.   On: 06/09/2021 19:17   CT Head Wo Contrast  Result Date: 06/09/2021 CLINICAL DATA:  Headache. EXAM: CT HEAD WITHOUT CONTRAST TECHNIQUE: Contiguous axial images were obtained from the base of the skull through the vertex without intravenous contrast. COMPARISON:  CT head 10/04/2018. FINDINGS: Brain: No evidence of acute infarction, hemorrhage, hydrocephalus, extra-axial collection or mass lesion/mass effect. Vascular: No hyperdense vessel or unexpected calcification. Skull: Normal. Negative for fracture or focal lesion. Sinuses/Orbits: No acute finding. Other: None. IMPRESSION: No acute intracranial abnormality. Electronically Signed   By: Ronney Asters M.D.   On:  06/09/2021 22:49     Assessment and Plan:   Elevated troponin: Flat trend.  Minimally elevated readings in the setting of significant renal dysfunction.  I do not think her elevated troponins are from ACS.  She may have some chronic systolic heart failure.  Would check echocardiogram to reassess ejection fraction.  Would also check BNP. Chronic systolic heart failure: Continue carvedilol.  I would avoid HCTZ given her hyponatremia.  If diuretic was needed, would use furosemide. Chronic renal insufficiency: We will have to be followed if diuresis is needed. Hyponatremia: She does try to drink a lot of water.  She has lost significant weight by changing her diet habits.  I did explain to her that potentially fluid restriction may be needed depending on how her sodium does.  It seems to have improved of late. TXM:[id number] would also be an option.    Risk Assessment/Risk Scores:       New York Heart Association (NYHA) Functional Class NYHA Class III        For questions or updates, please contact [facility] Please consult [email or web address] for contact info under    Signed, Larae Grooms, MD  06/10/2021 4:12 PM

## 2021-06-10 NOTE — ED Notes (Signed)
Pt yelling out loudly, c/o pain "whizzing" "sharp" pain in the left chest that goes down the left arm and cramping in her left leg. 6/10. Ambulated to restroom without difficulty. EDP made aware, new orders obtained. EKG given to rancour. Pt now resting.

## 2021-06-10 NOTE — Progress Notes (Signed)
Patient admitted to [facility] from [facility], A&O, tele place, vitals WNL, Patient fall risk assessment is moderate, but she refused bed alarm. Prefer to use the call bell if she needs help. Patient educated on bed alarm and using call bell.  [facility] admitting paged to make aware of patient arrival.

## 2021-06-10 NOTE — ED Notes (Signed)
Called to give report, was asked to call back in 10 minutes.

## 2021-06-10 NOTE — Plan of Care (Signed)
  Problem: Education: Goal: Knowledge of General Education information will improve Description: Including pain rating scale, medication(s)/side effects and non-pharmacologic comfort measures Outcome: Progressing   Problem: Clinical Measurements: Goal: Will remain free from infection Outcome: Progressing   

## 2021-06-10 NOTE — H&P (Addendum)
History and Physical    Teresa Poole DJS:[id number] DOB: 1951-05-10 DOA: 06/09/2021  PCP: [name], MD Consultants:  Joelyn Oms - nephrology; Dwyane Dee - endocrinology; Carlis Abbott - vascular Patient coming from:  Home - lives alone; NOK: Teresa Poole, or sister, Hassell Done, [phone], [phone]; [phone]  Chief Complaint: Burning all over  HPI: Teresa Poole is a 70 y.o. female with medical history significant of CAD; chronic combined CHF;  stage 3 CKD; DM; ovarian cancer; HTN; HLD; hypothyroidism; and RAS presenting with burning all over.  She often doesn't feel well, sleeps more.  She noticed over the last day or two that she was unsteady on her feet.  It was worse in hte morning, she would have to watch her feet.  She doesn't drink alcohol.  She especially has to be careful on her steps.  She doesn't tend to have panic attacks but she has had these lately  She gets easily exhausted.  She thinks she has some of "biochemical, it's not an itching, it's like oh I ran 150 miles" and she sleeps maybe 1-2 hours even in her car.  It feels like her muscles are tired.  She gets a periodic heaviness in her chest and neck with radiation down her arm.  She noticed this yesterday.  She had that same pressure all night long and she was breathing normally.  The pressure is "moderate" now, last in 2015.      ED Course: [facility] to [facility] transfer, per Dr. Marlowe Sax:  70 year old with history of hypertension, CAD, diabetes, CKD stage III.  Poor historian, complaining of burning sensation all over body/burning chest pain.  Also reported diarrhea to PCP and currently undergoing outpatient work-up but denied diarrhea or vomiting when ED physician asked.  Also complaining of headaches.  Labs showing mild elevation of troponin 17 >23, EKG negative.  Sodium low at 127, was 137 a week ago.  CT head negative.  Chest x-ray negative.  Patient was given a 500 cc fluid bolus.   Review of Systems: As per  HPI; otherwise review of systems reviewed and negative.   Ambulatory Status:  Ambulates without assistance  COVID Vaccine Status: Complete  Past Medical History:  Diagnosis Date   Allergy    Anemia    CAD (coronary artery disease)    non-obstructive by LHC (09/25/2013): Proximal and mid LAD serial 20%, proximal circumflex 30%, mid AV groove circumflex 30%, mid RCA mild plaque.   Cardiomyopathy, hypertensive ([place]) 09/18/2013   Carotid stenosis    a. Carotid US (09/26/2011): Bilateral 1-39% ICA.   Chronic combined systolic and diastolic CHF (congestive heart failure) (HCC)    CKD (chronic kidney disease), stage III ([place]) 09/24/2013   Pt at [place], Dr. Joelyn Oms   Diabetes mellitus without complication [facility])    Edema 09/14/2013   GERD (gastroesophageal reflux disease)    History of ovarian cancer    Hyperlipidemia    Hypertension    Obesity (BMI 30-39.9)    Other primary cardiomyopathies 10/26/2013   Renal artery stenosis ([place]) 10/11/2018   Thyroid disease    Seen by specialist    Past Surgical History:  Procedure Laterality Date   LEFT HEART CATHETERIZATION WITH CORONARY ANGIOGRAM N/A 09/25/2013   Procedure: LEFT HEART CATHETERIZATION WITH CORONARY ANGIOGRAM;  Surgeon: Burnell Blanks, MD;  Location: [facility] CATH LAB;  Service: Cardiovascular;  Laterality: N/A;   RENAL ANGIOGRAPHY N/A 10/06/2018   Procedure: RENAL ANGIOGRAPHY;  Surgeon: Marty Heck, MD;  Location: [facility]  CV LAB;  Service: Cardiovascular;  Laterality: N/A;    Social History   Socioeconomic History   Marital status: Single    Spouse name: Not on file   Number of children: 0   Years of education: Not on file   Highest education level: Not on file  Occupational History    Employer: [employer]  Tobacco Use   Smoking status: Never   Smokeless tobacco: Never  Substance and Sexual Activity   Alcohol use: No   Drug use: No   Sexual activity: Not on file  Other Topics Concern   Not  on file  Social History Narrative   Works at [employer]   Patient lives at home alone.    Patient has no children.    Patient patient is right handed.    Patient is single.    Social Determinants of Health   Financial Resource Strain: Not on file  Food Insecurity: Not on file  Transportation Needs: Not on file  Physical Activity: Not on file  Stress: Not on file  Social Connections: Not on file  Intimate Partner Violence: Not on file    Allergies  Allergen Reactions   Hydralazine Hcl Itching and Other (See Comments)    ENTIRE BODY = burning sensation, also in the eyes (they have become very sensitive to light)   Atorvastatin Other (See Comments)    Per MD    Family History  Problem Relation Age of Onset   Diabetes Father    Hypertension Father    Hypertension Maternal Grandfather     Prior to Admission medications   Medication Sig Start Date End Date Taking? Authorizing Provider  amLODipine (NORVASC) 10 MG tablet Take 1 tablet (10 mg total) by mouth daily. 10/10/18   Copland, Gay Filler, MD  ASPIRIN 81 PO Take by mouth daily.    [provider]  carvedilol (COREG) 25 MG tablet Take 25 mg by mouth 2 (two) times daily with a meal.    [provider]  Cholecalciferol (VITAMIN D3) 25 MCG (1000 UT) CAPS Take 2,000 Units by mouth daily with breakfast. Patient not taking: Reported on 03/11/2020    [provider]  CINNAMON PO Take 1 capsule by mouth daily with breakfast.    [provider]  COVID-19 mRNA vaccine, Pfizer, 30 MCG/0.3ML injection INJECT AS DIRECTED 07/23/20 07/23/21  Carlyle Basques, MD  glucose blood (ONE TOUCH ULTRA TEST) test strip Use to check blood sugar 3 times daily Dx code E11.65 11/20/14   Elayne Snare, MD  indapamide (LOZOL) 2.5 MG tablet Take 2.5 mg by mouth daily. Patient not taking: Reported on 03/11/2020    [provider]  lisinopril (PRINIVIL,ZESTRIL) 40 MG tablet Take 1 tablet (40 mg total) by mouth daily. 10/10/18    Copland, Gay Filler, MD  Multiple Vitamins-Minerals (MULTIVITAMIN GUMMIES ADULT) CHEW Chew 1 tablet by mouth daily with breakfast.    [provider]  [facility] VERIO test strip CHECK BLOOD SUGAR THREE TIMES DAILY 03/30/18   Elayne Snare, MD  rosuvastatin (CRESTOR) 10 MG tablet TAKE 1 TABLET(10 MG) BY MOUTH DAILY 03/11/20   Elayne Snare, MD  TRULICITY 1.61 WR/6.0AV SOPN ADMINISTER 0.75 MG UNDER THE SKIN EVERY SUNDAY 04/14/20   Elayne Snare, MD  TURMERIC PO Take 1 capsule by mouth daily with breakfast.    [provider]  VITAMIN E PO Take 1 capsule by mouth daily with breakfast.    [provider]    Physical Exam:  Vitals:   06/10/21 0700 06/10/21 0949 06/10/21 1103 06/10/21 1606  BP: (!) 143/68 (!) 166/76 (!) 154/78 (!) 174/85  Pulse: 74 77 89 81  Resp: 17 17 18 12   Temp:  98.1 F (36.7 C) 98.1 F (36.7 C) 98.3 F (36.8 C)  TempSrc:  Oral  Oral  SpO2: 98% 100% 98% 100%  Weight:  75.8 kg    Height:  5\' 6"  (1.676 m)       General:  Appears calm and comfortable and is in NAD Eyes:   EOMI, normal lids, iris ENT:  grossly normal hearing, lips & tongue, mmm; appropriate dentition Neck:  no LAD, masses or thyromegaly Cardiovascular:  RRR, no m/r/g. No LE edema.  Respiratory:   CTA bilaterally with no wheezes/rales/rhonchi.  Normal respiratory effort. Abdomen:  soft, NT, ND Skin:  no rash or induration seen on limited exam Musculoskeletal:  grossly normal tone BUE/BLE, good ROM, no bony abnormality Psychiatric:  anxious mood and affect, speech fluent and appropriate with some repetition of speech, tangentiality, delusions? Neurologic:  CN 2-12 grossly intact, moves all extremities in coordinated fashion    Radiological Exams on Admission: Independently reviewed - see discussion in A/P where applicable  DG Chest 2 View  Result Date: 06/09/2021 CLINICAL DATA:  Chest pain EXAM: CHEST - 2 VIEW COMPARISON:  11/27/2018 FINDINGS: Heart and mediastinal contours are  within normal limits. No focal opacities or effusions. No acute bony abnormality. Aortic atherosclerosis. IMPRESSION: No active cardiopulmonary disease. Electronically Signed   By: Rolm Baptise M.D.   On: 06/09/2021 19:17   CT Head Wo Contrast  Result Date: 06/09/2021 CLINICAL DATA:  Headache. EXAM: CT HEAD WITHOUT CONTRAST TECHNIQUE: Contiguous axial images were obtained from the base of the skull through the vertex without intravenous contrast. COMPARISON:  CT head 10/04/2018. FINDINGS: Brain: No evidence of acute infarction, hemorrhage, hydrocephalus, extra-axial collection or mass lesion/mass effect. Vascular: No hyperdense vessel or unexpected calcification. Skull: Normal. Negative for fracture or focal lesion. Sinuses/Orbits: No acute finding. Other: None. IMPRESSION: No acute intracranial abnormality. Electronically Signed   By: Ronney Asters M.D.   On: 06/09/2021 22:49    EKG: Independently reviewed.  NSR with rate 89; RBBB with NSCSLT   Labs on Admission: I have personally reviewed the available labs and imaging studies at the time of the admission.  Pertinent labs:   Na++ 127; 137 on 10/10 Glucose 146 BUN 46/Creatinine 2.63/GFR 19 - stable HS troponin 17, 23, 35, 31 WBC 8.5 Hgb 11.0 COVID/flu negative   Assessment/Plan Principal Problem:   Chest pain of uncertain etiology Active Problems:   Uncontrolled hypertension   Stage 4 chronic kidney disease (HCC)   Chronic combined systolic and diastolic heart failure (HCC)   Mixed hyperlipidemia   Hyponatremia   Adjustment disorder with anxiety   Chest pain -Patient with lots of vague complaints that appear to vary reporting L chest pressure and L posterior neck pain and radiation down her left arm -CXR unremarkable.   -Initial cardiac HS troponin with mild elevation but negative delta -EKG not indicative of acute ischemia, has RBBB.   -Will plan to place in observation status on telemetry to rule out ACS by overnight  observation.  -Cardiology consultation requested  Hyponatremia -Initial story was vague but there was concern for symptomatic hyponatremia based on drop from 137 -> 127 in a week -Now up to 133 -Currently appears asymptomatic, no further evaluation appears to be indicated -Hold Indapamide  Panic attacks/anxiety -Very atypical affect and  repetitive speech with some apparent delusions -She reports no prior h/o anxiety/panic but recent issues with this -Will obtain psych consult -While there may be an organic cause, evaluation thus far is not overly remarkable -Will add TSH and B12  Stage IV CKD -Has h/o RAS -Appears to be stable at this time -Will follow -Per last nephrology note, she will likely be referred for transplant evaluation soon -Patient was "adamant... that she would rather die than start dialysis" -She was due to f/u in 10/2020 and does not appear to have followed up -Hold Lisinopril - maybe permanently  HTN -Continue Coreg, Nifedipine  HLD -Continue Crestor -Hold Zetia due to limited inpatient utility  Hypothyroidism -Glucose 152 -Diet controlled -Last A1c was 5.2 -There is no indication to start medication at this time -Will cover with moderate-scale SSI for now  Chronic combined CHF -ER 35% in 2015 -Appears to be compensated at this time -Cardiology consult pending  H/o ovarian CA -Recent GI note reports that this was cervical cancer 32 years ago     Note: This patient has been tested and is negative for the novel coronavirus COVID-19. She has been fully vaccinated against COVID-19.    DVT prophylaxis: Lovenox  Code Status:  DNR - confirmed with patient Family Communication: None present Disposition Plan:  The patient is from: home  Anticipated d/c is to: home without [facility] services   Anticipated d/c date will depend on clinical response to treatment, but possibly as early as tomorrow if she has excellent response to treatment  Patient is  currently: acutely ill Consults called: Cardiology; Psychiatry  Admission status: It is my clinical opinion that referral for OBSERVATION is reasonable and necessary in this patient based on the above information provided. The aforementioned taken together are felt to place the patient at high risk for further clinical deterioration. However it is anticipated that the patient may be medically stable for discharge from the hospital within 24 to 48 hours.      [name] MD [facility]   How to contact the [facility] Attending or Consulting provider [place] or covering provider during after hours [place], for this patient?  Check the care team in [facility] and look for a) attending/consulting [facility] provider listed and b) the [facility] team listed Log into [email or web address] and use [place]'s universal password to access. If you do not have the password, please contact the hospital operator. Locate the [facility] provider you are looking for under [facility] and page to a number that you can be directly reached. If you still have difficulty reaching the provider, please page the [facility] (Director on Call) for the Hospitalists listed on amion for assistance.   06/10/2021, 7:03 PM

## 2021-06-10 NOTE — ED Notes (Signed)
Pt asleep. New order rec'd from provider for additional 2 hour troponin level

## 2021-06-10 NOTE — Plan of Care (Signed)
[facility] will assume care on arrival to accepting facility. Until arrival, care as per EDP. However, [facility] available 24/7 for questions and assistance.  Nursing staff please page [facility] Admits and Consults ([phone]) as soon as the patient arrives the hospital.   

## 2021-06-11 ENCOUNTER — Observation Stay (HOSPITAL_BASED_OUTPATIENT_CLINIC_OR_DEPARTMENT_OTHER): Payer: Medicare PPO

## 2021-06-11 DIAGNOSIS — I429 Cardiomyopathy, unspecified: Secondary | ICD-10-CM

## 2021-06-11 DIAGNOSIS — I5042 Chronic combined systolic (congestive) and diastolic (congestive) heart failure: Secondary | ICD-10-CM

## 2021-06-11 DIAGNOSIS — I428 Other cardiomyopathies: Secondary | ICD-10-CM

## 2021-06-11 DIAGNOSIS — R52 Pain, unspecified: Secondary | ICD-10-CM

## 2021-06-11 DIAGNOSIS — R079 Chest pain, unspecified: Secondary | ICD-10-CM | POA: Diagnosis not present

## 2021-06-11 DIAGNOSIS — F4322 Adjustment disorder with anxiety: Secondary | ICD-10-CM | POA: Diagnosis not present

## 2021-06-11 LAB — BRAIN NATRIURETIC PEPTIDE: B Natriuretic Peptide: 23.1 pg/mL (ref 0.0–100.0)

## 2021-06-11 LAB — ECHOCARDIOGRAM COMPLETE
Area-P 1/2: 3.72 cm2
Height: 66 in
S' Lateral: 2.8 cm
Weight: 2648 oz

## 2021-06-11 MED ORDER — SALINE SPRAY 0.65 % NA SOLN
1.0000 | NASAL | Status: DC | PRN
  Filled 2021-06-11: qty 44

## 2021-06-11 NOTE — Plan of Care (Signed)

## 2021-06-11 NOTE — Care Management Obs Status (Signed)
[place] NOTIFICATION   Patient Details  Name: Teresa Poole MRN: [id number] Date of Birth: March 06, 1951   Medicare Observation Status Notification Given:  Yes    Zenon Mayo, RN 06/11/2021, 3:19 PM

## 2021-06-11 NOTE — Plan of Care (Signed)
  Problem: Education: Goal: Knowledge of General Education information will improve Description: Including pain rating scale, medication(s)/side effects and non-pharmacologic comfort measures 06/11/2021 1531 by Augusto Garbe, RN Outcome: Adequate for Discharge 06/11/2021 1425 by Augusto Garbe, RN Outcome: Progressing   Problem: Health Behavior/Discharge Planning: Goal: Ability to manage health-related needs will improve 06/11/2021 1531 by Augusto Garbe, RN Outcome: Adequate for Discharge 06/11/2021 1425 by Augusto Garbe, RN Outcome: Progressing   Problem: Clinical Measurements: Goal: Ability to maintain clinical measurements within normal limits will improve 06/11/2021 1531 by Augusto Garbe, RN Outcome: Adequate for Discharge 06/11/2021 1425 by Augusto Garbe, RN Outcome: Progressing Goal: Will remain free from infection 06/11/2021 1531 by Augusto Garbe, RN Outcome: Adequate for Discharge 06/11/2021 1425 by Augusto Garbe, RN Outcome: Progressing Goal: Cardiovascular complication will be avoided 06/11/2021 1531 by Augusto Garbe, RN Outcome: Adequate for Discharge 06/11/2021 1425 by Augusto Garbe, RN Outcome: Progressing

## 2021-06-11 NOTE — Discharge Summary (Signed)
Physician Discharge Summary   Patient ID: Teresa Poole MRN: [id number] DOB/AGE: 1951-05-02 70 y.o.  Admit date: 06/09/2021 Discharge date: 06/11/2021  Primary Care Physician:  [name], MD   Recommendations for Outpatient Follow-up:  Follow up with PCP in 1-2 weeks Please obtain BMP at follow up appointment  Indapamide discontinued  Home Health: None  Equipment/Devices:   Discharge Condition: stable CODE STATUS: FULL Diet recommendation: heart healthy    Discharge Diagnoses:      Hyponatremia  Chest pain of uncertain etiology  Uncontrolled hypertension  Stage 4 chronic kidney disease ([place])  Mixed hyperlipidemia  Chronic combined systolic and diastolic heart failure (HCC) Anxiety  Diabetes mellitus type 2, NIDDM, with CKD  Consults:  cardiology psychiatry    Allergies:   Allergies  Allergen Reactions   Hydralazine Hcl Itching and Other (See Comments)    ENTIRE BODY = burning sensation, also in the eyes (they have become very sensitive to light)   Atorvastatin Other (See Comments)    Per MD     DISCHARGE MEDICATIONS: Allergies as of 06/11/2021       Reactions   Hydralazine Hcl Itching, Other (See Comments)   ENTIRE BODY = burning sensation, also in the eyes (they have become very sensitive to light)   Atorvastatin Other (See Comments)   Per MD        Medication List     STOP taking these medications    indapamide 2.5 MG tablet Commonly known as: LOZOL       TAKE these medications    carvedilol 25 MG tablet Commonly known as: COREG Take 25 mg by mouth 2 (two) times daily with a meal.   Cequa 0.09 % Soln Generic drug: cycloSPORINE (PF) Place 1 drop into both eyes in the morning and at bedtime.   ezetimibe 10 MG tablet Commonly known as: ZETIA Take 10 mg by mouth daily.   glucose blood test strip Commonly known as: ONE TOUCH ULTRA TEST Use to check blood sugar 3 times daily Dx code E11.65   OneTouch Verio test  strip Generic drug: glucose blood CHECK BLOOD SUGAR THREE TIMES DAILY   lisinopril 40 MG tablet Commonly known as: ZESTRIL Take 1 tablet (40 mg total) by mouth daily.   NIFEdipine 60 MG 24 hr tablet Commonly known as: ADALAT CC Take 60 mg by mouth in the morning and at bedtime.   omeprazole 40 MG capsule Commonly known as: PRILOSEC Take 40 mg by mouth daily.   Ozempic (0.25 or 0.5 MG/DOSE) 2 MG/1.5ML Sopn Generic drug: Semaglutide(0.25 or 0.5MG /DOS) Inject 0.25 mg into the skin once a week. Tuesday's   rosuvastatin 20 MG tablet Commonly known as: CRESTOR Take 20 mg by mouth at bedtime.         Brief H and P: For complete details please refer to admission H and P, but in brief patient is a 70 year old female with history of CAD, chronic combined CHF, CKD stage III, hypertension, hyperlipidemia, hypothyroidism presented with burning all over.  Patient felt that over the last day or 2 she was unsteady, worse in the morning.  Does not drink alcohol.  She also felt anxious and easily exhausted.  Felt periodic heaviness in her chest and neck with radiation down her arm.  No shortness of breath  Hospital Course:  Atypical chest pain -Patient had other weak symptoms however given her cardiac history cardiology was consulted.  EKG did not show any acute ischemia. -Initial cardiac troponin with mild elevation.  Patient was ruled out for acute ischemia.  2D echo was obtained which showed improved EF 55 to 60%, grade 1 DD. Cardiology was consulted, felt initial elevated troponin likely due to her renal dysfunction.  No further cardiac work-up planned at this time and cleared to be discharged home.  Mild hyponatremia -Likely due to poor oral intake and indapamide, discontinued   Anxiety -Currently stable, patient has family stressors going on with her father in hospice and terminal. -Patient recommended to follow-up with her primary PCP and possibly outpatient psychiatry  Diabetes  mellitus type 2, with complications, CKD stage IV -CBGs currently stable, continue outpatient meds  CKD stage IV -Baseline creatinine around 2.6 -Creatinine currently 2.4, around baseline,  Day of Discharge S: No acute complaints, hoping to go home today to see her father.  BP 115/64 (BP Location: Left Arm)   Pulse 81   Temp 98.1 F (36.7 C) (Oral)   Resp 20   Ht 5\' 6"  (1.676 m)   Wt 75.1 kg   SpO2 100%   BMI 26.71 kg/m   Physical Exam: General: Alert and awake oriented x3 not in any acute distress. CVS: S1-S2 clear no murmur rubs or gallops Chest: clear to auscultation bilaterally, no wheezing rales or rhonchi Abdomen: soft nontender, nondistended, normal bowel sounds Extremities: no cyanosis, clubbing or edema noted bilaterally Neuro: no new deficits.    Get Medicines reviewed and adjusted: Please take all your medications with you for your next visit with your Primary MD  Please request your Primary MD to go over all hospital tests and procedure/radiological results at the follow up. Please ask your Primary MD to get all Hospital records sent to his/her office.  If you experience worsening of your admission symptoms, develop shortness of breath, life threatening emergency, suicidal or homicidal thoughts you must seek medical attention immediately by calling 911 or calling your MD immediately  if symptoms less severe.  You must read complete instructions/literature along with all the possible adverse reactions/side effects for all the Medicines you take and that have been prescribed to you. Take any new Medicines after you have completely understood and accept all the possible adverse reactions/side effects.   Do not drive when taking pain medications.   Do not take more than prescribed Pain, Sleep and Anxiety Medications  Special Instructions: If you have smoked or chewed Tobacco  in the last 2 yrs please stop smoking, stop any regular Alcohol  and or any Recreational  drug use.  Wear Seat belts while driving.  Please note  You were cared for by a hospitalist during your hospital stay. Once you are discharged, your primary care physician will handle any further medical issues. Please note that NO REFILLS for any discharge medications will be authorized once you are discharged, as it is imperative that you return to your primary care physician (or establish a relationship with a primary care physician if you do not have one) for your aftercare needs so that they can reassess your need for medications and monitor your lab values.   The results of significant diagnostics from this hospitalization (including imaging, microbiology, ancillary and laboratory) are listed below for reference.      Procedures/Studies:  DG Chest 2 View  Result Date: 06/09/2021 CLINICAL DATA:  Chest pain EXAM: CHEST - 2 VIEW COMPARISON:  11/27/2018 FINDINGS: Heart and mediastinal contours are within normal limits. No focal opacities or effusions. No acute bony abnormality. Aortic atherosclerosis. IMPRESSION: No active cardiopulmonary disease. Electronically Signed  By: Rolm Baptise M.D.   On: 06/09/2021 19:17   CT Head Wo Contrast  Result Date: 06/09/2021 CLINICAL DATA:  Headache. EXAM: CT HEAD WITHOUT CONTRAST TECHNIQUE: Contiguous axial images were obtained from the base of the skull through the vertex without intravenous contrast. COMPARISON:  CT head 10/04/2018. FINDINGS: Brain: No evidence of acute infarction, hemorrhage, hydrocephalus, extra-axial collection or mass lesion/mass effect. Vascular: No hyperdense vessel or unexpected calcification. Skull: Normal. Negative for fracture or focal lesion. Sinuses/Orbits: No acute finding. Other: None. IMPRESSION: No acute intracranial abnormality. Electronically Signed   By: Ronney Asters M.D.   On: 06/09/2021 22:49   ECHOCARDIOGRAM COMPLETE  Result Date: 06/11/2021    ECHOCARDIOGRAM REPORT   Patient Name:   Teresa Poole Date  of Exam: 06/11/2021 Medical Rec #:  [id number]          Height:       66.0 in Accession #:    [id number]         Weight:       165.5 lb Date of Birth:  1950-09-21          BSA:          1.845 m Patient Age:    21 years           BP:           103/64 mmHg Patient Gender: F                  HR:           73 bpm. 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 Cardiomyopathy and CHF, CAD; Risk                 Factors:Hypertension, Dyslipidemia and Diabetes.  Sonographer:    Bernadene Person RDCS Referring Phys: [place]  1. Left ventricular ejection fraction, by estimation, is 55 to 60%. The left ventricle has normal function. The left ventricle demonstrates mild hypokinesis in the basal inferior wall with normal wall motion in the remaining myocardial wall segments. There is mild concentric left ventricular hypertrophy. Left ventricular diastolic parameters are consistent with Grade I diastolic dysfunction (impaired relaxation).  2. Right ventricular systolic function is normal. The right ventricular size is normal.  3. The mitral valve is normal in structure. No evidence of mitral valve regurgitation. No evidence of mitral stenosis.  4. The aortic valve is normal in structure. Aortic valve regurgitation is trivial. Mild aortic valve sclerosis is present, with no evidence of aortic valve stenosis.  5. The inferior vena cava is normal in size with greater than 50% respiratory variability, suggesting right atrial pressure of 3 mmHg. FINDINGS  Left Ventricle: Left ventricular ejection fraction, by estimation, is 55 to 60%. The left ventricle has normal function. The left ventricle demonstrates regional wall motion abnormalities. The left ventricular internal cavity size was normal in size. There is mild  concentric left ventricular hypertrophy. Left ventricular diastolic parameters are consistent with Grade I diastolic dysfunction (impaired relaxation).  LV Wall Scoring: The basal inferior segment is hypokinetic. The entire anterior wall, antero-lateral wall, mid and distal anterior septum, inferior septum, entire apex, mid and distal inferior wall, and basal inferolateral segment are  normal. Right Ventricle: The right ventricular size is normal. No increase in right ventricular wall thickness. Right ventricular systolic function is normal. Left Atrium: Left atrial size was normal in size. Right Atrium: Right atrial size was normal in size. Pericardium: There is no evidence of pericardial effusion. Presence of pericardial fat pad. Mitral Valve: The mitral valve is normal in structure. No evidence of mitral valve regurgitation. No evidence of mitral valve stenosis. Tricuspid Valve: The tricuspid valve is normal in structure. Tricuspid valve regurgitation is not demonstrated. No evidence of tricuspid stenosis. Aortic Valve: The aortic valve is normal in structure. Aortic valve regurgitation is trivial. Mild aortic valve sclerosis is present, with no evidence of aortic valve stenosis. Pulmonic Valve: The pulmonic valve was normal in structure. Pulmonic valve regurgitation is not visualized. No evidence of pulmonic stenosis. Aorta: The aortic root is normal in size and structure. Venous: The inferior vena cava is normal in size with greater than 50% respiratory variability, suggesting right atrial pressure of 3 mmHg. IAS/Shunts: No atrial level shunt detected by color flow Doppler.  LEFT VENTRICLE PLAX 2D LVIDd:         4.40 cm   Diastology LVIDs:         2.80 cm   LV e' medial:    6.08 cm/s LV PW:         1.10 cm   LV E/e' medial:  15.1 LV IVS:        0.80 cm   LV e' lateral:   6.81 cm/s LVOT diam:     1.90 cm   LV E/e' lateral: 13.5 LV SV:         71 LV SV Index:   38 LVOT Area:     2.84 cm  RIGHT VENTRICLE RV S  prime:     13.30 cm/s TAPSE (M-mode): 1.9 cm LEFT ATRIUM             Index        RIGHT ATRIUM          Index LA diam:        3.00 cm 1.63 cm/m   RA Area:     9.27 cm LA Vol (A2C):   43.7 ml 23.68 ml/m  RA Volume:   17.50 ml 9.48 ml/m LA Vol (A4C):   41.0 ml 22.22 ml/m LA Biplane Vol: 44.7 ml 24.23 ml/m  AORTIC VALVE LVOT Vmax:   131.00 cm/s LVOT Vmean:  89.200 cm/s LVOT VTI:    0.250 m  AORTA Ao Root diam: 3.10 cm Ao Asc diam:  3.20 cm MITRAL VALVE MV Area (PHT): 3.72 cm     SHUNTS MV Decel Time: 204 msec     Systemic VTI:  0.25 m MV E velocity: 91.90 cm/s   Systemic Diam: 1.90 cm MV A velocity: 124.00 cm/s MV E/A ratio:  0.74 Kardie Tobb DO Electronically signed by Berniece Salines DO Signature Date/Time: 06/11/2021/2:44:51 PM    Final       LAB RESULTS: Basic Metabolic Panel: Recent Labs  Lab 06/09/21 1922 06/10/21 1013  NA 127* 132*  K 3.8 4.3  CL 94* 101  CO2 21* 20*  GLUCOSE 146* 151*  BUN 46* 38*  CREATININE 2.63* 2.48*  CALCIUM 9.0 9.2   Liver Function Tests: No results for input(s): AST, ALT, ALKPHOS, BILITOT, PROT, ALBUMIN in the last 168 hours. No results for input(s): LIPASE, AMYLASE in the last 168 hours. No results for input(s): AMMONIA in the last 168 hours. CBC:  Recent Labs  Lab 06/09/21 1922  WBC 8.5  HGB 11.0*  HCT 31.1*  MCV 85.2  PLT 239   Cardiac Enzymes: No results for input(s): CKTOTAL, CKMB, CKMBINDEX, TROPONINI in the last 168 hours. BNP: Invalid input(s): POCBNP CBG: Recent Labs  Lab 06/10/21 1104 06/10/21 1605  GLUCAP 144* 126*       Disposition and Follow-up: Discharge Instructions     Diet - low sodium heart healthy   Complete by: As directed    Increase activity slowly   Complete by: As directed         DISPOSITION: Home   DISCHARGE FOLLOW-UP  Follow-up Information     [name], MD. Schedule an appointment as soon as possible for a visit in 2 week(s).   Specialty: Internal Medicine Why: for hospital  follow-up Contact information: [address] [place] [phone]                  Time coordinating discharge:  35 minutes  Signed:   Estill Cotta M.D. [facility] 06/11/2021, 3:26 PM

## 2021-06-11 NOTE — Progress Notes (Signed)
Progress Note  Patient Name: TAMRAH VICTORINO Date of Encounter: 06/11/2021  [facility] Cardiologist: None [place] well.  Hoping to go home.  Inpatient Medications    Scheduled Meds:  carvedilol  25 mg Oral BID   cycloSPORINE  1 drop Both Eyes BID   enoxaparin (LOVENOX) injection  30 mg Subcutaneous Q24H   NIFEdipine  60 mg Oral BID   pantoprazole  40 mg Oral Daily   rosuvastatin  10 mg Oral QHS   Continuous Infusions:  PRN Meds: acetaminophen, ondansetron (ZOFRAN) IV, sodium chloride   Vital Signs    Vitals:   06/11/21 0041 06/11/21 0423 06/11/21 0721 06/11/21 1110  BP: (!) 96/45 (!) 115/48 103/64 115/64  Pulse: 71 70 77 81  Resp: 18 17 17 20   Temp: 98.6 F (37 C) 98 F (36.7 C) 98.1 F (36.7 C) 98.1 F (36.7 C)  TempSrc: Oral Oral Oral Oral  SpO2: 91% 99% 98% 100%  Weight: 75.1 kg     Height:        Intake/Output Summary (Last 24 hours) at 06/11/2021 1447 Last data filed at 06/11/2021 1236 Gross per 24 hour  Intake 560 ml  Output 950 ml  Net -390 ml   Last 3 Weights 06/11/2021 06/10/2021 06/09/2021  Weight (lbs) 165 lb 8 oz 167 lb 1.7 oz 170 lb  Weight (kg) 75.07 kg 75.8 kg 77.111 kg      Telemetry    Normal sinus rhythm- Personally Reviewed  ECG    Normal sinus rhythm right bundle branch block- Personally Reviewed  Physical Exam   GEN: No acute distress.   Neck: No JVD Cardiac: RRR, no murmurs, rubs, or gallops.  Respiratory: Clear to auscultation bilaterally. GI: Soft, nontender, non-distended  MS: No edema; No deformity. Neuro:  Nonfocal  Psych: Normal affect   Labs    High Sensitivity Troponin:   Recent Labs  Lab 06/09/21 1922 06/09/21 2320 06/10/21 0328 06/10/21 0526  TROPONINIHS 17 23* 35* 31*     Chemistry Recent Labs  Lab 06/09/21 1922 06/10/21 1013  NA 127* 132*  K 3.8 4.3  CL 94* 101  CO2 21* 20*  GLUCOSE 146* 151*  BUN 46* 38*  CREATININE 2.63* 2.48*  CALCIUM 9.0 9.2   GFRNONAA 19* 21*  ANIONGAP 12 11    Lipids No results for input(s): CHOL, TRIG, HDL, LABVLDL, LDLCALC, CHOLHDL in the last 168 hours.  Hematology Recent Labs  Lab 06/09/21 1922  WBC 8.5  RBC 3.65*  HGB 11.0*  HCT 31.1*  MCV 85.2  MCH 30.1  MCHC 35.4  RDW 13.3  PLT 239   Thyroid  Recent Labs  Lab 06/10/21 1906  TSH 1.332    BNP Recent Labs  Lab 06/11/21 0342  BNP 23.1    DDimer No results for input(s): DDIMER in the last 168 hours.   Radiology    DG Chest 2 View  Result Date: 06/09/2021 CLINICAL DATA:  Chest pain EXAM: CHEST - 2 VIEW COMPARISON:  11/27/2018 FINDINGS: Heart and mediastinal contours are within normal limits. No focal opacities or effusions. No acute bony abnormality. Aortic atherosclerosis. IMPRESSION: No active cardiopulmonary disease. Electronically Signed   By: Rolm Baptise M.D.   On: 06/09/2021 19:17   CT Head Wo Contrast  Result Date: 06/09/2021 CLINICAL DATA:  Headache. EXAM: CT HEAD WITHOUT CONTRAST TECHNIQUE: Contiguous axial images were obtained from the base of the skull through the vertex without intravenous contrast. COMPARISON:  CT  head 10/04/2018. FINDINGS: Brain: No evidence of acute infarction, hemorrhage, hydrocephalus, extra-axial collection or mass lesion/mass effect. Vascular: No hyperdense vessel or unexpected calcification. Skull: Normal. Negative for fracture or focal lesion. Sinuses/Orbits: No acute finding. Other: None. IMPRESSION: No acute intracranial abnormality. Electronically Signed   By: Ronney Asters M.D.   On: 06/09/2021 22:49   ECHOCARDIOGRAM COMPLETE  Result Date: 06/11/2021    ECHOCARDIOGRAM REPORT   Patient Name:   PENNELOPE BASQUE Date of Exam: 06/11/2021 Medical Rec #:  [id number]          Height:       66.0 in Accession #:    [id number]         Weight:       165.5 lb Date of Birth:  [birth year]          BSA:          1.845 m Patient Age:    70 years           BP:           103/64 mmHg Patient Gender: F                   HR:           73 bpm. 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 Cardiomyopathy and CHF, CAD; Risk                 Factors:Hypertension, Dyslipidemia and Diabetes.  Sonographer:    Bernadene Person RDCS Referring Phys: [place]  1. Left ventricular ejection fraction, by estimation, is 55 to 60%. The left ventricle has normal function. The left ventricle demonstrates mild hypokinesis in the basal inferior wall with normal wall motion in the remaining myocardial wall segments. There is mild concentric left ventricular hypertrophy. Left ventricular diastolic parameters are consistent with Grade I diastolic dysfunction (impaired relaxation).  2. Right ventricular systolic function is normal. The right ventricular size is normal.  3. The mitral valve is normal in structure. No evidence of mitral valve regurgitation. No evidence of mitral stenosis.  4. The aortic valve is normal in structure. Aortic valve regurgitation is trivial. Mild aortic valve sclerosis is present, with no evidence of aortic valve stenosis.  5. The inferior vena cava is normal in size with greater than 50% respiratory variability, suggesting right atrial pressure of 3 mmHg. FINDINGS  Left Ventricle: Left ventricular ejection fraction, by estimation, is 55 to 60%. The left ventricle has normal function. The left ventricle demonstrates regional wall motion abnormalities. The left ventricular internal cavity size was normal in size. There is mild concentric left ventricular hypertrophy. Left ventricular diastolic parameters are consistent with Grade I diastolic dysfunction (impaired relaxation).  LV Wall Scoring: The basal inferior segment is hypokinetic. The entire anterior wall, antero-lateral wall, mid and distal anterior  septum, inferior septum, entire apex, mid and distal inferior wall, and basal inferolateral segment are normal. Right Ventricle: The right ventricular size is normal. No increase in right ventricular wall thickness. Right ventricular systolic function is normal. Left Atrium: Left atrial size was normal in size. Right Atrium: Right atrial size was normal in size. Pericardium: There is no evidence of pericardial effusion. Presence of  pericardial fat pad. Mitral Valve: The mitral valve is normal in structure. No evidence of mitral valve regurgitation. No evidence of mitral valve stenosis. Tricuspid Valve: The tricuspid valve is normal in structure. Tricuspid valve regurgitation is not demonstrated. No evidence of tricuspid stenosis. Aortic Valve: The aortic valve is normal in structure. Aortic valve regurgitation is trivial. Mild aortic valve sclerosis is present, with no evidence of aortic valve stenosis. Pulmonic Valve: The pulmonic valve was normal in structure. Pulmonic valve regurgitation is not visualized. No evidence of pulmonic stenosis. Aorta: The aortic root is normal in size and structure. Venous: The inferior vena cava is normal in size with greater than 50% respiratory variability, suggesting right atrial pressure of 3 mmHg. IAS/Shunts: No atrial level shunt detected by color flow Doppler.  LEFT VENTRICLE PLAX 2D LVIDd:         4.40 cm   Diastology LVIDs:         2.80 cm   LV e' medial:    6.08 cm/s LV PW:         1.10 cm   LV E/e' medial:  15.1 LV IVS:        0.80 cm   LV e' lateral:   6.81 cm/s LVOT diam:     1.90 cm   LV E/e' lateral: 13.5 LV SV:         71 LV SV Index:   38 LVOT Area:     2.84 cm  RIGHT VENTRICLE RV S prime:     13.30 cm/s TAPSE (M-mode): 1.9 cm LEFT ATRIUM             Index        RIGHT ATRIUM          Index LA diam:        3.00 cm 1.63 cm/m   RA Area:     9.27 cm LA Vol (A2C):   43.7 ml 23.68 ml/m  RA Volume:   17.50 ml 9.48 ml/m LA Vol (A4C):   41.0 ml 22.22 ml/m LA Biplane  Vol: 44.7 ml 24.23 ml/m  AORTIC VALVE LVOT Vmax:   131.00 cm/s LVOT Vmean:  89.200 cm/s LVOT VTI:    0.250 m  AORTA Ao Root diam: 3.10 cm Ao Asc diam:  3.20 cm MITRAL VALVE MV Area (PHT): 3.72 cm     SHUNTS MV Decel Time: 204 msec     Systemic VTI:  0.25 m MV E velocity: 91.90 cm/s   Systemic Diam: 1.90 cm MV A velocity: 124.00 cm/s MV E/A ratio:  0.74 Kardie Tobb DO Electronically signed by Berniece Salines DO Signature Date/Time: 06/11/2021/2:44:51 PM    Final     Cardiac Studies   Echo images personally reviewed- normal LVEF.  Improved from 2015 echo  Patient Profile     70 y.o. female with elevated troponin, CRI  Assessment & Plan    NICM: echo showed that the EF has improved compared to echo from 2015.  No angina with walking in the hospital.  Would not pursue any further cardiac work-up at this time.  Continue blood pressure control and risk factor modifications.  Continue healthy diet.  She will try to increase her activity as well.  Elevated troponin was likely due to renal dysfunction.  No further cardiac work-up planned.  Continue with efforts at blood pressure control.  Would avoid HCTZ given her hyponatremia.  Currently well controlled.  Please call with questions.     For questions or updates, please  contact [place] Please consult [email or web address] for contact info under        Signed, Larae Grooms, MD  06/11/2021, 2:47 PM

## 2021-06-11 NOTE — Progress Notes (Signed)
  Echocardiogram 2D Echocardiogram has been performed.  Teresa Poole 06/11/2021, 1:15 PM

## 2021-07-09 ENCOUNTER — Emergency Department (HOSPITAL_COMMUNITY)
Admission: EM | Admit: 2021-07-09 | Discharge: 2021-07-09 | Disposition: A | Discharge status: Home / Self Care | Payer: Medicare PPO | Attending: Emergency Medicine | Admitting: Emergency Medicine

## 2021-07-09 ENCOUNTER — Encounter (HOSPITAL_COMMUNITY): Payer: Self-pay

## 2021-07-09 DIAGNOSIS — Z8543 Personal history of malignant neoplasm of ovary: Secondary | ICD-10-CM | POA: Diagnosis not present

## 2021-07-09 DIAGNOSIS — N184 Chronic kidney disease, stage 4 (severe): Secondary | ICD-10-CM | POA: Insufficient documentation

## 2021-07-09 DIAGNOSIS — I130 Hypertensive heart and chronic kidney disease with heart failure and stage 1 through stage 4 chronic kidney disease, or unspecified chronic kidney disease: Secondary | ICD-10-CM | POA: Diagnosis not present

## 2021-07-09 DIAGNOSIS — I5041 Acute combined systolic (congestive) and diastolic (congestive) heart failure: Secondary | ICD-10-CM | POA: Insufficient documentation

## 2021-07-09 DIAGNOSIS — Z955 Presence of coronary angioplasty implant and graft: Secondary | ICD-10-CM | POA: Insufficient documentation

## 2021-07-09 DIAGNOSIS — I10 Essential (primary) hypertension: Secondary | ICD-10-CM

## 2021-07-09 DIAGNOSIS — R55 Syncope and collapse: Secondary | ICD-10-CM | POA: Diagnosis present

## 2021-07-09 DIAGNOSIS — I2510 Atherosclerotic heart disease of native coronary artery without angina pectoris: Secondary | ICD-10-CM | POA: Insufficient documentation

## 2021-07-09 DIAGNOSIS — E119 Type 2 diabetes mellitus without complications: Secondary | ICD-10-CM | POA: Insufficient documentation

## 2021-07-09 DIAGNOSIS — Z79899 Other long term (current) drug therapy: Secondary | ICD-10-CM | POA: Diagnosis not present

## 2021-07-09 MED ORDER — CARVEDILOL 12.5 MG PO TABS
25.0000 mg | ORAL_TABLET | Freq: Two times a day (BID) | ORAL | Status: DC
  Administered 2021-07-09: 25 mg via ORAL
  Filled 2021-07-09: qty 2

## 2021-07-09 MED ORDER — NIFEDIPINE ER 60 MG PO TB24: 60.0000 mg | ORAL_TABLET | Freq: Every day | ORAL | 0 refills | Status: DC

## 2021-07-09 NOTE — ED Provider Notes (Signed)
[facility] DEPT Provider Note   CSN: [id number] Arrival date & time: 07/09/21  8099     History Chief Complaint  Patient presents with   Hypotension    Teresa Poole is a 70 y.o. female.  HPI She presents for a sensation of near syncope and "disorientation," which occurred when she got up to go drink water this morning.  She sat down and improved then stood up again and the problem recurred.  She called EMS who found her with a blood pressure of 76/44 and transported her here.  They did not otherwise intervene.  Patient saw her PCP 4 days ago to follow-up on ED visit from 07/01/2021.  At that time she was worked up with CT head and EKG.  She was discharged and instructed to follow-up with both neurology and cardiology, which she has not yet done.  She had a 2-day hospitalization, about 4 weeks ago at which time she was treated for hyponatremia, evaluation for chest pain, and found to have stable chronic congestive heart failure.  Her diuretic indapamide was stopped during the hospitalization.  Cardiology consultation was done during the hospitalization.  She has chronic elevated creatinine.  Cardiac echo done showed EF of 55 to 60%.  Patient has been otherwise well.  She has been taking her blood pressure, numerous times during the night, for unclear reasons.  She is found, "slightly high and slightly low."  Patient is on multiple antihypertensives; carvedilol, lisinopril, and nifedipine which she takes twice a day.  There are no other known active modifying factors.    Past Medical History:  Diagnosis Date   Allergy    Anemia    CAD (coronary artery disease)    non-obstructive by LHC (09/25/2013): Proximal and mid LAD serial 20%, proximal circumflex 30%, mid AV groove circumflex 30%, mid RCA mild plaque.   Cardiomyopathy, hypertensive ([place]) 09/18/2013   Carotid stenosis    a. Carotid US (09/26/2011): Bilateral 1-39% ICA.   Chronic combined systolic and  diastolic CHF (congestive heart failure) (HCC)    CKD (chronic kidney disease), stage III ([place]) 09/24/2013   Pt at [place], Dr. Joelyn Oms   Diabetes mellitus without complication [facility])    Edema 09/14/2013   GERD (gastroesophageal reflux disease)    History of ovarian cancer    Hyperlipidemia    Hypertension    Obesity (BMI 30-39.9)    Other primary cardiomyopathies 10/26/2013   Renal artery stenosis ([place]) 10/11/2018   Thyroid disease    Seen by specialist    Patient Active Problem List   Diagnosis Date Noted   Hyponatremia 06/10/2021   Adjustment disorder with anxiety 06/10/2021   Chest pain of uncertain etiology 83/38/2505   Renal artery stenosis ([place]) 10/11/2018   Mixed hyperlipidemia 10/26/2013   Chronic combined systolic and diastolic heart failure ([place]) 09/28/2013   Stage 4 chronic kidney disease ([place]) 09/24/2013   Hypertensive urgency 09/18/2013   Cardiomyopathy, hypertensive ([place]) 09/18/2013   Edema 09/14/2013   Acute combined systolic and diastolic congestive heart failure ([place]) 09/09/2013   Hypertensive heart disease with CHF (congestive heart failure) ([place]) 09/09/2013   Uncontrolled hypertension 09/07/2013   Diabetes mellitus type II, uncontrolled 09/07/2013   Obesity (BMI 30-39.9)    History of ovarian cancer     Past Surgical History:  Procedure Laterality Date   LEFT HEART CATHETERIZATION WITH CORONARY ANGIOGRAM N/A 09/25/2013   Procedure: LEFT HEART CATHETERIZATION WITH CORONARY ANGIOGRAM;  Surgeon: Burnell Blanks, MD;  Location[facility]  CATH LAB;  Service: Cardiovascular;  Laterality: N/A;   RENAL ANGIOGRAPHY N/A 10/06/2018   Procedure: RENAL ANGIOGRAPHY;  Surgeon: Marty Heck, MD;  Location: [place] CV LAB;  Service: Cardiovascular;  Laterality: N/A;     OB History   No obstetric history on file.     Family History  Problem Relation Age of Onset   Diabetes Father    Hypertension Father    Hypertension Maternal Grandfather      Social History   Tobacco Use   Smoking status: Never   Smokeless tobacco: Never  Substance Use Topics   Alcohol use: No   Drug use: No    Home Medications Prior to Admission medications   Medication Sig Start Date End Date Taking? Authorizing Provider  cycloSPORINE, PF, (CEQUA) 0.09 % SOLN Place 1 drop into both eyes in the morning and at bedtime. 12/18/20  Yes [provider]  ezetimibe (ZETIA) 10 MG tablet Take 10 mg by mouth daily. 06/09/21  Yes [provider]  lisinopril (PRINIVIL,ZESTRIL) 40 MG tablet Take 1 tablet (40 mg total) by mouth daily. 10/10/18  Yes Copland, Gay Filler, MD  omeprazole (PRILOSEC) 40 MG capsule Take 40 mg by mouth 2 (two) times daily. 06/08/21  Yes [provider]  OZEMPIC, 0.25 OR 0.5 MG/DOSE, 2 MG/1.5ML SOPN Inject 0.25 mg into the skin once a week. Sundays 05/12/21  Yes [provider]  glucose blood (ONE TOUCH ULTRA TEST) test strip Use to check blood sugar 3 times daily Dx code E11.65 11/20/14   Elayne Snare, MD  NIFEdipine (ADALAT CC) 60 MG 24 hr tablet Take 1 tablet (60 mg total) by mouth daily. 07/09/21   Daleen Bo, MD  ONETOUCH VERIO test strip CHECK BLOOD SUGAR THREE TIMES DAILY 03/30/18   Elayne Snare, MD    Allergies    Hydralazine hcl and Atorvastatin  Review of Systems   Review of Systems  All other systems reviewed and are negative.  Physical Exam Updated Vital Signs BP (!) 116/54   Pulse 72   Temp 97.8 F (36.6 C)   Resp 17   Ht 5\' 7"  (1.702 m)   Wt 69.9 kg   SpO2 99%   BMI 24.12 kg/m   Physical Exam Vitals and nursing note reviewed.  Constitutional:      General: She is not in acute distress.    Appearance: She is well-developed. She is not toxic-appearing or diaphoretic.  HENT:     Head: Normocephalic and atraumatic.     Right Ear: External ear normal.     Left Ear: External ear normal.  Eyes:     Conjunctiva/sclera: Conjunctivae normal.     Pupils: Pupils are equal, round, and  reactive to light.  Neck:     Trachea: Phonation normal.  Cardiovascular:     Rate and Rhythm: Normal rate and regular rhythm.     Heart sounds: Normal heart sounds.  Pulmonary:     Effort: Pulmonary effort is normal.     Breath sounds: Normal breath sounds.  Abdominal:     General: There is no distension.     Palpations: Abdomen is soft.  Musculoskeletal:        General: Normal range of motion.     Cervical back: Normal range of motion and neck supple.  Skin:    General: Skin is warm and dry.  Neurological:     Mental Status: She is alert and oriented to person, place, and time.  Cranial Nerves: No cranial nerve deficit.     Sensory: No sensory deficit.     Motor: No abnormal muscle tone.     Coordination: Coordination normal.  Psychiatric:        Mood and Affect: Mood normal.        Behavior: Behavior normal.        Thought Content: Thought content normal.        Judgment: Judgment normal.    ED Results / Procedures / Treatments   Labs (all labs ordered are listed, but only abnormal results are displayed) Labs Reviewed - No data to display  EKG EKG Interpretation  Date/Time:  Wednesday July 09 2021 07:28:44 EST Ventricular Rate:  61 PR Interval:  203 QRS Duration: 167 QT Interval:  449 QTC Calculation: 453 R Axis:   24 Text Interpretation: Sinus rhythm Right bundle branch block since last tracing no significant change Confirmed by Daleen Bo [phone]) on 07/09/2021 7:36:11 AM  Radiology No results found.  Procedures Procedures   Medications Ordered in ED Medications  carvedilol (COREG) tablet 25 mg (25 mg Oral Given 07/09/21 0749)    ED Course  I have reviewed the triage vital signs and the nursing notes.  Pertinent labs & imaging results that were available during my care of the patient were reviewed by me and considered in my medical decision making (see chart for details).  Clinical Course as of 07/09/21 0924  Wed Jul 09, 2021  0902 The  pharmacy technician was able to clarify her antihypertensive treatment.  Patient is not currently taking carvedilol, only taking lisinopril and nifedipine.  The latter is twice daily. [EW]    Clinical Course User Index [EW] Daleen Bo, MD   MDM Rules/Calculators/A&P                            Patient Vitals for the past 24 hrs:  BP Temp Pulse Resp SpO2 Height Weight  07/09/21 0915 (!) 116/54 -- 72 17 99 % -- --  07/09/21 0900 (!) 104/57 -- 69 (!) 24 99 % -- --  07/09/21 0712 111/63 97.8 F (36.6 C) 67 18 100 % 5\' 7"  (1.702 m) 69.9 kg  07/09/21 0708 -- -- -- -- 100 % -- --    9:20 AM Reevaluation with update and discussion. After initial assessment and treatment, an updated evaluation reveals she continues to be comfortable.  Repeat blood pressure is slightly low.   Findings discussed and questions answered. Daleen Bo   Medical Decision Making:  This patient is presenting for evaluation of lightheadedness, which does require a range of treatment options, and is a complaint that involves a moderate risk of morbidity and mortality. The differential diagnoses include ROM depletion, medication complications, progression of cardiac disease, malaise, anxiety. I decided to review old records, and in summary elderly female, living alone presenting with low blood pressure and a sensation of near syncope, early this morning.  Blood pressure spontaneously improved.  She is on multiple antihypertensives and takes a 24 Biphetamine preparation, twice a day.  She has had multiple recent evaluations including hospitalizations, PCP and ER visits within the last month.  She plans on seeing neurology and cardiology as outpatient.  I did not require additional historical information from anyone.   Cardiac Monitor Tracing which shows normal sinus rhythm     Critical Interventions-clinical evaluation, cardiac monitoring, EKG, blood pressure monitoring, observation Assessment  After These  Interventions, the Patient was  reevaluated and was found stable for discharge.  Patient with.  Hypertension, improved spontaneously.  I suspect that the twice daily calcium channel blocker is contributing to a.m. hypotension.  Patient is stable for discharge.  Clarification of medical record indicates that she is not actually taking carvedilol as previously suspected.  Also indapamide was still on her medicine list and this was removed.  No indication for hospitalization at this time.  CRITICAL CARE-no Performed by: Daleen Bo  Nursing Notes Reviewed/ Care Coordinated Applicable Imaging Reviewed Interpretation of Laboratory Data incorporated into ED treatment  The patient appears reasonably screened and/or stabilized for discharge and I doubt any other medical condition or other [facility] requiring further screening, evaluation, or treatment in the ED at this time prior to discharge.  Plan: Home Medications-stop evening nifedipine, continue other currently prescribed medication; Home Treatments-rest, fluids; return here if the recommended treatment, does not improve the symptoms; Recommended follow up-PCP blood pressure check 1 week.     Final Clinical Impression(s) / ED Diagnoses Final diagnoses:  Hypertension, unspecified type    Rx / DC Orders ED Discharge Orders          Ordered    NIFEdipine (ADALAT CC) 60 MG 24 hr tablet  Daily        07/09/21 0917             Daleen Bo, MD 07/09/21 [phone]

## 2021-07-09 NOTE — ED Triage Notes (Addendum)
Pt presents via EMS from home with c/o hypotension. Pt reports that she started feeling some disorientation yesterday around 7 pm, went to bed, and was still disoriented when she woke up and felt like she was going to pass out. Initial BP for EMS was 76/44. Pt does have hypertension and does take several medications for this. Unsure as to how many pt took.

## 2021-07-09 NOTE — Discharge Instructions (Signed)
Stop taking the Nifedipine (Adalat) in the evening. Resume all of your usual medication, otherwise tomorrow morning.

## 2021-08-21 ENCOUNTER — Emergency Department (HOSPITAL_BASED_OUTPATIENT_CLINIC_OR_DEPARTMENT_OTHER): Payer: Medicare PPO

## 2021-08-21 ENCOUNTER — Encounter (HOSPITAL_BASED_OUTPATIENT_CLINIC_OR_DEPARTMENT_OTHER): Payer: Self-pay | Admitting: *Deleted

## 2021-08-21 ENCOUNTER — Emergency Department (HOSPITAL_BASED_OUTPATIENT_CLINIC_OR_DEPARTMENT_OTHER)
Admission: EM | Admit: 2021-08-21 | Discharge: 2021-08-22 | Disposition: A | Discharge status: Home / Self Care | Payer: Medicare PPO | Attending: Emergency Medicine | Admitting: Emergency Medicine

## 2021-08-21 ENCOUNTER — Other Ambulatory Visit: Payer: Self-pay

## 2021-08-21 DIAGNOSIS — R42 Dizziness and giddiness: Secondary | ICD-10-CM | POA: Diagnosis not present

## 2021-08-21 DIAGNOSIS — H5789 Other specified disorders of eye and adnexa: Secondary | ICD-10-CM | POA: Insufficient documentation

## 2021-08-21 DIAGNOSIS — L989 Disorder of the skin and subcutaneous tissue, unspecified: Secondary | ICD-10-CM | POA: Insufficient documentation

## 2021-08-21 DIAGNOSIS — I2510 Atherosclerotic heart disease of native coronary artery without angina pectoris: Secondary | ICD-10-CM | POA: Diagnosis not present

## 2021-08-21 DIAGNOSIS — N3000 Acute cystitis without hematuria: Secondary | ICD-10-CM | POA: Insufficient documentation

## 2021-08-21 DIAGNOSIS — N183 Chronic kidney disease, stage 3 unspecified: Secondary | ICD-10-CM | POA: Insufficient documentation

## 2021-08-21 DIAGNOSIS — I5042 Chronic combined systolic (congestive) and diastolic (congestive) heart failure: Secondary | ICD-10-CM | POA: Insufficient documentation

## 2021-08-21 DIAGNOSIS — E1122 Type 2 diabetes mellitus with diabetic chronic kidney disease: Secondary | ICD-10-CM | POA: Diagnosis not present

## 2021-08-21 DIAGNOSIS — Z794 Long term (current) use of insulin: Secondary | ICD-10-CM | POA: Diagnosis not present

## 2021-08-21 DIAGNOSIS — I130 Hypertensive heart and chronic kidney disease with heart failure and stage 1 through stage 4 chronic kidney disease, or unspecified chronic kidney disease: Secondary | ICD-10-CM | POA: Diagnosis not present

## 2021-08-21 DIAGNOSIS — R198 Other specified symptoms and signs involving the digestive system and abdomen: Secondary | ICD-10-CM | POA: Diagnosis present

## 2021-08-21 DIAGNOSIS — Z79899 Other long term (current) drug therapy: Secondary | ICD-10-CM | POA: Insufficient documentation

## 2021-08-21 DIAGNOSIS — R208 Other disturbances of skin sensation: Secondary | ICD-10-CM

## 2021-08-21 LAB — URINALYSIS, ROUTINE W REFLEX MICROSCOPIC
Bilirubin Urine: NEGATIVE
Glucose, UA: NEGATIVE mg/dL
Hgb urine dipstick: NEGATIVE
Ketones, ur: NEGATIVE mg/dL
Nitrite: NEGATIVE
Protein, ur: 30 mg/dL — AB
Specific Gravity, Urine: 1.015 (ref 1.005–1.030)
pH: 6 (ref 5.0–8.0)

## 2021-08-21 LAB — URINALYSIS, MICROSCOPIC (REFLEX)

## 2021-08-21 LAB — CBC WITH DIFFERENTIAL/PLATELET
Abs Immature Granulocytes: 0.01 10*3/uL (ref 0.00–0.07)
Basophils Absolute: 0 10*3/uL (ref 0.0–0.1)
Basophils Relative: 1 %
Eosinophils Absolute: 0.1 10*3/uL (ref 0.0–0.5)
Eosinophils Relative: 1 %
HCT: 30.1 % — ABNORMAL LOW (ref 36.0–46.0)
Hemoglobin: 10.2 g/dL — ABNORMAL LOW (ref 12.0–15.0)
Immature Granulocytes: 0 %
Lymphocytes Relative: 39 %
Lymphs Abs: 2.1 10*3/uL (ref 0.7–4.0)
MCH: 30.2 pg (ref 26.0–34.0)
MCHC: 33.9 g/dL (ref 30.0–36.0)
MCV: 89.1 fL (ref 80.0–100.0)
Monocytes Absolute: 0.5 10*3/uL (ref 0.1–1.0)
Monocytes Relative: 9 %
Neutro Abs: 2.7 10*3/uL (ref 1.7–7.7)
Neutrophils Relative %: 50 %
Platelets: 240 10*3/uL (ref 150–400)
RBC: 3.38 MIL/uL — ABNORMAL LOW (ref 3.87–5.11)
RDW: 13.3 % (ref 11.5–15.5)
WBC: 5.3 10*3/uL (ref 4.0–10.5)
nRBC: 0 % (ref 0.0–0.2)

## 2021-08-21 NOTE — ED Provider Notes (Signed)
[place] DEPT [facility] Provider Note: Georgena Spurling, MD, FACEP  CSN: [id number] MRN: [id number] ARRIVAL: 08/21/21 at [place]: [place]  Pain   HISTORY OF PRESENT ILLNESS  08/21/21 10:56 PM Teresa Poole is a 70 y.o. female with 5 days of what she describes as "my entire GI tract is burning like sulfuric acid".  She also has some sensation of this burning in her eyes and to a lesser extent her skin.  Nothing makes it better or worse.  The symptoms are not severe but concerning.  She has noticed some lesions on her skin through the same period of time.  These are hyperpigmented macules that progressed to papular lesions that then crust over.  She is also having some mild dizziness which she describes as a sense of being off balance and she is having to walk with care.  She denies nausea, vomiting or diarrhea but feels like a gallon of water is sitting in her abdomen.  She was recently placed on an antibiotic but she does not know what this was for.    Past Medical History:  Diagnosis Date   Allergy    Anemia    CAD (coronary artery disease)    non-obstructive by LHC (09/25/2013): Proximal and mid LAD serial 20%, proximal circumflex 30%, mid AV groove circumflex 30%, mid RCA mild plaque.   Cardiomyopathy, hypertensive ([place]) 09/18/2013   Carotid stenosis    a. Carotid US (09/26/2011): Bilateral 1-39% ICA.   Chronic combined systolic and diastolic CHF (congestive heart failure) (HCC)    CKD (chronic kidney disease), stage III ([place]) 09/24/2013   Pt at [place], Dr. Joelyn Oms   Diabetes mellitus without complication [facility])    Edema 09/14/2013   GERD (gastroesophageal reflux disease)    History of ovarian cancer    Hyperlipidemia    Hypertension    Obesity (BMI 30-39.9)    Other primary cardiomyopathies 10/26/2013   Renal artery stenosis ([place]) 10/11/2018   Thyroid disease    Seen by specialist    Past Surgical History:  Procedure Laterality Date    LEFT HEART CATHETERIZATION WITH CORONARY ANGIOGRAM N/A 09/25/2013   Procedure: LEFT HEART CATHETERIZATION WITH CORONARY ANGIOGRAM;  Surgeon: Burnell Blanks, MD;  Location: [facility] CATH LAB;  Service: Cardiovascular;  Laterality: N/A;   RENAL ANGIOGRAPHY N/A 10/06/2018   Procedure: RENAL ANGIOGRAPHY;  Surgeon: Marty Heck, MD;  Location: [place] CV LAB;  Service: Cardiovascular;  Laterality: N/A;    Family History  Problem Relation Age of Onset   Diabetes Father    Hypertension Father    Hypertension Maternal Grandfather     Social History   Tobacco Use   Smoking status: Never   Smokeless tobacco: Never  Substance Use Topics   Alcohol use: No   Drug use: No    Prior to Admission medications   Medication Sig Start Date End Date Taking? Authorizing Provider  cycloSPORINE, PF, (CEQUA) 0.09 % SOLN Place 1 drop into both eyes in the morning and at bedtime. 12/18/20   [provider]  ezetimibe (ZETIA) 10 MG tablet Take 10 mg by mouth daily. 06/09/21   [provider]  glucose blood (ONE TOUCH ULTRA TEST) test strip Use to check blood sugar 3 times daily Dx code E11.65 11/20/14   Elayne Snare, MD  lisinopril (PRINIVIL,ZESTRIL) 40 MG tablet Take 1 tablet (40 mg total) by mouth daily. 10/10/18   Copland, Gay Filler, MD  NIFEdipine (ADALAT CC) 60  MG 24 hr tablet Take 1 tablet (60 mg total) by mouth daily. 07/09/21   Daleen Bo, MD  omeprazole (PRILOSEC) 40 MG capsule Take 40 mg by mouth 2 (two) times daily. 06/08/21   [provider]  ONETOUCH VERIO test strip [place] DAILY 03/30/18   Elayne Snare, MD  OZEMPIC, 0.25 OR 0.5 MG/DOSE, 2 MG/1.5ML SOPN Inject 0.25 mg into the skin once a week. Sundays 05/12/21   [provider]    Allergies Hydralazine hcl and Atorvastatin   REVIEW OF SYSTEMS  Negative except as noted here or in the History of Present Illness.   PHYSICAL EXAMINATION  Initial Vital Signs Blood pressure  118/78, pulse 87, temperature 98.2 F (36.8 C), temperature source Oral, resp. rate 16, height 5\' 6"  (1.676 m), weight 70.3 kg, SpO2 100 %.  Examination General: Well-developed, well-nourished female in no acute distress; appearance consistent with age of record HENT: normocephalic; atraumatic; no mucosal inflammation Eyes: pupils equal, round and reactive to light; extraocular muscles intact; no conjunctival inflammation Neck: supple Heart: regular rate and rhythm Lungs: clear to auscultation bilaterally Abdomen: soft; nondistended; nontender; bowel sounds present Extremities: No deformity; full range of motion; pulses normal Neurologic: Awake, alert and oriented; motor function intact in all extremities and symmetric; no facial droop Skin: Warm and dry; a few scattered hyperpigmented papular and maculopapular lesions:    Psychiatric: Pressured speech   RESULTS  Summary of this visit's results, reviewed and interpreted by myself:   EKG Interpretation  Date/Time:    Ventricular Rate:    PR Interval:    QRS Duration:   QT Interval:    QTC Calculation:   R Axis:     Text Interpretation:         Laboratory Studies: Results for orders placed or performed during the hospital encounter of 08/21/21 (from the past 24 hour(s))  CBC with Differential/Platelet     Status: Abnormal   Collection Time: 08/21/21 11:23 PM  Result Value Ref Range   WBC 5.3 4.0 - 10.5 K/uL   RBC 3.38 (L) 3.87 - 5.11 MIL/uL   Hemoglobin 10.2 (L) 12.0 - 15.0 g/dL   HCT 30.1 (L) 36.0 - 46.0 %   MCV 89.1 80.0 - 100.0 fL   MCH 30.2 26.0 - 34.0 pg   MCHC 33.9 30.0 - 36.0 g/dL   RDW 13.3 11.5 - 15.5 %   Platelets 240 150 - 400 K/uL   nRBC 0.0 0.0 - 0.2 %   Neutrophils Relative % 50 %   Neutro Abs 2.7 1.7 - 7.7 K/uL   Lymphocytes Relative 39 %   Lymphs Abs 2.1 0.7 - 4.0 K/uL   Monocytes Relative 9 %   Monocytes Absolute 0.5 0.1 - 1.0 K/uL   Eosinophils Relative 1 %   Eosinophils Absolute 0.1 0.0 -  0.5 K/uL   Basophils Relative 1 %   Basophils Absolute 0.0 0.0 - 0.1 K/uL   Immature Granulocytes 0 %   Abs Immature Granulocytes 0.01 0.00 - 0.07 K/uL  Comprehensive metabolic panel     Status: Abnormal   Collection Time: 08/21/21 11:23 PM  Result Value Ref Range   Sodium 137 135 - 145 mmol/L   Potassium 3.7 3.5 - 5.1 mmol/L   Chloride 103 98 - 111 mmol/L   CO2 22 22 - 32 mmol/L   Glucose, Bld 100 (H) 70 - 99 mg/dL   BUN 25 (H) 8 - 23 mg/dL   Creatinine, Ser 2.29 (H)  0.44 - 1.00 mg/dL   Calcium 8.9 8.9 - 10.3 mg/dL   Total Protein 6.9 6.5 - 8.1 g/dL   Albumin 3.6 3.5 - 5.0 g/dL   AST 18 15 - 41 U/L   ALT 10 0 - 44 U/L   Alkaline Phosphatase 35 (L) 38 - 126 U/L   Total Bilirubin 0.5 0.3 - 1.2 mg/dL   GFR, Estimated 23 (L) >60 mL/min   Anion gap 12 5 - 15  Urinalysis, Routine w reflex microscopic Urine, Clean Catch     Status: Abnormal   Collection Time: 08/21/21 11:23 PM  Result Value Ref Range   Color, Urine YELLOW YELLOW   APPearance CLEAR CLEAR   Specific Gravity, Urine 1.015 1.005 - 1.030   pH 6.0 5.0 - 8.0   Glucose, UA NEGATIVE NEGATIVE mg/dL   Hgb urine dipstick NEGATIVE NEGATIVE   Bilirubin Urine NEGATIVE NEGATIVE   Ketones, ur NEGATIVE NEGATIVE mg/dL   Protein, ur 30 (A) NEGATIVE mg/dL   Nitrite NEGATIVE NEGATIVE   Leukocytes,Ua SMALL (A) NEGATIVE  Urinalysis, Microscopic (reflex)     Status: Abnormal   Collection Time: 08/21/21 11:23 PM  Result Value Ref Range   RBC / HPF 0-5 0 - 5 RBC/hpf   WBC, UA 11-20 0 - 5 WBC/hpf   Bacteria, UA RARE (A) NONE SEEN   Squamous Epithelial / LPF 0-5 0 - 5   Imaging Studies: CT Head Wo Contrast  Result Date: 08/21/2021 CLINICAL DATA:  Dizziness EXAM: CT HEAD WITHOUT CONTRAST TECHNIQUE: Contiguous axial images were obtained from the base of the skull through the vertex without intravenous contrast. COMPARISON:  CT brain 06/09/2021 FINDINGS: Brain: No acute territorial infarction, hemorrhage or intracranial mass. The  ventricles are nonenlarged. Mild atrophy. Vascular: No hyperdense vessels.  Carotid vascular calcification. Skull: Normal. Negative for fracture or focal lesion. Sinuses/Orbits: No acute finding. Other: None IMPRESSION: Negative non contrasted CT appearance of the brain for age Electronically Signed   By: Donavan Foil M.D.   On: 08/21/2021 23:42    ED COURSE and MDM  Nursing notes, initial and subsequent vitals signs, including pulse oximetry, reviewed and interpreted by myself.  Vitals:   08/21/21 1811 08/21/21 1813 08/21/21 2335  BP:  118/78 118/78  Pulse:  87 87  Resp:  16 18  Temp:  98.2 F (36.8 C)   TempSrc:  Oral   SpO2:  100% 100%  Weight: 70.3 kg    Height: 5\' 6"  (1.676 m)     Medications  fosfomycin (MONUROL) packet 3 g (has no administration in time range)    1:11 AM Laboratory studies are reassuring.  Patient's chronic kidney disease is known.  This likely is responsible for the anemia.  The cause of her burning sensation is unclear but may be related to the skin lesions as these presented about the same time.  She was advised to follow-up with her PCP as a biopsy may be indicated.  Her urinalysis shows possible UTI and we will treat with fosfomycin in the department.  PROCEDURES  Procedures   ED DIAGNOSES     ICD-10-CM   1. Acute cystitis without hematuria  N30.00     2. Skin lesions  L98.9     3. Burning sensation  R20.8          Shanon Rosser, MD 08/22/21 [phone]

## 2021-08-21 NOTE — ED Notes (Signed)
ED Provider at bedside. 

## 2021-08-21 NOTE — ED Notes (Signed)
Pt to CT at this time.

## 2021-08-21 NOTE — ED Triage Notes (Signed)
Pt states her " gi tract, mouth and eyes are burning like sulfuric acid ".  " My eyes nose, stomach and toes are on fire "

## 2021-08-22 LAB — COMPREHENSIVE METABOLIC PANEL
ALT: 10 U/L (ref 0–44)
AST: 18 U/L (ref 15–41)
Albumin: 3.6 g/dL (ref 3.5–5.0)
Alkaline Phosphatase: 35 U/L — ABNORMAL LOW (ref 38–126)
Anion gap: 12 (ref 5–15)
BUN: 25 mg/dL — ABNORMAL HIGH (ref 8–23)
CO2: 22 mmol/L (ref 22–32)
Calcium: 8.9 mg/dL (ref 8.9–10.3)
Chloride: 103 mmol/L (ref 98–111)
Creatinine, Ser: 2.29 mg/dL — ABNORMAL HIGH (ref 0.44–1.00)
GFR, Estimated: 23 mL/min — ABNORMAL LOW (ref >60)
Glucose, Bld: 100 mg/dL — ABNORMAL HIGH (ref 70–99)
Potassium: 3.7 mmol/L (ref 3.5–5.1)
Sodium: 137 mmol/L (ref 135–145)
Total Bilirubin: 0.5 mg/dL (ref 0.3–1.2)
Total Protein: 6.9 g/dL (ref 6.5–8.1)

## 2021-08-22 MED ORDER — FOSFOMYCIN TROMETHAMINE 3 G PO PACK
3.0000 g | PACK | Freq: Once | ORAL | Status: AC
  Administered 2021-08-22: 01:00:00 3 g via ORAL
  Filled 2021-08-22: qty 3

## 2021-08-23 ENCOUNTER — Emergency Department (HOSPITAL_BASED_OUTPATIENT_CLINIC_OR_DEPARTMENT_OTHER)
Admission: EM | Admit: 2021-08-23 | Discharge: 2021-08-23 | Disposition: A | Discharge status: Home / Self Care | Payer: Medicare PPO | Attending: Emergency Medicine | Admitting: Emergency Medicine

## 2021-08-23 ENCOUNTER — Other Ambulatory Visit: Payer: Self-pay

## 2021-08-23 ENCOUNTER — Encounter (HOSPITAL_BASED_OUTPATIENT_CLINIC_OR_DEPARTMENT_OTHER): Payer: Self-pay | Admitting: Emergency Medicine

## 2021-08-23 DIAGNOSIS — I5042 Chronic combined systolic (congestive) and diastolic (congestive) heart failure: Secondary | ICD-10-CM | POA: Diagnosis not present

## 2021-08-23 DIAGNOSIS — E1122 Type 2 diabetes mellitus with diabetic chronic kidney disease: Secondary | ICD-10-CM | POA: Diagnosis not present

## 2021-08-23 DIAGNOSIS — I130 Hypertensive heart and chronic kidney disease with heart failure and stage 1 through stage 4 chronic kidney disease, or unspecified chronic kidney disease: Secondary | ICD-10-CM | POA: Insufficient documentation

## 2021-08-23 DIAGNOSIS — Z79899 Other long term (current) drug therapy: Secondary | ICD-10-CM | POA: Diagnosis not present

## 2021-08-23 DIAGNOSIS — M791 Myalgia, unspecified site: Secondary | ICD-10-CM | POA: Insufficient documentation

## 2021-08-23 DIAGNOSIS — Z794 Long term (current) use of insulin: Secondary | ICD-10-CM | POA: Insufficient documentation

## 2021-08-23 DIAGNOSIS — M545 Low back pain, unspecified: Secondary | ICD-10-CM | POA: Insufficient documentation

## 2021-08-23 DIAGNOSIS — N184 Chronic kidney disease, stage 4 (severe): Secondary | ICD-10-CM | POA: Diagnosis not present

## 2021-08-23 DIAGNOSIS — I2510 Atherosclerotic heart disease of native coronary artery without angina pectoris: Secondary | ICD-10-CM | POA: Insufficient documentation

## 2021-08-23 DIAGNOSIS — Z8543 Personal history of malignant neoplasm of ovary: Secondary | ICD-10-CM | POA: Diagnosis not present

## 2021-08-23 LAB — COMPREHENSIVE METABOLIC PANEL
ALT: 12 U/L (ref 0–44)
AST: 19 U/L (ref 15–41)
Albumin: 3.6 g/dL (ref 3.5–5.0)
Alkaline Phosphatase: 36 U/L — ABNORMAL LOW (ref 38–126)
Anion gap: 11 (ref 5–15)
BUN: 27 mg/dL — ABNORMAL HIGH (ref 8–23)
CO2: 21 mmol/L — ABNORMAL LOW (ref 22–32)
Calcium: 9.2 mg/dL (ref 8.9–10.3)
Chloride: 107 mmol/L (ref 98–111)
Creatinine, Ser: 2.07 mg/dL — ABNORMAL HIGH (ref 0.44–1.00)
GFR, Estimated: 25 mL/min — ABNORMAL LOW (ref >60)
Glucose, Bld: 94 mg/dL (ref 70–99)
Potassium: 4 mmol/L (ref 3.5–5.1)
Sodium: 139 mmol/L (ref 135–145)
Total Bilirubin: 0.5 mg/dL (ref 0.3–1.2)
Total Protein: 7.3 g/dL (ref 6.5–8.1)

## 2021-08-23 LAB — CBC WITH DIFFERENTIAL/PLATELET
Abs Immature Granulocytes: 0.01 10*3/uL (ref 0.00–0.07)
Basophils Absolute: 0 10*3/uL (ref 0.0–0.1)
Basophils Relative: 1 %
Eosinophils Absolute: 0.1 10*3/uL (ref 0.0–0.5)
Eosinophils Relative: 1 %
HCT: 32.9 % — ABNORMAL LOW (ref 36.0–46.0)
Hemoglobin: 10.9 g/dL — ABNORMAL LOW (ref 12.0–15.0)
Immature Granulocytes: 0 %
Lymphocytes Relative: 38 %
Lymphs Abs: 2.2 10*3/uL (ref 0.7–4.0)
MCH: 29.5 pg (ref 26.0–34.0)
MCHC: 33.1 g/dL (ref 30.0–36.0)
MCV: 89.2 fL (ref 80.0–100.0)
Monocytes Absolute: 0.4 10*3/uL (ref 0.1–1.0)
Monocytes Relative: 7 %
Neutro Abs: 3 10*3/uL (ref 1.7–7.7)
Neutrophils Relative %: 53 %
Platelets: 284 10*3/uL (ref 150–400)
RBC: 3.69 MIL/uL — ABNORMAL LOW (ref 3.87–5.11)
RDW: 13.4 % (ref 11.5–15.5)
WBC: 5.6 10*3/uL (ref 4.0–10.5)
nRBC: 0 % (ref 0.0–0.2)

## 2021-08-23 LAB — URINALYSIS, ROUTINE W REFLEX MICROSCOPIC
Bilirubin Urine: NEGATIVE
Glucose, UA: NEGATIVE mg/dL
Hgb urine dipstick: NEGATIVE
Ketones, ur: NEGATIVE mg/dL
Nitrite: NEGATIVE
Protein, ur: 100 mg/dL — AB
Specific Gravity, Urine: 1.025 (ref 1.005–1.030)
pH: 6 (ref 5.0–8.0)

## 2021-08-23 LAB — URINALYSIS, MICROSCOPIC (REFLEX)

## 2021-08-23 LAB — LIPASE, BLOOD: Lipase: 44 U/L (ref 11–51)

## 2021-08-23 MED ORDER — LIDOCAINE VISCOUS HCL 2 % MT SOLN
15.0000 mL | Freq: Once | OROMUCOSAL | Status: AC
  Administered 2021-08-23: 15 mL via ORAL
  Filled 2021-08-23 (×2): qty 15

## 2021-08-23 MED ORDER — LORAZEPAM 1 MG PO TABS
0.5000 mg | ORAL_TABLET | Freq: Once | ORAL | Status: AC
  Administered 2021-08-23: 0.5 mg via ORAL
  Filled 2021-08-23: qty 1

## 2021-08-23 MED ORDER — ACETAMINOPHEN 325 MG PO TABS
650.0000 mg | ORAL_TABLET | Freq: Once | ORAL | Status: AC
  Administered 2021-08-23: 650 mg via ORAL
  Filled 2021-08-23: qty 2

## 2021-08-23 MED ORDER — ALUM & MAG HYDROXIDE-SIMETH 200-200-20 MG/5ML PO SUSP
30.0000 mL | Freq: Once | ORAL | Status: AC
  Administered 2021-08-23: 30 mL via ORAL
  Filled 2021-08-23 (×2): qty 30

## 2021-08-23 NOTE — ED Notes (Signed)
Patient refused GI cocktail.

## 2021-08-23 NOTE — ED Notes (Addendum)
Patient now agreeable to take GI cocktail.  Medication administered to patient per order.

## 2021-08-23 NOTE — ED Notes (Signed)
Patient discharged to home.  All discharge instructions reviewed.  Patient verbalized understanding via teachback method.  VS WDL.  Respirations even and unlabored.  Ambulatory out of ED.   °

## 2021-08-23 NOTE — Discharge Instructions (Addendum)
Please take Tylenol 1000 mg every 6 hours as needed for pain.  Please follow-up with your primary care provider.  Please continue take your medications that was prescribed at your last visit.

## 2021-08-23 NOTE — ED Provider Notes (Signed)
[facility]PARTMENT Provider Note   CSN: [id number] Arrival date & time: 08/23/21  1424     History Chief Complaint  Patient presents with   Back Pain    Teresa Poole is a 70 y.o. female.   Back Pain Associated symptoms: no abdominal pain, no chest pain, no dysuria, no fever and no headaches   Patient is a 70 year old female with past medical history detailed in [place] below.  She is presented to emergency room today with complaints of "burning sensation from her eyes and mouth all the way through her entire "GI tract "to her anus.  She states that it is a burning sensation.  She describes a burning sensation as if sulfuric acid was burning her.  She states that her symptoms seem to have been going on off and on for the past few days.  She initially did not recall that she has been seen for this before however on my prompting does recollect that she was seen 2 days ago.  She also describes some achy low back pain no nausea vomiting diarrhea no chest pain lightheadedness dizziness and no urinary frequency urgency dysuria or hematuria.  She states she has been taking the medication she was prescribed at her last visit.  She has not been seen at her PCP office for the symptoms.  No other associated symptoms that she is aware of.     Past Medical History:  Diagnosis Date   Allergy    Anemia    CAD (coronary artery disease)    non-obstructive by LHC (09/25/2013): Proximal and mid LAD serial 20%, proximal circumflex 30%, mid AV groove circumflex 30%, mid RCA mild plaque.   Cardiomyopathy, hypertensive ([place]) 09/18/2013   Carotid stenosis    a. Carotid US (09/26/2011): Bilateral 1-39% ICA.   Chronic combined systolic and diastolic CHF (congestive heart failure) (HCC)    CKD (chronic kidney disease), stage III ([place]) 09/24/2013   Pt at [place], Dr. Joelyn Oms   Diabetes mellitus without complication [facility])    Edema 09/14/2013   GERD (gastroesophageal reflux  disease)    History of ovarian cancer    Hyperlipidemia    Hypertension    Obesity (BMI 30-39.9)    Other primary cardiomyopathies 10/26/2013   Renal artery stenosis ([place]) 10/11/2018   Thyroid disease    Seen by specialist    Patient Active Problem List   Diagnosis Date Noted   Hyponatremia 06/10/2021   Adjustment disorder with anxiety 06/10/2021   Chest pain of uncertain etiology 64/40/3474   Renal artery stenosis ([place]) 10/11/2018   Mixed hyperlipidemia 10/26/2013   Chronic combined systolic and diastolic heart failure ([place]) 09/28/2013   Stage 4 chronic kidney disease ([place]) 09/24/2013   Hypertensive urgency 09/18/2013   Cardiomyopathy, hypertensive ([place]) 09/18/2013   Edema 09/14/2013   Acute combined systolic and diastolic congestive heart failure ([place]) 09/09/2013   Hypertensive heart disease with CHF (congestive heart failure) ([place]) 09/09/2013   Uncontrolled hypertension 09/07/2013   Diabetes mellitus type II, uncontrolled 09/07/2013   Obesity (BMI 30-39.9)    History of ovarian cancer     Past Surgical History:  Procedure Laterality Date   LEFT HEART CATHETERIZATION WITH CORONARY ANGIOGRAM N/A 09/25/2013   Procedure: LEFT HEART CATHETERIZATION WITH CORONARY ANGIOGRAM;  Surgeon: Burnell Blanks, MD;  Location: [facility] CATH LAB;  Service: Cardiovascular;  Laterality: N/A;   RENAL ANGIOGRAPHY N/A 10/06/2018   Procedure: RENAL ANGIOGRAPHY;  Surgeon: Marty Heck, MD;  Location: [facility]  CV LAB;  Service: Cardiovascular;  Laterality: N/A;     OB History   No obstetric history on file.     Family History  Problem Relation Age of Onset   Diabetes Father    Hypertension Father    Hypertension Maternal Grandfather     Social History   Tobacco Use   Smoking status: Never   Smokeless tobacco: Never  Vaping Use   Vaping Use: Never used  Substance Use Topics   Alcohol use: No   Drug use: No    Home Medications Prior to Admission medications    Medication Sig Start Date End Date Taking? Authorizing Provider  cycloSPORINE, PF, (CEQUA) 0.09 % SOLN Place 1 drop into both eyes in the morning and at bedtime. 12/18/20   [provider]  ezetimibe (ZETIA) 10 MG tablet Take 10 mg by mouth daily. 06/09/21   [provider]  glucose blood (ONE TOUCH ULTRA TEST) test strip Use to check blood sugar 3 times daily Dx code E11.65 11/20/14   Elayne Snare, MD  lisinopril (PRINIVIL,ZESTRIL) 40 MG tablet Take 1 tablet (40 mg total) by mouth daily. 10/10/18   Copland, Gay Filler, MD  NIFEdipine (ADALAT CC) 60 MG 24 hr tablet Take 1 tablet (60 mg total) by mouth daily. 07/09/21   Daleen Bo, MD  omeprazole (PRILOSEC) 40 MG capsule Take 40 mg by mouth 2 (two) times daily. 06/08/21   [provider]  ONETOUCH VERIO test strip [place] DAILY 03/30/18   Elayne Snare, MD  OZEMPIC, 0.25 OR 0.5 MG/DOSE, 2 MG/1.5ML SOPN Inject 0.25 mg into the skin once a week. Sundays 05/12/21   [provider]    Allergies    Hydralazine hcl and Atorvastatin  Review of Systems   Review of Systems  Constitutional:  Negative for chills and fever.  HENT:  Negative for congestion.   Eyes:  Negative for pain.  Respiratory:  Negative for cough and shortness of breath.   Cardiovascular:  Negative for chest pain and leg swelling.  Gastrointestinal:  Negative for abdominal pain and vomiting.  Genitourinary:  Negative for dysuria.       "Burning sensation throughout GI tract"   Musculoskeletal:  Positive for back pain. Negative for myalgias.  Skin:  Negative for rash.  Neurological:  Negative for dizziness and headaches.   Physical Exam Updated Vital Signs BP (!) 170/89 (BP Location: Right Arm)    Pulse 82    Temp 98.3 F (36.8 C) (Oral)    Resp 16    Ht 5\' 6"  (1.676 m)    Wt 70.3 kg    SpO2 99%    BMI 25.02 kg/m   Physical Exam Vitals and nursing note reviewed.  Constitutional:      General: She is not in acute  distress.    Comments: Somewhat anxious appearing 71 year old female.  In no acute distress.  Able answer questions properly follow commands.  HENT:     Head: Normocephalic and atraumatic.     Nose: Nose normal.  Eyes:     General: No scleral icterus.    Comments: Eyes without conjunctival hemorrhage or conjunctivitis.  Sclera normal and clear.  No discharge  Cardiovascular:     Rate and Rhythm: Normal rate and regular rhythm.     Pulses: Normal pulses.     Heart sounds: Normal heart sounds.  Pulmonary:     Effort: Pulmonary effort is normal. No respiratory distress.     Breath  sounds: No wheezing.  Abdominal:     Palpations: Abdomen is soft.     Tenderness: There is no abdominal tenderness. There is no guarding or rebound.  Musculoskeletal:     Cervical back: Normal range of motion.     Right lower leg: No edema.     Left lower leg: No edema.  Skin:    General: Skin is warm and dry.     Capillary Refill: Capillary refill takes less than 2 seconds.  Neurological:     Mental Status: She is alert. Mental status is at baseline.  Psychiatric:        Mood and Affect: Mood normal.        Behavior: Behavior normal.    ED Results / Procedures / Treatments   Labs (all labs ordered are listed, but only abnormal results are displayed) Labs Reviewed  CBC WITH DIFFERENTIAL/PLATELET - Abnormal; Notable for the following components:      Result Value   RBC 3.69 (*)    Hemoglobin 10.9 (*)    HCT 32.9 (*)    All other components within normal limits  COMPREHENSIVE METABOLIC PANEL - Abnormal; Notable for the following components:   CO2 21 (*)    BUN 27 (*)    Creatinine, Ser 2.07 (*)    Alkaline Phosphatase 36 (*)    GFR, Estimated 25 (*)    All other components within normal limits  URINALYSIS, ROUTINE W REFLEX MICROSCOPIC - Abnormal; Notable for the following components:   Protein, ur 100 (*)    Leukocytes,Ua TRACE (*)    All other components within normal limits  URINALYSIS,  MICROSCOPIC (REFLEX) - Abnormal; Notable for the following components:   Bacteria, UA FEW (*)    All other components within normal limits  LIPASE, BLOOD    EKG None  Radiology CT Head Wo Contrast  Result Date: 08/21/2021 CLINICAL DATA:  Dizziness EXAM: CT HEAD WITHOUT CONTRAST TECHNIQUE: Contiguous axial images were obtained from the base of the skull through the vertex without intravenous contrast. COMPARISON:  CT brain 06/09/2021 FINDINGS: Brain: No acute territorial infarction, hemorrhage or intracranial mass. The ventricles are nonenlarged. Mild atrophy. Vascular: No hyperdense vessels.  Carotid vascular calcification. Skull: Normal. Negative for fracture or focal lesion. Sinuses/Orbits: No acute finding. Other: None IMPRESSION: Negative non contrasted CT appearance of the brain for age Electronically Signed   By: Donavan Foil M.D.   On: 08/21/2021 23:42    Procedures Procedures   Medications Ordered in ED Medications  alum & mag hydroxide-simeth (MAALOX/MYLANTA) 200-200-20 MG/5ML suspension 30 mL (30 mLs Oral Given 08/23/21 2147)    And  lidocaine (XYLOCAINE) 2 % viscous mouth solution 15 mL (15 mLs Oral Given 08/23/21 2148)  LORazepam (ATIVAN) tablet 0.5 mg (0.5 mg Oral Given 08/23/21 2054)  acetaminophen (TYLENOL) tablet 650 mg (650 mg Oral Given 08/23/21 2054)    ED Course  I have reviewed the triage vital signs and the nursing notes.  Pertinent labs & imaging results that were available during my care of the patient were reviewed by me and considered in my medical decision making (see chart for details).    MDM Rules/Calculators/A&P                          Patient is a 70 year old female with past medical history detailed in HPI presented emergency room for burning sensation that seems to include with her eyes or mouth and her "entire GI tract ".  She was seen 2 days ago for similar.  Had a reassuring work-up.  Today she is well-appearing on physical exam no  abdominal tenderness vital signs within normal with apart from hypertension  CMP unchanged from prior creatinine at baseline for her CKD.  CBC with ongoing anemia.  No significant change.  No leukocytosis.  Urinalysis unremarkable she is currently undergoing treatment for urinary tract infection and states that she is compliant with this.  Patient given half a tablet of Ativan also Tylenol and GI cocktail her symptoms have resolved at this time.  Lengthy discussion with her about the importance of PCP follow-up.  Return precautions given.  Will discharge home at this time.  I discussed this case with my attending physician who cosigned this note including patient's presenting symptoms, physical exam, and planned diagnostics and interventions. Attending physician stated agreement with plan or made changes to plan which were implemented.   Patient is also tolerating p.o. and ambulatory time of discharge.   Final Clinical Impression(s) / ED Diagnoses Final diagnoses:  Myalgia    Rx / DC Orders ED Discharge Orders     None        Tedd Sias, [place] 08/23/21 2300    Godfrey Pick, MD 08/24/21 [id number]

## 2021-08-23 NOTE — ED Triage Notes (Signed)
Pt c/o burning sensation to eyes, face and GI tract increased last night. Pt seen here for same on 08/21/2021.

## 2021-08-25 ENCOUNTER — Other Ambulatory Visit: Payer: Self-pay

## 2021-08-25 ENCOUNTER — Emergency Department (HOSPITAL_COMMUNITY)
Admission: EM | Admit: 2021-08-25 | Discharge: 2021-08-25 | Disposition: A | Discharge status: Home / Self Care | Payer: Medicare PPO | Attending: Emergency Medicine | Admitting: Emergency Medicine

## 2021-08-25 ENCOUNTER — Emergency Department (HOSPITAL_COMMUNITY): Payer: Medicare PPO

## 2021-08-25 ENCOUNTER — Encounter (HOSPITAL_COMMUNITY): Payer: Self-pay

## 2021-08-25 DIAGNOSIS — Z79899 Other long term (current) drug therapy: Secondary | ICD-10-CM | POA: Diagnosis not present

## 2021-08-25 DIAGNOSIS — R072 Precordial pain: Secondary | ICD-10-CM | POA: Diagnosis not present

## 2021-08-25 DIAGNOSIS — I130 Hypertensive heart and chronic kidney disease with heart failure and stage 1 through stage 4 chronic kidney disease, or unspecified chronic kidney disease: Secondary | ICD-10-CM | POA: Diagnosis not present

## 2021-08-25 DIAGNOSIS — I5042 Chronic combined systolic (congestive) and diastolic (congestive) heart failure: Secondary | ICD-10-CM | POA: Diagnosis not present

## 2021-08-25 DIAGNOSIS — I2510 Atherosclerotic heart disease of native coronary artery without angina pectoris: Secondary | ICD-10-CM | POA: Insufficient documentation

## 2021-08-25 DIAGNOSIS — Z8543 Personal history of malignant neoplasm of ovary: Secondary | ICD-10-CM | POA: Insufficient documentation

## 2021-08-25 DIAGNOSIS — N184 Chronic kidney disease, stage 4 (severe): Secondary | ICD-10-CM | POA: Insufficient documentation

## 2021-08-25 DIAGNOSIS — R0789 Other chest pain: Secondary | ICD-10-CM | POA: Diagnosis present

## 2021-08-25 DIAGNOSIS — E1122 Type 2 diabetes mellitus with diabetic chronic kidney disease: Secondary | ICD-10-CM | POA: Diagnosis not present

## 2021-08-25 HISTORY — DX: Atherosclerotic heart disease of native coronary artery without angina pectoris: I25.10

## 2021-08-25 HISTORY — DX: Type 2 diabetes mellitus without complications: E11.9

## 2021-08-25 HISTORY — DX: Unspecified right bundle-branch block: I45.10

## 2021-08-25 LAB — CBC
HCT: 31.3 % — ABNORMAL LOW (ref 36.0–46.0)
Hemoglobin: 10.3 g/dL — ABNORMAL LOW (ref 12.0–15.0)
MCH: 29.9 pg (ref 26.0–34.0)
MCHC: 32.9 g/dL (ref 30.0–36.0)
MCV: 91 fL (ref 80.0–100.0)
Platelets: 245 10*3/uL (ref 150–400)
RBC: 3.44 MIL/uL — ABNORMAL LOW (ref 3.87–5.11)
RDW: 13.3 % (ref 11.5–15.5)
WBC: 6.4 10*3/uL (ref 4.0–10.5)
nRBC: 0 % (ref 0.0–0.2)

## 2021-08-25 LAB — BASIC METABOLIC PANEL
Anion gap: 13 (ref 5–15)
BUN: 30 mg/dL — ABNORMAL HIGH (ref 8–23)
CO2: 24 mmol/L (ref 22–32)
Calcium: 9.3 mg/dL (ref 8.9–10.3)
Chloride: 105 mmol/L (ref 98–111)
Creatinine, Ser: 2.74 mg/dL — ABNORMAL HIGH (ref 0.44–1.00)
GFR, Estimated: 18 mL/min — ABNORMAL LOW (ref >60)
Glucose, Bld: 169 mg/dL — ABNORMAL HIGH (ref 70–99)
Potassium: 3.9 mmol/L (ref 3.5–5.1)
Sodium: 142 mmol/L (ref 135–145)

## 2021-08-25 LAB — TROPONIN I (HIGH SENSITIVITY)
Troponin I (High Sensitivity): 15 ng/L (ref <18)
Troponin I (High Sensitivity): 14 ng/L (ref <18)

## 2021-08-25 MED ORDER — SODIUM CHLORIDE 0.9 % IV BOLUS
500.0000 mL | Freq: Once | INTRAVENOUS | Status: AC
  Administered 2021-08-25: 500 mL via INTRAVENOUS

## 2021-08-25 MED ORDER — SODIUM CHLORIDE 0.9 % IV BOLUS: 1000.0000 mL | Freq: Once | INTRAVENOUS | Status: DC

## 2021-08-25 MED ORDER — LIDOCAINE VISCOUS HCL 2 % MT SOLN
15.0000 mL | Freq: Once | OROMUCOSAL | Status: AC
  Administered 2021-08-25: 15 mL via ORAL
  Filled 2021-08-25: qty 15

## 2021-08-25 MED ORDER — ALUM & MAG HYDROXIDE-SIMETH 200-200-20 MG/5ML PO SUSP
30.0000 mL | Freq: Once | ORAL | Status: AC
  Administered 2021-08-25: 30 mL via ORAL
  Filled 2021-08-25: qty 30

## 2021-08-25 MED ORDER — FAMOTIDINE IN NACL 20-0.9 MG/50ML-% IV SOLN
20.0000 mg | Freq: Once | INTRAVENOUS | Status: AC
Start: 2021-08-25 — End: 2021-08-25
  Administered 2021-08-25: 20 mg via INTRAVENOUS
  Filled 2021-08-25: qty 50

## 2021-08-25 MED ORDER — NITROGLYCERIN 0.4 MG SL SUBL
0.4000 mg | SUBLINGUAL_TABLET | SUBLINGUAL | Status: DC | PRN
  Administered 2021-08-25: 0.4 mg via SUBLINGUAL
  Filled 2021-08-25: qty 1

## 2021-08-25 NOTE — ED Provider Notes (Signed)
[facility] EMERGENCY DEPARTMENT Provider Note   CSN: [id number] Arrival date & time: 08/25/21  4174     History  Chief Complaint  Patient presents with   Chest Pain    Teresa Poole is a 71 y.o. female with a PMHx of DM, HTN, HLD, CAD, who presents to the Emergency Department complaining of 6/10, left-sided, CP onset 3 days ago. Her left sided chest pain radiates to her left arm. She notes that her left sided chest pain is similar to a "pressure sensation from left chest to back, like a gas bubble." Pt was resting when she noticed her left sided chest pain. She denies sick contacts. Denies recent heavy lifting, trauma, or injury. She has associated shortness of breath and lightheadedness. She was given ASA by EMS for her symptoms. She denies shortness of breath, fever, chills, abdominal pain, nausea, vomiting, back pain, palpitations. Denies sick contacts. Denies recent immobilization, surgery, HRT, OCPs, history of malignancy, or anticoagulant use. Denies prior MI or cardiac cath. Pt is retired and lives at home alone. Pt had ovarian cancer 36 years ago and it was surgically.  Patient has not followed up with her cardiologist.  She has not taken her prescription medications this morning.  Per patient chart review: Patient was evaluated in the ED on 08/23/2021 with back pain and burning sensation of her entire GI tract.  She was treated with GI cocktail, Tylenol, Ativan with relief of her symptoms.  Patient has a follow-up appointment with cardiology on 09/05/2021.   Past Medical History:  Diagnosis Date   Allergy    Anemia    Carotid stenosis    Chronic combined systolic and diastolic CHF (congestive heart failure) (HCC)    CKD (chronic kidney disease), stage IV ([place]) 09/24/2013   Pt at [place], Dr. Joelyn Oms   Diabetes mellitus type 2 in nonobese [facility])    Edema 09/14/2013   GERD (gastroesophageal reflux disease)    History of ovarian cancer     Hyperlipidemia    Hypertension    Mild CAD    non-obstructive by LHC (09/25/2013): Proximal and mid LAD serial 20%, proximal circumflex 30%, mid AV groove circumflex 30%, mid RCA mild plaque.   NICM (nonischemic cardiomyopathy) (HCC)    Obesity (BMI 30-39.9)    RBBB    Renal artery stenosis ([place]) 10/11/2018   Thyroid disease    Seen by specialist     The history is provided by the patient. No language interpreter was used.  Chest Pain Pain location:  L chest Pain quality: pressure   Pain radiates to:  L arm Pain severity:  Mild Onset quality:  Gradual Duration:  3 days Timing:  Intermittent Progression:  Unchanged Chronicity:  New Relieved by:  Nothing Worsened by:  Nothing Ineffective treatments:  Antacids Associated symptoms: heartburn   Associated symptoms: no abdominal pain, no dizziness, no fever, no nausea, no palpitations, no shortness of breath, no syncope and no vomiting   Risk factors: coronary artery disease, diabetes mellitus, high cholesterol and hypertension    HPI: A 71 year old patient with a history of treated diabetes and hypertension presents for evaluation of chest pain. Initial onset of pain was less than one hour ago. The patient's chest pain is not worse with exertion. The patient's chest pain is middle- or left-sided, is not well-localized, is not described as heaviness/pressure/tightness, is not sharp and does radiate to the arms/jaw/neck. The patient does not complain of nausea and denies diaphoresis. The patient has  no history of stroke, has no history of peripheral artery disease, has not smoked in the past 90 days, has no relevant family history of coronary artery disease (first degree relative at less than age 32), has no history of hypercholesterolemia and does not have an elevated BMI (>=30).   Home Medications Prior to Admission medications   Medication Sig Start Date End Date Taking? Authorizing Provider  carvedilol (COREG) 25 MG tablet Take 25 mg  by mouth 2 (two) times daily with a meal.   Yes [provider]  cycloSPORINE, PF, (CEQUA) 0.09 % SOLN Place 1 drop into both eyes in the morning and at bedtime. 12/18/20  Yes [provider]  ezetimibe (ZETIA) 10 MG tablet Take 10 mg by mouth daily. 06/09/21  Yes [provider]  glucose blood (ONE TOUCH ULTRA TEST) test strip Use to check blood sugar 3 times daily Dx code E11.65 Patient taking differently: 1 each by Other route See admin instructions. Use to check blood sugar 3 times daily Dx code E11.65 11/20/14  Yes Elayne Snare, MD  lisinopril (PRINIVIL,ZESTRIL) 40 MG tablet Take 1 tablet (40 mg total) by mouth daily. 10/10/18  Yes Copland, Gay Filler, MD  NIFEdipine (ADALAT CC) 60 MG 24 hr tablet Take 1 tablet (60 mg total) by mouth daily. 07/09/21  Yes Daleen Bo, MD  omeprazole (PRILOSEC) 40 MG capsule Take 40 mg by mouth 2 (two) times daily. 06/08/21  Yes [provider]  ONETOUCH VERIO test strip [place] Patient taking differently: 1 each by Other route in the morning, at noon, and at bedtime. 03/30/18  Yes Elayne Snare, MD  OZEMPIC, 0.25 OR 0.5 MG/DOSE, 2 MG/1.5ML SOPN Inject 0.25 mg into the skin once a week. Sundays 05/12/21  Yes [provider]  cephALEXin (KEFLEX) 500 MG capsule Take 500 mg by mouth 3 (three) times daily. 08/15/21   [provider]      Allergies    Hydralazine hcl and Atorvastatin    Review of Systems   Review of Systems  Constitutional:  Negative for chills and fever.  Respiratory:  Negative for shortness of breath.   Cardiovascular:  Positive for chest pain. Negative for palpitations and syncope.  Gastrointestinal:  Positive for heartburn. Negative for abdominal pain, nausea and vomiting.  Neurological:  Negative for dizziness.  All other systems reviewed and are negative.  Physical Exam Updated Vital Signs BP (!) 144/71    Pulse 90    Temp 97.7 F (36.5 C) (Oral)    Resp 16     Ht 5\' 6"  (1.676 m)    Wt 70.3 kg    SpO2 100%    BMI 25.02 kg/m  Physical Exam Vitals and nursing note reviewed.  Constitutional:      General: She is not in acute distress.    Appearance: She is not diaphoretic.  HENT:     Head: Normocephalic and atraumatic.     Mouth/Throat:     Pharynx: No oropharyngeal exudate.  Eyes:     General: No scleral icterus.    Conjunctiva/sclera: Conjunctivae normal.  Cardiovascular:     Rate and Rhythm: Normal rate and regular rhythm.     Pulses: Normal pulses.     Heart sounds: Normal heart sounds.     Comments: Radial pulses 2+ bilaterally. Pulmonary:     Effort: Pulmonary effort is normal. No respiratory distress.     Breath sounds: Normal breath sounds. No wheezing.  Chest:  Chest wall: No tenderness.     Comments: No chest wall TTP. Abdominal:     General: Bowel sounds are normal.     Palpations: Abdomen is soft. There is no mass.     Tenderness: There is no abdominal tenderness. There is no guarding or rebound.  Musculoskeletal:        General: Normal range of motion.     Cervical back: Normal range of motion and neck supple.  Skin:    General: Skin is warm and dry.  Neurological:     Mental Status: She is alert.  Psychiatric:        Behavior: Behavior normal.    ED Results / Procedures / Treatments   Labs (all labs ordered are listed, but only abnormal results are displayed) Labs Reviewed  BASIC METABOLIC PANEL - Abnormal; Notable for the following components:      Result Value   Glucose, Bld 169 (*)    BUN 30 (*)    Creatinine, Ser 2.74 (*)    GFR, Estimated 18 (*)    All other components within normal limits  CBC - Abnormal; Notable for the following components:   RBC 3.44 (*)    Hemoglobin 10.3 (*)    HCT 31.3 (*)    All other components within normal limits  TROPONIN I (HIGH SENSITIVITY)  TROPONIN I (HIGH SENSITIVITY)    EKG EKG Interpretation  Date/Time:  Monday August 25 2021 06:21:57 EST Ventricular  Rate:  70 PR Interval:  183 QRS Duration: 165 QT Interval:  446 QTC Calculation: 482 R Axis:   -18 Text Interpretation: Sinus rhythm Right bundle branch block No significant change since last tracing Confirmed by Ripley Fraise [id number]) on 08/25/2021 6:35:58 AM  Radiology DG Chest Port 1 View  Result Date: 08/25/2021 CLINICAL DATA:  Chest pain EXAM: PORTABLE CHEST 1 VIEW COMPARISON:  06/09/2021 FINDINGS: Normal heart size and mediastinal contours. No acute infiltrate or edema. No effusion or pneumothorax. No acute osseous findings. IMPRESSION: Negative chest. Electronically Signed   By: Jorje Guild M.D.   On: 08/25/2021 07:06    Procedures Procedures   Medications Ordered in ED Medications  nitroGLYCERIN (NITROSTAT) SL tablet 0.4 mg (0 mg Sublingual Hold 08/25/21 0843)  alum & mag hydroxide-simeth (MAALOX/MYLANTA) 200-200-20 MG/5ML suspension 30 mL (30 mLs Oral Given 08/25/21 0750)    And  lidocaine (XYLOCAINE) 2 % viscous mouth solution 15 mL (15 mLs Oral Given 08/25/21 0750)  famotidine (PEPCID) IVPB 20 mg premix (0 mg Intravenous Stopped 08/25/21 0832)  sodium chloride 0.9 % bolus 500 mL (0 mLs Intravenous Stopped 08/25/21 1144)    ED Course/ Medical Decision Making/ A&P Clinical Course as of 08/25/21 1418  Mon Aug 25, 2021  0732 Case discussed with attending who agrees with NTG at this time. If improvement, then proceed with cardiologist consult. If not, then morphine and cardiologist [SB]  0854 Pt hypotensive with SL NTG, will add on bolus of fluids [SB]  0930 Blood pressures improved following bolus of fluids. [SB]  [id number] Patient reevaluated and noted to be resting comfortably on stretcher.  Systolic blood pressure at 118. [SB]  [id number] Consult with cardiologist, Dr. Johney Frame. Cardiologist will evaluate the patient in the ED.  Patient notified. [SB]  1103 Cardiology team notified that patient can be managed in the outpatient setting with follow up and possible outpatient stress testing.  Also recommended to hold patients lisinopril due to significant AKI. [SB]  1226 Patient evaluated prior to discharge and notified of  discharge treatment plan.  Patient agreeable at this time.  Patient appears safe for discharge. [SB]    Clinical Course User Index [SB] Claudia Alvizo A, PA-C   HEAR Score: 6                       Medical Decision Making  This patient presents to the ED for concern of left sided chest pain, this involves an extensive number of treatment options, and is a complaint that carries with it a high risk of complications and morbidity.  The differential diagnosis includes ACS, PE, PNA, PTX, COVID, flu.    Co morbidities that complicate the patient evaluation HTN, DM, HLD, CAD, CKD (stent placement in 2021), LHC (2015)   Additional history obtained: External records from outside source obtained and reviewed including: Patient was evaluated in the ED on 08/23/2021 with back pain and burning sensation of her entire GI tract.  She was treated with GI cocktail, Tylenol, Ativan with relief of her symptoms.  Patient has a follow-up appointment with cardiology on 09/05/2021."   Lab Tests:  I ordered, and personally interpreted labs.  The pertinent results include: Troponins 15 and 14.  CBC without elevated WBC, hemoglobin stable at 10.3.  BMP with elevated glucose at 169.  Creatinine elevated 2.74 and BUN elevated at 30, slightly uptrending from baseline.   Imaging Studies ordered: I ordered imaging studies including chest xray I independently visualized and interpreted imaging which showed no acute cardiopulmonary findings consistent of pneumonia, pneumothorax, or pulmonary effusion.  I agree with the radiologist interpretation    Cardiac Monitoring:   The patient was maintained on a cardiac monitor.  I personally viewed and interpreted the cardiac monitored which showed an underlying rhythm of: normal sinus rhythm.    Medicines ordered and prescription drug  management: I ordered medication including Pepcid, Maalox, Xylocaine for indigestion.  Patient also received nitroglycerin due to her ongoing chest pain in the ED. Reevaluation of the patient after these medicines showed that the patient stayed the same I have reviewed the patients home medicines and have made adjustments as needed  Critical Interventions: Given 500 mL NS bolus to treat hypotension in the ED with relief.    Consultations Obtained: I requested consultation with the cardiologist, Dr. Johney Frame,  and discussed lab and imaging findings as well as pertinent plan - they recommend: Cardiology evaluation in the ED.   Problem List / ED Course: Clinical Course as of 08/25/21 1418  Mon Aug 25, 2021  0732 Case discussed with attending who agrees with NTG at this time. If improvement, then proceed with cardiologist consult. If not, then morphine and cardiologist [SB]  0854 Pt hypotensive with SL NTG, will add on bolus of fluids [SB]  0930 Blood pressures improved following bolus of fluids. [SB]  [id number] Patient reevaluated and noted to be resting comfortably on stretcher.  Systolic blood pressure at 118. [SB]  [id number] Consult with cardiologist, Dr. Johney Frame. Cardiologist will evaluate the patient in the ED.  Patient notified. [SB]  1103 Cardiology team notified that patient can be managed in the outpatient setting with follow up and possible outpatient stress testing. Also recommended to hold patients lisinopril due to significant AKI. [SB]  1226 Patient evaluated prior to discharge and notified of discharge treatment plan.  Patient agreeable at this time.  Patient appears safe for discharge. [SB]    Clinical Course User Index [SB] Laylynn Campanella A, PA-C     Reevaluation: After the interventions noted  above, I reevaluated the patient and found that they have :resolved   Social Determinants of Health:  Patient lives at home alone and is currently retired.   Dispostion:  After  consideration of the diagnostic results and the patients response to treatment, I feel that the patent would benefit from discharge home with Cardiology follow up within a week.  Patient has a follow-up appointment with [facility] cardiology on 09/05/2021.  Discussed this with [facility] cardiology group who notes that they agree with outpatient management. [facility] cardiology group recommends holding lisinopril due to the patient's elevated creatinine here.  This chart was dictated using voice recognition software, Dragon. Despite the best efforts of this provider to proofread and correct errors, errors may still occur which can change documentation meaning. Final Clinical Impression(s) / ED Diagnoses Final diagnoses:  Atypical chest pain    Rx / DC Orders ED Discharge Orders     None         Nabor Thomann A, PA-C 08/25/21 1418    Horton, Alvin Critchley, DO 08/25/21 1545

## 2021-08-25 NOTE — ED Notes (Signed)
Pt reports she is going to pass out after taking the nitroglycerin. Pt stating, "I am going to blank out. Please God. Its going to kill me." Pt is crying hysterically at this time. Blue PA is at the bedside at this time. Pt is now hypotensive at 85/52.

## 2021-08-25 NOTE — ED Triage Notes (Signed)
[facility] bib [facility] c/o 6/10 L sided chest pain radiating down L arm, SOB, and dizziness upon standing. Was seen last week at [facility] for similar symptoms. 324mg  ASA given PTA. BP 106/56, O2 99% RA, CBG 180

## 2021-08-25 NOTE — Discharge Instructions (Addendum)
It was a pleasure taking care of you today!  Your work-up in the emergency department was negative.  You were evaluated by cardiology in the emergency department and recommended that she follow-up in the outpatient setting.  Please hold on taking your lisinopril for 1 week, you may continue taking your lisinopril on 09/01/2021. You may follow-up with your primary care provider as needed.  Please ensure to maintain all scheduled follow-up appointments.  Return to the ED if you are experiencing increasing/worsening chest pain, trouble breathing, worsening symptoms.

## 2021-08-25 NOTE — Consult Note (Addendum)
Cardiology Consultation:   Patient ID: Teresa Poole MRN: [id number]; DOB: 1951/06/10  Admit date: 08/25/2021 Date of Consult: 08/25/2021  PCP:  Teresa Coma., MD   [facility] Providers Cardiologist:  Larae Grooms, MD        Patient Profile:   Teresa Poole is a 71 y.o. female with a hx of NICM with chronic combined CHF, mild nonobstructive CAD by cath 2015, anemia, CKD stage IV), HTN, HLD, RBBB, GERD, carotid stenosis (7-32% KGUR/42-70% LICA 01/2375) and L renal artery stenosis s/p stenting 09/2018 (managed by vascular), thyroid disease who is being seen 08/25/2021 for the evaluation of chest pain at the request of Dr. Dina Rich.  History of Present Illness:   Ms. Teresa Poole was originally seen by our team in 2015 for chest pain, cardiomyopathy with EF 30-35%, and abnormal nuclear stress test. She underwent cardiac cath showing nonobstructive CAD with serial 20% prox and mid LAD stenosis, 30% prox Cx stenosis, 30% mid AV groove Cx stenosis. Cardiomyopathy was deemed nonischemic and she was treated medically. She has a history of renal artery stenting by vascular in 2020. Her last admission was in 05/2021 with atypical chest pain amongst other symptoms including "burning all over," anxiety, and fatigue. Troponins were minimally elevated to peak of 35. She was found to have hyponatremia that was felt due to poor oral intake. Indapamide was discontinued. She also was recommended to f/u PCP and possibly psychiatry as she had many family stressors going on. 2D echo 05/2021 showed EF 55-60%, mild hypokinesis in the basal inferior wall with normal wall motion in the remaining myocardial wall segments (EF significantly improved from 2015), mild LVH, grade 1 DD, mild aortic sclerosis without stenosis. She has been seen in the ER several times over the last few months for various issues including head rushes, tightness in neck, feeling like GI tract is on fire, feeling acid in her kidneys, skin  lesions, and disorientation. Her last ED visit was 08/23/21 with burning sensation from her eyes and mouth all the way through her entire GI tract to her anus as if sulfuric acid was burning her. She was treated with Ativan, Tylenol and GI cocktail with improvement and discharged home.  She returned to the ED today again with symptoms of severe discomfort she describes as "sulfuric acid" that she states is present in her saliva/secretions, in her eyeballs, chest, abdomen, back, legs, and toes. She states this has been going on for 3 years and at this point is 24/7 all the time, all day, every day for several weeks, most prominent the last 5 days. She recalls having these symptoms in [place] many, many years ago and being told she had some sort of growth in her chest and back. She does not recall having this worked up further. She used to take an acid medicine but states it stopped working along the way. She reports it is extreme and constant and unrelenting. It is not aggravated by exertion or inspiration. She has been having some night sweats, headache, and bloating. She also hears a high pitched ringing in her ears. She has had some feelings of dizziness when getting up in the middle of the night to urinate. No edema, dyspnea, syncope, nausea, vomiting, or diarrhea. Upon arrival her BP has been normal to low normal at times but otherwise not tachycardic or hypoxic. She was given GI cocktail and IV Pepcid. She was then given SL NTG which made her feel worse and produced hypotension to 85 systolic.  She was then treated with 500cc saline bolus. Labs notable for elevated troponin 15->14, AKI on CKD stage IV with Cr 2.74 (up from prior value of 2.07 but variable baseline recently from 2.0-2.6), chronic appearing anemia with Hgb 10.3 (baseline appears around 9-11 range). CXR NAD.  Past Medical History:  Diagnosis Date   Allergy    Anemia    Carotid stenosis    Chronic combined systolic and diastolic CHF  (congestive heart failure) (HCC)    CKD (chronic kidney disease), stage IV ([place]) 09/24/2013   Pt at [place], Dr. Joelyn Oms   Diabetes mellitus type 2 in nonobese [facility])    Edema 09/14/2013   GERD (gastroesophageal reflux disease)    History of ovarian cancer    Hyperlipidemia    Hypertension    Mild CAD    non-obstructive by LHC (09/25/2013): Proximal and mid LAD serial 20%, proximal circumflex 30%, mid AV groove circumflex 30%, mid RCA mild plaque.   NICM (nonischemic cardiomyopathy) (HCC)    Obesity (BMI 30-39.9)    RBBB    Renal artery stenosis ([place]) 10/11/2018   Thyroid disease    Seen by specialist    Past Surgical History:  Procedure Laterality Date   LEFT HEART CATHETERIZATION WITH CORONARY ANGIOGRAM N/A 09/25/2013   Procedure: LEFT HEART CATHETERIZATION WITH CORONARY ANGIOGRAM;  Surgeon: Burnell Blanks, MD;  Location: [facility] CATH LAB;  Service: Cardiovascular;  Laterality: N/A;   RENAL ANGIOGRAPHY N/A 10/06/2018   Procedure: RENAL ANGIOGRAPHY;  Surgeon: Marty Heck, MD;  Location: [place] CV LAB;  Service: Cardiovascular;  Laterality: N/A;     Home Medications:  Prior to Admission medications   Medication Sig Start Date End Date Taking? Authorizing Provider  cycloSPORINE, PF, (CEQUA) 0.09 % SOLN Place 1 drop into both eyes in the morning and at bedtime. 12/18/20   [provider]  ezetimibe (ZETIA) 10 MG tablet Take 10 mg by mouth daily. 06/09/21   [provider]  glucose blood (ONE TOUCH ULTRA TEST) test strip Use to check blood sugar 3 times daily Dx code E11.65 11/20/14   Elayne Snare, MD  lisinopril (PRINIVIL,ZESTRIL) 40 MG tablet Take 1 tablet (40 mg total) by mouth daily. 10/10/18   Copland, Gay Filler, MD  NIFEdipine (ADALAT CC) 60 MG 24 hr tablet Take 1 tablet (60 mg total) by mouth daily. 07/09/21   Daleen Bo, MD  omeprazole (PRILOSEC) 40 MG capsule Take 40 mg by mouth 2 (two) times daily. 06/08/21   [provider]   ONETOUCH VERIO test strip [place] DAILY 03/30/18   Elayne Snare, MD  OZEMPIC, 0.25 OR 0.5 MG/DOSE, 2 MG/1.5ML SOPN Inject 0.25 mg into the skin once a week. Sundays 05/12/21   [provider]    Inpatient Medications: Scheduled Meds:  Continuous Infusions:  PRN Meds: nitroGLYCERIN  Allergies:    Allergies  Allergen Reactions   Hydralazine Hcl Itching and Other (See Comments)    ENTIRE BODY = burning sensation, also in the eyes (they have become very sensitive to light)   Atorvastatin Other (See Comments)    Per MD    Social History:   Social History   Socioeconomic History   Marital status: Single    Spouse name: Not on file   Number of children: 0   Years of education: Not on file   Highest education level: Not on file  Occupational History    Employer: [employer]  Tobacco Use  Smoking status: Never   Smokeless tobacco: Never  Vaping Use   Vaping Use: Never used  Substance and Sexual Activity   Alcohol use: No   Drug use: No   Sexual activity: Not on file  Other Topics Concern   Not on file  Social History Narrative   Works at [employer]   Patient lives at home alone.    Patient has no children.    Patient patient is right handed.    Patient is single.    Social Determinants of Health   Financial Resource Strain: Not on file  Food Insecurity: Not on file  Transportation Needs: Not on file  Physical Activity: Not on file  Stress: Not on file  Social Connections: Not on file  Intimate Partner Violence: Not on file    Family History:   Family History  Problem Relation Age of Onset   Diabetes Father    Hypertension Father    Hypertension Maternal Grandfather      ROS:  Please see the history of present illness.  All other ROS reviewed and negative.     Physical Exam/Data:   Vitals:   08/25/21 0945 08/25/21 1015 08/25/21 1030 08/25/21 1045  BP: 118/60 126/63 128/61 116/61  Pulse: 73 74 76 70  Resp: 16 13 13 17    Temp:      TempSrc:      SpO2: 97% 100% 100% 100%  Weight:      Height:       No intake or output data in the 24 hours ending 08/25/21 1107 Last 3 Weights 08/25/2021 08/23/2021 08/21/2021  Weight (lbs) 155 lb 155 lb 155 lb  Weight (kg) 70.308 kg 70.308 kg 70.308 kg     Body mass index is 25.02 kg/m.  General: Well developed, well nourished AAF in no acute distress. Head: Normocephalic, atraumatic, sclera non-icteric, no xanthomas, nares are without discharge. Neck: Negative for carotid bruits. JVP not elevated. Lungs: Clear bilaterally to auscultation without wheezes, rales, or rhonchi. Breathing is unlabored. Heart: RRR S1 S2 without murmurs, rubs, or gallops.  Abdomen: Soft, non-tender, non-distended with normoactive bowel sounds. No rebound/guarding. Extremities: No clubbing or cyanosis. No edema. Distal pedal pulses are 2+ and equal bilaterally. Neuro: Alert and oriented X 3. Moves all extremities spontaneously. Psych:  Responds to questions appropriately with a normal affect then becomes tearful when discussing her symptoms.   EKG:  The EKG was personally reviewed and demonstrates:  NSR 70bpm, RBBB, nonspecific TW changes similar to prior.  Telemetry:  Telemetry was personally reviewed and demonstrates:  NSR  Relevant CV Studies:        2D Echo 06/11/21   1. Left ventricular ejection fraction, by estimation, is 55 to 60%. The  left ventricle has normal function. The left ventricle demonstrates mild  hypokinesis in the basal inferior wall with normal wall motion in the  remaining myocardial wall segments.  There is mild concentric left ventricular hypertrophy. Left ventricular  diastolic parameters are consistent with Grade I diastolic dysfunction  (impaired relaxation).   2. Right ventricular systolic function is normal. The right ventricular  size is normal.   3. The mitral valve is normal in structure. No evidence of mitral valve  regurgitation. No evidence of mitral  stenosis.   4. The aortic valve is normal in structure. Aortic valve regurgitation is  trivial. Mild aortic valve sclerosis is present, with no evidence of  aortic valve stenosis.   5. The inferior vena cava is normal in size  with greater than 50%  respiratory variability, suggesting right atrial pressure of 3 mmHg.   Cath 2015 Procedure Performed:  Left Heart Catheterization Selective Coronary Angiography   Operator: Lauree Chandler, MD   Arterial access site:  Right radial artery.    Indication:  71 y.o. female with a history of HLD, uncontrolled HTN, diabetes mellitus and cardiomyopathy with an EF of 30-35% on recent echocardiogram who was admitted with shortness of breath, volume overload. Stress myoview with possible ischemia.                            Procedure Details: The risks, benefits, complications, treatment options, and expected outcomes were discussed with the patient. The patient and/or family concurred with the proposed plan, giving informed consent. The patient was brought to the cath lab after IV hydration was begun and oral premedication was given. The patient was further sedated with Versed and Fentanyl. The right wrist was assessed with an Allens test which was positive. The right wrist was prepped and draped in a sterile fashion. 1% lidocaine was used for local anesthesia. Using the modified Seldinger access technique, a 5 French sheath was placed in the right radial artery. 3 mg Verapamil was given through the sheath. 4500 units IV heparin was given. Standard diagnostic catheters were used to perform selective coronary angiography. A pigtail catheter was used to measure LV pressures. The sheath was removed from the right radial artery and a Terumo hemostasis band was applied at the arteriotomy site on the right wrist.     There were no immediate complications. The patient was taken to the recovery area in stable condition.    Hemodynamic Findings: Central aortic  pressure: 130/63 Left ventricular pressure: 130/15/29   Angiographic Findings:   Left main:  Short segment, no obstructive disease.    Left Anterior Descending Artery: Large caliber vessel that courses to the apex. The proximal and mid vessel has serial 20% stenoses. There are several small diagonal branches.    Circumflex Artery: Large caliber vessel that gives off a large obtuse marginal branch with proximal 30% stenosis. The mid AV groove Circumflex has 30% stenosis.    Right Coronary Artery: Moderate caliber non-dominant vessel with mild mid plaque.    Left Ventricular Angiogram: Deferred.    Impression: 1. Non-obstructive CAD 2. Non-ischemic cardiomyopathy   Recommendations: Will continue diuresis. Continue medical management of CAD.        Complications:  None. The patient tolerated the procedure well.            Laboratory Data:  High Sensitivity Troponin:   Recent Labs  Lab 08/25/21 0636 08/25/21 0837  TROPONINIHS 15 14     Chemistry Recent Labs  Lab 08/21/21 2323 08/23/21 2040 08/25/21 0636  NA 137 139 142  K 3.7 4.0 3.9  CL 103 107 105  CO2 22 21* 24  GLUCOSE 100* 94 169*  BUN 25* 27* 30*  CREATININE 2.29* 2.07* 2.74*  CALCIUM 8.9 9.2 9.3  GFRNONAA 23* 25* 18*  ANIONGAP 12 11 13     Recent Labs  Lab 08/21/21 2323 08/23/21 2040  PROT 6.9 7.3  ALBUMIN 3.6 3.6  AST 18 19  ALT 10 12  ALKPHOS 35* 36*  BILITOT 0.5 0.5   Lipids No results for input(s): CHOL, TRIG, HDL, LABVLDL, LDLCALC, CHOLHDL in the last 168 hours.  Hematology Recent Labs  Lab 08/21/21 2323 08/23/21 2040 08/25/21 0636  WBC 5.3 5.6 6.4  RBC 3.38* 3.69* 3.44*  HGB 10.2* 10.9* 10.3*  HCT 30.1* 32.9* 31.3*  MCV 89.1 89.2 91.0  MCH 30.2 29.5 29.9  MCHC 33.9 33.1 32.9  RDW 13.3 13.4 13.3  PLT 240 284 245   Radiology/Studies:  CT Head Wo Contrast  Result Date: 08/21/2021 CLINICAL DATA:  Dizziness EXAM: CT HEAD WITHOUT CONTRAST TECHNIQUE: Contiguous axial images were  obtained from the base of the skull through the vertex without intravenous contrast. COMPARISON:  CT brain 06/09/2021 FINDINGS: Brain: No acute territorial infarction, hemorrhage or intracranial mass. The ventricles are nonenlarged. Mild atrophy. Vascular: No hyperdense vessels.  Carotid vascular calcification. Skull: Normal. Negative for fracture or focal lesion. Sinuses/Orbits: No acute finding. Other: None IMPRESSION: Negative non contrasted CT appearance of the brain for age Electronically Signed   By: Donavan Foil M.D.   On: 08/21/2021 23:42   DG Chest Port 1 View  Result Date: 08/25/2021 CLINICAL DATA:  Chest pain EXAM: PORTABLE CHEST 1 VIEW COMPARISON:  06/09/2021 FINDINGS: Normal heart size and mediastinal contours. No acute infiltrate or edema. No effusion or pneumothorax. No acute osseous findings. IMPRESSION: Negative chest. Electronically Signed   By: Jorje Guild M.D.   On: 08/25/2021 07:06     Assessment and Plan:   1. Atypical chest pain with multitude of somatic complaints of unclear etiology - from cardiac standpoint, despite weeks of unrelenting diffuse body burning, troponin remains negative at this time and EKG unchanged from prior - symptoms of diffuse sulfuric acid burning in eyeballs, saliva, secretions, back, chest, abdomen, legs and toes does not fit with ischemic etiology - per d/w Dr. Johney Frame, recommend medical investigation of other causes, ? GI workup - we will arrange OP clinic follow-up at which time stress testing can be considered  2. Dizziness with hypotension - received IV fluids in ER, BP now improved - recommend holding lisinopril and f/u nephrology  3. AKI on CKD stage IV - ?aggravated by hypotension - recently had to hold indapamide - recommend holding lisinopril and f/u nephrology  3. H/o NICM/chronic combined CHF - most recent echo showed normal EF - euvolemic on exam without decompensation - CXR clear - continue to follow as OP  4. Mild CAD by  cath in 2015 - troponins negative/reassuring, plan as above  5. Renal artery stenosis and carotid stenosis, managed by [facility] - f/u vascular surgery as outpatient  Patient seen in conjunction with Dr. Johney Frame who examined patient. See additional thoughts below. I have sent a message to our office's scheduling team requesting a follow-up appointment, and our office will call the patient with this information. The EDP did note that she does already have a cardiology appointment scheduled with [facility] team on 09/05/21. I've asked our scheduling team to clarify with patient when they call her who she plans to follow with moving forward so we are on the same page. If [place], we will arrange follow-up as planned.   Risk Assessment/Risk Scores:     HEAR Score (for undifferentiated chest pain):  HEAR Score: 6  New York Heart Association (NYHA) Functional Class NYHA Class I-II   For questions or updates, please contact [place] Please consult [email or web address] for contact info under    Signed, Charlie Pitter, PA-C  08/25/2021 11:07 AM  Patient seen and examined and agree with Melina Copa, PA-C as detailed above.   In brief, there is a  71 y.o. female with a hx of NICM with chronic combined CHF, mild nonobstructive CAD by cath 2015,  anemia, CKD stage IV), HTN, HLD, RBBB, GERD, carotid stenosis (3-71% IRCV/89-38% LICA 08/173) and L renal artery stenosis s/p stenting 09/2018 (managed by vascular), thyroid disease who is being seen 08/25/2021 for the evaluation of chest pain at the request of Dr. Dina Rich.  The patient was initially seen by Cardiology in 2015 for ches tpain with EF 30-35% and abnormal nuc. She underwent cath which showed nonobstructive CAD with serial 20% prox and mid LAD stenosis, 30% prox Cx stenosis, 30% mid AV groove Cx stenosis. Cardiomyopathy was deemed nonischemic and she was treated medically. TTE in 05/2021 showed improved EF 55-60%, mild hypokinesis in the basal inferior wall with normal  wall motion in the remaining myocardial wall segments (EF significantly improved from 2015), mild LVH, grade 1 DD, mild aortic sclerosis without stenosis.    She has been seen several times in the ER for burning sensation from her eyes to her toes with last visit 08/23/21. She represented today again with the burning sensation all over including her chest. She describes it as "sulfuric acid" in her secretions and center of her chest. States this has been ongoing for 3 years. Symptoms are constant with no alleviating or aggrevating factors. Symptoms not exertional. In the ER, labs notable for  troponin 15->14, AKI on CKD stage IV with Cr 2.74 (up from prior value of 2.07 but variable baseline recently from 2.0-2.6), chronic appearing anemia with Hgb 10.3 (baseline appears around 9-11 range). CXR NAD. ECG NSR, RBBB, nonspecific T-wave changes which are unchanged.   Overall, symptoms do not sound cardiac in nature and seem to have been ongoing for "years" according to the patient. Work-up here has been unremarkable. No further ischemic work-up needed at this time. Has CV follow-up on 09/05/21.  GEN: Anxious appearing, NAD  Neck: No JVD Cardiac: RRR, no murmurs, rubs, or gallops.  Respiratory: Clear to auscultation bilaterally. GI: Soft, nontender, non-distended  MS: No edema; No deformity. Neuro:  Nonfocal  Psych: Tearful when discussing symptoms  Plan: -Atypical chest pain; no further cardiac work-up needed as inpatient -Has arrange CV follow-up on 09/05/21 -? If would benefit from GI evaluation given burning in her throat/chest although also reports total body burning -Agree with holding lisinopril on discharge given AKI and mild hypotension on arrival to ED; can resume as able as out-patient  Gwyndolyn Kaufman, MD

## 2021-08-28 ENCOUNTER — Encounter (HOSPITAL_COMMUNITY): Payer: Self-pay | Admitting: Emergency Medicine

## 2021-08-28 ENCOUNTER — Other Ambulatory Visit: Payer: Self-pay

## 2021-08-28 ENCOUNTER — Emergency Department (HOSPITAL_COMMUNITY): Payer: Medicare PPO

## 2021-08-28 ENCOUNTER — Emergency Department (HOSPITAL_COMMUNITY)
Admission: EM | Admit: 2021-08-28 | Discharge: 2021-08-28 | Disposition: A | Discharge status: Home / Self Care | Payer: Medicare PPO | Attending: Emergency Medicine | Admitting: Emergency Medicine

## 2021-08-28 DIAGNOSIS — I700 Atherosclerosis of aorta: Secondary | ICD-10-CM | POA: Diagnosis not present

## 2021-08-28 DIAGNOSIS — R208 Other disturbances of skin sensation: Secondary | ICD-10-CM | POA: Diagnosis present

## 2021-08-28 DIAGNOSIS — Z7982 Long term (current) use of aspirin: Secondary | ICD-10-CM | POA: Diagnosis not present

## 2021-08-28 DIAGNOSIS — E119 Type 2 diabetes mellitus without complications: Secondary | ICD-10-CM | POA: Diagnosis not present

## 2021-08-28 DIAGNOSIS — H9319 Tinnitus, unspecified ear: Secondary | ICD-10-CM | POA: Insufficient documentation

## 2021-08-28 DIAGNOSIS — R002 Palpitations: Secondary | ICD-10-CM | POA: Insufficient documentation

## 2021-08-28 DIAGNOSIS — K219 Gastro-esophageal reflux disease without esophagitis: Secondary | ICD-10-CM | POA: Diagnosis not present

## 2021-08-28 DIAGNOSIS — R300 Dysuria: Secondary | ICD-10-CM | POA: Diagnosis not present

## 2021-08-28 DIAGNOSIS — R350 Frequency of micturition: Secondary | ICD-10-CM | POA: Diagnosis not present

## 2021-08-28 DIAGNOSIS — Z79899 Other long term (current) drug therapy: Secondary | ICD-10-CM | POA: Diagnosis not present

## 2021-08-28 DIAGNOSIS — I110 Hypertensive heart disease with heart failure: Secondary | ICD-10-CM | POA: Diagnosis not present

## 2021-08-28 DIAGNOSIS — I509 Heart failure, unspecified: Secondary | ICD-10-CM | POA: Insufficient documentation

## 2021-08-28 LAB — URINALYSIS, ROUTINE W REFLEX MICROSCOPIC
Bacteria, UA: NONE SEEN
Bilirubin Urine: NEGATIVE
Glucose, UA: NEGATIVE mg/dL
Hgb urine dipstick: NEGATIVE
Ketones, ur: 5 mg/dL — AB
Leukocytes,Ua: NEGATIVE
Nitrite: NEGATIVE
Protein, ur: 100 mg/dL — AB
Specific Gravity, Urine: 1.014 (ref 1.005–1.030)
pH: 6 (ref 5.0–8.0)

## 2021-08-28 LAB — BASIC METABOLIC PANEL
Anion gap: 8 (ref 5–15)
BUN: 25 mg/dL — ABNORMAL HIGH (ref 8–23)
CO2: 25 mmol/L (ref 22–32)
Calcium: 8.8 mg/dL — ABNORMAL LOW (ref 8.9–10.3)
Chloride: 108 mmol/L (ref 98–111)
Creatinine, Ser: 2.06 mg/dL — ABNORMAL HIGH (ref 0.44–1.00)
GFR, Estimated: 26 mL/min — ABNORMAL LOW (ref >60)
Glucose, Bld: 134 mg/dL — ABNORMAL HIGH (ref 70–99)
Potassium: 3.8 mmol/L (ref 3.5–5.1)
Sodium: 141 mmol/L (ref 135–145)

## 2021-08-28 LAB — CBC
HCT: 30.4 % — ABNORMAL LOW (ref 36.0–46.0)
Hemoglobin: 9.7 g/dL — ABNORMAL LOW (ref 12.0–15.0)
MCH: 29.7 pg (ref 26.0–34.0)
MCHC: 31.9 g/dL (ref 30.0–36.0)
MCV: 93 fL (ref 80.0–100.0)
Platelets: 261 10*3/uL (ref 150–400)
RBC: 3.27 MIL/uL — ABNORMAL LOW (ref 3.87–5.11)
RDW: 13.3 % (ref 11.5–15.5)
WBC: 4.7 10*3/uL (ref 4.0–10.5)
nRBC: 0 % (ref 0.0–0.2)

## 2021-08-28 LAB — SALICYLATE LEVEL: Salicylate Lvl: <7 mg/dL — ABNORMAL LOW (ref 7.0–30.0)

## 2021-08-28 MED ORDER — SUCRALFATE 1 G PO TABS: 1.0000 g | ORAL_TABLET | Freq: Three times a day (TID) | ORAL | 0 refills | Status: DC

## 2021-08-28 NOTE — ED Notes (Signed)
Patient complains of ear ringing worse then when she arrived. Alert and oriented

## 2021-08-28 NOTE — ED Provider Notes (Signed)
[facility] EMERGENCY DEPARTMENT Provider Note   CSN: [id number] Arrival date & time: 08/28/21  0900     History  Chief Complaint  Patient presents with   Palpitations   Tinnitus    CHANIYAH JAHR is a 71 y.o. female here for evaluation of "sulfuric acid" sensation from her head to her toes.  States she has had this for quite some time however cannot quantify.  She has been seen for this previously.  Denies any paresthesias, weakness.  Was seen here 3 days ago for chest pain.  She denies any chest pain currently.  States few days ago she did have palpitations which seems to be timeline wise when she was seen in the ED.  Cardiology saw her at that time and they told her to follow-up outpatient.  Patient also admits to "sirens" in her ears.  Has been taking intermittent aspirin because "the heart doctor told me to."  She does not know how much she takes.  She denies any pulsatile tinnitus, neck pain, neck stiffness, vision changes, headache, unilateral weakness, numbness, abdominal pain, back pain, bowel or bladder incontinence.  Does state that she is having urinary frequency and some mild dysuria without any hematuria.  No flank pain.  No vaginal discharge, concern for STDs.  HPI     Home Medications Prior to Admission medications   Medication Sig Start Date End Date Taking? Authorizing Provider  sucralfate (CARAFATE) 1 g tablet Take 1 tablet (1 g total) by mouth 4 (four) times daily -  with meals and at bedtime for 14 days. 08/28/21 09/11/21 Yes Nychelle Cassata A, PA-C  carvedilol (COREG) 25 MG tablet Take 25 mg by mouth 2 (two) times daily with a meal.    [provider]  cephALEXin (KEFLEX) 500 MG capsule Take 500 mg by mouth 3 (three) times daily. 08/15/21   [provider]  cycloSPORINE, PF, (CEQUA) 0.09 % SOLN Place 1 drop into both eyes in the morning and at bedtime. 12/18/20   [provider]  ezetimibe (ZETIA) 10 MG tablet Take 10 mg  by mouth daily. 06/09/21   [provider]  glucose blood (ONE TOUCH ULTRA TEST) test strip Use to check blood sugar 3 times daily Dx code E11.65 Patient taking differently: 1 each by Other route See admin instructions. Use to check blood sugar 3 times daily Dx code E11.65 11/20/14   Elayne Snare, MD  lisinopril (PRINIVIL,ZESTRIL) 40 MG tablet Take 1 tablet (40 mg total) by mouth daily. 10/10/18   Copland, Gay Filler, MD  NIFEdipine (ADALAT CC) 60 MG 24 hr tablet Take 1 tablet (60 mg total) by mouth daily. 07/09/21   Daleen Bo, MD  omeprazole (PRILOSEC) 40 MG capsule Take 40 mg by mouth 2 (two) times daily. 06/08/21   [provider]  ONETOUCH VERIO test strip [place] Patient taking differently: 1 each by Other route in the morning, at noon, and at bedtime. 03/30/18   Elayne Snare, MD  OZEMPIC, 0.25 OR 0.5 MG/DOSE, 2 MG/1.5ML SOPN Inject 0.25 mg into the skin once a week. Sundays 05/12/21   [provider]      Allergies    Hydralazine hcl and Atorvastatin    Review of Systems   Review of Systems  Constitutional: Negative.   HENT: Negative.    Respiratory: Negative.    Cardiovascular: Negative.   Gastrointestinal: Negative.   Genitourinary:  Positive for frequency and urgency.  Musculoskeletal: Negative.  Skin: Negative.   Neurological:        Burning from head to toe  All other systems reviewed and are negative.  Physical Exam Updated Vital Signs BP (!) 168/73    Pulse 81    Temp 98.5 F (36.9 C) (Oral)    Resp 18    Ht 5\' 6"  (1.676 m)    Wt 70 kg    SpO2 100%    BMI 24.91 kg/m  Physical Exam Vitals and nursing note reviewed.  Constitutional:      General: She is not in acute distress.    Appearance: She is well-developed. She is not ill-appearing, toxic-appearing or diaphoretic.  HENT:     Head: Normocephalic and atraumatic.     Jaw: There is normal jaw occlusion.     Right Ear: Tympanic membrane and ear canal normal.      Left Ear: Tympanic membrane and ear canal normal.     Nose: Nose normal.     Mouth/Throat:     Lips: Pink.     Mouth: Mucous membranes are moist.     Pharynx: Oropharynx is clear.     Tonsils: No tonsillar exudate or tonsillar abscesses.  Eyes:     Pupils: Pupils are equal, round, and reactive to light.  Neck:     Trachea: Trachea and phonation normal.     Comments: No bruit Cardiovascular:     Rate and Rhythm: Normal rate.     Pulses: Normal pulses.          Radial pulses are 2+ on the right side and 2+ on the left side.       Dorsalis pedis pulses are 2+ on the right side and 2+ on the left side.     Heart sounds: Normal heart sounds.  Pulmonary:     Effort: Pulmonary effort is normal. No respiratory distress.     Breath sounds: Normal breath sounds.  Chest:     Comments: Non tender Abdominal:     General: Bowel sounds are normal. There is no distension.     Palpations: Abdomen is soft.     Tenderness: There is no abdominal tenderness. There is no right CVA tenderness, left CVA tenderness, guarding or rebound.     Comments: Soft non tender. No rebound or guarding  Musculoskeletal:        General: No swelling, tenderness, deformity or signs of injury. Normal range of motion.     Cervical back: Full passive range of motion without pain and normal range of motion.     Right lower leg: No edema.     Left lower leg: No edema.     Comments: Full ROM. No bony tenderness. No midline C/T/L tenderness  Lymphadenopathy:     Cervical: No cervical adenopathy.  Skin:    General: Skin is warm and dry.     Capillary Refill: Capillary refill takes less than 2 seconds.     Comments: No rashes or lesions  Neurological:     General: No focal deficit present.     Mental Status: She is alert and oriented to person, place, and time.     Cranial Nerves: Cranial nerves 2-12 are intact.     Sensory: Sensation is intact.     Motor: Motor function is intact.     Coordination: Coordination is  intact.     Gait: Gait is intact.     Comments: CN 2-12 grossly intact Ambulatory to RR without ataxic gait Intact sensation  Equal strength Bl  Psychiatric:        Mood and Affect: Mood normal.    ED Results / Procedures / Treatments   Labs (all labs ordered are listed, but only abnormal results are displayed) Labs Reviewed  BASIC METABOLIC PANEL - Abnormal; Notable for the following components:      Result Value   Glucose, Bld 134 (*)    BUN 25 (*)    Creatinine, Ser 2.06 (*)    Calcium 8.8 (*)    GFR, Estimated 26 (*)    All other components within normal limits  CBC - Abnormal; Notable for the following components:   RBC 3.27 (*)    Hemoglobin 9.7 (*)    HCT 30.4 (*)    All other components within normal limits  SALICYLATE LEVEL - Abnormal; Notable for the following components:   Salicylate Lvl <4.9 (*)    All other components within normal limits  URINALYSIS, ROUTINE W REFLEX MICROSCOPIC - Abnormal; Notable for the following components:   Ketones, ur 5 (*)    Protein, ur 100 (*)    All other components within normal limits    EKG None  Radiology DG Chest 2 View  Result Date: 08/28/2021 CLINICAL DATA:  Palpitations, chest tightness, RIGHT-sided numbness today, history CHF, diabetes mellitus, hypertension, GERD EXAM: CHEST - 2 VIEW COMPARISON:  08/25/2021 FINDINGS: Normal heart size, mediastinal contours, and pulmonary vascularity. Atherosclerotic calcification aorta. Lungs clear. No infiltrate, pleural effusion, or pneumothorax. No acute osseous findings. IMPRESSION: No acute abnormalities. Aortic Atherosclerosis (ICD10-I70.0). Electronically Signed   By: Lavonia Dana M.D.   On: 08/28/2021 09:49    Procedures Procedures    Medications Ordered in ED Medications - No data to display  ED Course/ Medical Decision Making/ A&P                           Medical Decision Making Amount and/or Complexity of Data Reviewed Independent Historian: parent Labs: ordered.  Decision-making details documented in ED Course. ECG/medicine tests: ordered and independent interpretation performed. Decision-making details documented in ED Course.  Risk OTC drugs. Diagnosis or treatment significantly limited by social determinants of health.   Pleasant 71 year old here for evaluation of multiple complaints.  Complaint seems to been longstanding with her most pressing concern of burning from head to toe.    Urinary freq and dysria>will obtain a urine.  Neg CVA tap> abdomen soft, nontender, benign abdominal exam.  Low suspicion for pyelonephritis, acute intra-abdominal surgical pathology such as diverticulitis, bowel obstruction, cholecystitis, appendicitis, AAA, dissection.  Burning sensation from head to toe, unclear etiology.  Has been seen multiple times previously.  She has no rashes to suggest zoster, infectious process.  She has a nonfocal neuro exam without deficits, low suspicion for acute CVA, dissection.  She is neurovascularly intact, ambulatory without ataxic gait.  Does admit to some tinnitus, has been taking more aspirin than normal, will obtain salicylate level.  Palpitations (days ago)> no current chest pain, palpitations.  Does not appear fluid overloaded on exam.  No clinical evidence of VTE.  States this occurred a few days ago which would coincide with her prior ED visit where she was seen by cardiology, cleared at that time for outpatient follow-up.  EKG here today personally reviewed and interpreted does not show any ischemic changes.  At this time I low suspicion for acute ACS, PE, dissection.   Labs personally reviewed and interpreted:  CBC without leukocytosis, hemoglobin  9.4, similar to prior labs in Epic BMP creatinine 2.06, improved from prior Chest x-ray without acute infiltrates, cardiomegaly, pulm edema, pneumothorax EKG with RBBB, similar to prior UA neg for infection Salicylate WNL  Patient reassessed.  States her tenderness has  resolved.  Unclear etiology of her intermittent tenderness however low suspicion for dissection, she has no bruit on exam.  Aspirin within normal limit.  We will have her follow-up outpatient with her PCP, states she has appointment tomorrow with them as well as giving her part of her cardiology on the 13th.  She will return for new or worsening symptoms.  The patient has been appropriately medically screened and/or stabilized in the ED. I have low suspicion for any other emergent medical condition which would require further screening, evaluation or treatment in the ED or require inpatient management.  Patient is hemodynamically stable and in no acute distress.  Patient able to ambulate in department prior to ED.  Evaluation does not show acute pathology that would require ongoing or additional emergent interventions while in the emergency department or further inpatient treatment.  I have discussed the diagnosis with the patient and answered all questions.  Pain is been managed while in the emergency department and patient has no further complaints prior to discharge.  Patient is comfortable with plan discussed in room and is stable for discharge at this time.  I have discussed strict return precautions for returning to the emergency department.  Patient was encouraged to follow-up with PCP/specialist refer to at discharge.         Final Clinical Impression(s) / ED Diagnoses Final diagnoses:  Burning sensation  Palpitations    Rx / DC Orders ED Discharge Orders          Ordered    sucralfate (CARAFATE) 1 g tablet  3 times daily with meals & bedtime        08/28/21 1828              Nare Gaspari A, PA-C 08/28/21 1831    Dorie Rank, MD 08/28/21 2340

## 2021-08-28 NOTE — ED Triage Notes (Signed)
IV 20g in Left [facility]

## 2021-08-28 NOTE — Discharge Instructions (Signed)
Work-up here in the emergency department was reassuring  Return for new or worsening symptoms, follow-up with primary care provider and cardiology

## 2021-08-29 ENCOUNTER — Emergency Department (HOSPITAL_BASED_OUTPATIENT_CLINIC_OR_DEPARTMENT_OTHER)
Admission: EM | Admit: 2021-08-29 | Discharge: 2021-08-29 | Disposition: A | Discharge status: Home / Self Care | Payer: Medicare PPO | Attending: Emergency Medicine | Admitting: Emergency Medicine

## 2021-08-29 ENCOUNTER — Emergency Department (HOSPITAL_BASED_OUTPATIENT_CLINIC_OR_DEPARTMENT_OTHER): Payer: Medicare PPO

## 2021-08-29 ENCOUNTER — Encounter (HOSPITAL_BASED_OUTPATIENT_CLINIC_OR_DEPARTMENT_OTHER): Payer: Self-pay

## 2021-08-29 ENCOUNTER — Telehealth: Payer: Self-pay | Admitting: Interventional Cardiology

## 2021-08-29 ENCOUNTER — Emergency Department (HOSPITAL_COMMUNITY): Payer: Medicare PPO

## 2021-08-29 DIAGNOSIS — I5042 Chronic combined systolic (congestive) and diastolic (congestive) heart failure: Secondary | ICD-10-CM | POA: Insufficient documentation

## 2021-08-29 DIAGNOSIS — Z8543 Personal history of malignant neoplasm of ovary: Secondary | ICD-10-CM | POA: Insufficient documentation

## 2021-08-29 DIAGNOSIS — Z20822 Contact with and (suspected) exposure to covid-19: Secondary | ICD-10-CM | POA: Insufficient documentation

## 2021-08-29 DIAGNOSIS — Z79899 Other long term (current) drug therapy: Secondary | ICD-10-CM | POA: Diagnosis not present

## 2021-08-29 DIAGNOSIS — I130 Hypertensive heart and chronic kidney disease with heart failure and stage 1 through stage 4 chronic kidney disease, or unspecified chronic kidney disease: Secondary | ICD-10-CM | POA: Insufficient documentation

## 2021-08-29 DIAGNOSIS — I2510 Atherosclerotic heart disease of native coronary artery without angina pectoris: Secondary | ICD-10-CM | POA: Diagnosis not present

## 2021-08-29 DIAGNOSIS — R200 Anesthesia of skin: Secondary | ICD-10-CM | POA: Diagnosis not present

## 2021-08-29 DIAGNOSIS — E1122 Type 2 diabetes mellitus with diabetic chronic kidney disease: Secondary | ICD-10-CM | POA: Diagnosis not present

## 2021-08-29 DIAGNOSIS — N184 Chronic kidney disease, stage 4 (severe): Secondary | ICD-10-CM | POA: Insufficient documentation

## 2021-08-29 LAB — CBG MONITORING, ED: Glucose-Capillary: 157 mg/dL — ABNORMAL HIGH (ref 70–99)

## 2021-08-29 LAB — RESP PANEL BY RT-PCR (FLU A&B, COVID) ARPGX2
Influenza A by PCR: NEGATIVE
Influenza B by PCR: NEGATIVE
SARS Coronavirus 2 by RT PCR: NEGATIVE

## 2021-08-29 NOTE — ED Notes (Signed)
Report given to [employer]

## 2021-08-29 NOTE — ED Notes (Signed)
Signature pad in room 5 is not working   Pt. Attempted to sign x 2 with no result.  Verbal consent accepted.

## 2021-08-29 NOTE — Telephone Encounter (Signed)
Spoke with pt who complains of numbness on the right side of her body since 5 am and is now having difficulty talking. She is also complaining of GI burning.  She has not taken her BP this morning.  She was seen in the ED yesterday for similar symptoms.  Pt advised to hang up and call 911 now due to current symptoms of numbness and speech difficulty on her right side.  Pt verbalizes understanding and agrees with current plan.

## 2021-08-29 NOTE — ED Notes (Signed)
Pt. Reports she was seen at the [facility] ER yesterday and was given a medication for Ulcers.  Pt. Said she was taken by Ambulance yesterday.    Pt. Speaks clear. And moves all extremities well.

## 2021-08-29 NOTE — ED Notes (Addendum)
Assumed care from [facility]. Patient laying quietly on gurney. No acute distress noted. Patient states symptoms have not resolved at this time but her only complaint is that she is cold. Patient given a warm blanket. Strong and even pushes/pulls noted. No facial droop noted. Patient updated on plan of care. Will continue to monitor.   1234: Patient ambulated to and from restroom without assistance. Patient calm/cooperative and resting on gurney at this time. Will continue to monitor.

## 2021-08-29 NOTE — Discharge Instructions (Addendum)
It was a pleasure taking care of you today. As discussed, your MRI was normal. I have laced a referral to neurology. They will call you within the next week to schedule an appointment. Follow-up with PCP within 1 week for further evaluation. Return to the ER for new or worsening symptoms.

## 2021-08-29 NOTE — ED Provider Notes (Signed)
[facility] Provider Note   CSN: [id number] Arrival date & time: 08/29/21  1660     History  Chief Complaint  Patient presents with   Numbness    Teresa Poole is a 71 y.o. female with a PMHx of diabetes, hypertension, hyperlipidemia, who presents to the ED complaining of right-sided numbness onset this morning prior to arrival.  Patient went to bed at 10 PM last night and woke up at 5:30 AM this morning and noticed that she had numbness to her right side.  Patient has been able to ambulate since.  She has not tried any medications for her symptoms.  Denies chest pain, shortness of breath, fever, chills, tingling, weakness, color change, swelling, wound. Denies history of CVA or TIA.  Denies history of MI.   Past Medical History:  Diagnosis Date   Allergy    Anemia    Carotid stenosis    Chronic combined systolic and diastolic CHF (congestive heart failure) (HCC)    CKD (chronic kidney disease), stage IV ([place]) 09/24/2013   Pt at [facility], Dr. Joelyn Oms   Diabetes mellitus type 2 in nonobese [facility])    Edema 09/14/2013   GERD (gastroesophageal reflux disease)    History of ovarian cancer    Hyperlipidemia    Hypertension    Mild CAD    non-obstructive by LHC (09/25/2013): Proximal and mid LAD serial 20%, proximal circumflex 30%, mid AV groove circumflex 30%, mid RCA mild plaque.   NICM (nonischemic cardiomyopathy) (HCC)    Obesity (BMI 30-39.9)    RBBB    Renal artery stenosis ([place]) 10/11/2018   Thyroid disease    Seen by specialist     The history is provided by the patient. No language interpreter was used.      Home Medications Prior to Admission medications   Medication Sig Start Date End Date Taking? Authorizing Provider  carvedilol (COREG) 25 MG tablet Take 25 mg by mouth 2 (two) times daily with a meal.    [provider]  cephALEXin (KEFLEX) 500 MG capsule Take 500 mg by mouth 3 (three) times daily. 08/15/21    [provider]  cycloSPORINE, PF, (CEQUA) 0.09 % SOLN Place 1 drop into both eyes in the morning and at bedtime. 12/18/20   [provider]  ezetimibe (ZETIA) 10 MG tablet Take 10 mg by mouth daily. 06/09/21   [provider]  glucose blood (ONE TOUCH ULTRA TEST) test strip Use to check blood sugar 3 times daily Dx code E11.65 Patient taking differently: 1 each by Other route See admin instructions. Use to check blood sugar 3 times daily Dx code E11.65 11/20/14   Elayne Snare, MD  lisinopril (PRINIVIL,ZESTRIL) 40 MG tablet Take 1 tablet (40 mg total) by mouth daily. 10/10/18   Copland, Gay Filler, MD  NIFEdipine (ADALAT CC) 60 MG 24 hr tablet Take 1 tablet (60 mg total) by mouth daily. 07/09/21   Daleen Bo, MD  omeprazole (PRILOSEC) 40 MG capsule Take 40 mg by mouth 2 (two) times daily. 06/08/21   [provider]  ONETOUCH VERIO test strip [place] Patient taking differently: 1 each by Other route in the morning, at noon, and at bedtime. 03/30/18   Elayne Snare, MD  OZEMPIC, 0.25 OR 0.5 MG/DOSE, 2 MG/1.5ML SOPN Inject 0.25 mg into the skin once a week. Sundays 05/12/21   [provider]  sucralfate (CARAFATE) 1 g tablet Take 1 tablet (1 g  total) by mouth 4 (four) times daily -  with meals and at bedtime for 14 days. 08/28/21 09/11/21  Henderly, Britni A, PA-C      Allergies    Hydralazine hcl and Atorvastatin    Review of Systems   Review of Systems  Constitutional:  Negative for chills and fever.  Respiratory:  Negative for shortness of breath.   Cardiovascular:  Negative for chest pain.  Musculoskeletal:  Negative for arthralgias and joint swelling.  Skin:  Negative for color change, rash and wound.  Neurological:  Positive for numbness (right sided). Negative for dizziness, syncope, weakness and light-headedness.       -Tingling  All other systems reviewed and are negative.  Physical Exam Updated Vital Signs BP (!)  184/80    Pulse 77    Temp 98.1 F (36.7 C) (Oral)    Resp 16    SpO2 93%  Physical Exam Vitals and nursing note reviewed.  Constitutional:      General: She is not in acute distress.    Appearance: She is not diaphoretic.  HENT:     Head: Normocephalic and atraumatic.     Mouth/Throat:     Pharynx: No oropharyngeal exudate.  Eyes:     General: No scleral icterus.    Conjunctiva/sclera: Conjunctivae normal.  Cardiovascular:     Rate and Rhythm: Normal rate and regular rhythm.     Pulses: Normal pulses.     Heart sounds: Normal heart sounds.  Pulmonary:     Effort: Pulmonary effort is normal. No respiratory distress.     Breath sounds: Normal breath sounds. No wheezing.  Chest:     Chest wall: No tenderness.  Abdominal:     General: Bowel sounds are normal.     Palpations: Abdomen is soft. There is no mass.     Tenderness: There is no abdominal tenderness. There is no guarding or rebound.  Musculoskeletal:        General: Normal range of motion.     Cervical back: Normal range of motion and neck supple.     Comments: Radial, DP, PT pulses intact bilaterally.  Able to ambulate without assistance or difficulty.  Strength intact to bilateral upper and lower extremities.  Skin:    General: Skin is warm and dry.  Neurological:     Mental Status: She is alert.     Sensory: Sensory deficit present.     Motor: No pronator drift.     Coordination: Coordination is intact. Coordination normal. Finger-Nose-Finger Test and Heel to [facility] Test normal.     Gait: Gait is intact. Gait and tandem walk normal.     Comments: Decreased sensation noted to right upper and lower extremity.  Psychiatric:        Behavior: Behavior normal.    ED Results / Procedures / Treatments   Labs (all labs ordered are listed, but only abnormal results are displayed) Labs Reviewed  CBG MONITORING, ED - Abnormal; Notable for the following components:      Result Value   Glucose-Capillary 157 (*)    All other  components within normal limits  RESP PANEL BY RT-PCR (FLU A&B, COVID) ARPGX2    EKG EKG Interpretation  Date/Time:  Friday August 29 2021 09:38:29 EST Ventricular Rate:  78 PR Interval:  191 QRS Duration: 168 QT Interval:  459 QTC Calculation: 523 R Axis:   -24 Text Interpretation: Sinus rhythm Right bundle branch block Baseline wander in lead(s) V6 since last tracing no  significant change Confirmed by Malvin Johns [phone]) on 08/29/2021 11:46:08 AM  Radiology DG Chest 2 View  Result Date: 08/28/2021 CLINICAL DATA:  Palpitations, chest tightness, RIGHT-sided numbness today, history CHF, diabetes mellitus, hypertension, GERD EXAM: CHEST - 2 VIEW COMPARISON:  08/25/2021 FINDINGS: Normal heart size, mediastinal contours, and pulmonary vascularity. Atherosclerotic calcification aorta. Lungs clear. No infiltrate, pleural effusion, or pneumothorax. No acute osseous findings. IMPRESSION: No acute abnormalities. Aortic Atherosclerosis (ICD10-I70.0). Electronically Signed   By: Lavonia Dana M.D.   On: 08/28/2021 09:49   CT Head Wo Contrast  Result Date: 08/29/2021 CLINICAL DATA:  Right-sided paresthesias EXAM: CT HEAD WITHOUT CONTRAST TECHNIQUE: Contiguous axial images were obtained from the base of the skull through the vertex without intravenous contrast. COMPARISON:  08/01/2021 FINDINGS: Brain: No evidence of acute infarction, hemorrhage, hydrocephalus, extra-axial collection or mass lesion/mass effect. Vascular: No hyperdense vessel or unexpected calcification. Skull: Normal. Negative for fracture or focal lesion. Sinuses/Orbits: No acute finding. Other: None. IMPRESSION: Normal head CT without contrast. Electronically Signed   By: Jerilynn Mages.  Shick M.D.   On: 08/29/2021 10:50    Procedures Procedures   Medications Ordered in ED Medications - No data to display  ED Course/ Medical Decision Making/ A&P Clinical Course as of 08/29/21 1352  Fri Aug 29, 2021  1035 Case discussed with attending who  recommends ct head wo contrast. No labs due to evaluation and labs yesterday. Consult with teleneuro [SB]  1320 Consult with neurologist, Dr. Leonel Ramsay who recommends MRI wo contrast [SB]  51 Spoke with Dr. Billy Fischer, who agrees to accept the patient in an ED to ED transfer. [SB]  1335 Patient reevaluated and noted of ED to ED transfer and MRI brain without contrast.  Patient agreeable to transfer at this time.  Patient appears safe for transfer. [SB]    Clinical Course User Index [SB] Jazzmin Newbold A, PA-C                           Medical Decision Making  Amount and/or complexity of data reviewed: Independent historian: Patient Labs: No labs ordered today, most recent labs from yesterday 08/28/2021.  Overall unremarkable. EKG/medicine test: No acute ST/T changes. Imaging: CT head without contrast without acute intracranial abnormality.  Pt is a 71 year old female, who presents to the ED with numbness to her right side since 5:30 AM this morning.  Patient went to bed at 10 PM last night.  She lives alone.  She has a history of CAD, hypertension, diabetes.  Denies history of CVA or TIA.  Vital signs stable, patient afebrile, not hypoxic. On exam, patient with decreased sensation noted to right upper and lower extremity. Grip strength equal bilaterally.  Strength to the bilateral upper extremities symmetric and 5/5.  No facial droop. Radial pulses intact bilaterally. No acute cardiovascular, respiratory, or abdominal exam findings. Differential diagnosis includes CVA, TIA, electrolyte abnormality.   Patient was evaluated in the ED and had labs completed yesterday.  Yesterday's labs (04/01/2118), salicylate level unremarkable, urinalysis unremarkable, CBC stable, BMP stable from priors. CT head without contrast without no intracranial abnormality.   Consult with neurologist, Dr. Leonel Ramsay who recommends ED to ED transfer for MRI brain without contrast.  Discussed case with attending who agrees  with ED to ED transfer for MRI work-up.  Discussed with attending, Dr. Billy Fischer at [facility], ED who accepts the patient in transfer.  Discussed with patient at bedside who is agreeable to ED to ED transfer at  this time.  Patient appears safe for transfer.  Labs personally reviewed and interpreted: CBC with hemoglobin at 9.7, stable from previous labs. Urinalysis with 5 ketones and 100 protein. Salicylate undetectable  Imaging personally reviewed and interpreted: CT head without contrast without any acute intracranial abnormality.  I agree with the radiologist interpretation.  Problem List / ED Course: Clinical Course as of 08/29/21 1352  Fri Aug 29, 2021  1035 Case discussed with attending who recommends ct head wo contrast. No labs due to evaluation and labs yesterday. Consult with teleneuro [SB]  1320 Consult with neurologist, Dr. Leonel Ramsay who recommends MRI wo contrast [SB]  21 Spoke with Dr. Billy Fischer, who agrees to accept the patient in an ED to ED transfer. [SB]  1335 Patient reevaluated and noted of ED to ED transfer and MRI brain without contrast.  Patient agreeable to transfer at this time.  Patient appears safe for transfer. [SB]    Clinical Course User Index [SB] Tyonna Talerico A, PA-C    Dispostion: After consideration of the diagnostic results and the patients response to treatment, I feel that the patent would benefit from ED to ED transfer for MRI work-up.  This chart was dictated using voice recognition software, Dragon. Despite the best efforts of this provider to proofread and correct errors, errors may still occur which can change documentation meaning.  Final Clinical Impression(s) / ED Diagnoses Final diagnoses:  Numbness    Rx / DC Orders ED Discharge Orders     None         Seamus Warehime A, PA-C 08/29/21 1354    Malvin Johns, MD 08/29/21 1431

## 2021-08-29 NOTE — ED Notes (Signed)
REPORT GIVEN TO Karen Chafe

## 2021-08-29 NOTE — ED Triage Notes (Addendum)
Pt arrives with reports of waking up this morning and feeling numb to her right side stating 'its all the way down to my toes from my head it feels like I got a numbing shot at the dentist. Pt moving legs and arms freely and speech is WNL. Pt went to bed around 10 last night, and woke up with these symptoms. LSN 10 PM.   Patient later states that she was seen at the hospital yesterday for a swooshing in her heart and put on an antibiotic reports taking on dose of that this morning. Also states a week before she was seen in [place] for UTI and finished her antibiotic for that.

## 2021-08-29 NOTE — Telephone Encounter (Signed)
Pt c/o of Chest Pain: STAT if CP now or developed within 24 hours  1. Are you having CP right now? Burning in her chest, this burning is radiating through her back, feels like she is on fire-, feel like a heavy weight on her chest--was seen in the ER yesterday  experiencing any other symptoms (ex. SOB, nausea, vomiting, sweating)? Hear her heart pounding in her ears, hands are cool, sweating, headache, right side numb  3. How long have you been experiencing CP? Months, but got really bad  this week  4. Is your CP continuous or coming and going? Comes and goes  5. Have you taken Nitroglycerin? Yes, but do have any  at this time- paient ?

## 2021-08-29 NOTE — ED Notes (Signed)
Paged Dr. Mike Craze

## 2021-08-29 NOTE — ED Provider Notes (Signed)
Care assumed from previous provider. See their note for full HPI. Patient sent to ED for MRI due to right-sided paresthesias that started early this morning.   Procedures  Procedures  ED Course / MDM   Clinical Course as of 08/29/21 2047  Fri Aug 29, 2021  1035 Case discussed with attending who recommends ct head wo contrast. No labs due to evaluation and labs yesterday. Consult with teleneuro [SB]  1320 Consult with neurologist, Dr. Leonel Ramsay who recommends MRI wo contrast [SB]  74 Spoke with Dr. Billy Fischer, who agrees to accept the patient in an ED to ED transfer. [SB]  1335 Patient reevaluated and noted of ED to ED transfer and MRI brain without contrast.  Patient agreeable to transfer at this time.  Patient appears safe for transfer. [SB]    Clinical Course User Index [SB] Blue, Soijett A, PA-C   Medical Decision Making  71 year old female transferred from [place] for MRI due to right-sided paresthesias that started earlier this morning.  Neurology was consulted who recommended MRI to rule out CVA.  MRI personally reviewed which is negative for any acute abnormalities.  Ambulatory neurology referral placed.  Advised patient follow-up with PCP within 1 week for further evaluation. Strict ED precautions discussed with patient. Patient states understanding and agrees to plan. Patient discharged home in no acute distress and stable vitals        Karie Kirks 08/29/21 2104    Pattricia Boss, MD 08/30/21 1753

## 2021-08-29 NOTE — ED Triage Notes (Signed)
EMS stated, Pt is here for MRI,  pt had some numbness . Sent here for her Dr.

## 2021-08-29 NOTE — ED Notes (Signed)
Re-paged Dr. Mike Craze

## 2021-09-06 ENCOUNTER — Other Ambulatory Visit: Payer: Self-pay

## 2021-09-06 ENCOUNTER — Encounter (HOSPITAL_BASED_OUTPATIENT_CLINIC_OR_DEPARTMENT_OTHER): Payer: Self-pay | Admitting: Emergency Medicine

## 2021-09-06 ENCOUNTER — Emergency Department (HOSPITAL_BASED_OUTPATIENT_CLINIC_OR_DEPARTMENT_OTHER)
Admission: EM | Admit: 2021-09-06 | Discharge: 2021-09-06 | Disposition: A | Discharge status: Home / Self Care | Payer: Medicare PPO | Attending: Emergency Medicine | Admitting: Emergency Medicine

## 2021-09-06 DIAGNOSIS — Z20822 Contact with and (suspected) exposure to covid-19: Secondary | ICD-10-CM | POA: Diagnosis present

## 2021-09-06 DIAGNOSIS — Z79899 Other long term (current) drug therapy: Secondary | ICD-10-CM | POA: Diagnosis not present

## 2021-09-06 LAB — RESP PANEL BY RT-PCR (FLU A&B, COVID) ARPGX2
Influenza A by PCR: NEGATIVE
Influenza B by PCR: NEGATIVE
SARS Coronavirus 2 by RT PCR: NEGATIVE

## 2021-09-06 NOTE — Discharge Instructions (Signed)
COVID test was negative.  Return if you develop chest pain, shortness of breath, cough, high fevers

## 2021-09-06 NOTE — ED Provider Notes (Signed)
Teresa Poole Note   CSN: [id number] Arrival date & time: 09/06/21  1553    History  Chief Complaint  Patient presents with   Covid test    Teresa Poole is a 71 y.o. female here for evaluation of COVID exposure.  Teresa Poole tested positive today.  Teresa Poole was exposed to her yesterday.  Teresa Poole denies any fever, headache, nausea, vomiting, chest pain, shortness of breath, congestion, rhinorrhea, cough, myalgias, abdominal pain.  HPI     Home Medications Prior to Admission medications   Medication Sig Start Date End Date Taking? Authorizing Poole  carvedilol (COREG) 25 MG tablet Take 25 mg by mouth 2 (two) times daily with a meal.    Poole, Historical, MD  cephALEXin (KEFLEX) 500 MG capsule Take 500 mg by mouth 3 (three) times daily. 08/15/21   Poole, Historical, MD  cycloSPORINE, PF, (CEQUA) 0.09 % SOLN Place 1 drop into both eyes in the morning and at bedtime. 12/18/20   Poole, Historical, MD  ezetimibe (ZETIA) 10 MG tablet Take 10 mg by mouth daily. 06/09/21   Poole, Historical, MD  glucose blood (ONE TOUCH ULTRA TEST) test strip Use to check blood sugar 3 times daily Dx code E11.65 Patient taking differently: 1 each by Other route See admin instructions. Use to check blood sugar 3 times daily Dx code E11.65 11/20/14   Elayne Snare, MD  lisinopril (PRINIVIL,ZESTRIL) 40 MG tablet Take 1 tablet (40 mg total) by mouth daily. 10/10/18   Copland, Gay Filler, MD  NIFEdipine (ADALAT CC) 60 MG 24 hr tablet Take 1 tablet (60 mg total) by mouth daily. 07/09/21   Daleen Bo, MD  omeprazole (PRILOSEC) 40 MG capsule Take 40 mg by mouth 2 (two) times daily. 06/08/21   Poole, Historical, MD  ONETOUCH VERIO test strip [place] Patient taking differently: 1 each by Other route in the morning, at noon, and at bedtime. 03/30/18   Elayne Snare, MD  OZEMPIC, 0.25 OR 0.5 MG/DOSE, 2 MG/1.5ML SOPN Inject 0.25 mg into the skin once a  week. Sundays Teresa/19/22   Poole, Historical, MD  sucralfate (CARAFATE) 1 g tablet Take 1 tablet (1 g total) by mouth 4 (four) times daily -  with meals and at bedtime for 14 days. 08/28/21 09/11/21  Emmanuelle Hibbitts A, PA-C      Allergies    Hydralazine hcl and Atorvastatin    Review of Systems   Review of Systems  Constitutional: Negative.   HENT: Negative.    Respiratory: Negative.    Cardiovascular: Negative.   Gastrointestinal: Negative.   Genitourinary: Negative.   Musculoskeletal: Negative.   Skin: Negative.   Neurological: Negative.   All other systems reviewed and are negative.  Physical Exam Updated Vital Signs BP (!) 157/84    Pulse 82    Temp 99.3 F (37.4 C) (Oral)    Resp 18    Ht 5\' 7"  (1.702 m)    SpO2 100%    BMI 24.28 kg/m  Physical Exam Vitals and nursing note reviewed.  Constitutional:      General: Teresa Poole is not in acute distress.    Appearance: Teresa Poole is well-developed. Teresa Poole is not ill-appearing, toxic-appearing or diaphoretic.  HENT:     Head: Normocephalic and atraumatic.     Nose: Nose normal. No congestion or rhinorrhea.     Mouth/Throat:     Mouth: Mucous membranes are moist.  Eyes:     Pupils: Pupils are equal, round,  and reactive to light.  Cardiovascular:     Rate and Rhythm: Normal rate.     Pulses: Normal pulses.     Heart sounds: Normal heart sounds.  Pulmonary:     Effort: Pulmonary effort is normal. No respiratory distress.     Breath sounds: Normal breath sounds.  Abdominal:     General: Bowel sounds are normal. There is no distension.     Palpations: Abdomen is soft.  Musculoskeletal:        General: Normal range of motion.     Cervical back: Normal range of motion.  Skin:    General: Skin is warm and dry.     Capillary Refill: Capillary refill takes less than 2 seconds.  Neurological:     General: No focal deficit present.     Mental Status: Teresa Poole is alert and oriented to person, place, and time.  Psychiatric:        Mood and Affect:  Mood normal.    ED Results / Procedures / Treatments   Labs (all labs ordered are listed, but only abnormal results are displayed) Labs Reviewed  RESP PANEL BY RT-PCR (FLU A&B, COVID) ARPGX2    EKG None  Radiology No results found.  Procedures Procedures    Medications Ordered in ED Medications - No data to display  ED Course/ Medical Decision Making/ A&P    Pleasant Teresa Poole here for evaluation of COVID exposure.  Teresa Poole is currently asymptomatic.  Teresa Poole is afebrile, nonseptic, not ill-appearing.  Heart and lungs clear.  Abdomen soft, nontender.  Nonfocal neuro exam without deficits.  Tolerating p.o. intake.  Personally reviewed patient's labs.  COVID, flu negative.  I discussed results personally with patient.  We will have her keep a close eye in case Teresa Poole develops any symptoms.  Teresa Poole will return for any worsening symptoms, follow-up with PCP  The patient has been appropriately medically screened and/or stabilized in the ED. I have low suspicion for any other emergent medical condition which would require further screening, evaluation or treatment in the ED or require inpatient management.  Patient is hemodynamically stable and in no acute distress.  Patient able to ambulate in department prior to ED.  Evaluation does not show acute pathology that would require ongoing or additional emergent interventions while in the emergency department or further inpatient treatment.  I have discussed the diagnosis with the patient and answered all questions.  Pain is been managed while in the emergency department and patient has no further complaints prior to discharge.  Patient is comfortable with plan discussed in room and is stable for discharge at this time.  I have discussed strict return precautions for returning to the emergency department.  Patient was encouraged to follow-up with PCP/specialist refer to at discharge.                            Medical Decision Making Amount and/or  Complexity of Data Reviewed External Data Reviewed: labs, radiology, ECG and notes. Labs: ordered. Decision-making details documented in ED Course.  Risk OTC drugs.           Final Clinical Impression(s) / ED Diagnoses Final diagnoses:  Close exposure to COVID-19 virus    Rx / DC Orders ED Discharge Orders     None         Montserrat Shek A, PA-C 09/06/21 1914    Hayden Rasmussen, MD 09/07/21 1015

## 2021-09-06 NOTE — ED Triage Notes (Signed)
Pt was exposed to Covid and wants to be tested; denies sxs

## 2021-09-09 ENCOUNTER — Emergency Department (HOSPITAL_BASED_OUTPATIENT_CLINIC_OR_DEPARTMENT_OTHER)
Admission: EM | Admit: 2021-09-09 | Discharge: 2021-09-09 | Disposition: A | Discharge status: Home / Self Care | Payer: Medicare PPO | Attending: Emergency Medicine | Admitting: Emergency Medicine

## 2021-09-09 ENCOUNTER — Emergency Department (HOSPITAL_BASED_OUTPATIENT_CLINIC_OR_DEPARTMENT_OTHER): Payer: Medicare PPO

## 2021-09-09 ENCOUNTER — Other Ambulatory Visit: Payer: Self-pay

## 2021-09-09 ENCOUNTER — Encounter (HOSPITAL_BASED_OUTPATIENT_CLINIC_OR_DEPARTMENT_OTHER): Payer: Self-pay

## 2021-09-09 DIAGNOSIS — Z79899 Other long term (current) drug therapy: Secondary | ICD-10-CM | POA: Insufficient documentation

## 2021-09-09 DIAGNOSIS — R079 Chest pain, unspecified: Secondary | ICD-10-CM

## 2021-09-09 DIAGNOSIS — R0789 Other chest pain: Secondary | ICD-10-CM | POA: Diagnosis present

## 2021-09-09 DIAGNOSIS — I700 Atherosclerosis of aorta: Secondary | ICD-10-CM | POA: Insufficient documentation

## 2021-09-09 LAB — BASIC METABOLIC PANEL
Anion gap: 9 (ref 5–15)
BUN: 40 mg/dL — ABNORMAL HIGH (ref 8–23)
CO2: 25 mmol/L (ref 22–32)
Calcium: 9.2 mg/dL (ref 8.9–10.3)
Chloride: 104 mmol/L (ref 98–111)
Creatinine, Ser: 2.96 mg/dL — ABNORMAL HIGH (ref 0.44–1.00)
GFR, Estimated: 17 mL/min — ABNORMAL LOW (ref >60)
Glucose, Bld: 145 mg/dL — ABNORMAL HIGH (ref 70–99)
Potassium: 4.6 mmol/L (ref 3.5–5.1)
Sodium: 138 mmol/L (ref 135–145)

## 2021-09-09 LAB — CBC
HCT: 30.3 % — ABNORMAL LOW (ref 36.0–46.0)
Hemoglobin: 10.2 g/dL — ABNORMAL LOW (ref 12.0–15.0)
MCH: 30 pg (ref 26.0–34.0)
MCHC: 33.7 g/dL (ref 30.0–36.0)
MCV: 89.1 fL (ref 80.0–100.0)
Platelets: 311 10*3/uL (ref 150–400)
RBC: 3.4 MIL/uL — ABNORMAL LOW (ref 3.87–5.11)
RDW: 13.4 % (ref 11.5–15.5)
WBC: 5.4 10*3/uL (ref 4.0–10.5)
nRBC: 0 % (ref 0.0–0.2)

## 2021-09-09 LAB — TROPONIN I (HIGH SENSITIVITY)
Troponin I (High Sensitivity): 12 ng/L (ref <18)
Troponin I (High Sensitivity): 9 ng/L (ref <18)

## 2021-09-09 MED ORDER — LIDOCAINE VISCOUS HCL 2 % MT SOLN
15.0000 mL | Freq: Once | OROMUCOSAL | Status: AC
  Administered 2021-09-09: 15 mL via ORAL
  Filled 2021-09-09: qty 15

## 2021-09-09 MED ORDER — ALUM & MAG HYDROXIDE-SIMETH 200-200-20 MG/5ML PO SUSP
30.0000 mL | Freq: Once | ORAL | Status: AC
  Administered 2021-09-09: 30 mL via ORAL
  Filled 2021-09-09: qty 30

## 2021-09-09 NOTE — ED Triage Notes (Signed)
Pt c/o central chest pressure that started this morning. Pt also states that pain radiates into her R arm and shoulder.

## 2021-09-09 NOTE — Discharge Instructions (Signed)
Overall suspect her symptoms are secondary to acid reflux.  Follow-up with GI doctor today.

## 2021-09-09 NOTE — ED Provider Notes (Signed)
[place] EMERGENCY DEPARTMENT Provider Note   CSN: [id number] Arrival date & time: 09/09/21  5053     History  Chief Complaint  Patient presents with   Chest Pain    Teresa Poole is a 71 y.o. female.  The history is provided by the patient.  Chest Pain Pain location:  Substernal area Pain quality: burning   Pain radiates to:  Does not radiate Pain severity:  Mild Onset quality:  Gradual Timing:  Intermittent Progression:  Waxing and waning Chronicity:  New Context: eating   Relieved by:  Nothing Worsened by:  Nothing Associated symptoms: heartburn   Associated symptoms: no abdominal pain, no anorexia, no anxiety, no back pain, no claudication, no cough, no diaphoresis, no dizziness, no dysphagia, no fatigue, no fever, no headache, no lower extremity edema, no nausea, no near-syncope, no numbness, no orthopnea, no palpitations, no PND, no shortness of breath, no syncope, no vomiting and no weakness   Risk factors: coronary artery disease, high cholesterol and hypertension       Home Medications Prior to Admission medications   Medication Sig Start Date End Date Taking? Authorizing Provider  carvedilol (COREG) 25 MG tablet Take 25 mg by mouth 2 (two) times daily with a meal.    [provider]  cephALEXin (KEFLEX) 500 MG capsule Take 500 mg by mouth 3 (three) times daily. 08/15/21   [provider]  cycloSPORINE, PF, (CEQUA) 0.09 % SOLN Place 1 drop into both eyes in the morning and at bedtime. 12/18/20   [provider]  ezetimibe (ZETIA) 10 MG tablet Take 10 mg by mouth daily. 06/09/21   [provider]  glucose blood (ONE TOUCH ULTRA TEST) test strip Use to check blood sugar 3 times daily Dx code E11.65 Patient taking differently: 1 each by Other route See admin instructions. Use to check blood sugar 3 times daily Dx code E11.65 11/20/14   Elayne Snare, MD  lisinopril (PRINIVIL,ZESTRIL) 40 MG tablet Take 1 tablet (40 mg  total) by mouth daily. 10/10/18   Copland, Gay Filler, MD  NIFEdipine (ADALAT CC) 60 MG 24 hr tablet Take 1 tablet (60 mg total) by mouth daily. 07/09/21   Daleen Bo, MD  omeprazole (PRILOSEC) 40 MG capsule Take 40 mg by mouth 2 (two) times daily. 06/08/21   [provider]  ONETOUCH VERIO test strip [place] Patient taking differently: 1 each by Other route in the morning, at noon, and at bedtime. 03/30/18   Elayne Snare, MD  OZEMPIC, 0.25 OR 0.5 MG/DOSE, 2 MG/1.5ML SOPN Inject 0.25 mg into the skin once a week. Sundays 05/12/21   [provider]  sucralfate (CARAFATE) 1 g tablet Take 1 tablet (1 g total) by mouth 4 (four) times daily -  with meals and at bedtime for 14 days. 08/28/21 09/11/21  Henderly, Britni A, PA-C      Allergies    Hydralazine hcl and Atorvastatin    Review of Systems   Review of Systems  Constitutional:  Negative for diaphoresis, fatigue and fever.  HENT:  Negative for trouble swallowing.   Respiratory:  Negative for cough and shortness of breath.   Cardiovascular:  Positive for chest pain. Negative for palpitations, orthopnea, claudication, syncope, PND and near-syncope.  Gastrointestinal:  Positive for heartburn. Negative for abdominal pain, anorexia, nausea and vomiting.  Musculoskeletal:  Negative for back pain.  Neurological:  Negative for dizziness, weakness, numbness and headaches.   Physical Exam Updated Vital  Signs BP (!) 151/71    Pulse 76    Temp 98.1 F (36.7 C) (Oral)    Resp 17    Ht 5\' 7"  (1.702 m)    Wt 70.3 kg    SpO2 94%    BMI 24.28 kg/m  Physical Exam Vitals and nursing note reviewed.  Constitutional:      General: She is not in acute distress.    Appearance: She is well-developed. She is not ill-appearing.  HENT:     Head: Normocephalic and atraumatic.  Eyes:     Extraocular Movements: Extraocular movements intact.     Conjunctiva/sclera: Conjunctivae normal.     Pupils: Pupils are equal, round,  and reactive to light.  Cardiovascular:     Rate and Rhythm: Normal rate and regular rhythm.     Pulses:          Radial pulses are 2+ on the right side and 2+ on the left side.     Heart sounds: Normal heart sounds. No murmur heard. Pulmonary:     Effort: Pulmonary effort is normal. No respiratory distress.     Breath sounds: Normal breath sounds. No decreased breath sounds, wheezing or rhonchi.  Abdominal:     Palpations: Abdomen is soft.     Tenderness: There is no abdominal tenderness.  Musculoskeletal:        General: No swelling. Normal range of motion.     Cervical back: Normal range of motion and neck supple.     Right lower leg: No edema.     Left lower leg: No edema.  Skin:    General: Skin is warm and dry.     Capillary Refill: Capillary refill takes less than 2 seconds.  Neurological:     General: No focal deficit present.     Mental Status: She is alert.  Psychiatric:        Mood and Affect: Mood normal.    ED Results / Procedures / Treatments   Labs (all labs ordered are listed, but only abnormal results are displayed) Labs Reviewed  BASIC METABOLIC PANEL - Abnormal; Notable for the following components:      Result Value   Glucose, Bld 145 (*)    BUN 40 (*)    Creatinine, Ser 2.96 (*)    GFR, Estimated 17 (*)    All other components within normal limits  CBC - Abnormal; Notable for the following components:   RBC 3.40 (*)    Hemoglobin 10.2 (*)    HCT 30.3 (*)    All other components within normal limits  TROPONIN I (HIGH SENSITIVITY)  TROPONIN I (HIGH SENSITIVITY)    EKG EKG Interpretation  Date/Time:  Tuesday September 09 2021 09:38:01 EST Ventricular Rate:  71 PR Interval:  184 QRS Duration: 142 QT Interval:  442 QTC Calculation: 480 R Axis:   -10 Text Interpretation: Normal sinus rhythm Right bundle branch block When compared with ECG of 29-Aug-2021 09:38, PREVIOUS ECG IS PRESENT Confirmed by Lennice Sites ([id number]) on 09/09/2021 9:45:27  AM  Radiology DG Chest 2 View  Result Date: 09/09/2021 CLINICAL DATA:  71 year old female with central chest pressure, pain radiating to the right shoulder and arm. EXAM: CHEST - 2 VIEW COMPARISON:  Chest radiographs 08/28/2021 and earlier. FINDINGS: Lung volumes and mediastinal contours remain normal. Calcified aortic atherosclerosis. Visualized tracheal air column is within normal limits. Calcified right subclavian/axillary atherosclerosis. Both lungs appear stable and clear. No pneumothorax or pleural effusion. No acute osseous abnormality identified.  Negative visible bowel gas. IMPRESSION: No acute cardiopulmonary abnormality. Aortic Atherosclerosis (ICD10-I70.0). Electronically Signed   By: Genevie Ann M.D.   On: 09/09/2021 10:04    Procedures Procedures    Medications Ordered in ED Medications  alum & mag hydroxide-simeth (MAALOX/MYLANTA) 200-200-20 MG/5ML suspension 30 mL (30 mLs Oral Given 09/09/21 1110)    And  lidocaine (XYLOCAINE) 2 % viscous mouth solution 15 mL (15 mLs Oral Given 09/09/21 1110)    ED Course/ Medical Decision Making/ A&P                           Medical Decision Making Amount and/or Complexity of Data Reviewed Labs: ordered. Radiology: ordered.  Risk OTC drugs. Prescription drug management.   Teresa Poole is here with burning chest pain.  Symptoms on and off for a while.  Worse this morning.  Denies any shortness of breath, abdominal pain, nausea, vomiting.  Saw her cardiologist several days ago who has referred her to GI.  Has had several cardiac work-ups here recently.  Not having anginal type symptoms.  She overall appears well.  Feels like she has a acid taste in her mouth and feels like she is having acid in her abdomen.  She has no abdominal tenderness on exam.  Clear breath sounds.  No signs of respiratory distress.  EKG reviewed by myself shows sinus rhythm.  No ischemic changes.  Unchanged from prior EKGs.  Differential diagnosis includes likely  acid reflux versus less likely acute coronary syndrome.  No concern for pulmonary embolism, dissection.  Will get chest x-ray, basic labs including troponin.  Will give GI cocktail and reevaluate.  Symptoms have completely resolved with GI cocktail.  Patient feels much better.  Repeat troponin stable.  No concern for ACS.  Overall suspect reflux.  She has follow-up appointment with GI this afternoon as scheduled prior to this visit today.  We will have them manage reflux symptoms.  Discharged in good condition.  This chart was dictated using voice recognition software.  Despite best efforts to proofread,  errors can occur which can change the documentation meaning.         Final Clinical Impression(s) / ED Diagnoses Final diagnoses:  Nonspecific chest pain    Rx / DC Orders ED Discharge Orders     None         Lennice Sites, DO 09/09/21 1155

## 2021-09-15 ENCOUNTER — Encounter (HOSPITAL_BASED_OUTPATIENT_CLINIC_OR_DEPARTMENT_OTHER): Payer: Self-pay | Admitting: *Deleted

## 2021-09-15 ENCOUNTER — Other Ambulatory Visit: Payer: Self-pay

## 2021-09-15 ENCOUNTER — Emergency Department (HOSPITAL_BASED_OUTPATIENT_CLINIC_OR_DEPARTMENT_OTHER)
Admission: EM | Admit: 2021-09-15 | Discharge: 2021-09-15 | Disposition: A | Discharge status: Home / Self Care | Payer: Medicare PPO | Attending: Emergency Medicine | Admitting: Emergency Medicine

## 2021-09-15 DIAGNOSIS — Z8543 Personal history of malignant neoplasm of ovary: Secondary | ICD-10-CM | POA: Insufficient documentation

## 2021-09-15 DIAGNOSIS — Z79899 Other long term (current) drug therapy: Secondary | ICD-10-CM | POA: Diagnosis not present

## 2021-09-15 DIAGNOSIS — K219 Gastro-esophageal reflux disease without esophagitis: Secondary | ICD-10-CM | POA: Diagnosis not present

## 2021-09-15 DIAGNOSIS — N184 Chronic kidney disease, stage 4 (severe): Secondary | ICD-10-CM | POA: Insufficient documentation

## 2021-09-15 DIAGNOSIS — I2510 Atherosclerotic heart disease of native coronary artery without angina pectoris: Secondary | ICD-10-CM | POA: Insufficient documentation

## 2021-09-15 DIAGNOSIS — E1122 Type 2 diabetes mellitus with diabetic chronic kidney disease: Secondary | ICD-10-CM | POA: Diagnosis not present

## 2021-09-15 DIAGNOSIS — I509 Heart failure, unspecified: Secondary | ICD-10-CM | POA: Insufficient documentation

## 2021-09-15 DIAGNOSIS — I130 Hypertensive heart and chronic kidney disease with heart failure and stage 1 through stage 4 chronic kidney disease, or unspecified chronic kidney disease: Secondary | ICD-10-CM | POA: Diagnosis not present

## 2021-09-15 DIAGNOSIS — M549 Dorsalgia, unspecified: Secondary | ICD-10-CM | POA: Diagnosis present

## 2021-09-15 LAB — CBC WITH DIFFERENTIAL/PLATELET
Abs Immature Granulocytes: 0.01 10*3/uL (ref 0.00–0.07)
Basophils Absolute: 0 10*3/uL (ref 0.0–0.1)
Basophils Relative: 0 %
Eosinophils Absolute: 0.1 10*3/uL (ref 0.0–0.5)
Eosinophils Relative: 3 %
HCT: 27.9 % — ABNORMAL LOW (ref 36.0–46.0)
Hemoglobin: 9.2 g/dL — ABNORMAL LOW (ref 12.0–15.0)
Immature Granulocytes: 0 %
Lymphocytes Relative: 35 %
Lymphs Abs: 1.6 10*3/uL (ref 0.7–4.0)
MCH: 29.2 pg (ref 26.0–34.0)
MCHC: 33 g/dL (ref 30.0–36.0)
MCV: 88.6 fL (ref 80.0–100.0)
Monocytes Absolute: 0.4 10*3/uL (ref 0.1–1.0)
Monocytes Relative: 8 %
Neutro Abs: 2.4 10*3/uL (ref 1.7–7.7)
Neutrophils Relative %: 54 %
Platelets: 271 10*3/uL (ref 150–400)
RBC: 3.15 MIL/uL — ABNORMAL LOW (ref 3.87–5.11)
RDW: 13.5 % (ref 11.5–15.5)
WBC: 4.6 10*3/uL (ref 4.0–10.5)
nRBC: 0 % (ref 0.0–0.2)

## 2021-09-15 LAB — COMPREHENSIVE METABOLIC PANEL
ALT: 13 U/L (ref 0–44)
AST: 19 U/L (ref 15–41)
Albumin: 3.3 g/dL — ABNORMAL LOW (ref 3.5–5.0)
Alkaline Phosphatase: 40 U/L (ref 38–126)
Anion gap: 7 (ref 5–15)
BUN: 35 mg/dL — ABNORMAL HIGH (ref 8–23)
CO2: 26 mmol/L (ref 22–32)
Calcium: 9.2 mg/dL (ref 8.9–10.3)
Chloride: 107 mmol/L (ref 98–111)
Creatinine, Ser: 2.28 mg/dL — ABNORMAL HIGH (ref 0.44–1.00)
GFR, Estimated: 23 mL/min — ABNORMAL LOW (ref >60)
Glucose, Bld: 153 mg/dL — ABNORMAL HIGH (ref 70–99)
Potassium: 4.4 mmol/L (ref 3.5–5.1)
Sodium: 140 mmol/L (ref 135–145)
Total Bilirubin: 0.3 mg/dL (ref 0.3–1.2)
Total Protein: 6.6 g/dL (ref 6.5–8.1)

## 2021-09-15 LAB — URINALYSIS, ROUTINE W REFLEX MICROSCOPIC
Bilirubin Urine: NEGATIVE
Glucose, UA: NEGATIVE mg/dL
Hgb urine dipstick: NEGATIVE
Ketones, ur: NEGATIVE mg/dL
Nitrite: NEGATIVE
Protein, ur: 30 mg/dL — AB
Specific Gravity, Urine: 1.02 (ref 1.005–1.030)
pH: 7 (ref 5.0–8.0)

## 2021-09-15 LAB — URINALYSIS, MICROSCOPIC (REFLEX)

## 2021-09-15 LAB — LIPASE, BLOOD: Lipase: 40 U/L (ref 11–51)

## 2021-09-15 MED ORDER — FAMOTIDINE IN NACL 20-0.9 MG/50ML-% IV SOLN
20.0000 mg | Freq: Once | INTRAVENOUS | Status: AC
  Administered 2021-09-15: 20 mg via INTRAVENOUS
  Filled 2021-09-15: qty 50

## 2021-09-15 MED ORDER — SODIUM CHLORIDE 0.9 % IV BOLUS
1000.0000 mL | Freq: Once | INTRAVENOUS | Status: AC
  Administered 2021-09-15: 1000 mL via INTRAVENOUS

## 2021-09-15 MED ORDER — ALUM & MAG HYDROXIDE-SIMETH 200-200-20 MG/5ML PO SUSP
30.0000 mL | Freq: Once | ORAL | Status: AC
  Administered 2021-09-15: 30 mL via ORAL
  Filled 2021-09-15: qty 30

## 2021-09-15 NOTE — Discharge Instructions (Addendum)
Take the omeprazole 1/2 hr to 1 hour before eating.  You can take it twice a day if needed.  Schedule your endoscopy with the GI doctors.

## 2021-09-15 NOTE — ED Triage Notes (Addendum)
Constant burning, itching in bladder and GI tract,  loss of bladder control ,  onset 0430 this am

## 2021-09-15 NOTE — ED Provider Notes (Signed)
[place] EMERGENCY DEPARTMENT Provider Note   CSN: [id number] Arrival date & time: 09/15/21  0740     History  Chief Complaint  Patient presents with   Back Pain    Teresa Poole is a 71 y.o. female.  Pt is a 71 yo bf with a hx of htn, dm2, ovarian cancer, chf, cad, carotid stenosis, hyperlipidemia, ckd, nonischemic cm, ckd IV, arthritis, anxiety, copd, and gerd.  Pt presents to the ED today with burning all over her body.  She feels like sulfuric acid is getting thrown all over her.  She has burning when she urinates and had 1 episode of urinary incontinence this am.  Pt was in the ED on 1/17 for CP.  She was treated with a GI cocktail and felt better.  She followed up with her GI doctor on 1/19.  She has not been taking her ppi as prescribed and she did not schedule her endoscopy at her last visit.  GI is rescheduling her endoscopy and they told her to take her meds as directed.       Home Medications Prior to Admission medications   Medication Sig Start Date End Date Taking? Authorizing Provider  carvedilol (COREG) 25 MG tablet Take 25 mg by mouth 2 (two) times daily with a meal.    [provider]  cephALEXin (KEFLEX) 500 MG capsule Take 500 mg by mouth 3 (three) times daily. 08/15/21   [provider]  cycloSPORINE, PF, (CEQUA) 0.09 % SOLN Place 1 drop into both eyes in the morning and at bedtime. 12/18/20   [provider]  ezetimibe (ZETIA) 10 MG tablet Take 10 mg by mouth daily. 06/09/21   [provider]  glucose blood (ONE TOUCH ULTRA TEST) test strip Use to check blood sugar 3 times daily Dx code E11.65 Patient taking differently: 1 each by Other route See admin instructions. Use to check blood sugar 3 times daily Dx code E11.65 11/20/14   Elayne Snare, MD  lisinopril (PRINIVIL,ZESTRIL) 40 MG tablet Take 1 tablet (40 mg total) by mouth daily. 10/10/18   Copland, Gay Filler, MD  NIFEdipine (ADALAT CC) 60 MG 24 hr tablet Take  1 tablet (60 mg total) by mouth daily. 07/09/21   Daleen Bo, MD  omeprazole (PRILOSEC) 40 MG capsule Take 40 mg by mouth 2 (two) times daily. 06/08/21   [provider]  ONETOUCH VERIO test strip [place] Patient taking differently: 1 each by Other route in the morning, at noon, and at bedtime. 03/30/18   Elayne Snare, MD  OZEMPIC, 0.25 OR 0.5 MG/DOSE, 2 MG/1.5ML SOPN Inject 0.25 mg into the skin once a week. Sundays 05/12/21   [provider]  sucralfate (CARAFATE) 1 g tablet Take 1 tablet (1 g total) by mouth 4 (four) times daily -  with meals and at bedtime for 14 days. 08/28/21 09/11/21  Henderly, Britni A, PA-C      Allergies    Hydralazine hcl and Atorvastatin    Review of Systems   Review of Systems  Cardiovascular:  Positive for chest pain.  Gastrointestinal:  Positive for abdominal pain.  Genitourinary:  Positive for dysuria.  All other systems reviewed and are negative.  Physical Exam Updated Vital Signs BP 124/61    Pulse 70    Temp 98.4 F (36.9 C) (Oral)    Resp 16    Ht 5\' 7"  (1.702 m)    Wt 70.3 kg  SpO2 98%    BMI 24.28 kg/m  Physical Exam Vitals and nursing note reviewed.  Constitutional:      Appearance: Normal appearance.  HENT:     Head: Normocephalic and atraumatic.     Right Ear: External ear normal.     Left Ear: External ear normal.     Nose: Nose normal.     Mouth/Throat:     Mouth: Mucous membranes are moist.     Pharynx: Oropharynx is clear.  Eyes:     Extraocular Movements: Extraocular movements intact.     Conjunctiva/sclera: Conjunctivae normal.     Pupils: Pupils are equal, round, and reactive to light.  Cardiovascular:     Rate and Rhythm: Normal rate and regular rhythm.     Pulses: Normal pulses.     Heart sounds: Normal heart sounds.  Pulmonary:     Effort: Pulmonary effort is normal.     Breath sounds: Normal breath sounds.  Abdominal:     General: Abdomen is flat. Bowel sounds are normal.      Palpations: Abdomen is soft.  Musculoskeletal:        General: Normal range of motion.     Cervical back: Normal range of motion and neck supple.  Skin:    General: Skin is warm.     Capillary Refill: Capillary refill takes less than 2 seconds.  Neurological:     General: No focal deficit present.     Mental Status: She is alert and oriented to person, place, and time.  Psychiatric:        Mood and Affect: Mood normal.        Behavior: Behavior normal.    ED Results / Procedures / Treatments   Labs (all labs ordered are listed, but only abnormal results are displayed) Labs Reviewed  URINALYSIS, ROUTINE W REFLEX MICROSCOPIC - Abnormal; Notable for the following components:      Result Value   Protein, ur 30 (*)    Leukocytes,Ua SMALL (*)    All other components within normal limits  CBC WITH DIFFERENTIAL/PLATELET - Abnormal; Notable for the following components:   RBC 3.15 (*)    Hemoglobin 9.2 (*)    HCT 27.9 (*)    All other components within normal limits  COMPREHENSIVE METABOLIC PANEL - Abnormal; Notable for the following components:   Glucose, Bld 153 (*)    BUN 35 (*)    Creatinine, Ser 2.28 (*)    Albumin 3.3 (*)    GFR, Estimated 23 (*)    All other components within normal limits  URINALYSIS, MICROSCOPIC (REFLEX) - Abnormal; Notable for the following components:   Bacteria, UA RARE (*)    All other components within normal limits  LIPASE, BLOOD    EKG None  Radiology No results found.  Procedures Procedures    Medications Ordered in ED Medications  sodium chloride 0.9 % bolus 1,000 mL (1,000 mLs Intravenous New Bag/Given 09/15/21 0856)  famotidine (PEPCID) IVPB 20 mg premix (0 mg Intravenous Stopped 09/15/21 0938)  alum & mag hydroxide-simeth (MAALOX/MYLANTA) 200-200-20 MG/5ML suspension 30 mL (30 mLs Oral Given 09/15/21 0900)    ED Course/ Medical Decision Making/ A&P                           Medical Decision Making Amount and/or Complexity  of Data Reviewed Labs: ordered.  Risk OTC drugs. Prescription drug management.   Due to the abd pain and burning,  labs were obtained.  She has evidence of ckd and chronic anemia from ckd.  She now has stage IV ckd and said she's not been referred to a nephrologist.  She is given the number to [facility].  Pt's burning pain is likely due to GERD.  I reviewed how to take the omeprazole and encouraged her to take it twice a day if needed.  She is encouraged to schedule the EGD. The burning pain is better after a GI cocktail.  The ua is neg for uti.  She is spilling protein likely due to CKD.  Dysuria is resolved after GI cocktail.  I will send for a culture.  Pt is stable for d/c.  She is instructed to return if worse.  F/u with pcp.        Final Clinical Impression(s) / ED Diagnoses Final diagnoses:  Gastroesophageal reflux disease, unspecified whether esophagitis present  Stage 4 chronic kidney disease ([place])    Rx / DC Orders ED Discharge Orders     None         Isla Pence, MD 09/15/21 1004

## 2021-09-15 NOTE — ED Notes (Signed)
Called lab at 1015 to add urine culture.

## 2021-09-16 LAB — URINE CULTURE

## 2021-09-22 ENCOUNTER — Inpatient Hospital Stay (HOSPITAL_BASED_OUTPATIENT_CLINIC_OR_DEPARTMENT_OTHER)
Admission: EM | Admit: 2021-09-22 | Discharge: 2021-09-27 | DRG: 388 | Disposition: A | Discharge status: Home / Self Care | Payer: Medicare PPO | Attending: Internal Medicine | Admitting: Internal Medicine

## 2021-09-22 ENCOUNTER — Emergency Department (HOSPITAL_BASED_OUTPATIENT_CLINIC_OR_DEPARTMENT_OTHER): Payer: Medicare PPO

## 2021-09-22 ENCOUNTER — Encounter (HOSPITAL_BASED_OUTPATIENT_CLINIC_OR_DEPARTMENT_OTHER): Payer: Self-pay

## 2021-09-22 ENCOUNTER — Telehealth (HOSPITAL_BASED_OUTPATIENT_CLINIC_OR_DEPARTMENT_OTHER): Payer: Self-pay | Admitting: Family

## 2021-09-22 ENCOUNTER — Other Ambulatory Visit: Payer: Self-pay

## 2021-09-22 DIAGNOSIS — E782 Mixed hyperlipidemia: Secondary | ICD-10-CM | POA: Diagnosis present

## 2021-09-22 DIAGNOSIS — D631 Anemia in chronic kidney disease: Secondary | ICD-10-CM | POA: Diagnosis present

## 2021-09-22 DIAGNOSIS — F05 Delirium due to known physiological condition: Secondary | ICD-10-CM | POA: Diagnosis not present

## 2021-09-22 DIAGNOSIS — F4322 Adjustment disorder with anxiety: Secondary | ICD-10-CM | POA: Diagnosis present

## 2021-09-22 DIAGNOSIS — I10 Essential (primary) hypertension: Secondary | ICD-10-CM | POA: Diagnosis present

## 2021-09-22 DIAGNOSIS — Z20822 Contact with and (suspected) exposure to covid-19: Secondary | ICD-10-CM | POA: Diagnosis present

## 2021-09-22 DIAGNOSIS — Z8249 Family history of ischemic heart disease and other diseases of the circulatory system: Secondary | ICD-10-CM

## 2021-09-22 DIAGNOSIS — E1122 Type 2 diabetes mellitus with diabetic chronic kidney disease: Secondary | ICD-10-CM | POA: Diagnosis present

## 2021-09-22 DIAGNOSIS — Z0189 Encounter for other specified special examinations: Secondary | ICD-10-CM

## 2021-09-22 DIAGNOSIS — I2510 Atherosclerotic heart disease of native coronary artery without angina pectoris: Secondary | ICD-10-CM | POA: Diagnosis present

## 2021-09-22 DIAGNOSIS — I110 Hypertensive heart disease with heart failure: Secondary | ICD-10-CM | POA: Diagnosis present

## 2021-09-22 DIAGNOSIS — I152 Hypertension secondary to endocrine disorders: Secondary | ICD-10-CM | POA: Diagnosis present

## 2021-09-22 DIAGNOSIS — I5042 Chronic combined systolic (congestive) and diastolic (congestive) heart failure: Secondary | ICD-10-CM | POA: Diagnosis present

## 2021-09-22 DIAGNOSIS — N184 Chronic kidney disease, stage 4 (severe): Secondary | ICD-10-CM | POA: Diagnosis present

## 2021-09-22 DIAGNOSIS — I130 Hypertensive heart and chronic kidney disease with heart failure and stage 1 through stage 4 chronic kidney disease, or unspecified chronic kidney disease: Secondary | ICD-10-CM | POA: Diagnosis present

## 2021-09-22 DIAGNOSIS — Z8543 Personal history of malignant neoplasm of ovary: Secondary | ICD-10-CM

## 2021-09-22 DIAGNOSIS — K56609 Unspecified intestinal obstruction, unspecified as to partial versus complete obstruction: Principal | ICD-10-CM | POA: Diagnosis present

## 2021-09-22 DIAGNOSIS — I428 Other cardiomyopathies: Secondary | ICD-10-CM | POA: Diagnosis present

## 2021-09-22 DIAGNOSIS — Z79899 Other long term (current) drug therapy: Secondary | ICD-10-CM

## 2021-09-22 DIAGNOSIS — R112 Nausea with vomiting, unspecified: Secondary | ICD-10-CM | POA: Diagnosis not present

## 2021-09-22 DIAGNOSIS — Z888 Allergy status to other drugs, medicaments and biological substances status: Secondary | ICD-10-CM

## 2021-09-22 DIAGNOSIS — Z833 Family history of diabetes mellitus: Secondary | ICD-10-CM

## 2021-09-22 DIAGNOSIS — E1169 Type 2 diabetes mellitus with other specified complication: Secondary | ICD-10-CM | POA: Diagnosis present

## 2021-09-22 DIAGNOSIS — I5022 Chronic systolic (congestive) heart failure: Secondary | ICD-10-CM | POA: Diagnosis present

## 2021-09-22 DIAGNOSIS — I6529 Occlusion and stenosis of unspecified carotid artery: Secondary | ICD-10-CM | POA: Diagnosis present

## 2021-09-22 DIAGNOSIS — I160 Hypertensive urgency: Secondary | ICD-10-CM | POA: Diagnosis present

## 2021-09-22 DIAGNOSIS — E669 Obesity, unspecified: Secondary | ICD-10-CM | POA: Diagnosis present

## 2021-09-22 DIAGNOSIS — K5651 Intestinal adhesions [bands], with partial obstruction: Principal | ICD-10-CM | POA: Diagnosis present

## 2021-09-22 DIAGNOSIS — K219 Gastro-esophageal reflux disease without esophagitis: Secondary | ICD-10-CM | POA: Diagnosis present

## 2021-09-22 DIAGNOSIS — G9341 Metabolic encephalopathy: Secondary | ICD-10-CM | POA: Clinically undetermined

## 2021-09-22 DIAGNOSIS — Z7985 Long-term (current) use of injectable non-insulin antidiabetic drugs: Secondary | ICD-10-CM

## 2021-09-22 DIAGNOSIS — Z90710 Acquired absence of both cervix and uterus: Secondary | ICD-10-CM

## 2021-09-22 LAB — CBC WITH DIFFERENTIAL/PLATELET
Abs Immature Granulocytes: 0.01 10*3/uL (ref 0.00–0.07)
Basophils Absolute: 0 10*3/uL (ref 0.0–0.1)
Basophils Relative: 0 %
Eosinophils Absolute: 0.1 10*3/uL (ref 0.0–0.5)
Eosinophils Relative: 2 %
HCT: 31.5 % — ABNORMAL LOW (ref 36.0–46.0)
Hemoglobin: 10.3 g/dL — ABNORMAL LOW (ref 12.0–15.0)
Immature Granulocytes: 0 %
Lymphocytes Relative: 31 %
Lymphs Abs: 1.7 10*3/uL (ref 0.7–4.0)
MCH: 29.5 pg (ref 26.0–34.0)
MCHC: 32.7 g/dL (ref 30.0–36.0)
MCV: 90.3 fL (ref 80.0–100.0)
Monocytes Absolute: 0.5 10*3/uL (ref 0.1–1.0)
Monocytes Relative: 9 %
Neutro Abs: 3.2 10*3/uL (ref 1.7–7.7)
Neutrophils Relative %: 58 %
Platelets: 283 10*3/uL (ref 150–400)
RBC: 3.49 MIL/uL — ABNORMAL LOW (ref 3.87–5.11)
RDW: 13.5 % (ref 11.5–15.5)
WBC: 5.5 10*3/uL (ref 4.0–10.5)
nRBC: 0 % (ref 0.0–0.2)

## 2021-09-22 LAB — RESP PANEL BY RT-PCR (FLU A&B, COVID) ARPGX2
Influenza A by PCR: NEGATIVE
Influenza B by PCR: NEGATIVE
SARS Coronavirus 2 by RT PCR: NEGATIVE

## 2021-09-22 LAB — URINALYSIS, MICROSCOPIC (REFLEX)

## 2021-09-22 LAB — COMPREHENSIVE METABOLIC PANEL
ALT: 12 U/L (ref 0–44)
AST: 16 U/L (ref 15–41)
Albumin: 3.4 g/dL — ABNORMAL LOW (ref 3.5–5.0)
Alkaline Phosphatase: 41 U/L (ref 38–126)
Anion gap: 11 (ref 5–15)
BUN: 42 mg/dL — ABNORMAL HIGH (ref 8–23)
CO2: 25 mmol/L (ref 22–32)
Calcium: 8.9 mg/dL (ref 8.9–10.3)
Chloride: 101 mmol/L (ref 98–111)
Creatinine, Ser: 2.7 mg/dL — ABNORMAL HIGH (ref 0.44–1.00)
GFR, Estimated: 19 mL/min — ABNORMAL LOW (ref >60)
Glucose, Bld: 139 mg/dL — ABNORMAL HIGH (ref 70–99)
Potassium: 4.5 mmol/L (ref 3.5–5.1)
Sodium: 137 mmol/L (ref 135–145)
Total Bilirubin: 0.6 mg/dL (ref 0.3–1.2)
Total Protein: 6.6 g/dL (ref 6.5–8.1)

## 2021-09-22 LAB — URINALYSIS, ROUTINE W REFLEX MICROSCOPIC
Bilirubin Urine: NEGATIVE
Glucose, UA: NEGATIVE mg/dL
Hgb urine dipstick: NEGATIVE
Ketones, ur: NEGATIVE mg/dL
Nitrite: NEGATIVE
Protein, ur: 100 mg/dL — AB
Specific Gravity, Urine: 1.02 (ref 1.005–1.030)
pH: 5.5 (ref 5.0–8.0)

## 2021-09-22 LAB — LIPASE, BLOOD: Lipase: 39 U/L (ref 11–51)

## 2021-09-22 LAB — TROPONIN I (HIGH SENSITIVITY)
Troponin I (High Sensitivity): 17 ng/L (ref <18)
Troponin I (High Sensitivity): 15 ng/L (ref <18)

## 2021-09-22 MED ORDER — LORAZEPAM 2 MG/ML IJ SOLN
1.0000 mg | Freq: Once | INTRAMUSCULAR | Status: AC | PRN
  Administered 2021-09-22: 1 mg via INTRAVENOUS
  Filled 2021-09-22: qty 1

## 2021-09-22 MED ORDER — LIDOCAINE VISCOUS HCL 2 % MT SOLN
15.0000 mL | Freq: Once | OROMUCOSAL | Status: AC
  Administered 2021-09-22: 15 mL via ORAL
  Filled 2021-09-22: qty 15

## 2021-09-22 MED ORDER — SODIUM CHLORIDE 0.9 % IV BOLUS
500.0000 mL | Freq: Once | INTRAVENOUS | Status: AC
  Administered 2021-09-22: 500 mL via INTRAVENOUS

## 2021-09-22 MED ORDER — ONDANSETRON HCL 4 MG/2ML IJ SOLN: 4.0000 mg | Freq: Once | INTRAMUSCULAR | Status: DC

## 2021-09-22 MED ORDER — ONDANSETRON HCL 4 MG/2ML IJ SOLN
4.0000 mg | Freq: Once | INTRAMUSCULAR | Status: AC
  Administered 2021-09-22: 4 mg via INTRAVENOUS
  Filled 2021-09-22: qty 2

## 2021-09-22 MED ORDER — LIDOCAINE HCL URETHRAL/MUCOSAL 2 % EX GEL
1.0000 "application " | Freq: Once | CUTANEOUS | Status: AC
  Administered 2021-09-22: 1 via TOPICAL
  Filled 2021-09-22: qty 11

## 2021-09-22 MED ORDER — CARVEDILOL 12.5 MG PO TABS
25.0000 mg | ORAL_TABLET | Freq: Once | ORAL | Status: AC
  Administered 2021-09-22: 25 mg via ORAL
  Filled 2021-09-22: qty 2

## 2021-09-22 MED ORDER — FAMOTIDINE IN NACL 20-0.9 MG/50ML-% IV SOLN
20.0000 mg | Freq: Once | INTRAVENOUS | Status: AC
  Administered 2021-09-22: 20 mg via INTRAVENOUS
  Filled 2021-09-22: qty 50

## 2021-09-22 MED ORDER — ALUM & MAG HYDROXIDE-SIMETH 200-200-20 MG/5ML PO SUSP
30.0000 mL | Freq: Once | ORAL | Status: AC
  Administered 2021-09-22: 30 mL via ORAL
  Filled 2021-09-22: qty 30

## 2021-09-22 NOTE — ED Notes (Addendum)
Pt reporting increased LUQ pain - "feels like gas, and like insides are going to fall out"

## 2021-09-22 NOTE — ED Notes (Signed)
Patient transported to CT 

## 2021-09-22 NOTE — ED Triage Notes (Signed)
Pt c/o mid/ left side abd pain, n/v started ~5am-NAD-steady gait

## 2021-09-22 NOTE — ED Notes (Signed)
Report given to [facility]. 

## 2021-09-22 NOTE — Telephone Encounter (Signed)
Per Laurann Montana, NP:  Earna Coder on Wednesday has seen [facility] cardiology (who she is established with) since ED discharge. Lovena Le, will you please call her to see if she wants to continue to follow with [facility] cardiology or with [place]? She just only needs one cardiologist.   ---Attempted to reach pt, no answer. Left message to call back.

## 2021-09-22 NOTE — ED Provider Notes (Addendum)
6:53 PM I discussed with Dr. Gwenlyn Found and we have reviewed ECGs. No stemi at this time. Would cycle enzymes to be cautious, however. Patient is quite well appearing, with abd pain that goes into chest and is burning. Of note, most recent ECG is after nurse changed leads into a more appropriate placement    Sherwood Gambler, MD 09/22/21 1903  CT scan images have been reviewed by myself and show a small bowel obstruction with a large stomach.  Discussed with Dr. Marlou Starks, and given the stomach size he recommends NG tube.  General surgery will follow.  Because of her comorbidities including CHF, CKD, etc., hospitalist will be consulted for admission. D/w Dr. Tonie Griffith.   Sherwood Gambler, MD 09/22/21 [phone]

## 2021-09-22 NOTE — ED Provider Notes (Signed)
[facility] EMERGENCY DEPARTMENT Provider Note   CSN: [id number] Arrival date & time: 09/22/21  1705     History  Chief Complaint  Patient presents with   Abdominal Pain    Teresa Poole is a 71 y.o. female with a past medical history of diabetes, hypertension, stage IV chronic kidney disease and congestive heart failure presenting today with a complaint of abdominal pain, nausea and vomiting.  She reports that at 3 AM she woke up and vomited.  Says that she has not vomited in 45 years.  Did not change her diet at all.  She was able to eat a full meal around noon today however she continues to have uncomfortable pain in her epigastrium.  Says that she feels as though something is "trying to push up or down on her GI tract."  Denies any shortness of breath or back pain.  No fevers or chills.  No diarrhea.    Home Medications Prior to Admission medications   Medication Sig Start Date End Date Taking? Authorizing Provider  carvedilol (COREG) 25 MG tablet Take 25 mg by mouth 2 (two) times daily with a meal.    [provider]  cephALEXin (KEFLEX) 500 MG capsule Take 500 mg by mouth 3 (three) times daily. 08/15/21   [provider]  cycloSPORINE, PF, (CEQUA) 0.09 % SOLN Place 1 drop into both eyes in the morning and at bedtime. 12/18/20   [provider]  ezetimibe (ZETIA) 10 MG tablet Take 10 mg by mouth daily. 06/09/21   [provider]  glucose blood (ONE TOUCH ULTRA TEST) test strip Use to check blood sugar 3 times daily Dx code E11.65 Patient taking differently: 1 each by Other route See admin instructions. Use to check blood sugar 3 times daily Dx code E11.65 11/20/14   Elayne Snare, MD  lisinopril (PRINIVIL,ZESTRIL) 40 MG tablet Take 1 tablet (40 mg total) by mouth daily. 10/10/18   Copland, Gay Filler, MD  NIFEdipine (ADALAT CC) 60 MG 24 hr tablet Take 1 tablet (60 mg total) by mouth daily. 07/09/21   Daleen Bo, MD  omeprazole  (PRILOSEC) 40 MG capsule Take 40 mg by mouth 2 (two) times daily. 06/08/21   [provider]  ONETOUCH VERIO test strip [place] Patient taking differently: 1 each by Other route in the morning, at noon, and at bedtime. 03/30/18   Elayne Snare, MD  OZEMPIC, 0.25 OR 0.5 MG/DOSE, 2 MG/1.5ML SOPN Inject 0.25 mg into the skin once a week. Sundays 05/12/21   [provider]  sucralfate (CARAFATE) 1 g tablet Take 1 tablet (1 g total) by mouth 4 (four) times daily -  with meals and at bedtime for 14 days. 08/28/21 09/11/21  Henderly, Britni A, PA-C      Allergies    Hydralazine hcl and Atorvastatin    Review of Systems   Review of Systems  Gastrointestinal:  Positive for abdominal pain.  See HPI Physical Exam Updated Vital Signs BP (!) 185/86    Pulse 78    Temp 98.6 F (37 C) (Oral)    Resp 18    Ht 5\' 7"  (1.702 m)    Wt 70.3 kg    SpO2 100%    BMI 24.28 kg/m  Physical Exam Vitals and nursing note reviewed.  Constitutional:      General: She is not in acute distress.    Appearance: Normal appearance. She is not ill-appearing.  HENT:  Head: Normocephalic and atraumatic.  Eyes:     General: No scleral icterus.    Conjunctiva/sclera: Conjunctivae normal.  Cardiovascular:     Rate and Rhythm: Normal rate and regular rhythm.  Pulmonary:     Effort: Pulmonary effort is normal. No respiratory distress.  Abdominal:     General: Abdomen is flat.     Palpations: Abdomen is soft.     Tenderness: There is abdominal tenderness in the epigastric area.     Hernia: No hernia is present.  Skin:    General: Skin is warm and dry.     Findings: No rash.  Neurological:     Mental Status: She is alert.  Psychiatric:        Mood and Affect: Mood normal.    ED Results / Procedures / Treatments   Labs (all labs ordered are listed, but only abnormal results are displayed) Labs Reviewed  URINALYSIS, ROUTINE W REFLEX MICROSCOPIC - Abnormal; Notable for the  following components:      Result Value   APPearance HAZY (*)    Protein, ur 100 (*)    Leukocytes,Ua SMALL (*)    All other components within normal limits  CBC WITH DIFFERENTIAL/PLATELET - Abnormal; Notable for the following components:   RBC 3.49 (*)    Hemoglobin 10.3 (*)    HCT 31.5 (*)    All other components within normal limits  URINALYSIS, MICROSCOPIC (REFLEX) - Abnormal; Notable for the following components:   Bacteria, UA FEW (*)    All other components within normal limits  COMPREHENSIVE METABOLIC PANEL  LIPASE, BLOOD  TROPONIN I (HIGH SENSITIVITY)    EKG EKG Interpretation  Date/Time:  Monday September 22 2021 18:50:07 EST Ventricular Rate:  76 PR Interval:  181 QRS Duration: 159 QT Interval:  435 QTC Calculation: 490 R Axis:   -8 Text Interpretation: Sinus rhythm Right bundle branch block nonspecific inferior changes improved from earlier, correlating with lead change Confirmed by Sherwood Gambler [phone]) on 09/22/2021 6:56:31 PM  Radiology No results found.  Procedures Procedures   In normal sinus rhythm throughout my  Medications Ordered in ED Medications  carvedilol (COREG) tablet 25 mg (has no administration in time range)  ondansetron (ZOFRAN) injection 4 mg (has no administration in time range)  famotidine (PEPCID) IVPB 20 mg premix (has no administration in time range)  sodium chloride 0.9 % bolus 500 mL (0 mLs Intravenous Stopped 09/22/21 1912)  alum & mag hydroxide-simeth (MAALOX/MYLANTA) 200-200-20 MG/5ML suspension 30 mL (30 mLs Oral Given 09/22/21 1831)    And  lidocaine (XYLOCAINE) 2 % viscous mouth solution 15 mL (15 mLs Oral Given 09/22/21 1831)    ED Course/ Medical Decision Making/ A&P                           Medical Decision Making Amount and/or Complexity of Data Reviewed Labs: ordered.  Risk OTC drugs. Prescription drug management.   71 year old female presenting today with abdominal pain.  Reports that at 3 AM this morning  she threw up and continued to have a nagging pain throughout the day.  No more episodes of emesis.  The differential diagnosis for generalized abdominal pain includes, but is not limited to AAA, gastroenteritis, appendicitis, Bowel obstruction, Bowel perforation. Gastroparesis, DKA, Hernia, Inflammatory bowel disease, mesenteric ischemia, pancreatitis, peritonitis SBP, volvulus.  All were considered throughout the evaluation of the patient   Testing: Patient had normal abdominal pain labs ordered.  I interpreted  these and saw no acute abnormalities. -I ordered an EKG due to patient's vague epigastric pain.  This originally called for a STEMI.  Dr. Regenia Skeeter spoke with cardiology who said they do not believe it is a STEMI.  Troponin ordered at this time.  Imaging: Due to patient's chronic kidney disease, CT abdomen with contrast is contraindicated.  Low suspicion for acute abdominal process and decided not to pursue a CT Noncon.    Treatment: Patient treated with GI cocktail and she says that this somewhat helped.  Given Zofran and Pepcid at this time.  Also given her carvedilol for her elevated blood pressure.  Has not taken her nightly medications.   Final Clinical Impression(s) / ED Diagnoses  Rx / DC Orders Patient signed out to Dr. Regenia Skeeter.  Please see his note for troponin results and ultimate disposition.  I suggest if patient has negative troponin x2 she will be discharged home.     Rhae Hammock, PA-C 09/22/21 1943    Sherwood Gambler, MD 09/23/21 [phone]

## 2021-09-23 ENCOUNTER — Inpatient Hospital Stay (HOSPITAL_COMMUNITY): Payer: Medicare PPO

## 2021-09-23 DIAGNOSIS — K56609 Unspecified intestinal obstruction, unspecified as to partial versus complete obstruction: Secondary | ICD-10-CM | POA: Diagnosis not present

## 2021-09-23 DIAGNOSIS — Z7985 Long-term (current) use of injectable non-insulin antidiabetic drugs: Secondary | ICD-10-CM | POA: Diagnosis not present

## 2021-09-23 DIAGNOSIS — I428 Other cardiomyopathies: Secondary | ICD-10-CM | POA: Diagnosis present

## 2021-09-23 DIAGNOSIS — R112 Nausea with vomiting, unspecified: Secondary | ICD-10-CM | POA: Diagnosis present

## 2021-09-23 DIAGNOSIS — F4322 Adjustment disorder with anxiety: Secondary | ICD-10-CM | POA: Diagnosis present

## 2021-09-23 DIAGNOSIS — E782 Mixed hyperlipidemia: Secondary | ICD-10-CM | POA: Diagnosis present

## 2021-09-23 DIAGNOSIS — K5651 Intestinal adhesions [bands], with partial obstruction: Secondary | ICD-10-CM | POA: Diagnosis present

## 2021-09-23 DIAGNOSIS — N184 Chronic kidney disease, stage 4 (severe): Secondary | ICD-10-CM | POA: Diagnosis present

## 2021-09-23 DIAGNOSIS — Z888 Allergy status to other drugs, medicaments and biological substances status: Secondary | ICD-10-CM | POA: Diagnosis not present

## 2021-09-23 DIAGNOSIS — I6529 Occlusion and stenosis of unspecified carotid artery: Secondary | ICD-10-CM | POA: Diagnosis present

## 2021-09-23 DIAGNOSIS — Z79899 Other long term (current) drug therapy: Secondary | ICD-10-CM | POA: Diagnosis not present

## 2021-09-23 DIAGNOSIS — I5042 Chronic combined systolic (congestive) and diastolic (congestive) heart failure: Secondary | ICD-10-CM | POA: Diagnosis present

## 2021-09-23 DIAGNOSIS — Z833 Family history of diabetes mellitus: Secondary | ICD-10-CM | POA: Diagnosis not present

## 2021-09-23 DIAGNOSIS — E1122 Type 2 diabetes mellitus with diabetic chronic kidney disease: Secondary | ICD-10-CM | POA: Diagnosis present

## 2021-09-23 DIAGNOSIS — I2510 Atherosclerotic heart disease of native coronary artery without angina pectoris: Secondary | ICD-10-CM | POA: Diagnosis present

## 2021-09-23 DIAGNOSIS — I160 Hypertensive urgency: Secondary | ICD-10-CM | POA: Diagnosis present

## 2021-09-23 DIAGNOSIS — I130 Hypertensive heart and chronic kidney disease with heart failure and stage 1 through stage 4 chronic kidney disease, or unspecified chronic kidney disease: Secondary | ICD-10-CM | POA: Diagnosis present

## 2021-09-23 DIAGNOSIS — Z8249 Family history of ischemic heart disease and other diseases of the circulatory system: Secondary | ICD-10-CM | POA: Diagnosis not present

## 2021-09-23 DIAGNOSIS — Z90710 Acquired absence of both cervix and uterus: Secondary | ICD-10-CM | POA: Diagnosis not present

## 2021-09-23 DIAGNOSIS — K219 Gastro-esophageal reflux disease without esophagitis: Secondary | ICD-10-CM | POA: Diagnosis present

## 2021-09-23 DIAGNOSIS — Z8543 Personal history of malignant neoplasm of ovary: Secondary | ICD-10-CM | POA: Diagnosis not present

## 2021-09-23 DIAGNOSIS — G9341 Metabolic encephalopathy: Secondary | ICD-10-CM | POA: Diagnosis not present

## 2021-09-23 DIAGNOSIS — F05 Delirium due to known physiological condition: Secondary | ICD-10-CM | POA: Diagnosis not present

## 2021-09-23 DIAGNOSIS — I110 Hypertensive heart disease with heart failure: Secondary | ICD-10-CM | POA: Diagnosis not present

## 2021-09-23 DIAGNOSIS — Z20822 Contact with and (suspected) exposure to covid-19: Secondary | ICD-10-CM | POA: Diagnosis present

## 2021-09-23 LAB — COMPREHENSIVE METABOLIC PANEL
ALT: 11 U/L (ref 0–44)
AST: 18 U/L (ref 15–41)
Albumin: 3.2 g/dL — ABNORMAL LOW (ref 3.5–5.0)
Alkaline Phosphatase: 38 U/L (ref 38–126)
Anion gap: 10 (ref 5–15)
BUN: 39 mg/dL — ABNORMAL HIGH (ref 8–23)
CO2: 25 mmol/L (ref 22–32)
Calcium: 8.6 mg/dL — ABNORMAL LOW (ref 8.9–10.3)
Chloride: 105 mmol/L (ref 98–111)
Creatinine, Ser: 2.84 mg/dL — ABNORMAL HIGH (ref 0.44–1.00)
GFR, Estimated: 17 mL/min — ABNORMAL LOW (ref >60)
Glucose, Bld: 117 mg/dL — ABNORMAL HIGH (ref 70–99)
Potassium: 4.8 mmol/L (ref 3.5–5.1)
Sodium: 140 mmol/L (ref 135–145)
Total Bilirubin: 0.7 mg/dL (ref 0.3–1.2)
Total Protein: 6.6 g/dL (ref 6.5–8.1)

## 2021-09-23 LAB — CBC
HCT: 31.9 % — ABNORMAL LOW (ref 36.0–46.0)
Hemoglobin: 10 g/dL — ABNORMAL LOW (ref 12.0–15.0)
MCH: 29.5 pg (ref 26.0–34.0)
MCHC: 31.3 g/dL (ref 30.0–36.0)
MCV: 94.1 fL (ref 80.0–100.0)
Platelets: 244 10*3/uL (ref 150–400)
RBC: 3.39 MIL/uL — ABNORMAL LOW (ref 3.87–5.11)
RDW: 13.4 % (ref 11.5–15.5)
WBC: 4.9 10*3/uL (ref 4.0–10.5)
nRBC: 0 % (ref 0.0–0.2)

## 2021-09-23 LAB — GLUCOSE, CAPILLARY
Glucose-Capillary: 82 mg/dL (ref 70–99)
Glucose-Capillary: 86 mg/dL (ref 70–99)

## 2021-09-23 LAB — MAGNESIUM: Magnesium: 2.5 mg/dL — ABNORMAL HIGH (ref 1.7–2.4)

## 2021-09-23 MED ORDER — HYDROMORPHONE HCL 1 MG/ML IJ SOLN
0.5000 mg | INTRAMUSCULAR | Status: DC | PRN
  Administered 2021-09-26: 0.5 mg via INTRAVENOUS

## 2021-09-23 MED ORDER — INSULIN ASPART 100 UNIT/ML IJ SOLN
0.0000 [IU] | Freq: Three times a day (TID) | INTRAMUSCULAR | Status: DC
  Administered 2021-09-25 – 2021-09-26 (×4): 1 [IU] via SUBCUTANEOUS

## 2021-09-23 MED ORDER — ONDANSETRON HCL 4 MG/2ML IJ SOLN
4.0000 mg | Freq: Four times a day (QID) | INTRAMUSCULAR | Status: DC | PRN
  Administered 2021-09-24: 4 mg via INTRAVENOUS

## 2021-09-23 MED ORDER — METOPROLOL TARTRATE 5 MG/5ML IV SOLN
5.0000 mg | Freq: Four times a day (QID) | INTRAVENOUS | Status: DC
  Administered 2021-09-23 – 2021-09-26 (×12): 5 mg via INTRAVENOUS

## 2021-09-23 MED ORDER — SODIUM CHLORIDE 0.9 % IV SOLN: INTRAVENOUS | Status: DC

## 2021-09-23 MED ORDER — HEPARIN SODIUM (PORCINE) 5000 UNIT/ML IJ SOLN
5000.0000 [IU] | Freq: Three times a day (TID) | INTRAMUSCULAR | Status: DC
  Administered 2021-09-23 – 2021-09-26 (×9): 5000 [IU] via SUBCUTANEOUS

## 2021-09-23 MED ORDER — DIATRIZOATE MEGLUMINE & SODIUM 66-10 % PO SOLN
90.0000 mL | Freq: Once | ORAL | Status: AC
  Administered 2021-09-23: 90 mL via NASOGASTRIC
  Filled 2021-09-23: qty 90

## 2021-09-23 NOTE — Progress Notes (Signed)
°  Transition of Care [facility]) Screening Note   Patient Details  Name: Teresa Poole Date of Birth: Feb 07, 1951   Transition of [facility]) CM/SW Contact:    Lennart Pall, LCSW Phone Number: 09/23/2021, 3:23 PM    Transition of Care Department [facility]) has reviewed patient and no TOC needs have been identified at this time. We will continue to monitor patient advancement through interdisciplinary progression rounds. If new patient transition needs arise, please place a TOC consult.

## 2021-09-23 NOTE — Consult Note (Signed)
Consult Note  Teresa Poole 11-10-1950  [id number].    Requesting MD: Landis Gandy, MD Chief Complaint/Reason for Consult: SBO  HPI:  Patient is a 71 year old female who presented to [facility] yesterday with complaints of abdominal pain with nausea and vomiting. She reports that she has had a burning sensation in abdomen and all over body including extremities and tears for several months. Patient is a little tangential when providing history. She also reports she developed rash like lesions over her legs, arms and back that crusted over - reports they are better now. She saw her PCP who prescribed an antibiotic. She reports last BM was last night and did not note anything unusual about it, just that it was small. She vomited a large volume of reddish watery material night before last. She also reports that she has drifted out of consciousness on multiple occasions and can't account for large chunks of time. She lives alone and many of her neighbors have been asking her if she is alright which concerns her. PMH otherwise significant for NICM, CAD, Chronic combined systolic and diastolic CHF, CKD stage IV, Carotid stenosis, HLD, HTN, T2DM, GERD. Past abdominal surgery includes hysterectomy for hx of ovarian cancer. Allergies listed to hydralazine and atorvastatin. She is not on any blood thinners.   ROS: Review of Systems  Constitutional:  Negative for chills and fever.  Respiratory:  Negative for shortness of breath and wheezing.   Cardiovascular:  Negative for chest pain and palpitations.  Gastrointestinal:  Positive for abdominal pain, nausea and vomiting. Negative for blood in stool, constipation, diarrhea and melena.  Genitourinary:  Positive for dysuria.  Skin:  Positive for rash.  Neurological:  Positive for tingling and sensory change.  Psychiatric/Behavioral:  Positive for memory loss.   All other systems reviewed and are negative.  Family History  Problem Relation Age  of Onset   Diabetes Father    Hypertension Father    Hypertension Maternal Grandfather     Past Medical History:  Diagnosis Date   Allergy    Anemia    Carotid stenosis    Chronic combined systolic and diastolic CHF (congestive heart failure) (HCC)    CKD (chronic kidney disease), stage IV ([place]) 09/24/2013   Pt at [facility], Dr. Joelyn Oms   Diabetes mellitus type 2 in nonobese [facility])    Edema 09/14/2013   GERD (gastroesophageal reflux disease)    History of ovarian cancer    Hyperlipidemia    Hypertension    Mild CAD    non-obstructive by LHC (09/25/2013): Proximal and mid LAD serial 20%, proximal circumflex 30%, mid AV groove circumflex 30%, mid RCA mild plaque.   NICM (nonischemic cardiomyopathy) (HCC)    Obesity (BMI 30-39.9)    RBBB    Renal artery stenosis ([place]) 10/11/2018   Thyroid disease    Seen by specialist    Past Surgical History:  Procedure Laterality Date   LEFT HEART CATHETERIZATION WITH CORONARY ANGIOGRAM N/A 09/25/2013   Procedure: LEFT HEART CATHETERIZATION WITH CORONARY ANGIOGRAM;  Surgeon: Burnell Blanks, MD;  Location: [facility] CATH LAB;  Service: Cardiovascular;  Laterality: N/A;   RENAL ANGIOGRAPHY N/A 10/06/2018   Procedure: RENAL ANGIOGRAPHY;  Surgeon: Marty Heck, MD;  Location: [facility] CV LAB;  Service: Cardiovascular;  Laterality: N/A;    Social History:  reports that she has never smoked. She has never used smokeless tobacco. She reports that she does not drink alcohol and does not use  drugs.  Allergies:  Allergies  Allergen Reactions   Hydralazine Hcl Itching and Other (See Comments)    ENTIRE BODY = burning sensation, also in the eyes (they have become very sensitive to light)   Atorvastatin Other (See Comments)    Per MD    Medications Prior to Admission  Medication Sig Dispense Refill   carvedilol (COREG) 25 MG tablet Take 25 mg by mouth 2 (two) times daily with a meal.     cephALEXin (KEFLEX) 500 MG capsule Take 500 mg  by mouth 3 (three) times daily.     cycloSPORINE, PF, (CEQUA) 0.09 % SOLN Place 1 drop into both eyes in the morning and at bedtime.     ezetimibe (ZETIA) 10 MG tablet Take 10 mg by mouth daily.     glucose blood (ONE TOUCH ULTRA TEST) test strip Use to check blood sugar 3 times daily Dx code E11.65 (Patient taking differently: 1 each by Other route See admin instructions. Use to check blood sugar 3 times daily Dx code E11.65) 100 each 3   lisinopril (PRINIVIL,ZESTRIL) 40 MG tablet Take 1 tablet (40 mg total) by mouth daily. 30 tablet 0   NIFEdipine (ADALAT CC) 60 MG 24 hr tablet Take 1 tablet (60 mg total) by mouth daily. 1 tablet 0   omeprazole (PRILOSEC) 40 MG capsule Take 40 mg by mouth 2 (two) times daily.     ONETOUCH VERIO test strip CHECK BLOOD SUGAR THREE TIMES DAILY (Patient taking differently: 1 each by Other route in the morning, at noon, and at bedtime.) 200 each 0   OZEMPIC, 0.25 OR 0.5 MG/DOSE, 2 MG/1.5ML SOPN Inject 0.25 mg into the skin once a week. Sundays     sucralfate (CARAFATE) 1 g tablet Take 1 tablet (1 g total) by mouth 4 (four) times daily -  with meals and at bedtime for 14 days. 56 tablet 0    Blood pressure (!) 174/71, pulse 88, temperature 99.1 F (37.3 C), temperature source Oral, resp. rate 14, height 5\' 7"  (1.702 m), weight 70.3 kg, SpO2 99 %. Physical Exam:  General: pleasant, WD, WN female who is laying in bed in NAD HEENT: head is normocephalic, atraumatic.  Sclera are noninjected.  PERRL.  Ears and nose without any masses or lesions.  Mouth is pink and moist Heart: regular, rate, and rhythm.  Normal s1,s2. No obvious murmurs, gallops, or rubs noted.  Palpable radial and pedal pulses bilaterally Lungs: CTAB, no wheezes, rhonchi, or rales noted.  Respiratory effort nonlabored Abd: soft, NT, ND, +BS, no masses, hernias, or organomegaly MS: all 4 extremities are symmetrical with no cyanosis, clubbing, or edema. Skin: healing scabbed lesion to RLE, otherwise no  rash noted, warm and dry Neuro: Cranial nerves 2-12 grossly intact, speech clear, following commands Psych: A&Ox3 with an odd affect    Results for orders placed or performed during the hospital encounter of 09/22/21 (from the past 48 hour(s))  Urinalysis, Routine w reflex microscopic Urine, Clean Catch     Status: Abnormal   Collection Time: 09/22/21  5:27 PM  Result Value Ref Range   Color, Urine YELLOW YELLOW   APPearance HAZY (A) CLEAR   Specific Gravity, Urine 1.020 1.005 - 1.030   pH 5.5 5.0 - 8.0   Glucose, UA NEGATIVE NEGATIVE mg/dL   Hgb urine dipstick NEGATIVE NEGATIVE   Bilirubin Urine NEGATIVE NEGATIVE   Ketones, ur NEGATIVE NEGATIVE mg/dL   Protein, ur 100 (A) NEGATIVE mg/dL   Nitrite NEGATIVE NEGATIVE  Leukocytes,Ua SMALL (A) NEGATIVE    Comment: Performed at [facility], [address] [place], [place] [address]  Urinalysis, Microscopic (reflex)     Status: Abnormal   Collection Time: 09/22/21  5:27 PM  Result Value Ref Range   RBC / HPF 0-5 0 - 5 RBC/hpf   WBC, UA 11-20 0 - 5 WBC/hpf   Bacteria, UA FEW (A) NONE SEEN   Squamous Epithelial / LPF 0-5 0 - 5    Comment: Performed at [facility], [address] [place], [place] [address]  Comprehensive metabolic panel     Status: Abnormal   Collection Time: 09/22/21  5:57 PM  Result Value Ref Range   Sodium 137 135 - 145 mmol/L   Potassium 4.5 3.5 - 5.1 mmol/L   Chloride 101 98 - 111 mmol/L   CO2 25 22 - 32 mmol/L   Glucose, Bld 139 (H) 70 - 99 mg/dL    Comment: Glucose reference range applies only to samples taken after fasting for at least 8 hours.   BUN 42 (H) 8 - 23 mg/dL   Creatinine, Ser 2.70 (H) 0.44 - 1.00 mg/dL   Calcium 8.9 8.9 - 10.3 mg/dL   Total Protein 6.6 6.5 - 8.1 g/dL   Albumin 3.4 (L) 3.5 - 5.0 g/dL   AST 16 15 - 41 U/L   ALT 12 0 - 44 U/L   Alkaline Phosphatase 41 38 - 126 U/L   Total Bilirubin 0.6 0.3 - 1.2 mg/dL   GFR, Estimated 19 (L) >60 mL/min    Comment:  (NOTE) Calculated using the CKD-EPI Creatinine Equation (2021)    Anion gap 11 5 - 15    Comment: Performed at [facility], [address] [place], [place] [address]  Lipase, blood     Status: None   Collection Time: 09/22/21  5:57 PM  Result Value Ref Range   Lipase 39 11 - 51 U/L    Comment: Performed at [facility], [address] [place], [place] [address]  CBC with Diff     Status: Abnormal   Collection Time: 09/22/21  5:57 PM  Result Value Ref Range   WBC 5.5 4.0 - 10.5 K/uL   RBC 3.49 (L) 3.87 - 5.11 MIL/uL   Hemoglobin 10.3 (L) 12.0 - 15.0 g/dL   HCT 31.5 (L) 36.0 - 46.0 %   MCV 90.3 80.0 - 100.0 fL   MCH 29.5 26.0 - 34.0 pg   MCHC 32.7 30.0 - 36.0 g/dL   RDW 13.5 11.5 - 15.5 %   Platelets 283 150 - 400 K/uL   nRBC 0.0 0.0 - 0.2 %   Neutrophils Relative % 58 %   Neutro Abs 3.2 1.7 - 7.7 K/uL   Lymphocytes Relative 31 %   Lymphs Abs 1.7 0.7 - 4.0 K/uL   Monocytes Relative 9 %   Monocytes Absolute 0.5 0.1 - 1.0 K/uL   Eosinophils Relative 2 %   Eosinophils Absolute 0.1 0.0 - 0.5 K/uL   Basophils Relative 0 %   Basophils Absolute 0.0 0.0 - 0.1 K/uL   Immature Granulocytes 0 %   Abs Immature Granulocytes 0.01 0.00 - 0.07 K/uL    Comment: Performed at [facility], [address] [place], [place] [address]  Troponin I (High Sensitivity)     Status: None   Collection Time: 09/22/21  5:57 PM  Result Value Ref Range  Troponin I (High Sensitivity) 17 <18 ng/L    Comment: (NOTE) Elevated high sensitivity troponin I (hsTnI) values and significant  changes across serial measurements may suggest ACS but many other  chronic and acute conditions are known to elevate hsTnI results.  Refer to the "Links" section for chest pain algorithms and additional  guidance. Performed at [facility], [address] [place], [place] [address]   Troponin I (High Sensitivity)     Status: None   Collection Time: 09/22/21  8:29 PM  Result  Value Ref Range   Troponin I (High Sensitivity) 15 <18 ng/L    Comment: (NOTE) Elevated high sensitivity troponin I (hsTnI) values and significant  changes across serial measurements may suggest ACS but many other  chronic and acute conditions are known to elevate hsTnI results.  Refer to the "Links" section for chest pain algorithms and additional  guidance. Performed at [facility], [address] [place], [place] [address]   Resp Panel by RT-PCR (Flu A&B, Covid) Nasopharyngeal Swab     Status: None   Collection Time: 09/22/21 10:24 PM   Specimen: Nasopharyngeal Swab; Nasopharyngeal(NP) swabs in vial transport medium  Result Value Ref Range   SARS Coronavirus 2 by RT PCR NEGATIVE NEGATIVE    Comment: (NOTE) SARS-CoV-2 target nucleic acids are NOT DETECTED.  The SARS-CoV-2 RNA is generally detectable in upper respiratory specimens during the acute phase of infection. The lowest concentration of SARS-CoV-2 viral copies this assay can detect is 138 copies/mL. A negative result does not preclude SARS-Cov-2 infection and should not be used as the sole basis for treatment or other patient management decisions. A negative result may occur with  improper specimen collection/handling, submission of specimen other than nasopharyngeal swab, presence of viral mutation(s) within the areas targeted by this assay, and inadequate number of viral copies(<138 copies/mL). A negative result must be combined with clinical observations, patient history, and epidemiological information. The expected result is Negative.  Fact Sheet for Patients:  EntrepreneurPulse.com.au  Fact Sheet for Healthcare Providers:  IncredibleEmployment.be  This test is no t yet approved or cleared by the [place] FDA and  has been authorized for detection and/or diagnosis of SARS-CoV-2 by FDA under an Emergency Use Authorization (EUA). This EUA will remain  in effect  (meaning this test can be used) for the duration of the COVID-19 declaration under Section 564(b)(1) of the Act, 21 U.S.C.section 360bbb-3(b)(1), unless the authorization is terminated  or revoked sooner.       Influenza A by PCR NEGATIVE NEGATIVE   Influenza B by PCR NEGATIVE NEGATIVE    Comment: (NOTE) The Xpert Xpress SARS-CoV-2/FLU/RSV plus assay is intended as an aid in the diagnosis of influenza from Nasopharyngeal swab specimens and should not be used as a sole basis for treatment. Nasal washings and aspirates are unacceptable for Xpert Xpress SARS-CoV-2/FLU/RSV testing.  Fact Sheet for Patients: EntrepreneurPulse.com.au  Fact Sheet for Healthcare Providers: IncredibleEmployment.be  This test is not yet approved or cleared by the [place] FDA and has been authorized for detection and/or diagnosis of SARS-CoV-2 by FDA under an Emergency Use Authorization (EUA). This EUA will remain in effect (meaning this test can be used) for the duration of the COVID-19 declaration under Section 564(b)(1) of the Act, 21 U.S.C. section 360bbb-3(b)(1), unless the authorization is terminated or revoked.  Performed at [facility], [address] [place], [place] [address]   CBC     Status:  Abnormal   Collection Time: 09/23/21  8:43 AM  Result Value Ref Range   WBC 4.9 4.0 - 10.5 K/uL   RBC 3.39 (L) 3.87 - 5.11 MIL/uL   Hemoglobin 10.0 (L) 12.0 - 15.0 g/dL   HCT 31.9 (L) 36.0 - 46.0 %   MCV 94.1 80.0 - 100.0 fL   MCH 29.5 26.0 - 34.0 pg   MCHC 31.3 30.0 - 36.0 g/dL   RDW 13.4 11.5 - 15.5 %   Platelets 244 150 - 400 K/uL   nRBC 0.0 0.0 - 0.2 %    Comment: Performed at [facility], [place] [address], [place], [place] [address]  Comprehensive metabolic panel     Status: Abnormal   Collection Time: 09/23/21  8:43 AM  Result Value Ref Range   Sodium 140 135 - 145 mmol/L   Potassium 4.8 3.5 - 5.1 mmol/L   Chloride  105 98 - 111 mmol/L   CO2 25 22 - 32 mmol/L   Glucose, Bld 117 (H) 70 - 99 mg/dL    Comment: Glucose reference range applies only to samples taken after fasting for at least 8 hours.   BUN 39 (H) 8 - 23 mg/dL   Creatinine, Ser 2.84 (H) 0.44 - 1.00 mg/dL   Calcium 8.6 (L) 8.9 - 10.3 mg/dL   Total Protein 6.6 6.5 - 8.1 g/dL   Albumin 3.2 (L) 3.5 - 5.0 g/dL   AST 18 15 - 41 U/L   ALT 11 0 - 44 U/L   Alkaline Phosphatase 38 38 - 126 U/L   Total Bilirubin 0.7 0.3 - 1.2 mg/dL   GFR, Estimated 17 (L) >60 mL/min    Comment: (NOTE) Calculated using the CKD-EPI Creatinine Equation (2021)    Anion gap 10 5 - 15    Comment: Performed at [facility], [place] [address], [place], [place] [address]  Magnesium     Status: Abnormal   Collection Time: 09/23/21  8:43 AM  Result Value Ref Range   Magnesium 2.5 (H) 1.7 - 2.4 mg/dL    Comment: Performed at [facility], [place] [address], [place], [place] [address]   CT ABDOMEN PELVIS WO CONTRAST  Result Date: 09/22/2021 CLINICAL DATA:  Left lower quadrant abdominal pain, nausea and vomiting since 5 a.m. EXAM: CT ABDOMEN AND PELVIS WITHOUT CONTRAST TECHNIQUE: Multidetector CT imaging of the abdomen and pelvis was performed following the standard protocol without IV contrast. RADIATION DOSE REDUCTION: This exam was performed according to the departmental dose-optimization program which includes automated exposure control, adjustment of the mA and/or kV according to patient size and/or use of iterative reconstruction technique. COMPARISON:  None. FINDINGS: Lower chest: No acute pleural or parenchymal lung disease. Hepatobiliary: Unremarkable unenhanced appearance of the liver and gallbladder. Pancreas: Unremarkable unenhanced appearance. Spleen: Unremarkable unenhanced appearance. Adrenals/Urinary Tract: Bilateral renal atrophy consistent with chronic renal insufficiency. 1.4 cm right renal cyst. No urinary tract calculi or  obstructive uropathy within either kidney. The adrenals and bladder are unremarkable. Stomach/Bowel: Marked gastric distension, with dilation of the small bowel measuring up to 3.5 cm and multiple small bowel gas fluid levels consistent with high-grade small bowel obstruction. Decompressed loops of distal jejunum and ileum are seen within the pelvis and right lower quadrant. Exact transition point is difficult to visualized. The colon is decompressed. No bowel wall thickening or inflammatory change. Vascular/Lymphatic: Diffuse atherosclerosis. Evaluation of the vascular lumen is limited without IV contrast. No pathologic adenopathy. Reproductive: Status post hysterectomy.  No adnexal masses. Other: No free fluid or free gas. Pelvic floor laxity with rectal and bladder prolapse. Please correlate with physical exam findings. No abdominal wall hernia. Musculoskeletal: No acute or destructive bony lesions. Reconstructed images demonstrate no additional findings. IMPRESSION: 1. High-grade small bowel obstruction, transition point within the distal jejunum or ileum. 2. No urinary tract calculi or obstructive uropathy. 3. Pelvic floor laxity, with rectal and bladder prolapse. 4.  Aortic Atherosclerosis (ICD10-I70.0). Electronically Signed   By: Randa Ngo M.D.   On: 09/22/2021 22:06   DG Abd 1 View  Result Date: 09/23/2021 CLINICAL DATA:  NG tube placement. EXAM: ABDOMEN - 1 VIEW COMPARISON:  None. FINDINGS: NG tube tip is positioned in the lateral stomach. Proximal side port is below the GE junction. There is some persistent mild diffuse gaseous bowel distension. IMPRESSION: NG tube tip is in the stomach with proximal side port below the GE junction. Electronically Signed   By: Misty Stanley M.D.   On: 09/23/2021 05:36   DG Abd Portable 1 View  Result Date: 09/22/2021 CLINICAL DATA:  ng tube placement EXAM: PORTABLE ABDOMEN - 1 VIEW COMPARISON:  None. FINDINGS: Enteric tube placement with tip coursing below the  hemidiaphragm with tip and side port overlying the expected region of the gastric lumen. The bowel gas pattern is normal. No radio-opaque calculi or other significant radiographic abnormality are seen. Atherosclerotic plaque of the aorta. IMPRESSION: 1. Enteric tube in appropriate position. Could be retracted by 5 cm. 2.  Aortic Atherosclerosis (ICD10-I70.0). Electronically Signed   By: Iven Finn M.D.   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  This care required moderate level of medical decision making.   Norm Parcel, [facility] 09/23/2021, 10:12 AM Please see Amion for pager number during day hours 7:00am-4:30pm

## 2021-09-23 NOTE — Progress Notes (Signed)
Xray notified that gastrografin was administered at 1100 they will come up in 8 hours to do abdominal xray.

## 2021-09-23 NOTE — Telephone Encounter (Signed)
Left message for patient to see her decision on a Cardiologist. RN unaware patient is currently admitted!

## 2021-09-23 NOTE — Progress Notes (Signed)
Pt NG tube found to be out. Reinserted new NG tube and verified NG tube placement by x-ray. On-call MD notified. Started Ng tube to [employer].

## 2021-09-23 NOTE — Progress Notes (Signed)
Patients pre-filled pill container sent to pharmacy, security slip placed in chart.

## 2021-09-23 NOTE — Progress Notes (Addendum)
Pt NG tube found to be pulled and out. On-call MD notified and aware. Pt is agitated, noncompliant, and refusing care. This is the second NG tubed placed today.

## 2021-09-23 NOTE — H&P (Signed)
History and Physical    Teresa Poole ZOX:[id number] DOB: 08-17-1951 DOA: 09/22/2021  PCP: [name], MD Patient coming from: home  Chief Complaint: nausea vomiting  HPI: Teresa Poole is a 71 y.o. female with medical history significant of type 2 diabetes, hypertension, hyperlipidemia, nonischemic cardiomyopathy, GERD, stage IV CKD, CHF, diastolic heart failure, history of ovarian cancer and abdominal surgery including hysterectomy admitted with abdominal pain nausea and vomiting. She reports that she has been burping a lot but she has not passed any gas.  She has not had a BM today.  She thinks her last bowel movement was last night. She denies any fever or chills.  She reports that she woke up at 3 in the morning and vomited.  She has not vomited in 45 years. She feels like she has sulfuric acid coming from her GERD and is burning all her skin.  And reports she has had lesions on her skin which has been healed.  She denies any dysuria. No fever chills. Denies chest pain shortness of breath or palpitations. She lives alone.  ED Course: Patient was [facility].  She was given Zofran, Coreg, Pepcid, Maalox, and started on IV fluids. CT abdomen 09/22/2021 with high-grade small bowel obstruction with transition point in the distal jejunum or ileum.  Review of Systems: See above Ambulatory Status: Ambulatory at baseline  Past Medical History:  Diagnosis Date   Allergy    Anemia    Carotid stenosis    Chronic combined systolic and diastolic CHF (congestive heart failure) (HCC)    CKD (chronic kidney disease), stage IV ([place]) 09/24/2013   Pt at [facility], Dr. Joelyn Oms   Diabetes mellitus type 2 in nonobese [facility])    Edema 09/14/2013   GERD (gastroesophageal reflux disease)    History of ovarian cancer    Hyperlipidemia    Hypertension    Mild CAD    non-obstructive by LHC (09/25/2013): Proximal and mid LAD serial 20%, proximal circumflex 30%,  mid AV groove circumflex 30%, mid RCA mild plaque.   NICM (nonischemic cardiomyopathy) (HCC)    Obesity (BMI 30-39.9)    RBBB    Renal artery stenosis ([place] [address]) 10/11/2018   Thyroid disease    Seen by specialist    Past Surgical History:  Procedure Laterality Date   LEFT HEART CATHETERIZATION WITH CORONARY ANGIOGRAM N/A 09/25/2013   Procedure: LEFT HEART CATHETERIZATION WITH CORONARY ANGIOGRAM;  Surgeon: Burnell Blanks, MD;  Location: [facility] CATH LAB;  Service: Cardiovascular;  Laterality: N/A;   RENAL ANGIOGRAPHY N/A 10/06/2018   Procedure: RENAL ANGIOGRAPHY;  Surgeon: Marty Heck, MD;  Location: [facility] CV LAB;  Service: Cardiovascular;  Laterality: N/A;    Social History   Socioeconomic History   Marital status: Single    Spouse name: Not on file   Number of children: 0   Years of education: Not on file   Highest education level: Not on file  Occupational History    Employer: [employer]  Tobacco Use   Smoking status: Never   Smokeless tobacco: Never  Vaping Use   Vaping Use: Never used  Substance and Sexual Activity   Alcohol use: No   Drug use: No   Sexual activity: Not on file  Other Topics Concern   Not on file  Social History Narrative   Works at [employer]   Patient lives at home alone.    Patient has no children.  Patient patient is right handed.    Patient is single.    Social Determinants of Health   Financial Resource Strain: Not on file  Food Insecurity: Not on file  Transportation Needs: Not on file  Physical Activity: Not on file  Stress: Not on file  Social Connections: Not on file  Intimate Partner Violence: Not on file    Allergies  Allergen Reactions   Hydralazine Hcl Itching and Other (See Comments)    ENTIRE BODY = burning sensation, also in the eyes (they have become very sensitive to light)   Atorvastatin Other (See Comments)    Per MD    Family History  Problem Relation Age of Onset   Diabetes Father     Hypertension Father    Hypertension Maternal Grandfather       Prior to Admission medications   Medication Sig Start Date End Date Taking? Authorizing Provider  carvedilol (COREG) 25 MG tablet Take 25 mg by mouth 2 (two) times daily with a meal.    [provider]  cephALEXin (KEFLEX) 500 MG capsule Take 500 mg by mouth 3 (three) times daily. 08/15/21   [provider]  cycloSPORINE, PF, (CEQUA) 0.09 % SOLN Place 1 drop into both eyes in the morning and at bedtime. 12/18/20   [provider]  ezetimibe (ZETIA) 10 MG tablet Take 10 mg by mouth daily. 06/09/21   [provider]  glucose blood (ONE TOUCH ULTRA TEST) test strip Use to check blood sugar 3 times daily Dx code E11.65 Patient taking differently: 1 each by Other route See admin instructions. Use to check blood sugar 3 times daily Dx code E11.65 11/20/14   Elayne Snare, MD  lisinopril (PRINIVIL,ZESTRIL) 40 MG tablet Take 1 tablet (40 mg total) by mouth daily. 10/10/18   Copland, Gay Filler, MD  NIFEdipine (ADALAT CC) 60 MG 24 hr tablet Take 1 tablet (60 mg total) by mouth daily. 07/09/21   Daleen Bo, MD  omeprazole (PRILOSEC) 40 MG capsule Take 40 mg by mouth 2 (two) times daily. 06/08/21   [provider]  ONETOUCH VERIO test strip [place] Patient taking differently: 1 each by Other route in the morning, at noon, and at bedtime. 03/30/18   Elayne Snare, MD  OZEMPIC, 0.25 OR 0.5 MG/DOSE, 2 MG/1.5ML SOPN Inject 0.25 mg into the skin once a week. Sundays 05/12/21   [provider]  sucralfate (CARAFATE) 1 g tablet Take 1 tablet (1 g total) by mouth 4 (four) times daily -  with meals and at bedtime for 14 days. 08/28/21 09/11/21  Henderly, Britni A, PA-C    Physical Exam: Vitals:   09/22/21 2315 09/22/21 2330 09/23/21 0036 09/23/21 0538  BP: (!) 161/76 (!) 161/67 (!) 163/71 (!) 174/71  Pulse: 83 81 84 88  Resp:   17 14  Temp:   98.7 F (37.1 C) 99.1 F (37.3  C)  TempSrc:   Oral Oral  SpO2: 100% 96% 97% 99%  Weight:      Height:         General:  Appears in distress elderly chronically ill-appearing female  eyes:  PERRL, EOMI, normal lids, iris ENT:  grossly normal hearing, lips & tongue, mmm Neck:  no LAD, masses or thyromegaly Cardiovascular:  RRR, no m/r/g. No LE edema.  Respiratory:  CTA bilaterally, no w/r/r. Normal respiratory effort. Abdomen: Soft nondistended nontender diminished bowel sounds Skin:  no rash or induration seen on limited exam Musculoskeletal:  grossly normal tone BUE/BLE, good ROM, no bony abnormality Psychiatric:  grossly normal mood and affect, speech fluent and appropriate, AOx3 Neurologic:  CN 2-12 grossly intact, moves all extremities in coordinated fashion, sensation intact  Labs on Admission: I have personally reviewed following labs and imaging studies  CBC: Recent Labs  Lab 09/22/21 1757  WBC 5.5  NEUTROABS 3.2  HGB 10.3*  HCT 31.5*  MCV 90.3  PLT 924   Basic Metabolic Panel: Recent Labs  Lab 09/22/21 1757  NA 137  K 4.5  CL 101  CO2 25  GLUCOSE 139*  BUN 42*  CREATININE 2.70*  CALCIUM 8.9   GFR: Estimated Creatinine Clearance: 19.1 mL/min (A) (by C-G formula based on SCr of 2.7 mg/dL (H)). Liver Function Tests: Recent Labs  Lab 09/22/21 1757  AST 16  ALT 12  ALKPHOS 41  BILITOT 0.6  PROT 6.6  ALBUMIN 3.4*   Recent Labs  Lab 09/22/21 1757  LIPASE 39   No results for input(s): AMMONIA in the last 168 hours. Coagulation Profile: No results for input(s): INR, PROTIME in the last 168 hours. Cardiac Enzymes: No results for input(s): CKTOTAL, CKMB, CKMBINDEX, TROPONINI in the last 168 hours. BNP (last 3 results) No results for input(s): PROBNP in the last 8760 hours. HbA1C: No results for input(s): HGBA1C in the last 72 hours. CBG: No results for input(s): GLUCAP in the last 168 hours. Lipid Profile: No results for input(s): CHOL, HDL, LDLCALC, TRIG, CHOLHDL, LDLDIRECT  in the last 72 hours. Thyroid Function Tests: No results for input(s): TSH, T4TOTAL, FREET4, T3FREE, THYROIDAB in the last 72 hours. Anemia Panel: No results for input(s): VITAMINB12, FOLATE, FERRITIN, TIBC, IRON, RETICCTPCT in the last 72 hours. Urine analysis:    Component Value Date/Time   COLORURINE YELLOW 09/22/2021 1727   APPEARANCEUR HAZY (A) 09/22/2021 1727   LABSPEC 1.020 09/22/2021 1727   PHURINE 5.5 09/22/2021 1727   GLUCOSEU NEGATIVE 09/22/2021 1727   HGBUR NEGATIVE 09/22/2021 1727   BILIRUBINUR NEGATIVE 09/22/2021 1727   KETONESUR NEGATIVE 09/22/2021 1727   PROTEINUR 100 (A) 09/22/2021 1727   UROBILINOGEN 0.2 09/22/2013 1451   NITRITE NEGATIVE 09/22/2021 1727   LEUKOCYTESUR SMALL (A) 09/22/2021 1727    Creatinine Clearance: Estimated Creatinine Clearance: 19.1 mL/min (A) (by C-G formula based on SCr of 2.7 mg/dL (H)).  Sepsis Labs: @LABRCNTIP (procalcitonin:4,lacticidven:4) ) Recent Results (from the past 240 hour(s))  Urine Culture     Status: Abnormal   Collection Time: 09/15/21  7:50 AM   Specimen: Urine, Clean Catch  Result Value Ref Range Status   Specimen Description   Final    URINE, CLEAN CATCH Performed at [facility], [address] [place], [place] [address]    Special Requests   Final    NONE Performed at [facility], [address] [place], [place] [address]    Culture MULTIPLE SPECIES PRESENT, SUGGEST RECOLLECTION (A)  Final   Report Status 09/16/2021 FINAL  Final  Resp Panel by RT-PCR (Flu A&B, Covid) Nasopharyngeal Swab     Status: None   Collection Time: 09/22/21 10:24 PM   Specimen: Nasopharyngeal Swab; Nasopharyngeal(NP) swabs in vial transport medium  Result Value Ref Range Status   SARS Coronavirus 2 by RT PCR NEGATIVE NEGATIVE Final    Comment: (NOTE) SARS-CoV-2 target nucleic acids are NOT DETECTED.  The SARS-CoV-2 RNA is generally detectable in upper respiratory specimens during the acute phase of  infection. The lowest concentration of SARS-CoV-2 viral copies  this assay can detect is 138 copies/mL. A negative result does not preclude SARS-Cov-2 infection and should not be used as the sole basis for treatment or other patient management decisions. A negative result may occur with  improper specimen collection/handling, submission of specimen other than nasopharyngeal swab, presence of viral mutation(s) within the areas targeted by this assay, and inadequate number of viral copies(<138 copies/mL). A negative result must be combined with clinical observations, patient history, and epidemiological information. The expected result is Negative.  Fact Sheet for Patients:  EntrepreneurPulse.com.au  Fact Sheet for Healthcare Providers:  IncredibleEmployment.be  This test is no t yet approved or cleared by the [place] FDA and  has been authorized for detection and/or diagnosis of SARS-CoV-2 by FDA under an Emergency Use Authorization (EUA). This EUA will remain  in effect (meaning this test can be used) for the duration of the COVID-19 declaration under Section 564(b)(1) of the Act, 21 U.S.C.section 360bbb-3(b)(1), unless the authorization is terminated  or revoked sooner.       Influenza A by PCR NEGATIVE NEGATIVE Final   Influenza B by PCR NEGATIVE NEGATIVE Final    Comment: (NOTE) The Xpert Xpress SARS-CoV-2/FLU/RSV plus assay is intended as an aid in the diagnosis of influenza from Nasopharyngeal swab specimens and should not be used as a sole basis for treatment. Nasal washings and aspirates are unacceptable for Xpert Xpress SARS-CoV-2/FLU/RSV testing.  Fact Sheet for Patients: EntrepreneurPulse.com.au  Fact Sheet for Healthcare Providers: IncredibleEmployment.be  This test is not yet approved or cleared by the [place] FDA and has been authorized for detection and/or diagnosis of SARS-CoV-2  by FDA under an Emergency Use Authorization (EUA). This EUA will remain in effect (meaning this test can be used) for the duration of the COVID-19 declaration under Section 564(b)(1) of the Act, 21 U.S.C. section 360bbb-3(b)(1), unless the authorization is terminated or revoked.  Performed at [facility], [address] [place], [place] [address]      Radiological Exams on Admission: CT ABDOMEN PELVIS WO CONTRAST  Result Date: 09/22/2021 CLINICAL DATA:  Left lower quadrant abdominal pain, nausea and vomiting since 5 a.m. EXAM: CT ABDOMEN AND PELVIS WITHOUT CONTRAST TECHNIQUE: Multidetector CT imaging of the abdomen and pelvis was performed following the standard protocol without IV contrast. RADIATION DOSE REDUCTION: This exam was performed according to the departmental dose-optimization program which includes automated exposure control, adjustment of the mA and/or kV according to patient size and/or use of iterative reconstruction technique. COMPARISON:  None. FINDINGS: Lower chest: No acute pleural or parenchymal lung disease. Hepatobiliary: Unremarkable unenhanced appearance of the liver and gallbladder. Pancreas: Unremarkable unenhanced appearance. Spleen: Unremarkable unenhanced appearance. Adrenals/Urinary Tract: Bilateral renal atrophy consistent with chronic renal insufficiency. 1.4 cm right renal cyst. No urinary tract calculi or obstructive uropathy within either kidney. The adrenals and bladder are unremarkable. Stomach/Bowel: Marked gastric distension, with dilation of the small bowel measuring up to 3.5 cm and multiple small bowel gas fluid levels consistent with high-grade small bowel obstruction. Decompressed loops of distal jejunum and ileum are seen within the pelvis and right lower quadrant. Exact transition point is difficult to visualized. The colon is decompressed. No bowel wall thickening or inflammatory change. Vascular/Lymphatic: Diffuse atherosclerosis. Evaluation  of the vascular lumen is limited without IV contrast. No pathologic adenopathy. Reproductive: Status post hysterectomy. No adnexal masses. Other: No free fluid or free gas. Pelvic floor laxity with rectal and bladder prolapse. Please correlate with physical exam findings. No abdominal wall  hernia. Musculoskeletal: No acute or destructive bony lesions. Reconstructed images demonstrate no additional findings. IMPRESSION: 1. High-grade small bowel obstruction, transition point within the distal jejunum or ileum. 2. No urinary tract calculi or obstructive uropathy. 3. Pelvic floor laxity, with rectal and bladder prolapse. 4.  Aortic Atherosclerosis (ICD10-I70.0). Electronically Signed   By: Randa Ngo M.D.   On: 09/22/2021 22:06   DG Abd 1 View  Result Date: 09/23/2021 CLINICAL DATA:  NG tube placement. EXAM: ABDOMEN - 1 VIEW COMPARISON:  None. FINDINGS: NG tube tip is positioned in the lateral stomach. Proximal side port is below the GE junction. There is some persistent mild diffuse gaseous bowel distension. IMPRESSION: NG tube tip is in the stomach with proximal side port below the GE junction. Electronically Signed   By: Misty Stanley M.D.   On: 09/23/2021 05:36   DG Abd Portable 1 View  Result Date: 09/22/2021 CLINICAL DATA:  ng tube placement EXAM: PORTABLE ABDOMEN - 1 VIEW COMPARISON:  None. FINDINGS: Enteric tube placement with tip coursing below the hemidiaphragm with tip and side port overlying the expected region of the gastric lumen. The bowel gas pattern is normal. No radio-opaque calculi or other significant radiographic abnormality are seen. Atherosclerotic plaque of the aorta. IMPRESSION: 1. Enteric tube in appropriate position. Could be retracted by 5 cm. 2.  Aortic Atherosclerosis (ICD10-I70.0). Electronically Signed   By: Iven Finn M.D.   On: 09/22/2021 23:45      Assessment/Plan Principal Problem:   SBO (small bowel obstruction) (HCC)   #1 small bowel obstruction patient  with history of prior abdominal surgeries.  This is the first time she is reporting or having small bowel obstruction. Patient seen by general surgery on small bowel obstruction protocol.  Has an NG tube in place. Continue n.p.o. IV fluids NG tube to low intermittent suction. PT consult Out of bed ambulate Optimize electrolytes potassium 4.0 mag is 2.5  #2 history of nonischemic heart failure currently euvolemic on Lasix 20 mg daily with lisinopril at home has not been restarted as patient is NPO.  She is on IV fluids monitor closely for fluid overload.  #3 hyperlipidemia on Zetia at home  #4 history of essential hypertension on lisinopril, Lasix, nifedipine at home currently on Lopressor as needed.  #5 history of type 2 diabetes on Ozempic patient is n.p.o. Check a1c CBG (last 3)  No results for input(s): GLUCAP in the last 72 hours.     Estimated body mass index is 24.28 kg/m as calculated from the following:   Height as of this encounter: 5\' 7"  (1.702 m).   Weight as of this encounter: 70.3 kg.   DVT prophylaxis: Heparin Code Status: Full code Family Communication: None at bedside Disposition Plan: Pending improvement Consults called: General surgery Admission status: Inpatient   Georgette Shell MD   09/23/2021, 8:51 AM

## 2021-09-24 ENCOUNTER — Inpatient Hospital Stay (HOSPITAL_COMMUNITY): Payer: Medicare PPO

## 2021-09-24 ENCOUNTER — Ambulatory Visit (HOSPITAL_BASED_OUTPATIENT_CLINIC_OR_DEPARTMENT_OTHER): Payer: Medicare PPO | Admitting: Family

## 2021-09-24 DIAGNOSIS — I5042 Chronic combined systolic (congestive) and diastolic (congestive) heart failure: Secondary | ICD-10-CM

## 2021-09-24 DIAGNOSIS — I110 Hypertensive heart disease with heart failure: Secondary | ICD-10-CM

## 2021-09-24 DIAGNOSIS — E782 Mixed hyperlipidemia: Secondary | ICD-10-CM

## 2021-09-24 DIAGNOSIS — I160 Hypertensive urgency: Secondary | ICD-10-CM

## 2021-09-24 LAB — CBC WITH DIFFERENTIAL/PLATELET
Abs Immature Granulocytes: 0.01 10*3/uL (ref 0.00–0.07)
Basophils Absolute: 0 10*3/uL (ref 0.0–0.1)
Basophils Relative: 0 %
Eosinophils Absolute: 0.1 10*3/uL (ref 0.0–0.5)
Eosinophils Relative: 2 %
HCT: 31.4 % — ABNORMAL LOW (ref 36.0–46.0)
Hemoglobin: 10 g/dL — ABNORMAL LOW (ref 12.0–15.0)
Immature Granulocytes: 0 %
Lymphocytes Relative: 21 %
Lymphs Abs: 1 10*3/uL (ref 0.7–4.0)
MCH: 29.7 pg (ref 26.0–34.0)
MCHC: 31.8 g/dL (ref 30.0–36.0)
MCV: 93.2 fL (ref 80.0–100.0)
Monocytes Absolute: 0.4 10*3/uL (ref 0.1–1.0)
Monocytes Relative: 8 %
Neutro Abs: 3.3 10*3/uL (ref 1.7–7.7)
Neutrophils Relative %: 69 %
Platelets: 241 10*3/uL (ref 150–400)
RBC: 3.37 MIL/uL — ABNORMAL LOW (ref 3.87–5.11)
RDW: 13 % (ref 11.5–15.5)
WBC: 4.8 10*3/uL (ref 4.0–10.5)
nRBC: 0 % (ref 0.0–0.2)

## 2021-09-24 LAB — GLUCOSE, CAPILLARY
Glucose-Capillary: 70 mg/dL (ref 70–99)
Glucose-Capillary: 94 mg/dL (ref 70–99)
Glucose-Capillary: 98 mg/dL (ref 70–99)
Glucose-Capillary: 140 mg/dL — ABNORMAL HIGH (ref 70–99)

## 2021-09-24 LAB — BASIC METABOLIC PANEL
Anion gap: 9 (ref 5–15)
BUN: 33 mg/dL — ABNORMAL HIGH (ref 8–23)
CO2: 24 mmol/L (ref 22–32)
Calcium: 8.6 mg/dL — ABNORMAL LOW (ref 8.9–10.3)
Chloride: 108 mmol/L (ref 98–111)
Creatinine, Ser: 2.27 mg/dL — ABNORMAL HIGH (ref 0.44–1.00)
GFR, Estimated: 23 mL/min — ABNORMAL LOW (ref >60)
Glucose, Bld: 76 mg/dL (ref 70–99)
Potassium: 4.3 mmol/L (ref 3.5–5.1)
Sodium: 141 mmol/L (ref 135–145)

## 2021-09-24 LAB — MAGNESIUM: Magnesium: 2.1 mg/dL (ref 1.7–2.4)

## 2021-09-24 LAB — HEMOGLOBIN A1C
Hgb A1c MFr Bld: 5.7 % — ABNORMAL HIGH (ref 4.8–5.6)
Mean Plasma Glucose: 116.89 mg/dL

## 2021-09-24 MED ORDER — DOCUSATE SODIUM 100 MG PO CAPS
100.0000 mg | ORAL_CAPSULE | Freq: Two times a day (BID) | ORAL | Status: DC
  Administered 2021-09-24 – 2021-09-27 (×6): 100 mg via ORAL

## 2021-09-24 MED ORDER — PANTOPRAZOLE SODIUM 40 MG IV SOLR
40.0000 mg | Freq: Every day | INTRAVENOUS | Status: DC
  Administered 2021-09-24: 40 mg via INTRAVENOUS

## 2021-09-24 MED ORDER — HYDRALAZINE HCL 20 MG/ML IJ SOLN
10.0000 mg | Freq: Four times a day (QID) | INTRAMUSCULAR | Status: DC | PRN
  Administered 2021-09-24 – 2021-09-25 (×2): 10 mg via INTRAVENOUS

## 2021-09-24 MED ORDER — ENALAPRILAT 1.25 MG/ML IV SOLN
0.6250 mg | Freq: Four times a day (QID) | INTRAVENOUS | Status: DC
  Administered 2021-09-24 (×2): 0.625 mg via INTRAVENOUS
  Filled 2021-09-24 (×3): qty 0.5

## 2021-09-24 MED ORDER — DEXTROSE-NACL 5-0.9 % IV SOLN: INTRAVENOUS | Status: DC

## 2021-09-24 MED ORDER — ENALAPRILAT 1.25 MG/ML IV SOLN
1.2500 mg | Freq: Four times a day (QID) | INTRAVENOUS | Status: DC
  Administered 2021-09-24 – 2021-09-26 (×7): 1.25 mg via INTRAVENOUS
  Filled 2021-09-24 (×8): qty 1

## 2021-09-24 MED ORDER — HYDRALAZINE HCL 20 MG/ML IJ SOLN
10.0000 mg | Freq: Once | INTRAMUSCULAR | Status: AC
  Administered 2021-09-24: 10 mg via INTRAVENOUS

## 2021-09-24 NOTE — Progress Notes (Signed)
PROGRESS NOTE    Teresa Poole  NIO:[id number] DOB: [birth year] DOA: 09/22/2021 PCP: [name], MD    Chief Complaint  Patient presents with   Abdominal Pain    Brief Narrative:  Patient 71 year old female history of type 2 diabetes, hypertension, hyperlipidemia, nonischemic cardiomyopathy, GERD, stage IV CKD, CHF, diastolic CHF, history of ovarian cancer and abdominal surgeries including hysterectomy presented to the ED with abdominal pain nausea vomiting.  CT abdomen and pelvis concerning for small bowel obstruction.  Patient placed on bowel rest, conservative treatment, NG tube placed which patient removed.  General surgery consulted and following   Assessment & Plan:   Principal Problem:   SBO (small bowel obstruction) (HCC) Active Problems:   Uncontrolled hypertension   Obesity (BMI 30-39.9)   Hypertensive heart disease with CHF (congestive heart failure) (HCC)   Hypertensive urgency   Stage 4 chronic kidney disease (HCC)   Chronic combined systolic and diastolic heart failure (HCC)   Mixed hyperlipidemia   Adjustment disorder with anxiety  #1 small bowel obstruction -Likely secondary to adhesions as patient with prior abdominal surgeries. -Patient presented nausea vomiting abdominal pain. -CT abdomen and pelvis done concerning for small bowel obstruction with transition point noted. -NG tube was placed but seems to have been dislodged overnight. -Patient with some clinical improvement. -Patient being followed by general surgery and undergoing small bowel protocol. -Continue bowel rest, IV fluids, supportive care. -Keep potassium approximately 4, magnesium > 2. -Per general surgery.  2.  Hypertensive urgency/hypertension -Systolic blood pressures noted in the 180s to low 200s per RN today. -Continue current regimen of IV Lopressor. -Start IV enalapril while patient NPO. -IV hydralazine as needed.  3.  History of nonischemic  cardiomyopathy -Stable. -Noted to be on oral Lasix prior to admission and ACE inhibitor. -Continue gentle hydration and monitor closely. -Placed on IV enalapril.  4.  Hyperlipidemia -Continue to hold Zetia until patient tolerating oral intake.  5.  Chronic combined systolic and diastolic heart failure -Stable. -Compensated. -Continue IV beta-blocker.  Placed on IV enalapril for better blood pressure control. -Continue to hold oral diuretics.  6.  CKD stage IV -Stable. -Monitor fluid status with hydration.   DVT prophylaxis: Heparin Code Status: Full Family Communication: Updated patient.  No family at bedside. Disposition:   Status is: Inpatient Remains inpatient appropriate because: Severity of illness  Planned Discharge Destination: Home         Consultants:  General surgery: Dr.Stechschulte 09/23/2021  Procedures:  CT abdomen and pelvis 09/22/2021 Small bowel protocol   Antimicrobials:  None   Subjective: Patient sitting up in chair.  Some complaints of mild abdominal discomfort.  No nausea or vomiting.  Stated had a small bowel movement last night and this morning.  NG tube out.  No nausea or emesis.  Objective: Vitals:   09/24/21 1330 09/24/21 1416 09/24/21 1420 09/24/21 1505  BP: (!) 202/90  (!) 208/100 (!) 188/90  Pulse: 84 80  88  Resp: 16   16  Temp:  98.6 F (37 C)  98.5 F (36.9 C)  TempSrc:  Oral  Oral  SpO2: 99% 98%  98%  Weight:      Height:        Intake/Output Summary (Last 24 hours) at 09/24/2021 1731 Last data filed at 09/24/2021 1300 Gross per 24 hour  Intake 718.06 ml  Output 1550 ml  Net -831.94 ml   Filed Weights   09/22/21 1719  Weight: 70.3 kg    Examination:  General exam: Appears calm and comfortable  Respiratory system: Clear to auscultation. Respiratory effort normal. Cardiovascular system: S1 & S2 heard, RRR. No JVD, murmurs, rubs, gallops or clicks. No pedal edema. Gastrointestinal system: Abdomen is  nondistended, soft and nontender. No organomegaly or masses felt. Normal bowel sounds heard. Central nervous system: Alert and oriented. No focal neurological deficits.  Moving extremities spontaneously Extremities: Symmetric 5 x 5 power. Skin: No rashes, lesions or ulcers Psychiatry: Judgement and insight appear fair.. Mood & affect appropriate.     Data Reviewed: I have personally reviewed following labs and imaging studies  CBC: Recent Labs  Lab 09/22/21 1757 09/23/21 0843 09/24/21 0834  WBC 5.5 4.9 4.8  NEUTROABS 3.2  --  3.3  HGB 10.3* 10.0* 10.0*  HCT 31.5* 31.9* 31.4*  MCV 90.3 94.1 93.2  PLT 283 244 892    Basic Metabolic Panel: Recent Labs  Lab 09/22/21 1757 09/23/21 0843 09/24/21 0834  NA 137 140 141  K 4.5 4.8 4.3  CL 101 105 108  CO2 25 25 24   GLUCOSE 139* 117* 76  BUN 42* 39* 33*  CREATININE 2.70* 2.84* 2.27*  CALCIUM 8.9 8.6* 8.6*  MG  --  2.5* 2.1    GFR: Estimated Creatinine Clearance: 22.7 mL/min (A) (by C-G formula based on SCr of 2.27 mg/dL (H)).  Liver Function Tests: Recent Labs  Lab 09/22/21 1757 09/23/21 0843  AST 16 18  ALT 12 11  ALKPHOS 41 38  BILITOT 0.6 0.7  PROT 6.6 6.6  ALBUMIN 3.4* 3.2*    CBG: Recent Labs  Lab 09/23/21 1710 09/23/21 2141 09/24/21 0723 09/24/21 1110 09/24/21 1623  GLUCAP 82 86 70 94 98     Recent Results (from the past 240 hour(s))  Urine Culture     Status: Abnormal   Collection Time: 09/15/21  7:50 AM   Specimen: Urine, Clean Catch  Result Value Ref Range Status   Specimen Description   Final    URINE, CLEAN CATCH Performed at [facility], [address] [place], [place] [address]    Special Requests   Final    NONE Performed at [facility], [address] [place], [place] [address]    Culture MULTIPLE SPECIES PRESENT, SUGGEST RECOLLECTION (A)  Final   Report Status 09/16/2021 FINAL  Final  Resp Panel by RT-PCR (Flu A&B, Covid) Nasopharyngeal Swab     Status:  None   Collection Time: 09/22/21 10:24 PM   Specimen: Nasopharyngeal Swab; Nasopharyngeal(NP) swabs in vial transport medium  Result Value Ref Range Status   SARS Coronavirus 2 by RT PCR NEGATIVE NEGATIVE Final    Comment: (NOTE) SARS-CoV-2 target nucleic acids are NOT DETECTED.  The SARS-CoV-2 RNA is generally detectable in upper respiratory specimens during the acute phase of infection. The lowest concentration of SARS-CoV-2 viral copies this assay can detect is 138 copies/mL. A negative result does not preclude SARS-Cov-2 infection and should not be used as the sole basis for treatment or other patient management decisions. A negative result may occur with  improper specimen collection/handling, submission of specimen other than nasopharyngeal swab, presence of viral mutation(s) within the areas targeted by this assay, and inadequate number of viral copies(<138 copies/mL). A negative result must be combined with clinical observations, patient history, and epidemiological information. The expected result is Negative.  Fact Sheet for Patients:  EntrepreneurPulse.com.au  Fact Sheet for Healthcare Providers:  IncredibleEmployment.be  This test is no t yet approved or cleared  by the [place] and  has been authorized for detection and/or diagnosis of SARS-CoV-2 by FDA under an Emergency Use Authorization (EUA). This EUA will remain  in effect (meaning this test can be used) for the duration of the COVID-19 declaration under Section 564(b)(1) of the Act, 21 U.S.C.section 360bbb-3(b)(1), unless the authorization is terminated  or revoked sooner.       Influenza A by PCR NEGATIVE NEGATIVE Final   Influenza B by PCR NEGATIVE NEGATIVE Final    Comment: (NOTE) The Xpert Xpress SARS-CoV-2/FLU/RSV plus assay is intended as an aid in the diagnosis of influenza from Nasopharyngeal swab specimens and should not be used as a sole basis for  treatment. Nasal washings and aspirates are unacceptable for Xpert Xpress SARS-CoV-2/FLU/RSV testing.  Fact Sheet for Patients: EntrepreneurPulse.com.au  Fact Sheet for Healthcare Providers: IncredibleEmployment.be  This test is not yet approved or cleared by the [place] FDA and has been authorized for detection and/or diagnosis of SARS-CoV-2 by FDA under an Emergency Use Authorization (EUA). This EUA will remain in effect (meaning this test can be used) for the duration of the COVID-19 declaration under Section 564(b)(1) of the Act, 21 U.S.C. section 360bbb-3(b)(1), unless the authorization is terminated or revoked.  Performed at [facility], [address] [place], [place] [address]          Radiology Studies: CT ABDOMEN PELVIS WO CONTRAST  Result Date: 09/22/2021 CLINICAL DATA:  Left lower quadrant abdominal pain, nausea and vomiting since 5 a.m. EXAM: CT ABDOMEN AND PELVIS WITHOUT CONTRAST TECHNIQUE: Multidetector CT imaging of the abdomen and pelvis was performed following the standard protocol without IV contrast. RADIATION DOSE REDUCTION: This exam was performed according to the departmental dose-optimization program which includes automated exposure control, adjustment of the mA and/or kV according to patient size and/or use of iterative reconstruction technique. COMPARISON:  None. FINDINGS: Lower chest: No acute pleural or parenchymal lung disease. Hepatobiliary: Unremarkable unenhanced appearance of the liver and gallbladder. Pancreas: Unremarkable unenhanced appearance. Spleen: Unremarkable unenhanced appearance. Adrenals/Urinary Tract: Bilateral renal atrophy consistent with chronic renal insufficiency. 1.4 cm right renal cyst. No urinary tract calculi or obstructive uropathy within either kidney. The adrenals and bladder are unremarkable. Stomach/Bowel: Marked gastric distension, with dilation of the small bowel  measuring up to 3.5 cm and multiple small bowel gas fluid levels consistent with high-grade small bowel obstruction. Decompressed loops of distal jejunum and ileum are seen within the pelvis and right lower quadrant. Exact transition point is difficult to visualized. The colon is decompressed. No bowel wall thickening or inflammatory change. Vascular/Lymphatic: Diffuse atherosclerosis. Evaluation of the vascular lumen is limited without IV contrast. No pathologic adenopathy. Reproductive: Status post hysterectomy. No adnexal masses. Other: No free fluid or free gas. Pelvic floor laxity with rectal and bladder prolapse. Please correlate with physical exam findings. No abdominal wall hernia. Musculoskeletal: No acute or destructive bony lesions. Reconstructed images demonstrate no additional findings. IMPRESSION: 1. High-grade small bowel obstruction, transition point within the distal jejunum or ileum. 2. No urinary tract calculi or obstructive uropathy. 3. Pelvic floor laxity, with rectal and bladder prolapse. 4.  Aortic Atherosclerosis (ICD10-I70.0). Electronically Signed   By: Randa Ngo M.D.   On: 09/22/2021 22:06   DG Abd 1 View  Result Date: 09/23/2021 CLINICAL DATA:  NG tube placement. EXAM: ABDOMEN - 1 VIEW COMPARISON:  None. FINDINGS: NG tube tip is positioned in the lateral stomach. Proximal side port is below the GE junction. There is  some persistent mild diffuse gaseous bowel distension. IMPRESSION: NG tube tip is in the stomach with proximal side port below the GE junction. Electronically Signed   By: Misty Stanley M.D.   On: 09/23/2021 05:36   DG Abd Portable 1V-Small Bowel Obstruction Protocol-24 hr delay  Result Date: 09/24/2021 CLINICAL DATA:  24 hour delayed small-bowel protocol. EXAM: PORTABLE ABDOMEN - 1 VIEW COMPARISON:  09/23/2021.  Abdomen and pelvis CT dated 09/22/2021. FINDINGS: The previously demonstrated small bowel contrast has progressed into normal caliber colon extending to  the rectosigmoid region with gas in normal caliber rectum. Mildly dilated small bowel loops have not changed significantly except these no longer contain contrast. Unremarkable bones. IMPRESSION: Mild partial small bowel obstruction with interval passage of all of the administrated contrast into normal caliber colon. Electronically Signed   By: Claudie Revering M.D.   On: 09/24/2021 11:33   DG Abd Portable 1V-Small Bowel Obstruction Protocol-initial, 8 hr delay  Result Date: 09/23/2021 CLINICAL DATA:  8 hour delay small-bowel protocol. EXAM: PORTABLE ABDOMEN - 1 VIEW COMPARISON:  Abdominal x-ray 09/23/2021. FINDINGS: Dilated small bowel loops are again seen measuring up to 3.2 cm similar to prior examination. Contrast is seen throughout these dilated small bowel loops. No contrast is seen within the colon or nondilated small bowel in the pelvis. No suspicious calcifications. No acute fractures. IMPRESSION: 1. Contrast is seen within dilated small bowel in the mid abdomen. No contrast is seen within distal small bowel or colon. Findings are compatible with small-bowel obstruction. Electronically Signed   By: Ronney Asters M.D.   On: 09/23/2021 19:43   DG Abd Portable 1 View  Result Date: 09/22/2021 CLINICAL DATA:  ng tube placement EXAM: PORTABLE ABDOMEN - 1 VIEW COMPARISON:  None. FINDINGS: Enteric tube placement with tip coursing below the hemidiaphragm with tip and side port overlying the expected region of the gastric lumen. The bowel gas pattern is normal. No radio-opaque calculi or other significant radiographic abnormality are seen. Atherosclerotic plaque of the aorta. IMPRESSION: 1. Enteric tube in appropriate position. Could be retracted by 5 cm. 2.  Aortic Atherosclerosis (ICD10-I70.0). Electronically Signed   By: Iven Finn M.D.   On: 09/22/2021 23:45        Scheduled Meds:  docusate sodium  100 mg Oral BID   enalaprilat  1.25 mg Intravenous Q6H   heparin injection (subcutaneous)  5,000  Units Subcutaneous Q8H   insulin aspart  0-6 Units Subcutaneous TID WC   metoprolol tartrate  5 mg Intravenous Q6H   pantoprazole (PROTONIX) IV  40 mg Intravenous Daily   Continuous Infusions:  dextrose 5 % and 0.9% NaCl 85 mL/hr at 09/24/21 0815     LOS: 1 day    Time spent: 35 minutes    Irine Seal, MD [facility]   To contact the attending provider between 7A-7P or the covering provider during after hours 7P-7A, please log into the web site [email or web address] and access using universal [place] password for that web site. If you do not have the password, please call the hospital operator.  09/24/2021, 5:31 PM

## 2021-09-24 NOTE — Progress Notes (Signed)
Progress Note     Subjective: Pt seems somewhat confused this AM but denies abdominal pain or nausea. NGT out this AM. She has another BM recorded in chart overnight.   Objective: Vital signs in last 24 hours: Temp:  [98.3 F (36.8 C)-99.1 F (37.3 C)] 98.3 F (36.8 C) (02/01 0550) Pulse Rate:  [78-91] 79 (02/01 0550) Resp:  [15-18] 15 (02/01 0550) BP: (159-197)/(65-81) 182/79 (02/01 0550) SpO2:  [97 %-100 %] 100 % (02/01 0550) Last BM Date: 09/24/21  Intake/Output from previous day: 01/31 0701 - 02/01 0700 In: 1650.1 [I.V.:1650.1] Out: 1600 [Urine:600; Emesis/NG output:1000] Intake/Output this shift: No intake/output data recorded.  PE: General: pleasant, WD, WN female who is laying in bed in NAD Lungs:  Respiratory effort nonlabored Abd: soft, NT, ND, +BS, no masses, hernias, or organomegaly Psych: A&Ox3 with an odd affect    Lab Results:  Recent Labs    09/23/21 0843 09/24/21 0834  WBC 4.9 4.8  HGB 10.0* 10.0*  HCT 31.9* 31.4*  PLT 244 241   BMET Recent Labs    09/22/21 1757 09/23/21 0843  NA 137 140  K 4.5 4.8  CL 101 105  CO2 25 25  GLUCOSE 139* 117*  BUN 42* 39*  CREATININE 2.70* 2.84*  CALCIUM 8.9 8.6*   PT/INR No results for input(s): LABPROT, INR in the last 72 hours. CMP     Component Value Date/Time   NA 140 09/23/2021 0843   NA 138 07/25/2018 0000   K 4.8 09/23/2021 0843   CL 105 09/23/2021 0843   CO2 25 09/23/2021 0843   GLUCOSE 117 (H) 09/23/2021 0843   BUN 39 (H) 09/23/2021 0843   BUN 41 (A) 07/25/2018 0000   CREATININE 2.84 (H) 09/23/2021 0843   CALCIUM 8.6 (L) 09/23/2021 0843   PROT 6.6 09/23/2021 0843   ALBUMIN 3.2 (L) 09/23/2021 0843   AST 18 09/23/2021 0843   ALT 11 09/23/2021 0843   ALKPHOS 38 09/23/2021 0843   BILITOT 0.7 09/23/2021 0843   GFRNONAA 17 (L) 09/23/2021 0843   GFRAA 26 (L) 11/27/2018 0649   Lipase     Component Value Date/Time   LIPASE 39 09/22/2021 1757       Studies/Results: CT ABDOMEN  PELVIS WO CONTRAST  Result Date: 09/22/2021 CLINICAL DATA:  Left lower quadrant abdominal pain, nausea and vomiting since 5 a.m. EXAM: CT ABDOMEN AND PELVIS WITHOUT CONTRAST TECHNIQUE: Multidetector CT imaging of the abdomen and pelvis was performed following the standard protocol without IV contrast. RADIATION DOSE REDUCTION: This exam was performed according to the departmental dose-optimization program which includes automated exposure control, adjustment of the mA and/or kV according to patient size and/or use of iterative reconstruction technique. COMPARISON:  None. FINDINGS: Lower chest: No acute pleural or parenchymal lung disease. Hepatobiliary: Unremarkable unenhanced appearance of the liver and gallbladder. Pancreas: Unremarkable unenhanced appearance. Spleen: Unremarkable unenhanced appearance. Adrenals/Urinary Tract: Bilateral renal atrophy consistent with chronic renal insufficiency. 1.4 cm right renal cyst. No urinary tract calculi or obstructive uropathy within either kidney. The adrenals and bladder are unremarkable. Stomach/Bowel: Marked gastric distension, with dilation of the small bowel measuring up to 3.5 cm and multiple small bowel gas fluid levels consistent with high-grade small bowel obstruction. Decompressed loops of distal jejunum and ileum are seen within the pelvis and right lower quadrant. Exact transition point is difficult to visualized. The colon is decompressed. No bowel wall thickening or inflammatory change. Vascular/Lymphatic: Diffuse atherosclerosis. Evaluation of the vascular lumen is limited without  IV contrast. No pathologic adenopathy. Reproductive: Status post hysterectomy. No adnexal masses. Other: No free fluid or free gas. Pelvic floor laxity with rectal and bladder prolapse. Please correlate with physical exam findings. No abdominal wall hernia. Musculoskeletal: No acute or destructive bony lesions. Reconstructed images demonstrate no additional findings. IMPRESSION:  1. High-grade small bowel obstruction, transition point within the distal jejunum or ileum. 2. No urinary tract calculi or obstructive uropathy. 3. Pelvic floor laxity, with rectal and bladder prolapse. 4.  Aortic Atherosclerosis (ICD10-I70.0). Electronically Signed   By: Randa Ngo M.D.   On: 09/22/2021 22:06   DG Abd 1 View  Result Date: 09/23/2021 CLINICAL DATA:  NG tube placement. EXAM: ABDOMEN - 1 VIEW COMPARISON:  None. FINDINGS: NG tube tip is positioned in the lateral stomach. Proximal side port is below the GE junction. There is some persistent mild diffuse gaseous bowel distension. IMPRESSION: NG tube tip is in the stomach with proximal side port below the GE junction. Electronically Signed   By: Misty Stanley M.D.   On: 09/23/2021 05:36   DG Abd Portable 1V-Small Bowel Obstruction Protocol-initial, 8 hr delay  Result Date: 09/23/2021 CLINICAL DATA:  8 hour delay small-bowel protocol. EXAM: PORTABLE ABDOMEN - 1 VIEW COMPARISON:  Abdominal x-ray 09/23/2021. FINDINGS: Dilated small bowel loops are again seen measuring up to 3.2 cm similar to prior examination. Contrast is seen throughout these dilated small bowel loops. No contrast is seen within the colon or nondilated small bowel in the pelvis. No suspicious calcifications. No acute fractures. IMPRESSION: 1. Contrast is seen within dilated small bowel in the mid abdomen. No contrast is seen within distal small bowel or colon. Findings are compatible with small-bowel obstruction. Electronically Signed   By: Ronney Asters M.D.   On: 09/23/2021 19:43   DG Abd Portable 1 View  Result Date: 09/22/2021 CLINICAL DATA:  ng tube placement EXAM: PORTABLE ABDOMEN - 1 VIEW COMPARISON:  None. FINDINGS: Enteric tube placement with tip coursing below the hemidiaphragm with tip and side port overlying the expected region of the gastric lumen. The bowel gas pattern is normal. No radio-opaque calculi or other significant radiographic abnormality are seen.  Atherosclerotic plaque of the aorta. IMPRESSION: 1. Enteric tube in appropriate position. Could be retracted by 5 cm. 2.  Aortic Atherosclerosis (ICD10-I70.0). Electronically Signed   By: Iven Finn M.D.   On: 09/22/2021 23:45    Anti-infectives: Anti-infectives (From admission, onward)    None        Assessment/Plan  SBO - CT 1/30 with high grade SBO with transition point in distal jejunum or ileum - she had a BM  again overnight - abdominal exam continues to be benign - SBO protocol started 1/31 - 8h delay with contrast still in small bowel  - 24h delay film today - no indication for emergent surgical intervention but we will follow    FEN: NPO, IVF VTE: SQH ID: no current abx   NICM CAD Chronic combined systolic and diastolic CHF CKD stage IV Carotid stenosis HLD HTN T2DM GERD hysterectomy for hx of ovarian cancer   LOS: 1 day   I reviewed nursing notes, hospitalist notes, last 24 h vitals and pain scores, last 48 h intake and output, last 24 h labs and trends, and last 24 h imaging results.  This care required moderate level of medical decision making.    Norm Parcel, [facility] Surgery 09/24/2021, 9:10 AM Please see Amion for pager number during day hours 7:00am-4:30pm

## 2021-09-24 NOTE — Evaluation (Signed)
Physical Therapy Evaluation Patient Details Name: Teresa Poole MRN: [id number] DOB: Mar 17, 1951 Today's Date: 09/24/2021  History of Present Illness  71 y.o. female with PMH: type 2 diabetes, hypertension, hyperlipidemia, nonischemic cardiomyopathy, GERD, stage IV CKD, CHF, diastolic heart failure, history of ovarian cancer and abdominal surgery including hysterectomy admitted with abdominal pain nausea and vomiting  Clinical Impression  Patient evaluated by Physical Therapy with no further acute PT needs identified. All education has been completed and the patient has no further questions.  Amb ~ 600', no device, no assist, steady gait without LOB. No further needs at this time   See below for any follow-up Physical Therapy or equipment needs. PT is signing off. Thank you for this referral.        Recommendations for follow up therapy are one component of a multi-disciplinary discharge planning process, led by the attending physician.  Recommendations may be updated based on patient status, additional functional criteria and insurance authorization.  Follow Up Recommendations No PT follow up    Assistance Recommended at Discharge PRN  Patient can return home with the following       Equipment Recommendations None recommended by PT  Recommendations for Other Services       Functional Status Assessment Patient has not had a recent decline in their functional status     Precautions / Restrictions Precautions Precautions: None Restrictions Weight Bearing Restrictions: No      Mobility  Bed Mobility Overal bed mobility: Independent                  Transfers Overall transfer level: Independent                 General transfer comment: from bed and toilet    Ambulation/Gait Ambulation/Gait assistance: Independent Gait Distance (Feet): 600 Feet Assistive device: None Gait Pattern/deviations: Step-through pattern Gait velocity: WNL     General Gait  Details: no LOB with gait over smooth, level surfaces. see balance section for further details  Stairs            Wheelchair Mobility    Modified Rankin (Stroke Patients Only)       Balance   Sitting-balance support: Feet supported, Feet unsupported, No upper extremity supported Sitting balance-Leahy Scale: Normal     Standing balance support: During functional activity, No upper extremity supported Standing balance-Leahy Scale: Good Standing balance comment: not tested to mod perturbations however no LOB with min challenges, reaching outside BOS, bending, bending and reaching; denies hx of falls             High level balance activites: Side stepping, Backward walking, Direction changes, Turns, Head turns High Level Balance Comments: no LOB with above             Pertinent Vitals/Pain Pain Assessment Pain Assessment: No/denies pain    Home Living Family/patient expects to be discharged to:: Private residence Living Arrangements: Alone   Type of Home: Other(Comment) (condo) Home Access: Stairs to enter Entrance Stairs-Rails: Right Entrance Stairs-Number of Steps: 15     Home Equipment: Cane - single point      Prior Function Prior Level of Function : Independent/Modified Independent                     Hand Dominance        Extremity/Trunk Assessment   Upper Extremity Assessment Upper Extremity Assessment: Overall WFL for tasks assessed    Lower Extremity Assessment Lower Extremity Assessment: Overall  WFL for tasks assessed       Communication   Communication: No difficulties  Cognition Arousal/Alertness: Awake/alert Behavior During Therapy: WFL for tasks assessed/performed Overall Cognitive Status: Within Functional Limits for tasks assessed                                          General Comments      Exercises     Assessment/Plan    PT Assessment Patient does not need any further PT services  PT  Problem List         PT Treatment Interventions      PT Goals (Current goals can be found in the Care Plan section)  Acute Rehab PT Goals PT Goal Formulation: All assessment and education complete, DC therapy    Frequency       Co-evaluation               AM-PAC PT "6 Clicks" Mobility  Outcome Measure Help needed turning from your back to your side while in a flat bed without using bedrails?: None Help needed moving from lying on your back to sitting on the side of a flat bed without using bedrails?: None Help needed moving to and from a bed to a chair (including a wheelchair)?: None Help needed standing up from a chair using your arms (e.g., wheelchair or bedside chair)?: None Help needed to walk in hospital room?: None Help needed climbing 3-5 steps with a railing? : None 6 Click Score: 24    End of Session Equipment Utilized During Treatment: Gait belt Activity Tolerance: Patient tolerated treatment well Patient left: in bed;with call bell/phone within reach;with bed alarm set   PT Visit Diagnosis: Other abnormalities of gait and mobility (R26.89)    Time: 1224-8250 PT Time Calculation (min) (ACUTE ONLY): 19 min   Charges:   PT Evaluation $PT Eval Low Complexity: [place], PT  Acute Rehab Dept ([place]) [phone] Pager [phone]  09/24/2021   [facility] 09/24/2021, 3:27 PM

## 2021-09-25 DIAGNOSIS — G9341 Metabolic encephalopathy: Secondary | ICD-10-CM | POA: Clinically undetermined

## 2021-09-25 LAB — CBC WITH DIFFERENTIAL/PLATELET
Abs Immature Granulocytes: 0.03 10*3/uL (ref 0.00–0.07)
Basophils Absolute: 0 10*3/uL (ref 0.0–0.1)
Basophils Relative: 0 %
Eosinophils Absolute: 0.1 10*3/uL (ref 0.0–0.5)
Eosinophils Relative: 2 %
HCT: 31.4 % — ABNORMAL LOW (ref 36.0–46.0)
Hemoglobin: 10.2 g/dL — ABNORMAL LOW (ref 12.0–15.0)
Immature Granulocytes: 1 %
Lymphocytes Relative: 23 %
Lymphs Abs: 1.4 10*3/uL (ref 0.7–4.0)
MCH: 29.7 pg (ref 26.0–34.0)
MCHC: 32.5 g/dL (ref 30.0–36.0)
MCV: 91.3 fL (ref 80.0–100.0)
Monocytes Absolute: 0.6 10*3/uL (ref 0.1–1.0)
Monocytes Relative: 10 %
Neutro Abs: 3.9 10*3/uL (ref 1.7–7.7)
Neutrophils Relative %: 64 %
Platelets: 258 10*3/uL (ref 150–400)
RBC: 3.44 MIL/uL — ABNORMAL LOW (ref 3.87–5.11)
RDW: 13 % (ref 11.5–15.5)
WBC: 6 10*3/uL (ref 4.0–10.5)
nRBC: 0 % (ref 0.0–0.2)

## 2021-09-25 LAB — BASIC METABOLIC PANEL
Anion gap: 6 (ref 5–15)
BUN: 19 mg/dL (ref 8–23)
CO2: 23 mmol/L (ref 22–32)
Calcium: 8.7 mg/dL — ABNORMAL LOW (ref 8.9–10.3)
Chloride: 110 mmol/L (ref 98–111)
Creatinine, Ser: 1.86 mg/dL — ABNORMAL HIGH (ref 0.44–1.00)
GFR, Estimated: 29 mL/min — ABNORMAL LOW (ref >60)
Glucose, Bld: 149 mg/dL — ABNORMAL HIGH (ref 70–99)
Potassium: 3.7 mmol/L (ref 3.5–5.1)
Sodium: 139 mmol/L (ref 135–145)

## 2021-09-25 LAB — GLUCOSE, CAPILLARY
Glucose-Capillary: 152 mg/dL — ABNORMAL HIGH (ref 70–99)
Glucose-Capillary: 155 mg/dL — ABNORMAL HIGH (ref 70–99)
Glucose-Capillary: 159 mg/dL — ABNORMAL HIGH (ref 70–99)
Glucose-Capillary: 173 mg/dL — ABNORMAL HIGH (ref 70–99)

## 2021-09-25 LAB — FOLATE: Folate: 16.3 ng/mL (ref >5.9)

## 2021-09-25 LAB — VITAMIN B12: Vitamin B-12: 507 pg/mL (ref 180–914)

## 2021-09-25 LAB — AMMONIA: Ammonia: 26 umol/L (ref 9–35)

## 2021-09-25 LAB — TSH: TSH: 1.729 u[IU]/mL (ref 0.350–4.500)

## 2021-09-25 LAB — MAGNESIUM: Magnesium: 1.7 mg/dL (ref 1.7–2.4)

## 2021-09-25 MED ORDER — PANTOPRAZOLE SODIUM 40 MG PO TBEC
40.0000 mg | DELAYED_RELEASE_TABLET | Freq: Every day | ORAL | Status: DC
  Administered 2021-09-25 – 2021-09-27 (×3): 40 mg via ORAL

## 2021-09-25 MED ORDER — NIFEDIPINE ER OSMOTIC RELEASE 60 MG PO TB24: 60.0000 mg | ORAL_TABLET | Freq: Every day | ORAL | Status: DC

## 2021-09-25 MED ORDER — INDAPAMIDE 1.25 MG PO TABS
2.5000 mg | ORAL_TABLET | Freq: Every day | ORAL | Status: DC
  Administered 2021-09-25 – 2021-09-27 (×3): 2.5 mg via ORAL
  Filled 2021-09-25 (×3): qty 2

## 2021-09-25 MED ORDER — FAMOTIDINE 20 MG PO TABS
40.0000 mg | ORAL_TABLET | Freq: Every day | ORAL | Status: DC
  Administered 2021-09-25 – 2021-09-27 (×3): 40 mg via ORAL

## 2021-09-25 MED ORDER — QUETIAPINE FUMARATE 25 MG PO TABS
12.5000 mg | ORAL_TABLET | Freq: Every evening | ORAL | Status: DC | PRN
  Administered 2021-09-25: 12.5 mg via ORAL

## 2021-09-25 MED ORDER — CYCLOSPORINE 0.05 % OP EMUL
1.0000 [drp] | Freq: Two times a day (BID) | OPHTHALMIC | Status: DC
  Administered 2021-09-25 – 2021-09-27 (×5): 1 [drp] via OPHTHALMIC
  Filled 2021-09-25 (×6): qty 30

## 2021-09-25 MED ORDER — NIFEDIPINE ER OSMOTIC RELEASE 60 MG PO TB24
60.0000 mg | ORAL_TABLET | Freq: Every day | ORAL | Status: DC
  Administered 2021-09-25 – 2021-09-27 (×3): 60 mg via ORAL
  Filled 2021-09-25 (×3): qty 1

## 2021-09-25 MED ORDER — HYDROXYZINE HCL 10 MG PO TABS
10.0000 mg | ORAL_TABLET | Freq: Three times a day (TID) | ORAL | Status: DC | PRN
  Filled 2021-09-25: qty 1

## 2021-09-25 MED ORDER — SUCRALFATE 1 GM/10ML PO SUSP
0.5000 g | Freq: Four times a day (QID) | ORAL | Status: DC
  Administered 2021-09-25 – 2021-09-27 (×7): 0.5 g via ORAL

## 2021-09-25 NOTE — Progress Notes (Signed)
Progress Note     Subjective: Doing okay with liquids.  Very confused - sister on phone says she is forgetful but this level of confusion is new.  Objective: Vital signs in last 24 hours: Temp:  [97.8 F (36.6 C)-98.6 F (37 C)] 98 F (36.7 C) (02/02 0940) Pulse Rate:  [80-93] 82 (02/02 0940) Resp:  [15-18] 16 (02/02 0940) BP: (180-212)/(79-100) 205/87 (02/02 0940) SpO2:  [98 %-100 %] 100 % (02/02 0940) Last BM Date: 09/24/21  Intake/Output from previous day: 02/01 0701 - 02/02 0700 In: 2028.8 [P.O.:1200; I.V.:828.8] Out: 3100 [Urine:3100] Intake/Output this shift: Total I/O In: 360 [P.O.:360] Out: -   PE: General: pleasant, WD, WN female who is laying in bed in NAD Lungs:  Respiratory effort nonlabored Abd: soft, NT, ND, +BS, no masses, hernias, or organomegaly Psych: A&Ox3 with an odd affect    Lab Results:  Recent Labs    09/24/21 0834 09/25/21 0758  WBC 4.8 6.0  HGB 10.0* 10.2*  HCT 31.4* 31.4*  PLT 241 258    BMET Recent Labs    09/24/21 0834 09/25/21 0758  NA 141 139  K 4.3 3.7  CL 108 110  CO2 24 23  GLUCOSE 76 149*  BUN 33* 19  CREATININE 2.27* 1.86*  CALCIUM 8.6* 8.7*    PT/INR No results for input(s): LABPROT, INR in the last 72 hours. CMP     Component Value Date/Time   NA 139 09/25/2021 0758   NA 138 07/25/2018 0000   K 3.7 09/25/2021 0758   CL 110 09/25/2021 0758   CO2 23 09/25/2021 0758   GLUCOSE 149 (H) 09/25/2021 0758   BUN 19 09/25/2021 0758   BUN 41 (A) 07/25/2018 0000   CREATININE 1.86 (H) 09/25/2021 0758   CALCIUM 8.7 (L) 09/25/2021 0758   PROT 6.6 09/23/2021 0843   ALBUMIN 3.2 (L) 09/23/2021 0843   AST 18 09/23/2021 0843   ALT 11 09/23/2021 0843   ALKPHOS 38 09/23/2021 0843   BILITOT 0.7 09/23/2021 0843   GFRNONAA 29 (L) 09/25/2021 0758   GFRAA 26 (L) 11/27/2018 0649   Lipase     Component Value Date/Time   LIPASE 39 09/22/2021 1757       Studies/Results: DG Abd Portable 1V-Small Bowel Obstruction  Protocol-24 hr delay  Result Date: 09/24/2021 CLINICAL DATA:  24 hour delayed small-bowel protocol. EXAM: PORTABLE ABDOMEN - 1 VIEW COMPARISON:  09/23/2021.  Abdomen and pelvis CT dated 09/22/2021. FINDINGS: The previously demonstrated small bowel contrast has progressed into normal caliber colon extending to the rectosigmoid region with gas in normal caliber rectum. Mildly dilated small bowel loops have not changed significantly except these no longer contain contrast. Unremarkable bones. IMPRESSION: Mild partial small bowel obstruction with interval passage of all of the administrated contrast into normal caliber colon. Electronically Signed   By: Claudie Revering M.D.   On: 09/24/2021 11:33   DG Abd Portable 1V-Small Bowel Obstruction Protocol-initial, 8 hr delay  Result Date: 09/23/2021 CLINICAL DATA:  8 hour delay small-bowel protocol. EXAM: PORTABLE ABDOMEN - 1 VIEW COMPARISON:  Abdominal x-ray 09/23/2021. FINDINGS: Dilated small bowel loops are again seen measuring up to 3.2 cm similar to prior examination. Contrast is seen throughout these dilated small bowel loops. No contrast is seen within the colon or nondilated small bowel in the pelvis. No suspicious calcifications. No acute fractures. IMPRESSION: 1. Contrast is seen within dilated small bowel in the mid abdomen. No contrast is seen within distal small bowel or colon.  Findings are compatible with small-bowel obstruction. Electronically Signed   By: Ronney Asters M.D.   On: 09/23/2021 19:43    Anti-infectives: Anti-infectives (From admission, onward)    None        Assessment/Plan  SBO - CT 1/30 with high grade SBO with transition point in distal jejunum or ileum - Having bowel movements, obstruction may be resolved - Slowly advance diet - Increase activity - confusion evaluation by medicine team - no indication for emergent surgical intervention but we will follow    FEN: NPO, IVF VTE: SQH ID: no current abx    NICM CAD Chronic combined systolic and diastolic CHF CKD stage IV Carotid stenosis HLD HTN T2DM GERD hysterectomy for hx of ovarian cancer   LOS: 2 days   I reviewed nursing notes, hospitalist notes, last 24 h vitals and pain scores, last 48 h intake and output, last 24 h labs and trends, and last 24 h imaging results.  This care required moderate level of medical decision making.    Felicie Morn, [facility] 09/25/2021, 12:12 PM Please see Amion for pager number during day hours 7:00am-4:30pm

## 2021-09-25 NOTE — Progress Notes (Signed)
PROGRESS NOTE    Teresa Poole  ZOX:[id number] DOB: 19-Jun-1951 DOA: 09/22/2021 PCP: [name], MD    Chief Complaint  Patient presents with   Abdominal Pain    Brief Narrative:  Patient 71 year old female history of type 2 diabetes, hypertension, hyperlipidemia, nonischemic cardiomyopathy, GERD, stage IV CKD, CHF, diastolic CHF, history of ovarian cancer and abdominal surgeries including hysterectomy presented to the ED with abdominal pain nausea vomiting.  CT abdomen and pelvis concerning for small bowel obstruction.  Patient placed on bowel rest, conservative treatment, NG tube placed which patient removed.  General surgery consulted and following   Assessment & Plan:   Principal Problem:   SBO (small bowel obstruction) (HCC) Active Problems:   Uncontrolled hypertension   Obesity (BMI 30-39.9)   Hypertensive heart disease with CHF (congestive heart failure) (HCC)   Hypertensive urgency   Stage 4 chronic kidney disease (HCC)   Chronic combined systolic and diastolic heart failure (HCC)   Mixed hyperlipidemia   Adjustment disorder with anxiety   Acute metabolic encephalopathy  #1 small bowel obstruction -Likely secondary to adhesions as patient with prior abdominal surgeries. -Patient presented nausea vomiting abdominal pain. -CT abdomen and pelvis done concerning for small bowel obstruction with transition point noted. -NG tube was placed but seems to have been dislodged. -Patient with some clinical improvement. -Patient being followed by general surgery and undergoing small bowel protocol with contrast noted in the colon. -Patient placed on clear liquids which she is tolerating and will advance to a full liquid diet.   -Saline lock IV fluids.  -Keep potassium approximately 4, magnesium > 2. -Per general surgery.  2.  Hypertensive urgency/hypertension -Systolic blood pressures noted in the 180s to low 200s per RN on 09/24/2021.  -Continue IV Lopressor, IV  enalapril.   -Resume home regimen indapamide and Procardia. -IV hydralazine as needed. -Could likely transition from IV Lopressor and IV enalapril back to home dose oral beta-blocker and ACE inhibitor tomorrow.  3.  History of nonischemic cardiomyopathy -Stable. -Noted to be on oral Lasix prior to admission and ACE inhibitor. -Saline lock IV fluids.   -Continue IV enalapril.   -Continue to hold Lasix.  Continue IV beta-blocker .  4.  Acute metabolic encephalopathy -Per RN patient with confusion yesterday and still with some confusion today although improving. -Patient alert to self place and time.  Knows who the president is. -Sister at bedside stating patient called in the middle of the night last night stating she did not know where she was however per sister patient with some improvement today in terms of her mental status. -CT head done 08/29/2021 unremarkable.  Patient with no focal neurological deficits. -Ammonia level within normal limits, TSH within normal limits, folic acid within normal limits, vitamin B12 levels within normal limits. -Place on Seroquel 12.5 mg nightly as needed. -Supportive care.  5.  Hyperlipidemia -Continue to hold Zetia until patient tolerating oral intake. -Could likely resume on discharge.  6.  Chronic combined systolic and diastolic heart failure -Stable. -Compensated. -Continue IV beta-blocker, IV enalapril. -If tolerating oral intake with diet advancement will transition to home regimen tomorrow. -Continue to hold Lasix. -Resume home regimen indapamide.  7.  CKD stage IV -Stable. -Monitor fluid status with hydration.   DVT prophylaxis: Heparin Code Status: Full Family Communication: Updated patient.  Updated sister at bedside.  Disposition:   Status is: Inpatient Remains inpatient appropriate because: Severity of illness  Planned Discharge Destination: Home  Consultants:  General surgery: Dr.Stechschulte  09/23/2021  Procedures:  CT abdomen and pelvis 09/22/2021 Small bowel protocol   Antimicrobials:  None   Subjective: Sitting up in chair.  Overall feeling better.  No nausea or vomiting.  No abdominal pain.  Tolerating clear liquids.  No bowel movement today.  Patient alert and oriented to self place and time.  Knows who the president is.  Knows who the prior president is.   Per RN patient with some bouts of confusion yesterday and some bouts of confusion today however slowly improving from yesterday.  States that bedside who states patient called her last night about 3-4 times in the middle of the night stating she was not sure where she was.   Objective: Vitals:   09/25/21 0940 09/25/21 1228 09/25/21 1236 09/25/21 1400  BP: (!) 205/87 (!) 192/88 (!) 190/80 (!) 185/80  Pulse: 82 88  90  Resp: 16 14  14   Temp: 98 F (36.7 C)   98.5 F (36.9 C)  TempSrc: Oral   Oral  SpO2: 100% 100%  99%  Weight:      Height:        Intake/Output Summary (Last 24 hours) at 09/25/2021 1614 Last data filed at 09/25/2021 1400 Gross per 24 hour  Intake 3311.69 ml  Output 2000 ml  Net 1311.69 ml   Filed Weights   09/22/21 1719  Weight: 70.3 kg    Examination:  General exam: NAD. Respiratory system: Lungs clear to auscultation bilaterally.  No wheezes, no crackles, no rhonchi.  Fair air movement.  Speaking in full sentences. Cardiovascular system: Regular rate rhythm no murmurs rubs or gallops.  No JVD.  No lower extremity edema. Gastrointestinal system: Abdomen is soft, nontender, nondistended, positive bowel sounds.  No rebound.  No guarding. Central nervous system: Alert and oriented to self place and time.. No focal neurological deficits.  Moving extremities spontaneously Extremities: Symmetric 5 x 5 power. Skin: No rashes, lesions or ulcers Psychiatry: Judgement and insight appear fair.. Mood & affect appropriate.     Data Reviewed: I have personally reviewed following labs and imaging  studies  CBC: Recent Labs  Lab 09/22/21 1757 09/23/21 0843 09/24/21 0834 09/25/21 0758  WBC 5.5 4.9 4.8 6.0  NEUTROABS 3.2  --  3.3 3.9  HGB 10.3* 10.0* 10.0* 10.2*  HCT 31.5* 31.9* 31.4* 31.4*  MCV 90.3 94.1 93.2 91.3  PLT 283 244 241 001    Basic Metabolic Panel: Recent Labs  Lab 09/22/21 1757 09/23/21 0843 09/24/21 0834 09/25/21 0758  NA 137 140 141 139  K 4.5 4.8 4.3 3.7  CL 101 105 108 110  CO2 25 25 24 23   GLUCOSE 139* 117* 76 149*  BUN 42* 39* 33* 19  CREATININE 2.70* 2.84* 2.27* 1.86*  CALCIUM 8.9 8.6* 8.6* 8.7*  MG  --  2.5* 2.1 1.7    GFR: Estimated Creatinine Clearance: 27.8 mL/min (A) (by C-G formula based on SCr of 1.86 mg/dL (H)).  Liver Function Tests: Recent Labs  Lab 09/22/21 1757 09/23/21 0843  AST 16 18  ALT 12 11  ALKPHOS 41 38  BILITOT 0.6 0.7  PROT 6.6 6.6  ALBUMIN 3.4* 3.2*    CBG: Recent Labs  Lab 09/24/21 1110 09/24/21 1623 09/24/21 2110 09/25/21 0744 09/25/21 1137  GLUCAP 94 98 140* 152* 155*     Recent Results (from the past 240 hour(s))  Resp Panel by RT-PCR (Flu A&B, Covid) Nasopharyngeal Swab     Status: None  Collection Time: 09/22/21 10:24 PM   Specimen: Nasopharyngeal Swab; Nasopharyngeal(NP) swabs in vial transport medium  Result Value Ref Range Status   SARS Coronavirus 2 by RT PCR NEGATIVE NEGATIVE Final    Comment: (NOTE) SARS-CoV-2 target nucleic acids are NOT DETECTED.  The SARS-CoV-2 RNA is generally detectable in upper respiratory specimens during the acute phase of infection. The lowest concentration of SARS-CoV-2 viral copies this assay can detect is 138 copies/mL. A negative result does not preclude SARS-Cov-2 infection and should not be used as the sole basis for treatment or other patient management decisions. A negative result may occur with  improper specimen collection/handling, submission of specimen other than nasopharyngeal swab, presence of viral mutation(s) within the areas targeted  by this assay, and inadequate number of viral copies(<138 copies/mL). A negative result must be combined with clinical observations, patient history, and epidemiological information. The expected result is Negative.  Fact Sheet for Patients:  EntrepreneurPulse.com.au  Fact Sheet for Healthcare Providers:  IncredibleEmployment.be  This test is no t yet approved or cleared by the [place] FDA and  has been authorized for detection and/or diagnosis of SARS-CoV-2 by FDA under an Emergency Use Authorization (EUA). This EUA will remain  in effect (meaning this test can be used) for the duration of the COVID-19 declaration under Section 564(b)(1) of the Act, 21 U.S.C.section 360bbb-3(b)(1), unless the authorization is terminated  or revoked sooner.       Influenza A by PCR NEGATIVE NEGATIVE Final   Influenza B by PCR NEGATIVE NEGATIVE Final    Comment: (NOTE) The Xpert Xpress SARS-CoV-2/FLU/RSV plus assay is intended as an aid in the diagnosis of influenza from Nasopharyngeal swab specimens and should not be used as a sole basis for treatment. Nasal washings and aspirates are unacceptable for Xpert Xpress SARS-CoV-2/FLU/RSV testing.  Fact Sheet for Patients: EntrepreneurPulse.com.au  Fact Sheet for Healthcare Providers: IncredibleEmployment.be  This test is not yet approved or cleared by the [place] FDA and has been authorized for detection and/or diagnosis of SARS-CoV-2 by FDA under an Emergency Use Authorization (EUA). This EUA will remain in effect (meaning this test can be used) for the duration of the COVID-19 declaration under Section 564(b)(1) of the Act, 21 U.S.C. section 360bbb-3(b)(1), unless the authorization is terminated or revoked.  Performed at [facility], [address] [place], [place] [address]   Urine Culture     Status: Abnormal (Preliminary result)   Collection  Time: 09/24/21  8:11 AM   Specimen: Urine, Catheterized  Result Value Ref Range Status   Specimen Description   Final    URINE, CATHETERIZED Performed at [facility], [place] [address], [place], [place] [address]    Special Requests   Final    NONE Performed at [facility], [place] [address] [place], [place] [address]    Culture (A)  Final    >=100,000 COLONIES/mL KLEBSIELLA PNEUMONIAE SUSCEPTIBILITIES TO FOLLOW Performed at [facility], [place] [address] [place], [place] [address]    Report Status PENDING  Incomplete         Radiology Studies: DG Abd Portable 1V-Small Bowel Obstruction Protocol-24 hr delay  Result Date: 09/24/2021 CLINICAL DATA:  24 hour delayed small-bowel protocol. EXAM: PORTABLE ABDOMEN - 1 VIEW COMPARISON:  09/23/2021.  Abdomen and pelvis CT dated 09/22/2021. FINDINGS: The previously demonstrated small bowel contrast has progressed into normal caliber colon extending to the rectosigmoid region with gas in normal caliber rectum. Mildly dilated  small bowel loops have not changed significantly except these no longer contain contrast. Unremarkable bones. IMPRESSION: Mild partial small bowel obstruction with interval passage of all of the administrated contrast into normal caliber colon. Electronically Signed   By: Claudie Revering M.D.   On: 09/24/2021 11:33   DG Abd Portable 1V-Small Bowel Obstruction Protocol-initial, 8 hr delay  Result Date: 09/23/2021 CLINICAL DATA:  8 hour delay small-bowel protocol. EXAM: PORTABLE ABDOMEN - 1 VIEW COMPARISON:  Abdominal x-ray 09/23/2021. FINDINGS: Dilated small bowel loops are again seen measuring up to 3.2 cm similar to prior examination. Contrast is seen throughout these dilated small bowel loops. No contrast is seen within the colon or nondilated small bowel in the pelvis. No suspicious calcifications. No acute fractures. IMPRESSION: 1. Contrast is seen within dilated small bowel in the mid  abdomen. No contrast is seen within distal small bowel or colon. Findings are compatible with small-bowel obstruction. Electronically Signed   By: Ronney Asters M.D.   On: 09/23/2021 19:43        Scheduled Meds:  cycloSPORINE  1 drop Both Eyes BID   docusate sodium  100 mg Oral BID   enalaprilat  1.25 mg Intravenous Q6H   famotidine  40 mg Oral Daily   heparin injection (subcutaneous)  5,000 Units Subcutaneous Q8H   indapamide  2.5 mg Oral Daily   insulin aspart  0-6 Units Subcutaneous TID WC   metoprolol tartrate  5 mg Intravenous Q6H   NIFEdipine  60 mg Oral Daily   pantoprazole  40 mg Oral Daily   pantoprazole (PROTONIX) IV  40 mg Intravenous Daily   sucralfate  0.5 g Oral Q6H   Continuous Infusions:  dextrose 5 % and 0.9% NaCl 85 mL/hr at 09/25/21 0602     LOS: 2 days    Time spent: 40 minutes    Irine Seal, MD [facility]   To contact the attending provider between 7A-7P or the covering provider during after hours 7P-7A, please log into the web site [email or web address] and access using universal [place] password for that web site. If you do not have the password, please call the hospital operator.  09/25/2021, 4:14 PM

## 2021-09-25 NOTE — Progress Notes (Signed)
Pt confused this pm, in the room pacing and cussing at staff. Pt pulled out her NSL. Attempted to reorient pt w/o success, MD notified

## 2021-09-26 LAB — BASIC METABOLIC PANEL
Anion gap: 8 (ref 5–15)
BUN: 17 mg/dL (ref 8–23)
CO2: 24 mmol/L (ref 22–32)
Calcium: 8.7 mg/dL — ABNORMAL LOW (ref 8.9–10.3)
Chloride: 109 mmol/L (ref 98–111)
Creatinine, Ser: 2.18 mg/dL — ABNORMAL HIGH (ref 0.44–1.00)
GFR, Estimated: 24 mL/min — ABNORMAL LOW (ref >60)
Glucose, Bld: 91 mg/dL (ref 70–99)
Potassium: 3.9 mmol/L (ref 3.5–5.1)
Sodium: 141 mmol/L (ref 135–145)

## 2021-09-26 LAB — URINE CULTURE: Culture: >=100000 — AB

## 2021-09-26 LAB — GLUCOSE, CAPILLARY
Glucose-Capillary: 80 mg/dL (ref 70–99)
Glucose-Capillary: 156 mg/dL — ABNORMAL HIGH (ref 70–99)
Glucose-Capillary: 90 mg/dL (ref 70–99)
Glucose-Capillary: 110 mg/dL — ABNORMAL HIGH (ref 70–99)

## 2021-09-26 LAB — MAGNESIUM: Magnesium: 1.7 mg/dL (ref 1.7–2.4)

## 2021-09-26 LAB — RPR: RPR Ser Ql: NONREACTIVE

## 2021-09-26 MED ORDER — CARVEDILOL 25 MG PO TABS
25.0000 mg | ORAL_TABLET | Freq: Two times a day (BID) | ORAL | Status: DC
  Administered 2021-09-26 – 2021-09-27 (×3): 25 mg via ORAL

## 2021-09-26 MED ORDER — EZETIMIBE 10 MG PO TABS
10.0000 mg | ORAL_TABLET | Freq: Every day | ORAL | Status: DC
  Administered 2021-09-26 – 2021-09-27 (×2): 10 mg via ORAL

## 2021-09-26 MED ORDER — POLYETHYLENE GLYCOL 3350 17 G PO PACK
17.0000 g | PACK | Freq: Every day | ORAL | Status: DC
  Administered 2021-09-26: 17 g via ORAL

## 2021-09-26 MED ORDER — QUETIAPINE FUMARATE 25 MG PO TABS: 12.5000 mg | ORAL_TABLET | Freq: Once | ORAL | Status: DC

## 2021-09-26 MED ORDER — LISINOPRIL 20 MG PO TABS
40.0000 mg | ORAL_TABLET | Freq: Every day | ORAL | Status: DC
  Administered 2021-09-26 – 2021-09-27 (×2): 40 mg via ORAL

## 2021-09-26 MED ORDER — OLANZAPINE 5 MG PO TBDP
5.0000 mg | ORAL_TABLET | Freq: Every day | ORAL | Status: DC
  Administered 2021-09-26 – 2021-09-27 (×2): 5 mg via ORAL
  Filled 2021-09-26 (×3): qty 1

## 2021-09-26 MED ORDER — ACETAMINOPHEN 325 MG PO TABS: 650.0000 mg | ORAL_TABLET | Freq: Four times a day (QID) | ORAL | Status: DC | PRN

## 2021-09-26 NOTE — Progress Notes (Signed)
Patient became aggressive with staff and began throwing items and water at staff. Items removed from room for safety of patient and staff. Sitter at bedside for monitoring. Will continue to assess this patient.

## 2021-09-26 NOTE — Progress Notes (Signed)
PROGRESS NOTE    Teresa Poole  KZL:[id number] DOB: [birth year] DOA: 09/22/2021 PCP: [name], MD    Chief Complaint  Patient presents with   Abdominal Pain    Brief Narrative:  Patient 72 year old female history of type 2 diabetes, hypertension, hyperlipidemia, nonischemic cardiomyopathy, GERD, stage IV CKD, CHF, diastolic CHF, history of ovarian cancer and abdominal surgeries including hysterectomy presented to the ED with abdominal pain nausea vomiting.  CT abdomen and pelvis concerning for small bowel obstruction.  Patient placed on bowel rest, conservative treatment, NG tube placed which patient removed.  General surgery consulted and following   Assessment & Plan:   Principal Problem:   SBO (small bowel obstruction) (HCC) Active Problems:   Uncontrolled hypertension   Obesity (BMI 30-39.9)   Hypertensive heart disease with CHF (congestive heart failure) (HCC)   Hypertensive urgency   Stage 4 chronic kidney disease (HCC)   Chronic combined systolic and diastolic heart failure (HCC)   Mixed hyperlipidemia   Adjustment disorder with anxiety   Acute metabolic encephalopathy  #1 small bowel obstruction -Likely secondary to adhesions as patient with prior abdominal surgeries. -Patient presented nausea vomiting abdominal pain. -CT abdomen and pelvis done concerning for small bowel obstruction with transition point noted. -NG tube was placed but seems to have been dislodged. -Patient with some clinical improvement. -Patient being followed by general surgery and undergoing small bowel protocol with contrast noted in the colon. -Patient placed on clear liquids which she tolerated, was advanced to full liquid diet which she tolerated and diet has been advanced to soft diet per general surgery.  -Saline lock IV fluids.  -Keep potassium approximately 4, magnesium > 2. -Per general surgery.  2.  Hypertensive urgency/hypertension -Systolic blood pressures noted in the  180s to low 200s per RN on 09/24/2021.  -Blood pressure improving.   -Discontinue IV Lopressor, IV enalapril and placed back on home regimen of oral beta-blocker, ACE inhibitor.   -Continue indapamide and Procardia.   -IV hydralazine as needed.    3.  History of nonischemic cardiomyopathy -Stable. -Noted to be on oral Lasix prior to admission and ACE inhibitor. -Change IV enalapril back to home dose lisinopril.   -Change IV beta-blocker to home dose oral beta-blocker.   -Continue to hold Lasix.   4.  Acute metabolic encephalopathy -Per RN patient with confusion yesterday evening and some bouts of confusion early on this morning which has improved.  -Patient likely sundowning.  -Patient alert to self place and time.  Knows who the president is. -Sister at bedside stating patient called in the middle of the night last night stating she did not know where she was however per sister patient with some improvement today in terms of her mental status. -CT head done 08/29/2021 unremarkable.  Patient with no focal neurological deficits. -Ammonia level within normal limits, TSH within normal limits, folic acid within normal limits, vitamin B12 levels within normal limits. -Seroquel 12.5 mg nightly as needed ordered last night.   -Patient refusing Seroquel today.   -Place on Zyprexa 5 mg ODT daily. -Supportive care.  5.  Hyperlipidemia -Continue to hold Zetia until patient tolerating oral intake. -Could likely resume on discharge.  6.  Chronic combined systolic and diastolic heart failure -Stable. -Compensated. -Transition from IV beta-blocker to home dose oral beta-blocker and transition from IV enalapril to home dose lisinopril.   -Continue to hold Lasix.   -Continue indapamide.   7.  CKD stage IV -Stable.   DVT prophylaxis: Heparin  Code Status: Full Family Communication: Updated patient.  No family at bedside.  Disposition:   Status is: Inpatient Remains inpatient appropriate  because: Severity of illness  Planned Discharge Destination: Home         Consultants:  General surgery: Dr.Stechschulte 09/23/2021  Procedures:  CT abdomen and pelvis 09/22/2021 Small bowel protocol   Antimicrobials:  None   Subjective: Sitting up in chair.  Overall feeling better.  No nausea or vomiting.  No abdominal pain.  Tolerating clear liquids.  No bowel movement today.  Patient alert and oriented to self place and time.  Knows who the president is.  Knows who the prior president is.   Patient noted to have some agitation yesterday evening.  Likely sundowning.  Objective: Vitals:   09/25/21 2107 09/26/21 0205 09/26/21 0557 09/26/21 0909  BP: (!) 170/86 139/70 (!) 143/80 (!) 158/76  Pulse: 81 83 67 78  Resp: 15 18 15    Temp: 98.1 F (36.7 C) 97.7 F (36.5 C) 97.7 F (36.5 C) 98 F (36.7 C)  TempSrc: Oral Oral Oral Oral  SpO2: 100% 100% 100% 100%  Weight:      Height:        Intake/Output Summary (Last 24 hours) at 09/26/2021 1317 Last data filed at 09/26/2021 1000 Gross per 24 hour  Intake 1577.82 ml  Output 500 ml  Net 1077.82 ml    Filed Weights   09/22/21 1719  Weight: 70.3 kg    Examination:  General exam: NAD. Respiratory system: CTA B.  No wheezes, no crackles, no rhonchi.  Fair air movement.  Cardiovascular system: RRR no murmurs rubs or gallops.  No JVD.  No lower extremity edema.  Gastrointestinal system: Abdomen is soft, nontender, nondistended, positive bowel sounds.  No rebound.  No guarding.  Central nervous system: Alert and oriented to self place and time.. No focal neurological deficits.  Moving extremities spontaneously Extremities: Symmetric 5 x 5 power. Skin: No rashes, lesions or ulcers Psychiatry: Judgement and insight appear fair.. Mood & affect appropriate.     Data Reviewed: I have personally reviewed following labs and imaging studies  CBC: Recent Labs  Lab 09/22/21 1757 09/23/21 0843 09/24/21 0834 09/25/21 0758   WBC 5.5 4.9 4.8 6.0  NEUTROABS 3.2  --  3.3 3.9  HGB 10.3* 10.0* 10.0* 10.2*  HCT 31.5* 31.9* 31.4* 31.4*  MCV 90.3 94.1 93.2 91.3  PLT 283 244 241 258     Basic Metabolic Panel: Recent Labs  Lab 09/22/21 1757 09/23/21 0843 09/24/21 0834 09/25/21 0758 09/26/21 0414  NA 137 140 141 139 141  K 4.5 4.8 4.3 3.7 3.9  CL 101 105 108 110 109  CO2 25 25 24 23 24   GLUCOSE 139* 117* 76 149* 91  BUN 42* 39* 33* 19 17  CREATININE 2.70* 2.84* 2.27* 1.86* 2.18*  CALCIUM 8.9 8.6* 8.6* 8.7* 8.7*  MG  --  2.5* 2.1 1.7 1.7     GFR: Estimated Creatinine Clearance: 23.7 mL/min (A) (by C-G formula based on SCr of 2.18 mg/dL (H)).  Liver Function Tests: Recent Labs  Lab 09/22/21 1757 09/23/21 0843  AST 16 18  ALT 12 11  ALKPHOS 41 38  BILITOT 0.6 0.7  PROT 6.6 6.6  ALBUMIN 3.4* 3.2*     CBG: Recent Labs  Lab 09/25/21 1137 09/25/21 1618 09/25/21 2104 09/26/21 0734 09/26/21 1142  GLUCAP 155* 159* 173* 80 156*      Recent Results (from the past 240 hour(s))  Resp Panel  by RT-PCR (Flu A&B, Covid) Nasopharyngeal Swab     Status: None   Collection Time: 09/22/21 10:24 PM   Specimen: Nasopharyngeal Swab; Nasopharyngeal(NP) swabs in vial transport medium  Result Value Ref Range Status   SARS Coronavirus 2 by RT PCR NEGATIVE NEGATIVE Final    Comment: (NOTE) SARS-CoV-2 target nucleic acids are NOT DETECTED.  The SARS-CoV-2 RNA is generally detectable in upper respiratory specimens during the acute phase of infection. The lowest concentration of SARS-CoV-2 viral copies this assay can detect is 138 copies/mL. A negative result does not preclude SARS-Cov-2 infection and should not be used as the sole basis for treatment or other patient management decisions. A negative result may occur with  improper specimen collection/handling, submission of specimen other than nasopharyngeal swab, presence of viral mutation(s) within the areas targeted by this assay, and inadequate  number of viral copies(<138 copies/mL). A negative result must be combined with clinical observations, patient history, and epidemiological information. The expected result is Negative.  Fact Sheet for Patients:  EntrepreneurPulse.com.au  Fact Sheet for Healthcare Providers:  IncredibleEmployment.be  This test is no t yet approved or cleared by the [place] FDA and  has been authorized for detection and/or diagnosis of SARS-CoV-2 by FDA under an Emergency Use Authorization (EUA). This EUA will remain  in effect (meaning this test can be used) for the duration of the COVID-19 declaration under Section 564(b)(1) of the Act, 21 U.S.C.section 360bbb-3(b)(1), unless the authorization is terminated  or revoked sooner.       Influenza A by PCR NEGATIVE NEGATIVE Final   Influenza B by PCR NEGATIVE NEGATIVE Final    Comment: (NOTE) The Xpert Xpress SARS-CoV-2/FLU/RSV plus assay is intended as an aid in the diagnosis of influenza from Nasopharyngeal swab specimens and should not be used as a sole basis for treatment. Nasal washings and aspirates are unacceptable for Xpert Xpress SARS-CoV-2/FLU/RSV testing.  Fact Sheet for Patients: EntrepreneurPulse.com.au  Fact Sheet for Healthcare Providers: IncredibleEmployment.be  This test is not yet approved or cleared by the [place] FDA and has been authorized for detection and/or diagnosis of SARS-CoV-2 by FDA under an Emergency Use Authorization (EUA). This EUA will remain in effect (meaning this test can be used) for the duration of the COVID-19 declaration under Section 564(b)(1) of the Act, 21 U.S.C. section 360bbb-3(b)(1), unless the authorization is terminated or revoked.  Performed at [facility], [address] [place], [place] [address]   Urine Culture     Status: Abnormal   Collection Time: 09/24/21  8:11 AM   Specimen: Urine,  Catheterized  Result Value Ref Range Status   Specimen Description   Final    URINE, CATHETERIZED Performed at [facility] [address] [place], [place] [address]    Special Requests   Final    NONE Performed at [facility], [place] [address] [place], [place] [address]    Culture >=100,000 COLONIES/mL KLEBSIELLA PNEUMONIAE (A)  Final   Report Status 09/26/2021 FINAL  Final   Organism ID, Bacteria KLEBSIELLA PNEUMONIAE (A)  Final      Susceptibility   Klebsiella pneumoniae - MIC*    AMPICILLIN >=32 RESISTANT Resistant     CEFAZOLIN <=4 SENSITIVE Sensitive     CEFEPIME <=0.12 SENSITIVE Sensitive     CEFTRIAXONE <=0.25 SENSITIVE Sensitive     CIPROFLOXACIN <=0.25 SENSITIVE Sensitive     GENTAMICIN <=1 SENSITIVE Sensitive     IMIPENEM <=0.25 SENSITIVE Sensitive  NITROFURANTOIN <=16 SENSITIVE Sensitive     TRIMETH/SULFA <=20 SENSITIVE Sensitive     AMPICILLIN/SULBACTAM 4 SENSITIVE Sensitive     PIP/TAZO <=4 SENSITIVE Sensitive     * >=100,000 COLONIES/mL KLEBSIELLA PNEUMONIAE          Radiology Studies: No results found.      Scheduled Meds:  carvedilol  25 mg Oral BID WC   cycloSPORINE  1 drop Both Eyes BID   docusate sodium  100 mg Oral BID   ezetimibe  10 mg Oral Daily   famotidine  40 mg Oral Daily   heparin injection (subcutaneous)  5,000 Units Subcutaneous Q8H   indapamide  2.5 mg Oral Daily   insulin aspart  0-6 Units Subcutaneous TID WC   lisinopril  40 mg Oral Daily   NIFEdipine  60 mg Oral Daily   pantoprazole  40 mg Oral Daily   polyethylene glycol  17 g Oral Daily   sucralfate  0.5 g Oral Q6H   Continuous Infusions:     LOS: 3 days    Time spent: 40 minutes    Irine Seal, MD [facility]   To contact the attending provider between 7A-7P or the covering provider during after hours 7P-7A, please log into the web site [email or web address] and access using universal [place] password for that  web site. If you do not have the password, please call the hospital operator.  09/26/2021, 1:17 PM

## 2021-09-26 NOTE — Progress Notes (Signed)
Patient extremely agitated, has called 911 multiple times, stating that we were trying to murder her.  Has thrown water pitcher on staff and removed plug from the computer in the room.  MD made aware, with orders to give Serouquel PO, patient refuses, Patient also refuses to take Zyprexa PO, Sister notified and is en route to facility to sit with patient, patient currently on 1:1 monitoring

## 2021-09-26 NOTE — Progress Notes (Signed)
Progress Note     Subjective: Pt reports "sulfuric acid in GI tract and all the way to my tears". She is upset this AM because she was given all her morning meds at once and she is convinced that she might go into cardiac arrest from this. She has little memory over the last 3 days. She seems to be tolerating FLD, she reports wanting to vomit but on further questioning this seems more related to wanting to vomit the meds she was given this AM.   Objective: Vital signs in last 24 hours: Temp:  [97.7 F (36.5 C)-98.5 F (36.9 C)] 97.7 F (36.5 C) (02/03 0557) Pulse Rate:  [67-90] 78 (02/03 0909) Resp:  [14-18] 15 (02/03 0557) BP: (139-192)/(70-88) 158/76 (02/03 0909) SpO2:  [99 %-100 %] 100 % (02/03 0909) Last BM Date: 09/24/21  Intake/Output from previous day: 02/02 0701 - 02/03 0700 In: 2772.8 [P.O.:1960; I.V.:812.8] Out: 500 [Urine:500] Intake/Output this shift: No intake/output data recorded.  PE: General: WD, WN female who is sitting up in bed  CV: RRR Lungs:  Respiratory effort nonlabored Abd: soft, NT, ND, +BS, no masses, hernias, or organomegaly Psych: patient is confused and is adamant that her being given all her meds at once this AM is going to sent her into cardiac arrest    Lab Results:  Recent Labs    09/24/21 0834 09/25/21 0758  WBC 4.8 6.0  HGB 10.0* 10.2*  HCT 31.4* 31.4*  PLT 241 258   BMET Recent Labs    09/25/21 0758 09/26/21 0414  NA 139 141  K 3.7 3.9  CL 110 109  CO2 23 24  GLUCOSE 149* 91  BUN 19 17  CREATININE 1.86* 2.18*  CALCIUM 8.7* 8.7*   PT/INR No results for input(s): LABPROT, INR in the last 72 hours. CMP     Component Value Date/Time   NA 141 09/26/2021 0414   NA 138 07/25/2018 0000   K 3.9 09/26/2021 0414   CL 109 09/26/2021 0414   CO2 24 09/26/2021 0414   GLUCOSE 91 09/26/2021 0414   BUN 17 09/26/2021 0414   BUN 41 (A) 07/25/2018 0000   CREATININE 2.18 (H) 09/26/2021 0414   CALCIUM 8.7 (L) 09/26/2021 0414    PROT 6.6 09/23/2021 0843   ALBUMIN 3.2 (L) 09/23/2021 0843   AST 18 09/23/2021 0843   ALT 11 09/23/2021 0843   ALKPHOS 38 09/23/2021 0843   BILITOT 0.7 09/23/2021 0843   GFRNONAA 24 (L) 09/26/2021 0414   GFRAA 26 (L) 11/27/2018 0649   Lipase     Component Value Date/Time   LIPASE 39 09/22/2021 1757       Studies/Results: DG Abd Portable 1V-Small Bowel Obstruction Protocol-24 hr delay  Result Date: 09/24/2021 CLINICAL DATA:  24 hour delayed small-bowel protocol. EXAM: PORTABLE ABDOMEN - 1 VIEW COMPARISON:  09/23/2021.  Abdomen and pelvis CT dated 09/22/2021. FINDINGS: The previously demonstrated small bowel contrast has progressed into normal caliber colon extending to the rectosigmoid region with gas in normal caliber rectum. Mildly dilated small bowel loops have not changed significantly except these no longer contain contrast. Unremarkable bones. IMPRESSION: Mild partial small bowel obstruction with interval passage of all of the administrated contrast into normal caliber colon. Electronically Signed   By: Claudie Revering M.D.   On: 09/24/2021 11:33    Anti-infectives: Anti-infectives (From admission, onward)    None        Assessment/Plan  SBO - CT 1/30 with high grade SBO with  transition point in distal jejunum or ileum - patient is tolerating FLD and abdominal exam remains benign  - adv diet to soft this AM, added miralax to bowel regimen - Increase activity - confusion evaluation by medicine team - no indication for emergent surgical intervention at this time, we can re-evaluate if she does not tolerate diet advancement   FEN: SOFT VTE: SQH ID: no current abx   Acute delirium/confusion  NICM CAD Chronic combined systolic and diastolic CHF CKD stage IV Carotid stenosis HLD HTN T2DM GERD hysterectomy for hx of ovarian cancer   LOS: 2 days   LOS: 3 days   I reviewed hospitalist notes, last 24 h vitals and pain scores, last 48 h intake and output, and last 24  h labs and trends.  This care required low level of medical decision making.    Norm Parcel, [facility] Surgery 09/26/2021, 9:50 AM Please see Amion for pager number during day hours 7:00am-4:30pm

## 2021-09-27 DIAGNOSIS — F4322 Adjustment disorder with anxiety: Secondary | ICD-10-CM

## 2021-09-27 DIAGNOSIS — N184 Chronic kidney disease, stage 4 (severe): Secondary | ICD-10-CM

## 2021-09-27 DIAGNOSIS — I10 Essential (primary) hypertension: Secondary | ICD-10-CM

## 2021-09-27 DIAGNOSIS — K56609 Unspecified intestinal obstruction, unspecified as to partial versus complete obstruction: Secondary | ICD-10-CM

## 2021-09-27 LAB — GLUCOSE, CAPILLARY
Glucose-Capillary: 70 mg/dL (ref 70–99)
Glucose-Capillary: 101 mg/dL — ABNORMAL HIGH (ref 70–99)

## 2021-09-27 MED ORDER — OLANZAPINE 5 MG PO TBDP: 5.0000 mg | ORAL_TABLET | Freq: Every day | ORAL | 1 refills | Status: DC

## 2021-09-27 MED ORDER — POLYETHYLENE GLYCOL 3350 17 G PO PACK: 17.0000 g | PACK | Freq: Every day | ORAL | 0 refills | Status: DC

## 2021-09-27 NOTE — Discharge Summary (Signed)
Physician Discharge Summary  Teresa Poole WIO:[id number] DOB: February 27, 1951 DOA: 09/22/2021  PCP: [name], MD  Admit date: 09/22/2021 Discharge date: 09/27/2021  Time spent: 55 minutes  Recommendations for Outpatient Follow-up:  Follow-up with [name], MD in 1 to 2 weeks.  On follow-up patient will need a basic metabolic profile, magnesium level checked to follow-up on electrolytes and renal function.  Patient sundowning will need to be reassessed on follow-up.   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 Madelyn Brunner Teresa Poole is a 71 y.o. female with medical history significant of type 2 diabetes, hypertension, hyperlipidemia, nonischemic cardiomyopathy, GERD, stage IV CKD, CHF, diastolic heart failure, history of ovarian cancer and abdominal surgery including hysterectomy admitted with abdominal pain nausea and vomiting. She reports that she has been burping a lot but she has not passed any gas.  She has not had a BM today.  She thinks her last bowel movement was last night. She denies any fever or chills.  She reports that she woke up at 3 in the morning and vomited.  She has not vomited in 45 years. She feels like she has sulfuric acid coming from her GERD and is burning all her skin.  And reports she has had lesions on her skin which has been healed.  She denies any dysuria. No fever  chills. Denies chest pain shortness of breath or palpitations. She lives alone.   ED Course: Patient was transferred from [facility].  She was given Zofran, Coreg, Pepcid, Maalox, and started on IV fluids. CT abdomen 09/22/2021 with high-grade small bowel obstruction with transition point in the distal jejunum or ileum.  Hospital Course:  #1 small bowel obstruction -Likely secondary to adhesions as patient with prior abdominal surgeries. -Patient presented nausea vomiting abdominal pain. -CT abdomen and pelvis done concerning for small bowel obstruction with transition point noted. -NG tube was placed but subsequently dislodged. -Patient improved clinically during the hospitalization -Patient being followed by general surgery and undergoing small bowel protocol with contrast noted in the colon. -Patient placed on clear liquids which she tolerated, was advanced to full liquid diet which she tolerated and diet has been advanced to soft diet which she tolerated.  -Electrolytes were repleted.   -Patient was discharged home in stable and improved condition.   -Outpatient follow-up with PCP.    2.  Hypertensive urgency/hypertension -Systolic blood pressures noted in the 180s to low 200s per RN on 09/24/2021.  -Patient initially placed on IV beta-blocker, IV enalapril as patient on presentation was NPO.   -As patient improved clinically and diet advanced patient placed back on home regimen of beta-blocker, ACE inhibitor, Procardia, indapamide with resolution of hypertensive urgency.   -Patient will be discharged home in stable and improved condition.   -Outpatient follow-up with PCP.    3.  History of nonischemic cardiomyopathy -Stable. -Noted to be on oral Lasix prior to  admission and ACE inhibitor. -Due to n.p.o. status patient was placed on IV enalapril and IV beta-blocker early on during the hospitalization and as patient improved clinically IV enalapril and IV beta-blocker were  changed back to home regimen of oral beta-blocker and ACE inhibitor.   -Patient's diuretics were held during the hospitalization and will be resumed on discharge.   4.  Acute metabolic encephalopathy -Per RN patient with confusion during the hospitalization felt likely secondary to sundowning.  -Patient alert to self place and time.  Knows who the president is. -Sister at bedside stated patient called in the middle of the night, she did not know where she was however per sister patient with some improvement in terms of her mental status. -CT head done 08/29/2021 unremarkable.  Patient with no focal neurological deficits. -Ammonia level within normal limits, TSH within normal limits, folic acid within normal limits, vitamin B12 levels within normal limits. -Patient with no signs or symptoms of infection. -Seroquel ordered however patient refused and patient transition to Zyprexa 5 mg ODT daily which patient will be discharged home on. -Outpatient follow-up with PCP.  5.  Hyperlipidemia -Patient CHF was held during the hospitalization and will be resumed on discharge.   6.  Chronic combined systolic and diastolic heart failure -Stable. -Compensated. -Patient initially placed on IV beta-blocker and IV enalapril on presentation as patient was n.p.o. due to SBO.   -As patient improved clinically patient was transition back to home regimen of oral beta-blocker and ACE inhibitor.   -Patient's indapamide resumed during the hospitalization, Lasix held.   -Lasix will be resumed on discharge.    7.  CKD stage IV -Remained stable.    Procedures: CT abdomen and pelvis 09/22/2021 Small bowel protocol    Consultations: General surgery: Dr.Stechschulte 09/23/2021    Discharge Exam: Vitals:   09/26/21 2130 09/27/21 0557  BP: (!) 152/76 130/63  Pulse: 89 81  Resp: 16 16  Temp: 98.6 F (37 C) 98.3 F (36.8 C)  SpO2: 100% 99%    General: NAD Cardiovascular: RRR no murmurs rubs or gallops.   No JVD.  No lower extremity edema. Respiratory: CTA B.  No wheezes, no crackles, no rhonchi.  Normal respiratory effort.  Discharge Instructions   Discharge Instructions     Diet Carb Modified   Complete by: As directed    Increase activity slowly   Complete by: As directed       Allergies as of 09/27/2021       Reactions   Hydralazine Hcl Itching   ENTIRE BODY = burning sensation, also in the eyes (they have become very sensitive to light)   Atorvastatin Other (See Comments)   Per MD - pt not sure of reaction         Medication List     TAKE these medications    carvedilol 25 MG tablet Commonly known as: COREG Take 25 mg by mouth 2 (two) times daily with a meal.   Cequa 0.09 % Soln Generic drug: cycloSPORINE (PF) Place 1 drop into both eyes in the morning and at bedtime.   ezetimibe 10 MG tablet Commonly known as: ZETIA Take 10 mg by mouth daily.   famotidine 40 MG tablet Commonly known as: PEPCID Take 40 mg by mouth daily.   furosemide 20 MG tablet Commonly known as: LASIX Take 20 mg by mouth daily.   glucose blood test strip Commonly known as: ONE TOUCH ULTRA TEST Use to check blood sugar 3 times daily Dx  code E11.65 What changed:  how much to take how to take this when to take this   OneTouch Verio test strip Generic drug: glucose blood CHECK BLOOD SUGAR THREE TIMES DAILY What changed: See the new instructions.   hydrOXYzine 10 MG tablet Commonly known as: ATARAX Take 10 mg by mouth 3 (three) times daily as needed for anxiety.   indapamide 2.5 MG tablet Commonly known as: LOZOL Take 2.5 mg by mouth daily.   lisinopril 40 MG tablet Commonly known as: ZESTRIL Take 1 tablet (40 mg total) by mouth daily.   NIFEdipine 60 MG 24 hr tablet Commonly known as: ADALAT CC Take 1 tablet (60 mg total) by mouth daily.   OLANZapine zydis 5 MG disintegrating tablet Commonly known as: ZYPREXA Take 1 tablet (5 mg total) by mouth daily. Start taking  on: September 28, 2021   omeprazole 40 MG capsule Commonly known as: PRILOSEC Take 40 mg by mouth daily.   Ozempic (0.25 or 0.5 MG/DOSE) 2 MG/1.5ML Sopn Generic drug: Semaglutide(0.25 or 0.5MG /DOS) Inject 0.25 mg into the skin once a week. Sundays   polyethylene glycol 17 g packet Commonly known as: MIRALAX / GLYCOLAX Take 17 g by mouth daily. Start taking on: September 28, 2021   sucralfate 1 GM/10ML suspension Commonly known as: CARAFATE Take 0.5 g by mouth in the morning, at noon, in the evening, and at bedtime. What changed: Another medication with the same name was removed. Continue taking this medication, and follow the directions you see here.       Allergies  Allergen Reactions   Hydralazine Hcl Itching    ENTIRE BODY = burning sensation, also in the eyes (they have become very sensitive to light)   Atorvastatin Other (See Comments)    Per MD - pt not sure of reaction     Follow-up Information     [name], MD. Schedule an appointment as soon as possible for a visit in 2 week(s).   Specialty: Internal Medicine Contact information: [address] [place] [address] [phone]         Jettie Booze, MD .   Specialties: Cardiology, Radiology, Interventional Cardiology Contact information: [address] [place] [address] [phone]                  The results of significant diagnostics from this hospitalization (including imaging, microbiology, ancillary and laboratory) are listed below for reference.    Significant Diagnostic Studies: CT ABDOMEN PELVIS WO CONTRAST  Result Date: 09/22/2021 CLINICAL DATA:  Left lower quadrant abdominal pain, nausea and vomiting since 5 a.m. EXAM: CT ABDOMEN AND PELVIS WITHOUT CONTRAST TECHNIQUE: Multidetector CT imaging of the abdomen and pelvis was performed following the standard protocol without IV contrast. RADIATION DOSE REDUCTION: This exam was  performed according to the departmental dose-optimization program which includes automated exposure control, adjustment of the mA and/or kV according to patient size and/or use of iterative reconstruction technique. COMPARISON:  None. FINDINGS: Lower chest: No acute pleural or parenchymal lung disease. Hepatobiliary: Unremarkable unenhanced appearance of the liver and gallbladder. Pancreas: Unremarkable unenhanced appearance. Spleen: Unremarkable unenhanced appearance. Adrenals/Urinary Tract: Bilateral renal atrophy consistent with chronic renal insufficiency. 1.4 cm right renal cyst. No urinary tract calculi or obstructive uropathy within either kidney. The adrenals and bladder are unremarkable. Stomach/Bowel: Marked gastric distension, with dilation of the small bowel measuring up to 3.5 cm and multiple small bowel gas fluid levels consistent with high-grade small bowel  obstruction. Decompressed loops of distal jejunum and ileum are seen within the pelvis and right lower quadrant. Exact transition point is difficult to visualized. The colon is decompressed. No bowel wall thickening or inflammatory change. Vascular/Lymphatic: Diffuse atherosclerosis. Evaluation of the vascular lumen is limited without IV contrast. No pathologic adenopathy. Reproductive: Status post hysterectomy. No adnexal masses. Other: No free fluid or free gas. Pelvic floor laxity with rectal and bladder prolapse. Please correlate with physical exam findings. No abdominal wall hernia. Musculoskeletal: No acute or destructive bony lesions. Reconstructed images demonstrate no additional findings. IMPRESSION: 1. High-grade small bowel obstruction, transition point within the distal jejunum or ileum. 2. No urinary tract calculi or obstructive uropathy. 3. Pelvic floor laxity, with rectal and bladder prolapse. 4.  Aortic Atherosclerosis (ICD10-I70.0). Electronically Signed   By: Randa Ngo M.D.   On: 09/22/2021 22:06   DG Chest 2 View  Result  Date: 09/09/2021 CLINICAL DATA:  71 year old female with central chest pressure, pain radiating to the right shoulder and arm. EXAM: CHEST - 2 VIEW COMPARISON:  Chest radiographs 08/28/2021 and earlier. FINDINGS: Lung volumes and mediastinal contours remain normal. Calcified aortic atherosclerosis. Visualized tracheal air column is within normal limits. Calcified right subclavian/axillary atherosclerosis. Both lungs appear stable and clear. No pneumothorax or pleural effusion. No acute osseous abnormality identified. Negative visible bowel gas. IMPRESSION: No acute cardiopulmonary abnormality. Aortic Atherosclerosis (ICD10-I70.0). Electronically Signed   By: Genevie Ann M.D.   On: 09/09/2021 10:04   DG Abd 1 View  Result Date: 09/23/2021 CLINICAL DATA:  NG tube placement. EXAM: ABDOMEN - 1 VIEW COMPARISON:  None. FINDINGS: NG tube tip is positioned in the lateral stomach. Proximal side port is below the GE junction. There is some persistent mild diffuse gaseous bowel distension. IMPRESSION: NG tube tip is in the stomach with proximal side port below the GE junction. Electronically Signed   By: Misty Stanley M.D.   On: 09/23/2021 05:36   CT Head Wo Contrast  Result Date: 08/29/2021 CLINICAL DATA:  Right-sided paresthesias EXAM: CT HEAD WITHOUT CONTRAST TECHNIQUE: Contiguous axial images were obtained from the base of the skull through the vertex without intravenous contrast. COMPARISON:  08/01/2021 FINDINGS: Brain: No evidence of acute infarction, hemorrhage, hydrocephalus, extra-axial collection or mass lesion/mass effect. Vascular: No hyperdense vessel or unexpected calcification. Skull: Normal. Negative for fracture or focal lesion. Sinuses/Orbits: No acute finding. Other: None. IMPRESSION: Normal head CT without contrast. Electronically Signed   By: Jerilynn Mages.  Shick M.D.   On: 08/29/2021 10:50   MR BRAIN WO CONTRAST  Result Date: 08/29/2021 CLINICAL DATA:  Numbness on right side of body EXAM: MRI HEAD WITHOUT  CONTRAST TECHNIQUE: Multiplanar, multiecho pulse sequences of the brain and surrounding structures were obtained without intravenous contrast. COMPARISON:  No prior MRI, correlation is made with CT head 08/29/2021 FINDINGS: Brain: No restricted diffusion to suggest acute or subacute infarct. No acute hemorrhage, mass, mass effect, or midline shift. Minimal T2 hyperintense signal in the periventricular white matter, likely the sequela of mild chronic small vessel ischemic disease. No extra-axial collection or hydrocephalus. Ventricles and sulci are within normal limits for age. Vascular: Normal flow voids. Skull and upper cervical spine: Normal marrow signal. Sinuses/Orbits: Negative. Other: The mastoids are well aerated. IMPRESSION: No acute intracranial process. Electronically Signed   By: Merilyn Baba M.D.   On: 08/29/2021 17:57   DG Abd Portable 1V-Small Bowel Obstruction Protocol-24 hr delay  Result Date: 09/24/2021 CLINICAL DATA:  24 hour delayed small-bowel protocol. EXAM: PORTABLE ABDOMEN -  1 VIEW COMPARISON:  09/23/2021.  Abdomen and pelvis CT dated 09/22/2021. FINDINGS: The previously demonstrated small bowel contrast has progressed into normal caliber colon extending to the rectosigmoid region with gas in normal caliber rectum. Mildly dilated small bowel loops have not changed significantly except these no longer contain contrast. Unremarkable bones. IMPRESSION: Mild partial small bowel obstruction with interval passage of all of the administrated contrast into normal caliber colon. Electronically Signed   By: Claudie Revering M.D.   On: 09/24/2021 11:33   DG Abd Portable 1V-Small Bowel Obstruction Protocol-initial, 8 hr delay  Result Date: 09/23/2021 CLINICAL DATA:  8 hour delay small-bowel protocol. EXAM: PORTABLE ABDOMEN - 1 VIEW COMPARISON:  Abdominal x-ray 09/23/2021. FINDINGS: Dilated small bowel loops are again seen measuring up to 3.2 cm similar to prior examination. Contrast is seen throughout  these dilated small bowel loops. No contrast is seen within the colon or nondilated small bowel in the pelvis. No suspicious calcifications. No acute fractures. IMPRESSION: 1. Contrast is seen within dilated small bowel in the mid abdomen. No contrast is seen within distal small bowel or colon. Findings are compatible with small-bowel obstruction. Electronically Signed   By: Ronney Asters M.D.   On: 09/23/2021 19:43   DG Abd Portable 1 View  Result Date: 09/22/2021 CLINICAL DATA:  ng tube placement EXAM: PORTABLE ABDOMEN - 1 VIEW COMPARISON:  None. FINDINGS: Enteric tube placement with tip coursing below the hemidiaphragm with tip and side port overlying the expected region of the gastric lumen. The bowel gas pattern is normal. No radio-opaque calculi or other significant radiographic abnormality are seen. Atherosclerotic plaque of the aorta. IMPRESSION: 1. Enteric tube in appropriate position. Could be retracted by 5 cm. 2.  Aortic Atherosclerosis (ICD10-I70.0). Electronically Signed   By: Iven Finn M.D.   On: 09/22/2021 23:45    Microbiology: Recent Results (from the past 240 hour(s))  Resp Panel by RT-PCR (Flu A&B, Covid) Nasopharyngeal Swab     Status: None   Collection Time: 09/22/21 10:24 PM   Specimen: Nasopharyngeal Swab; Nasopharyngeal(NP) swabs in vial transport medium  Result Value Ref Range Status   SARS Coronavirus 2 by RT PCR NEGATIVE NEGATIVE Final    Comment: (NOTE) SARS-CoV-2 target nucleic acids are NOT DETECTED.  The SARS-CoV-2 RNA is generally detectable in upper respiratory specimens during the acute phase of infection. The lowest concentration of SARS-CoV-2 viral copies this assay can detect is 138 copies/mL. A negative result does not preclude SARS-Cov-2 infection and should not be used as the sole basis for treatment or other patient management decisions. A negative result may occur with  improper specimen collection/handling, submission of specimen other than  nasopharyngeal swab, presence of viral mutation(s) within the areas targeted by this assay, and inadequate number of viral copies(<138 copies/mL). A negative result must be combined with clinical observations, patient history, and epidemiological information. The expected result is Negative.  Fact Sheet for Patients:  EntrepreneurPulse.com.au  Fact Sheet for Healthcare Providers:  IncredibleEmployment.be  This test is no t yet approved or cleared by the [place] FDA and  has been authorized for detection and/or diagnosis of SARS-CoV-2 by FDA under an Emergency Use Authorization (EUA). This EUA will remain  in effect (meaning this test can be used) for the duration of the COVID-19 declaration under Section 564(b)(1) of the Act, 21 U.S.C.section 360bbb-3(b)(1), unless the authorization is terminated  or revoked sooner.       Influenza A by PCR NEGATIVE NEGATIVE Final   Influenza  B by PCR NEGATIVE NEGATIVE Final    Comment: (NOTE) The Xpert Xpress SARS-CoV-2/FLU/RSV plus assay is intended as an aid in the diagnosis of influenza from Nasopharyngeal swab specimens and should not be used as a sole basis for treatment. Nasal washings and aspirates are unacceptable for Xpert Xpress SARS-CoV-2/FLU/RSV testing.  Fact Sheet for Patients: EntrepreneurPulse.com.au  Fact Sheet for Healthcare Providers: IncredibleEmployment.be  This test is not yet approved or cleared by the [place] FDA and has been authorized for detection and/or diagnosis of SARS-CoV-2 by FDA under an Emergency Use Authorization (EUA). This EUA will remain in effect (meaning this test can be used) for the duration of the COVID-19 declaration under Section 564(b)(1) of the Act, 21 U.S.C. section 360bbb-3(b)(1), unless the authorization is terminated or revoked.  Performed at [facility], [address] [place], [place]  [address]   Urine Culture     Status: Abnormal   Collection Time: 09/24/21  8:11 AM   Specimen: Urine, Catheterized  Result Value Ref Range Status   Specimen Description   Final    URINE, CATHETERIZED Performed at [facility] [address], [place], [place] [address]    Special Requests   Final    NONE Performed at [facility], [place] [address] [place], [place] [address]    Culture >=100,000 COLONIES/mL KLEBSIELLA PNEUMONIAE (A)  Final   Report Status 09/26/2021 FINAL  Final   Organism ID, Bacteria KLEBSIELLA PNEUMONIAE (A)  Final      Susceptibility   Klebsiella pneumoniae - MIC*    AMPICILLIN >=32 RESISTANT Resistant     CEFAZOLIN <=4 SENSITIVE Sensitive     CEFEPIME <=0.12 SENSITIVE Sensitive     CEFTRIAXONE <=0.25 SENSITIVE Sensitive     CIPROFLOXACIN <=0.25 SENSITIVE Sensitive     GENTAMICIN <=1 SENSITIVE Sensitive     IMIPENEM <=0.25 SENSITIVE Sensitive     NITROFURANTOIN <=16 SENSITIVE Sensitive     TRIMETH/SULFA <=20 SENSITIVE Sensitive     AMPICILLIN/SULBACTAM 4 SENSITIVE Sensitive     PIP/TAZO <=4 SENSITIVE Sensitive     * >=100,000 COLONIES/mL KLEBSIELLA PNEUMONIAE     Labs: Basic Metabolic Panel: Recent Labs  Lab 09/22/21 1757 09/23/21 0843 09/24/21 0834 09/25/21 0758 09/26/21 0414  NA 137 140 141 139 141  K 4.5 4.8 4.3 3.7 3.9  CL 101 105 108 110 109  CO2 25 25 24 23 24   GLUCOSE 139* 117* 76 149* 91  BUN 42* 39* 33* 19 17  CREATININE 2.70* 2.84* 2.27* 1.86* 2.18*  CALCIUM 8.9 8.6* 8.6* 8.7* 8.7*  MG  --  2.5* 2.1 1.7 1.7   Liver Function Tests: Recent Labs  Lab 09/22/21 1757 09/23/21 0843  AST 16 18  ALT 12 11  ALKPHOS 41 38  BILITOT 0.6 0.7  PROT 6.6 6.6  ALBUMIN 3.4* 3.2*   Recent Labs  Lab 09/22/21 1757  LIPASE 39   Recent Labs  Lab 09/25/21 1253  AMMONIA 26   CBC: Recent Labs  Lab 09/22/21 1757 09/23/21 0843 09/24/21 0834 09/25/21 0758  WBC 5.5 4.9 4.8 6.0  NEUTROABS 3.2  --  3.3  3.9  HGB 10.3* 10.0* 10.0* 10.2*  HCT 31.5* 31.9* 31.4* 31.4*  MCV 90.3 94.1 93.2 91.3  PLT 283 244 241 258   Cardiac Enzymes: No results for input(s): CKTOTAL, CKMB, CKMBINDEX, TROPONINI in the last 168 hours. BNP: BNP (last 3 results) Recent Labs    06/11/21 0342  BNP 23.1  ProBNP (last 3 results) No results for input(s): PROBNP in the last 8760 hours.  CBG: Recent Labs  Lab 09/26/21 1142 09/26/21 1630 09/26/21 2129 09/27/21 0719 09/27/21 1128  GLUCAP 156* 90 110* 70 101*       Signed:  Irine Seal MD.  [facility] 09/27/2021, 1:19 PM

## 2021-09-27 NOTE — Progress Notes (Signed)
Patient and POA provided and explained dc instructions in detail. Patient and POA verbalized understanding. Patient escorted to lobby in wheelchair to be discharged home with family.

## 2021-09-27 NOTE — Progress Notes (Signed)
Spoke with Dr Grandville Silos about patient's POA (sister) concerns about patient's level of confusion.Dr Grandville Silos advises member will be safe to dc home and provided detailed dc instructions; Call to Patient's POA advising that per MD member safe for dc after full eval and appropriate prescriptions written. POA verbalized understanding and states will be at hospital approx. 3pm to pick patient up for dc to home.

## 2021-09-28 ENCOUNTER — Ambulatory Visit (HOSPITAL_COMMUNITY)
Admission: EM | Admit: 2021-09-28 | Discharge: 2021-09-28 | Disposition: A | Discharge status: Home / Self Care | Payer: Medicare PPO | Attending: Psychiatry | Admitting: Psychiatry

## 2021-09-28 DIAGNOSIS — R451 Restlessness and agitation: Secondary | ICD-10-CM | POA: Diagnosis not present

## 2021-09-28 DIAGNOSIS — F4322 Adjustment disorder with anxiety: Secondary | ICD-10-CM | POA: Insufficient documentation

## 2021-09-28 DIAGNOSIS — R450 Nervousness: Secondary | ICD-10-CM | POA: Insufficient documentation

## 2021-09-28 DIAGNOSIS — R413 Other amnesia: Secondary | ICD-10-CM | POA: Insufficient documentation

## 2021-09-28 NOTE — Discharge Summary (Signed)
Teresa Poole to be D/C'd Home per NP order. Discussed with the patient and all questions fully answered. An After Visit Summary was printed and given to the patient. Patient escorted out and D/C home via private auto.  [name]  09/28/2021 6:57 PM

## 2021-09-28 NOTE — Progress Notes (Signed)
°   09/28/21 [place] (Walk-ins at [facility] only)  How Did You Hear About [place]? Family/Friend  What Is the Reason for Your Visit/Call Today? 71 year old female present to [facility] accompanied by her sister with compiants of memory loss. Denied additional concerns, denied SI/HI and denied AVH,  How Long Has This Been Causing You Problems? <Week  Have You Recently Had Any Thoughts About Hurting Yourself? No  Are You Planning to Commit Suicide/Harm Yourself At This time? No  Have you Recently Had Thoughts About [place]? No  Are You Planning To Harm Someone At This Time? No  Are you currently experiencing any auditory, visual or other hallucinations? No  Have You Used Any Alcohol or Drugs in the Past 24 Hours? No  Do you have any current medical co-morbidities that require immediate attention? No  Clinician description of patient physical appearance/behavior: well groomed  What Do You Feel Would Help You the Most Today? Stress Management  If access to [facility] was not available, would you have sought care in the Emergency Department? Yes  Determination of Need Routine (7 days)  Options For Referral Other: Comment

## 2021-09-28 NOTE — ED Provider Notes (Signed)
[facility] Medical Screening Exam  Patient Name: Teresa Poole MRN: [id number] Date of Evaluation: 09/28/21 Chief Complaint:   Diagnosis:  Final diagnoses:  Adjustment disorder with anxiety    Teresa Poole, 71 year old patient presented to [facility] as a walk in  accompanied by her sister with complaints of memory loss.  Teresa Poole, 71 y.o., female patient seen face to face by this provider, consulted with Dr. Serafina Mitchell; and chart reviewed on 09/28/21.   Per chart review patient was admitted to the [facility] medical floor due to small bowel obstruction on 09/22/2021.  During patient's hospitalization it was documented that patient was confused and agitated at times.  She was prescribed olanzapine 5 mg nightly.  It is also documented that sister expressed concerns due to patient's confusion and memory loss.  Patient was medically cleared and discharged on 09/27/2021.  Patient denies any previous psychiatric history.  However a psychiatric consult was performed during a previous medical hospitalization on 06/10/2021 by Dr. Lovette Cliche for increased anxiety due to her declining health conditions.  On evaluation Teresa Poole is in sitting position.  She is alert/oriented x4.  Her speech is clear, coherent, normal rate and tone. She is easily agitated.  Patient is repetitive and does not appear to realize that she is repeating the same information throughout the assessment.  She is cooperative and makes good eye contact.  She endorses increased anxiety.  She denies depression but states, "I am sick of being this way if I am going to be diagnosed with mental illness I need someone to help me".  She reports that for quite some time she has noticed a decrease in her memory.  Today she drove to church and she could not remember her address or how to get back home.  Two church members escorted patient home and contacted her sister.She does not appear to be responding to  internal/external stimuli.  She denies AVH.  She denies SI/HI.  However she expresses her concerns that if she forgets to turn off the stove she may hurt herself or burn down the whole complex.  States, "if I get to the place where I cannot function and I cannot help other people, I will kill myself".  Patient is adamant that she is not SI at this time.  She contracts for safety.  She denies access to firearms/weapons.  She continues to repeat herself and states her long-term memory is intact and expresses her fear of her memory getting worse.  She gets agitated throughout the interview and at times her speech becomes pressured and loud.  She called this Probation officer a "stupid bitch".    MMSE was conducted and the patient scored 24 points.  MRI of the brain without contrast was conducted on 08/29/2021.  Impression showed no acute intracranial processes.  Discussed the importance of patient connecting with a neurologist.  Patient and her sister were frustrated with this encounter, they were looking for answers regarding patient's memory loss and agitation.  At this time Teresa Poole is educated and verbalizes understanding of mental health resources and other crisis services in the community.  She is instructed to call 911 and present to the nearest emergency room should she experience any suicidal/homicidal ideation, auditory/visual/hallucinations, or detrimental worsening of her mental health condition.  She was a also advised by Probation officer that she could call the toll-free phone on insurance card to assist with identifying in network counselors and agencies or number on back  of Medicaid card to speak with care coordinator     Psychiatric Specialty Exam  Presentation  General Appearance:No data recorded Eye Contact:No data recorded Speech:No data recorded Speech Volume:No data recorded Handedness:No data recorded  Mood and Affect  Mood:No data recorded Affect:No data recorded  Thought Process   Thought Processes:No data recorded Descriptions of Associations:No data recorded Orientation:No data recorded Thought Content:No data recorded   Hallucinations:No data recorded Ideas of Reference:No data recorded Suicidal Thoughts:No data recorded Homicidal Thoughts:No data recorded  Sensorium  Memory:No data recorded Judgment:No data recorded Insight:No data recorded  Executive Functions  Concentration:No data recorded Attention Span:No data recorded Recall:No data recorded Fund of Knowledge:No data recorded Language:No data recorded  Psychomotor Activity  Psychomotor Activity:No data recorded  Assets  Assets:No data recorded  Sleep  Sleep:No data recorded Number of hours: No data recorded  No data recorded  Physical Exam: Physical Exam Vitals and nursing note reviewed.  Constitutional:      General: She is not in acute distress.    Appearance: Normal appearance. She is not ill-appearing.  HENT:     Head: Normocephalic.  Eyes:     General:        Right eye: No discharge.        Left eye: No discharge.     Conjunctiva/sclera: Conjunctivae normal.  Cardiovascular:     Rate and Rhythm: Normal rate.  Pulmonary:     Effort: Pulmonary effort is normal.  Musculoskeletal:        General: Normal range of motion.     Cervical back: Normal range of motion.  Skin:    Coloration: Skin is not jaundiced or pale.  Neurological:     Mental Status: She is alert and oriented to person, place, and time.  Psychiatric:        Attention and Perception: Attention and perception normal.        Mood and Affect: Mood is anxious. Affect is angry.        Speech: Speech normal.        Behavior: Behavior is agitated. Behavior is cooperative.        Thought Content: Thought content normal.        Cognition and Memory: Cognition normal.        Judgment: Judgment normal.   Review of Systems  Constitutional: Negative.   HENT: Negative.    Eyes: Negative.   Respiratory: Negative.     Cardiovascular: Negative.   Musculoskeletal: Negative.   Skin: Negative.   Neurological: Negative.   Psychiatric/Behavioral:  Positive for memory loss. The patient is nervous/anxious.   Blood pressure (!) 157/76, pulse 77, temperature 98.3 F (36.8 C), temperature source Oral, resp. rate 18, SpO2 99 %. There is no height or weight on file to calculate BMI.  Musculoskeletal: Strength & Muscle Tone: within normal limits Gait & Station: normal Patient leans: N/A   [facility] MSE Discharge Disposition for Follow up and Recommendations: Based on my evaluation the patient does not appear to have an emergency medical condition and can be discharged with resources and follow up care in outpatient services for Neurology   Discharge patient.  Patient is to follow up with PCP Dr. Jobe-educated patient to contact PCP tomorrow and inform hospital admission, and to request referral to neurology due to concerns with memory loss.  Provided resources for Neurology.   No evidence of imminent risk to self or others at present.    Patient does not meet criteria for psychiatric inpatient admission.  Discussed crisis plan, support from social network, calling 911, coming to the Emergency Department, and calling Suicide Hotline.  Revonda Humphrey, NP 09/28/2021, 7:04 PM

## 2021-09-28 NOTE — Discharge Instructions (Signed)

## 2021-09-29 ENCOUNTER — Other Ambulatory Visit (HOSPITAL_COMMUNITY): Payer: Self-pay

## 2021-10-14 ENCOUNTER — Encounter (HOSPITAL_BASED_OUTPATIENT_CLINIC_OR_DEPARTMENT_OTHER): Payer: Self-pay | Admitting: *Deleted

## 2021-10-14 ENCOUNTER — Other Ambulatory Visit: Payer: Self-pay

## 2021-10-14 ENCOUNTER — Inpatient Hospital Stay (HOSPITAL_BASED_OUTPATIENT_CLINIC_OR_DEPARTMENT_OTHER)
Admission: EM | Admit: 2021-10-14 | Discharge: 2021-10-15 | DRG: 683 | Discharge status: Against Medical Advice | Payer: Medicare Other | Attending: Internal Medicine | Admitting: Internal Medicine

## 2021-10-14 DIAGNOSIS — D631 Anemia in chronic kidney disease: Secondary | ICD-10-CM | POA: Diagnosis present

## 2021-10-14 DIAGNOSIS — F419 Anxiety disorder, unspecified: Secondary | ICD-10-CM | POA: Diagnosis present

## 2021-10-14 DIAGNOSIS — N184 Chronic kidney disease, stage 4 (severe): Secondary | ICD-10-CM | POA: Diagnosis not present

## 2021-10-14 DIAGNOSIS — B961 Klebsiella pneumoniae [K. pneumoniae] as the cause of diseases classified elsewhere: Secondary | ICD-10-CM | POA: Diagnosis present

## 2021-10-14 DIAGNOSIS — I428 Other cardiomyopathies: Secondary | ICD-10-CM | POA: Diagnosis present

## 2021-10-14 DIAGNOSIS — Z5329 Procedure and treatment not carried out because of patient's decision for other reasons: Secondary | ICD-10-CM | POA: Diagnosis present

## 2021-10-14 DIAGNOSIS — Z20822 Contact with and (suspected) exposure to covid-19: Secondary | ICD-10-CM | POA: Diagnosis present

## 2021-10-14 DIAGNOSIS — Z79899 Other long term (current) drug therapy: Secondary | ICD-10-CM | POA: Diagnosis not present

## 2021-10-14 DIAGNOSIS — Z888 Allergy status to other drugs, medicaments and biological substances status: Secondary | ICD-10-CM

## 2021-10-14 DIAGNOSIS — E782 Mixed hyperlipidemia: Secondary | ICD-10-CM | POA: Diagnosis not present

## 2021-10-14 DIAGNOSIS — E1122 Type 2 diabetes mellitus with diabetic chronic kidney disease: Secondary | ICD-10-CM | POA: Diagnosis present

## 2021-10-14 DIAGNOSIS — Z7985 Long-term (current) use of injectable non-insulin antidiabetic drugs: Secondary | ICD-10-CM | POA: Diagnosis not present

## 2021-10-14 DIAGNOSIS — N390 Urinary tract infection, site not specified: Secondary | ICD-10-CM | POA: Diagnosis present

## 2021-10-14 DIAGNOSIS — E1169 Type 2 diabetes mellitus with other specified complication: Secondary | ICD-10-CM | POA: Diagnosis present

## 2021-10-14 DIAGNOSIS — N179 Acute kidney failure, unspecified: Secondary | ICD-10-CM | POA: Diagnosis not present

## 2021-10-14 DIAGNOSIS — I10 Essential (primary) hypertension: Secondary | ICD-10-CM | POA: Diagnosis present

## 2021-10-14 DIAGNOSIS — I5022 Chronic systolic (congestive) heart failure: Secondary | ICD-10-CM | POA: Diagnosis present

## 2021-10-14 DIAGNOSIS — Z8249 Family history of ischemic heart disease and other diseases of the circulatory system: Secondary | ICD-10-CM

## 2021-10-14 DIAGNOSIS — Z833 Family history of diabetes mellitus: Secondary | ICD-10-CM | POA: Diagnosis not present

## 2021-10-14 DIAGNOSIS — I160 Hypertensive urgency: Secondary | ICD-10-CM | POA: Diagnosis present

## 2021-10-14 DIAGNOSIS — Z8543 Personal history of malignant neoplasm of ovary: Secondary | ICD-10-CM

## 2021-10-14 DIAGNOSIS — I130 Hypertensive heart and chronic kidney disease with heart failure and stage 1 through stage 4 chronic kidney disease, or unspecified chronic kidney disease: Secondary | ICD-10-CM | POA: Diagnosis present

## 2021-10-14 DIAGNOSIS — I5042 Chronic combined systolic (congestive) and diastolic (congestive) heart failure: Secondary | ICD-10-CM | POA: Diagnosis present

## 2021-10-14 DIAGNOSIS — I152 Hypertension secondary to endocrine disorders: Secondary | ICD-10-CM | POA: Diagnosis present

## 2021-10-14 DIAGNOSIS — R42 Dizziness and giddiness: Secondary | ICD-10-CM | POA: Diagnosis not present

## 2021-10-14 LAB — COMPREHENSIVE METABOLIC PANEL
ALT: 27 U/L (ref 0–44)
AST: 27 U/L (ref 15–41)
Albumin: 3.3 g/dL — ABNORMAL LOW (ref 3.5–5.0)
Alkaline Phosphatase: 46 U/L (ref 38–126)
Anion gap: 8 (ref 5–15)
BUN: 57 mg/dL — ABNORMAL HIGH (ref 8–23)
CO2: 27 mmol/L (ref 22–32)
Calcium: 8.7 mg/dL — ABNORMAL LOW (ref 8.9–10.3)
Chloride: 106 mmol/L (ref 98–111)
Creatinine, Ser: 3.71 mg/dL — ABNORMAL HIGH (ref 0.44–1.00)
GFR, Estimated: 13 mL/min — ABNORMAL LOW (ref >60)
Glucose, Bld: 171 mg/dL — ABNORMAL HIGH (ref 70–99)
Potassium: 4.7 mmol/L (ref 3.5–5.1)
Sodium: 141 mmol/L (ref 135–145)
Total Bilirubin: 0.4 mg/dL (ref 0.3–1.2)
Total Protein: 6.8 g/dL (ref 6.5–8.1)

## 2021-10-14 LAB — URINALYSIS, ROUTINE W REFLEX MICROSCOPIC
Bilirubin Urine: NEGATIVE
Glucose, UA: NEGATIVE mg/dL
Ketones, ur: NEGATIVE mg/dL
Nitrite: NEGATIVE
Protein, ur: 30 mg/dL — AB
Specific Gravity, Urine: 1.025 (ref 1.005–1.030)
pH: 5.5 (ref 5.0–8.0)

## 2021-10-14 LAB — CBC WITH DIFFERENTIAL/PLATELET
Abs Immature Granulocytes: 0.02 10*3/uL (ref 0.00–0.07)
Basophils Absolute: 0 10*3/uL (ref 0.0–0.1)
Basophils Relative: 0 %
Eosinophils Absolute: 0.1 10*3/uL (ref 0.0–0.5)
Eosinophils Relative: 2 %
HCT: 28.5 % — ABNORMAL LOW (ref 36.0–46.0)
Hemoglobin: 9.2 g/dL — ABNORMAL LOW (ref 12.0–15.0)
Immature Granulocytes: 0 %
Lymphocytes Relative: 22 %
Lymphs Abs: 1.5 10*3/uL (ref 0.7–4.0)
MCH: 29 pg (ref 26.0–34.0)
MCHC: 32.3 g/dL (ref 30.0–36.0)
MCV: 89.9 fL (ref 80.0–100.0)
Monocytes Absolute: 0.4 10*3/uL (ref 0.1–1.0)
Monocytes Relative: 6 %
Neutro Abs: 4.7 10*3/uL (ref 1.7–7.7)
Neutrophils Relative %: 70 %
Platelets: 285 10*3/uL (ref 150–400)
RBC: 3.17 MIL/uL — ABNORMAL LOW (ref 3.87–5.11)
RDW: 12.9 % (ref 11.5–15.5)
WBC: 6.7 10*3/uL (ref 4.0–10.5)
nRBC: 0 % (ref 0.0–0.2)

## 2021-10-14 LAB — URINALYSIS, MICROSCOPIC (REFLEX)
Squamous Epithelial / HPF: NONE SEEN (ref 0–5)
WBC, UA: >50 WBC/hpf (ref 0–5)

## 2021-10-14 LAB — RESP PANEL BY RT-PCR (FLU A&B, COVID) ARPGX2
Influenza A by PCR: NEGATIVE
Influenza B by PCR: NEGATIVE
SARS Coronavirus 2 by RT PCR: NEGATIVE

## 2021-10-14 LAB — GLUCOSE, CAPILLARY: Glucose-Capillary: 194 mg/dL — ABNORMAL HIGH (ref 70–99)

## 2021-10-14 MED ORDER — HEPARIN SODIUM (PORCINE) 5000 UNIT/ML IJ SOLN
5000.0000 [IU] | Freq: Three times a day (TID) | INTRAMUSCULAR | Status: DC
  Administered 2021-10-15: 5000 [IU] via SUBCUTANEOUS
  Filled 2021-10-14 (×2): qty 1

## 2021-10-14 MED ORDER — NIFEDIPINE ER OSMOTIC RELEASE 60 MG PO TB24
60.0000 mg | ORAL_TABLET | Freq: Every day | ORAL | Status: DC
  Filled 2021-10-14: qty 1

## 2021-10-14 MED ORDER — SODIUM CHLORIDE 0.9 % IV SOLN: INTRAVENOUS | Status: DC | PRN

## 2021-10-14 MED ORDER — SODIUM CHLORIDE 0.9 % IV BOLUS
500.0000 mL | Freq: Once | INTRAVENOUS | Status: AC
  Administered 2021-10-14: 500 mL via INTRAVENOUS

## 2021-10-14 MED ORDER — LABETALOL HCL 5 MG/ML IV SOLN: 5.0000 mg | INTRAVENOUS | Status: DC | PRN

## 2021-10-14 MED ORDER — CARVEDILOL 25 MG PO TABS
25.0000 mg | ORAL_TABLET | Freq: Two times a day (BID) | ORAL | Status: DC
Start: 2021-10-15 — End: 2021-10-15
  Administered 2021-10-15: 25 mg via ORAL
  Filled 2021-10-14: qty 1

## 2021-10-14 MED ORDER — SODIUM CHLORIDE 0.9 % IV SOLN: INTRAVENOUS | Status: DC

## 2021-10-14 MED ORDER — INSULIN ASPART 100 UNIT/ML IJ SOLN: 0.0000 [IU] | Freq: Three times a day (TID) | INTRAMUSCULAR | Status: DC

## 2021-10-14 MED ORDER — EZETIMIBE 10 MG PO TABS: 10.0000 mg | ORAL_TABLET | Freq: Every day | ORAL | Status: DC

## 2021-10-14 MED ORDER — SODIUM CHLORIDE 0.9 % IV SOLN
1.0000 g | Freq: Once | INTRAVENOUS | Status: AC
  Administered 2021-10-14: 1 g via INTRAVENOUS
  Filled 2021-10-14: qty 10

## 2021-10-14 NOTE — ED Notes (Signed)
Spoke with Maggie in lab to add on urine culture

## 2021-10-14 NOTE — ED Provider Notes (Signed)
[place] EMERGENCY DEPARTMENT Provider Note   CSN: [id number] Arrival date & time: 10/14/21  1342     History  Chief Complaint  Patient presents with   Dizziness    Teresa Poole is a 71 y.o. female.  Patient is a 71 year old female who presents with dizziness.  She states that today she had 2 episodes when she stood up she got dizzy and lightheaded.  She felt like the blood drained out of her head.  She did not pass out.  She said the dizziness lasted about 2 minutes.  There is no spinning sensation.  No chest pain or palpitations.  No shortness of breath.  She denies any numbness or weakness to her extremities.  No facial drooping.  No speech deficits or vision changes.  She does note that she woke up this morning with a little bit of nasal congestion.  She is also had some minor urinary symptoms such as burning on urination for the last couple weeks.  She reports that she took an antibiotics about 2 weeks ago for urinary tract infection.  She does have a history of chronic kidney disease and is followed by nephrologist.  Per chart review, she also had a recent admission for small bowel obstruction.      Home Medications Prior to Admission medications   Medication Sig Start Date End Date Taking? Authorizing Provider  carvedilol (COREG) 25 MG tablet Take 25 mg by mouth 2 (two) times daily with a meal.    [provider]  cycloSPORINE, PF, (CEQUA) 0.09 % SOLN Place 1 drop into both eyes in the morning and at bedtime. 12/18/20   [provider]  ezetimibe (ZETIA) 10 MG tablet Take 10 mg by mouth daily. 06/09/21   [provider]  famotidine (PEPCID) 40 MG tablet Take 40 mg by mouth daily. 09/04/21   [provider]  furosemide (LASIX) 20 MG tablet Take 20 mg by mouth daily. 09/17/21   [provider]  glucose blood (ONE TOUCH ULTRA TEST) test strip Use to check blood sugar 3 times daily Dx code E11.65 Patient taking  differently: 1 each by Other route See admin instructions. 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 09/17/21   [provider]  indapamide (LOZOL) 2.5 MG tablet Take 2.5 mg by mouth daily. 09/22/21   [provider]  lisinopril (PRINIVIL,ZESTRIL) 40 MG tablet Take 1 tablet (40 mg total) by mouth daily. 10/10/18   Copland, Gay Filler, MD  NIFEdipine (ADALAT CC) 60 MG 24 hr tablet Take 1 tablet (60 mg total) by mouth daily. 07/09/21   Daleen Bo, MD  OLANZapine zydis (ZYPREXA) 5 MG disintegrating tablet Take 1 tablet (5 mg total) by mouth daily. 09/28/21   Eugenie Filler, MD  omeprazole (PRILOSEC) 40 MG capsule Take 40 mg by mouth daily. 06/08/21   [provider]  ONETOUCH VERIO test strip [place] Patient taking differently: 1 each by Other route in the morning, at noon, and at bedtime. 03/30/18   Elayne Snare, MD  OZEMPIC, 0.25 OR 0.5 MG/DOSE, 2 MG/1.5ML SOPN Inject 0.25 mg into the skin once a week. Sundays 05/12/21   [provider]  polyethylene glycol (MIRALAX / GLYCOLAX) 17 g packet Take 17 g by mouth daily. 09/28/21   Irine Seal  V, MD  sucralfate (CARAFATE) 1 GM/10ML suspension Take 0.5 g by mouth in the morning, at noon, in the evening, and at bedtime. 09/14/21   [provider]      Allergies    Hydralazine hcl and Atorvastatin    Review of Systems   Review of Systems  Constitutional:  Negative for chills, diaphoresis, fatigue and fever.  HENT:  Positive for congestion, postnasal drip and rhinorrhea. Negative for sneezing.   Eyes: Negative.   Respiratory:  Negative for cough, chest tightness and shortness of breath.   Cardiovascular:  Negative for chest pain and leg swelling.  Gastrointestinal:  Negative for abdominal pain, blood in stool, diarrhea, nausea and vomiting.   Genitourinary:  Negative for difficulty urinating, flank pain, frequency and hematuria.  Musculoskeletal:  Negative for arthralgias and back pain.  Skin:  Negative for rash.  Neurological:  Positive for light-headedness. Negative for dizziness, speech difficulty, weakness, numbness and headaches.   Physical Exam Updated Vital Signs BP 109/62    Pulse 66    Temp 98.2 F (36.8 C) (Oral)    Resp 15    Ht 5\' 7"  (1.702 m)    Wt 70.3 kg    SpO2 99%    BMI 24.27 kg/m  Physical Exam Constitutional:      Appearance: She is well-developed.  HENT:     Head: Normocephalic and atraumatic.  Eyes:     Pupils: Pupils are equal, round, and reactive to light.  Cardiovascular:     Rate and Rhythm: Normal rate and regular rhythm.     Heart sounds: Normal heart sounds.  Pulmonary:     Effort: Pulmonary effort is normal. No respiratory distress.     Breath sounds: Normal breath sounds. No wheezing or rales.  Chest:     Chest wall: No tenderness.  Abdominal:     General: Bowel sounds are normal.     Palpations: Abdomen is soft.     Tenderness: There is no abdominal tenderness. There is no guarding or rebound.  Musculoskeletal:        General: Normal range of motion.     Cervical back: Normal range of motion and neck supple.  Lymphadenopathy:     Cervical: No cervical adenopathy.  Skin:    General: Skin is warm and dry.     Findings: No rash.  Neurological:     Mental Status: She is alert and oriented to person, place, and time.     Comments: Motor 5/5 all extremities Sensation grossly intact to LT all extremities Finger to Nose intact, no pronator drift CN II-XII grossly intact      ED Results / Procedures / Treatments   Labs (all labs ordered are listed, but only abnormal results are displayed) Labs Reviewed  URINALYSIS, ROUTINE W REFLEX MICROSCOPIC - Abnormal; Notable for the following components:      Result Value   APPearance HAZY (*)    Hgb urine dipstick TRACE (*)    Protein,  ur 30 (*)    Leukocytes,Ua MODERATE (*)    All other components within normal limits  CBC WITH DIFFERENTIAL/PLATELET - Abnormal; Notable for the following components:   RBC 3.17 (*)    Hemoglobin 9.2 (*)    HCT 28.5 (*)    All other components within normal limits  COMPREHENSIVE METABOLIC PANEL - Abnormal; Notable for the following components:   Glucose, Bld 171 (*)    BUN 57 (*)    Creatinine, Ser 3.71 (*)  Calcium 8.7 (*)    Albumin 3.3 (*)    GFR, Estimated 13 (*)    All other components within normal limits  URINALYSIS, MICROSCOPIC (REFLEX) - Abnormal; Notable for the following components:   Bacteria, UA MANY (*)    Non Squamous Epithelial PRESENT (*)    All other components within normal limits  RESP PANEL BY RT-PCR (FLU A&B, COVID) [id number]  URINE CULTURE    EKG EKG Interpretation  Date/Time:  Tuesday October 14 2021 13:52:27 EST Ventricular Rate:  66 PR Interval:  184 QRS Duration: 142 QT Interval:  462 QTC Calculation: 484 R Axis:   -26 Text Interpretation: Normal sinus rhythm Right bundle branch block Possible Lateral infarct , age undetermined Abnormal ECG When compared with ECG of 22-Sep-2021 18:50, PREVIOUS ECG IS PRESENT since last tracing no significant change Confirmed by Malvin Johns [phone]) on 10/14/2021 4:06:08 PM  Radiology No results found.  Procedures Procedures    Medications Ordered in ED Medications  0.9 %  sodium chloride infusion (has no administration in time range)  sodium chloride 0.9 % bolus 500 mL (500 mLs Intravenous New Bag/Given 10/14/21 1653)  cefTRIAXone (ROCEPHIN) 1 g in sodium chloride 0.9 % 100 mL IVPB (1 g Intravenous New Bag/Given 10/14/21 1655)    ED Course/ Medical Decision Making/ A&P                           Medical Decision Making Amount and/or Complexity of Data Reviewed Labs: ordered.  Risk Prescription drug management. Decision regarding hospitalization.   Patient is a 71 year old female who presents with  lightheadedness.  No arrhythmias are noted on EKG.  She has no focal neurologic deficits.  Her labs are reviewed.  She does have an increase in her creatinine.  On chart review, her baseline appears to be in the mid twos.  It has gone up to 2.9.  Today it is 3.71.  Spoke with the patient's nephrologist with [facility], Dr. Beather Arbour.  He was leaning towards patient being admitted for hydration and see if her creatinine improves.  She was started on IV fluids and Rocephin for her UTI.  I spoke with Dr. Antonieta Pert who will admit the patient for further treatment.  Patient notes that she was recently on antibiotics.  I did contact her [facility] in [place] and they do not see any recent prescriptions for antibiotics since December.  She reports this is where she got it filled.  Final Clinical Impression(s) / ED Diagnoses Final diagnoses:  Acute renal failure, unspecified acute renal failure type ([place])  Urinary tract infection without hematuria, site unspecified    Rx / DC Orders ED Discharge Orders     None         Malvin Johns, MD 10/14/21 1753

## 2021-10-14 NOTE — ED Triage Notes (Addendum)
Dizziness when she stands. No pain. She lives alone. Her sister drove her here today. Pt states she cant remember events then she gives me details about her hx and events asked about.

## 2021-10-14 NOTE — ED Notes (Signed)
Pt. Is alert to self and situation.  Pt. Reports she has been in the hospital for urinary failure?? For the last 10 days??

## 2021-10-14 NOTE — H&P (Signed)
History and Physical    Teresa Poole FMB:[id number] DOB: 08/21/1951 DOA: 10/14/2021  PCP: [name], MD  Patient coming from: Home.  Patient was accepted in transfer from [place].  Chief Complaint: Dizziness.  HPI: Teresa Poole is a 71 y.o. female with history of chronic kidney disease stage IV, nonischemic cardiomyopathy last EF was in 2022 was 50 to 60% with grade 1 diastolic dysfunction, hypertension, diabetes mellitus, chronic anemia who has recently been having memory issues had followed up with neurologist recently also admitted early part of this month for small bowel obstruction had some vague complaints including dizziness and was brought to the ER.  I talked to patient's sister and patient's sister states that patient had some complaints of dizziness some burning sensation of the eyes.  ED Course: In the ER patient appears nonfocal and labs show worsening of creatinine from 2.1 and February 3 this month and it is around 3.7 now.  UA is also seeing features concerning for UTI.  Patient was given fluid and started on ceftriaxone admitted for further observation.  COVID test negative.  Review of Systems: As per HPI, rest all negative.   Past Medical History:  Diagnosis Date   Allergy    Anemia    Carotid stenosis    Chronic combined systolic and diastolic CHF (congestive heart failure) (HCC)    CKD (chronic kidney disease), stage IV ([place]) 09/24/2013   Pt at [facility], Dr. Joelyn Oms   Diabetes mellitus type 2 in nonobese [facility])    Edema 09/14/2013   GERD (gastroesophageal reflux disease)    History of ovarian cancer    Hyperlipidemia    Hypertension    Mild CAD    non-obstructive by LHC (09/25/2013): Proximal and mid LAD serial 20%, proximal circumflex 30%, mid AV groove circumflex 30%, mid RCA mild plaque.   NICM (nonischemic cardiomyopathy) (HCC)    Obesity (BMI 30-39.9)    RBBB    Renal artery stenosis ([place]) 10/11/2018   Thyroid disease    Seen by  specialist    Past Surgical History:  Procedure Laterality Date   LEFT HEART CATHETERIZATION WITH CORONARY ANGIOGRAM N/A 09/25/2013   Procedure: LEFT HEART CATHETERIZATION WITH CORONARY ANGIOGRAM;  Surgeon: Burnell Blanks, MD;  Location: [facility] CATH LAB;  Service: Cardiovascular;  Laterality: N/A;   RENAL ANGIOGRAPHY N/A 10/06/2018   Procedure: RENAL ANGIOGRAPHY;  Surgeon: Marty Heck, MD;  Location: [facility] CV LAB;  Service: Cardiovascular;  Laterality: N/A;     reports that she has never smoked. She has never used smokeless tobacco. She reports that she does not drink alcohol and does not use drugs.  Allergies  Allergen Reactions   Hydralazine Hcl Itching    ENTIRE BODY = burning sensation, also in the eyes (they have become very sensitive to light)   Atorvastatin Other (See Comments)    Per MD - pt not sure of reaction     Family History  Problem Relation Age of Onset   Diabetes Father    Hypertension Father    Hypertension Maternal Grandfather     Prior to Admission medications   Medication Sig Start Date End Date Taking? Authorizing Provider  carvedilol (COREG) 25 MG tablet Take 25 mg by mouth 2 (two) times daily with a meal.    [provider]  cycloSPORINE, PF, (CEQUA) 0.09 % SOLN Place 1 drop into both eyes in the morning and at bedtime. 12/18/20   [provider]  ezetimibe (ZETIA)  10 MG tablet Take 10 mg by mouth daily. 06/09/21   [provider]  famotidine (PEPCID) 40 MG tablet Take 40 mg by mouth daily. 09/04/21   [provider]  furosemide (LASIX) 20 MG tablet Take 20 mg by mouth daily. 09/17/21   [provider]  glucose blood (ONE TOUCH ULTRA TEST) test strip Use to check blood sugar 3 times daily Dx code E11.65 Patient taking differently: 1 each by Other route See admin instructions. Use to check blood sugar 3 times daily Dx code E11.65 11/20/14   Elayne Snare, MD  hydrOXYzine (ATARAX) 10 MG tablet Take 10 mg  by mouth 3 (three) times daily as needed for anxiety. 09/17/21   [provider]  indapamide (LOZOL) 2.5 MG tablet Take 2.5 mg by mouth daily. 09/22/21   [provider]  lisinopril (PRINIVIL,ZESTRIL) 40 MG tablet Take 1 tablet (40 mg total) by mouth daily. 10/10/18   Copland, Gay Filler, MD  NIFEdipine (ADALAT CC) 60 MG 24 hr tablet Take 1 tablet (60 mg total) by mouth daily. 07/09/21   Daleen Bo, MD  OLANZapine zydis (ZYPREXA) 5 MG disintegrating tablet Take 1 tablet (5 mg total) by mouth daily. 09/28/21   Eugenie Filler, MD  omeprazole (PRILOSEC) 40 MG capsule Take 40 mg by mouth daily. 06/08/21   [provider]  ONETOUCH VERIO test strip [place] Patient taking differently: 1 each by Other route in the morning, at noon, and at bedtime. 03/30/18   Elayne Snare, MD  OZEMPIC, 0.25 OR 0.5 MG/DOSE, 2 MG/1.5ML SOPN Inject 0.25 mg into the skin once a week. Sundays 05/12/21   [provider]  polyethylene glycol (MIRALAX / GLYCOLAX) 17 g packet Take 17 g by mouth daily. 09/28/21   Eugenie Filler, MD  sucralfate (CARAFATE) 1 GM/10ML suspension Take 0.5 g by mouth in the morning, at noon, in the evening, and at bedtime. 09/14/21   [provider]    Physical Exam: Constitutional: Moderately built and nourished. Vitals:   10/14/21 1522 10/14/21 1615 10/14/21 1941 10/14/21 2040  BP: (!) 122/56 109/62 123/78 (!) 160/73  Pulse: 65 66 85 81  Resp: 17 15 17 15   Temp:    98.4 F (36.9 C)  TempSrc:    Oral  SpO2: 100% 99% 100% 100%  Weight:    69.9 kg  Height:    5\' 7"  (1.702 m)   Eyes: Anicteric no pallor. ENMT: No discharge from the ears eyes nose and mouth. Neck: No mass felt.  No neck rigidity. Respiratory: No rhonchi or crepitations. Cardiovascular: S1-S2 heard. Abdomen: Soft nontender bowel sound present. Musculoskeletal: No edema. Skin: No rash. Neurologic: Alert awake oriented time place and person.  Moves all  extremities. Psychiatric: Has some difficulty bringing out memories from recent.   Labs on Admission: I have personally reviewed following labs and imaging studies  CBC: Recent Labs  Lab 10/14/21 1353  WBC 6.7  NEUTROABS 4.7  HGB 9.2*  HCT 28.5*  MCV 89.9  PLT 789   Basic Metabolic Panel: Recent Labs  Lab 10/14/21 1353  NA 141  K 4.7  CL 106  CO2 27  GLUCOSE 171*  BUN 57*  CREATININE 3.71*  CALCIUM 8.7*   GFR: Estimated Creatinine Clearance: 13.9 mL/min (A) (by C-G formula based on SCr of 3.71 mg/dL (H)). Liver Function Tests: Recent Labs  Lab 10/14/21 1353  AST 27  ALT 27  ALKPHOS 46  BILITOT 0.4  PROT  6.8  ALBUMIN 3.3*   No results for input(s): LIPASE, AMYLASE in the last 168 hours. No results for input(s): AMMONIA in the last 168 hours. Coagulation Profile: No results for input(s): INR, PROTIME in the last 168 hours. Cardiac Enzymes: No results for input(s): CKTOTAL, CKMB, CKMBINDEX, TROPONINI in the last 168 hours. BNP (last 3 results) No results for input(s): PROBNP in the last 8760 hours. HbA1C: No results for input(s): HGBA1C in the last 72 hours. CBG: Recent Labs  Lab 10/14/21 2057  GLUCAP 194*   Lipid Profile: No results for input(s): CHOL, HDL, LDLCALC, TRIG, CHOLHDL, LDLDIRECT in the last 72 hours. Thyroid Function Tests: No results for input(s): TSH, T4TOTAL, FREET4, T3FREE, THYROIDAB in the last 72 hours. Anemia Panel: No results for input(s): VITAMINB12, FOLATE, FERRITIN, TIBC, IRON, RETICCTPCT in the last 72 hours. Urine analysis:    Component Value Date/Time   COLORURINE YELLOW 10/14/2021 1353   APPEARANCEUR HAZY (A) 10/14/2021 1353   LABSPEC 1.025 10/14/2021 1353   PHURINE 5.5 10/14/2021 1353   GLUCOSEU NEGATIVE 10/14/2021 1353   HGBUR TRACE (A) 10/14/2021 1353   BILIRUBINUR NEGATIVE 10/14/2021 1353   KETONESUR NEGATIVE 10/14/2021 1353   PROTEINUR 30 (A) 10/14/2021 1353   UROBILINOGEN 0.2 09/22/2013 1451   NITRITE  NEGATIVE 10/14/2021 1353   LEUKOCYTESUR MODERATE (A) 10/14/2021 1353   Sepsis Labs: @LABRCNTIP (procalcitonin:4,lacticidven:4) ) Recent Results (from the past 240 hour(s))  Resp Panel by RT-PCR (Flu A&B, Covid) Nasopharyngeal Swab     Status: None   Collection Time: 10/14/21  4:26 PM   Specimen: Nasopharyngeal Swab; Nasopharyngeal(NP) swabs in vial transport medium  Result Value Ref Range Status   SARS Coronavirus 2 by RT PCR NEGATIVE NEGATIVE Final    Comment: (NOTE) SARS-CoV-2 target nucleic acids are NOT DETECTED.  The SARS-CoV-2 RNA is generally detectable in upper respiratory specimens during the acute phase of infection. The lowest concentration of SARS-CoV-2 viral copies this assay can detect is 138 copies/mL. A negative result does not preclude SARS-Cov-2 infection and should not be used as the sole basis for treatment or other patient management decisions. A negative result may occur with  improper specimen collection/handling, submission of specimen other than nasopharyngeal swab, presence of viral mutation(s) within the areas targeted by this assay, and inadequate number of viral copies(<138 copies/mL). A negative result must be combined with clinical observations, patient history, and epidemiological information. The expected result is Negative.  Fact Sheet for Patients:  EntrepreneurPulse.com.au  Fact Sheet for Healthcare Providers:  IncredibleEmployment.be  This test is no t yet approved or cleared by the [place] FDA and  has been authorized for detection and/or diagnosis of SARS-CoV-2 by FDA under an Emergency Use Authorization (EUA). This EUA will remain  in effect (meaning this test can be used) for the duration of the COVID-19 declaration under Section 564(b)(1) of the Act, 21 U.S.C.section 360bbb-3(b)(1), unless the authorization is terminated  or revoked sooner.       Influenza A by PCR NEGATIVE NEGATIVE Final    Influenza B by PCR NEGATIVE NEGATIVE Final    Comment: (NOTE) The Xpert Xpress SARS-CoV-2/FLU/RSV plus assay is intended as an aid in the diagnosis of influenza from Nasopharyngeal swab specimens and should not be used as a sole basis for treatment. Nasal washings and aspirates are unacceptable for Xpert Xpress SARS-CoV-2/FLU/RSV testing.  Fact Sheet for Patients: EntrepreneurPulse.com.au  Fact Sheet for Healthcare Providers: IncredibleEmployment.be  This test is not yet approved or cleared by the [place] FDA and has  been authorized for detection and/or diagnosis of SARS-CoV-2 by FDA under an Emergency Use Authorization (EUA). This EUA will remain in effect (meaning this test can be used) for the duration of the COVID-19 declaration under Section 564(b)(1) of the Act, 21 U.S.C. section 360bbb-3(b)(1), unless the authorization is terminated or revoked.  Performed at [facility], [address] [place], [place] [address]      Radiological Exams on Admission: No results found.  EKG: Independently reviewed.  Normal sinus rhythm.  Assessment/Plan Principal Problem:   Acute renal failure superimposed on stage 4 chronic kidney disease (HCC) Active Problems:   Uncontrolled hypertension   History of ovarian cancer   Hypertensive urgency   Chronic combined systolic and diastolic heart failure (HCC)   Mixed hyperlipidemia   ARF (acute renal failure) (HCC)    Acute on chronic disease stage IV creatinine worsened from 2.1 and it is around 3.7 over the last 3 weeks.  Denies using any NSAIDs nausea vomiting or diarrhea.  Did feel dizzy at times.  We will hold patient's lisinopril gently hydrate follow metabolic panel. Possible UTI on ceftriaxone.  Follow urine cultures. History of dizziness has been on and off for many months now.  Check orthostatics.  Physical therapy consult. Memory issues has followed up with neurologist  recently at [facility].  Neurologist is planning to get MRI brain and other work-up.  Patient does get agitated but refused to use Zyprexa which was prescribed. Prior history of ovarian cancer. Nonischemic cardiomyopathy appears compensated.  Last EF was 55 to 60% with grade 1 diastolic dysfunction.  Presently receiving fluids for acute renal failure. Diabetes mellitus type 2 last hemoglobin A1c 3 weeks ago was 5.7.  On sliding scale coverage. Chronic anemia likely from renal disease follow CBC. Recently admitted for small bowel obstruction has no symptoms at this time.  Since patient is acute on chronic kidney disease with possible UTI will admit for inpatient.   DVT prophylaxis: Heparin. Code Status: Full code. Family Communication: Patient's sister. Disposition Plan: Home. Consults called: None. Admission status: Inpatient.   Rise Patience MD [facility] Pager [phone].  If 7PM-7AM, please contact night-coverage [email or web address] Password [facility]  10/14/2021, 9:59 PM

## 2021-10-14 NOTE — Progress Notes (Signed)
Patient became verbally aggressive, swearing when attempting to administer heparin and ivf.  She continued to swear at staff after education attempts on need for heparin and ivf.

## 2021-10-14 NOTE — ED Notes (Addendum)
Pt refused to use the potty chair in the room. Pt insisted on using the restroom. Pt amb to restroom and back to room. Pt refused to lie back in bed and insist on setting on the end of the bed. Vitals taken. Pt is offered something to eat and drink but she refused.

## 2021-10-15 LAB — COMPREHENSIVE METABOLIC PANEL
ALT: 25 U/L (ref 0–44)
AST: 26 U/L (ref 15–41)
Albumin: 3 g/dL — ABNORMAL LOW (ref 3.5–5.0)
Alkaline Phosphatase: 48 U/L (ref 38–126)
Anion gap: 12 (ref 5–15)
BUN: 48 mg/dL — ABNORMAL HIGH (ref 8–23)
CO2: 25 mmol/L (ref 22–32)
Calcium: 9 mg/dL (ref 8.9–10.3)
Chloride: 107 mmol/L (ref 98–111)
Creatinine, Ser: 3.16 mg/dL — ABNORMAL HIGH (ref 0.44–1.00)
GFR, Estimated: 15 mL/min — ABNORMAL LOW (ref >60)
Glucose, Bld: 166 mg/dL — ABNORMAL HIGH (ref 70–99)
Potassium: 4.4 mmol/L (ref 3.5–5.1)
Sodium: 144 mmol/L (ref 135–145)
Total Bilirubin: 0.3 mg/dL (ref 0.3–1.2)
Total Protein: 6.2 g/dL — ABNORMAL LOW (ref 6.5–8.1)

## 2021-10-15 LAB — CBC
HCT: 29 % — ABNORMAL LOW (ref 36.0–46.0)
Hemoglobin: 9.7 g/dL — ABNORMAL LOW (ref 12.0–15.0)
MCH: 29.7 pg (ref 26.0–34.0)
MCHC: 33.4 g/dL (ref 30.0–36.0)
MCV: 88.7 fL (ref 80.0–100.0)
Platelets: 271 10*3/uL (ref 150–400)
RBC: 3.27 MIL/uL — ABNORMAL LOW (ref 3.87–5.11)
RDW: 13 % (ref 11.5–15.5)
WBC: 6.4 10*3/uL (ref 4.0–10.5)
nRBC: 0 % (ref 0.0–0.2)

## 2021-10-15 LAB — GLUCOSE, CAPILLARY: Glucose-Capillary: 112 mg/dL — ABNORMAL HIGH (ref 70–99)

## 2021-10-15 MED ORDER — SODIUM CHLORIDE 0.9 % IV SOLN: 1.0000 g | INTRAVENOUS | Status: DC

## 2021-10-15 NOTE — TOC Progression Note (Addendum)
Transition of Care [facility]) - Progression Note    Patient Details  Name: Teresa Poole MRN: [id number] Date of Birth: 21-Mar-1951  Transition of [facility]) CM/SW Contact  Zenon Mayo, RN Phone Number: 10/15/2021, 8:18 AM  Clinical Narrative:    From home alone, here with Uncontrolled hypertension,History of ovarian cancer, HTN urgency, chronic combined sys and diast HF , Acute renal failure/CKD, poss UTI, conts on IVF's and IV abx. TOC will continue to follow for dc needs.          Expected Discharge Plan and Services                                                 Social Determinants of Health (SDOH) Interventions    Readmission Risk Interventions No flowsheet data found.

## 2021-10-15 NOTE — Progress Notes (Signed)
Patient pushed her call light and stated her arm was bleeding. Staff came into patient's room and found that patient had pulled her PIV out. RN put a bandage on patient's arm, patient pulled the bandage off. Patient's left arm PIV site has stopped bleeding. Patient agitated and states she is getting out of here. Patient took off her telemetry monitor and is ready to leave hospital. MD notified via secure chat. Wyland Rastetter Loel Ro

## 2021-10-17 ENCOUNTER — Other Ambulatory Visit: Payer: Self-pay

## 2021-10-17 ENCOUNTER — Emergency Department (HOSPITAL_COMMUNITY)
Admission: EM | Admit: 2021-10-17 | Discharge: 2021-10-17 | Disposition: A | Discharge status: Home / Self Care | Payer: Medicare PPO | Attending: Emergency Medicine | Admitting: Emergency Medicine

## 2021-10-17 DIAGNOSIS — I5042 Chronic combined systolic (congestive) and diastolic (congestive) heart failure: Secondary | ICD-10-CM | POA: Insufficient documentation

## 2021-10-17 DIAGNOSIS — Y900 Blood alcohol level of less than 20 mg/100 ml: Secondary | ICD-10-CM | POA: Insufficient documentation

## 2021-10-17 DIAGNOSIS — Z046 Encounter for general psychiatric examination, requested by authority: Secondary | ICD-10-CM | POA: Diagnosis present

## 2021-10-17 DIAGNOSIS — Z79899 Other long term (current) drug therapy: Secondary | ICD-10-CM | POA: Diagnosis not present

## 2021-10-17 DIAGNOSIS — I2510 Atherosclerotic heart disease of native coronary artery without angina pectoris: Secondary | ICD-10-CM | POA: Diagnosis not present

## 2021-10-17 DIAGNOSIS — N184 Chronic kidney disease, stage 4 (severe): Secondary | ICD-10-CM | POA: Diagnosis not present

## 2021-10-17 DIAGNOSIS — R45851 Suicidal ideations: Secondary | ICD-10-CM | POA: Diagnosis not present

## 2021-10-17 DIAGNOSIS — Z951 Presence of aortocoronary bypass graft: Secondary | ICD-10-CM | POA: Diagnosis not present

## 2021-10-17 DIAGNOSIS — R451 Restlessness and agitation: Secondary | ICD-10-CM | POA: Insufficient documentation

## 2021-10-17 DIAGNOSIS — E1122 Type 2 diabetes mellitus with diabetic chronic kidney disease: Secondary | ICD-10-CM | POA: Diagnosis not present

## 2021-10-17 DIAGNOSIS — Z20822 Contact with and (suspected) exposure to covid-19: Secondary | ICD-10-CM | POA: Insufficient documentation

## 2021-10-17 DIAGNOSIS — I130 Hypertensive heart and chronic kidney disease with heart failure and stage 1 through stage 4 chronic kidney disease, or unspecified chronic kidney disease: Secondary | ICD-10-CM | POA: Insufficient documentation

## 2021-10-17 LAB — URINALYSIS, ROUTINE W REFLEX MICROSCOPIC
Bilirubin Urine: NEGATIVE
Glucose, UA: NEGATIVE mg/dL
Hgb urine dipstick: NEGATIVE
Ketones, ur: NEGATIVE mg/dL
Nitrite: NEGATIVE
Protein, ur: 100 mg/dL — AB
Specific Gravity, Urine: 1.015 (ref 1.005–1.030)
pH: 6 (ref 5.0–8.0)

## 2021-10-17 LAB — COMPREHENSIVE METABOLIC PANEL
ALT: 26 U/L (ref 0–44)
AST: 29 U/L (ref 15–41)
Albumin: 3.3 g/dL — ABNORMAL LOW (ref 3.5–5.0)
Alkaline Phosphatase: 53 U/L (ref 38–126)
Anion gap: 9 (ref 5–15)
BUN: 44 mg/dL — ABNORMAL HIGH (ref 8–23)
CO2: 23 mmol/L (ref 22–32)
Calcium: 9 mg/dL (ref 8.9–10.3)
Chloride: 110 mmol/L (ref 98–111)
Creatinine, Ser: 2.8 mg/dL — ABNORMAL HIGH (ref 0.44–1.00)
GFR, Estimated: 18 mL/min — ABNORMAL LOW (ref >60)
Glucose, Bld: 129 mg/dL — ABNORMAL HIGH (ref 70–99)
Potassium: 4.4 mmol/L (ref 3.5–5.1)
Sodium: 142 mmol/L (ref 135–145)
Total Bilirubin: 0.4 mg/dL (ref 0.3–1.2)
Total Protein: 6.3 g/dL — ABNORMAL LOW (ref 6.5–8.1)

## 2021-10-17 LAB — CBC WITH DIFFERENTIAL/PLATELET
Abs Immature Granulocytes: 0.01 10*3/uL (ref 0.00–0.07)
Basophils Absolute: 0 10*3/uL (ref 0.0–0.1)
Basophils Relative: 0 %
Eosinophils Absolute: 0.1 10*3/uL (ref 0.0–0.5)
Eosinophils Relative: 2 %
HCT: 29 % — ABNORMAL LOW (ref 36.0–46.0)
Hemoglobin: 9.5 g/dL — ABNORMAL LOW (ref 12.0–15.0)
Immature Granulocytes: 0 %
Lymphocytes Relative: 43 %
Lymphs Abs: 2.7 10*3/uL (ref 0.7–4.0)
MCH: 29.7 pg (ref 26.0–34.0)
MCHC: 32.8 g/dL (ref 30.0–36.0)
MCV: 90.6 fL (ref 80.0–100.0)
Monocytes Absolute: 0.4 10*3/uL (ref 0.1–1.0)
Monocytes Relative: 6 %
Neutro Abs: 3 10*3/uL (ref 1.7–7.7)
Neutrophils Relative %: 49 %
Platelets: 261 10*3/uL (ref 150–400)
RBC: 3.2 MIL/uL — ABNORMAL LOW (ref 3.87–5.11)
RDW: 13.2 % (ref 11.5–15.5)
WBC: 6.3 10*3/uL (ref 4.0–10.5)
nRBC: 0 % (ref 0.0–0.2)

## 2021-10-17 LAB — URINALYSIS, MICROSCOPIC (REFLEX): RBC / HPF: NONE SEEN RBC/hpf (ref 0–5)

## 2021-10-17 LAB — URINE CULTURE: Culture: >=100000 — AB

## 2021-10-17 LAB — RAPID URINE DRUG SCREEN, HOSP PERFORMED
Amphetamines: NOT DETECTED
Barbiturates: NOT DETECTED
Benzodiazepines: NOT DETECTED
Cocaine: NOT DETECTED
Opiates: NOT DETECTED
Tetrahydrocannabinol: NOT DETECTED

## 2021-10-17 LAB — RESP PANEL BY RT-PCR (FLU A&B, COVID) ARPGX2
Influenza A by PCR: NEGATIVE
Influenza B by PCR: NEGATIVE
SARS Coronavirus 2 by RT PCR: NEGATIVE

## 2021-10-17 LAB — ETHANOL: Alcohol, Ethyl (B): <10 mg/dL (ref <10)

## 2021-10-17 MED ORDER — NIFEDIPINE ER OSMOTIC RELEASE 60 MG PO TB24
60.0000 mg | ORAL_TABLET | Freq: Every day | ORAL | Status: DC
  Administered 2021-10-17: 60 mg via ORAL
  Filled 2021-10-17: qty 1

## 2021-10-17 MED ORDER — GLUCOSE BLOOD VI STRP: 1.0000 | ORAL_STRIP | Status: DC

## 2021-10-17 MED ORDER — LORAZEPAM 2 MG/ML IJ SOLN
1.0000 mg | Freq: Once | INTRAMUSCULAR | Status: AC
  Administered 2021-10-17: 1 mg via INTRAMUSCULAR
  Filled 2021-10-17: qty 1

## 2021-10-17 MED ORDER — LISINOPRIL 20 MG PO TABS
40.0000 mg | ORAL_TABLET | Freq: Every day | ORAL | Status: DC
  Administered 2021-10-17: 40 mg via ORAL
  Filled 2021-10-17: qty 2

## 2021-10-17 MED ORDER — NIFEDIPINE ER OSMOTIC RELEASE 60 MG PO TB24: 60.0000 mg | ORAL_TABLET | Freq: Once | ORAL | Status: DC

## 2021-10-17 MED ORDER — CARVEDILOL 12.5 MG PO TABS
25.0000 mg | ORAL_TABLET | Freq: Two times a day (BID) | ORAL | Status: DC
  Administered 2021-10-17: 25 mg via ORAL
  Filled 2021-10-17: qty 2

## 2021-10-17 NOTE — ED Provider Notes (Signed)
Patient medically cleared by previous provider team, psychiatry also psych cleared this patient.  Patient does not require further emergency services at this time and will be discharged home.     Teressa Lower, MD 10/17/21 [phone]

## 2021-10-17 NOTE — ED Triage Notes (Signed)
Pt brought to ED by [employer] with for psychiatric evaluation. [employer] reports being called out after pt threatened to "take all her pills to kill herself". Pt noted to have mood swings during triage. States that she "woke up talking to herself about dying, and she's never done that before". Also endorses suicidal ideation since she was 71 years old.

## 2021-10-17 NOTE — ED Provider Notes (Signed)
[facility] EMERGENCY DEPARTMENT Provider Note   CSN: [id number] Arrival date & time: 10/17/21  0004     History  Chief Complaint  Patient presents with   Psychiatric Evaluation    Teresa Poole is a 71 y.o. female.  The history is provided by the patient, a relative, medical records and the EMS personnel. The history is limited by the condition of the patient.   Patient with medical history notable for chronic kidney disease stage IV, nonischemic cardiomyopathy last EF was in 2022 was 50 to 60% with grade 1 diastolic dysfunction, hypertension, diabetes mellitus, chronic anemia presents due to need for psychiatric evaluation.  Police were called, patient's sister states she was threatening suicide.  Patient was acting agitated, family concerned.  She has not under IVC. Should be on Zyprexa due to memory concerns but has not been taking.  Patient lives on her own, unclear if she has been taking her medicine since being discharged from the hospital.  Notably, patient was recently admitted in early February for small bowel obstruction.  She was again admitted 2/21 due to AKI, suspected UTI, and altered mental status.  She was discharged 10/15/2021 from [facility].   Home Medications Prior to Admission medications   Medication Sig Start Date End Date Taking? Authorizing Provider  carvedilol (COREG) 25 MG tablet Take 25 mg by mouth 2 (two) times daily with a meal.    [provider]  cycloSPORINE, PF, (CEQUA) 0.09 % SOLN Place 1 drop into both eyes in the morning and at bedtime. 12/18/20   [provider]  ezetimibe (ZETIA) 10 MG tablet Take 10 mg by mouth daily. 06/09/21   [provider]  famotidine (PEPCID) 40 MG tablet Take 40 mg by mouth daily. 09/04/21   [provider]  furosemide (LASIX) 20 MG tablet Take 20 mg by mouth daily. 09/17/21   [provider]  glucose blood (ONE TOUCH ULTRA TEST) test strip Use  to check blood sugar 3 times daily Dx code E11.65 Patient taking differently: 1 each by Other route See admin instructions. 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 09/17/21   [provider]  indapamide (LOZOL) 2.5 MG tablet Take 2.5 mg by mouth daily. 09/22/21   [provider]  lisinopril (PRINIVIL,ZESTRIL) 40 MG tablet Take 1 tablet (40 mg total) by mouth daily. 10/10/18   Copland, Gay Filler, MD  NIFEdipine (ADALAT CC) 60 MG 24 hr tablet Take 1 tablet (60 mg total) by mouth daily. 07/09/21   Daleen Bo, MD  OLANZapine zydis (ZYPREXA) 5 MG disintegrating tablet Take 1 tablet (5 mg total) by mouth daily. 09/28/21   Eugenie Filler, MD  omeprazole (PRILOSEC) 40 MG capsule Take 40 mg by mouth daily. 06/08/21   [provider]  ONETOUCH VERIO test strip [place] Patient taking differently: 1 each by Other route in the morning, at noon, and at bedtime. 03/30/18   Elayne Snare, MD  OZEMPIC, 0.25 OR 0.5 MG/DOSE, 2 MG/1.5ML SOPN Inject 0.25 mg into the skin once a week. Sundays 05/12/21   [provider]  polyethylene glycol (MIRALAX / GLYCOLAX) 17 g packet Take 17 g by mouth daily. 09/28/21   Eugenie Filler, MD  sucralfate (CARAFATE) 1 GM/10ML suspension Take 0.5 g by mouth  in the morning, at noon, in the evening, and at bedtime. 09/14/21   [provider]      Allergies    Hydralazine hcl and Atorvastatin    Review of Systems   Review of Systems  Physical Exam Updated Vital Signs BP (!) 185/69 (BP Location: Left Arm)    Pulse 77    Temp 98.2 F (36.8 C) (Oral)    Resp 17    SpO2 97%  Physical Exam Vitals and nursing note reviewed. Exam conducted with a chaperone present.  Constitutional:      General: She is not in acute distress.    Appearance: Normal appearance.  HENT:     Head:  Normocephalic and atraumatic.  Eyes:     General: No scleral icterus.    Extraocular Movements: Extraocular movements intact.     Pupils: Pupils are equal, round, and reactive to light.  Cardiovascular:     Rate and Rhythm: Normal rate and regular rhythm.     Pulses: Normal pulses.     Heart sounds: Normal heart sounds.  Abdominal:     General: Abdomen is flat.     Tenderness: There is no abdominal tenderness.  Skin:    Coloration: Skin is not jaundiced.  Neurological:     Mental Status: She is alert. Mental status is at baseline.     Coordination: Coordination normal.  Psychiatric:        Attention and Perception: She is inattentive.        Mood and Affect: Affect is labile and blunt.        Speech: She is noncommunicative.        Behavior: Behavior is uncooperative and withdrawn.     Comments: Patient is flat and noncommunicative with me in the room.  She is combative and agitated with the nurses when asked to take off her clothes and put on the scrubs.    ED Results / Procedures / Treatments   Labs (all labs ordered are listed, but only abnormal results are displayed) Labs Reviewed  COMPREHENSIVE METABOLIC PANEL - Abnormal; Notable for the following components:      Result Value   Glucose, Bld 129 (*)    BUN 44 (*)    Creatinine, Ser 2.80 (*)    Total Protein 6.3 (*)    Albumin 3.3 (*)    GFR, Estimated 18 (*)    All other components within normal limits  CBC WITH DIFFERENTIAL/PLATELET - Abnormal; Notable for the following components:   RBC 3.20 (*)    Hemoglobin 9.5 (*)    HCT 29.0 (*)    All other components within normal limits  URINALYSIS, ROUTINE W REFLEX MICROSCOPIC - Abnormal; Notable for the following components:   Protein, ur 100 (*)    Leukocytes,Ua TRACE (*)    All other components within normal limits  URINALYSIS, MICROSCOPIC (REFLEX) - Abnormal; Notable for the following components:   Bacteria, UA RARE (*)    All other components within normal limits   RESP PANEL BY RT-PCR (FLU A&B, COVID) ARPGX2  URINE CULTURE  ETHANOL  RAPID URINE DRUG SCREEN, HOSP PERFORMED    EKG None  Radiology No results found.  Procedures Procedures    Medications Ordered in ED Medications  glucose blood test strip STRP 1 each (has no administration in time range)    ED Course/ Medical Decision Making/ A&P  Medical Decision Making Suicidal ideations, psychiatric evaluation  Amount and/or Complexity of Data Reviewed Independent Historian: friend and EMS    Details: Please see HPI External Data Reviewed: labs, radiology, ECG and notes.    Details: Documented in HPI and further in ED course. Labs: ordered. Decision-making details documented in ED Course.  Risk OTC drugs. Prescription drug management. Decision regarding hospitalization.   71 year old female presenting due to need for psychiatric evaluation.  Differential diagnosis includes medical noncompliance, metabolic derangement, neurologic emergency, underlying infectious source  I ordered, viewed and personally interpreted the lab results.  Per my interpretation patient has no gross electrolyte derangement, she is mildly hyperglycemic with a elevated creatinine at 2.8 this is roughly at baseline compared to time of discharge.  Stable anemia on CBC without gross change from prior.  There is proteinuria and trace leukocytes in urine, she was recently treated for UTI and does not appear to have any nitrites.  Urine culture ordered to better evaluate for resolution of suspected UTI from previous hospitalization.   Additional history obtained: -Additional history obtained from sister, EMS, and chart review of hospitalist D/C and admit note.  -External records from outside source obtained and reviewed including: Chart review including previous notes, labs, imaging, consultation notes   EKG -Pending  Medicines ordered and prescription drug management: -I ordered  form tach to reconcile patient's home medicine.  At this time it is unclear which medicine patient is currently taking.  I did review her discharge medicines from [facility], will reorder those for now.  She does have some issues with medical noncompliance, she should be taking Zyprexa which is initially started medicine by neurology few weeks ago. -Reevaluation of the patient after these medicines showed that the patient stayed the same -I have reviewed the patients home medicines and have made adjustments as needed   Consultations Obtained: I requested consultation with the TTS,  and discussed lab and imaging findings as well as pertinent plan - they recommend: TTS evaluation   ED Course: Patient presented via [facility] due to concerns for suicidal ideations with plan.  Patient told her sister she was planning to overdose on her home medicine, no previous attempts per sister.  Patient had 2 recent admissions, on exam she is not hypokalemic, does not appear septic.  No evidence of SBO.  Abdomen is soft, do not feel any additional work-up regarding metabolic etiology is warranted.  Patient is cooperative, albeit withdrawn.  At this time I feel she is medically cleared.  TTS consult pending.    Cardiac Monitoring: The patient was maintained on a cardiac monitor.  I personally viewed and interpreted the cardiac monitored which showed an underlying rhythm of: NSR, HR 77   Reevaluation: After the interventions noted above, I reevaluated the patient and found that they have :stayed the same   Dispostion: -Admit  -Patient is voluntary -Med rec's pending.  I did reorder glucose testing strips but unsure which medicines patient is supposed to be taking versus which are stored meds.  Will obtain from ordering additional medication until fully reconciled.  We will also obtain concerning patient on Zyprexa at this time.         Final Clinical Impression(s) / ED Diagnoses Final diagnoses:   Suicidal ideation    Rx / DC Orders ED Discharge Orders     None         Sherrill Raring, [place] 10/17/21 8413    Margette Fast, MD 10/23/21 1218

## 2021-10-17 NOTE — Discharge Instructions (Addendum)
Discharge recommendations:  Patient is to take medications as prescribed. Please see information for follow-up appointment with psychiatry and therapy. Please follow up with your primary care provider for all medical related needs.   Therapy: We recommend that patient participate in individual therapy to address mental health concerns.  Medications: The parent/guardian is to contact a medical professional and/or outpatient provider to address any new side effects that develop. Parent/guardian should update outpatient providers of any new medications and/or medication changes.   Atypical antipsychotics: If you are prescribed an atypical antipsychotic, it is recommended that your height, weight, BMI, blood pressure, fasting lipid panel, and fasting blood sugar be monitored by your outpatient providers.  Safety:  The patient should abstain from use of illicit substances/drugs and abuse of any medications. If symptoms worsen or do not continue to improve or if the patient becomes actively suicidal or homicidal then it is recommended that the patient return to the closest hospital emergency department, the [facility], or call 911 for further evaluation and treatment. National Suicide Prevention Lifeline 1-800-SUICIDE or 613-399-3788.  About 988 988 offers 24/7 access to trained crisis counselors who can help people experiencing mental health-related distress. People can call or text 988 or chat 988lifeline.org for themselves or if they are worried about a loved one who may need crisis support.   You are advised to contact [facility] to establish outpatient services with Corena Pilgrim, MD:       Corena Pilgrim, [place]      [address], [place], [place] [address]      [phone]   You are advised to continue follow up with Dr. Shela Leff, Neurologist: [facility] [place]   [phone]

## 2021-10-17 NOTE — ED Notes (Signed)
Pt has intermittent confusion

## 2021-10-17 NOTE — ED Notes (Signed)
Pt lunch tray delivered.

## 2021-10-17 NOTE — BH Assessment (Signed)
[place] Assessment Progress Note   Per Darrol Angel, NP, this pt does not require psychiatric hospitalization at this time.  Pt is psychiatrically cleared.  Discharge instructions include recommendation to continue treatment with Corena Pilgrim, MD.  Sharl Ma has been notified.  Jalene Mullet, [place] Triage Specialist [phone]

## 2021-10-17 NOTE — ED Notes (Signed)
Pt displays several behavioral challenges. Has repeatedly slammed room door in triage, deliberating doing such after being asked by staff not to. Pt also displays difficulty changing out into purple scrubs, yelling at staff and refusing to do so and has thrown clothing across room as if attempting to be contrary with staff requests.

## 2021-10-17 NOTE — Progress Notes (Signed)
CSW spoke with patients sister Jacqlyn Larsen and Elenore Rota. The family stated to send patient back home and they plan on hiring someone to check in and care for patient at home. The family stated they will not be able to allow patient to live with them. CSW was also told they tried to get patient into assisted living before but they have to go through patients neurologist for a formal diagnosis. CSW was told it can take months to get a formal diagnosis. Patients sister stated patient has her key and can get into her home. Family was upset that patient was psych cleared.

## 2021-10-17 NOTE — Consult Note (Signed)
Telepsych Consultation   Reason for Consult:  Psych evaluation  Referring Physician:  Sherrill Raring, PA-C Location of Patient: [facility] Location of Provider: [facility]  Patient Identification: Teresa Poole MRN:  [id number] Principal Diagnosis: Suicidal ideations Diagnosis:  Principal Problem:   Suicidal ideations   Total Time spent with patient: 15 minutes  Subjective:   Teresa Poole is a 71 y.o. female patient admitted to [facility] after she brought in by [facility] for threatening to "take all her pills to kill herself."    HPI:  Patient seen via tele health by this provider; chart reviewed and consulted with Dr. Dwyane Dee on 10/17/21. On evaluation Teresa Poole is sitting up in bed facing the camera with good eye contact. She is alert and oriented to person, place and time. Appears to be confused as to why she was brought to the hospital for an evaluation. Her thought process is logical however she does ruminate a lot throughout the assessment about how she continues to forget things and requires redirecting to stay on topic. Patient speech is clear and coherent.  Patient states, "I have been experiencing events that have happened that are not real as I am talking to you but it did not happen."  She states that yesterday everyone was telling her that she did not pay her bills but she was sure she had paid everyone. She states that she has been forgetting things more frequently. She states, "my reality is that I am in a world that I am not in." She states that she was driving in her car one day and forgot how to get back home and had to look at her driver license and put the address in her gps.   When asked if she made suicidal statements to overdose of her medications, she states no. She denies a hx of suicide attempts. She denies HI. She denies auditory and visual hallucinations and states, "hell no."  There is no objective evidence that the patient is currently responding to internal or  external stimuli or exhibiting delusional or paranoia thought content.   She states that she lives alone. She denies drinking alcohol or using illicit drugs.   I attempted to contact the patient's sister Harneet Noblett three times this morning to establish a safety plan due to patient living alone, forgetful and confused at baseline for discharge, however, no answer.   TOC consult ordered: Patient lives alone. Patient is confused and forgetful at baseline, possible dementia. Please provide patient's sister Jacqlyn Larsen with resources for ASF. I have not been able to contact Shellia Carwin (pt's sister) please assist with safety planning for discharge.  Per chart review: Neuro on 10/10/21  71 y.o. female with PMHx as above who presents for evaluation of forgetfulness cognitive decline. No significant focal findings appreciated on today's exam except for the fact that she is alert and oriented x2 today. This in addition to the emotional lability questionable hallucinations and forgetfulness is concerning for dementia. Localization would include the hippocampal region frontal lobe. Differential diagnosis of dementia would include Alzheimer's in addition to frontal lobe dementia. We will move forward with a brain MRI with and without contrast and lab work. Plan  Cognitive decline: - MRI with and without contrast - RPR - CMP - UA - If work-up comes back negative consider a PET scan  Past Psychiatric History: Adjustment disorder with anxiety. Possible dementia.   Risk to Self:  denies  Risk to Others:  denies  Prior Inpatient Therapy:  denies  Prior Outpatient Therapy:  denies   Past Medical History:  Past Medical History:  Diagnosis Date   Allergy    Anemia    Carotid stenosis    Chronic combined systolic and diastolic CHF (congestive heart failure) (HCC)    CKD (chronic kidney disease), stage IV ([place]) 09/24/2013   Pt at [facility], Dr. Joelyn Oms   Diabetes mellitus type 2 in nonobese [facility])     Edema 09/14/2013   GERD (gastroesophageal reflux disease)    History of ovarian cancer    Hyperlipidemia    Hypertension    Mild CAD    non-obstructive by LHC (09/25/2013): Proximal and mid LAD serial 20%, proximal circumflex 30%, mid AV groove circumflex 30%, mid RCA mild plaque.   NICM (nonischemic cardiomyopathy) (HCC)    Obesity (BMI 30-39.9)    RBBB    Renal artery stenosis ([place]) 10/11/2018   Thyroid disease    Seen by specialist    Past Surgical History:  Procedure Laterality Date   LEFT HEART CATHETERIZATION WITH CORONARY ANGIOGRAM N/A 09/25/2013   Procedure: LEFT HEART CATHETERIZATION WITH CORONARY ANGIOGRAM;  Surgeon: Burnell Blanks, MD;  Location: [facility] CATH LAB;  Service: Cardiovascular;  Laterality: N/A;   RENAL ANGIOGRAPHY N/A 10/06/2018   Procedure: RENAL ANGIOGRAPHY;  Surgeon: Marty Heck, MD;  Location: [facility] CV LAB;  Service: Cardiovascular;  Laterality: N/A;   Family History:  Family History  Problem Relation Age of Onset   Diabetes Father    Hypertension Father    Hypertension Maternal Grandfather    Family Psychiatric  History: per chart review: Pt had cousin murdered as infant (aunt with PPD) Cousins (sons of aunt with PPD) with lifelong mental health struggles Some 3rd and 4th cousins with unknown mental health struggles.  Social History:  Social History   Substance and Sexual Activity  Alcohol Use No     Social History   Substance and Sexual Activity  Drug Use No    Social History   Socioeconomic History   Marital status: Single    Spouse name: Not on file   Number of children: 0   Years of education: Not on file   Highest education level: Not on file  Occupational History    Employer: [employer]  Tobacco Use   Smoking status: Never   Smokeless tobacco: Never  Vaping Use   Vaping Use: Never used  Substance and Sexual Activity   Alcohol use: No   Drug use: No   Sexual activity: Not on file  Other Topics  Concern   Not on file  Social History Narrative   Works at [employer]   Patient lives at home alone.    Patient has no children.    Patient patient is right handed.    Patient is single.    Social Determinants of Health   Financial Resource Strain: Not on file  Food Insecurity: Not on file  Transportation Needs: Not on file  Physical Activity: Not on file  Stress: Not on file  Social Connections: Not on file   Additional Social History:    Allergies:   Allergies  Allergen Reactions   Hydralazine Hcl Itching    ENTIRE BODY = burning sensation, also in the eyes (they have become very sensitive to light)   Atorvastatin Other (See Comments)    Per MD - pt not sure of reaction     Labs:  Results for orders placed or performed during the hospital  encounter of 10/17/21 (from the past 48 hour(s))  Resp Panel by RT-PCR (Flu A&B, Covid) Nasopharyngeal Swab     Status: None   Collection Time: 10/17/21 12:40 AM   Specimen: Nasopharyngeal Swab; Nasopharyngeal(NP) swabs in vial transport medium  Result Value Ref Range   SARS Coronavirus 2 by RT PCR NEGATIVE NEGATIVE    Comment: (NOTE) SARS-CoV-2 target nucleic acids are NOT DETECTED.  The SARS-CoV-2 RNA is generally detectable in upper respiratory specimens during the acute phase of infection. The lowest concentration of SARS-CoV-2 viral copies this assay can detect is 138 copies/mL. A negative result does not preclude SARS-Cov-2 infection and should not be used as the sole basis for treatment or other patient management decisions. A negative result may occur with  improper specimen collection/handling, submission of specimen other than nasopharyngeal swab, presence of viral mutation(s) within the areas targeted by this assay, and inadequate number of viral copies(<138 copies/mL). A negative result must be combined with clinical observations, patient history, and epidemiological information. The expected result is Negative.  Fact  Sheet for Patients:  EntrepreneurPulse.com.au  Fact Sheet for Healthcare Providers:  IncredibleEmployment.be  This test is no t yet approved or cleared by the [place] FDA and  has been authorized for detection and/or diagnosis of SARS-CoV-2 by FDA under an Emergency Use Authorization (EUA). This EUA will remain  in effect (meaning this test can be used) for the duration of the COVID-19 declaration under Section 564(b)(1) of the Act, 21 U.S.C.section 360bbb-3(b)(1), unless the authorization is terminated  or revoked sooner.       Influenza A by PCR NEGATIVE NEGATIVE   Influenza B by PCR NEGATIVE NEGATIVE    Comment: (NOTE) The Xpert Xpress SARS-CoV-2/FLU/RSV plus assay is intended as an aid in the diagnosis of influenza from Nasopharyngeal swab specimens and should not be used as a sole basis for treatment. Nasal washings and aspirates are unacceptable for Xpert Xpress SARS-CoV-2/FLU/RSV testing.  Fact Sheet for Patients: EntrepreneurPulse.com.au  Fact Sheet for Healthcare Providers: IncredibleEmployment.be  This test is not yet approved or cleared by the [place] FDA and has been authorized for detection and/or diagnosis of SARS-CoV-2 by FDA under an Emergency Use Authorization (EUA). This EUA will remain in effect (meaning this test can be used) for the duration of the COVID-19 declaration under Section 564(b)(1) of the Act, 21 U.S.C. section 360bbb-3(b)(1), unless the authorization is terminated or revoked.  Performed at [facility], [place] [address], [place], [place] [address]   Urine rapid drug screen (hosp performed)     Status: None   Collection Time: 10/17/21 12:40 AM  Result Value Ref Range   Opiates NONE DETECTED NONE DETECTED   Cocaine NONE DETECTED NONE DETECTED   Benzodiazepines NONE DETECTED NONE DETECTED   Amphetamines NONE DETECTED NONE DETECTED   Tetrahydrocannabinol  NONE DETECTED NONE DETECTED   Barbiturates NONE DETECTED NONE DETECTED    Comment: (NOTE) DRUG SCREEN FOR MEDICAL PURPOSES ONLY.  IF CONFIRMATION IS NEEDED FOR ANY PURPOSE, NOTIFY LAB WITHIN 5 DAYS.  LOWEST DETECTABLE LIMITS FOR URINE DRUG SCREEN Drug Class                     Cutoff (ng/mL) Amphetamine and metabolites    1000 Barbiturate and metabolites    200 Benzodiazepine                 242 Tricyclics and metabolites     300 Opiates and metabolites  300 Cocaine and metabolites        300 THC                            50 Performed at [facility], [place] [address], [place], [place] [address]   Comprehensive metabolic panel     Status: Abnormal   Collection Time: 10/17/21 12:42 AM  Result Value Ref Range   Sodium 142 135 - 145 mmol/L   Potassium 4.4 3.5 - 5.1 mmol/L   Chloride 110 98 - 111 mmol/L   CO2 23 22 - 32 mmol/L   Glucose, Bld 129 (H) 70 - 99 mg/dL    Comment: Glucose reference range applies only to samples taken after fasting for at least 8 hours.   BUN 44 (H) 8 - 23 mg/dL   Creatinine, Ser 2.80 (H) 0.44 - 1.00 mg/dL   Calcium 9.0 8.9 - 10.3 mg/dL   Total Protein 6.3 (L) 6.5 - 8.1 g/dL   Albumin 3.3 (L) 3.5 - 5.0 g/dL   AST 29 15 - 41 U/L   ALT 26 0 - 44 U/L   Alkaline Phosphatase 53 38 - 126 U/L   Total Bilirubin 0.4 0.3 - 1.2 mg/dL   GFR, Estimated 18 (L) >60 mL/min    Comment: (NOTE) Calculated using the CKD-EPI Creatinine Equation (2021)    Anion gap 9 5 - 15    Comment: Performed at [facility], [place] [address] [place], [place] [address]  Ethanol     Status: None   Collection Time: 10/17/21 12:42 AM  Result Value Ref Range   Alcohol, Ethyl (B) <10 <10 mg/dL    Comment: (NOTE) Lowest detectable limit for serum alcohol is 10 mg/dL.  For medical purposes only. Performed at [facility], [place] [address], [place], [place] [address]   CBC with Diff     Status: Abnormal   Collection Time: 10/17/21 12:42 AM  Result Value Ref  Range   WBC 6.3 4.0 - 10.5 K/uL   RBC 3.20 (L) 3.87 - 5.11 MIL/uL   Hemoglobin 9.5 (L) 12.0 - 15.0 g/dL   HCT 29.0 (L) 36.0 - 46.0 %   MCV 90.6 80.0 - 100.0 fL   MCH 29.7 26.0 - 34.0 pg   MCHC 32.8 30.0 - 36.0 g/dL   RDW 13.2 11.5 - 15.5 %   Platelets 261 150 - 400 K/uL   nRBC 0.0 0.0 - 0.2 %   Neutrophils Relative % 49 %   Neutro Abs 3.0 1.7 - 7.7 K/uL   Lymphocytes Relative 43 %   Lymphs Abs 2.7 0.7 - 4.0 K/uL   Monocytes Relative 6 %   Monocytes Absolute 0.4 0.1 - 1.0 K/uL   Eosinophils Relative 2 %   Eosinophils Absolute 0.1 0.0 - 0.5 K/uL   Basophils Relative 0 %   Basophils Absolute 0.0 0.0 - 0.1 K/uL   Immature Granulocytes 0 %   Abs Immature Granulocytes 0.01 0.00 - 0.07 K/uL    Comment: Performed at [facility], [address], [place],  [address]  Urinalysis, Routine w reflex microscopic     Status: Abnormal   Collection Time: 10/17/21 12:59 AM  Result Value Ref Range   Color, Urine YELLOW YELLOW   APPearance CLEAR CLEAR   Specific Gravity, Urine 1.015 1.005 - 1.030   pH 6.0 5.0 - 8.0   Glucose, UA NEGATIVE NEGATIVE mg/dL  Hgb urine dipstick NEGATIVE NEGATIVE   Bilirubin Urine NEGATIVE NEGATIVE   Ketones, ur NEGATIVE NEGATIVE mg/dL   Protein, ur 100 (A) NEGATIVE mg/dL   Nitrite NEGATIVE NEGATIVE   Leukocytes,Ua TRACE (A) NEGATIVE    Comment: Performed at [facility] [address], [place], [place] [address]  Urinalysis, Microscopic (reflex)     Status: Abnormal   Collection Time: 10/17/21 12:59 AM  Result Value Ref Range   RBC / HPF NONE SEEN 0 - 5 RBC/hpf   WBC, UA 0-5 0 - 5 WBC/hpf   Bacteria, UA RARE (A) NONE SEEN   Squamous Epithelial / LPF 0-5 0 - 5    Comment: Performed at [facility], [place] [address] [place], [place] [address]    Medications:  Current Facility-Administered Medications  Medication Dose Route Frequency Provider Last Rate Last Admin   carvedilol (COREG) tablet 25 mg  25 mg Oral BID WC Wynona Dove A, DO    25 mg at 10/17/21 0846   lisinopril (ZESTRIL) tablet 40 mg  40 mg Oral Daily Wynona Dove A, DO   40 mg at 10/17/21 0846   NIFEdipine (PROCARDIA XL/NIFEDICAL XL) 24 hr tablet 60 mg  60 mg Oral Daily Wynona Dove A, DO   60 mg at 10/17/21 4665   Current Outpatient Medications  Medication Sig Dispense Refill   carvedilol (COREG) 25 MG tablet Take 25 mg by mouth 2 (two) times daily with a meal.     cycloSPORINE, PF, (CEQUA) 0.09 % SOLN Place 1 drop into both eyes in the morning and at bedtime.     ezetimibe (ZETIA) 10 MG tablet Take 10 mg by mouth daily.     famotidine (PEPCID) 40 MG tablet Take 40 mg by mouth daily.     furosemide (LASIX) 20 MG tablet Take 20 mg by mouth daily.     glucose blood (ONE TOUCH ULTRA TEST) test strip Use to check blood sugar 3 times daily Dx code E11.65 (Patient taking differently: 1 each by Other route See admin instructions. Use to check blood sugar 3 times daily Dx code E11.65) 100 each 3   hydrOXYzine (ATARAX) 10 MG tablet Take 10 mg by mouth 3 (three) times daily as needed for anxiety.     indapamide (LOZOL) 2.5 MG tablet Take 2.5 mg by mouth daily.     lisinopril (PRINIVIL,ZESTRIL) 40 MG tablet Take 1 tablet (40 mg total) by mouth daily. 30 tablet 0   NIFEdipine (ADALAT CC) 60 MG 24 hr tablet Take 1 tablet (60 mg total) by mouth daily. 1 tablet 0   OLANZapine zydis (ZYPREXA) 5 MG disintegrating tablet Take 1 tablet (5 mg total) by mouth daily. 30 tablet 1   omeprazole (PRILOSEC) 40 MG capsule Take 40 mg by mouth daily.     ONETOUCH VERIO test strip CHECK BLOOD SUGAR THREE TIMES DAILY (Patient taking differently: 1 each by Other route in the morning, at noon, and at bedtime.) 200 each 0   OZEMPIC, 0.25 OR 0.5 MG/DOSE, 2 MG/1.5ML SOPN Inject 0.25 mg into the skin once a week. Sundays     polyethylene glycol (MIRALAX / GLYCOLAX) 17 g packet Take 17 g by mouth daily. 14 each 0   sucralfate (CARAFATE) 1 GM/10ML suspension Take 0.5 g by mouth in the morning, at noon,  in the evening, and at bedtime.      Psychiatric Specialty Exam:  Presentation  General Appearance: Appropriate for Environment  Eye Contact:Fair  Speech:Clear and  Coherent  Speech Volume:Normal  Handedness:No data recorded  Mood and Affect  Mood:Anxious; Irritable  Affect:Congruent   Thought Process  Thought Processes:Coherent  Descriptions of Associations:No data recorded Orientation:Full (Time, Place and Person)  Thought Content:Logical; Rumination  History of Schizophrenia/Schizoaffective disorder:No data recorded Duration of Psychotic Symptoms:No data recorded Hallucinations:Hallucinations: None  Ideas of Reference:None  Suicidal Thoughts:Suicidal Thoughts: No  Homicidal Thoughts:Homicidal Thoughts: No   Sensorium  Memory:Immediate Fair; Recent Fair; Remote Fair  Judgment:Intact  Insight:Present   Executive Functions  Concentration:Fair  Attention Span:Fair  [place]   Psychomotor Activity  Psychomotor Activity:Psychomotor Activity: Normal   Assets  Assets:Communication Skills; Housing; Catering manager; Social Support   Sleep  Sleep:Sleep: Fair    Physical Exam: Physical Exam Constitutional:      Appearance: Normal appearance.  HENT:     Head: Normocephalic.  Cardiovascular:     Rate and Rhythm: Normal rate.  Pulmonary:     Effort: Pulmonary effort is normal.  Neurological:     Mental Status: She is alert.   Review of Systems  Constitutional: Negative.   HENT: Negative.    Eyes: Negative.   Respiratory: Negative.    Cardiovascular: Negative.   Gastrointestinal: Negative.   Genitourinary: Negative.   Musculoskeletal: Negative.   Skin: Negative.   Neurological: Negative.   Endo/Heme/Allergies: Negative.   Blood pressure (!) 189/84, pulse 79, temperature 97.6 F (36.4 C), resp. rate 19, SpO2 100 %. There is no height or weight on file to calculate  BMI.  Treatment Plan Summary: Follow up with Neurology due to possible concerns for dementia.  Follow up with Neuropsychiatrics for outpatient psychiatry. Safety plan with Shellia Carwin (pt's sister) prior to discharge.  TOC consult ordered.   Disposition: Patient does not meet criteria for psychiatric inpatient admission. Supportive therapy provided about ongoing stressors. Discussed crisis plan, support from social network, calling 911, coming to the Emergency Department, and calling Suicide Hotline.  This service was provided via telemedicine using a 2-way, interactive audio and video technology.  Names of all persons participating in this telemedicine service and their role in this encounter. Name: Earna Coder  Role: Patient   Name: Darrol Angel  Role: NP  Name:  Role:   Name:  Role:    A secure chat sent to Dr. Matilde Sprang and Arnell Sieving, RN., with stated plan of care and dispo.   Barbara Ahart L, NP 10/17/2021 10:45 AM

## 2021-10-17 NOTE — Progress Notes (Signed)
VM left with patients sister. Patient does not have medicaid on her chart and family would have to pay out of pocket for assisted living.

## 2021-10-18 LAB — URINE CULTURE

## 2021-10-23 NOTE — Discharge Summary (Signed)
Patient left AMA before she could be seen or evaluated by me this morning, please see her H&P by Dr. Hal Hope from last night 2/21 for details  Domenic Polite, MD

## 2021-10-24 DIAGNOSIS — U071 COVID-19: Secondary | ICD-10-CM

## 2021-10-24 HISTORY — DX: COVID-19: U07.1

## 2021-10-25 ENCOUNTER — Inpatient Hospital Stay (HOSPITAL_COMMUNITY): Payer: Medicare PPO

## 2021-10-25 ENCOUNTER — Encounter (HOSPITAL_BASED_OUTPATIENT_CLINIC_OR_DEPARTMENT_OTHER): Payer: Self-pay

## 2021-10-25 ENCOUNTER — Inpatient Hospital Stay (HOSPITAL_BASED_OUTPATIENT_CLINIC_OR_DEPARTMENT_OTHER)
Admission: EM | Admit: 2021-10-25 | Discharge: 2021-10-28 | DRG: 689 | Disposition: A | Discharge status: Home / Self Care | Payer: Medicare PPO | Attending: Family Medicine | Admitting: Family Medicine

## 2021-10-25 ENCOUNTER — Emergency Department (HOSPITAL_BASED_OUTPATIENT_CLINIC_OR_DEPARTMENT_OTHER): Payer: Medicare PPO

## 2021-10-25 ENCOUNTER — Other Ambulatory Visit: Payer: Self-pay

## 2021-10-25 DIAGNOSIS — N390 Urinary tract infection, site not specified: Secondary | ICD-10-CM

## 2021-10-25 DIAGNOSIS — N179 Acute kidney failure, unspecified: Secondary | ICD-10-CM | POA: Diagnosis present

## 2021-10-25 DIAGNOSIS — K219 Gastro-esophageal reflux disease without esophagitis: Secondary | ICD-10-CM | POA: Diagnosis present

## 2021-10-25 DIAGNOSIS — E871 Hypo-osmolality and hyponatremia: Secondary | ICD-10-CM | POA: Diagnosis present

## 2021-10-25 DIAGNOSIS — I428 Other cardiomyopathies: Secondary | ICD-10-CM | POA: Diagnosis present

## 2021-10-25 DIAGNOSIS — E1122 Type 2 diabetes mellitus with diabetic chronic kidney disease: Secondary | ICD-10-CM | POA: Diagnosis present

## 2021-10-25 DIAGNOSIS — D631 Anemia in chronic kidney disease: Secondary | ICD-10-CM | POA: Diagnosis present

## 2021-10-25 DIAGNOSIS — Z8543 Personal history of malignant neoplasm of ovary: Secondary | ICD-10-CM

## 2021-10-25 DIAGNOSIS — Z7985 Long-term (current) use of injectable non-insulin antidiabetic drugs: Secondary | ICD-10-CM | POA: Diagnosis not present

## 2021-10-25 DIAGNOSIS — E860 Dehydration: Secondary | ICD-10-CM | POA: Diagnosis present

## 2021-10-25 DIAGNOSIS — U071 COVID-19: Secondary | ICD-10-CM | POA: Diagnosis present

## 2021-10-25 DIAGNOSIS — N3000 Acute cystitis without hematuria: Secondary | ICD-10-CM | POA: Diagnosis present

## 2021-10-25 DIAGNOSIS — E1169 Type 2 diabetes mellitus with other specified complication: Secondary | ICD-10-CM | POA: Diagnosis present

## 2021-10-25 DIAGNOSIS — Z833 Family history of diabetes mellitus: Secondary | ICD-10-CM | POA: Diagnosis not present

## 2021-10-25 DIAGNOSIS — Z888 Allergy status to other drugs, medicaments and biological substances status: Secondary | ICD-10-CM

## 2021-10-25 DIAGNOSIS — I2510 Atherosclerotic heart disease of native coronary artery without angina pectoris: Secondary | ICD-10-CM | POA: Diagnosis present

## 2021-10-25 DIAGNOSIS — I152 Hypertension secondary to endocrine disorders: Secondary | ICD-10-CM | POA: Diagnosis present

## 2021-10-25 DIAGNOSIS — D649 Anemia, unspecified: Secondary | ICD-10-CM

## 2021-10-25 DIAGNOSIS — F309 Manic episode, unspecified: Secondary | ICD-10-CM

## 2021-10-25 DIAGNOSIS — Z79899 Other long term (current) drug therapy: Secondary | ICD-10-CM

## 2021-10-25 DIAGNOSIS — I10 Essential (primary) hypertension: Secondary | ICD-10-CM | POA: Diagnosis present

## 2021-10-25 DIAGNOSIS — R0789 Other chest pain: Secondary | ICD-10-CM

## 2021-10-25 DIAGNOSIS — Z8249 Family history of ischemic heart disease and other diseases of the circulatory system: Secondary | ICD-10-CM

## 2021-10-25 DIAGNOSIS — F4322 Adjustment disorder with anxiety: Secondary | ICD-10-CM | POA: Diagnosis present

## 2021-10-25 DIAGNOSIS — N184 Chronic kidney disease, stage 4 (severe): Secondary | ICD-10-CM | POA: Diagnosis present

## 2021-10-25 DIAGNOSIS — I5032 Chronic diastolic (congestive) heart failure: Secondary | ICD-10-CM | POA: Diagnosis present

## 2021-10-25 DIAGNOSIS — I5042 Chronic combined systolic (congestive) and diastolic (congestive) heart failure: Secondary | ICD-10-CM | POA: Diagnosis present

## 2021-10-25 DIAGNOSIS — I130 Hypertensive heart and chronic kidney disease with heart failure and stage 1 through stage 4 chronic kidney disease, or unspecified chronic kidney disease: Secondary | ICD-10-CM | POA: Diagnosis present

## 2021-10-25 DIAGNOSIS — E782 Mixed hyperlipidemia: Secondary | ICD-10-CM | POA: Diagnosis present

## 2021-10-25 DIAGNOSIS — F3010 Manic episode without psychotic symptoms, unspecified: Secondary | ICD-10-CM | POA: Diagnosis not present

## 2021-10-25 DIAGNOSIS — A419 Sepsis, unspecified organism: Secondary | ICD-10-CM

## 2021-10-25 DIAGNOSIS — E872 Acidosis, unspecified: Secondary | ICD-10-CM | POA: Diagnosis present

## 2021-10-25 DIAGNOSIS — I5022 Chronic systolic (congestive) heart failure: Secondary | ICD-10-CM | POA: Diagnosis present

## 2021-10-25 LAB — HEPATIC FUNCTION PANEL
ALT: 16 U/L (ref 0–44)
AST: 26 U/L (ref 15–41)
Albumin: 3.2 g/dL — ABNORMAL LOW (ref 3.5–5.0)
Alkaline Phosphatase: 48 U/L (ref 38–126)
Bilirubin, Direct: 0.2 mg/dL (ref 0.0–0.2)
Indirect Bilirubin: 0.3 mg/dL (ref 0.3–0.9)
Total Bilirubin: 0.5 mg/dL (ref 0.3–1.2)
Total Protein: 6.9 g/dL (ref 6.5–8.1)

## 2021-10-25 LAB — FIBRINOGEN: Fibrinogen: 411 mg/dL (ref 210–475)

## 2021-10-25 LAB — LACTIC ACID, PLASMA
Lactic Acid, Venous: 2.2 mmol/L (ref 0.5–1.9)
Lactic Acid, Venous: 2.9 mmol/L (ref 0.5–1.9)
Lactic Acid, Venous: 1.7 mmol/L (ref 0.5–1.9)
Lactic Acid, Venous: 3.1 mmol/L (ref 0.5–1.9)

## 2021-10-25 LAB — URINALYSIS, ROUTINE W REFLEX MICROSCOPIC
Bilirubin Urine: NEGATIVE
Glucose, UA: NEGATIVE mg/dL
Ketones, ur: NEGATIVE mg/dL
Nitrite: NEGATIVE
Protein, ur: 100 mg/dL — AB
Specific Gravity, Urine: 1.025 (ref 1.005–1.030)
pH: 5 (ref 5.0–8.0)

## 2021-10-25 LAB — RESP PANEL BY RT-PCR (FLU A&B, COVID) ARPGX2
Influenza A by PCR: NEGATIVE
Influenza B by PCR: NEGATIVE
SARS Coronavirus 2 by RT PCR: POSITIVE — AB

## 2021-10-25 LAB — TROPONIN I (HIGH SENSITIVITY)
Troponin I (High Sensitivity): 14 ng/L (ref <18)
Troponin I (High Sensitivity): 12 ng/L (ref <18)

## 2021-10-25 LAB — BASIC METABOLIC PANEL
Anion gap: 14 (ref 5–15)
BUN: 45 mg/dL — ABNORMAL HIGH (ref 8–23)
CO2: 22 mmol/L (ref 22–32)
Calcium: 8.4 mg/dL — ABNORMAL LOW (ref 8.9–10.3)
Chloride: 98 mmol/L (ref 98–111)
Creatinine, Ser: 2.78 mg/dL — ABNORMAL HIGH (ref 0.44–1.00)
GFR, Estimated: 18 mL/min — ABNORMAL LOW (ref >60)
Glucose, Bld: 283 mg/dL — ABNORMAL HIGH (ref 70–99)
Potassium: 4.5 mmol/L (ref 3.5–5.1)
Sodium: 134 mmol/L — ABNORMAL LOW (ref 135–145)

## 2021-10-25 LAB — URINALYSIS, MICROSCOPIC (REFLEX)

## 2021-10-25 LAB — GLUCOSE, CAPILLARY
Glucose-Capillary: 186 mg/dL — ABNORMAL HIGH (ref 70–99)
Glucose-Capillary: 208 mg/dL — ABNORMAL HIGH (ref 70–99)
Glucose-Capillary: 185 mg/dL — ABNORMAL HIGH (ref 70–99)

## 2021-10-25 LAB — CBC
HCT: 28.4 % — ABNORMAL LOW (ref 36.0–46.0)
Hemoglobin: 9.5 g/dL — ABNORMAL LOW (ref 12.0–15.0)
MCH: 29.1 pg (ref 26.0–34.0)
MCHC: 33.5 g/dL (ref 30.0–36.0)
MCV: 87.1 fL (ref 80.0–100.0)
Platelets: 230 10*3/uL (ref 150–400)
RBC: 3.26 MIL/uL — ABNORMAL LOW (ref 3.87–5.11)
RDW: 13.3 % (ref 11.5–15.5)
WBC: 6.3 10*3/uL (ref 4.0–10.5)
nRBC: 0 % (ref 0.0–0.2)

## 2021-10-25 LAB — LACTATE DEHYDROGENASE: LDH: 165 U/L (ref 98–192)

## 2021-10-25 LAB — PROCALCITONIN: Procalcitonin: 0.3 ng/mL

## 2021-10-25 LAB — C-REACTIVE PROTEIN: CRP: 1.4 mg/dL — ABNORMAL HIGH (ref <1.0)

## 2021-10-25 LAB — D-DIMER, QUANTITATIVE: D-Dimer, Quant: 0.7 ug/mL-FEU — ABNORMAL HIGH (ref 0.00–0.50)

## 2021-10-25 LAB — FERRITIN: Ferritin: 74 ng/mL (ref 11–307)

## 2021-10-25 MED ORDER — SODIUM CHLORIDE 0.9 % IV SOLN
1.0000 g | Freq: Once | INTRAVENOUS | Status: AC
  Administered 2021-10-25: 1 g via INTRAVENOUS
  Filled 2021-10-25: qty 10

## 2021-10-25 MED ORDER — OLANZAPINE 5 MG PO TBDP
5.0000 mg | ORAL_TABLET | Freq: Every day | ORAL | Status: DC
  Administered 2021-10-26 – 2021-10-28 (×3): 5 mg via ORAL
  Filled 2021-10-25 (×3): qty 1

## 2021-10-25 MED ORDER — INSULIN ASPART 100 UNIT/ML IJ SOLN
0.0000 [IU] | Freq: Three times a day (TID) | INTRAMUSCULAR | Status: DC
  Administered 2021-10-26: 1 [IU] via SUBCUTANEOUS

## 2021-10-25 MED ORDER — SODIUM CHLORIDE 0.9 % IV BOLUS: 1000.0000 mL | Freq: Once | INTRAVENOUS | Status: DC

## 2021-10-25 MED ORDER — NITROGLYCERIN 0.4 MG SL SUBL
0.4000 mg | SUBLINGUAL_TABLET | SUBLINGUAL | Status: DC | PRN
  Administered 2021-10-25: 0.4 mg via SUBLINGUAL

## 2021-10-25 MED ORDER — ROSUVASTATIN CALCIUM 20 MG PO TABS
20.0000 mg | ORAL_TABLET | Freq: Every day | ORAL | Status: DC
  Administered 2021-10-26 – 2021-10-28 (×3): 20 mg via ORAL
  Filled 2021-10-25 (×3): qty 1

## 2021-10-25 MED ORDER — ACETAMINOPHEN 650 MG RE SUPP: 650.0000 mg | Freq: Four times a day (QID) | RECTAL | Status: DC | PRN

## 2021-10-25 MED ORDER — SODIUM CHLORIDE 0.9 % IV BOLUS
500.0000 mL | Freq: Once | INTRAVENOUS | Status: AC
  Administered 2021-10-25: 500 mL via INTRAVENOUS

## 2021-10-25 MED ORDER — ACETAMINOPHEN 325 MG PO TABS: 650.0000 mg | ORAL_TABLET | Freq: Four times a day (QID) | ORAL | Status: DC | PRN

## 2021-10-25 MED ORDER — ENOXAPARIN SODIUM 30 MG/0.3ML IJ SOSY
30.0000 mg | PREFILLED_SYRINGE | INTRAMUSCULAR | Status: DC
  Administered 2021-10-25 – 2021-10-27 (×3): 30 mg via SUBCUTANEOUS
  Filled 2021-10-25 (×3): qty 0.3

## 2021-10-25 MED ORDER — ONDANSETRON HCL 4 MG PO TABS: 4.0000 mg | ORAL_TABLET | Freq: Four times a day (QID) | ORAL | Status: DC | PRN

## 2021-10-25 MED ORDER — ONDANSETRON HCL 4 MG/2ML IJ SOLN: 4.0000 mg | Freq: Four times a day (QID) | INTRAMUSCULAR | Status: DC | PRN

## 2021-10-25 MED ORDER — SODIUM CHLORIDE 0.9 % IV SOLN
1.0000 g | INTRAVENOUS | Status: DC
  Administered 2021-10-26 – 2021-10-27 (×2): 1 g via INTRAVENOUS
  Filled 2021-10-25 (×2): qty 10

## 2021-10-25 MED ORDER — CYCLOSPORINE 0.05 % OP EMUL
1.0000 [drp] | Freq: Two times a day (BID) | OPHTHALMIC | Status: DC
  Administered 2021-10-26 – 2021-10-28 (×5): 1 [drp] via OPHTHALMIC
  Filled 2021-10-25 (×6): qty 30

## 2021-10-25 MED ORDER — HYDROXYZINE HCL 10 MG PO TABS
10.0000 mg | ORAL_TABLET | Freq: Three times a day (TID) | ORAL | Status: DC | PRN
  Filled 2021-10-25: qty 1

## 2021-10-25 MED ORDER — LIDOCAINE VISCOUS HCL 2 % MT SOLN
15.0000 mL | Freq: Once | OROMUCOSAL | Status: AC
  Administered 2021-10-25: 15 mL via ORAL
  Filled 2021-10-25: qty 15

## 2021-10-25 MED ORDER — FAMOTIDINE 20 MG PO TABS
40.0000 mg | ORAL_TABLET | Freq: Every day | ORAL | Status: DC
  Administered 2021-10-26: 40 mg via ORAL
  Filled 2021-10-25: qty 2

## 2021-10-25 MED ORDER — SODIUM CHLORIDE 0.9 % IV SOLN: INTRAVENOUS | Status: AC

## 2021-10-25 MED ORDER — PANTOPRAZOLE SODIUM 40 MG PO TBEC
80.0000 mg | DELAYED_RELEASE_TABLET | Freq: Every day | ORAL | Status: DC
  Administered 2021-10-26 – 2021-10-28 (×3): 80 mg via ORAL
  Filled 2021-10-25 (×3): qty 2

## 2021-10-25 MED ORDER — NITROGLYCERIN 0.4 MG SL SUBL
SUBLINGUAL_TABLET | SUBLINGUAL | Status: AC
  Filled 2021-10-25: qty 1

## 2021-10-25 MED ORDER — OXYCODONE HCL 5 MG PO TABS: 5.0000 mg | ORAL_TABLET | Freq: Once | ORAL | Status: DC | PRN

## 2021-10-25 MED ORDER — CYCLOSPORINE (PF) 0.09 % OP SOLN: 1.0000 [drp] | Freq: Every evening | OPHTHALMIC | Status: DC

## 2021-10-25 MED ORDER — ALBUTEROL SULFATE HFA 108 (90 BASE) MCG/ACT IN AERS
2.0000 | INHALATION_SPRAY | RESPIRATORY_TRACT | Status: DC | PRN
  Filled 2021-10-25: qty 6.7

## 2021-10-25 MED ORDER — EZETIMIBE 10 MG PO TABS
10.0000 mg | ORAL_TABLET | Freq: Every day | ORAL | Status: DC
  Administered 2021-10-26 – 2021-10-28 (×3): 10 mg via ORAL
  Filled 2021-10-25 (×3): qty 1

## 2021-10-25 MED ORDER — SODIUM CHLORIDE 0.9 % IV SOLN: INTRAVENOUS | Status: DC

## 2021-10-25 MED ORDER — ALUM & MAG HYDROXIDE-SIMETH 200-200-20 MG/5ML PO SUSP
30.0000 mL | Freq: Once | ORAL | Status: AC
  Administered 2021-10-25: 30 mL via ORAL
  Filled 2021-10-25: qty 30

## 2021-10-25 NOTE — Progress Notes (Signed)
Patient developed recurrent chest pain tonight, described as different than earlier, radiating to left arm, "7/10" in intensity, and pressure in character.  ? ?She appears well, no diaphoresis or pallor.  ? ?Cardiology note from January was reviewed.  ? ?No acute EKG changes. Essentially unchanged after SL NTG. Suspicion for ACS is low. Will check troponins.  ?

## 2021-10-25 NOTE — ED Notes (Signed)
ED Provider at bedside. 

## 2021-10-25 NOTE — Assessment & Plan Note (Signed)
Appears euvolemic to dry on exam ?Echo in 05/2021: EF 55-60 with normal LVF. Grade I DD ?Strict I/O ?Gentle, time limited IVF ?Continue medical management with lisinopril, coreg and lasix after resolution of hypotension and AKI ?

## 2021-10-25 NOTE — ED Notes (Signed)
Pt denies any suicidal plans or intent at this time ?

## 2021-10-25 NOTE — ED Triage Notes (Addendum)
Pt brought in by EMS from home. Dx with covid yesterday at an urgent care. Nasal and chest congestion started yesterday  Weak and unsteady today with ambulation. No pain or cough per pt ? ?

## 2021-10-25 NOTE — Progress Notes (Signed)
NEW ADMISSION NOTE ?New Admission Note:  ? ?Arrival Method: stretcher ?Mental Orientation: A&OX4 ?Telemetry:5M04 ?Assessment: Completed ?Skin: intact ?IV:RFA ?Pain: 0/10 ?Tubes:none ?Safety Measures: Safety Fall Prevention Plan has been given, discussed and signed ?Admission: Completed ?[facility] Orientation: Patient has been orientated to the room, unit and staff.  ?Family:none at bedside  ? ?Orders have been reviewed and implemented. Will continue to monitor the patient. Call light has been placed within reach and bed alarm has been activated.  ? ?Therin Vetsch S Lashone Stauber, RN   ?

## 2021-10-25 NOTE — Assessment & Plan Note (Addendum)
71 year old female presenting ot ED with complaints of dizziness and weakness found to have UTi and covid. She has no leukocytosis and no CVA tenderness on exam  ?-admit to telemetry ?-received rocephin, will continue ?-urine cultures pending ?-BC pending  ?-concern for UTI on 2/21 at time of hospitalization, but left AMA ?-with worsening creatinine and UTI, check renal US ? ?

## 2021-10-25 NOTE — Assessment & Plan Note (Signed)
Very well controlled. Recent a1c of 5.7 in 09/2021 ?Very sensitive SSI and accuchecks per protocol  ? ?

## 2021-10-25 NOTE — ED Notes (Signed)
[facility] at bedside. Attempted to call report to RN on 85M. Left message with contact # to give report when available ?

## 2021-10-25 NOTE — Plan of Care (Signed)
Transfer from [facility] ? ?Teresa Poole is a 71 year old female with pmh of CKD IV, NICM last EF was in 2022 was 55-60% with grade 1 diastolic dysf, HTN, DM, chronic anemia, memory issues presents with complaints of dizziness and weakness.  Reported nasal congestion and was just diagnosed with COVID-19 yesterday.  Labs significant for hemoglobin 9.5, BUN 45, creatinine 2.7(baseline previously 1.8-2.1), lactic acid 2.9.  Urinalysis noted many bacteria.  Patient was given 1 L of IV fluids and started on Rocephin. ? ?She had been hospitalized 1/30-2/4 after being found to have SBO with transition point in the jejunum/ileum improved with conservative measures.  Subsequently patient has been seen in the hospital for signs of dizziness on 2/21, but left AMA.  Urine at that time was concerning for possible UTI for which she was started on Rocephin.  Repeat urinalysis seen subsequently in the emergency department on 2/24 with suicidal ideations, but ultimately appears patient was discharged home. ? ?

## 2021-10-25 NOTE — ED Provider Notes (Signed)
[facility] EMERGENCY DEPARTMENT Provider Note   CSN: [id number] Arrival date & time: 10/25/21  9741     History  Chief Complaint  Patient presents with   Covid Positive    Teresa Poole is a 71 y.o. female.  Past medical history of diabetes type 2, CHF, right bundle branch block, renal artery stenosis, hypertension, obesity, mild CAD, hyperlipidemia, thyroid disease, anemia of chronic disease, CKD stage IV. Patient presents the emergency department with a chief complaint of chest congestion.  She is a poor historian because she has had having recent memory issues as being worked up by another provider.  She states that yesterday afternoon she started feeling like her "chest was congested".  She describes this as a pressure in the center of her chest. She doesn't remember if she had any other associated symptoms at that time. She ended up going to urgent care yesterday where they did a rapid COVID swab and she was positive for COVID-19.  Apparently around midnight last night, she started to feel worsening chest pressure in the center of her chest.  It did not radiate.  The pain has been constant since then has not gotten any better or worse. Not associated with exertion.  It is present at rest.  She states that this morning when she got up from bed she was walking to go get breakfast and she started to feel really dizzy and lightheaded.  She says her vision started going dark and she felt like she is going to pass out.  She does not remember or think that this was associated with her from going from a sitting to standing position.  She also felt like she was unsteady and felt clumsy walking.  As she is sitting in the bed, patient denies any current symptoms of feeling like she is going to pass out.  She does endorse some lightheadedness though.  She recently notes that she had a urinary tract infection and AKI that required hospitalization. She denies any other URI symptoms such as  cough, fever, congestion.  She denies any abdominal pain, nausea, vomiting.  HPI     Home Medications Prior to Admission medications   Medication Sig Start Date End Date Taking? Authorizing Provider  carvedilol (COREG) 25 MG tablet Take 25 mg by mouth 2 (two) times daily with a meal.    [provider]  cycloSPORINE, PF, (CEQUA) 0.09 % SOLN Place 1 drop into both eyes in the morning and at bedtime. 12/18/20   [provider]  ezetimibe (ZETIA) 10 MG tablet Take 10 mg by mouth daily. 06/09/21   [provider]  famotidine (PEPCID) 40 MG tablet Take 40 mg by mouth daily. 09/04/21   [provider]  furosemide (LASIX) 20 MG tablet Take 20 mg by mouth daily. 09/17/21   [provider]  glucose blood (ONE TOUCH ULTRA TEST) test strip Use to check blood sugar 3 times daily Dx code E11.65 Patient taking differently: 1 each by Other route See admin instructions. 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 09/17/21   [provider]  indapamide (LOZOL) 2.5 MG tablet Take 2.5 mg by mouth daily. 09/22/21   [provider]  lisinopril (PRINIVIL,ZESTRIL) 40 MG tablet Take 1 tablet (40 mg total) by mouth daily. 10/10/18   Copland, Gay Filler,  MD  NIFEdipine (ADALAT CC) 60 MG 24 hr tablet Take 1 tablet (60 mg total) by mouth daily. 07/09/21   Daleen Bo, MD  OLANZapine zydis (ZYPREXA) 5 MG disintegrating tablet Take 1 tablet (5 mg total) by mouth daily. 09/28/21   Eugenie Filler, MD  omeprazole (PRILOSEC) 40 MG capsule Take 40 mg by mouth daily. 06/08/21   [provider]  ONETOUCH VERIO test strip [place] Patient taking differently: 1 each by Other route in the morning, at noon, and at bedtime. 03/30/18   Elayne Snare, MD  OZEMPIC, 0.25 OR 0.5 MG/DOSE, 2 MG/1.5ML SOPN Inject  0.25 mg into the skin once a week. Sundays 05/12/21   [provider]  polyethylene glycol (MIRALAX / GLYCOLAX) 17 g packet Take 17 g by mouth daily. 09/28/21   Eugenie Filler, MD  sucralfate (CARAFATE) 1 GM/10ML suspension Take 0.5 g by mouth in the morning, at noon, in the evening, and at bedtime. 09/14/21   [provider]      Allergies    Hydralazine hcl and Atorvastatin    Review of Systems   Review of Systems  Constitutional:  Negative for chills and fever.  HENT:  Negative for congestion, rhinorrhea and sore throat.   Eyes:  Negative for visual disturbance.  Respiratory:  Positive for chest tightness and shortness of breath. Negative for cough.   Cardiovascular:  Positive for chest pain. Negative for palpitations and leg swelling.  Gastrointestinal:  Negative for abdominal pain, nausea and vomiting.  Musculoskeletal:  Positive for gait problem.  Neurological:  Positive for dizziness, weakness and light-headedness. Negative for headaches.       Pre syncope  All other systems reviewed and are negative.  Physical Exam Updated Vital Signs BP (!) 103/59    Pulse (!) 59    Temp 97.6 F (36.4 C) (Oral)    Resp 15    Ht '5\' 7"'$  (1.702 m)    Wt 70.3 kg    SpO2 100%    BMI 24.28 kg/m  Physical Exam Vitals and nursing note reviewed.  Constitutional:      General: She is not in acute distress.    Appearance: Normal appearance. She is not ill-appearing, toxic-appearing or diaphoretic.  HENT:     Head: Normocephalic and atraumatic.     Right Ear: Tympanic membrane, ear canal and external ear normal. There is no impacted cerumen.     Left Ear: Tympanic membrane, ear canal and external ear normal. There is no impacted cerumen.     Nose: Nose normal. No nasal deformity, congestion or rhinorrhea.     Mouth/Throat:     Lips: Pink. No lesions.     Mouth: Mucous membranes are dry. No injury, lacerations, oral lesions or angioedema.     Pharynx: Oropharynx is clear. Uvula  midline. No pharyngeal swelling, oropharyngeal exudate, posterior oropharyngeal erythema or uvula swelling.  Eyes:     General: Gaze aligned appropriately. No scleral icterus.       Right eye: No discharge.        Left eye: No discharge.     Extraocular Movements: Extraocular movements intact.     Conjunctiva/sclera: Conjunctivae normal.     Right eye: Right conjunctiva is not injected. No exudate or hemorrhage.    Left eye: Left conjunctiva is not injected. No exudate or hemorrhage.    Pupils: Pupils are equal, round, and reactive to light.  Cardiovascular:     Rate and Rhythm:  Normal rate and regular rhythm.     Pulses: Normal pulses.          Radial pulses are 2+ on the right side and 2+ on the left side.       Dorsalis pedis pulses are 2+ on the right side and 2+ on the left side.     Heart sounds: Normal heart sounds, S1 normal and S2 normal. Heart sounds not distant. No murmur heard.   No friction rub. No gallop. No S3 or S4 sounds.  Pulmonary:     Effort: Pulmonary effort is normal. No accessory muscle usage or respiratory distress.     Breath sounds: Normal breath sounds. No stridor. No wheezing, rhonchi or rales.     Comments: Lungs clear Chest:     Chest wall: No tenderness.  Abdominal:     General: Abdomen is flat. Bowel sounds are normal. There is no distension.     Palpations: Abdomen is soft. There is no mass or pulsatile mass.     Tenderness: There is no abdominal tenderness. There is no guarding or rebound.  Musculoskeletal:     Cervical back: Neck supple.     Right lower leg: No edema.     Left lower leg: No edema.  Skin:    General: Skin is warm and dry.     Coloration: Skin is not jaundiced or pale.     Findings: No bruising, erythema, lesion or rash.  Neurological:     General: No focal deficit present.     Mental Status: She is alert and oriented to person, place, and time.     GCS: GCS eye subscore is 4. GCS verbal subscore is 5. GCS motor subscore is 6.      Comments: Alert and Oriented x 3 Speech clear with no aphasia Cranial Nerve testing - PERRLA. EOM intact. No Nystagmus - Facial Sensation grossly intact - No facial asymmetry - Uvula and Tongue Midline - Accessory Muscles intact Motor: - 5/5 motor strength in all four extremities.  - No pronator Drift - Normal tone Sensation: - Grossly intact in all four extremities.  Coordination:  - Finger to nose and heel to shin intact bilaterally   Psychiatric:        Mood and Affect: Mood normal.        Behavior: Behavior normal. Behavior is cooperative.    ED Results / Procedures / Treatments   Labs (all labs ordered are listed, but only abnormal results are displayed) Labs Reviewed  RESP PANEL BY RT-PCR (FLU A&B, COVID) ARPGX2 - Abnormal; Notable for the following components:      Result Value   SARS Coronavirus 2 by RT PCR POSITIVE (*)    All other components within normal limits  BASIC METABOLIC PANEL - Abnormal; Notable for the following components:   Sodium 134 (*)    Glucose, Bld 283 (*)    BUN 45 (*)    Creatinine, Ser 2.78 (*)    Calcium 8.4 (*)    GFR, Estimated 18 (*)    All other components within normal limits  CBC - Abnormal; Notable for the following components:   RBC 3.26 (*)    Hemoglobin 9.5 (*)    HCT 28.4 (*)    All other components within normal limits  HEPATIC FUNCTION PANEL - Abnormal; Notable for the following components:   Albumin 3.2 (*)    All other components within normal limits  URINALYSIS, ROUTINE W REFLEX MICROSCOPIC - Abnormal; Notable for  the following components:   Hgb urine dipstick TRACE (*)    Protein, ur 100 (*)    Leukocytes,Ua SMALL (*)    All other components within normal limits  LACTIC ACID, PLASMA - Abnormal; Notable for the following components:   Lactic Acid, Venous 2.2 (*)    All other components within normal limits  LACTIC ACID, PLASMA - Abnormal; Notable for the following components:   Lactic Acid, Venous 2.9 (*)    All  other components within normal limits  URINALYSIS, MICROSCOPIC (REFLEX) - Abnormal; Notable for the following components:   Bacteria, UA MANY (*)    All other components within normal limits  CULTURE, BLOOD (ROUTINE X 2)  CULTURE, BLOOD (ROUTINE X 2)  URINE CULTURE  TROPONIN I (HIGH SENSITIVITY)  TROPONIN I (HIGH SENSITIVITY)    EKG None  Radiology DG Chest 2 View  Result Date: 10/25/2021 CLINICAL DATA:  71 year old female with history of pneumonia. Diagnosed with COVID yesterday. EXAM: CHEST - 2 VIEW COMPARISON:  Chest x-ray 09/09/2021. FINDINGS: Lung volumes are normal. No consolidative airspace disease. No pleural effusions. No pneumothorax. No pulmonary nodule or mass noted. Pulmonary vasculature and the cardiomediastinal silhouette are within normal limits. Atherosclerotic calcifications are noted in the thoracic aorta. IMPRESSION: 1.  No radiographic evidence of acute cardiopulmonary disease. 2. Aortic atherosclerosis. Electronically Signed   By: Vinnie Langton M.D.   On: 10/25/2021 10:17    Procedures Procedures  This patient was on telemetry or cardiac monitoring during their time in the ED.    Medications Ordered in ED Medications  0.9 %  sodium chloride infusion ( Intravenous New Bag/Given 10/25/21 1520)  sodium chloride 0.9 % bolus 500 mL (0 mLs Intravenous Stopped 10/25/21 1101)  sodium chloride 0.9 % bolus 500 mL (0 mLs Intravenous Stopped 10/25/21 1516)  cefTRIAXone (ROCEPHIN) 1 g in sodium chloride 0.9 % 100 mL IVPB (0 g Intravenous Stopped 10/25/21 1437)  alum & mag hydroxide-simeth (MAALOX/MYLANTA) 200-200-20 MG/5ML suspension 30 mL (30 mLs Oral Given 10/25/21 1437)    And  lidocaine (XYLOCAINE) 2 % viscous mouth solution 15 mL (15 mLs Oral Given 10/25/21 1437)    ED Course/ Medical Decision Making/ A&P Clinical Course as of 10/25/21 1639  Sat Oct 25, 2021  1424 Dr. Tamala Julian to accept patient [GL]  1445 Patient with Lactic acid, hypotension.  Urine tested positive for  multiple bacteria.  We will proceed with blood cultures and urine culture.  Suspect this is urosepsis.  She also has subsequent COVID-19, however she does not really have any symptoms to suggest pneumonia at this time. [GL]    Clinical Course User Index [GL] Sherre Poot Adora Fridge, PA-C                           Medical Decision Making Amount and/or Complexity of Data Reviewed Labs: ordered. Radiology: ordered.  Risk OTC drugs. Prescription drug management. Decision regarding hospitalization.    MDM  This is a 71 y.o. female with a pertinent PMH of diabetes type 2, CHF, right bundle branch block, renal artery stenosis, hypertension, obesity, mild CAD, hyperlipidemia, thyroid disease, anemia of chronic disease, CKD stage IV. who presents to the ED with multiple vague complaints. She is a poor historian and was difficult to ascertain exactly what her symptoms were in the timeline of them.  It sounds like she started feeling off yesterday with some chest congestion feelings when she went to urgent care.  She was diagnosed  with COVID-19.  She has since developed worsening chest pressure in the center of her chest, and feelings of lightheadedness and dizziness today while ambulating.  She has sensations of presyncope.  Patient is mildly hypotensive here with a blood pressure of 94/56.  Patient states she did take her blood pressure medication today.  On review of past records, patient is normally fairly hypertensive.  Her heart rate is normal.  She is afebrile.  She is saturating 99% on room air.  Appears fairly well.  She does not look hypervolemic on exam despite a history of CHF.  She does not describe any typical URI symptoms and has no abnormal lung sounds to suggest pneumonia or a viral process causing her symptoms.  She has good pulses and good color to her.  Perfusing well.  Despite is having subjective gait disbalance, patient's neurological exam is completely normal. I don't think that the  "dizzy" symptoms are true vertigo and do not think she requires neuroimaging at this time.  I suspect that she is feeling more generalized weakness than actual gait ataxia. I want to work her up for cardiac causes to her symptoms as well as screening for pneumonia.  She is hypotensive so sepsis is a possibility, but I do not have an obvious source and she is afebrile., will obtain lactate to screen, but I do not have this high on my differential. Review of past medical records, patient was recently admitted for an Acute on Chronic AKI that she left AMA for.  Want to check her kidney function to reassess for this as well.  We will reassess her urinalysis because at that time she also had a UTI.  Plan to give 500 cc of fluid bolus given her history of heart failure and will reassess following this. Most recent Echo done in 05/2021 revealed just mild diastolic dysfunction, so may be able to give more fluids later.  The differential of this patient includes but is not limited to ACS, PNA, COVID-19, AKI, UTI, Sepsis, electrolyte abnormality, etc.  I personally ordered, reviewed, and interpreted all laboratory work and imaging and agree with radiologist interpretation. Results interpreted below:  Mild hyponatremia to 134.  Glucose 283.  No anion gap or acidosis noted.  Creatinine is 2.78 which is stable from baseline. CBC shows stable anemia with hemoglobin of 9.5.  Hepatic function panel unremarkable.  Troponin x2 was negative.  Lactic acid is elevated to 2.2 with second elevated to 2.9.  UA is notable for many bacteria present. COVID +. CXR unremarkable. EKG with RBBB with is stable.  With lactic acidosis, attention, and positive urine screening, I think this could be possible urosepsis.  Will start on Rocephin and gave a total of 1 L IV fluids given history of CHF.  She also is positive for COVID-19.  Chest x-ray did not not show any pneumonia and patient's presentation does not really suggest pneumonia  process.  I think symptoms are more correlated to systemic complicated urinary tract infection with subsequent COVID-19 infection. On reassessment, patient BP responded to IVF, but is still on the lower side of normal. She continues to have this vague chest discomfort. Chest workup is not concerning for ACS. Plan to treat with GI cocktail. Patient will require admission and further treatment with IV antibiotics and fluids.  '@1424'$ , Dr. Tamala Julian consulted and will be accepting physician.     Charting Requirements Additional history is obtained from:  Independent historian External Records from outside source obtained and reviewed including:  Recent hospitalization for UTI and AKI Social Determinants of Health:  Poor health literacy and memory issues Pertinant PMH that complicates patient's illness: diabetes type 2, CHF, right bundle branch block, renal artery stenosis, hypertension, obesity, mild CAD, hyperlipidemia, thyroid disease, anemia of chronic disease, CKD stage IV  Patient Care Problems that were addressed during this visit: - Urinary tract infection: Acute illness with systemic symptoms - Sepsis: Acute illness with systemic symptoms - COVID-19: Acute illness with systemic symptoms This patient was maintained on a cardiac monitor/telemetry. I personally viewed and interpreted the cardiac monitor which reveals an underlying rhythm of RBBB, NRS Medications given in ED: GI cocktail, IVF, Rocephin Reevaluation of the patient after these medicines showed that the patient improved Critical Care Interventions: IVF and antibiotics for sepsis Disposition: admit  I have discussed this patient with my attending physician, Dr. Billy Fischer who has made changes to the plan accordingly.  Portions of this note were generated with [employer]. Dictation errors may occur despite best attempts at proofreading.    Final Clinical Impression(s) / ED Diagnoses Final diagnoses:  Acute  cystitis without hematuria  Sepsis without acute organ dysfunction, due to unspecified organism [facility])  COVID-19    Rx / DC Orders ED Discharge Orders     None         Adolphus Birchwood, PA-C 10/25/21 1639    Gareth Morgan, MD 10/25/21 2307

## 2021-10-25 NOTE — Assessment & Plan Note (Signed)
Baseline 9-10, stable ?Continue to monitor  ?

## 2021-10-25 NOTE — Assessment & Plan Note (Addendum)
Soft blood pressures today, likely secondary to UTI/?covid ?Were low when saw nephrologist (203 systolic) and recommended decreasing lisinopril down to '10mg'$   ?Received 1L bolus in ED and has been on IVF ?Holding home bp meds at this time: nifedipine, lisinopril, lozol, coreg (MAR not verified, but holding in setting of hypotension) ?

## 2021-10-25 NOTE — Assessment & Plan Note (Addendum)
Asymptomatic. Tested positive on thursday after going to a facility for dizziness and started on paxlovid. Has had 2 days of medication. With GFR <30, not recommended.  ?Contact/ airborne precautions  ?Only has weakness as symptom, no respiratory symptoms. Likely dizzy from hypotension ?Oxygen >94% on RA and CXR clear ?Not a candidate for paxlovid due to GFR <30 ?Cycle threshold: 16.1 ?Check covid labs ?continue conservative therapy at this time as she is asymptomatic. Could re initiate paxlovid if GFR improves  ?

## 2021-10-25 NOTE — H&P (Addendum)
History and Physical    Patient: Teresa Poole ZTI:[id number] DOB: 08-13-1951 DOA: 10/25/2021 DOS: the patient was seen and examined on 10/25/2021 PCP: [name], MD  Patient coming from:  [facility]  - lives alone.    Chief Complaint: dizziness and weakness   HPI: Teresa Poole is a 71 y.o. female with medical history significant of CKD IV, NICM with EF of 55-60% and grade 1 diastolic dysfunction, HTN, T2DM, anemia, memory issues who presented to ED with complaints of dizziness and weakness.  She states today when she tried to stand up she got dizzy and felt like she was going to pass out. She also has lower back pain that feels like "muscle fatigue" like she ran 10 miles and it radiates down into her legs. She only has this when she stands up. She also has a  tingling in her feet. This all started today.    Recently admitted on 10/14/21 for acute on chronic renal disease with creatinine of 3.7 at time of admit and concerns for UTI; however, she left AMA early on 10/15/21.   She denies any fever/chills, very slight h/a, no chest pain or palpitations but she does have chest pressure, no shortness of breath or coughing, no stomach pain, no N/V/D, she does have some dysuria and some burning without urination, no urgency or frequency. She does have some incontinence that is new, no leg swelling. Denies any AH/VH/SI/HI.   She tested positive for covid on Thursday and was started on paxlovid Thursday and Friday, didn't take today. Has had covid vaccine x 2 with pfizer and one booster   ER Course:  vitals: afebrile, bp: 94/56, HR: 60, RR: 18, oxygen 99%RA Pertinent labs: hgb: 9.5 (9-10), BUN: 45, creatinine: 2.78 (2.1), lactic acid: 2.2>2.9, COVID: positive,  CXR: no acute finding   Review of Systems: As mentioned in the history of present illness. All other systems reviewed and are negative. Past Medical History:  Diagnosis Date   Allergy    Anemia    Carotid stenosis    Chronic combined  systolic and diastolic CHF (congestive heart failure) (HCC)    CKD (chronic kidney disease), stage IV ([place]) 09/24/2013   Pt at [place], Dr. Joelyn Oms   COVID-19 10/24/2021   Diabetes mellitus type 2 in nonobese [facility])    Edema 09/14/2013   GERD (gastroesophageal reflux disease)    History of ovarian cancer    Hyperlipidemia    Hypertension    Mild CAD    non-obstructive by LHC (09/25/2013): Proximal and mid LAD serial 20%, proximal circumflex 30%, mid AV groove circumflex 30%, mid RCA mild plaque.   NICM (nonischemic cardiomyopathy) (HCC)    Obesity (BMI 30-39.9)    RBBB    Renal artery stenosis ([place]) 10/11/2018   Thyroid disease    Seen by specialist   Past Surgical History:  Procedure Laterality Date   LEFT HEART CATHETERIZATION WITH CORONARY ANGIOGRAM N/A 09/25/2013   Procedure: LEFT HEART CATHETERIZATION WITH CORONARY ANGIOGRAM;  Surgeon: Burnell Blanks, MD;  Location[facility] CATH LAB;  Service: Cardiovascular;  Laterality: N/A;   RENAL ANGIOGRAPHY N/A 10/06/2018   Procedure: RENAL ANGIOGRAPHY;  Surgeon: Marty Heck, MD;  Location: [place] CV LAB;  Service: Cardiovascular;  Laterality: N/A;   Social History:  reports that she has never smoked. She has never used smokeless tobacco. She reports that she does not drink alcohol and does not use drugs.  Allergies  Allergen Reactions   Hydralazine Hcl  Itching    ENTIRE BODY = burning sensation, also in the eyes (they have become very sensitive to light)   Atorvastatin Other (See Comments)    Per MD - pt not sure of reaction     Family History  Problem Relation Age of Onset   Diabetes Father    Hypertension Father    Hypertension Maternal Grandfather     Prior to Admission medications   Medication Sig Start Date End Date Taking? Authorizing Provider  carvedilol (COREG) 25 MG tablet Take 25 mg by mouth 2 (two) times daily with a meal.    [provider]  cycloSPORINE, PF, (CEQUA) 0.09 % SOLN Place 1  drop into both eyes in the morning and at bedtime. 12/18/20   [provider]  ezetimibe (ZETIA) 10 MG tablet Take 10 mg by mouth daily. 06/09/21   [provider]  famotidine (PEPCID) 40 MG tablet Take 40 mg by mouth daily. 09/04/21   [provider]  furosemide (LASIX) 20 MG tablet Take 20 mg by mouth daily. 09/17/21   [provider]  glucose blood (ONE TOUCH ULTRA TEST) test strip Use to check blood sugar 3 times daily Dx code E11.65 Patient taking differently: 1 each by Other route See admin instructions. 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 09/17/21   [provider]  indapamide (LOZOL) 2.5 MG tablet Take 2.5 mg by mouth daily. 09/22/21   [provider]  lisinopril (PRINIVIL,ZESTRIL) 40 MG tablet Take 1 tablet (40 mg total) by mouth daily. 10/10/18   Copland, Gay Filler, MD  NIFEdipine (ADALAT CC) 60 MG 24 hr tablet Take 1 tablet (60 mg total) by mouth daily. 07/09/21   Daleen Bo, MD  OLANZapine zydis (ZYPREXA) 5 MG disintegrating tablet Take 1 tablet (5 mg total) by mouth daily. 09/28/21   Eugenie Filler, MD  omeprazole (PRILOSEC) 40 MG capsule Take 40 mg by mouth daily. 06/08/21   [provider]  ONETOUCH VERIO test strip [place] Patient taking differently: 1 each by Other route in the morning, at noon, and at bedtime. 03/30/18   Elayne Snare, MD  OZEMPIC, 0.25 OR 0.5 MG/DOSE, 2 MG/1.5ML SOPN Inject 0.25 mg into the skin once a week. Sundays 05/12/21   [provider]  polyethylene glycol (MIRALAX / GLYCOLAX) 17 g packet Take 17 g by mouth daily. 09/28/21   Eugenie Filler, MD  sucralfate (CARAFATE) 1 GM/10ML suspension Take 0.5 g by mouth in the morning, at noon, in the evening, and at bedtime. 09/14/21   [provider]    Physical  Exam: Vitals:   10/25/21 1600 10/25/21 1629 10/25/21 1630 10/25/21 1725  BP: (!) 105/58  (!) 103/59 (!) 105/54  Pulse: 62  (!) 59 64  Resp: '13  15 18  '$ Temp:  97.6 F (36.4 C)  97.8 F (36.6 C)  TempSrc:  Oral  Oral  SpO2: 98% 100% 100% 100%  Weight:    73.8 kg  Height:    '5\' 7"'$  (1.702 m)   General:  Appears calm and comfortable and is in NAD Eyes:  PERRL, EOMI, normal lids, iris ENT:  grossly normal hearing, lips & tongue, mmm; appropriate dentition Neck:  no LAD, masses or thyromegaly; no carotid bruits Cardiovascular:  RRR, no m/r/g. No LE  edema.  Respiratory:   CTA bilaterally with no wheezes/rales/rhonchi.  Normal respiratory effort. Abdomen:  soft, NT, ND, NABS Back:   normal alignment, no CVAT Skin:  no rash or induration seen on limited exam. Mild skin tenting  Musculoskeletal:  grossly normal tone BUE/BLE, good ROM, no bony abnormality Lower extremity:  No LE edema.  Limited foot exam with no ulcerations.  2+ distal pulses. Psychiatric:  grossly normal mood and affect, speech fluent and appropriate, Aox3. Denies any AH/VH/SI/HI Neurologic:  CN 2-12 grossly intact, moves all extremities in coordinated fashion, sensation intact   Radiological Exams on Admission: Independently reviewed - see discussion in A/P where applicable  DG Chest 2 View  Result Date: 10/25/2021 CLINICAL DATA:  72 year old female with history of pneumonia. Diagnosed with COVID yesterday. EXAM: CHEST - 2 VIEW COMPARISON:  Chest x-ray 09/09/2021. FINDINGS: Lung volumes are normal. No consolidative airspace disease. No pleural effusions. No pneumothorax. No pulmonary nodule or mass noted. Pulmonary vasculature and the cardiomediastinal silhouette are within normal limits. Atherosclerotic calcifications are noted in the thoracic aorta. IMPRESSION: 1.  No radiographic evidence of acute cardiopulmonary disease. 2. Aortic atherosclerosis. Electronically Signed   By: Vinnie Langton M.D.   On: 10/25/2021 10:17     EKG: Independently reviewed.  NSR with rate 59, RBBB; nonspecific ST changes with no evidence of acute ischemia. Similar to previous ekg    Labs on Admission: I have personally reviewed the available labs and imaging studies at the time of the admission.  Pertinent labs:   Covid positive (tested + on Thursday) hgb: 9.5 (9-10),  BUN: 45,  creatinine: 2.78 (2.2-2.5)  lactic acid: 2.2>2.9>1.7  Assessment and Plan: * UTI (urinary tract infection) 71 year old female presenting ot ED with complaints of dizziness and weakness found to have UTi and covid. She has no leukocytosis and no CVA tenderness on exam  -admit to telemetry -received rocephin, will continue -urine cultures pending -BC pending  -concern for UTI on 2/21 at time of hospitalization, but left AMA -with worsening creatinine and UTI, check renal US   COVID-19 virus infection Asymptomatic. Tested positive on thursday after going to a facility for dizziness and started on paxlovid. Has had 2 days of medication. With GFR <30, not recommended.  Contact/ airborne precautions  Only has weakness as symptom, no respiratory symptoms. Likely dizzy from hypotension Oxygen >94% on RA and CXR clear Not a candidate for paxlovid due to GFR <30 Cycle threshold: 16.1 Check covid labs continue conservative therapy at this time as she is asymptomatic. Could re initiate paxlovid if GFR improves   Acute renal failure superimposed on stage 4 chronic kidney disease ([place]) -recently admitted for acute on chronic renal failure with creatinine up to 3.7 on 2/21, creatinine of 2.7 today, baseline 2.2-2.5 after further reviewing nephrology notes   -does not use NSAIDs -UA suspicious for UTI and is hypotensive  -checking renal US  -continue gentle, time limited IVF -holding nephrotoxic drugs (lasix/lisinopril) -strict I/O -trend   Uncontrolled hypertension Soft blood pressures today, likely secondary to UTI/?covid Were low when saw  nephrologist (338 systolic) and recommended decreasing lisinopril down to '10mg'$   Received 1L bolus in ED and has been on IVF Holding home bp meds at this time: nifedipine, lisinopril, lozol, coreg (MAR not verified, but holding in setting of hypotension)  Chronic combined systolic and diastolic heart failure ([place]) Appears euvolemic to dry on exam Echo in 05/2021: EF 55-60 with normal LVF. Grade I DD Strict I/O Gentle, time  limited IVF Continue medical management with lisinopril, coreg and lasix after resolution of hypotension and AKI  Type 2 diabetes mellitus with chronic kidney disease, without long-term current use of insulin ([place]) Very well controlled. Recent a1c of 5.7 in 09/2021 Very sensitive SSI and accuchecks per protocol    Mixed hyperlipidemia Continue zetia   Anemia Baseline 9-10, stable Continue to monitor     Advance Care Planning:   Code Status: Full Code   Consults: psychiatry secondary to increased screening and PT  DVT Prophylaxis: lovenox   Family Communication: talked to her sister Euva Rundell on phone: [phone]  Severity of Illness: The appropriate patient status for this patient is INPATIENT. Inpatient status is judged to be reasonable and necessary in order to provide the required intensity of service to ensure the patient's safety. The patient's presenting symptoms, physical exam findings, and initial radiographic and laboratory data in the context of their chronic comorbidities is felt to place them at high risk for further clinical deterioration. Furthermore, it is not anticipated that the patient will be medically stable for discharge from the hospital within 2 midnights of admission.   * I certify that at the point of admission it is my clinical judgment that the patient will require inpatient hospital care spanning beyond 2 midnights from the point of admission due to high intensity of service, high risk for further deterioration and high  frequency of surveillance required.*  Author: Orma Flaming, MD 10/25/2021 7:15 PM  For on call review [email or web address].

## 2021-10-25 NOTE — Assessment & Plan Note (Addendum)
Continue zetia and crestor  ?

## 2021-10-25 NOTE — Assessment & Plan Note (Addendum)
-  recently admitted for acute on chronic renal failure with creatinine up to 3.7 on 2/21, creatinine of 2.7 today, baseline 2.2-2.5 after further reviewing nephrology notes   ?-does not use NSAIDs ?-UA suspicious for UTI and is hypotensive  ?-checking renal US  ?-continue gentle, time limited IVF ?-holding nephrotoxic drugs (lasix/lisinopril) ?-strict I/O ?-trend  ?

## 2021-10-26 DIAGNOSIS — F3010 Manic episode without psychotic symptoms, unspecified: Secondary | ICD-10-CM

## 2021-10-26 DIAGNOSIS — U071 COVID-19: Secondary | ICD-10-CM

## 2021-10-26 DIAGNOSIS — N3000 Acute cystitis without hematuria: Secondary | ICD-10-CM | POA: Diagnosis not present

## 2021-10-26 LAB — CBC
HCT: 24.6 % — ABNORMAL LOW (ref 36.0–46.0)
Hemoglobin: 8.2 g/dL — ABNORMAL LOW (ref 12.0–15.0)
MCH: 28.9 pg (ref 26.0–34.0)
MCHC: 33.3 g/dL (ref 30.0–36.0)
MCV: 86.6 fL (ref 80.0–100.0)
Platelets: 187 10*3/uL (ref 150–400)
RBC: 2.84 MIL/uL — ABNORMAL LOW (ref 3.87–5.11)
RDW: 13.2 % (ref 11.5–15.5)
WBC: 5.6 10*3/uL (ref 4.0–10.5)
nRBC: 0 % (ref 0.0–0.2)

## 2021-10-26 LAB — LACTIC ACID, PLASMA: Lactic Acid, Venous: 1.5 mmol/L (ref 0.5–1.9)

## 2021-10-26 LAB — GLUCOSE, CAPILLARY
Glucose-Capillary: 147 mg/dL — ABNORMAL HIGH (ref 70–99)
Glucose-Capillary: 151 mg/dL — ABNORMAL HIGH (ref 70–99)
Glucose-Capillary: 88 mg/dL (ref 70–99)

## 2021-10-26 LAB — TROPONIN I (HIGH SENSITIVITY)
Troponin I (High Sensitivity): 14 ng/L (ref <18)
Troponin I (High Sensitivity): 15 ng/L (ref <18)

## 2021-10-26 LAB — BASIC METABOLIC PANEL
Anion gap: 12 (ref 5–15)
BUN: 52 mg/dL — ABNORMAL HIGH (ref 8–23)
CO2: 23 mmol/L (ref 22–32)
Calcium: 7.8 mg/dL — ABNORMAL LOW (ref 8.9–10.3)
Chloride: 102 mmol/L (ref 98–111)
Creatinine, Ser: 3.21 mg/dL — ABNORMAL HIGH (ref 0.44–1.00)
GFR, Estimated: 15 mL/min — ABNORMAL LOW (ref >60)
Glucose, Bld: 174 mg/dL — ABNORMAL HIGH (ref 70–99)
Potassium: 4 mmol/L (ref 3.5–5.1)
Sodium: 137 mmol/L (ref 135–145)

## 2021-10-26 MED ORDER — MOLNUPIRAVIR EUA 200MG CAPSULE
4.0000 | ORAL_CAPSULE | Freq: Two times a day (BID) | ORAL | Status: DC
  Administered 2021-10-26 – 2021-10-28 (×5): 800 mg via ORAL
  Filled 2021-10-26 (×2): qty 4

## 2021-10-26 MED ORDER — FAMOTIDINE 20 MG PO TABS
20.0000 mg | ORAL_TABLET | Freq: Every day | ORAL | Status: DC
Start: 2021-10-27 — End: 2021-10-28
  Administered 2021-10-27 – 2021-10-28 (×2): 20 mg via ORAL
  Filled 2021-10-26 (×2): qty 1

## 2021-10-26 MED ORDER — SODIUM CHLORIDE 0.9 % IV SOLN: INTRAVENOUS | Status: DC

## 2021-10-26 NOTE — Progress Notes (Signed)
Ok to reduce famotidine to '20mg'$  PO qday due to renal function per Dr. Doristine Bosworth. ? ?Onnie Boer, PharmD, BCIDP, AAHIVP, CPP ?Infectious Disease Pharmacist ?10/26/2021 1:40 PM ? ? ?

## 2021-10-26 NOTE — Consult Note (Signed)
Inpatient psychiatry consultation   Reason for Consult: Assess suicide risk Referring Physician:  Orma Flaming Location of Patient: [facility] Location of Provider: [place]   Patient Identification: Teresa Poole MRN:  [id number] Principal Diagnosis: UTI (urinary tract infection) Diagnosis:  Principal Problem:   UTI (urinary tract infection) Active Problems:   Uncontrolled hypertension   Type 2 diabetes mellitus with chronic kidney disease, without long-term current use of insulin (HCC)   Chronic combined systolic and diastolic heart failure (HCC)   Mixed hyperlipidemia   Acute renal failure superimposed on stage 4 chronic kidney disease ([place])   COVID-19 virus infection   Anemia   Total Time Spent in Direct Patient Care:  I personally spent 45 minutes on the unit in direct patient care. The direct patient care time included face-to-face time with the patient, reviewing the patient's chart, communicating with other professionals, and coordinating care. Greater than 50% of this time was spent in counseling or coordinating care with the patient regarding goals of hospitalization, psycho-education, and discharge planning needs.   Subjective: "I felt so bad, and was so dizzy.  I am so happy to be here."  HPI:  Teresa Poole is a 71 y.o. female patient with medical history significant of CKD IV, NICM with EF of 55-60% and grade 1 diastolic dysfunction, HTN, T2DM, anemia, memory issues who presented to ED on 10/25/2021 with complaints of dizziness and weakness.  She states today when she tried to stand up she got dizzy and felt like she was going to pass out. She also has lower back pain that feels like "muscle fatigue" like she ran 10 miles and it radiates down into her legs. She only has this when she stands up. She also has a  tingling in her feet. This all started today.  She denied any fever/chills, very slight h/a, no chest pain or  palpitations but she does have chest pressure, no shortness of breath or coughing, no stomach pain, no N/V/D, she does have some dysuria and some burning without urination, no urgency or frequency. She does have some incontinence that is new, no leg swelling. Denies any AH/VH/SI/HI.  Recently admitted on 10/14/21 for acute on chronic renal disease with creatinine of 3.7 at time of admit and concerns for UTI; however, she left AMA early on 10/15/21.  She tested positive for COVID on Thursday and was started on paxlovid Thursday and Friday, didn't take today. Has had COVID vaccine x 2 with pfizer and one booster  Patient had a suicide screening completed which was positive for suicide risk.  Psychiatry was consulted to further evaluate suicidal risk.  On evaluation by psychiatry today, patient is alert and oriented.  She states that she thinks she had a reaction to the medications that she was prescribed for COVID-19.  She describes that she is feeling better today.  Patient denies any past history of self-harm.  She denies any past suicide attempts.  She specifically denies any suicidal ideation, plan, or intent.  She denies any homicidal ideation.  She denies any auditory or visual hallucinations, and does not appear to be responding to internal stimuli.  Patient states that she lives alone, but has support in the community with family members and church members.  Patient is rambling and somewhat tangential in her speech, likely due to being on COVID-19 isolation with limited interactions with staff.  She states that "I am fine, I have all the soccer games on to entertain  me."  She states that she is eating well and sleeping well.  She denies drinking alcohol or using illicit drugs.  In discussing medications with patient, she states "I am already on 12 medications, if you told me you would have to add another medication, that might make me suicidal."  She laughs after this, and notes that she is just  kidding.  Record review documents that patient was likely prescribed prednisone with recent COVID-19 infection.  This certainly could account for some hypomanic/manic symptoms with hyperverbal presentation and agitation.   Past Psychiatric History: Adjustment disorder with anxiety. Possible mild cognitive impairment.  Risk to Self:  denies  Risk to Others:  denies  Prior Inpatient Therapy:  denies  Prior Outpatient Therapy:  denies   Past Medical History:  Past Medical History:  Diagnosis Date   Allergy    Anemia    Carotid stenosis    Chronic combined systolic and diastolic CHF (congestive heart failure) (HCC)    CKD (chronic kidney disease), stage IV ([place]) 09/24/2013   Pt at [place], Dr. Joelyn Oms   COVID-19 10/24/2021   Diabetes mellitus type 2 in nonobese [facility])    Edema 09/14/2013   GERD (gastroesophageal reflux disease)    History of ovarian cancer    Hyperlipidemia    Hypertension    Mild CAD    non-obstructive by LHC (09/25/2013): Proximal and mid LAD serial 20%, proximal circumflex 30%, mid AV groove circumflex 30%, mid RCA mild plaque.   NICM (nonischemic cardiomyopathy) (HCC)    Obesity (BMI 30-39.9)    RBBB    Renal artery stenosis ([place]) 10/11/2018   Thyroid disease    Seen by specialist    Past Surgical History:  Procedure Laterality Date   LEFT HEART CATHETERIZATION WITH CORONARY ANGIOGRAM N/A 09/25/2013   Procedure: LEFT HEART CATHETERIZATION WITH CORONARY ANGIOGRAM;  Surgeon: Burnell Blanks, MD;  Location: [facility] CATH LAB;  Service: Cardiovascular;  Laterality: N/A;   RENAL ANGIOGRAPHY N/A 10/06/2018   Procedure: RENAL ANGIOGRAPHY;  Surgeon: Marty Heck, MD;  Location: [place] CV LAB;  Service: Cardiovascular;  Laterality: N/A;   Family History:  Family History  Problem Relation Age of Onset   Diabetes Father    Hypertension Father    Hypertension Maternal Grandfather    Family Psychiatric  History: per chart review: Pt had cousin  murdered as infant (aunt with PPD) Cousins (sons of aunt with PPD) with lifelong mental health struggles Some 3rd and 4th cousins with unknown mental health struggles.  Social History:  Social History   Substance and Sexual Activity  Alcohol Use No     Social History   Substance and Sexual Activity  Drug Use No    Social History   Socioeconomic History   Marital status: Single    Spouse name: Not on file   Number of children: 0   Years of education: Not on file   Highest education level: Not on file  Occupational History    Employer: [employer]  Tobacco Use   Smoking status: Never   Smokeless tobacco: Never  Vaping Use   Vaping Use: Never used  Substance and Sexual Activity   Alcohol use: No   Drug use: No   Sexual activity: Not on file  Other Topics Concern   Not on file  Social History Narrative   Works at [employer]   Patient lives at home alone.    Patient has no children.    Patient  patient is right handed.    Patient is single.    Social Determinants of Health   Financial Resource Strain: Not on file  Food Insecurity: Not on file  Transportation Needs: Not on file  Physical Activity: Not on file  Stress: Not on file  Social Connections: Not on file   Additional Social History:    Allergies:   Allergies  Allergen Reactions   Hydralazine Hcl Itching    ENTIRE BODY = burning sensation, also in the eyes (they have become very sensitive to light)   Atorvastatin Other (See Comments)    Per MD - pt not sure of reaction     Labs:  Results for orders placed or performed during the hospital encounter of 10/25/21 (from the past 48 hour(s))  Basic metabolic panel     Status: Abnormal   Collection Time: 10/25/21  9:46 AM  Result Value Ref Range   Sodium 134 (L) 135 - 145 mmol/L   Potassium 4.5 3.5 - 5.1 mmol/L   Chloride 98 98 - 111 mmol/L   CO2 22 22 - 32 mmol/L   Glucose, Bld 283 (H) 70 - 99 mg/dL    Comment: Glucose reference range applies only  to samples taken after fasting for at least 8 hours.   BUN 45 (H) 8 - 23 mg/dL   Creatinine, Ser 2.78 (H) 0.44 - 1.00 mg/dL   Calcium 8.4 (L) 8.9 - 10.3 mg/dL   GFR, Estimated 18 (L) >60 mL/min    Comment: (NOTE) Calculated using the CKD-EPI Creatinine Equation (2021)    Anion gap 14 5 - 15    Comment: Performed at [facility], [address] [place], [place] [address]  CBC     Status: Abnormal   Collection Time: 10/25/21  9:46 AM  Result Value Ref Range   WBC 6.3 4.0 - 10.5 K/uL   RBC 3.26 (L) 3.87 - 5.11 MIL/uL   Hemoglobin 9.5 (L) 12.0 - 15.0 g/dL   HCT 28.4 (L) 36.0 - 46.0 %   MCV 87.1 80.0 - 100.0 fL   MCH 29.1 26.0 - 34.0 pg   MCHC 33.5 30.0 - 36.0 g/dL   RDW 13.3 11.5 - 15.5 %   Platelets 230 150 - 400 K/uL   nRBC 0.0 0.0 - 0.2 %    Comment: Performed at [facility], [address] [place], [place] [address]  Troponin I (High Sensitivity)     Status: None   Collection Time: 10/25/21  9:46 AM  Result Value Ref Range   Troponin I (High Sensitivity) 14 <18 ng/L    Comment: (NOTE) Elevated high sensitivity troponin I (hsTnI) values and significant  changes across serial measurements may suggest ACS but many other  chronic and acute conditions are known to elevate hsTnI results.  Refer to the "Links" section for chest pain algorithms and additional  guidance. Performed at [facility], [address] [place], [place] [address]   Hepatic function panel     Status: Abnormal   Collection Time: 10/25/21  9:46 AM  Result Value Ref Range   Total Protein 6.9 6.5 - 8.1 g/dL   Albumin 3.2 (L) 3.5 - 5.0 g/dL   AST 26 15 - 41 U/L   ALT 16 0 - 44 U/L   Alkaline Phosphatase 48 38 - 126 U/L   Total Bilirubin 0.5 0.3 - 1.2 mg/dL   Bilirubin, Direct 0.2 0.0 - 0.2 mg/dL  Indirect Bilirubin 0.3 0.3 - 0.9 mg/dL    Comment: Performed at [facility], [address] [place], [place] [address]  Lactic acid, plasma     Status: Abnormal    Collection Time: 10/25/21  9:46 AM  Result Value Ref Range   Lactic Acid, Venous 2.2 (HH) 0.5 - 1.9 mmol/L    Comment: CRITICAL RESULT CALLED TO, READ BACK BY AND VERIFIED WITH:  GOUGE,S RN '@[id number]'$  10/25/21 EDENSCA Performed at [facility], [address] [place], [place] [address]   Resp Panel by RT-PCR (Flu A&B, Covid) Nasopharyngeal Swab     Status: Abnormal   Collection Time: 10/25/21  9:46 AM   Specimen: Nasopharyngeal Swab; Nasopharyngeal(NP) swabs in vial transport medium  Result Value Ref Range   SARS Coronavirus 2 by RT PCR POSITIVE (A) NEGATIVE    Comment: (NOTE) SARS-CoV-2 target nucleic acids are DETECTED.  The SARS-CoV-2 RNA is generally detectable in upper respiratory specimens during the acute phase of infection. Positive results are indicative of the presence of the identified virus, but do not rule out bacterial infection or co-infection with other pathogens not detected by the test. Clinical correlation with patient history and other diagnostic information is necessary to determine patient infection status. The expected result is Negative.  Fact Sheet for Patients: EntrepreneurPulse.com.au  Fact Sheet for Healthcare Providers: IncredibleEmployment.be  This test is not yet approved or cleared by the [place] FDA and  has been authorized for detection and/or diagnosis of SARS-CoV-2 by FDA under an Emergency Use Authorization (EUA).  This EUA will remain in effect (meaning this test can be used) for the duration of  the COVID-19 declaration under Section 564(b)(1) of the A ct, 21 U.S.C. section 360bbb-3(b)(1), unless the authorization is terminated or revoked sooner.     Influenza A by PCR NEGATIVE NEGATIVE   Influenza B by PCR NEGATIVE NEGATIVE    Comment: (NOTE) The Xpert Xpress SARS-CoV-2/FLU/RSV plus assay is intended as an aid in the diagnosis of influenza from Nasopharyngeal swab specimens and should not  be used as a sole basis for treatment. Nasal washings and aspirates are unacceptable for Xpert Xpress SARS-CoV-2/FLU/RSV testing.  Fact Sheet for Patients: EntrepreneurPulse.com.au  Fact Sheet for Healthcare Providers: IncredibleEmployment.be  This test is not yet approved or cleared by the [place] FDA and has been authorized for detection and/or diagnosis of SARS-CoV-2 by FDA under an Emergency Use Authorization (EUA). This EUA will remain in effect (meaning this test can be used) for the duration of the COVID-19 declaration under Section 564(b)(1) of the Act, 21 U.S.C. section 360bbb-3(b)(1), unless the authorization is terminated or revoked.  Performed at [facility], [address] [place], [place] [address]   Lactic acid, plasma     Status: Abnormal   Collection Time: 10/25/21 12:11 PM  Result Value Ref Range   Lactic Acid, Venous 2.9 (HH) 0.5 - 1.9 mmol/L    Comment: CRITICAL VALUE NOTED.  VALUE IS CONSISTENT WITH PREVIOUSLY REPORTED AND CALLED VALUE. EDENSCA Performed at [facility], [address] [place], [place] [address]   Troponin I (High Sensitivity)     Status: None   Collection Time: 10/25/21 12:11 PM  Result Value Ref Range   Troponin I (High Sensitivity) 12 <18 ng/L    Comment: (NOTE) Elevated high sensitivity troponin I (hsTnI) values and significant  changes across serial measurements may suggest ACS but many other  chronic and acute conditions  are known to elevate hsTnI results.  Refer to the "Links" section for chest pain algorithms and additional  guidance. Performed at [facility], [address] [place], [place] [address]   Urinalysis, Routine w reflex microscopic Nasopharyngeal Swab     Status: Abnormal   Collection Time: 10/25/21  1:05 PM  Result Value Ref Range   Color, Urine YELLOW YELLOW   APPearance CLEAR CLEAR   Specific Gravity, Urine 1.025 1.005 - 1.030   pH  5.0 5.0 - 8.0   Glucose, UA NEGATIVE NEGATIVE mg/dL   Hgb urine dipstick TRACE (A) NEGATIVE   Bilirubin Urine NEGATIVE NEGATIVE   Ketones, ur NEGATIVE NEGATIVE mg/dL   Protein, ur 100 (A) NEGATIVE mg/dL   Nitrite NEGATIVE NEGATIVE   Leukocytes,Ua SMALL (A) NEGATIVE    Comment: Performed at [facility], [address] [place], [place] [address]  Urinalysis, Microscopic (reflex)     Status: Abnormal   Collection Time: 10/25/21  1:05 PM  Result Value Ref Range   RBC / HPF 0-5 0 - 5 RBC/hpf   WBC, UA 0-5 0 - 5 WBC/hpf   Bacteria, UA MANY (A) NONE SEEN   Squamous Epithelial / LPF 0-5 0 - 5   Mucus PRESENT    Hyaline Casts, UA PRESENT     Comment: Performed at [facility], [address] [place], [place] [address]  Blood culture (routine x 2)     Status: None (Preliminary result)   Collection Time: 10/25/21  1:14 PM   Specimen: BLOOD LEFT WRIST  Result Value Ref Range   Specimen Description      BLOOD LEFT WRIST Performed at [facility], [address] [place], [place] [address]    Special Requests      BOTTLES DRAWN AEROBIC AND ANAEROBIC Blood Culture adequate volume Performed at [facility], [address] [place], [place] [address]    Culture      NO GROWTH < 24 HOURS Performed at [facility], [place] [address] [place], [place] [address]    Report Status PENDING   Blood culture (routine x 2)     Status: None (Preliminary result)   Collection Time: 10/25/21  1:22 PM   Specimen: BLOOD RIGHT HAND  Result Value Ref Range   Specimen Description      BLOOD RIGHT HAND Performed at [facility], [address] [place], [place] [address]    Special Requests      BOTTLES DRAWN AEROBIC AND ANAEROBIC Blood Culture adequate volume Performed at [facility], [address] [place], [place] [address]    Culture      NO GROWTH < 24 HOURS Performed at [facility], [place] [address] [place], [place]  [address]    Report Status PENDING   Glucose, capillary     Status: Abnormal   Collection Time: 10/25/21  5:26 PM  Result Value Ref Range   Glucose-Capillary 186 (H) 70 - 99 mg/dL    Comment: Glucose reference range applies only to samples taken after fasting for at least 8 hours.  Glucose, capillary     Status: Abnormal   Collection Time: 10/25/21  5:50 PM  Result Value Ref Range   Glucose-Capillary 208 (H) 70 - 99 mg/dL    Comment: Glucose reference range applies only to samples taken after fasting for at least 8 hours.  Lactic  acid, plasma     Status: None   Collection Time: 10/25/21  6:27 PM  Result Value Ref Range   Lactic Acid, Venous 1.7 0.5 - 1.9 mmol/L    Comment: Performed at [facility], [place] [address] [place], [place] [address]  C-reactive protein     Status: Abnormal   Collection Time: 10/25/21  6:27 PM  Result Value Ref Range   CRP 1.4 (H) <1.0 mg/dL    Comment: Performed at [facility], [place] [address], [place], [place] [address]  D-dimer, quantitative     Status: Abnormal   Collection Time: 10/25/21  6:27 PM  Result Value Ref Range   D-Dimer, Quant 0.70 (H) 0.00 - 0.50 ug/mL-FEU    Comment: (NOTE) At the manufacturer cut-off value of 0.5 g/mL FEU, this assay has a negative predictive value of 95-100%.This assay is intended for use in conjunction with a clinical pretest probability (PTP) assessment model to exclude pulmonary embolism (PE) and deep venous thrombosis (DVT) in outpatients suspected of PE or DVT. Results should be correlated with clinical presentation. Performed at [facility], [place] [address], [place], [place] [address]   Ferritin     Status: None   Collection Time: 10/25/21  6:27 PM  Result Value Ref Range   Ferritin 74 11 - 307 ng/mL    Comment: Performed at [place] [address] [place], [place] [address]  Fibrinogen     Status: None   Collection Time: 10/25/21  6:27 PM  Result Value Ref Range   Fibrinogen 411  210 - 475 mg/dL    Comment: (NOTE) Fibrinogen results may be underestimated in patients receiving thrombolytic therapy. Performed at [facility], [place] [address], [place], [place] [address]   Lactate dehydrogenase     Status: None   Collection Time: 10/25/21  6:27 PM  Result Value Ref Range   LDH 165 98 - 192 U/L    Comment: Performed at [facility], [place] [address], [place],  [address]  Procalcitonin     Status: None   Collection Time: 10/25/21  6:27 PM  Result Value Ref Range   Procalcitonin 0.30 ng/mL    Comment:        Interpretation: PCT (Procalcitonin) <= 0.5 ng/mL: Systemic infection (sepsis) is not likely. Local bacterial infection is possible. (NOTE)       Sepsis PCT Algorithm           Lower Respiratory Tract                                      Infection PCT Algorithm    ----------------------------     ----------------------------         PCT < 0.25 ng/mL                PCT < 0.10 ng/mL          Strongly encourage             Strongly discourage   discontinuation of antibiotics    initiation of antibiotics    ----------------------------     -----------------------------       PCT 0.25 - 0.50 ng/mL            PCT 0.10 - 0.25 ng/mL               OR       >  80% decrease in PCT            Discourage initiation of                                            antibiotics      Encourage discontinuation           of antibiotics    ----------------------------     -----------------------------         PCT >= 0.50 ng/mL              PCT 0.26 - 0.50 ng/mL               AND        <80% decrease in PCT             Encourage initiation of                                             antibiotics       Encourage continuation           of antibiotics    ----------------------------     -----------------------------        PCT >= 0.50 ng/mL                  PCT > 0.50 ng/mL               AND         increase in PCT                  Strongly encourage                                       initiation of antibiotics    Strongly encourage escalation           of antibiotics                                     -----------------------------                                           PCT <= 0.25 ng/mL                                                 OR                                        > 80% decrease in PCT                                      Discontinue / Do not initiate  antibiotics  Performed at [facility], [place] [address] [place], [place] [address]   Glucose, capillary     Status: Abnormal   Collection Time: 10/25/21  8:17 PM  Result Value Ref Range   Glucose-Capillary 185 (H) 70 - 99 mg/dL    Comment: Glucose reference range applies only to samples taken after fasting for at least 8 hours.  Lactic acid, plasma     Status: Abnormal   Collection Time: 10/25/21  9:22 PM  Result Value Ref Range   Lactic Acid, Venous 3.1 (HH) 0.5 - 1.9 mmol/L    Comment: CRITICAL RESULT CALLED TO, READ BACK BY AND VERIFIED WITH:  [facility] J,RN 10/25/21 2213 [place] Performed at [place] [address] [place], [place] [address]   Troponin I (High Sensitivity)     Status: None   Collection Time: 10/25/21 11:38 PM  Result Value Ref Range   Troponin I (High Sensitivity) 14 <18 ng/L    Comment: (NOTE) Elevated high sensitivity troponin I (hsTnI) values and significant  changes across serial measurements may suggest ACS but many other  chronic and acute conditions are known to elevate hsTnI results.  Refer to the "Links" section for chest pain algorithms and additional  guidance. Performed at [facility], [place] [address], [place], [place] [address]   Basic metabolic panel     Status: Abnormal   Collection Time: 10/26/21  2:28 AM  Result Value Ref Range   Sodium 137 135 - 145 mmol/L   Potassium 4.0 3.5 - 5.1 mmol/L   Chloride 102 98 - 111 mmol/L   CO2 23 22 - 32 mmol/L   Glucose, Bld  174 (H) 70 - 99 mg/dL    Comment: Glucose reference range applies only to samples taken after fasting for at least 8 hours.   BUN 52 (H) 8 - 23 mg/dL   Creatinine, Ser 3.21 (H) 0.44 - 1.00 mg/dL   Calcium 7.8 (L) 8.9 - 10.3 mg/dL   GFR, Estimated 15 (L) >60 mL/min    Comment: (NOTE) Calculated using the CKD-EPI Creatinine Equation (2021)    Anion gap 12 5 - 15    Comment: Performed at [place] [address], [place],  [address]  CBC     Status: Abnormal   Collection Time: 10/26/21  2:28 AM  Result Value Ref Range   WBC 5.6 4.0 - 10.5 K/uL   RBC 2.84 (L) 3.87 - 5.11 MIL/uL   Hemoglobin 8.2 (L) 12.0 - 15.0 g/dL   HCT 24.6 (L) 36.0 - 46.0 %   MCV 86.6 80.0 - 100.0 fL   MCH 28.9 26.0 - 34.0 pg   MCHC 33.3 30.0 - 36.0 g/dL   RDW 13.2 11.5 - 15.5 %   Platelets 187 150 - 400 K/uL   nRBC 0.0 0.0 - 0.2 %    Comment: Performed at [facility], [place] [address] [place], [place] [address]  Lactic acid, plasma     Status: None   Collection Time: 10/26/21  2:28 AM  Result Value Ref Range   Lactic Acid, Venous 1.5 0.5 - 1.9 mmol/L    Comment: Performed at [facility] [address], [place], [place] [address]  Troponin I (High Sensitivity)     Status: None   Collection Time: 10/26/21  2:28 AM  Result Value Ref Range   Troponin I (High Sensitivity) 15 <18 ng/L    Comment: (NOTE) Elevated high  sensitivity troponin I (hsTnI) values and significant  changes across serial measurements may suggest ACS but many other  chronic and acute conditions are known to elevate hsTnI results.  Refer to the "Links" section for chest pain algorithms and additional  guidance. Performed at [facility], [place] [address] [place], [place] [address]   Glucose, capillary     Status: Abnormal   Collection Time: 10/26/21  8:03 AM  Result Value Ref Range   Glucose-Capillary 147 (H) 70 - 99 mg/dL    Comment: Glucose reference range applies only to samples taken after fasting  for at least 8 hours.  Glucose, capillary     Status: Abnormal   Collection Time: 10/26/21 11:43 AM  Result Value Ref Range   Glucose-Capillary 151 (H) 70 - 99 mg/dL    Comment: Glucose reference range applies only to samples taken after fasting for at least 8 hours.    Medications:  Current Facility-Administered Medications  Medication Dose Route Frequency Provider Last Rate Last Admin   0.9 %  sodium chloride infusion   Intravenous Continuous Darliss Cheney, MD 50 mL/hr at 10/26/21 1510 New Bag at 10/26/21 1510   acetaminophen (TYLENOL) tablet 650 mg  650 mg Oral Q6H PRN Orma Flaming, MD       Or   acetaminophen (TYLENOL) suppository 650 mg  650 mg Rectal Q6H PRN Orma Flaming, MD       albuterol (VENTOLIN HFA) 108 (90 Base) MCG/ACT inhaler 2 puff  2 puff Inhalation Q4H PRN Orma Flaming, MD       cefTRIAXone (ROCEPHIN) 1 g in sodium chloride 0.9 % 100 mL IVPB  1 g Intravenous Q24H Orma Flaming, MD 200 mL/hr at 10/26/21 1513 1 g at 10/26/21 1513   cycloSPORINE (RESTASIS) 0.05 % ophthalmic emulsion 1 drop  1 drop Both Eyes BID Orma Flaming, MD   1 drop at 10/26/21 1057   enoxaparin (LOVENOX) injection 30 mg  30 mg Subcutaneous Q24H Orma Flaming, MD   30 mg at 10/25/21 2019   ezetimibe (ZETIA) tablet 10 mg  10 mg Oral Daily Orma Flaming, MD   10 mg at 10/26/21 1056   [START ON 10/27/2021] famotidine (PEPCID) tablet 20 mg  20 mg Oral Daily Pham, Minh Q, RPH-CPP       hydrOXYzine (ATARAX) tablet 10 mg  10 mg Oral TID PRN Orma Flaming, MD       insulin aspart (novoLOG) injection 0-6 Units  0-6 Units Subcutaneous TID WC Orma Flaming, MD   1 Units at 10/26/21 1250   molnupiravir EUA (LAGEVRIO) capsule 800 mg  4 capsule Oral BID Darliss Cheney, MD   800 mg at 10/26/21 1046   nitroGLYCERIN (NITROSTAT) SL tablet 0.4 mg  0.4 mg Sublingual Q5 min PRN Opyd, Ilene Qua, MD   0.4 mg at 10/25/21 2240   OLANZapine zydis (ZYPREXA) disintegrating tablet 5 mg  5 mg Oral Daily Orma Flaming, MD   5  mg at 10/26/21 1057   ondansetron (ZOFRAN) tablet 4 mg  4 mg Oral Q6H PRN Orma Flaming, MD       Or   ondansetron [facility]) injection 4 mg  4 mg Intravenous Q6H PRN Orma Flaming, MD       oxyCODONE (Oxy IR/ROXICODONE) immediate release tablet 5 mg  5 mg Oral Once PRN Opyd, Ilene Qua, MD       pantoprazole (PROTONIX) EC tablet 80 mg  80 mg Oral Daily Orma Flaming, MD   80 mg at 10/26/21  1057   rosuvastatin (CRESTOR) tablet 20 mg  20 mg Oral Daily Orma Flaming, MD   20 mg at 10/26/21 1057    Psychiatric Specialty Exam:  Presentation  General Appearance: Neat; Appropriate for Environment  Eye Contact:Good  Speech:Normal Rate  Speech Volume:Normal  Handedness:Right   Mood and Affect  Mood:Euthymic  Affect:Congruent   Thought Process  Thought Processes:Linear  Descriptions of Associations:Tangential  Orientation:Full (Time, Place and Person)  Thought Content:Logical  History of Schizophrenia/Schizoaffective disorder:No data recorded Duration of Psychotic Symptoms:No data recorded Hallucinations:Hallucinations: None   Ideas of Reference:None  Suicidal Thoughts:Suicidal Thoughts: No   Homicidal Thoughts:Homicidal Thoughts: No    Sensorium  Memory:Remote Good; Immediate Fair; Recent Fair  Judgment:Fair  Insight:Fair   Executive Functions  Concentration:Good  Attention Span:Good  [place] of Knowledge:Good  Language:Good   Psychomotor Activity  Psychomotor Activity:Psychomotor Activity: Normal    Assets  Assets:Communication Skills; Desire for Improvement; Housing; Social Support; Resilience   Sleep  Sleep:Sleep: Good     Physical Exam: Physical Exam Constitutional:      Appearance: Normal appearance.  HENT:     Head: Normocephalic.  Cardiovascular:     Rate and Rhythm: Normal rate.  Pulmonary:     Effort: Pulmonary effort is normal.  Neurological:     Mental Status: She is alert.   Review of Systems   Constitutional: Negative.   HENT: Negative.    Eyes: Negative.   Respiratory: Negative.    Cardiovascular: Negative.   Gastrointestinal: Negative.   Genitourinary: Negative.   Musculoskeletal: Negative.   Skin: Negative.   Neurological: Negative.   Endo/Heme/Allergies: Negative.   Psychiatric/Behavioral:  Negative for depression, hallucinations, substance abuse and suicidal ideas. The patient is not nervous/anxious and does not have insomnia.   Blood pressure (!) 105/56, pulse 71, temperature 98.5 F (36.9 C), temperature source Oral, resp. rate 18, height '5\' 7"'$  (1.702 m), weight 71.9 kg, SpO2 100 %. Body mass index is 24.83 kg/m.  Treatment Plan Summary: Follow up with Neurology due to possible concerns for dementia.  Follow up with Neuropsychiatrics for outpatient psychiatry. Safety plan with Shellia Carwin (pt's sister) prior to discharge.   Disposition: Patient does not meet criteria for psychiatric inpatient admission.  If there has not been any documented cognitive assessment, would recommend obtaining her baseline.  Per record review, patient has been started on Zyprexa 5 mg daily.  This can be continued at this time to decrease manic symptoms likely related to steroid use.  Given Patient's history of diabetes, dyslipidemia and heart disease, I would recommend weaning patient off of Zyprexa prior to discharge.  Psychiatry can continue to follow to ensure she is psychiatrically stable at time of discharge.  Recommend obtaining collateral from patient's sister Kay Shippy and to establish a safety plan due to patient living alone, forgetful and confused at baseline.     Lavella Hammock, MD 10/26/2021 3:53 PM

## 2021-10-26 NOTE — Evaluation (Signed)
Physical Therapy Evaluation ?Patient Details ?Name: Teresa Poole ?MRN: [id number] ?DOB: [birth year] ?Today's Date: 10/26/2021 ? ?History of Present Illness ? The pt is a 71 yo female presenting 3/4 with weakness and COVID +. Pt also found to have UTI upon work up. PMH includes: CKD IV, CHF, HTN, DM II, anemia, memory issues. ?  ?Clinical Impression ? Pt in bed upon arrival of PT, agreeable to evaluation at this time. Prior to admission the pt was independent with mobility, living alone in a condo with 15 stairs to enter. The pt reports full independence, driving for errands, walking in the park for activity. The pt was able to complete all bed mobility, sit-stand transfers, and gait in the room without need for assist and with BP stable (see below). Assist for line management only, pt otherwise able to complete gait in room with quick changes in direction and turns without instability, and no need for UE support. Given performance today, recommend d/c home without need for follow up PT, pt encouraged to maintain gentle activity in the home while recovering from [place]. Given noted fluctuations in cognition (see RN notes from earlier this morning), will follow up for additional visit to ensure pt safe for return home.    ?   ? ?Recommendations for follow up therapy are one component of a multi-disciplinary discharge planning process, led by the attending physician.  Recommendations may be updated based on patient status, additional functional criteria and insurance authorization. ? ?Follow Up Recommendations No PT follow up ? ?  ?Assistance Recommended at Discharge Intermittent Supervision/Assistance  ?Patient can return home with the following ?   ? ?  ?Equipment Recommendations None recommended by PT  ?Recommendations for Other Services ?    ?  ?Functional Status Assessment Patient has had a recent decline in their functional status and demonstrates the ability to make significant improvements in function in a  reasonable and predictable amount of time.  ? ?  ?Precautions / Restrictions Precautions ?Precaution Comments: covid on precautions ?Restrictions ?Weight Bearing Restrictions: No  ? ?  ? ?Mobility ? Bed Mobility ?Overal bed mobility: Independent ?  ?  ?  ?  ?  ?  ?General bed mobility comments: no assist, line management only ?  ? ?Transfers ?Overall transfer level: Needs assistance ?Equipment used: None ?Transfers: Sit to/from Stand ?Sit to Stand: Supervision ?  ?  ?  ?  ?  ?General transfer comment: able to stand without assist or instability multiple times from low surfaces without UE support ?  ? ?Ambulation/Gait ?Ambulation/Gait assistance: Supervision ?Gait Distance (Feet): 40 Feet (+ 40) ?Assistive device: None ?Gait Pattern/deviations: WFL(Within Functional Limits) ?Gait velocity: WFL ?  ?  ?General Gait Details: no overt LOB, BP stable, pt able to navigate in room with quick turns and no LOB ? ?  ? ?Balance Overall balance assessment: No apparent balance deficits (not formally assessed) ?  ?  ?  ?  ?  ?  ?  ?  ?  ?  ?  ?  ?  ?  ?  ?  ?  ?  ?   ? ? ? ?Pertinent Vitals/Pain Pain Assessment ?Pain Assessment: No/denies pain  ? ? ?Home Living Family/patient expects to be discharged to:: Private residence ?Living Arrangements: Alone ?Available Help at Discharge: Family;Available PRN/intermittently ?Type of Home: Apartment ?Home Access: Stairs to enter ?Entrance Stairs-Rails: Right ?Entrance Stairs-Number of Steps: 15 ?  ?Home Layout: One level ?Home Equipment: Kasandra Knudsen - single point ?   ?  ?  Prior Function Prior Level of Function : Independent/Modified Independent;Driving ?  ?  ?  ?  ?  ?  ?Mobility Comments: pt reports driving ofte, no recent falls, uses cane for walking in the park ?ADLs Comments: pt reports full independence ?  ? ? ?Hand Dominance  ? Dominant Hand: Right ? ?  ?Extremity/Trunk Assessment  ? Upper Extremity Assessment ?Upper Extremity Assessment: Defer to OT evaluation ?  ? ?Lower Extremity  Assessment ?Lower Extremity Assessment: Overall WFL for tasks assessed (grossly 4+/5 with no focal weakness, decreased sensation bilaterally below ankle) ?  ? ?Cervical / Trunk Assessment ?Cervical / Trunk Assessment: Normal  ?Communication  ? Communication: No difficulties  ?Cognition Arousal/Alertness: Awake/alert ?Behavior During Therapy: [facility] for tasks assessed/performed ?Overall Cognitive Status: Within Functional Limits for tasks assessed ?  ?  ?  ?  ?  ?  ?  ?  ?  ?  ?  ?  ?  ?  ?  ?  ?General Comments: noted fluctuation in mental status overnight, pt with no hallucinations at this time, able to demo good orientation, answers all questions appropriately, and follow all instructions and cues ?  ?  ? ?  ?General Comments General comments (skin integrity, edema, etc.): VSS on RA, BP stable with all changes in position (MAP 76-83) with pt asymptomatic ? ?  ?Exercises    ? ?Assessment/Plan  ?  ?PT Assessment Patient needs continued PT services  ?PT Problem List Decreased balance;Decreased mobility;Decreased cognition ? ?   ?  ?PT Treatment Interventions DME instruction;Gait training;Stair training;Functional mobility training;Therapeutic activities;Therapeutic exercise;Balance training;Patient/family education   ? ?PT Goals (Current goals can be found in the Care Plan section)  ?Acute Rehab PT Goals ?Patient Stated Goal: return home ?PT Goal Formulation: With patient ?Time For Goal Achievement: 11/09/21 ?Potential to Achieve Goals: Good ? ?  ?Frequency Min 2X/week ?  ? ? ?   ?AM-PAC PT "6 Clicks" Mobility  ?Outcome Measure Help needed turning from your back to your side while in a flat bed without using bedrails?: None ?Help needed moving from lying on your back to sitting on the side of a flat bed without using bedrails?: None ?Help needed moving to and from a bed to a chair (including a wheelchair)?: None ?Help needed standing up from a chair using your arms (e.g., wheelchair or bedside chair)?: None ?Help needed  to walk in hospital room?: A Little ?Help needed climbing 3-5 steps with a railing? : A Little ?6 Click Score: 22 ? ?  ?End of Session Equipment Utilized During Treatment: Gait belt ?Activity Tolerance: Patient tolerated treatment well ?Patient left: in chair;with call bell/phone within reach;with chair alarm set ?Nurse Communication: Mobility status ?PT Visit Diagnosis: Other abnormalities of gait and mobility (R26.89) ?  ? ?Time: 6004-5997 ?PT Time Calculation (min) (ACUTE ONLY): 36 min ? ? ?Charges:   PT Evaluation ?$PT Eval Low Complexity: 1 Low ?PT Treatments ?$Therapeutic Exercise: 8-22 mins ?  ?   ? ? ?West Carbo, PT, DPT  ? ?Acute Rehabilitation Department ?Pager #: [phone] - [phone] ? ?Sandra Cockayne ?10/26/2021, 11:29 AM ? ?

## 2021-10-26 NOTE — Progress Notes (Signed)
Pt told this RN that she is worried about her memory. She stated that she has been driving and completely forgot where she lived. She stated that she had to type her address off of her license into her phone to get back home. She also stated that she has been at church and that she "acted out, being loud" and that she did not recall doing or saying any of it. Pt kept telling this RN that she is scared and she does not know what to do. This RN told pt that she was glad that she said something so the doctor can be aware. MD had also put in for PRN anxiety medication to help calm pt, but pt got very offended and upset when RN asked if she wanted something for anxiety stating, "why does everyone think I have anxiety?" Pt refused medication. Pt also stated that she keeps feeling a tingling feeling in both of her legs and that was the reason she came into the hospital in the first place, per patient. Pt also stated that she feels like she will die soon. This RN tried to comfort pt and reassure her that she is in the right place and to please call if she has any pain or discomfort. Pt thanked this RN for listening to her and stated she would call if she needed anything. Pt is resting at this time, RN will continue to monitor.  ? ?Foster Simpson Halle Davlin  ?

## 2021-10-26 NOTE — Evaluation (Signed)
Occupational Therapy Evaluation ?Patient Details ?Name: Teresa Poole ?MRN: [id number] ?DOB: [birth year] ?Today's Date: 10/26/2021 ? ? ?History of Present Illness The pt is a 71 yo female presenting 3/4 with weakness and COVID +. Pt also found to have UTI upon work up. PMH includes: CKD IV, CHF, HTN, DM II, anemia, memory issues.  ? ?Clinical Impression ?  ?Pt admitted for concerns listed above. PTA pt reported that she was independent with all ADL's and IADL's, including driving and volunteering in the community. At this time, pt appears to be near her baseline, physically able to complete all BADL's and functional mobility independently. Throughout this hospital stay some behavioral/cognitive deficits have been noted, and pt would benefit from further higher cognitive level assessment. OT will follow acutely to address cognition functionally.   ?   ? ?Recommendations for follow up therapy are one component of a multi-disciplinary discharge planning process, led by the attending physician.  Recommendations may be updated based on patient status, additional functional criteria and insurance authorization.  ? ?Follow Up Recommendations ? No OT follow up  ?  ?Assistance Recommended at Discharge PRN  ?Patient can return home with the following Direct supervision/assist for medications management;Direct supervision/assist for financial management ? ?  ?Functional Status Assessment ? Patient has had a recent decline in their functional status and demonstrates the ability to make significant improvements in function in a reasonable and predictable amount of time.  ?Equipment Recommendations ? None recommended by OT  ?  ?Recommendations for Other Services   ? ? ?  ?Precautions / Restrictions Precautions ?Precautions: None ?Precaution Comments: covid on precautions ?Restrictions ?Weight Bearing Restrictions: No  ? ?  ? ?Mobility Bed Mobility ?Overal bed mobility: Independent ?  ?  ?  ?  ?  ?  ?General bed mobility comments:  no assist, line management only ?  ? ?Transfers ?Overall transfer level: Needs assistance ?Equipment used: None ?Transfers: Sit to/from Stand ?Sit to Stand: Supervision ?  ?  ?  ?  ?  ?General transfer comment: able to stand without assist or instability multiple times from low surfaces without UE support ?  ? ?  ?Balance Overall balance assessment: No apparent balance deficits (not formally assessed) ?  ?  ?  ?  ?  ?  ?  ?  ?  ?  ?  ?  ?  ?  ?  ?  ?  ?  ?   ? ?ADL either performed or assessed with clinical judgement  ? ?ADL Overall ADL's : At baseline;Modified independent ?  ?  ?  ?  ?  ?  ?  ?  ?  ?  ?  ?  ?  ?  ?  ?  ?  ?  ?  ?General ADL Comments: Overall, pt is able to safely complete ADL tasks with no assist  ? ? ? ?Vision Baseline Vision/History: 0 No visual deficits ?Ability to See in Adequate Light: 0 Adequate ?Patient Visual Report: No change from baseline ?Vision Assessment?: No apparent visual deficits  ?   ?Perception   ?  ?Praxis   ?  ? ?Pertinent Vitals/Pain Pain Assessment ?Pain Assessment: No/denies pain  ? ? ? ?Hand Dominance Right ?  ?Extremity/Trunk Assessment Upper Extremity Assessment ?Upper Extremity Assessment: Overall WFL for tasks assessed ?  ?Lower Extremity Assessment ?Lower Extremity Assessment: Defer to PT evaluation ?  ?Cervical / Trunk Assessment ?Cervical / Trunk Assessment: Normal ?  ?Communication Communication ?Communication: No difficulties ?  ?Cognition  Arousal/Alertness: Awake/alert ?Behavior During Therapy: [facility] for tasks assessed/performed ?Overall Cognitive Status: Within Functional Limits for tasks assessed ?  ?  ?  ?  ?  ?  ?  ?  ?  ?  ?  ?  ?  ?  ?  ?  ?General Comments: noted fluctuation in mental status overnight, pt with no hallucinations at this time, able to demo good orientation, answers all questions appropriately, and follow all instructions and cues. With ambulation pt does forget about IV pole/IV line and often gets tangled or pulls the IV line. ?  ?  ?General  Comments  VSS on RA ? ?  ?Exercises   ?  ?Shoulder Instructions    ? ? ?Home Living Family/patient expects to be discharged to:: Private residence ?Living Arrangements: Alone ?Available Help at Discharge: Family;Available PRN/intermittently ?Type of Home: Apartment ?Home Access: Stairs to enter ?Entrance Stairs-Number of Steps: 15 ?Entrance Stairs-Rails: Right ?Home Layout: One level ?  ?  ?Bathroom Shower/Tub: Tub/shower unit ?  ?Bathroom Toilet: Standard ?  ?  ?Home Equipment: Kasandra Knudsen - single point ?  ?  ?  ? ?  ?Prior Functioning/Environment Prior Level of Function : Independent/Modified Independent;Driving ?  ?  ?  ?  ?  ?  ?Mobility Comments: pt reports driving ofte, no recent falls, uses cane for walking in the park ?ADLs Comments: pt reports full independence ?  ? ?  ?  ?OT Problem List: Decreased strength;Decreased activity tolerance;Decreased cognition;Decreased safety awareness ?  ?   ?OT Treatment/Interventions: Self-care/ADL training;Therapeutic exercise;Energy conservation;DME and/or AE instruction;Therapeutic activities;Cognitive remediation/compensation;Patient/family education;Balance training  ?  ?OT Goals(Current goals can be found in the care plan section) Acute Rehab OT Goals ?Patient Stated Goal: To go home ?OT Goal Formulation: With patient ?Time For Goal Achievement: 11/09/21 ?Potential to Achieve Goals: Good ?ADL Goals ?Additional ADL Goal #1: Pt will complete medication management task with no errors, independently. ?Additional ADL Goal #2: Pt will complete multistep pathfinding task, independently.  ?OT Frequency: Min 2X/week ?  ? ?Co-evaluation   ?  ?  ?  ?  ? ?  ?AM-PAC OT "6 Clicks" Daily Activity     ?Outcome Measure Help from another person eating meals?: None ?Help from another person taking care of personal grooming?: None ?Help from another person toileting, which includes using toliet, bedpan, or urinal?: None ?Help from another person bathing (including washing, rinsing, drying)?:  None ?Help from another person to put on and taking off regular upper body clothing?: None ?Help from another person to put on and taking off regular lower body clothing?: None ?6 Click Score: 24 ?  ?End of Session Nurse Communication: Mobility status ? ?Activity Tolerance: Patient tolerated treatment well ?Patient left: in chair;with call bell/phone within reach;with chair alarm set ? ?OT Visit Diagnosis: Unsteadiness on feet (R26.81);Other abnormalities of gait and mobility (R26.89);Muscle weakness (generalized) (M62.81)  ?              ?Time: 7341-9379 ?OT Time Calculation (min): 25 min ?Charges:  OT General Charges ?$OT Visit: 1 Visit ?OT Evaluation ?$OT Eval Moderate Complexity: 1 Mod ? ?Aizen Duval H., OTR/L ?Acute Rehabilitation ? ?Myia Bergh Elane Yolanda Bonine ?10/26/2021, 12:41 PM ?

## 2021-10-26 NOTE — Progress Notes (Addendum)
This RN just helped pt to the bathroom and she stated that she was seeing Christmas stuff in her room. Pt stated she knew it was not really there but she was still seeing it and she was "thankful that it was something happy that she was seeing." RN will continue to monitor pt.  ? ?Teresa Poole ? ?

## 2021-10-26 NOTE — Progress Notes (Signed)
PROGRESS NOTE    Teresa Poole  FMB:[id number] DOB: 12-28-1950 DOA: 10/25/2021 PCP: Teresa Poole., MD   Brief Narrative:  HPI: Teresa Poole is a 71 y.o. female with medical history significant of CKD IV, NICM with EF of 55-60% and grade 1 diastolic dysfunction, HTN, T2DM, anemia, memory issues who presented to ED with complaints of dizziness and weakness.  She states today when she tried to stand up she got dizzy and felt like she was going to pass out. She also has lower back pain that feels like "muscle fatigue" like she ran 10 miles and it radiates down into her legs. She only has this when she stands up. She also has a  tingling in her feet. This all started today.     Recently admitted on 10/14/21 for acute on chronic renal disease with creatinine of 3.7 at time of admit and concerns for UTI; however, she left AMA early on 10/15/21.    She denies any fever/chills, very slight h/a, no chest pain or palpitations but she does have chest pressure, no shortness of breath or coughing, no stomach pain, no N/V/D, she does have some dysuria and some burning without urination, no urgency or frequency. She does have some incontinence that is new, no leg swelling. Denies any AH/VH/SI/HI.    She tested positive for covid on Thursday and was started on paxlovid Thursday and Friday, didn't take today. Has had covid vaccine x 2 with pfizer and one booster    ER Course:  vitals: afebrile, bp: 94/56, HR: 60, RR: 18, oxygen 99%RA Pertinent labs: hgb: 9.5 (9-10), BUN: 45, creatinine: 2.78 (2.1), lactic acid: 2.2>2.9, COVID: positive,  CXR: no acute finding   Assessment & Plan:   Principal Problem:   UTI (urinary tract infection) Active Problems:   COVID-19 virus infection   Acute renal failure superimposed on stage 4 chronic kidney disease (HCC)   Uncontrolled hypertension   Chronic combined systolic and diastolic heart failure (HCC)   Type 2 diabetes mellitus with chronic kidney disease,  without long-term current use of insulin (HCC)   Mixed hyperlipidemia   Anemia  UTI (urinary tract infection): I am not 100% impressed that she has UTI.  She is not coming up with any symptoms however we do not have any other etiology for her symptoms, we will continue Rocephin for total 3 days, follow culture.   COVID-19 virus infection Asymptomatic. Tested positive on thursday after going to a facility for dizziness and started on paxlovid. Has had 2 days of medication. With GFR <30, not recommended.  Chest x-ray unremarkable.  She is on 2 L of oxygen but she is saturating 100% so there is no indication for that.  I have advised the RN to take oxygen off.  I will treat her with molnupiravir for 5 days.  Lactic acidosis: Could be due to dehydration which has now resolved.   Acute renal failure superimposed on stage 4 chronic kidney disease ([place]): Baseline creatinine appears to be around 2.2.  Recently her creatinine was 3.71 when she was hospitalized and left AGAINST MEDICAL ADVICE.  Currently 3.21.  Likely prerenal also influenced by hypotension.  Avoid nephrotoxic agents.  I will start her on gentle hydration.   Uncontrolled hypertension: Blood pressure still on the low side.  Continue to hold antihypertensives.   Chronic diastolic CHF.  Please note that H&P has documented chronic combined systolic and diastolic congestive heart failure however patient's most recent echo done in October 2020  showed normal ejection fraction with grade 1 diastolic dysfunction.  She appears to be euvolemic.  We will gently hydrate her.  She has no edema.   Type 2 diabetes mellitus with chronic kidney disease, without long-term current use of insulin ([place]) Very well controlled. Recent a1c of 5.7 in 09/2021 Very sensitive SSI and accuchecks per protocol    Mixed hyperlipidemia Continue zetia    Anemia Baseline 9-10, stable Continue to monitor   Generalized weakness: Consulted PT OT.   Reviewed nurse's note  overnight.  Patient appears to have anxiety and some sort of psychiatric disorder as well.  She is nonsuicidal.  She will benefit from outpatient psychiatry visit.  DVT prophylaxis: enoxaparin (LOVENOX) injection 30 mg Start: 10/25/21 2000 Place TED hose Start: 10/25/21 1910   Code Status: Full Code  Family Communication:  None present at bedside.  Plan of care discussed with patient in length and he/she verbalized understanding and agreed with it.  Status is: Inpatient Remains inpatient appropriate because: Needs her renal function to get better first           Estimated body mass index is 24.83 kg/m as calculated from the following:   Height as of this encounter: '5\' 7"'$  (1.702 m).   Weight as of this encounter: 71.9 kg.    Nutritional Assessment: Body mass index is 24.83 kg/m.Marland Kitchen Seen by dietician.  I agree with the assessment and plan as outlined below: Nutrition Status:        . Skin Assessment: I have examined the patient's skin and I agree with the wound assessment as performed by the wound care RN as outlined below:    Consultants:  None  Procedures:  None  Antimicrobials:  Anti-infectives (From admission, onward)    Start     Dose/Rate Route Frequency Ordered Stop   10/26/21 1300  cefTRIAXone (ROCEPHIN) 1 g in sodium chloride 0.9 % 100 mL IVPB        1 g 200 mL/hr over 30 Minutes Intravenous Every 24 hours 10/25/21 1740 10/31/21 1259   10/26/21 1000  molnupiravir EUA (LAGEVRIO) capsule 800 mg        4 capsule Oral 2 times daily 10/26/21 0813 10/31/21 0959   10/25/21 1330  cefTRIAXone (ROCEPHIN) 1 g in sodium chloride 0.9 % 100 mL IVPB        1 g 200 mL/hr over 30 Minutes Intravenous  Once 10/25/21 1325 10/25/21 1437         Subjective: Seen and examined.  She complains of back pain.  No other complaint.  Objective: Vitals:   10/25/21 2231 10/25/21 2248 10/26/21 0555 10/26/21 0804  BP: (!) 104/57 (!) 97/55 119/75 (!) 105/56  Pulse: 68 67 65 71   Resp: (!) '24 17 18 18  '$ Temp: 98.6 F (37 C)  98.4 F (36.9 C) 98.5 F (36.9 C)  TempSrc: Oral   Oral  SpO2: 99% 99% 100% 100%  Weight:      Height:        Intake/Output Summary (Last 24 hours) at 10/26/2021 1001 Last data filed at 10/26/2021 0555 Gross per 24 hour  Intake 1862.98 ml  Output 0 ml  Net 1862.98 ml   Filed Weights   10/25/21 0920 10/25/21 1725 10/25/21 1940  Weight: 70.3 kg 73.8 kg 71.9 kg    Examination:  General exam: Appears calm and comfortable  Respiratory system: Clear to auscultation. Respiratory effort normal. Cardiovascular system: S1 & S2 heard, RRR. No JVD, murmurs, rubs, gallops or  clicks. No pedal edema. Gastrointestinal system: Abdomen is nondistended, soft and nontender. No organomegaly or masses felt. Normal bowel sounds heard. Central nervous system: Alert and oriented. No focal neurological deficits. Extremities: Symmetric 5 x 5 power. Skin: No rashes, lesions or ulcers Psychiatry: Judgement and insight appear poor   Data Reviewed: I have personally reviewed following labs and imaging studies  CBC: Recent Labs  Lab 10/25/21 0946 10/26/21 0228  WBC 6.3 5.6  HGB 9.5* 8.2*  HCT 28.4* 24.6*  MCV 87.1 86.6  PLT 230 287   Basic Metabolic Panel: Recent Labs  Lab 10/25/21 0946 10/26/21 0228  NA 134* 137  K 4.5 4.0  CL 98 102  CO2 22 23  GLUCOSE 283* 174*  BUN 45* 52*  CREATININE 2.78* 3.21*  CALCIUM 8.4* 7.8*   GFR: Estimated Creatinine Clearance: 16.1 mL/min (A) (by C-G formula based on SCr of 3.21 mg/dL (H)). Liver Function Tests: Recent Labs  Lab 10/25/21 0946  AST 26  ALT 16  ALKPHOS 48  BILITOT 0.5  PROT 6.9  ALBUMIN 3.2*   No results for input(s): LIPASE, AMYLASE in the last 168 hours. No results for input(s): AMMONIA in the last 168 hours. Coagulation Profile: No results for input(s): INR, PROTIME in the last 168 hours. Cardiac Enzymes: No results for input(s): CKTOTAL, CKMB, CKMBINDEX, TROPONINI in the last  168 hours. BNP (last 3 results) No results for input(s): PROBNP in the last 8760 hours. HbA1C: No results for input(s): HGBA1C in the last 72 hours. CBG: Recent Labs  Lab 10/25/21 1726 10/25/21 1750 10/25/21 2017 10/26/21 0803  GLUCAP 186* 208* 185* 147*   Lipid Profile: No results for input(s): CHOL, HDL, LDLCALC, TRIG, CHOLHDL, LDLDIRECT in the last 72 hours. Thyroid Function Tests: No results for input(s): TSH, T4TOTAL, FREET4, T3FREE, THYROIDAB in the last 72 hours. Anemia Panel: Recent Labs    10/25/21 1827  FERRITIN 74   Sepsis Labs: Recent Labs  Lab 10/25/21 1211 10/25/21 1827 10/25/21 2122 10/26/21 0228  PROCALCITON  --  0.30  --   --   LATICACIDVEN 2.9* 1.7 3.1* 1.5    Recent Results (from the past 240 hour(s))  Resp Panel by RT-PCR (Flu A&B, Covid) Nasopharyngeal Swab     Status: None   Collection Time: 10/17/21 12:40 AM   Specimen: Nasopharyngeal Swab; Nasopharyngeal(NP) swabs in vial transport medium  Result Value Ref Range Status   SARS Coronavirus 2 by RT PCR NEGATIVE NEGATIVE Final    Comment: (NOTE) SARS-CoV-2 target nucleic acids are NOT DETECTED.  The SARS-CoV-2 RNA is generally detectable in upper respiratory specimens during the acute phase of infection. The lowest concentration of SARS-CoV-2 viral copies this assay can detect is 138 copies/mL. A negative result does not preclude SARS-Cov-2 infection and should not be used as the sole basis for treatment or other patient management decisions. A negative result may occur with  improper specimen collection/handling, submission of specimen other than nasopharyngeal swab, presence of viral mutation(s) within the areas targeted by this assay, and inadequate number of viral copies(<138 copies/mL). A negative result must be combined with clinical observations, patient history, and epidemiological information. The expected result is Negative.  Fact Sheet for Patients:   EntrepreneurPulse.com.au  Fact Sheet for Healthcare Providers:  IncredibleEmployment.be  This test is no t yet approved or cleared by the [place] FDA and  has been authorized for detection and/or diagnosis of SARS-CoV-2 by FDA under an Emergency Use Authorization (EUA). This EUA will remain  in effect (  meaning this test can be used) for the duration of the COVID-19 declaration under Section 564(b)(1) of the Act, 21 U.S.C.section 360bbb-3(b)(1), unless the authorization is terminated  or revoked sooner.       Influenza A by PCR NEGATIVE NEGATIVE Final   Influenza B by PCR NEGATIVE NEGATIVE Final    Comment: (NOTE) The Xpert Xpress SARS-CoV-2/FLU/RSV plus assay is intended as an aid in the diagnosis of influenza from Nasopharyngeal swab specimens and should not be used as a sole basis for treatment. Nasal washings and aspirates are unacceptable for Xpert Xpress SARS-CoV-2/FLU/RSV testing.  Fact Sheet for Patients: EntrepreneurPulse.com.au  Fact Sheet for Healthcare Providers: IncredibleEmployment.be  This test is not yet approved or cleared by the [place] FDA and has been authorized for detection and/or diagnosis of SARS-CoV-2 by FDA under an Emergency Use Authorization (EUA). This EUA will remain in effect (meaning this test can be used) for the duration of the COVID-19 declaration under Section 564(b)(1) of the Act, 21 U.S.C. section 360bbb-3(b)(1), unless the authorization is terminated or revoked.  Performed at [facility], [place] [address] [place], [place] [address]   Urine Culture     Status: Abnormal   Collection Time: 10/17/21 12:59 AM   Specimen: Urine, Clean Catch  Result Value Ref Range Status   Specimen Description URINE, CLEAN CATCH  Final   Special Requests   Final    NONE Performed at [facility], [place] [address] [place], [place] [address]    Culture MULTIPLE  SPECIES PRESENT, SUGGEST RECOLLECTION (A)  Final   Report Status 10/18/2021 FINAL  Final  Resp Panel by RT-PCR (Flu A&B, Covid) Nasopharyngeal Swab     Status: Abnormal   Collection Time: 10/25/21  9:46 AM   Specimen: Nasopharyngeal Swab; Nasopharyngeal(NP) swabs in vial transport medium  Result Value Ref Range Status   SARS Coronavirus 2 by RT PCR POSITIVE (A) NEGATIVE Final    Comment: (NOTE) SARS-CoV-2 target nucleic acids are DETECTED.  The SARS-CoV-2 RNA is generally detectable in upper respiratory specimens during the acute phase of infection. Positive results are indicative of the presence of the identified virus, but do not rule out bacterial infection or co-infection with other pathogens not detected by the test. Clinical correlation with patient history and other diagnostic information is necessary to determine patient infection status. The expected result is Negative.  Fact Sheet for Patients: EntrepreneurPulse.com.au  Fact Sheet for Healthcare Providers: IncredibleEmployment.be  This test is not yet approved or cleared by the [place] FDA and  has been authorized for detection and/or diagnosis of SARS-CoV-2 by FDA under an Emergency Use Authorization (EUA).  This EUA will remain in effect (meaning this test can be used) for the duration of  the COVID-19 declaration under Section 564(b)(1) of the A ct, 21 U.S.C. section 360bbb-3(b)(1), unless the authorization is terminated or revoked sooner.     Influenza A by PCR NEGATIVE NEGATIVE Final   Influenza B by PCR NEGATIVE NEGATIVE Final    Comment: (NOTE) The Xpert Xpress SARS-CoV-2/FLU/RSV plus assay is intended as an aid in the diagnosis of influenza from Nasopharyngeal swab specimens and should not be used as a sole basis for treatment. Nasal washings and aspirates are unacceptable for Xpert Xpress SARS-CoV-2/FLU/RSV testing.  Fact Sheet for  Patients: EntrepreneurPulse.com.au  Fact Sheet for Healthcare Providers: IncredibleEmployment.be  This test is not yet approved or cleared by the [place] FDA and has been authorized for detection and/or diagnosis of SARS-CoV-2 by  FDA under an Emergency Use Authorization (EUA). This EUA will remain in effect (meaning this test can be used) for the duration of the COVID-19 declaration under Section 564(b)(1) of the Act, 21 U.S.C. section 360bbb-3(b)(1), unless the authorization is terminated or revoked.  Performed at [facility], [address], [place], [place] [address]      Radiology Studies: DG Chest 2 View  Result Date: 10/25/2021 CLINICAL DATA:  71 year old female with history of pneumonia. Diagnosed with COVID yesterday. EXAM: CHEST - 2 VIEW COMPARISON:  Chest x-ray 09/09/2021. FINDINGS: Lung volumes are normal. No consolidative airspace disease. No pleural effusions. No pneumothorax. No pulmonary nodule or mass noted. Pulmonary vasculature and the cardiomediastinal silhouette are within normal limits. Atherosclerotic calcifications are noted in the thoracic aorta. IMPRESSION: 1.  No radiographic evidence of acute cardiopulmonary disease. 2. Aortic atherosclerosis. Electronically Signed   By: Vinnie Langton M.D.   On: 10/25/2021 10:17   US RENAL  Result Date: 10/25/2021 CLINICAL DATA:  Acute kidney injury superimposed on chronic kidney disease. EXAM: RENAL / URINARY TRACT ULTRASOUND COMPLETE COMPARISON:  CT abdomen and pelvis 09/22/2021. FINDINGS: Right Kidney: Renal measurements: 7.2 x 4.1 x 3.7 cm = volume: 56 mL. Echogenicity is increased. No hydronephrosis. There are 2 cysts in the right kidney measuring up to 1.5 cm. Left Kidney: Renal measurements: 7.6 x 3.6 x 3.4 cm = volume: 49 mL. Echogenicity is increased. No mass or hydronephrosis visualized. Bladder: Not visualized. Other: None. IMPRESSION: 1. Bilateral echogenic kidneys  likely related to medical renal disease. Bilateral renal atrophy. 2. No hydronephrosis. Electronically Signed   By: Ronney Asters M.D.   On: 10/25/2021 22:22    Scheduled Meds:  cycloSPORINE  1 drop Both Eyes BID   enoxaparin (LOVENOX) injection  30 mg Subcutaneous Q24H   ezetimibe  10 mg Oral Daily   famotidine  40 mg Oral Daily   insulin aspart  0-6 Units Subcutaneous TID WC   molnupiravir EUA  4 capsule Oral BID   nitroGLYCERIN       OLANZapine zydis  5 mg Oral Daily   pantoprazole  80 mg Oral Daily   rosuvastatin  20 mg Oral Daily   Continuous Infusions:  cefTRIAXone (ROCEPHIN)  IV       LOS: 1 day   Time spent: 33 minutes  Darliss Cheney, MD [facility]  10/26/2021, 10:01 AM  Please page via [facility] and do not message via secure chat for urgent patient care matters. Secure chat can be used for non urgent patient care matters.  How to contact the [facility] Attending or Consulting provider [place] or covering provider during after hours [place], for this patient?  Check the care team in [facility] and look for a) attending/consulting [facility] provider listed and b) the [facility] team listed. Page or secure chat [phone]. Log into [email or web address] and use [place] universal password to access. If you do not have the password, please contact the hospital operator. Locate the [facility] provider you are looking for under [facility] and page to a number that you can be directly reached. If you still have difficulty reaching the provider, please page the [facility] (Director on Call) for the Hospitalists listed on amion for assistance.

## 2021-10-27 DIAGNOSIS — N3000 Acute cystitis without hematuria: Secondary | ICD-10-CM | POA: Diagnosis not present

## 2021-10-27 LAB — BASIC METABOLIC PANEL
Anion gap: 11 (ref 5–15)
BUN: 51 mg/dL — ABNORMAL HIGH (ref 8–23)
CO2: 19 mmol/L — ABNORMAL LOW (ref 22–32)
Calcium: 7.8 mg/dL — ABNORMAL LOW (ref 8.9–10.3)
Chloride: 111 mmol/L (ref 98–111)
Creatinine, Ser: 3.09 mg/dL — ABNORMAL HIGH (ref 0.44–1.00)
GFR, Estimated: 16 mL/min — ABNORMAL LOW (ref >60)
Glucose, Bld: 103 mg/dL — ABNORMAL HIGH (ref 70–99)
Potassium: 3.8 mmol/L (ref 3.5–5.1)
Sodium: 141 mmol/L (ref 135–145)

## 2021-10-27 LAB — CBC WITH DIFFERENTIAL/PLATELET
Abs Immature Granulocytes: 0.01 10*3/uL (ref 0.00–0.07)
Basophils Absolute: 0 10*3/uL (ref 0.0–0.1)
Basophils Relative: 0 %
Eosinophils Absolute: 0 10*3/uL (ref 0.0–0.5)
Eosinophils Relative: 0 %
HCT: 27.4 % — ABNORMAL LOW (ref 36.0–46.0)
Hemoglobin: 9.1 g/dL — ABNORMAL LOW (ref 12.0–15.0)
Immature Granulocytes: 0 %
Lymphocytes Relative: 40 %
Lymphs Abs: 2.4 10*3/uL (ref 0.7–4.0)
MCH: 29.3 pg (ref 26.0–34.0)
MCHC: 33.2 g/dL (ref 30.0–36.0)
MCV: 88.1 fL (ref 80.0–100.0)
Monocytes Absolute: 0.3 10*3/uL (ref 0.1–1.0)
Monocytes Relative: 6 %
Neutro Abs: 3.2 10*3/uL (ref 1.7–7.7)
Neutrophils Relative %: 54 %
Platelets: 205 10*3/uL (ref 150–400)
RBC: 3.11 MIL/uL — ABNORMAL LOW (ref 3.87–5.11)
RDW: 13.2 % (ref 11.5–15.5)
WBC: 6 10*3/uL (ref 4.0–10.5)
nRBC: 0 % (ref 0.0–0.2)

## 2021-10-27 LAB — GLUCOSE, CAPILLARY
Glucose-Capillary: 88 mg/dL (ref 70–99)
Glucose-Capillary: 113 mg/dL — ABNORMAL HIGH (ref 70–99)
Glucose-Capillary: 96 mg/dL (ref 70–99)
Glucose-Capillary: 107 mg/dL — ABNORMAL HIGH (ref 70–99)

## 2021-10-27 LAB — URINE CULTURE

## 2021-10-27 NOTE — Progress Notes (Signed)
Occupational Therapy Treatment ?Patient Details ?Name: Teresa Poole ?MRN: [id number] ?DOB: 01-14-1951 ?Today's Date: 10/27/2021 ? ? ?History of present illness The pt is a 71 yo female presenting 3/4 with weakness and COVID +. Pt also found to have UTI upon work up. PMH includes: CKD IV, CHF, HTN, DM II, anemia, memory issues. ?  ?OT comments ? Patient in recliner and required verbal cues to lower foot rest. Patient instructed in multiple step tasks for item retrieval and IADL tasks with occasional cue.  Patient was able to demonstrate good recall of instructions and demonstrated good safety. Patient would benefit from further OT services to further address med management.   ? ?Recommendations for follow up therapy are one component of a multi-disciplinary discharge planning process, led by the attending physician.  Recommendations may be updated based on patient status, additional functional criteria and insurance authorization. ?   ?Follow Up Recommendations ? No OT follow up  ?  ?Assistance Recommended at Discharge PRN  ?Patient can return home with the following ? Direct supervision/assist for medications management;Direct supervision/assist for financial management ?  ?Equipment Recommendations ? None recommended by OT  ?  ?Recommendations for Other Services   ? ?  ?Precautions / Restrictions Precautions ?Precautions: None ?Precaution Comments: covid on precautions ?Restrictions ?Weight Bearing Restrictions: No  ? ? ?  ? ?Mobility Bed Mobility ?Overal bed mobility: Independent ?  ?  ?  ?  ?  ?  ?General bed mobility comments: seated in recliner ?  ? ?Transfers ?Overall transfer level: Needs assistance ?Equipment used: None ?Transfers: Sit to/from Stand ?Sit to Stand: Supervision ?  ?  ?  ?  ?  ?General transfer comment: able to perform mobility and transfers and manage IV pole without assistance ?  ?  ?Balance Overall balance assessment: No apparent balance deficits (not formally assessed) ?  ?  ?  ?  ?  ?  ?   ?  ?  ?  ?  ?  ?  ?  ?  ?  ?  ?  ?   ? ?ADL either performed or assessed with clinical judgement  ? ?ADL Overall ADL's : At baseline;Modified independent ?  ?  ?  ?  ?  ?  ?  ?  ?  ?  ?  ?  ?  ?  ?  ?  ?  ?  ?  ?  ?  ? ?Extremity/Trunk Assessment   ?  ?  ?  ?  ?  ? ?Vision   ?  ?  ?Perception   ?  ?Praxis   ?  ? ?Cognition Arousal/Alertness: Awake/alert ?Behavior During Therapy: [facility] for tasks assessed/performed ?Overall Cognitive Status: Within Functional Limits for tasks assessed ?  ?  ?  ?  ?  ?  ?  ?  ?  ?  ?  ?  ?  ?  ?  ?  ?General Comments: demonstrated good problem solving and followed commands sell ?  ?  ?   ?Exercises   ? ?  ?Shoulder Instructions   ? ? ?  ?General Comments    ? ? ?Pertinent Vitals/ Pain       Pain Assessment ?Pain Assessment: No/denies pain ? ?Home Living   ?  ?  ?  ?  ?  ?  ?  ?  ?  ?  ?  ?  ?  ?  ?  ?  ?  ?  ? ?  ?Prior Functioning/Environment    ?  ?  ?  ?   ? ?  Frequency ? Min 2X/week  ? ? ? ? ?  ?Progress Toward Goals ? ?OT Goals(current goals can now be found in the care plan section) ? Progress towards OT goals: Progressing toward goals ? ?Acute Rehab OT Goals ?Patient Stated Goal: get better ?OT Goal Formulation: With patient ?Time For Goal Achievement: 11/09/21 ?Potential to Achieve Goals: Good ?ADL Goals ?Additional ADL Goal #1: Pt will complete medication management task with no errors, independently. ?Additional ADL Goal #2: Pt will complete multistep pathfinding task, independently.  ?Plan Discharge plan remains appropriate   ? ?Co-evaluation ? ? ?   ?  ?  ?  ?  ? ?  ?AM-PAC OT "6 Clicks" Daily Activity     ?Outcome Measure ? ? Help from another person eating meals?: None ?Help from another person taking care of personal grooming?: None ?Help from another person toileting, which includes using toliet, bedpan, or urinal?: None ?Help from another person bathing (including washing, rinsing, drying)?: None ?Help from another person to put on and taking off regular upper body  clothing?: None ?Help from another person to put on and taking off regular lower body clothing?: None ?6 Click Score: 24 ? ?  ?End of Session   ? ?OT Visit Diagnosis: Unsteadiness on feet (R26.81);Other abnormalities of gait and mobility (R26.89);Muscle weakness (generalized) (M62.81) ?  ?Activity Tolerance Patient tolerated treatment well ?  ?Patient Left in chair;with call bell/phone within reach;with chair alarm set ?  ?Nurse Communication Mobility status ?  ? ?   ? ?Time: 3662-9476 ?OT Time Calculation (min): 22 min ? ?Charges: OT General Charges ?$OT Visit: 1 Visit ?OT Treatments ?$Therapeutic Activity: 8-22 mins ? ?Lodema Hong, OTA ?Acute Rehabilitation Services  ?Pager [phone] ?Office [phone] ? ? ?Trixie Dredge ?10/27/2021, 3:28 PM ?

## 2021-10-27 NOTE — Progress Notes (Signed)
PROGRESS NOTE    Teresa Poole  OEU:[id number] DOB: 02/21/1951 DOA: 10/25/2021 PCP: [name], MD   Brief Narrative:  HPI: Teresa Poole is a 71 y.o. female with medical history significant of CKD IV, NICM with EF of 55-60% and grade 1 diastolic dysfunction, HTN, T2DM, anemia, memory issues who presented to ED with complaints of dizziness and weakness.  She states today when she tried to stand up she got dizzy and felt like she was going to pass out. She also has lower back pain that feels like "muscle fatigue" like she ran 10 miles and it radiates down into her legs. She only has this when she stands up. She also has a  tingling in her feet. This all started today.     Recently admitted on 10/14/21 for acute on chronic renal disease with creatinine of 3.7 at time of admit and concerns for UTI; however, she left AMA early on 10/15/21.    She denies any fever/chills, very slight h/a, no chest pain or palpitations but she does have chest pressure, no shortness of breath or coughing, no stomach pain, no N/V/D, she does have some dysuria and some burning without urination, no urgency or frequency. She does have some incontinence that is new, no leg swelling. Denies any AH/VH/SI/HI.    She tested positive for covid on Thursday and was started on paxlovid Thursday and Friday, didn't take today. Has had covid vaccine x 2 with pfizer and one booster    ER Course:  vitals: afebrile, bp: 94/56, HR: 60, RR: 18, oxygen 99%RA Pertinent labs: hgb: 9.5 (9-10), BUN: 45, creatinine: 2.78 (2.1), lactic acid: 2.2>2.9, COVID: positive,  CXR: no acute finding   Assessment & Plan:   Principal Problem:   UTI (urinary tract infection) Active Problems:   COVID-19 virus infection   Acute renal failure superimposed on stage 4 chronic kidney disease (HCC)   Uncontrolled hypertension   Chronic combined systolic and diastolic heart failure (HCC)   Type 2 diabetes mellitus with chronic kidney disease,  without long-term current use of insulin (HCC)   Mixed hyperlipidemia   Anemia  UTI (urinary tract infection): Urine culture growing multiple species.  Will complete 3 days of Rocephin.   COVID-19 virus infection Asymptomatic. Tested positive on thursday after going to a facility for dizziness and started on paxlovid. Has had 2 days of medication. With GFR <30, not recommended.  Chest x-ray unremarkable.  She is on 2 L of oxygen but she is saturating 100% so there is no indication for that.  I have advised the RN to take oxygen off.  I will treat her with molnupiravir for 5 days.  Lactic acidosis: Could be due to dehydration which has now resolved.   Acute renal failure superimposed on stage 4 chronic kidney disease ([place]): Baseline creatinine appears to be around 2.2.  Recently her creatinine was 3.71 when she was hospitalized and left AGAINST MEDICAL ADVICE.  Currently 3.09.  Likely prerenal also influenced by hypotension.  Avoid nephrotoxic agents.  Will increase frequency of normal saline from 50 to 100 cc/h.   Uncontrolled hypertension: Blood pressure.  Continue to hold antihypertensives.   Chronic diastolic CHF.  Please note that H&P has documented chronic combined systolic and diastolic congestive heart failure however patient's most recent echo done in October 2020 showed normal ejection fraction with grade 1 diastolic dysfunction.  She appears to be euvolemic.  We will gently hydrate her.  She has no edema.  Type 2 diabetes mellitus with chronic kidney disease, without long-term current use of insulin ([place]) Very well controlled. Recent a1c of 5.7 in 09/2021 Very sensitive SSI and accuchecks per protocol    Mixed hyperlipidemia Continue zetia    Anemia Baseline 9-10, stable Continue to monitor   Generalized weakness: Resolved.  Seen by PT OT.  No need recommended.   Anxiety: Patient on Zyprexa.  Seen by psychiatry.  Patient seems to have some cognitive deficits.  Will benefit  from outpatient neurology follow-up.  DVT prophylaxis: enoxaparin (LOVENOX) injection 30 mg Start: 10/25/21 2000 Place TED hose Start: 10/25/21 1910   Code Status: Full Code  Family Communication:  None present at bedside.  Plan of care discussed with patient in length and he/she verbalized understanding and agreed with it.  Status is: Inpatient Remains inpatient appropriate because: Needs her renal function to get better first           Estimated body mass index is 24.83 kg/m as calculated from the following:   Height as of this encounter: '5\' 7"'$  (1.702 m).   Weight as of this encounter: 71.9 kg.    Nutritional Assessment: Body mass index is 24.83 kg/m.Marland Kitchen Seen by dietician.  I agree with the assessment and plan as outlined below: Nutrition Status:        . Skin Assessment: I have examined the patient's skin and I agree with the wound assessment as performed by the wound care RN as outlined below:    Consultants:  None  Procedures:  None  Antimicrobials:  Anti-infectives (From admission, onward)    Start     Dose/Rate Route Frequency Ordered Stop   10/26/21 1300  cefTRIAXone (ROCEPHIN) 1 g in sodium chloride 0.9 % 100 mL IVPB        1 g 200 mL/hr over 30 Minutes Intravenous Every 24 hours 10/25/21 1740 10/31/21 1259   10/26/21 1000  molnupiravir EUA (LAGEVRIO) capsule 800 mg        4 capsule Oral 2 times daily 10/26/21 0813 10/31/21 0959   10/25/21 1330  cefTRIAXone (ROCEPHIN) 1 g in sodium chloride 0.9 % 100 mL IVPB        1 g 200 mL/hr over 30 Minutes Intravenous  Once 10/25/21 1325 10/25/21 1437         Subjective: In and examined.  No complaints.  Objective: Vitals:   10/26/21 1657 10/26/21 2111 10/27/21 0427 10/27/21 0731  BP: (!) 105/51 137/64 139/65 (!) 142/68  Pulse: 74 82 79 77  Resp: '18 18 15 17  '$ Temp: 98.2 F (36.8 C) 98.2 F (36.8 C) 98.1 F (36.7 C) 98.2 F (36.8 C)  TempSrc: Oral Oral Oral Oral  SpO2: 99% 100% 100% 100%   Weight:      Height:        Intake/Output Summary (Last 24 hours) at 10/27/2021 1358 Last data filed at 10/27/2021 1216 Gross per 24 hour  Intake 1472.72 ml  Output --  Net 1472.72 ml    Filed Weights   10/25/21 0920 10/25/21 1725 10/25/21 1940  Weight: 70.3 kg 73.8 kg 71.9 kg    Examination:  General exam: Appears calm and comfortable  Respiratory system: Clear to auscultation. Respiratory effort normal. Cardiovascular system: S1 & S2 heard, RRR. No JVD, murmurs, rubs, gallops or clicks. No pedal edema. Gastrointestinal system: Abdomen is nondistended, soft and nontender. No organomegaly or masses felt. Normal bowel sounds heard. Central nervous system: Alert and oriented. No focal neurological deficits. Extremities: Symmetric 5  x 5 power. Skin: No rashes, lesions or ulcers.  Psychiatry: Judgement and insight appear poor Data Reviewed: I have personally reviewed following labs and imaging studies  CBC: Recent Labs  Lab 10/25/21 0946 10/26/21 0228 10/27/21 0405  WBC 6.3 5.6 6.0  NEUTROABS  --   --  3.2  HGB 9.5* 8.2* 9.1*  HCT 28.4* 24.6* 27.4*  MCV 87.1 86.6 88.1  PLT 230 187 629    Basic Metabolic Panel: Recent Labs  Lab 10/25/21 0946 10/26/21 0228 10/27/21 0405  NA 134* 137 141  K 4.5 4.0 3.8  CL 98 102 111  CO2 22 23 19*  GLUCOSE 283* 174* 103*  BUN 45* 52* 51*  CREATININE 2.78* 3.21* 3.09*  CALCIUM 8.4* 7.8* 7.8*    GFR: Estimated Creatinine Clearance: 16.7 mL/min (A) (by C-G formula based on SCr of 3.09 mg/dL (H)). Liver Function Tests: Recent Labs  Lab 10/25/21 0946  AST 26  ALT 16  ALKPHOS 48  BILITOT 0.5  PROT 6.9  ALBUMIN 3.2*    No results for input(s): LIPASE, AMYLASE in the last 168 hours. No results for input(s): AMMONIA in the last 168 hours. Coagulation Profile: No results for input(s): INR, PROTIME in the last 168 hours. Cardiac Enzymes: No results for input(s): CKTOTAL, CKMB, CKMBINDEX, TROPONINI in the last 168 hours. BNP  (last 3 results) No results for input(s): PROBNP in the last 8760 hours. HbA1C: No results for input(s): HGBA1C in the last 72 hours. CBG: Recent Labs  Lab 10/26/21 0803 10/26/21 1143 10/26/21 1654 10/27/21 0728 10/27/21 1116  GLUCAP 147* 151* 88 88 113*    Lipid Profile: No results for input(s): CHOL, HDL, LDLCALC, TRIG, CHOLHDL, LDLDIRECT in the last 72 hours. Thyroid Function Tests: No results for input(s): TSH, T4TOTAL, FREET4, T3FREE, THYROIDAB in the last 72 hours. Anemia Panel: Recent Labs    10/25/21 1827  FERRITIN 74    Sepsis Labs: Recent Labs  Lab 10/25/21 1211 10/25/21 1827 10/25/21 2122 10/26/21 0228  PROCALCITON  --  0.30  --   --   LATICACIDVEN 2.9* 1.7 3.1* 1.5     Recent Results (from the past 240 hour(s))  Resp Panel by RT-PCR (Flu A&B, Covid) Nasopharyngeal Swab     Status: Abnormal   Collection Time: 10/25/21  9:46 AM   Specimen: Nasopharyngeal Swab; Nasopharyngeal(NP) swabs in vial transport medium  Result Value Ref Range Status   SARS Coronavirus 2 by RT PCR POSITIVE (A) NEGATIVE Final    Comment: (NOTE) SARS-CoV-2 target nucleic acids are DETECTED.  The SARS-CoV-2 RNA is generally detectable in upper respiratory specimens during the acute phase of infection. Positive results are indicative of the presence of the identified virus, but do not rule out bacterial infection or co-infection with other pathogens not detected by the test. Clinical correlation with patient history and other diagnostic information is necessary to determine patient infection status. The expected result is Negative.  Fact Sheet for Patients: EntrepreneurPulse.com.au  Fact Sheet for Healthcare Providers: IncredibleEmployment.be  This test is not yet approved or cleared by the [place] FDA and  has been authorized for detection and/or diagnosis of SARS-CoV-2 by FDA under an Emergency Use Authorization (EUA).  This EUA  will remain in effect (meaning this test can be used) for the duration of  the COVID-19 declaration under Section 564(b)(1) of the A ct, 21 U.S.C. section 360bbb-3(b)(1), unless the authorization is terminated or revoked sooner.     Influenza A by PCR NEGATIVE NEGATIVE Final  Influenza B by PCR NEGATIVE NEGATIVE Final    Comment: (NOTE) The Xpert Xpress SARS-CoV-2/FLU/RSV plus assay is intended as an aid in the diagnosis of influenza from Nasopharyngeal swab specimens and should not be used as a sole basis for treatment. Nasal washings and aspirates are unacceptable for Xpert Xpress SARS-CoV-2/FLU/RSV testing.  Fact Sheet for Patients: EntrepreneurPulse.com.au  Fact Sheet for Healthcare Providers: IncredibleEmployment.be  This test is not yet approved or cleared by the [place] FDA and has been authorized for detection and/or diagnosis of SARS-CoV-2 by FDA under an Emergency Use Authorization (EUA). This EUA will remain in effect (meaning this test can be used) for the duration of the COVID-19 declaration under Section 564(b)(1) of the Act, 21 U.S.C. section 360bbb-3(b)(1), unless the authorization is terminated or revoked.  Performed at [facility], [address] [place], [place] [address]   Urine Culture     Status: Abnormal   Collection Time: 10/25/21 12:50 PM   Specimen: Urine, Clean Catch  Result Value Ref Range Status   Specimen Description   Final    URINE, CLEAN CATCH Performed at [facility], [address] [place], [place] [address]    Special Requests   Final    NONE Performed at [facility], [address] [place], [place] [address]    Culture MULTIPLE SPECIES PRESENT, SUGGEST RECOLLECTION (A)  Final   Report Status 10/27/2021 FINAL  Final  Blood culture (routine x 2)     Status: None (Preliminary result)   Collection Time: 10/25/21  1:14 PM   Specimen: BLOOD LEFT WRIST   Result Value Ref Range Status   Specimen Description   Final    BLOOD LEFT WRIST Performed at [facility], [address] [place], [place] [address]    Special Requests   Final    BOTTLES DRAWN AEROBIC AND ANAEROBIC Blood Culture adequate volume Performed at [facility], [address] [place], [place] [address]    Culture   Final    NO GROWTH 2 DAYS Performed at [facility], [place] [address], [place], [place] [address]    Report Status PENDING  Incomplete  Blood culture (routine x 2)     Status: None (Preliminary result)   Collection Time: 10/25/21  1:22 PM   Specimen: BLOOD RIGHT HAND  Result Value Ref Range Status   Specimen Description   Final    BLOOD RIGHT HAND Performed at [facility], [address] [place], [place] [address]    Special Requests   Final    BOTTLES DRAWN AEROBIC AND ANAEROBIC Blood Culture adequate volume Performed at [facility], [address] [place], [place] [address]    Culture   Final    NO GROWTH 2 DAYS Performed at [facility], [place] [address], [place], [place] [address]    Report Status PENDING  Incomplete      Radiology Studies: US RENAL  Result Date: 10/25/2021 CLINICAL DATA:  Acute kidney injury superimposed on chronic kidney disease. EXAM: RENAL / URINARY TRACT ULTRASOUND COMPLETE COMPARISON:  CT abdomen and pelvis 09/22/2021. FINDINGS: Right Kidney: Renal measurements: 7.2 x 4.1 x 3.7 cm = volume: 56 mL. Echogenicity is increased. No hydronephrosis. There are 2 cysts in the right kidney measuring up to 1.5 cm. Left Kidney: Renal measurements: 7.6 x 3.6 x 3.4 cm = volume: 49 mL. Echogenicity is  increased. No mass or hydronephrosis visualized. Bladder: Not visualized. Other: None. IMPRESSION: 1. Bilateral echogenic kidneys likely related to medical renal disease. Bilateral renal atrophy. 2. No hydronephrosis. Electronically Signed   By: Ronney Asters M.D.   On: 10/25/2021 22:22     Scheduled Meds:  cycloSPORINE  1 drop Both Eyes BID   enoxaparin (LOVENOX) injection  30 mg Subcutaneous Q24H   ezetimibe  10 mg Oral Daily   famotidine  20 mg Oral Daily   insulin aspart  0-6 Units Subcutaneous TID WC   molnupiravir EUA  4 capsule Oral BID   OLANZapine zydis  5 mg Oral Daily   pantoprazole  80 mg Oral Daily   rosuvastatin  20 mg Oral Daily   Continuous Infusions:  sodium chloride 50 mL/hr at 10/27/21 1002   cefTRIAXone (ROCEPHIN)  IV 1 g (10/27/21 1355)     LOS: 2 days   Time spent: 25 minutes  Darliss Cheney, MD [facility]  10/27/2021, 1:58 PM  Please page via [facility] and do not message via secure chat for urgent patient care matters. Secure chat can be used for non urgent patient care matters.  How to contact the [facility] Attending or Consulting provider [place] or covering provider during after hours [place], for this patient?  Check the care team in [facility] and look for a) attending/consulting [facility] provider listed and b) the [facility] team listed. Page or secure chat [phone]. Log into [email or web address] and use [place]'s universal password to access. If you do not have the password, please contact the hospital operator. Locate the [facility] provider you are looking for under [facility] and page to a number that you can be directly reached. If you still have difficulty reaching the provider, please page the [facility] (Director on Call) for the Hospitalists listed on amion for assistance.

## 2021-10-28 ENCOUNTER — Other Ambulatory Visit (HOSPITAL_COMMUNITY): Payer: Self-pay

## 2021-10-28 DIAGNOSIS — F309 Manic episode, unspecified: Secondary | ICD-10-CM

## 2021-10-28 DIAGNOSIS — N3000 Acute cystitis without hematuria: Secondary | ICD-10-CM | POA: Diagnosis not present

## 2021-10-28 LAB — BASIC METABOLIC PANEL
Anion gap: 10 (ref 5–15)
BUN: 41 mg/dL — ABNORMAL HIGH (ref 8–23)
CO2: 20 mmol/L — ABNORMAL LOW (ref 22–32)
Calcium: 8.3 mg/dL — ABNORMAL LOW (ref 8.9–10.3)
Chloride: 114 mmol/L — ABNORMAL HIGH (ref 98–111)
Creatinine, Ser: 2.21 mg/dL — ABNORMAL HIGH (ref 0.44–1.00)
GFR, Estimated: 24 mL/min — ABNORMAL LOW (ref >60)
Glucose, Bld: 91 mg/dL (ref 70–99)
Potassium: 3.8 mmol/L (ref 3.5–5.1)
Sodium: 144 mmol/L (ref 135–145)

## 2021-10-28 LAB — GLUCOSE, CAPILLARY: Glucose-Capillary: 87 mg/dL (ref 70–99)

## 2021-10-28 MED ORDER — CARVEDILOL 12.5 MG PO TABS
12.5000 mg | ORAL_TABLET | Freq: Two times a day (BID) | ORAL | Status: DC
  Administered 2021-10-28: 12.5 mg via ORAL
  Filled 2021-10-28: qty 1

## 2021-10-28 MED ORDER — HYDRALAZINE HCL 20 MG/ML IJ SOLN: 10.0000 mg | Freq: Four times a day (QID) | INTRAMUSCULAR | Status: DC | PRN

## 2021-10-28 MED ORDER — MOLNUPIRAVIR EUA 200MG CAPSULE
4.0000 | ORAL_CAPSULE | Freq: Two times a day (BID) | ORAL | 0 refills | Status: AC
Start: 2021-10-28 — End: 2021-10-30
  Filled 2021-10-28: qty 16, 2d supply, fill #0

## 2021-10-28 MED ORDER — LISINOPRIL 10 MG PO TABS
10.0000 mg | ORAL_TABLET | Freq: Every day | ORAL | Status: DC
Start: 2021-10-28 — End: 2021-10-28
  Administered 2021-10-28: 10 mg via ORAL
  Filled 2021-10-28: qty 1

## 2021-10-28 MED ORDER — CLONIDINE HCL 0.1 MG PO TABS
0.1000 mg | ORAL_TABLET | Freq: Two times a day (BID) | ORAL | Status: DC
  Administered 2021-10-28: 0.1 mg via ORAL
  Filled 2021-10-28: qty 1

## 2021-10-28 MED ORDER — OLANZAPINE 2.5 MG PO TABS
2.5000 mg | ORAL_TABLET | Freq: Every day | ORAL | 0 refills | Status: DC
  Filled 2021-10-28: qty 7, 7d supply, fill #0

## 2021-10-28 MED ORDER — NIFEDIPINE ER OSMOTIC RELEASE 60 MG PO TB24
60.0000 mg | ORAL_TABLET | Freq: Every day | ORAL | Status: DC
  Administered 2021-10-28: 60 mg via ORAL
  Filled 2021-10-28: qty 1

## 2021-10-28 MED ORDER — CARVEDILOL 12.5 MG PO TABS
12.5000 mg | ORAL_TABLET | Freq: Once | ORAL | Status: AC
  Administered 2021-10-28: 12.5 mg via ORAL
  Filled 2021-10-28: qty 1

## 2021-10-28 NOTE — Progress Notes (Incomplete)
Patient very upset this am, refusing Atarax. Stating:"don't offer anything to me, I will let you know if I need something. " Requesting to know if any meds would hurt her kidneys. Info passed on to Dr Doristine Bosworth, including still hypertensive. Also asked floor pharmacist of patients request, they are stating we are not giving any meds which could hurt her kidneys.  ?

## 2021-10-28 NOTE — Progress Notes (Signed)
Teresa Poole's agitation is escalating. She is severely agitated and screaming she will sue [place] for keeping her hostage. I advised right of leaving AMA, she is stating she was not advised on anything. Nobody talked to her about her condition and medications. she is not rational. BP 208/88. Dr Doristine Bosworth notified. ?

## 2021-10-28 NOTE — Discharge Summary (Addendum)
PatientPhysician Discharge Summary  Teresa Poole YBO:[id number] DOB: August 11, 1951 DOA: 10/25/2021  PCP: [name], MD  Admit date: 10/25/2021 Discharge date: 10/28/2021 30 Day Unplanned Readmission Risk Score    Flowsheet Row ED to Hosp-Admission (Current) from 10/25/2021 in [facility]  30 Day Unplanned Readmission Risk Score (%) 63.28 Filed at 10/28/2021 0801       This score is the patient's risk of an unplanned readmission within 30 days of being discharged (0 -100%). The score is based on dignosis, age, lab data, medications, orders, and past utilization.   Low:  0-14.9   Medium: 15-21.9   High: 22-29.9   Extreme: 30 and above          Admitted From: Home Disposition: Home  Recommendations for Outpatient Follow-up:  Follow up with PCP in 1-2 weeks Please obtain BMP/CBC in one week Follow-up with psychiatry as outpatient. Please follow up with your PCP on the following pending results: Unresulted Labs (From admission, onward)    None         Home Health: None Equipment/Devices: None  Discharge Condition: Stable CODE STATUS: Full code Diet recommendation: Cardiac  Subjective: Seen and examined.  She has no complaints.  Brief/Interim Summary: Teresa Poole is a 71 y.o. female with medical history significant of CKD IV, NICM with EF of 55-60% and grade 1 diastolic dysfunction, HTN, T2DM, anemia, memory issues who presented to ED with complaints of dizziness and weakness.     Recently admitted on 10/14/21 for acute on chronic renal disease with creatinine of 3.7 at time of admit and concerns for UTI; however, she left AMA early on 10/15/21.    She denies any fever/chills, very slight h/a, no chest pain or palpitations but she does have chest pressure, no shortness of breath or coughing, no stomach pain, no N/V/D, she does have some dysuria and some burning without urination, no urgency or frequency. She does have some incontinence that is new,  no leg swelling. Denies any AH/VH/SI/HI.    She tested positive for covid on Thursday and was started on paxlovid Thursday and Friday, didn't take today. Has had covid vaccine x 2 with pfizer and one booster    She was hemodynamically stable upon presentation this admission.  She was admitted with the following issues.  UTI (urinary tract infection): Urine culture growing multiple species.  Completed 3 days of Rocephin.  She has no symptoms now.   COVID-19 virus infection Asymptomatic. Tested positive on thursday after going to a facility for dizziness and started on paxlovid. Has had 2 days of medication. With GFR <30, not recommended.  Chest x-ray unremarkable.  Started on molnupiravir which she received for 3 days, prescribing 2 more days.   Lactic acidosis: Could be due to dehydration which has now resolved.   Acute renal failure superimposed on stage 4 chronic kidney disease ([place]): Baseline creatinine appears to be around 2.2.  Recently her creatinine was 3.71 when she was hospitalized and left AGAINST MEDICAL ADVICE.  Currently back at baseline.   Uncontrolled hypertension: Blood pressure was controlled and we had only resumed her half dose of Coreg.  Since last 24 hours, blood pressure is elevated.  I have now resumed her back at full dose of Coreg that she was taking PTA which is 25 mg twice daily, nifedipine as well as lisinopril.  Patient will be discharged today if her blood pressure is slightly better controlled.   Chronic diastolic CHF.  Please note  that H&P has documented chronic combined systolic and diastolic congestive heart failure however patient's most recent echo done in October 2020 showed normal ejection fraction with grade 1 diastolic dysfunction.  She appears to be euvolemic.  We will gently hydrate her.  She has no edema.   Type 2 diabetes mellitus with chronic kidney disease, without long-term current use of insulin ([place]) Very well controlled. Recent a1c of 5.7 in  09/2021   Mixed hyperlipidemia Continue zetia    Anemia Baseline 9-10, stable Continue to monitor    Generalized weakness: Resolved.  Seen by PT OT.  No need recommended.   Anxiety: Seen by psychiatry.  They recommended discharging this patient on Zyprexa 2.5 mg p.o. daily for only 7 days.  They recommended follow-up with psychiatry as outpatient as she seems to have some intermittent confusion and baseline memory deficit.  However patient was not suicidal.  She was cleared for discharge.  During my evaluation for last 3 days, patient has always been very pleasant but she does seem to have some intermittent memory deficits.  She will benefit from neuropsychiatry evaluation as outpatient.  Discharge plan was discussed with patient and/or family member and they verbalized understanding and agreed with it.  Discharge Diagnoses:  Principal Problem:   UTI (urinary tract infection) Active Problems:   COVID-19 virus infection   Acute renal failure superimposed on stage 4 chronic kidney disease (HCC)   Uncontrolled hypertension   Chronic combined systolic and diastolic heart failure (HCC)   Type 2 diabetes mellitus with chronic kidney disease, without long-term current use of insulin (HCC)   Mixed hyperlipidemia   Anemia    Discharge Instructions   Allergies as of 10/28/2021       Reactions   Hydralazine Hcl Itching   ENTIRE BODY = burning sensation, also in the eyes (they have become very sensitive to light)   Atorvastatin Other (See Comments)   Per MD - pt not sure of reaction         Medication List     STOP taking these medications    OLANZapine zydis 5 MG disintegrating tablet Commonly known as: ZYPREXA Replaced by: OLANZapine 2.5 MG tablet   predniSONE 5 MG tablet Commonly known as: DELTASONE       TAKE these medications    albuterol 108 (90 Base) MCG/ACT inhaler Commonly known as: VENTOLIN HFA 2 puffs every 4 (four) hours as needed for wheezing or shortness of  breath.   carvedilol 25 MG tablet Commonly known as: COREG Take 25 mg by mouth 2 (two) times daily with a meal.   Cequa 0.09 % Soln Generic drug: cycloSPORINE (PF) Place 1 drop into both eyes every evening.   ezetimibe 10 MG tablet Commonly known as: ZETIA Take 10 mg by mouth daily.   famotidine 40 MG tablet Commonly known as: PEPCID Take 40 mg by mouth daily.   furosemide 20 MG tablet Commonly known as: LASIX Take 20 mg by mouth daily.   glucose blood test strip Commonly known as: ONE TOUCH ULTRA TEST Use to check blood sugar 3 times daily Dx code E11.65 What changed:  how much to take how to take this when to take this   OneTouch Verio test strip Generic drug: glucose blood CHECK BLOOD SUGAR THREE TIMES DAILY What changed: See the new instructions.   hydrOXYzine 10 MG tablet Commonly known as: ATARAX Take 10 mg by mouth 3 (three) times daily as needed for anxiety.   indapamide 2.5 MG tablet  Commonly known as: LOZOL Take 2.5 mg by mouth daily.   lisinopril 10 MG tablet Commonly known as: ZESTRIL Take 10 mg by mouth daily.   molnupiravir EUA 200 mg Caps capsule Commonly known as: LAGEVRIO Take 4 capsules (800 mg total) by mouth 2 (two) times daily for 2 days.   NIFEdipine 60 MG 24 hr tablet Commonly known as: ADALAT CC Take 1 tablet (60 mg total) by mouth daily.   OLANZapine 2.5 MG tablet Commonly known as: ZyPREXA Take 1 tablet (2.5 mg total) by mouth at bedtime for 7 days. Replaces: OLANZapine zydis 5 MG disintegrating tablet   omeprazole 40 MG capsule Commonly known as: PRILOSEC Take 40 mg by mouth daily.   Ozempic (0.25 or 0.5 MG/DOSE) 2 MG/1.5ML Sopn Generic drug: Semaglutide(0.25 or 0.'5MG'$ /DOS) Inject 0.25 mg into the skin once a week. Sundays   polyethylene glycol 17 g packet Commonly known as: MIRALAX / GLYCOLAX Take 17 g by mouth daily.   rosuvastatin 20 MG tablet Commonly known as: CRESTOR Take 20 mg by mouth daily.         Follow-up Information     [name], MD Follow up in 1 week(s).   Specialty: Internal Medicine Contact information: [address] [place] [address] [phone]         Jettie Booze, MD .   Specialties: Cardiology, Radiology, Interventional Cardiology Contact information: [address] [place] [address] [phone]                Allergies  Allergen Reactions   Hydralazine Hcl Itching    ENTIRE BODY = burning sensation, also in the eyes (they have become very sensitive to light)   Atorvastatin Other (See Comments)    Per MD - pt not sure of reaction     Consultations: Psychiatry   Procedures/Studies: DG Chest 2 View  Result Date: 10/25/2021 CLINICAL DATA:  70 year old female with history of pneumonia. Diagnosed with COVID yesterday. EXAM: CHEST - 2 VIEW COMPARISON:  Chest x-ray 09/09/2021. FINDINGS: Lung volumes are normal. No consolidative airspace disease. No pleural effusions. No pneumothorax. No pulmonary nodule or mass noted. Pulmonary vasculature and the cardiomediastinal silhouette are within normal limits. Atherosclerotic calcifications are noted in the thoracic aorta. IMPRESSION: 1.  No radiographic evidence of acute cardiopulmonary disease. 2. Aortic atherosclerosis. Electronically Signed   By: Vinnie Langton M.D.   On: 10/25/2021 10:17   US RENAL  Result Date: 10/25/2021 CLINICAL DATA:  Acute kidney injury superimposed on chronic kidney disease. EXAM: RENAL / URINARY TRACT ULTRASOUND COMPLETE COMPARISON:  CT abdomen and pelvis 09/22/2021. FINDINGS: Right Kidney: Renal measurements: 7.2 x 4.1 x 3.7 cm = volume: 56 mL. Echogenicity is increased. No hydronephrosis. There are 2 cysts in the right kidney measuring up to 1.5 cm. Left Kidney: Renal measurements: 7.6 x 3.6 x 3.4 cm = volume: 49 mL. Echogenicity is increased. No mass or hydronephrosis visualized. Bladder: Not visualized. Other: None.  IMPRESSION: 1. Bilateral echogenic kidneys likely related to medical renal disease. Bilateral renal atrophy. 2. No hydronephrosis. Electronically Signed   By: Ronney Asters M.D.   On: 10/25/2021 22:22     Discharge Exam: Vitals:   10/28/21 0856 10/28/21 1000  BP: (!) 208/79   Pulse: 66   Resp: 14   Temp:  98.3 F (36.8 C)  SpO2:     Vitals:   10/27/21 2151 10/28/21 0454 10/28/21 0856 10/28/21 1000  BP: (!) 157/63 (!) 195/85 Marland Kitchen)  208/79   Pulse: 80 78 66   Resp: '15 13 14   '$ Temp: 98.4 F (36.9 C) 97.9 F (36.6 C)  98.3 F (36.8 C)  TempSrc: Oral Oral    SpO2: 100% 100%    Weight:      Height:        General: Pt is alert, awake, not in acute distress Cardiovascular: RRR, S1/S2 +, no rubs, no gallops Respiratory: CTA bilaterally, no wheezing, no rhonchi Abdominal: Soft, NT, ND, bowel sounds + Extremities: no edema, no cyanosis    The results of significant diagnostics from this hospitalization (including imaging, microbiology, ancillary and laboratory) are listed below for reference.     Microbiology: Recent Results (from the past 240 hour(s))  Resp Panel by RT-PCR (Flu A&B, Covid) Nasopharyngeal Swab     Status: Abnormal   Collection Time: 10/25/21  9:46 AM   Specimen: Nasopharyngeal Swab; Nasopharyngeal(NP) swabs in vial transport medium  Result Value Ref Range Status   SARS Coronavirus 2 by RT PCR POSITIVE (A) NEGATIVE Final    Comment: (NOTE) SARS-CoV-2 target nucleic acids are DETECTED.  The SARS-CoV-2 RNA is generally detectable in upper respiratory specimens during the acute phase of infection. Positive results are indicative of the presence of the identified virus, but do not rule out bacterial infection or co-infection with other pathogens not detected by the test. Clinical correlation with patient history and other diagnostic information is necessary to determine patient infection status. The expected result is Negative.  Fact Sheet for  Patients: EntrepreneurPulse.com.au  Fact Sheet for Healthcare Providers: IncredibleEmployment.be  This test is not yet approved or cleared by the [place] FDA and  has been authorized for detection and/or diagnosis of SARS-CoV-2 by FDA under an Emergency Use Authorization (EUA).  This EUA will remain in effect (meaning this test can be used) for the duration of  the COVID-19 declaration under Section 564(b)(1) of the A ct, 21 U.S.C. section 360bbb-3(b)(1), unless the authorization is terminated or revoked sooner.     Influenza A by PCR NEGATIVE NEGATIVE Final   Influenza B by PCR NEGATIVE NEGATIVE Final    Comment: (NOTE) The Xpert Xpress SARS-CoV-2/FLU/RSV plus assay is intended as an aid in the diagnosis of influenza from Nasopharyngeal swab specimens and should not be used as a sole basis for treatment. Nasal washings and aspirates are unacceptable for Xpert Xpress SARS-CoV-2/FLU/RSV testing.  Fact Sheet for Patients: EntrepreneurPulse.com.au  Fact Sheet for Healthcare Providers: IncredibleEmployment.be  This test is not yet approved or cleared by the [place] FDA and has been authorized for detection and/or diagnosis of SARS-CoV-2 by FDA under an Emergency Use Authorization (EUA). This EUA will remain in effect (meaning this test can be used) for the duration of the COVID-19 declaration under Section 564(b)(1) of the Act, 21 U.S.C. section 360bbb-3(b)(1), unless the authorization is terminated or revoked.  Performed at [facility], [address] [place], [place] [address]   Urine Culture     Status: Abnormal   Collection Time: 10/25/21 12:50 PM   Specimen: Urine, Clean Catch  Result Value Ref Range Status   Specimen Description   Final    URINE, CLEAN CATCH Performed at [facility], [address] [place], [place] [address]    Special Requests   Final     NONE Performed at [facility], [address] [place], [place] [address]    Culture MULTIPLE SPECIES PRESENT, SUGGEST RECOLLECTION (A)  Final   Report Status 10/27/2021 FINAL  Final  Blood culture (routine x 2)     Status: None (Preliminary result)   Collection Time: 10/25/21  1:14 PM   Specimen: BLOOD LEFT WRIST  Result Value Ref Range Status   Specimen Description   Final    BLOOD LEFT WRIST Performed at [facility], [address] [place], [place] [address]    Special Requests   Final    BOTTLES DRAWN AEROBIC AND ANAEROBIC Blood Culture adequate volume Performed at [facility], [address] [place], [place] [address]    Culture   Final    NO GROWTH 2 DAYS Performed at [facility], [place] [address], [place], [place] [address]    Report Status PENDING  Incomplete  Blood culture (routine x 2)     Status: None (Preliminary result)   Collection Time: 10/25/21  1:22 PM   Specimen: BLOOD RIGHT HAND  Result Value Ref Range Status   Specimen Description   Final    BLOOD RIGHT HAND Performed at [facility], [address] [place], [place] [address]    Special Requests   Final    BOTTLES DRAWN AEROBIC AND ANAEROBIC Blood Culture adequate volume Performed at [facility], [address] [place], [place] [address]    Culture   Final    NO GROWTH 2 DAYS Performed at [facility], [place] [address], [place], [place] [address]    Report Status PENDING  Incomplete     Labs: BNP (last 3 results) Recent Labs    06/11/21 0342  BNP 19.3   Basic Metabolic Panel: Recent Labs  Lab 10/25/21 0946 10/26/21 0228 10/27/21 0405 10/28/21 0628  NA 134* 137 141 144  K 4.5 4.0 3.8 3.8  CL 98 102 111 114*  CO2 22 23 19* 20*  GLUCOSE 283* 174* 103* 91  BUN 45* 52* 51* 41*  CREATININE 2.78* 3.21* 3.09* 2.21*  CALCIUM 8.4* 7.8* 7.8* 8.3*   Liver Function Tests: Recent Labs  Lab 10/25/21 0946  AST 26  ALT 16   ALKPHOS 48  BILITOT 0.5  PROT 6.9  ALBUMIN 3.2*   No results for input(s): LIPASE, AMYLASE in the last 168 hours. No results for input(s): AMMONIA in the last 168 hours. CBC: Recent Labs  Lab 10/25/21 0946 10/26/21 0228 10/27/21 0405  WBC 6.3 5.6 6.0  NEUTROABS  --   --  3.2  HGB 9.5* 8.2* 9.1*  HCT 28.4* 24.6* 27.4*  MCV 87.1 86.6 88.1  PLT 230 187 205   Cardiac Enzymes: No results for input(s): CKTOTAL, CKMB, CKMBINDEX, TROPONINI in the last 168 hours. BNP: Invalid input(s): POCBNP CBG: Recent Labs  Lab 10/27/21 0728 10/27/21 1116 10/27/21 1629 10/27/21 2148 10/28/21 0739  GLUCAP 88 113* 96 107* 87   D-Dimer Recent Labs    10/25/21 1827  DDIMER 0.70*   Hgb A1c No results for input(s): HGBA1C in the last 72 hours. Lipid Profile No results for input(s): CHOL, HDL, LDLCALC, TRIG, CHOLHDL, LDLDIRECT in the last 72 hours. Thyroid function studies No results for input(s): TSH, T4TOTAL, T3FREE, THYROIDAB in the last 72 hours.  Invalid input(s): FREET3 Anemia work up Recent Labs    10/25/21 1827  FERRITIN 74   Urinalysis    Component Value Date/Time   COLORURINE YELLOW 10/25/2021 [place] 10/25/2021 1305   LABSPEC 1.025 10/25/2021 1305  PHURINE 5.0 10/25/2021 1305   GLUCOSEU NEGATIVE 10/25/2021 1305   HGBUR TRACE (A) 10/25/2021 1305   BILIRUBINUR NEGATIVE 10/25/2021 1305   KETONESUR NEGATIVE 10/25/2021 1305   PROTEINUR 100 (A) 10/25/2021 1305   UROBILINOGEN 0.2 09/22/2013 1451   NITRITE NEGATIVE 10/25/2021 1305   LEUKOCYTESUR SMALL (A) 10/25/2021 1305   Sepsis Labs Invalid input(s): PROCALCITONIN,  WBC,  LACTICIDVEN Microbiology Recent Results (from the past 240 hour(s))  Resp Panel by RT-PCR (Flu A&B, Covid) Nasopharyngeal Swab     Status: Abnormal   Collection Time: 10/25/21  9:46 AM   Specimen: Nasopharyngeal Swab; Nasopharyngeal(NP) swabs in vial transport medium  Result Value Ref Range Status   SARS Coronavirus 2 by RT  PCR POSITIVE (A) NEGATIVE Final    Comment: (NOTE) SARS-CoV-2 target nucleic acids are DETECTED.  The SARS-CoV-2 RNA is generally detectable in upper respiratory specimens during the acute phase of infection. Positive results are indicative of the presence of the identified virus, but do not rule out bacterial infection or co-infection with other pathogens not detected by the test. Clinical correlation with patient history and other diagnostic information is necessary to determine patient infection status. The expected result is Negative.  Fact Sheet for Patients: EntrepreneurPulse.com.au  Fact Sheet for Healthcare Providers: IncredibleEmployment.be  This test is not yet approved or cleared by the [place] FDA and  has been authorized for detection and/or diagnosis of SARS-CoV-2 by FDA under an Emergency Use Authorization (EUA).  This EUA will remain in effect (meaning this test can be used) for the duration of  the COVID-19 declaration under Section 564(b)(1) of the A ct, 21 U.S.C. section 360bbb-3(b)(1), unless the authorization is terminated or revoked sooner.     Influenza A by PCR NEGATIVE NEGATIVE Final   Influenza B by PCR NEGATIVE NEGATIVE Final    Comment: (NOTE) The Xpert Xpress SARS-CoV-2/FLU/RSV plus assay is intended as an aid in the diagnosis of influenza from Nasopharyngeal swab specimens and should not be used as a sole basis for treatment. Nasal washings and aspirates are unacceptable for Xpert Xpress SARS-CoV-2/FLU/RSV testing.  Fact Sheet for Patients: EntrepreneurPulse.com.au  Fact Sheet for Healthcare Providers: IncredibleEmployment.be  This test is not yet approved or cleared by the [place] FDA and has been authorized for detection and/or diagnosis of SARS-CoV-2 by FDA under an Emergency Use Authorization (EUA). This EUA will remain in effect (meaning this test can be  used) for the duration of the COVID-19 declaration under Section 564(b)(1) of the Act, 21 U.S.C. section 360bbb-3(b)(1), unless the authorization is terminated or revoked.  Performed at [facility], [address] [place], [place] [address]   Urine Culture     Status: Abnormal   Collection Time: 10/25/21 12:50 PM   Specimen: Urine, Clean Catch  Result Value Ref Range Status   Specimen Description   Final    URINE, CLEAN CATCH Performed at [facility], [address] [place], [place] [address]    Special Requests   Final    NONE Performed at [facility], [address] [place], [place] [address]    Culture MULTIPLE SPECIES PRESENT, SUGGEST RECOLLECTION (A)  Final   Report Status 10/27/2021 FINAL  Final  Blood culture (routine x 2)     Status: None (Preliminary result)   Collection Time: 10/25/21  1:14 PM   Specimen: BLOOD LEFT WRIST  Result Value Ref Range Status   Specimen Description   Final  BLOOD LEFT WRIST Performed at [facility], [address] [place], [place] [address]    Special Requests   Final    BOTTLES DRAWN AEROBIC AND ANAEROBIC Blood Culture adequate volume Performed at [facility], [address] [place], [place] [address]    Culture   Final    NO GROWTH 2 DAYS Performed at [facility], [place] [address], [place], [place] [address]    Report Status PENDING  Incomplete  Blood culture (routine x 2)     Status: None (Preliminary result)   Collection Time: 10/25/21  1:22 PM   Specimen: BLOOD RIGHT HAND  Result Value Ref Range Status   Specimen Description   Final    BLOOD RIGHT HAND Performed at [facility], [address] [place], [place] [address]    Special Requests   Final    BOTTLES DRAWN AEROBIC AND ANAEROBIC Blood Culture adequate volume Performed at [facility], [address] [place], [place] [address]    Culture   Final    NO GROWTH 2  DAYS Performed at [facility], [place] [address], [place], [place] [address]    Report Status PENDING  Incomplete     Time coordinating discharge: Over 30 minutes  SIGNED:   Darliss Cheney, MD  [facility] 10/28/2021, 10:37 AM *Please note that this is a verbal dictation therefore any spelling or grammatical errors are due to the "[employer]" system interpretation. If 7PM-7AM, please contact night-coverage [email or web address]

## 2021-10-28 NOTE — Progress Notes (Signed)
Inpatient psychiatry consultation    Reason for Consult: Assess suicide risk Referring Physician:  Orma Flaming Location of Patient: [facility] Location of Provider: [facility]  10/28/2021 5:29 PM Teresa Poole  MRN:  [id number] Subjective:  Teresa Poole is a 71 y.o. female patient with medical history significant of CKD IV, NICM with EF of 55-60% and grade 1 diastolic dysfunction, HTN, T2DM, anemia, memory issues who presented to ED on 10/25/2021 with complaints of dizziness and weakness.  She tested positive for COVID on Thursday and was started on paxlovid Thursday and Friday, didn't take today. Has had COVID vaccine x 2 with pfizer and one booster  Patient had a suicide screening completed which was positive for suicide risk.  Psychiatry was consulted to further evaluate suicidal risk.  On psychiatry reassessment patient endorses that she is feeling "calm" and back at her baseline. Patient reports that she is eating and sleeping well. Patient denies SI, HI, and AVH. Patient reports that she does not have a hx of SI and endorses that if she did suddenly have SI she would reach out to her PCP, as this is "the type of person I am." Patient endorses that she enjoys being alive. Patient did endorse what appeared to be an illusion of the lights hitting her privacy curtains and they appeared to resemble Christmas decorations; however patient endorsed that she knew that they were not really Christmas decorations, but she enjoyed the image.   Objectively, patient appears to be a in a good mood and is laughing and joking. Patient did not appear to have rapid speech or thoughts.  Principal Problem: UTI (urinary tract infection) Diagnosis: Principal Problem:   UTI (urinary tract infection) Active Problems:   Uncontrolled hypertension   Type 2 diabetes mellitus with chronic kidney disease, without long-term current use of insulin (HCC)   Chronic combined  systolic and diastolic heart failure (HCC)   Mixed hyperlipidemia   Acute renal failure superimposed on stage 4 chronic kidney disease ([place])   COVID-19 virus infection   Anemia  Total Time spent with patient: 30 minutes  Past Psychiatric History: Adjustment disorder with anxiety. Possible mild cognitive impairment.  Past Medical History:  Past Medical History:  Diagnosis Date   Allergy    Anemia    Carotid stenosis    Chronic combined systolic and diastolic CHF (congestive heart failure) (HCC)    CKD (chronic kidney disease), stage IV ([place]) 09/24/2013   Pt at [place], Dr. Joelyn Oms   COVID-19 10/24/2021   Diabetes mellitus type 2 in nonobese [facility])    Edema 09/14/2013   GERD (gastroesophageal reflux disease)    History of ovarian cancer    Hyperlipidemia    Hypertension    Mild CAD    non-obstructive by LHC (09/25/2013): Proximal and mid LAD serial 20%, proximal circumflex 30%, mid AV groove circumflex 30%, mid RCA mild plaque.   NICM (nonischemic cardiomyopathy) (HCC)    Obesity (BMI 30-39.9)    RBBB    Renal artery stenosis ([place]) 10/11/2018   Thyroid disease    Seen by specialist    Past Surgical History:  Procedure Laterality Date   LEFT HEART CATHETERIZATION WITH CORONARY ANGIOGRAM N/A 09/25/2013   Procedure: LEFT HEART CATHETERIZATION WITH CORONARY ANGIOGRAM;  Surgeon: Burnell Blanks, MD;  Location: [facility] CATH LAB;  Service: Cardiovascular;  Laterality: N/A;   RENAL ANGIOGRAPHY N/A 10/06/2018   Procedure: RENAL ANGIOGRAPHY;  Surgeon: Marty Heck, MD;  Location: [facility]  INVASIVE CV LAB;  Service: Cardiovascular;  Laterality: N/A;   Family History:  Family History  Problem Relation Age of Onset   Diabetes Father    Hypertension Father    Hypertension Maternal Grandfather    Family Psychiatric  History:  Pt had cousin murdered as infant (aunt with PPD) Cousins (sons of aunt with PPD) with lifelong mental health struggles Some 3rd and 4th cousins with  unknown mental health struggles. Social History:  Social History   Substance and Sexual Activity  Alcohol Use No     Social History   Substance and Sexual Activity  Drug Use No    Social History   Socioeconomic History   Marital status: Single    Spouse name: Not on file   Number of children: 0   Years of education: Not on file   Highest education level: Not on file  Occupational History    Employer: [employer]  Tobacco Use   Smoking status: Never   Smokeless tobacco: Never  Vaping Use   Vaping Use: Never used  Substance and Sexual Activity   Alcohol use: No   Drug use: No   Sexual activity: Not on file  Other Topics Concern   Not on file  Social History Narrative   Works at [employer]   Patient lives at home alone.    Patient has no children.    Patient patient is right handed.    Patient is single.    Social Determinants of Health   Financial Resource Strain: Not on file  Food Insecurity: Not on file  Transportation Needs: Not on file  Physical Activity: Not on file  Stress: Not on file  Social Connections: Not on file   Additional Social History:                         Sleep: Good  Appetite:  Good  Current Medications: Current Facility-Administered Medications  Medication Dose Route Frequency Provider Last Rate Last Admin   0.9 %  sodium chloride infusion   Intravenous Continuous Darliss Cheney, MD   Stopped at 10/28/21 1000   acetaminophen (TYLENOL) tablet 650 mg  650 mg Oral Q6H PRN Orma Flaming, MD       Or   acetaminophen (TYLENOL) suppository 650 mg  650 mg Rectal Q6H PRN Orma Flaming, MD       albuterol (VENTOLIN HFA) 108 (90 Base) MCG/ACT inhaler 2 puff  2 puff Inhalation Q4H PRN Orma Flaming, MD       carvedilol (COREG) tablet 12.5 mg  12.5 mg Oral BID WC Kristopher Oppenheim, DO   12.5 mg at 10/28/21 0539   cefTRIAXone (ROCEPHIN) 1 g in sodium chloride 0.9 % 100 mL IVPB  1 g Intravenous Q24H Orma Flaming, MD 200 mL/hr at 10/27/21  1355 1 g at 10/27/21 1355   cloNIDine (CATAPRES) tablet 0.1 mg  0.1 mg Oral BID Darliss Cheney, MD   0.1 mg at 10/28/21 1257   cycloSPORINE (RESTASIS) 0.05 % ophthalmic emulsion 1 drop  1 drop Both Eyes BID Orma Flaming, MD   1 drop at 10/28/21 0917   enoxaparin (LOVENOX) injection 30 mg  30 mg Subcutaneous Q24H Orma Flaming, MD   30 mg at 10/27/21 2202   ezetimibe (ZETIA) tablet 10 mg  10 mg Oral Daily Orma Flaming, MD   10 mg at 10/28/21 0916   famotidine (PEPCID) tablet 20 mg  20 mg Oral Daily Pham, Minh Q,  RPH-CPP   20 mg at 10/28/21 0913   hydrOXYzine (ATARAX) tablet 10 mg  10 mg Oral TID PRN Orma Flaming, MD       insulin aspart (novoLOG) injection 0-6 Units  0-6 Units Subcutaneous TID WC Orma Flaming, MD   1 Units at 10/26/21 1250   lisinopril (ZESTRIL) tablet 10 mg  10 mg Oral Daily Darliss Cheney, MD   10 mg at 10/28/21 1003   molnupiravir EUA (LAGEVRIO) capsule 800 mg  4 capsule Oral BID Darliss Cheney, MD   800 mg at 10/28/21 0918   NIFEdipine (PROCARDIA XL/NIFEDICAL XL) 24 hr tablet 60 mg  60 mg Oral Daily Darliss Cheney, MD   60 mg at 10/28/21 1010   nitroGLYCERIN (NITROSTAT) SL tablet 0.4 mg  0.4 mg Sublingual Q5 min PRN Opyd, Ilene Qua, MD   0.4 mg at 10/25/21 2240   OLANZapine zydis (ZYPREXA) disintegrating tablet 5 mg  5 mg Oral Daily Orma Flaming, MD   5 mg at 10/28/21 0913   ondansetron (ZOFRAN) tablet 4 mg  4 mg Oral Q6H PRN Orma Flaming, MD       Or   ondansetron [facility]) injection 4 mg  4 mg Intravenous Q6H PRN Orma Flaming, MD       oxyCODONE (Oxy IR/ROXICODONE) immediate release tablet 5 mg  5 mg Oral Once PRN Opyd, Ilene Qua, MD       pantoprazole (PROTONIX) EC tablet 80 mg  80 mg Oral Daily Orma Flaming, MD   80 mg at 10/28/21 0914   rosuvastatin (CRESTOR) tablet 20 mg  20 mg Oral Daily Orma Flaming, MD   20 mg at 10/28/21 3903   Current Outpatient Medications  Medication Sig Dispense Refill   albuterol (VENTOLIN HFA) 108 (90 Base) MCG/ACT inhaler 2  puffs every 4 (four) hours as needed for wheezing or shortness of breath.     carvedilol (COREG) 25 MG tablet Take 25 mg by mouth 2 (two) times daily with a meal.     cycloSPORINE, PF, (CEQUA) 0.09 % SOLN Place 1 drop into both eyes every evening.     ezetimibe (ZETIA) 10 MG tablet Take 10 mg by mouth daily.     famotidine (PEPCID) 40 MG tablet Take 40 mg by mouth daily.     furosemide (LASIX) 20 MG tablet Take 20 mg by mouth daily.     hydrOXYzine (ATARAX) 10 MG tablet Take 10 mg by mouth 3 (three) times daily as needed for anxiety.     indapamide (LOZOL) 2.5 MG tablet Take 2.5 mg by mouth daily.     lisinopril (ZESTRIL) 10 MG tablet Take 10 mg by mouth daily.     NIFEdipine (ADALAT CC) 60 MG 24 hr tablet Take 1 tablet (60 mg total) by mouth daily. 1 tablet 0   OLANZapine (ZYPREXA) 2.5 MG tablet Take 1 tablet (2.5 mg total) by mouth at bedtime for 7 days. 7 tablet 0   omeprazole (PRILOSEC) 40 MG capsule Take 40 mg by mouth daily.     OZEMPIC, 0.25 OR 0.5 MG/DOSE, 2 MG/1.5ML SOPN Inject 0.25 mg into the skin once a week. Sundays     rosuvastatin (CRESTOR) 20 MG tablet Take 20 mg by mouth daily.     glucose blood (ONE TOUCH ULTRA TEST) test strip Use to check blood sugar 3 times daily Dx code E11.65 (Patient taking differently: 1 each by Other route See admin instructions. Use to check blood sugar 3 times daily Dx code E11.65) 100 each  3   molnupiravir EUA (LAGEVRIO) 200 mg CAPS capsule Take 4 capsules (800 mg total) by mouth 2 (two) times daily for 2 days. 16 capsule 0   ONETOUCH VERIO test strip CHECK BLOOD SUGAR THREE TIMES DAILY (Patient taking differently: 1 each by Other route in the morning, at noon, and at bedtime.) 200 each 0   polyethylene glycol (MIRALAX / GLYCOLAX) 17 g packet Take 17 g by mouth daily. (Patient not taking: Reported on 10/25/2021) 14 each 0    Lab Results:  Results for orders placed or performed during the hospital encounter of 10/25/21 (from the past 48 hour(s))  CBC  with Differential/Platelet     Status: Abnormal   Collection Time: 10/27/21  4:05 AM  Result Value Ref Range   WBC 6.0 4.0 - 10.5 K/uL   RBC 3.11 (L) 3.87 - 5.11 MIL/uL   Hemoglobin 9.1 (L) 12.0 - 15.0 g/dL   HCT 27.4 (L) 36.0 - 46.0 %   MCV 88.1 80.0 - 100.0 fL   MCH 29.3 26.0 - 34.0 pg   MCHC 33.2 30.0 - 36.0 g/dL   RDW 13.2 11.5 - 15.5 %   Platelets 205 150 - 400 K/uL   nRBC 0.0 0.0 - 0.2 %   Neutrophils Relative % 54 %   Neutro Abs 3.2 1.7 - 7.7 K/uL   Lymphocytes Relative 40 %   Lymphs Abs 2.4 0.7 - 4.0 K/uL   Monocytes Relative 6 %   Monocytes Absolute 0.3 0.1 - 1.0 K/uL   Eosinophils Relative 0 %   Eosinophils Absolute 0.0 0.0 - 0.5 K/uL   Basophils Relative 0 %   Basophils Absolute 0.0 0.0 - 0.1 K/uL   Immature Granulocytes 0 %   Abs Immature Granulocytes 0.01 0.00 - 0.07 K/uL    Comment: Performed at [facility], [address], [place], [place] [address]  Basic metabolic panel     Status: Abnormal   Collection Time: 10/27/21  4:05 AM  Result Value Ref Range   Sodium 141 135 - 145 mmol/L   Potassium 3.8 3.5 - 5.1 mmol/L   Chloride 111 98 - 111 mmol/L   CO2 19 (L) 22 - 32 mmol/L   Glucose, Bld 103 (H) 70 - 99 mg/dL    Comment: Glucose reference range applies only to samples taken after fasting for at least 8 hours.   BUN 51 (H) 8 - 23 mg/dL   Creatinine, Ser 3.09 (H) 0.44 - 1.00 mg/dL   Calcium 7.8 (L) 8.9 - 10.3 mg/dL   GFR, Estimated 16 (L) >60 mL/min    Comment: (NOTE) Calculated using the CKD-EPI Creatinine Equation (2021)    Anion gap 11 5 - 15    Comment: Performed at [facility] [address], [place], [place] [address]  Glucose, capillary     Status: None   Collection Time: 10/27/21  7:28 AM  Result Value Ref Range   Glucose-Capillary 88 70 - 99 mg/dL    Comment: Glucose reference range applies only to samples taken after fasting for at least 8 hours.  Glucose, capillary     Status: Abnormal   Collection Time: 10/27/21 11:16 AM   Result Value Ref Range   Glucose-Capillary 113 (H) 70 - 99 mg/dL    Comment: Glucose reference range applies only to samples taken after fasting for at least 8 hours.  Glucose, capillary     Status: None   Collection Time: 10/27/21  4:29 PM  Result  Value Ref Range   Glucose-Capillary 96 70 - 99 mg/dL    Comment: Glucose reference range applies only to samples taken after fasting for at least 8 hours.  Glucose, capillary     Status: Abnormal   Collection Time: 10/27/21  9:48 PM  Result Value Ref Range   Glucose-Capillary 107 (H) 70 - 99 mg/dL    Comment: Glucose reference range applies only to samples taken after fasting for at least 8 hours.  Basic metabolic panel     Status: Abnormal   Collection Time: 10/28/21  6:28 AM  Result Value Ref Range   Sodium 144 135 - 145 mmol/L   Potassium 3.8 3.5 - 5.1 mmol/L   Chloride 114 (H) 98 - 111 mmol/L   CO2 20 (L) 22 - 32 mmol/L   Glucose, Bld 91 70 - 99 mg/dL    Comment: Glucose reference range applies only to samples taken after fasting for at least 8 hours.   BUN 41 (H) 8 - 23 mg/dL   Creatinine, Ser 2.21 (H) 0.44 - 1.00 mg/dL   Calcium 8.3 (L) 8.9 - 10.3 mg/dL   GFR, Estimated 24 (L) >60 mL/min    Comment: (NOTE) Calculated using the CKD-EPI Creatinine Equation (2021)    Anion gap 10 5 - 15    Comment: Performed at [facility] [address] [place], [place] [address]  Glucose, capillary     Status: None   Collection Time: 10/28/21  7:39 AM  Result Value Ref Range   Glucose-Capillary 87 70 - 99 mg/dL    Comment: Glucose reference range applies only to samples taken after fasting for at least 8 hours.    Blood Alcohol level:  Lab Results  Component Value Date   ETH <10 89/37/3428    Metabolic Disorder Labs: Lab Results  Component Value Date   HGBA1C 5.7 (H) 09/24/2021   MPG 116.89 09/24/2021   MPG 384 (H) 09/07/2013   No results found for: PROLACTIN Lab Results  Component Value Date   CHOL 281 (H) 03/08/2020    TRIG 181.0 (H) 03/08/2020   HDL 53.30 03/08/2020   CHOLHDL 5 03/08/2020   VLDL 36.2 03/08/2020   LDLCALC 192 (H) 03/08/2020   LDLCALC 181 (H) 04/12/2019    Physical Findings: AIMS:  , ,  ,  ,    CIWA:    COWS:     Musculoskeletal: Strength & Muscle Tone: within normal limits Gait & Station:sitting up in bed Patient leans: N/A  Psychiatric Specialty Exam:  Presentation  General Appearance: Appropriate for Environment; Casual  Eye Contact:Good  Speech:Clear and Coherent  Speech Volume:Normal  Handedness:Right   Mood and Affect  Mood:Euthymic  Affect:Appropriate; Congruent   Thought Process  Thought Processes:Goal Directed  Descriptions of Associations:Circumstantial  Orientation:Full (Time, Place and Person)  Thought Content:Logical  History of Schizophrenia/Schizoaffective disorder:No data recorded Duration of Psychotic Symptoms:No data recorded Hallucinations:Hallucinations: None  Ideas of Reference:None  Suicidal Thoughts:Suicidal Thoughts: No  Homicidal Thoughts:Homicidal Thoughts: No   Sensorium  Memory:Immediate Fair  Judgment:Fair  Insight:Fair   Executive Functions  Concentration:Fair  Attention Span:Fair  [place]   Psychomotor Activity  Psychomotor Activity:Psychomotor Activity: Normal   Assets  Assets:Resilience; Armed forces logistics/support/administrative officer; Housing   Sleep  Sleep:Sleep: Good    Physical Exam: Physical Exam Constitutional:      Appearance: Normal appearance.  HENT:     Head: Normocephalic and atraumatic.  Pulmonary:     Effort: Pulmonary effort  is normal.  Neurological:     Mental Status: She is alert and oriented to person, place, and time.   Review of Systems  Psychiatric/Behavioral:  Negative for hallucinations and suicidal ideas. The patient does not have insomnia.   Blood pressure (!) 157/78, pulse 70, temperature 98.3 F (36.8 C), resp. rate 14, height '5\' 7"'$   (1.702 m), weight 71.9 kg, SpO2 100 %. Body mass index is 24.83 kg/m.   Treatment Plan Summary: Daily contact with patient to assess and evaluate symptoms and progress in treatment and Medication management  Follow up with Neurology due to possible concerns for dementia.  Follow up with Neuropsychiatrics for outpatient psychiatry.   Disposition: Patient does not meet criteria for psychiatric inpatient admission.   If there has not been any documented cognitive assessment, would recommend obtaining her baseline.   Patient is no longer on Steroids and appears to be back at her baseline. Have recommended that patient receive a 7 day course of Zyprexa 2.'5mg'$  and then discontinue, to complete taper.   PGY-2 Freida Busman, MD 10/28/2021, 5:29 PM

## 2021-10-28 NOTE — TOC Transition Note (Signed)
Transition of Care (TOC) - CM/SW Discharge Note ? ? ?Patient Details  ?Name: Teresa Poole ?MRN: [id number] ?Date of Birth: May 28, 1951 ? ?Transition of Care (TOC) CM/SW Contact:  ?Tom-Johnson, Renea Ee, RN ?Phone Number: ?10/28/2021, 2:59 PM ? ? ?Clinical Narrative:    ? ?Patient is scheduled for discharge today. Admitted for UTI and found to be Covid positive. From home alone. No recommendations by PT/OT. Denies any TOC needs. Outpatient Psych followup. Family to transport at discharge. No further TOC needs noted. ? ? ?Barriers to Discharge: Barriers Resolved ? ? ?Patient Goals and CMS Choice ?Patient states their goals for this hospitalization and ongoing recovery are:: To return home ?CMS Medicare.gov Compare Post Acute Care list provided to:: Patient ?Choice offered to / list presented to : NA ? ?Discharge Placement ?  ?           ?  ?  ?  ?  ? ?Discharge Plan and Services ?  ?  ?           ?DME Arranged: N/A ?DME Agency: NA ?  ?  ?  ?HH Arranged: NA ?Arab Agency: NA ?  ?  ?  ? ?Social Determinants of Health (SDOH) Interventions ?  ? ? ?Readmission Risk Interventions ?Readmission Risk Prevention Plan 10/28/2021  ?Transportation Screening Complete  ?Medication Review Press photographer) Complete  ?PCP or Specialist appointment within 3-5 days of discharge Complete  ?[place] or Home Care Consult Complete  ?SW Recovery Care/Counseling Consult Complete  ?Palliative Care Screening Not Applicable  ?[place] Not Applicable  ?Some recent data might be hidden  ? ? ? ? ? ?

## 2021-10-29 ENCOUNTER — Telehealth (HOSPITAL_COMMUNITY): Payer: Self-pay | Admitting: Internal Medicine

## 2021-10-29 DIAGNOSIS — F309 Manic episode, unspecified: Secondary | ICD-10-CM

## 2021-10-29 NOTE — BH Assessment (Signed)
Care Management - [facility] Follow Up Discharges  ° °Writer attempted to make contact with patient today and was unsuccessful.  Writer left a HIPPA compliant voice message.  ° °Per chart review, patient was provided with outpatient resources. ° °

## 2021-10-30 LAB — CULTURE, BLOOD (ROUTINE X 2)
Culture: NO GROWTH
Culture: NO GROWTH
Special Requests: ADEQUATE
Special Requests: ADEQUATE

## 2021-11-10 ENCOUNTER — Other Ambulatory Visit (HOSPITAL_COMMUNITY): Payer: Self-pay

## 2021-11-29 ENCOUNTER — Other Ambulatory Visit: Payer: Self-pay

## 2021-11-29 ENCOUNTER — Emergency Department (HOSPITAL_BASED_OUTPATIENT_CLINIC_OR_DEPARTMENT_OTHER): Payer: Medicare PPO

## 2021-11-29 ENCOUNTER — Emergency Department (HOSPITAL_BASED_OUTPATIENT_CLINIC_OR_DEPARTMENT_OTHER)
Admission: EM | Admit: 2021-11-29 | Discharge: 2021-11-29 | Disposition: A | Discharge status: Home / Self Care | Payer: Medicare PPO | Attending: Emergency Medicine | Admitting: Emergency Medicine

## 2021-11-29 ENCOUNTER — Encounter (HOSPITAL_BASED_OUTPATIENT_CLINIC_OR_DEPARTMENT_OTHER): Payer: Self-pay

## 2021-11-29 DIAGNOSIS — E1122 Type 2 diabetes mellitus with diabetic chronic kidney disease: Secondary | ICD-10-CM | POA: Insufficient documentation

## 2021-11-29 DIAGNOSIS — N189 Chronic kidney disease, unspecified: Secondary | ICD-10-CM | POA: Insufficient documentation

## 2021-11-29 DIAGNOSIS — Z79899 Other long term (current) drug therapy: Secondary | ICD-10-CM | POA: Diagnosis not present

## 2021-11-29 DIAGNOSIS — I130 Hypertensive heart and chronic kidney disease with heart failure and stage 1 through stage 4 chronic kidney disease, or unspecified chronic kidney disease: Secondary | ICD-10-CM | POA: Diagnosis not present

## 2021-11-29 DIAGNOSIS — D649 Anemia, unspecified: Secondary | ICD-10-CM | POA: Insufficient documentation

## 2021-11-29 DIAGNOSIS — R519 Headache, unspecified: Secondary | ICD-10-CM | POA: Diagnosis not present

## 2021-11-29 DIAGNOSIS — I509 Heart failure, unspecified: Secondary | ICD-10-CM | POA: Insufficient documentation

## 2021-11-29 LAB — BASIC METABOLIC PANEL
Anion gap: 8 (ref 5–15)
BUN: 64 mg/dL — ABNORMAL HIGH (ref 8–23)
CO2: 21 mmol/L — ABNORMAL LOW (ref 22–32)
Calcium: 8.6 mg/dL — ABNORMAL LOW (ref 8.9–10.3)
Chloride: 110 mmol/L (ref 98–111)
Creatinine, Ser: 3.32 mg/dL — ABNORMAL HIGH (ref 0.44–1.00)
GFR, Estimated: 14 mL/min — ABNORMAL LOW (ref >60)
Glucose, Bld: 230 mg/dL — ABNORMAL HIGH (ref 70–99)
Potassium: 4.7 mmol/L (ref 3.5–5.1)
Sodium: 139 mmol/L (ref 135–145)

## 2021-11-29 LAB — CBC WITH DIFFERENTIAL/PLATELET
Abs Immature Granulocytes: 0.05 10*3/uL (ref 0.00–0.07)
Basophils Absolute: 0 10*3/uL (ref 0.0–0.1)
Basophils Relative: 0 %
Eosinophils Absolute: 0.1 10*3/uL (ref 0.0–0.5)
Eosinophils Relative: 1 %
HCT: 28.4 % — ABNORMAL LOW (ref 36.0–46.0)
Hemoglobin: 9.2 g/dL — ABNORMAL LOW (ref 12.0–15.0)
Immature Granulocytes: 1 %
Lymphocytes Relative: 24 %
Lymphs Abs: 1.8 10*3/uL (ref 0.7–4.0)
MCH: 29.2 pg (ref 26.0–34.0)
MCHC: 32.4 g/dL (ref 30.0–36.0)
MCV: 90.2 fL (ref 80.0–100.0)
Monocytes Absolute: 0.5 10*3/uL (ref 0.1–1.0)
Monocytes Relative: 6 %
Neutro Abs: 5.2 10*3/uL (ref 1.7–7.7)
Neutrophils Relative %: 68 %
Platelets: 263 10*3/uL (ref 150–400)
RBC: 3.15 MIL/uL — ABNORMAL LOW (ref 3.87–5.11)
RDW: 14.2 % (ref 11.5–15.5)
WBC: 7.6 10*3/uL (ref 4.0–10.5)
nRBC: 0 % (ref 0.0–0.2)

## 2021-11-29 MED ORDER — METOCLOPRAMIDE HCL 5 MG/ML IJ SOLN
10.0000 mg | Freq: Once | INTRAMUSCULAR | Status: AC
  Administered 2021-11-29: 10 mg via INTRAVENOUS
  Filled 2021-11-29: qty 2

## 2021-11-29 NOTE — Discharge Instructions (Signed)
Get a carbon oxide detector to make sure that that is not a problem at your home.  You may be coming down with a cold or allergies which could have also produce this headache.  So far the lab work looks okay.  I will call you if there is any change in your kidney function.  The CAT scan today was normal. ?

## 2021-11-29 NOTE — ED Provider Notes (Signed)
?[facility] ?Provider Note ? ? ?CSN: [id number] ?Arrival date & time: 11/29/21  1059 ? ?  ? ?History ? ?Chief Complaint  ?Patient presents with  ? Headache  ? ? ?Teresa Poole is a 71 y.o. female. ? ?Patient is a 71 year old female with a history of diabetes, hypertension, CHF, carotid stenosis, hyperlipidemia, CKD, nonischemic cardiomyopathy who is presenting today with sudden onset of severe headache.  She reports that she woke up this morning and felt fine and then exactly at 9:00 when she was getting up and getting ready to do her normal daily activities she had a headache that just hit her out of nowhere.  She reports that it is severe and behind her eyes.  She reports that it made her feel weak like she might have trouble walking and feels like her vision seems blurry.  She also complains of feeling like a film over her chest but denies any shortness of breath, nausea, vomiting, photophobia.  She has no pain in the back of her head or her neck.  No unilateral numbness or weakness.  She has not had any recent congestion or infectious symptoms.  She reports she just does not have headaches.  She tried taking some Tylenol at home which she reports did not help at all. ? ?The history is provided by the patient and medical records.  ?Headache ? ?  ? ?Home Medications ?Prior to Admission medications   ?Medication Sig Start Date End Date Taking? Authorizing Provider  ?albuterol (VENTOLIN HFA) 108 (90 Base) MCG/ACT inhaler 2 puffs every 4 (four) hours as needed for wheezing or shortness of breath. 10/23/21   [provider]  ?carvedilol (COREG) 25 MG tablet Take 25 mg by mouth 2 (two) times daily with a meal.    [provider]  ?cycloSPORINE, PF, (CEQUA) 0.09 % SOLN Place 1 drop into both eyes every evening. 12/18/20   [provider]  ?ezetimibe (ZETIA) 10 MG tablet Take 10 mg by mouth daily. 06/09/21   [provider]  ?famotidine (PEPCID) 40 MG  tablet Take 40 mg by mouth daily. 09/04/21   [provider]  ?furosemide (LASIX) 20 MG tablet Take 20 mg by mouth daily. 09/17/21   [provider]  ?glucose blood (ONE TOUCH ULTRA TEST) test strip Use to check blood sugar 3 times daily Dx code E11.65 ?Patient taking differently: 1 each by Other route See admin instructions. Use to check blood sugar 3 times daily Dx code E11.65 11/20/14   Elayne Snare, MD  ?hydrOXYzine (ATARAX) 10 MG tablet Take 10 mg by mouth 3 (three) times daily as needed for anxiety. 09/17/21   [provider]  ?indapamide (LOZOL) 2.5 MG tablet Take 2.5 mg by mouth daily. 09/22/21   [provider]  ?lisinopril (ZESTRIL) 10 MG tablet Take 10 mg by mouth daily.    [provider]  ?NIFEdipine (ADALAT CC) 60 MG 24 hr tablet Take 1 tablet (60 mg total) by mouth daily. 07/09/21   Daleen Bo, MD  ?OLANZapine (ZYPREXA) 2.5 MG tablet Take 1 tablet (2.5 mg total) by mouth at bedtime for 7 days. 10/28/21 11/04/21  Darliss Cheney, MD  ?omeprazole (PRILOSEC) 40 MG capsule Take 40 mg by mouth daily. 06/08/21   [provider]  ?Roma Schanz test strip CHECK BLOOD SUGAR THREE TIMES DAILY ?Patient taking differently: 1 each by Other route in the morning, at noon, and at bedtime. 03/30/18   Elayne Snare, MD  ?OZEMPIC, 0.25  OR 0.5 MG/DOSE, 2 MG/1.5ML SOPN Inject 0.25 mg into the skin once a week. Sundays 05/12/21   [provider]  ?polyethylene glycol (MIRALAX / GLYCOLAX) 17 g packet Take 17 g by mouth daily. ?Patient not taking: Reported on 10/25/2021 09/28/21   Eugenie Filler, MD  ?rosuvastatin (CRESTOR) 20 MG tablet Take 20 mg by mouth daily.    [provider]  ?   ? ?Allergies    ?Hydralazine hcl and Atorvastatin   ? ?Review of Systems   ?Review of Systems  ?Neurological:  Positive for headaches.  ? ?Physical Exam ?Updated Vital Signs ?BP (!) 109/57 (BP Location: Right Arm)   Pulse (!) 54   Temp (!) 97.5 ?F (36.4 ?C) (Oral)   Resp 18    Wt 98.9 kg   SpO2 99%   BMI 34.14 kg/m?  ?Physical Exam ?Vitals and nursing note reviewed.  ?Constitutional:   ?   General: She is not in acute distress. ?   Appearance: Normal appearance. She is well-developed. She is not ill-appearing.  ?HENT:  ?   Head: Normocephalic and atraumatic.  ?Eyes:  ?   General: No visual field deficit. ?   Extraocular Movements: Extraocular movements intact.  ?   Conjunctiva/sclera: Conjunctivae normal.  ?   Pupils: Pupils are equal, round, and reactive to light.  ?Cardiovascular:  ?   Rate and Rhythm: Normal rate and regular rhythm.  ?   Heart sounds: Normal heart sounds. No murmur heard. ?  No friction rub.  ?Pulmonary:  ?   Effort: Pulmonary effort is normal.  ?   Breath sounds: Normal breath sounds. No wheezing or rales.  ?Abdominal:  ?   General: Bowel sounds are normal. There is no distension.  ?   Palpations: Abdomen is soft.  ?   Tenderness: There is no abdominal tenderness. There is no guarding or rebound.  ?Musculoskeletal:     ?   General: No tenderness. Normal range of motion.  ?   Cervical back: Normal range of motion and neck supple.  ?   Comments: No edema  ?Skin: ?   General: Skin is warm and dry.  ?   Findings: No rash.  ?Neurological:  ?   Mental Status: She is alert and oriented to person, place, and time.  ?   Cranial Nerves: No cranial nerve deficit, dysarthria or facial asymmetry.  ?   Sensory: Sensation is intact.  ?   Motor: Motor function is intact. No weakness or pronator drift.  ?   Coordination: Coordination is intact. Finger-Nose-Finger Test and Heel to [place] Test normal.  ?Psychiatric:     ?   Behavior: Behavior normal.  ? ? ?ED Results / Procedures / Treatments   ?Labs ?(all labs ordered are listed, but only abnormal results are displayed) ?Labs Reviewed  ?CBC WITH DIFFERENTIAL/PLATELET - Abnormal; Notable for the following components:  ?    Result Value  ? RBC 3.15 (*)   ? Hemoglobin 9.2 (*)   ? HCT 28.4 (*)   ? All other components within normal limits   ?BASIC METABOLIC PANEL - Abnormal; Notable for the following components:  ? CO2 21 (*)   ? Glucose, Bld 230 (*)   ? BUN 64 (*)   ? Creatinine, Ser 3.32 (*)   ? Calcium 8.6 (*)   ? GFR, Estimated 14 (*)   ? All other components within normal limits  ? ? ?EKG ?None ? ?Radiology ?CT Head Wo Contrast ? ?Result  Date: 11/29/2021 ?CLINICAL DATA:  Headache EXAM: CT HEAD WITHOUT CONTRAST TECHNIQUE: Contiguous axial images were obtained from the base of the skull through the vertex without intravenous contrast. RADIATION DOSE REDUCTION: This exam was performed according to the departmental dose-optimization program which includes automated exposure control, adjustment of the mA and/or kV according to patient size and/or use of iterative reconstruction technique. COMPARISON:  08/29/2021 FINDINGS: Brain: No evidence of acute infarction, hemorrhage, hydrocephalus, extra-axial collection or mass lesion/mass effect. Vascular: No hyperdense vessel or unexpected calcification. Skull: Normal. Negative for fracture or focal lesion. Sinuses/Orbits: No acute finding. Other: None. IMPRESSION: No acute abnormality of the lungs. Electronically Signed   By: Delanna Ahmadi M.D.   On: 11/29/2021 11:44   ? ?Procedures ?Procedures  ? ? ?Medications Ordered in ED ?Medications  ?metoCLOPramide (REGLAN) injection 10 mg (10 mg Intravenous Given 11/29/21 1142)  ? ? ?ED Course/ Medical Decision Making/ A&P ?  ?                        ?Medical Decision Making ?Amount and/or Complexity of Data Reviewed ?External Data Reviewed: labs and notes. ?Labs: ordered. Decision-making details documented in ED Course. ?Radiology: ordered and independent interpretation performed. Decision-making details documented in ED Course. ? ?Risk ?Prescription drug management. ? ? ?Elderly female presenting today with sudden onset of severe headache which she reports is the worst of her life.  Concern for possible subarachnoid hemorrhage.  Lower suspicion for acute  intraparenchymal bleed as patient is not hypertensive here.  She denies any trauma.  Low suspicion for infectious etiology.  Patient has not had any allergy-like symptoms.  She does not have temporal pain concerning for temp

## 2021-11-29 NOTE — ED Notes (Signed)
Pt discharged to home. Discharge instructions have been discussed with patient and/or family members. Pt verbally acknowledges understanding d/c instructions, and endorses comprehension to checkout at registration before leaving.  °

## 2021-11-29 NOTE — ED Triage Notes (Signed)
Pt reports she woke up this morning with headache. Pt reports weakness in knees and nausea. Pt took 2 tylenol ?

## 2021-12-08 ENCOUNTER — Other Ambulatory Visit: Payer: Self-pay

## 2021-12-08 ENCOUNTER — Encounter (HOSPITAL_BASED_OUTPATIENT_CLINIC_OR_DEPARTMENT_OTHER): Payer: Self-pay

## 2021-12-08 ENCOUNTER — Emergency Department (HOSPITAL_BASED_OUTPATIENT_CLINIC_OR_DEPARTMENT_OTHER)
Admission: EM | Admit: 2021-12-08 | Discharge: 2021-12-08 | Discharge status: Against Medical Advice | Payer: Medicare PPO | Attending: Emergency Medicine | Admitting: Emergency Medicine

## 2021-12-08 DIAGNOSIS — R7989 Other specified abnormal findings of blood chemistry: Secondary | ICD-10-CM | POA: Diagnosis not present

## 2021-12-08 DIAGNOSIS — R531 Weakness: Secondary | ICD-10-CM

## 2021-12-08 DIAGNOSIS — N189 Chronic kidney disease, unspecified: Secondary | ICD-10-CM | POA: Insufficient documentation

## 2021-12-08 DIAGNOSIS — Z79899 Other long term (current) drug therapy: Secondary | ICD-10-CM | POA: Diagnosis not present

## 2021-12-08 DIAGNOSIS — E1122 Type 2 diabetes mellitus with diabetic chronic kidney disease: Secondary | ICD-10-CM | POA: Insufficient documentation

## 2021-12-08 DIAGNOSIS — I129 Hypertensive chronic kidney disease with stage 1 through stage 4 chronic kidney disease, or unspecified chronic kidney disease: Secondary | ICD-10-CM | POA: Insufficient documentation

## 2021-12-08 LAB — URINALYSIS, ROUTINE W REFLEX MICROSCOPIC
Bilirubin Urine: NEGATIVE
Glucose, UA: NEGATIVE mg/dL
Hgb urine dipstick: NEGATIVE
Ketones, ur: NEGATIVE mg/dL
Nitrite: NEGATIVE
Protein, ur: 100 mg/dL — AB
Specific Gravity, Urine: 1.015 (ref 1.005–1.030)
pH: 5.5 (ref 5.0–8.0)

## 2021-12-08 LAB — URINALYSIS, MICROSCOPIC (REFLEX)

## 2021-12-08 LAB — COMPREHENSIVE METABOLIC PANEL
ALT: 14 U/L (ref 0–44)
AST: 19 U/L (ref 15–41)
Albumin: 3.8 g/dL (ref 3.5–5.0)
Alkaline Phosphatase: 52 U/L (ref 38–126)
Anion gap: 11 (ref 5–15)
BUN: 53 mg/dL — ABNORMAL HIGH (ref 8–23)
CO2: 25 mmol/L (ref 22–32)
Calcium: 9.3 mg/dL (ref 8.9–10.3)
Chloride: 104 mmol/L (ref 98–111)
Creatinine, Ser: 3.89 mg/dL — ABNORMAL HIGH (ref 0.44–1.00)
GFR, Estimated: 12 mL/min — ABNORMAL LOW (ref >60)
Glucose, Bld: 172 mg/dL — ABNORMAL HIGH (ref 70–99)
Potassium: 4.9 mmol/L (ref 3.5–5.1)
Sodium: 140 mmol/L (ref 135–145)
Total Bilirubin: 0.3 mg/dL (ref 0.3–1.2)
Total Protein: 6.9 g/dL (ref 6.5–8.1)

## 2021-12-08 LAB — CBC WITH DIFFERENTIAL/PLATELET
Abs Immature Granulocytes: 0.02 10*3/uL (ref 0.00–0.07)
Basophils Absolute: 0 10*3/uL (ref 0.0–0.1)
Basophils Relative: 1 %
Eosinophils Absolute: 0.1 10*3/uL (ref 0.0–0.5)
Eosinophils Relative: 2 %
HCT: 29.7 % — ABNORMAL LOW (ref 36.0–46.0)
Hemoglobin: 9.6 g/dL — ABNORMAL LOW (ref 12.0–15.0)
Immature Granulocytes: 0 %
Lymphocytes Relative: 27 %
Lymphs Abs: 1.7 10*3/uL (ref 0.7–4.0)
MCH: 28.4 pg (ref 26.0–34.0)
MCHC: 32.3 g/dL (ref 30.0–36.0)
MCV: 87.9 fL (ref 80.0–100.0)
Monocytes Absolute: 0.4 10*3/uL (ref 0.1–1.0)
Monocytes Relative: 6 %
Neutro Abs: 4.1 10*3/uL (ref 1.7–7.7)
Neutrophils Relative %: 64 %
Platelets: 252 10*3/uL (ref 150–400)
RBC: 3.38 MIL/uL — ABNORMAL LOW (ref 3.87–5.11)
RDW: 13.6 % (ref 11.5–15.5)
WBC: 6.3 10*3/uL (ref 4.0–10.5)
nRBC: 0 % (ref 0.0–0.2)

## 2021-12-08 LAB — CBG MONITORING, ED: Glucose-Capillary: 148 mg/dL — ABNORMAL HIGH (ref 70–99)

## 2021-12-08 NOTE — ED Triage Notes (Signed)
Pt arrives ambulatory to ED with reports of itching all over, upon entering triage pt steat that she has been feeling weak in her knees for about an hour.  ?

## 2021-12-08 NOTE — ED Notes (Signed)
Pt states she does not have to urinate at this time ?

## 2021-12-08 NOTE — ED Notes (Signed)
Patient states she is ready to leave, PA Hina Khatri at bedside to inform patient of status and requests her to stay. Pt continues to state she needs to leave to get her car worked on. Agrees to sign AMA paperwork. NAD at time of departure. States she is "feeling back to myself". ?

## 2021-12-08 NOTE — ED Provider Notes (Signed)
?[facility] ?Provider Note ? ? ?CSN: [id number] ?Arrival date & time: 12/08/21  1448 ? ?  ? ?History ? ?Chief Complaint  ?Patient presents with  ? Weakness  ? ? ?Teresa Poole is a 71 y.o. female with a past medical history of diabetes, CKD, hypertension presenting to the ED with a chief complaint of feeling "weak in my knees."  States that she woke up this morning in her usual state of health.  She was going to get her car serviced and while she was driving back she felt like her "legs were tired like I just ran a marathon."  She denies any focal weakness, changes to sensation or pain but just states that "my legs feel tired."  She may have experienced this in the past but she does not recall exactly when.  She denies any headache, vision changes, vomiting, nausea, chest pain, abdominal pain, changes to gait, injuries or falls.  States that she ate a banana for breakfast today which is typical.  No recent changes to medication, diet or lifestyle.  Denies any back pain, saddle anesthesia. ? ? ?Weakness ?Associated symptoms: no abdominal pain, no chest pain, no cough, no diarrhea, no dizziness, no dysuria, no fever, no myalgias, no nausea, no shortness of breath, no urgency and no vomiting   ? ?  ? ?Home Medications ?Prior to Admission medications   ?Medication Sig Start Date End Date Taking? Authorizing Provider  ?albuterol (VENTOLIN HFA) 108 (90 Base) MCG/ACT inhaler 2 puffs every 4 (four) hours as needed for wheezing or shortness of breath. 10/23/21   [provider]  ?carvedilol (COREG) 25 MG tablet Take 25 mg by mouth 2 (two) times daily with a meal.    [provider]  ?cycloSPORINE, PF, (CEQUA) 0.09 % SOLN Place 1 drop into both eyes every evening. 12/18/20   [provider]  ?ezetimibe (ZETIA) 10 MG tablet Take 10 mg by mouth daily. 06/09/21   [provider]  ?famotidine (PEPCID) 40 MG tablet Take 40 mg by mouth daily. 09/04/21   [provider]  ?furosemide (LASIX) 20 MG tablet Take 20 mg by mouth daily. 09/17/21   [provider]  ?glucose blood (ONE TOUCH ULTRA TEST) test strip Use to check blood sugar 3 times daily Dx code E11.65 ?Patient taking differently: 1 each by Other route See admin instructions. Use to check blood sugar 3 times daily Dx code E11.65 11/20/14   Elayne Snare, MD  ?hydrOXYzine (ATARAX) 10 MG tablet Take 10 mg by mouth 3 (three) times daily as needed for anxiety. 09/17/21   [provider]  ?indapamide (LOZOL) 2.5 MG tablet Take 2.5 mg by mouth daily. 09/22/21   [provider]  ?lisinopril (ZESTRIL) 10 MG tablet Take 10 mg by mouth daily.    [provider]  ?NIFEdipine (ADALAT CC) 60 MG 24 hr tablet Take 1 tablet (60 mg total) by mouth daily. 07/09/21   Daleen Bo, MD  ?OLANZapine (ZYPREXA) 2.5 MG tablet Take 1 tablet (2.5 mg total) by mouth at bedtime for 7 days. 10/28/21 11/04/21  Darliss Cheney, MD  ?omeprazole (PRILOSEC) 40 MG capsule Take 40 mg by mouth daily. 06/08/21   [provider]  ?Roma Schanz test strip CHECK BLOOD SUGAR THREE TIMES DAILY ?Patient taking differently: 1 each by Other route in the morning, at noon, and at bedtime. 03/30/18   Elayne Snare, MD  ?OZEMPIC, 0.25 OR 0.5 MG/DOSE, 2 MG/1.5ML SOPN Inject 0.25 mg into  the skin once a week. Sundays 05/12/21   [provider]  ?polyethylene glycol (MIRALAX / GLYCOLAX) 17 g packet Take 17 g by mouth daily. ?Patient not taking: Reported on 10/25/2021 09/28/21   Eugenie Filler, MD  ?rosuvastatin (CRESTOR) 20 MG tablet Take 20 mg by mouth daily.    [provider]  ?   ? ?Allergies    ?Hydralazine hcl and Atorvastatin   ? ?Review of Systems   ?Review of Systems  ?Constitutional:  Positive for fatigue. Negative for appetite change, chills and fever.  ?HENT:  Negative for ear pain, rhinorrhea, sneezing and sore throat.   ?Eyes:  Negative for photophobia and visual disturbance.  ?Respiratory:  Negative  for cough, chest tightness, shortness of breath and wheezing.   ?Cardiovascular:  Negative for chest pain and palpitations.  ?Gastrointestinal:  Negative for abdominal pain, blood in stool, constipation, diarrhea, nausea and vomiting.  ?Genitourinary:  Negative for dysuria, hematuria and urgency.  ?Musculoskeletal:  Negative for myalgias.  ?Skin:  Negative for rash.  ?Neurological:  Negative for dizziness, weakness and light-headedness.  ? ?Physical Exam ?Updated Vital Signs ?BP 124/61   Pulse 65   Temp 97.8 ?F (36.6 ?C) (Oral)   Resp 14   Ht '5\' 7"'$  (1.702 m)   Wt 85.3 kg   SpO2 98%   BMI 29.44 kg/m?  ?Physical Exam ?Vitals and nursing note reviewed.  ?Constitutional:   ?   General: She is not in acute distress. ?   Appearance: She is well-developed.  ?HENT:  ?   Head: Normocephalic and atraumatic.  ?   Nose: Nose normal.  ?Eyes:  ?   General: No scleral icterus.    ?   Right eye: No discharge.     ?   Left eye: No discharge.  ?   Conjunctiva/sclera: Conjunctivae normal.  ?   Pupils: Pupils are equal, round, and reactive to light.  ?Cardiovascular:  ?   Rate and Rhythm: Normal rate and regular rhythm.  ?   Heart sounds: Normal heart sounds. No murmur heard. ?  No friction rub. No gallop.  ?Pulmonary:  ?   Effort: Pulmonary effort is normal. No respiratory distress.  ?   Breath sounds: Normal breath sounds.  ?Abdominal:  ?   General: Bowel sounds are normal. There is no distension.  ?   Palpations: Abdomen is soft.  ?   Tenderness: There is no abdominal tenderness. There is no guarding.  ?Musculoskeletal:     ?   General: No tenderness. Normal range of motion.  ?   Cervical back: Normal range of motion and neck supple.  ?   Right lower leg: No edema.  ?   Left lower leg: No edema.  ?   Comments: No lower extremity edema, erythema or calf tenderness bilaterally.  2+ DP pulses noted bilaterally.  ?Skin: ?   General: Skin is warm and dry.  ?   Findings: No rash.  ?Neurological:  ?   Mental Status: She is alert and  oriented to person, place, and time.  ?   Cranial Nerves: No cranial nerve deficit.  ?   Sensory: No sensory deficit.  ?   Motor: No weakness or abnormal muscle tone.  ?   Coordination: Coordination normal.  ?   Comments: Pupils reactive. No facial asymmetry noted. Cranial nerves appear grossly intact. Sensation intact to light touch on face, BUE and BLE. Strength 5/5 in BUE and BLE. Normal speech.  ? ? ?ED  Results / Procedures / Treatments   ?Labs ?(all labs ordered are listed, but only abnormal results are displayed) ?Labs Reviewed  ?COMPREHENSIVE METABOLIC PANEL - Abnormal; Notable for the following components:  ?    Result Value  ? Glucose, Bld 172 (*)   ? BUN 53 (*)   ? Creatinine, Ser 3.89 (*)   ? GFR, Estimated 12 (*)   ? All other components within normal limits  ?CBC WITH DIFFERENTIAL/PLATELET - Abnormal; Notable for the following components:  ? RBC 3.38 (*)   ? Hemoglobin 9.6 (*)   ? HCT 29.7 (*)   ? All other components within normal limits  ?URINALYSIS, ROUTINE W REFLEX MICROSCOPIC - Abnormal; Notable for the following components:  ? Protein, ur 100 (*)   ? Leukocytes,Ua SMALL (*)   ? All other components within normal limits  ?URINALYSIS, MICROSCOPIC (REFLEX) - Abnormal; Notable for the following components:  ? Bacteria, UA RARE (*)   ? All other components within normal limits  ?CBG MONITORING, ED - Abnormal; Notable for the following components:  ? Glucose-Capillary 148 (*)   ? All other components within normal limits  ? ? ?EKG ?EKG Interpretation ? ?Date/Time:  Monday December 08 2021 09:36:52 EDT ?Ventricular Rate:  65 ?PR Interval:  196 ?QRS Duration: 161 ?QT Interval:  457 ?QTC Calculation: 476 ?R Axis:   -28 ?Text Interpretation: Sinus rhythm Right bundle branch block NO SIGNIFICANT CHANGE SINCE LAST TRACING YESTERDAY Confirmed by Lennice Sites [phone]) on 12/08/2021 9:42:37 AM ? ?Radiology ?No results found. ? ?Procedures ?Procedures  ? ? ?Medications Ordered in ED ?Medications - No data to  display ? ?ED Course/ Medical Decision Making/ A&P ?Clinical Course as of 12/08/21 1138  ?Mon Dec 08, 2021  ?0956 Glucose-Capillary(!): 148 [HK]  ?1014 Hemoglobin(!): 9.6 ?This is her baseline. [HK]  ?  ?Clinical Cou

## 2021-12-08 NOTE — ED Notes (Signed)
Pt ambulatory to restroom

## 2021-12-08 NOTE — Discharge Instructions (Signed)
Your kidney function was slightly higher than your baseline today. ?I offered additional work-up however you declined. ?You can return at any time for further work-up if your symptoms do not improve or if they worsen.  Return to the ER for chest pain, shortness of breath, uncontrollable vomiting, injuries or falls, numbness or weakness ?

## 2021-12-28 ENCOUNTER — Emergency Department (HOSPITAL_COMMUNITY)
Admission: EM | Admit: 2021-12-28 | Discharge: 2021-12-28 | Disposition: A | Discharge status: Home / Self Care | Payer: Medicare PPO | Attending: Emergency Medicine | Admitting: Emergency Medicine

## 2021-12-28 ENCOUNTER — Other Ambulatory Visit: Payer: Self-pay

## 2021-12-28 ENCOUNTER — Emergency Department (HOSPITAL_COMMUNITY): Payer: Medicare PPO

## 2021-12-28 ENCOUNTER — Encounter (HOSPITAL_COMMUNITY): Payer: Self-pay

## 2021-12-28 DIAGNOSIS — I129 Hypertensive chronic kidney disease with stage 1 through stage 4 chronic kidney disease, or unspecified chronic kidney disease: Secondary | ICD-10-CM | POA: Diagnosis not present

## 2021-12-28 DIAGNOSIS — R5383 Other fatigue: Secondary | ICD-10-CM | POA: Insufficient documentation

## 2021-12-28 DIAGNOSIS — R531 Weakness: Secondary | ICD-10-CM | POA: Diagnosis not present

## 2021-12-28 DIAGNOSIS — E1122 Type 2 diabetes mellitus with diabetic chronic kidney disease: Secondary | ICD-10-CM | POA: Insufficient documentation

## 2021-12-28 DIAGNOSIS — N189 Chronic kidney disease, unspecified: Secondary | ICD-10-CM | POA: Insufficient documentation

## 2021-12-28 DIAGNOSIS — I2510 Atherosclerotic heart disease of native coronary artery without angina pectoris: Secondary | ICD-10-CM | POA: Diagnosis not present

## 2021-12-28 DIAGNOSIS — Z79899 Other long term (current) drug therapy: Secondary | ICD-10-CM | POA: Diagnosis not present

## 2021-12-28 LAB — URINALYSIS, ROUTINE W REFLEX MICROSCOPIC
Bacteria, UA: NONE SEEN
Bilirubin Urine: NEGATIVE
Glucose, UA: NEGATIVE mg/dL
Hgb urine dipstick: NEGATIVE
Ketones, ur: NEGATIVE mg/dL
Nitrite: NEGATIVE
Protein, ur: 30 mg/dL — AB
Specific Gravity, Urine: 1.004 — ABNORMAL LOW (ref 1.005–1.030)
pH: 5 (ref 5.0–8.0)

## 2021-12-28 LAB — CBC WITH DIFFERENTIAL/PLATELET
Abs Immature Granulocytes: 0.01 10*3/uL (ref 0.00–0.07)
Basophils Absolute: 0 10*3/uL (ref 0.0–0.1)
Basophils Relative: 1 %
Eosinophils Absolute: 0.1 10*3/uL (ref 0.0–0.5)
Eosinophils Relative: 2 %
HCT: 29.3 % — ABNORMAL LOW (ref 36.0–46.0)
Hemoglobin: 9.7 g/dL — ABNORMAL LOW (ref 12.0–15.0)
Immature Granulocytes: 0 %
Lymphocytes Relative: 40 %
Lymphs Abs: 2.3 10*3/uL (ref 0.7–4.0)
MCH: 29 pg (ref 26.0–34.0)
MCHC: 33.1 g/dL (ref 30.0–36.0)
MCV: 87.7 fL (ref 80.0–100.0)
Monocytes Absolute: 0.4 10*3/uL (ref 0.1–1.0)
Monocytes Relative: 7 %
Neutro Abs: 3 10*3/uL (ref 1.7–7.7)
Neutrophils Relative %: 50 %
Platelets: 219 10*3/uL (ref 150–400)
RBC: 3.34 MIL/uL — ABNORMAL LOW (ref 3.87–5.11)
RDW: 13.5 % (ref 11.5–15.5)
WBC: 5.8 10*3/uL (ref 4.0–10.5)
nRBC: 0 % (ref 0.0–0.2)

## 2021-12-28 LAB — BASIC METABOLIC PANEL
Anion gap: 11 (ref 5–15)
BUN: 57 mg/dL — ABNORMAL HIGH (ref 8–23)
CO2: 23 mmol/L (ref 22–32)
Calcium: 9.5 mg/dL (ref 8.9–10.3)
Chloride: 102 mmol/L (ref 98–111)
Creatinine, Ser: 4.19 mg/dL — ABNORMAL HIGH (ref 0.44–1.00)
GFR, Estimated: 11 mL/min — ABNORMAL LOW (ref >60)
Glucose, Bld: 88 mg/dL (ref 70–99)
Potassium: 4.1 mmol/L (ref 3.5–5.1)
Sodium: 136 mmol/L (ref 135–145)

## 2021-12-28 LAB — CBG MONITORING, ED: Glucose-Capillary: 90 mg/dL (ref 70–99)

## 2021-12-28 MED ORDER — SODIUM CHLORIDE 0.9 % IV BOLUS
1000.0000 mL | Freq: Once | INTRAVENOUS | Status: AC
Start: 2021-12-28 — End: 2021-12-28
  Administered 2021-12-28: 1000 mL via INTRAVENOUS

## 2021-12-28 MED ORDER — SODIUM CHLORIDE 0.9 % IV BOLUS: 1000.0000 mL | Freq: Once | INTRAVENOUS | Status: DC

## 2021-12-28 NOTE — ED Notes (Signed)
Entered the room to start fluids and pt jumped up and started climbing out of bed c/o cramping in bilat legs that radiated to her hips. Provider made aware and at bedside at this time. Was able to get pt back into bed and get fluids started on pt. Pt advised to not try and climb out of bed like that or she would remove cables and they could possibly choke her. Pt verbalized understanding ?

## 2021-12-28 NOTE — ED Notes (Signed)
Pt reporting flank pain, increased urination, weakness, lightheadedness, mild chest pressure, muscle fatigue x5 days. ? ?

## 2021-12-28 NOTE — ED Triage Notes (Signed)
Pt arrives to ED ambulatory with c/o generalized weakness. Pt reports today she was at church and was sitting down for a long period of time and when she started walking she felt like her muscles was weakness in her buttock area. Pt reports she is a type 2 diabetic and is on ozempic and thinks for the past 2 weeks her pen is not working correctly and she felt her BS was running high. Pt bs in triage was 90. Pt is a&OX4. No neuro deficits. Pt denies any cp& sob.  ?

## 2021-12-28 NOTE — ED Provider Notes (Signed)
?[facility] ?Provider Note ? ? ?CSN: [id number] ?Arrival date & time: 12/28/21  1904 ? ?  ? ?History ? ?Chief Complaint  ?Patient presents with  ? Weakness  ? ? ?Teresa Poole is a 71 y.o. female. ? ?Patient here with fatigue.  Patient states that she was worried about her blood sugar.  Did some walking at the park today and has been a little bit sore in her low back since then.  She states that she has had 2 unusually long urines but does not have any pain with urination or burning with urination.  Mostly she was worried that maybe she was coming down with some sort of cold and lives by herself and was concerned.  She denies any chest pain or shortness of breath.  Denies any fevers or chills.  She feels pretty comfortable currently.  Denies any nausea or vomiting.  History of diabetes, hypertension, diabetes, CKD.  She states that she is having a lot of cramping in her legs and low back. ? ?The history is provided by the patient.  ? ?  ? ?Home Medications ?Prior to Admission medications   ?Medication Sig Start Date End Date Taking? Authorizing Provider  ?albuterol (VENTOLIN HFA) 108 (90 Base) MCG/ACT inhaler 2 puffs every 4 (four) hours as needed for wheezing or shortness of breath. 10/23/21   [provider]  ?carvedilol (COREG) 25 MG tablet Take 25 mg by mouth 2 (two) times daily with a meal.    [provider]  ?cycloSPORINE, PF, (CEQUA) 0.09 % SOLN Place 1 drop into both eyes every evening. 12/18/20   [provider]  ?ezetimibe (ZETIA) 10 MG tablet Take 10 mg by mouth daily. 06/09/21   [provider]  ?famotidine (PEPCID) 40 MG tablet Take 40 mg by mouth daily. 09/04/21   [provider]  ?furosemide (LASIX) 20 MG tablet Take 20 mg by mouth daily. 09/17/21   [provider]  ?glucose blood (ONE TOUCH ULTRA TEST) test strip Use to check blood sugar 3 times daily Dx code E11.65 ?Patient taking differently: 1 each by Other  route See admin instructions. Use to check blood sugar 3 times daily Dx code E11.65 11/20/14   Elayne Snare, MD  ?hydrOXYzine (ATARAX) 10 MG tablet Take 10 mg by mouth 3 (three) times daily as needed for anxiety. 09/17/21   [provider]  ?indapamide (LOZOL) 2.5 MG tablet Take 2.5 mg by mouth daily. 09/22/21   [provider]  ?lisinopril (ZESTRIL) 10 MG tablet Take 10 mg by mouth daily.    [provider]  ?NIFEdipine (ADALAT CC) 60 MG 24 hr tablet Take 1 tablet (60 mg total) by mouth daily. 07/09/21   Daleen Bo, MD  ?OLANZapine (ZYPREXA) 2.5 MG tablet Take 1 tablet (2.5 mg total) by mouth at bedtime for 7 days. 10/28/21 11/04/21  Darliss Cheney, MD  ?omeprazole (PRILOSEC) 40 MG capsule Take 40 mg by mouth daily. 06/08/21   [provider]  ?Roma Schanz test strip CHECK BLOOD SUGAR THREE TIMES DAILY ?Patient taking differently: 1 each by Other route in the morning, at noon, and at bedtime. 03/30/18   Elayne Snare, MD  ?OZEMPIC, 0.25 OR 0.5 MG/DOSE, 2 MG/1.5ML SOPN Inject 0.25 mg into the skin once a week. Sundays 05/12/21   [provider]  ?polyethylene glycol (MIRALAX / GLYCOLAX) 17 g packet Take 17 g by mouth daily. ?Patient not taking: Reported on 10/25/2021 09/28/21   Eugenie Filler,  MD  ?rosuvastatin (CRESTOR) 20 MG tablet Take 20 mg by mouth daily.    [provider]  ?   ? ?Allergies    ?Hydralazine hcl and Atorvastatin   ? ?Review of Systems   ?Review of Systems ? ?Physical Exam ?Updated Vital Signs ?BP (!) 143/72   Pulse 73   Temp 98.6 ?F (37 ?C) (Oral)   Resp 17   Ht '5\' 7"'$  (1.702 m)   Wt 85.3 kg   SpO2 100%   BMI 29.44 kg/m?  ?Physical Exam ?Vitals and nursing note reviewed.  ?Constitutional:   ?   General: She is not in acute distress. ?   Appearance: She is well-developed. She is not ill-appearing.  ?HENT:  ?   Head: Normocephalic and atraumatic.  ?   Nose: Nose normal.  ?   Mouth/Throat:  ?   Mouth: Mucous membranes are moist.  ?Eyes:  ?    Extraocular Movements: Extraocular movements intact.  ?   Conjunctiva/sclera: Conjunctivae normal.  ?   Pupils: Pupils are equal, round, and reactive to light.  ?Cardiovascular:  ?   Rate and Rhythm: Normal rate and regular rhythm.  ?   Pulses: Normal pulses.  ?   Heart sounds: Normal heart sounds. No murmur heard. ?Pulmonary:  ?   Effort: Pulmonary effort is normal. No respiratory distress.  ?   Breath sounds: Normal breath sounds.  ?Abdominal:  ?   Palpations: Abdomen is soft.  ?   Tenderness: There is no abdominal tenderness.  ?Musculoskeletal:     ?   General: No swelling.  ?   Cervical back: Normal range of motion and neck supple.  ?Skin: ?   General: Skin is warm and dry.  ?   Capillary Refill: Capillary refill takes less than 2 seconds.  ?Neurological:  ?   General: No focal deficit present.  ?   Mental Status: She is alert and oriented to person, place, and time.  ?   Cranial Nerves: No cranial nerve deficit.  ?   Sensory: No sensory deficit.  ?   Motor: No weakness.  ?   Coordination: Coordination normal.  ?   Comments: 5+ out of 5 strength throughout, normal sensation, no drift, normal finger-nose-finger, normal speech  ?Psychiatric:     ?   Mood and Affect: Mood normal.  ? ? ?ED Results / Procedures / Treatments   ?Labs ?(all labs ordered are listed, but only abnormal results are displayed) ?Labs Reviewed  ?CBC WITH DIFFERENTIAL/PLATELET - Abnormal; Notable for the following components:  ?    Result Value  ? RBC 3.34 (*)   ? Hemoglobin 9.7 (*)   ? HCT 29.3 (*)   ? All other components within normal limits  ?BASIC METABOLIC PANEL - Abnormal; Notable for the following components:  ? BUN 57 (*)   ? Creatinine, Ser 4.19 (*)   ? GFR, Estimated 11 (*)   ? All other components within normal limits  ?URINALYSIS, ROUTINE W REFLEX MICROSCOPIC - Abnormal; Notable for the following components:  ? Color, Urine STRAW (*)   ? Specific Gravity, Urine 1.004 (*)   ? Protein, ur 30 (*)   ? Leukocytes,Ua SMALL (*)   ? All  other components within normal limits  ?CBG MONITORING, ED  ? ? ?EKG ?EKG Interpretation ? ?Date/Time:  Sunday Dec 28 2021 20:02:04 EDT ?Ventricular Rate:  68 ?PR Interval:  211 ?QRS Duration: 182 ?QT Interval:  474 ?QTC Calculation: 505 ?R Axis:  12 ?Text Interpretation: Sinus rhythm Right bundle branch block Probable left ventricular hypertrophy Confirmed by Ronnald Nian, Clarissia Mckeen ([id number]) on 12/28/2021 8:04:23 PM ? ?Radiology ?DG Chest Portable 1 View ? ?Result Date: 12/28/2021 ?CLINICAL DATA:  Chest pain. EXAM: PORTABLE CHEST 1 VIEW COMPARISON:  10/25/2021 and prior studies FINDINGS: The cardiomediastinal silhouette is unremarkable. Elevation of the RIGHT hemidiaphragm again noted. There is no evidence of focal airspace disease, pulmonary edema, suspicious pulmonary nodule/mass, pleural effusion, or pneumothorax. No acute bony abnormalities are identified. IMPRESSION: No active disease. Electronically Signed   By: Margarette Canada M.D.   On: 12/28/2021 20:06   ? ?Procedures ?Procedures  ? ? ?Medications Ordered in ED ?Medications  ?sodium chloride 0.9 % bolus 1,000 mL (1,000 mLs Intravenous New Bag/Given 12/28/21 2126)  ? ? ?ED Course/ Medical Decision Making/ A&P ?  ?                        ?Medical Decision Making ?Amount and/or Complexity of Data Reviewed ?Labs: ordered. ?Radiology: ordered. ? ? ?Teresa Poole is here with generalized weakness.  Concerned that her blood sugar was elevated.  History of diabetes, hypertension, CKD, cardiomyopathy, mild CAD.  Denies any chest pain or shortness of breath.  But a little bit weaker today but did go to church and did go to the park.  She denies any abdominal pain.  Denies any pain with urination but she states that she had to long urine episodes.  Made her feel like her sugar was elevated.  Patient denies any stroke symptoms.  She has no weakness or numbness or speech changes.  Overall she appears very well.  She is unremarkable vitals.  No fever.  Blood sugar upon arrival is  normal at 83.  Overall we will evaluate for anemia, electrolyte abnormality, urine infection, chest infection.  Will get EKG.  I have no suspicion for ACS or any acute cardiac or pulmonary issue. ? ?Per my review and

## 2021-12-28 NOTE — Discharge Instructions (Signed)
Follow-up with nephrology, number provided.  Suspect your chronic kidney disease is slowly gotten worse.  Important for you to talk with them about this.  Recommend that you follow-up with your primary care doctor this week to have your kidney function rechecked and have them continue to follow until you see your nephrologist. ?

## 2021-12-28 NOTE — ED Notes (Signed)
C/o weakness at this time. States she feels like her muscle catration. C/o lt side throat pain ?

## 2022-01-07 ENCOUNTER — Encounter (HOSPITAL_BASED_OUTPATIENT_CLINIC_OR_DEPARTMENT_OTHER): Payer: Self-pay | Admitting: Emergency Medicine

## 2022-01-07 ENCOUNTER — Emergency Department (HOSPITAL_BASED_OUTPATIENT_CLINIC_OR_DEPARTMENT_OTHER)
Admission: EM | Admit: 2022-01-07 | Discharge: 2022-01-07 | Disposition: A | Discharge status: Home / Self Care | Payer: Medicare PPO | Attending: Emergency Medicine | Admitting: Emergency Medicine

## 2022-01-07 ENCOUNTER — Other Ambulatory Visit: Payer: Self-pay

## 2022-01-07 DIAGNOSIS — E119 Type 2 diabetes mellitus without complications: Secondary | ICD-10-CM | POA: Diagnosis not present

## 2022-01-07 DIAGNOSIS — L299 Pruritus, unspecified: Secondary | ICD-10-CM | POA: Insufficient documentation

## 2022-01-07 MED ORDER — DEXAMETHASONE 4 MG PO TABS
10.0000 mg | ORAL_TABLET | Freq: Once | ORAL | Status: AC
  Administered 2022-01-07: 10 mg via ORAL
  Filled 2022-01-07: qty 3

## 2022-01-07 NOTE — ED Provider Notes (Signed)
?[facility] EMERGENCY DEPARTMENT ?Provider Note ? ? ?CSN: [id number] ?Arrival date & time: 01/07/22  0934 ? ?  ? ?History ? ?Chief Complaint  ?Patient presents with  ? Pruritis  ? ? ?Teresa Poole is a 71 y.o. female. ? ?Patient woke up itchy all over.  No rash.  No respiratory symptoms.  Nothing has made it worse or better.  Does not know of any exposures.  No new medications.  Overall is comfortable.  No chest pain or shortness of breath.  History of heart failure, diabetes ? ? ? ?  ? ?Home Medications ?Prior to Admission medications   ?Medication Sig Start Date End Date Taking? Authorizing Provider  ?albuterol (VENTOLIN HFA) 108 (90 Base) MCG/ACT inhaler 2 puffs every 4 (four) hours as needed for wheezing or shortness of breath. 10/23/21   [provider]  ?carvedilol (COREG) 25 MG tablet Take 25 mg by mouth 2 (two) times daily with a meal.    [provider]  ?cycloSPORINE, PF, (CEQUA) 0.09 % SOLN Place 1 drop into both eyes every evening. 12/18/20   [provider]  ?ezetimibe (ZETIA) 10 MG tablet Take 10 mg by mouth daily. 06/09/21   [provider]  ?famotidine (PEPCID) 40 MG tablet Take 40 mg by mouth daily. 09/04/21   [provider]  ?furosemide (LASIX) 20 MG tablet Take 20 mg by mouth daily. 09/17/21   [provider]  ?glucose blood (ONE TOUCH ULTRA TEST) test strip Use to check blood sugar 3 times daily Dx code E11.65 ?Patient taking differently: 1 each by Other route See admin instructions. Use to check blood sugar 3 times daily Dx code E11.65 11/20/14   Elayne Snare, MD  ?hydrOXYzine (ATARAX) 10 MG tablet Take 10 mg by mouth 3 (three) times daily as needed for anxiety. 09/17/21   [provider]  ?indapamide (LOZOL) 2.5 MG tablet Take 2.5 mg by mouth daily. 09/22/21   [provider]  ?lisinopril (ZESTRIL) 10 MG tablet Take 10 mg by mouth daily.    [provider]  ?NIFEdipine (ADALAT CC) 60 MG 24 hr tablet Take 1  tablet (60 mg total) by mouth daily. 07/09/21   Daleen Bo, MD  ?OLANZapine (ZYPREXA) 2.5 MG tablet Take 1 tablet (2.5 mg total) by mouth at bedtime for 7 days. 10/28/21 11/04/21  Darliss Cheney, MD  ?omeprazole (PRILOSEC) 40 MG capsule Take 40 mg by mouth daily. 06/08/21   [provider]  ?Roma Schanz test strip CHECK BLOOD SUGAR THREE TIMES DAILY ?Patient taking differently: 1 each by Other route in the morning, at noon, and at bedtime. 03/30/18   Elayne Snare, MD  ?OZEMPIC, 0.25 OR 0.5 MG/DOSE, 2 MG/1.5ML SOPN Inject 0.25 mg into the skin once a week. Sundays 05/12/21   [provider]  ?polyethylene glycol (MIRALAX / GLYCOLAX) 17 g packet Take 17 g by mouth daily. ?Patient not taking: Reported on 10/25/2021 09/28/21   Eugenie Filler, MD  ?rosuvastatin (CRESTOR) 20 MG tablet Take 20 mg by mouth daily.    [provider]  ?   ? ?Allergies    ?Hydralazine hcl and Atorvastatin   ? ?Review of Systems   ?Review of Systems ? ?Physical Exam ?Updated Vital Signs ?There were no vitals taken for this visit. ?Physical Exam ?Vitals and nursing note reviewed.  ?Constitutional:   ?   General: She is not in acute distress. ?   Appearance: She is well-developed.  ?HENT:  ?   Head:  Normocephalic and atraumatic.  ?Eyes:  ?   Conjunctiva/sclera: Conjunctivae normal.  ?Cardiovascular:  ?   Rate and Rhythm: Normal rate and regular rhythm.  ?   Heart sounds: No murmur heard. ?Pulmonary:  ?   Effort: Pulmonary effort is normal. No respiratory distress.  ?   Breath sounds: Normal breath sounds.  ?Abdominal:  ?   Palpations: Abdomen is soft.  ?   Tenderness: There is no abdominal tenderness.  ?Musculoskeletal:     ?   General: No swelling.  ?   Cervical back: Neck supple.  ?Skin: ?   General: Skin is warm and dry.  ?   Capillary Refill: Capillary refill takes less than 2 seconds.  ?   Findings: No rash.  ?Neurological:  ?   Mental Status: She is alert.  ?Psychiatric:     ?   Mood and Affect: Mood normal.   ? ? ?ED Results / Procedures / Treatments   ?Labs ?(all labs ordered are listed, but only abnormal results are displayed) ?Labs Reviewed - No data to display ? ?EKG ?None ? ?Radiology ?No results found. ? ?Procedures ?Procedures  ? ? ?Medications Ordered in ED ?Medications  ?dexamethasone (DECADRON) tablet 10 mg (has no administration in time range)  ? ? ?ED Course/ Medical Decision Making/ A&P ?  ?                        ?Medical Decision Making ?Risk ?Prescription drug management. ? ? ?Teresa Poole is here with itchiness.  Overall appears well.  Nothing has made it worse or better.  No obvious exposure to allergen.  She has no signs of anaphylaxis or respiratory distress.  Clear breath sounds.  Normal vitals.  History of diabetes.  She appears calm.  Itchy and this is not as intense now.  We will give a dose of Decadron.  Understands return precautions.  Discharged in good condition.  Recommend follow-up primary care doctor if persistent. ? ?This chart was dictated using voice recognition software.  Despite best efforts to proofread,  errors can occur which can change the documentation meaning.  ? ? ? ? ? ? ? ?Final Clinical Impression(s) / ED Diagnoses ?Final diagnoses:  ?Itching  ? ? ?Rx / DC Orders ?ED Discharge Orders   ? ? None  ? ?  ? ? ?  ?Lennice Sites, DO ?01/07/22 1029 ? ?

## 2022-01-07 NOTE — ED Triage Notes (Addendum)
Pt states  woke up itching all over , states no rash she look ,  no particular place is worse than the other she states ,  no n/v/sob , pt aaox4 ,no acute distress, denies any new shampoos , lotions or clothes ?

## 2022-02-20 ENCOUNTER — Emergency Department (HOSPITAL_COMMUNITY)
Admission: EM | Admit: 2022-02-20 | Discharge: 2022-02-21 | Disposition: A | Discharge status: Home / Self Care | Payer: Medicare PPO | Attending: Emergency Medicine | Admitting: Emergency Medicine

## 2022-02-20 ENCOUNTER — Encounter (HOSPITAL_COMMUNITY): Payer: Self-pay

## 2022-02-20 ENCOUNTER — Emergency Department (HOSPITAL_COMMUNITY): Payer: Medicare PPO

## 2022-02-20 ENCOUNTER — Other Ambulatory Visit: Payer: Self-pay

## 2022-02-20 DIAGNOSIS — I130 Hypertensive heart and chronic kidney disease with heart failure and stage 1 through stage 4 chronic kidney disease, or unspecified chronic kidney disease: Secondary | ICD-10-CM | POA: Diagnosis not present

## 2022-02-20 DIAGNOSIS — I5030 Unspecified diastolic (congestive) heart failure: Secondary | ICD-10-CM | POA: Insufficient documentation

## 2022-02-20 DIAGNOSIS — R4182 Altered mental status, unspecified: Secondary | ICD-10-CM | POA: Diagnosis not present

## 2022-02-20 DIAGNOSIS — N184 Chronic kidney disease, stage 4 (severe): Secondary | ICD-10-CM | POA: Diagnosis not present

## 2022-02-20 DIAGNOSIS — Z79899 Other long term (current) drug therapy: Secondary | ICD-10-CM | POA: Diagnosis not present

## 2022-02-20 DIAGNOSIS — R011 Cardiac murmur, unspecified: Secondary | ICD-10-CM | POA: Insufficient documentation

## 2022-02-20 DIAGNOSIS — E1165 Type 2 diabetes mellitus with hyperglycemia: Secondary | ICD-10-CM | POA: Diagnosis not present

## 2022-02-20 DIAGNOSIS — D649 Anemia, unspecified: Secondary | ICD-10-CM | POA: Diagnosis not present

## 2022-02-20 DIAGNOSIS — R0789 Other chest pain: Secondary | ICD-10-CM | POA: Insufficient documentation

## 2022-02-20 DIAGNOSIS — Z794 Long term (current) use of insulin: Secondary | ICD-10-CM | POA: Insufficient documentation

## 2022-02-20 DIAGNOSIS — R0602 Shortness of breath: Secondary | ICD-10-CM | POA: Diagnosis not present

## 2022-02-20 DIAGNOSIS — E1122 Type 2 diabetes mellitus with diabetic chronic kidney disease: Secondary | ICD-10-CM | POA: Diagnosis not present

## 2022-02-20 LAB — BASIC METABOLIC PANEL
Anion gap: 7 (ref 5–15)
BUN: 42 mg/dL — ABNORMAL HIGH (ref 8–23)
CO2: 24 mmol/L (ref 22–32)
Calcium: 9 mg/dL (ref 8.9–10.3)
Chloride: 113 mmol/L — ABNORMAL HIGH (ref 98–111)
Creatinine, Ser: 2.75 mg/dL — ABNORMAL HIGH (ref 0.44–1.00)
GFR, Estimated: 18 mL/min — ABNORMAL LOW (ref >60)
Glucose, Bld: 125 mg/dL — ABNORMAL HIGH (ref 70–99)
Potassium: 4.5 mmol/L (ref 3.5–5.1)
Sodium: 144 mmol/L (ref 135–145)

## 2022-02-20 LAB — CBC
HCT: 29.5 % — ABNORMAL LOW (ref 36.0–46.0)
Hemoglobin: 9.6 g/dL — ABNORMAL LOW (ref 12.0–15.0)
MCH: 28.9 pg (ref 26.0–34.0)
MCHC: 32.5 g/dL (ref 30.0–36.0)
MCV: 88.9 fL (ref 80.0–100.0)
Platelets: 220 10*3/uL (ref 150–400)
RBC: 3.32 MIL/uL — ABNORMAL LOW (ref 3.87–5.11)
RDW: 13.7 % (ref 11.5–15.5)
WBC: 5.3 10*3/uL (ref 4.0–10.5)
nRBC: 0 % (ref 0.0–0.2)

## 2022-02-20 LAB — TROPONIN I (HIGH SENSITIVITY): Troponin I (High Sensitivity): 9 ng/L (ref <18)

## 2022-02-20 LAB — CBG MONITORING, ED: Glucose-Capillary: 127 mg/dL — ABNORMAL HIGH (ref 70–99)

## 2022-02-20 NOTE — ED Triage Notes (Signed)
Pt reports she went to have lunch at [facility] in [place] and all of sudden she realized that 3 1/2 hours had gone by. Pt reports not remembering anything that happened within the 3 1/2 hours. Pt now endorses SHOB and chest pressure. A&O x4.

## 2022-02-20 NOTE — ED Provider Triage Note (Signed)
Emergency Medicine Provider Triage Evaluation Note  Teresa Poole , a 71 y.o. female  was evaluated in triage.  Pt complains of altered mental status.  Patient states that after leaving a restaurant today, she stepped down in her car.  She then woke up about 3 hours later and was unsure what it happened.  She states that she sat in her car for about an hour and then drove to the emergency department after she felt it was safe.  She states that recently in the morning she has been waking up with shortness of breath.  Otherwise no current complaints.  She is unsure if she fell asleep, passed out, or exactly what had happened to her. Patient denies signs of stroke including: facial droop, slurred speech, aphasia, weakness/numbness in extremities, imbalance/trouble walking.   Review of Systems  Positive: SOB Negative: headache  Physical Exam  BP (!) 144/72 (BP Location: Left Arm)   Pulse 74   Temp 98.6 F (37 C) (Oral)   Resp 11   SpO2 94%  Gen:   Awake, no distress   Resp:  Normal effort  MSK:   Moves extremities without difficulty  Other:  Heart normal rate and rhythm, gross neuro exam unremarkable  Medical Decision Making  Medically screening exam initiated at 5:31 PM.  Appropriate orders placed.  OKLA QAZI was informed that the remainder of the evaluation will be completed by another provider, this initial triage assessment does not replace that evaluation, and the importance of remaining in the ED until their evaluation is complete.     Carlisle Cater, PA-C 02/20/22 1733

## 2022-02-21 LAB — TROPONIN I (HIGH SENSITIVITY): Troponin I (High Sensitivity): 14 ng/L (ref <18)

## 2022-02-21 NOTE — ED Provider Notes (Incomplete)
[facility] DEPT Provider Note   CSN: [id number] Arrival date & time: 02/20/22  1642     History {Add pertinent medical, surgical, social history, OB history to HPI:1} Chief Complaint  Patient presents with  . Shortness of Breath    Teresa Poole is a 71 y.o. female.  HPI  Patient with medical history including diabetes, hypertension, CKD stage IV, nonischemic cardiomyopathy diastolic heart failure 55 to 60% EF presents emerged department complaints of hearing her heartbeat.  Patient states that while she was in the car she is consistent hear her heartbeat, she states she hears it in her ears, states sounds just like a normal beating heart, she denies any actual chest pain but feels chest pressure in the middle of her chest, this is remained constant, no associated lightheaded dizziness near syncope no becoming diaphoretic nausea or vomiting, she denies any shortness of breath.  She states that she generally hears her heart but this is louder than usual, she has no other complaints.  She denies any ear pain lightheaded dizziness headaches change in vision paresthesia or weakness of her lower extremities.  She has no other complaints.    Home Medications Prior to Admission medications   Medication Sig Start Date End Date Taking? Authorizing Provider  albuterol (VENTOLIN HFA) 108 (90 Base) MCG/ACT inhaler 2 puffs every 4 (four) hours as needed for wheezing or shortness of breath. 10/23/21   [provider]  carvedilol (COREG) 25 MG tablet Take 25 mg by mouth 2 (two) times daily with a meal.    [provider]  cycloSPORINE, PF, (CEQUA) 0.09 % SOLN Place 1 drop into both eyes every evening. 12/18/20   [provider]  ezetimibe (ZETIA) 10 MG tablet Take 10 mg by mouth daily. 06/09/21   [provider]  famotidine (PEPCID) 40 MG tablet Take 40 mg by mouth daily. 09/04/21   [provider]  furosemide (LASIX) 20 MG  tablet Take 20 mg by mouth daily. 09/17/21   [provider]  glucose blood (ONE TOUCH ULTRA TEST) test strip Use to check blood sugar 3 times daily Dx code E11.65 Patient taking differently: 1 each by Other route See admin instructions. 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 09/17/21   [provider]  indapamide (LOZOL) 2.5 MG tablet Take 2.5 mg by mouth daily. 09/22/21   [provider]  lisinopril (ZESTRIL) 10 MG tablet Take 10 mg by mouth daily.    [provider]  NIFEdipine (ADALAT CC) 60 MG 24 hr tablet Take 1 tablet (60 mg total) by mouth daily. 07/09/21   Daleen Bo, MD  OLANZapine (ZYPREXA) 2.5 MG tablet Take 1 tablet (2.5 mg total) by mouth at bedtime for 7 days. 10/28/21 11/04/21  Darliss Cheney, MD  omeprazole (PRILOSEC) 40 MG capsule Take 40 mg by mouth daily. 06/08/21   [provider]  ONETOUCH VERIO test strip [place] Patient taking differently: 1 each by Other route in the morning, at noon, and at bedtime. 03/30/18   Elayne Snare, MD  OZEMPIC, 0.25 OR 0.5 MG/DOSE, 2 MG/1.5ML SOPN Inject 0.25 mg into the skin once a week. Sundays 05/12/21   [provider]  polyethylene glycol (MIRALAX / GLYCOLAX) 17 g packet Take 17 g by mouth daily. Patient not  taking: Reported on 10/25/2021 09/28/21   Eugenie Filler, MD  rosuvastatin (CRESTOR) 20 MG tablet Take 20 mg by mouth daily.    [provider]      Allergies    Hydralazine hcl and Atorvastatin    Review of Systems   Review of Systems  Physical Exam Updated Vital Signs BP (!) 148/85   Pulse 77   Temp 98.6 F (37 C) (Oral)   Resp 18   SpO2 99%  Physical Exam  ED Results / Procedures / Treatments   Labs (all labs ordered are listed, but only abnormal results are displayed) Labs Reviewed  BASIC  METABOLIC PANEL - Abnormal; Notable for the following components:      Result Value   Chloride 113 (*)    Glucose, Bld 125 (*)    BUN 42 (*)    Creatinine, Ser 2.75 (*)    GFR, Estimated 18 (*)    All other components within normal limits  CBC - Abnormal; Notable for the following components:   RBC 3.32 (*)    Hemoglobin 9.6 (*)    HCT 29.5 (*)    All other components within normal limits  CBG MONITORING, ED - Abnormal; Notable for the following components:   Glucose-Capillary 127 (*)    All other components within normal limits  TROPONIN I (HIGH SENSITIVITY)  TROPONIN I (HIGH SENSITIVITY)    EKG None  Radiology CT HEAD WO CONTRAST (5MM)  Result Date: 02/20/2022 CLINICAL DATA:  Mental status change, unknown cause Syncope/presyncope, cerebrovascular cause suspected EXAM: CT HEAD WITHOUT CONTRAST TECHNIQUE: Contiguous axial images were obtained from the base of the skull through the vertex without intravenous contrast. RADIATION DOSE REDUCTION: This exam was performed according to the departmental dose-optimization program which includes automated exposure control, adjustment of the mA and/or kV according to patient size and/or use of iterative reconstruction technique. COMPARISON:  Head CT 11/29/2021, brain MRI 08/29/2021 FINDINGS: Brain: No intracranial hemorrhage, mass effect, or midline shift. Normal brain volume for age. No hydrocephalus. The basilar cisterns are patent. No evidence of territorial infarct or acute ischemia. Similar degree of minimal chronic small vessel ischemia. 5No extra-axial or intracranial fluid collection. Vascular: Atherosclerosis of skullbase vasculature without hyperdense vessel or abnormal calcification. Skull: No fracture or focal lesion. Sinuses/Orbits: Paranasal sinuses and mastoid air cells are clear. The visualized orbits are unremarkable. Other: None. IMPRESSION: No acute intracranial abnormality. Unremarkable noncontrast head CT for age. Electronically  Signed   By: Keith Rake M.D.   On: 02/20/2022 18:11   DG Chest 2 View  Result Date: 02/20/2022 CLINICAL DATA:  Shortness of breath and chest pressure EXAM: CHEST - 2 VIEW COMPARISON:  Chest x-ray dated Dec 28, 2021 FINDINGS: The heart size and mediastinal contours are within normal limits. Both lungs are clear. The visualized skeletal structures are unremarkable. IMPRESSION: No active cardiopulmonary disease. Electronically Signed   By: Yetta Glassman M.D.   On: 02/20/2022 17:20    Procedures Procedures  {Document cardiac monitor, telemetry assessment procedure when appropriate:1}  Medications Ordered in ED Medications - No data to display  ED Course/ Medical Decision Making/ A&P                           Medical Decision Making  ***  {Document critical care time when appropriate:1} {Document review of labs and clinical decision tools ie heart score, Chads2Vasc2 etc:1}  {Document your independent review of radiology images, and any outside  records:1} {Document your discussion with family members, caretakers, and with consultants:1} {Document social determinants of health affecting pt's care:1} {Document your decision making why or why not admission, treatments were needed:1} Final Clinical Impression(s) / ED Diagnoses Final diagnoses:  None    Rx / DC Orders ED Discharge Orders     None

## 2022-02-21 NOTE — ED Provider Notes (Signed)
[facility] DEPT Provider Note   CSN: [id number] Arrival date & time: 02/20/22  1642     History  Chief Complaint  Patient presents with   Shortness of Breath    Teresa Poole is a 71 y.o. female.  HPI  Patient with medical history including diabetes, hypertension, CKD stage IV, nonischemic cardiomyopathy diastolic heart failure 55 to 60% EF presents with complaints of hearing her heartbeat.  Patient states that while she was in the car she could hear her heartbeat, she states  states sounds just like a normal beating heart, she denies any actual chest pain but feels chest pressure in the middle of her chest, this is remained constant, no associated lightheaded dizziness near syncope no becoming diaphoretic nausea or vomiting, she denies any shortness of breath.  She states that she generally hears her heart but this is louder than usual, she has no other complaints.  She denies any ear pain change in vision paresthesia or weakness of her lower extremities.  She has no other complaints.    Home Medications Prior to Admission medications   Medication Sig Start Date End Date Taking? Authorizing Provider  albuterol (VENTOLIN HFA) 108 (90 Base) MCG/ACT inhaler 2 puffs every 4 (four) hours as needed for wheezing or shortness of breath. 10/23/21   [provider]  carvedilol (COREG) 25 MG tablet Take 25 mg by mouth 2 (two) times daily with a meal.    [provider]  cycloSPORINE, PF, (CEQUA) 0.09 % SOLN Place 1 drop into both eyes every evening. 12/18/20   [provider]  ezetimibe (ZETIA) 10 MG tablet Take 10 mg by mouth daily. 06/09/21   [provider]  famotidine (PEPCID) 40 MG tablet Take 40 mg by mouth daily. 09/04/21   [provider]  furosemide (LASIX) 20 MG tablet Take 20 mg by mouth daily. 09/17/21   [provider]  glucose blood (ONE TOUCH ULTRA TEST) test strip Use to check blood sugar 3  times daily Dx code E11.65 Patient taking differently: 1 each by Other route See admin instructions. 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 09/17/21   [provider]  indapamide (LOZOL) 2.5 MG tablet Take 2.5 mg by mouth daily. 09/22/21   [provider]  lisinopril (ZESTRIL) 10 MG tablet Take 10 mg by mouth daily.    [provider]  NIFEdipine (ADALAT CC) 60 MG 24 hr tablet Take 1 tablet (60 mg total) by mouth daily. 07/09/21   Daleen Bo, MD  OLANZapine (ZYPREXA) 2.5 MG tablet Take 1 tablet (2.5 mg total) by mouth at bedtime for 7 days. 10/28/21 11/04/21  Darliss Cheney, MD  omeprazole (PRILOSEC) 40 MG capsule Take 40 mg by mouth daily. 06/08/21   [provider]  ONETOUCH VERIO test strip [place] Patient taking differently: 1 each by Other route in the morning, at noon, and at bedtime. 03/30/18   Elayne Snare, MD  OZEMPIC, 0.25 OR 0.5 MG/DOSE, 2 MG/1.5ML SOPN Inject 0.25 mg into the skin once a week. Sundays 05/12/21   [provider]  polyethylene glycol (MIRALAX / GLYCOLAX) 17 g packet Take 17 g by mouth daily. Patient not taking: Reported on 10/25/2021 09/28/21   Eugenie Filler, MD  rosuvastatin (CRESTOR) 20 MG tablet Take 20  mg by mouth daily.    [provider]      Allergies    Hydralazine hcl and Atorvastatin    Review of Systems   Review of Systems  Constitutional:  Negative for chills and fever.  Respiratory:  Negative for shortness of breath.   Cardiovascular:  Positive for chest pain.  Gastrointestinal:  Negative for abdominal pain, diarrhea, nausea and vomiting.  Neurological:  Negative for headaches.    Physical Exam Updated Vital Signs BP (!) 146/76   Pulse 85   Temp 98.6 F (37 C) (Oral)   Resp 18   SpO2 99%  Physical Exam Vitals and nursing note  reviewed.  Constitutional:      General: She is not in acute distress.    Appearance: She is not ill-appearing.  HENT:     Head: Normocephalic and atraumatic.     Nose: No congestion.  Eyes:     Conjunctiva/sclera: Conjunctivae normal.  Neck:     Vascular: No carotid bruit.  Cardiovascular:     Rate and Rhythm: Normal rate and regular rhythm.     Pulses: Normal pulses.     Heart sounds: Murmur heard.     No friction rub. No gallop.     Comments: Diastolic heart murmur heard Pulmonary:     Effort: No respiratory distress.     Breath sounds: No wheezing, rhonchi or rales.  Abdominal:     Palpations: Abdomen is soft.     Tenderness: There is no abdominal tenderness. There is no right CVA tenderness or left CVA tenderness.  Musculoskeletal:     Right lower leg: No edema.     Left lower leg: No edema.  Skin:    General: Skin is warm and dry.  Neurological:     Mental Status: She is alert.     Comments: No facial asymmetry no difficulty with word finding following two-step commands no unilateral weakness present.  Alert and orient x4.  Psychiatric:        Mood and Affect: Mood normal.     ED Results / Procedures / Treatments   Labs (all labs ordered are listed, but only abnormal results are displayed) Labs Reviewed  BASIC METABOLIC PANEL - Abnormal; Notable for the following components:      Result Value   Chloride 113 (*)    Glucose, Bld 125 (*)    BUN 42 (*)    Creatinine, Ser 2.75 (*)    GFR, Estimated 18 (*)    All other components within normal limits  CBC - Abnormal; Notable for the following components:   RBC 3.32 (*)    Hemoglobin 9.6 (*)    HCT 29.5 (*)    All other components within normal limits  CBG MONITORING, ED - Abnormal; Notable for the following components:   Glucose-Capillary 127 (*)    All other components within normal limits  TROPONIN I (HIGH SENSITIVITY)  TROPONIN I (HIGH SENSITIVITY)    EKG EKG Interpretation  Date/Time:  Friday February 20 2022 16:56:21 EDT Ventricular Rate:  73 PR Interval:  193 QRS Duration: 161 QT Interval:  401 QTC Calculation: 442 R Axis:   12 Text Interpretation: Sinus rhythm Right bundle branch block Confirmed by Gerlene Fee [phone]) on 02/21/2022 12:08:46 AM  Radiology CT HEAD WO CONTRAST (5MM)  Result Date: 02/20/2022 CLINICAL DATA:  Mental status change, unknown cause Syncope/presyncope, cerebrovascular cause suspected EXAM: CT HEAD WITHOUT CONTRAST TECHNIQUE: Contiguous axial images were obtained from the base of  the skull through the vertex without intravenous contrast. RADIATION DOSE REDUCTION: This exam was performed according to the departmental dose-optimization program which includes automated exposure control, adjustment of the mA and/or kV according to patient size and/or use of iterative reconstruction technique. COMPARISON:  Head CT 11/29/2021, brain MRI 08/29/2021 FINDINGS: Brain: No intracranial hemorrhage, mass effect, or midline shift. Normal brain volume for age. No hydrocephalus. The basilar cisterns are patent. No evidence of territorial infarct or acute ischemia. Similar degree of minimal chronic small vessel ischemia. 5No extra-axial or intracranial fluid collection. Vascular: Atherosclerosis of skullbase vasculature without hyperdense vessel or abnormal calcification. Skull: No fracture or focal lesion. Sinuses/Orbits: Paranasal sinuses and mastoid air cells are clear. The visualized orbits are unremarkable. Other: None. IMPRESSION: No acute intracranial abnormality. Unremarkable noncontrast head CT for age. Electronically Signed   By: Keith Rake M.D.   On: 02/20/2022 18:11   DG Chest 2 View  Result Date: 02/20/2022 CLINICAL DATA:  Shortness of breath and chest pressure EXAM: CHEST - 2 VIEW COMPARISON:  Chest x-ray dated Dec 28, 2021 FINDINGS: The heart size and mediastinal contours are within normal limits. Both lungs are clear. The visualized skeletal structures are unremarkable.  IMPRESSION: No active cardiopulmonary disease. Electronically Signed   By: Yetta Glassman M.D.   On: 02/20/2022 17:20    Procedures Procedures    Medications Ordered in ED Medications - No data to display  ED Course/ Medical Decision Making/ A&P                           Medical Decision Making Amount and/or Complexity of Data Reviewed Labs: ordered. Radiology: ordered.   This patient presents to the ED for concern of chest pressure, this involves an extensive number of treatment options, and is a complaint that carries with it a high risk of complications and morbidity.  The differential diagnosis includes PE, ACS, dissection    Additional history obtained:  Additional history obtained from N/A External records from outside source obtained and reviewed including cardiology notes   Co morbidities that complicate the patient evaluation  Diabetes, cardiomyopathy  Social Determinants of Health:  Geriatric    Lab Tests:  I Ordered, and personally interpreted labs.  The pertinent results include: CBC shows normocytic anemia 9.6, BMP shows chloride 113 glucose 125 BUN 42 creatinine 2.75 at baseline GFR 18, for troponin is 9, second opponent is 14   Imaging Studies ordered:  I ordered imaging studies including CT head as well as chest x-ray I independently visualized and interpreted imaging which showed both negative acute findings I agree with the radiologist interpretation   Cardiac Monitoring:  The patient was maintained on a cardiac monitor.  I personally viewed and interpreted the cardiac monitored which showed an underlying rhythm of: Unremarkable   Medicines ordered and prescription drug management:  I ordered medication including N/A I have reviewed the patients home medicines and have made adjustments as needed  Critical Interventions:  N/A   Reevaluation:  Presents with chest pressure, triage obtain lab work imaging which I personally reviewed  they are unremarkable, will await second troponin and reassess.  Reassessed resting comfortably having no complaints she is agreement with plan discharge at this time.    Consultations Obtained:  N/A    Test Considered:  N/A    Rule out I have low suspicion for ACS as history is atypical, EKG was sinus rhythm without signs of ischemia, patient had negative delta  troponin.  Low suspicion for PE as patient denies pleuritic chest pain, shortness of breath, patient denies leg pain, no pedal edema noted on exam, vital signs reassuring nontachypneic nonhypoxic nontachycardic.  Low suspicion for AAA or aortic dissection as history is atypical, patient has low risk factors.  Low suspicion for carotid stenosis no syncope, no carotid bruits present my exam.  Low suspicion for carotid dissection presentation atypical, no neck pain.  low suspicion for systemic infection as patient is nontoxic-appearing, vital signs reassuring, no obvious source infection noted on exam.     Dispostion and problem list  After consideration of the diagnostic results and the patients response to treatment, I feel that the patent would benefit from discharge.  Chest pressure-unclear etiology, possible this is more GERD related but due to her cardiac history we will have her follow-up with her cardiologist for further evaluation and strict return precautions.            Final Clinical Impression(s) / ED Diagnoses Final diagnoses:  Chest pressure    Rx / DC Orders ED Discharge Orders     None         Marcello Fennel, PA-C 02/21/22 0245    Maudie Flakes, MD 02/21/22 [phone]

## 2022-02-21 NOTE — Discharge Instructions (Signed)
Lab work imaging all reassuring please continue all home medications  Follow-up with cardiologist as needed.  Come back to the emergency department if you develop chest pain, shortness of breath, severe abdominal pain, uncontrolled nausea, vomiting, diarrhea.

## 2022-02-25 ENCOUNTER — Encounter (HOSPITAL_COMMUNITY): Payer: Self-pay | Admitting: Emergency Medicine

## 2022-02-25 ENCOUNTER — Emergency Department (HOSPITAL_COMMUNITY): Payer: Medicare PPO

## 2022-02-25 ENCOUNTER — Emergency Department (HOSPITAL_COMMUNITY)
Admission: EM | Admit: 2022-02-25 | Discharge: 2022-02-25 | Disposition: A | Discharge status: Home / Self Care | Payer: Medicare PPO | Attending: Emergency Medicine | Admitting: Emergency Medicine

## 2022-02-25 DIAGNOSIS — Z79899 Other long term (current) drug therapy: Secondary | ICD-10-CM | POA: Insufficient documentation

## 2022-02-25 DIAGNOSIS — R0789 Other chest pain: Secondary | ICD-10-CM | POA: Insufficient documentation

## 2022-02-25 DIAGNOSIS — I10 Essential (primary) hypertension: Secondary | ICD-10-CM

## 2022-02-25 DIAGNOSIS — R410 Disorientation, unspecified: Secondary | ICD-10-CM | POA: Insufficient documentation

## 2022-02-25 DIAGNOSIS — N189 Chronic kidney disease, unspecified: Secondary | ICD-10-CM | POA: Insufficient documentation

## 2022-02-25 DIAGNOSIS — Z794 Long term (current) use of insulin: Secondary | ICD-10-CM | POA: Diagnosis not present

## 2022-02-25 DIAGNOSIS — I129 Hypertensive chronic kidney disease with stage 1 through stage 4 chronic kidney disease, or unspecified chronic kidney disease: Secondary | ICD-10-CM | POA: Insufficient documentation

## 2022-02-25 LAB — RAPID URINE DRUG SCREEN, HOSP PERFORMED
Amphetamines: NOT DETECTED
Barbiturates: NOT DETECTED
Benzodiazepines: NOT DETECTED
Cocaine: NOT DETECTED
Opiates: NOT DETECTED
Tetrahydrocannabinol: NOT DETECTED

## 2022-02-25 LAB — COMPREHENSIVE METABOLIC PANEL
ALT: 13 U/L (ref 0–44)
AST: 19 U/L (ref 15–41)
Albumin: 3.6 g/dL (ref 3.5–5.0)
Alkaline Phosphatase: 46 U/L (ref 38–126)
Anion gap: 8 (ref 5–15)
BUN: 40 mg/dL — ABNORMAL HIGH (ref 8–23)
CO2: 26 mmol/L (ref 22–32)
Calcium: 9.2 mg/dL (ref 8.9–10.3)
Chloride: 106 mmol/L (ref 98–111)
Creatinine, Ser: 3 mg/dL — ABNORMAL HIGH (ref 0.44–1.00)
GFR, Estimated: 16 mL/min — ABNORMAL LOW (ref >60)
Glucose, Bld: 122 mg/dL — ABNORMAL HIGH (ref 70–99)
Potassium: 5.1 mmol/L (ref 3.5–5.1)
Sodium: 140 mmol/L (ref 135–145)
Total Bilirubin: 0.5 mg/dL (ref 0.3–1.2)
Total Protein: 6.6 g/dL (ref 6.5–8.1)

## 2022-02-25 LAB — URINALYSIS, ROUTINE W REFLEX MICROSCOPIC
Bacteria, UA: NONE SEEN
Bilirubin Urine: NEGATIVE
Glucose, UA: NEGATIVE mg/dL
Hgb urine dipstick: NEGATIVE
Ketones, ur: NEGATIVE mg/dL
Nitrite: NEGATIVE
Protein, ur: 100 mg/dL — AB
Specific Gravity, Urine: 1.011 (ref 1.005–1.030)
pH: 6 (ref 5.0–8.0)

## 2022-02-25 LAB — CBC WITH DIFFERENTIAL/PLATELET
Abs Immature Granulocytes: 0.02 10*3/uL (ref 0.00–0.07)
Basophils Absolute: 0 10*3/uL (ref 0.0–0.1)
Basophils Relative: 0 %
Eosinophils Absolute: 0.1 10*3/uL (ref 0.0–0.5)
Eosinophils Relative: 2 %
HCT: 31.6 % — ABNORMAL LOW (ref 36.0–46.0)
Hemoglobin: 10.1 g/dL — ABNORMAL LOW (ref 12.0–15.0)
Immature Granulocytes: 0 %
Lymphocytes Relative: 26 %
Lymphs Abs: 1.4 10*3/uL (ref 0.7–4.0)
MCH: 28.5 pg (ref 26.0–34.0)
MCHC: 32 g/dL (ref 30.0–36.0)
MCV: 89.3 fL (ref 80.0–100.0)
Monocytes Absolute: 0.4 10*3/uL (ref 0.1–1.0)
Monocytes Relative: 7 %
Neutro Abs: 3.3 10*3/uL (ref 1.7–7.7)
Neutrophils Relative %: 65 %
Platelets: 252 10*3/uL (ref 150–400)
RBC: 3.54 MIL/uL — ABNORMAL LOW (ref 3.87–5.11)
RDW: 13.6 % (ref 11.5–15.5)
WBC: 5.2 10*3/uL (ref 4.0–10.5)
nRBC: 0 % (ref 0.0–0.2)

## 2022-02-25 LAB — AMMONIA: Ammonia: 18 umol/L (ref 9–35)

## 2022-02-25 LAB — LIPASE, BLOOD: Lipase: 42 U/L (ref 11–51)

## 2022-02-25 LAB — ETHANOL: Alcohol, Ethyl (B): <10 mg/dL (ref <10)

## 2022-02-25 LAB — TROPONIN I (HIGH SENSITIVITY)
Troponin I (High Sensitivity): 21 ng/L — ABNORMAL HIGH (ref <18)
Troponin I (High Sensitivity): 23 ng/L — ABNORMAL HIGH (ref <18)

## 2022-02-25 LAB — CBG MONITORING, ED: Glucose-Capillary: 104 mg/dL — ABNORMAL HIGH (ref 70–99)

## 2022-02-25 MED ORDER — LABETALOL HCL 5 MG/ML IV SOLN
10.0000 mg | Freq: Once | INTRAVENOUS | Status: AC
  Administered 2022-02-25: 10 mg via INTRAVENOUS
  Filled 2022-02-25: qty 4

## 2022-02-25 MED ORDER — ACETAMINOPHEN 325 MG PO TABS
650.0000 mg | ORAL_TABLET | Freq: Once | ORAL | Status: AC
  Administered 2022-02-25: 650 mg via ORAL
  Filled 2022-02-25: qty 2

## 2022-02-25 NOTE — ED Provider Triage Note (Signed)
Emergency Medicine Provider Triage Evaluation Note  Teresa Poole , a 71 y.o. female  was evaluated in triage.  Pt complains of burning sensation throughout her entire body.  Feels like there is acid throughout her body.  Also reporting chest pressure.  All symptoms began today after she ate a biscuit from [employer].  Denies any vomiting.  Review of Systems  Positive: Burning sensation, chest pressure Negative: Vomiting  Physical Exam  BP (!) 220/90 (BP Location: Left Arm)   Pulse 75   Temp 98.1 F (36.7 C) (Oral)   Resp 16   SpO2 100%  Gen:   Awake, no distress   Resp:  Normal effort  MSK:   Moves extremities without difficulty  Other:    Medical Decision Making  Medically screening exam initiated at 3:20 PM.  Appropriate orders placed.  Teresa Poole was informed that the remainder of the evaluation will be completed by another provider, this initial triage assessment does not replace that evaluation, and the importance of remaining in the ED until their evaluation is complete.  Work-up initiated   Delia Heady, PA-C 02/25/22 1521

## 2022-02-25 NOTE — ED Provider Notes (Signed)
[facility] EMERGENCY DEPARTMENT Provider Note   CSN: [id number] Arrival date & time: 02/25/22  1502     History  Chief Complaint  Patient presents with   Abdominal Pain    Teresa Poole is a 71 y.o. female who was sent in from urgent care for elevated blood pressure and tract chest pressure.  The patient appears to be confused and is having difficulty relaying her history.  She appears to have repetitive h. therefore history is unreliable.  According the patient she took her medications this morning but was not feeling well and felt like she had pressure on her chest.  She went to an urgent care and was noted to have hypertension and was brought to the emergency department for further evaluation.  The patient was given sublingual nitroglycerin and aspirin prior to arrival.  She still feels like someone "is holding a hand on my chest."  Patient is oriented to person and place.  She is disoriented to time.   Abdominal Pain      Home Medications Prior to Admission medications   Medication Sig Start Date End Date Taking? Authorizing Provider  albuterol (VENTOLIN HFA) 108 (90 Base) MCG/ACT inhaler Inhale 2 puffs into the lungs every 4 (four) hours as needed for wheezing or shortness of breath. 10/23/21  Yes [provider]  carvedilol (COREG) 25 MG tablet Take 25 mg by mouth 2 (two) times daily with a meal.   Yes [provider]  ezetimibe (ZETIA) 10 MG tablet Take 10 mg by mouth daily. 06/09/21  Yes [provider]  famotidine (PEPCID) 40 MG tablet Take 40 mg by mouth daily. 09/04/21  Yes [provider]  fluticasone (FLONASE) 50 MCG/ACT nasal spray Place 2 sprays into both nostrils daily.   Yes [provider]  indapamide (LOZOL) 2.5 MG tablet Take 2.5 mg by mouth daily. 09/22/21  Yes [provider]  lisinopril (ZESTRIL) 10 MG tablet Take 10 mg by mouth daily.   Yes [provider]  NIFEdipine (ADALAT CC)  60 MG 24 hr tablet Take 1 tablet (60 mg total) by mouth daily. 07/09/21  Yes Daleen Bo, MD  omeprazole (PRILOSEC) 20 MG capsule Take 20 mg by mouth daily.   Yes [provider]  OZEMPIC, 0.25 OR 0.5 MG/DOSE, 2 MG/1.5ML SOPN Inject 0.25 mg into the skin once a week. Sundays 05/12/21  Yes [provider]  rosuvastatin (CRESTOR) 20 MG tablet Take 20 mg by mouth daily.   Yes [provider]  cycloSPORINE, PF, (CEQUA) 0.09 % SOLN Place 1 drop into both eyes every evening. Patient not taking: Reported on 02/25/2022 12/18/20   [provider]  glucose blood (ONE TOUCH ULTRA TEST) test strip Use to check blood sugar 3 times daily Dx code E11.65 Patient taking differently: 1 each by Other route See admin instructions. Use to check blood sugar 3 times daily Dx code E11.65 11/20/14   Elayne Snare, MD  OLANZapine (ZYPREXA) 2.5 MG tablet Take 1 tablet (2.5 mg total) by mouth at bedtime for 7 days. Patient not taking: Reported on 02/25/2022 10/28/21 11/04/21  Darliss Cheney, MD  [place] test strip [place] Patient taking differently: 1 each by Other route in the morning, at noon, and at bedtime. 03/30/18   Elayne Snare, MD  polyethylene glycol (MIRALAX / GLYCOLAX) 17 g packet Take 17 g by mouth daily. Patient not taking: Reported on 10/25/2021 09/28/21   Eugenie Filler, MD  Allergies    Hydralazine hcl and Atorvastatin    Review of Systems   Review of Systems  Gastrointestinal:  Positive for abdominal pain.    Physical Exam Updated Vital Signs BP (!) 186/69   Pulse 68   Temp 98.4 F (36.9 C) (Oral)   Resp 12   SpO2 100%  Physical Exam Vitals and nursing note reviewed.  Constitutional:      General: She is not in acute distress.    Appearance: She is well-developed. She is not diaphoretic.  HENT:     Head: Normocephalic and atraumatic.     Right Ear: External ear normal.     Left Ear: External ear normal.     Nose: Nose normal.      Mouth/Throat:     Mouth: Mucous membranes are moist.  Eyes:     General: No scleral icterus.    Extraocular Movements: Extraocular movements intact.     Conjunctiva/sclera: Conjunctivae normal.     Pupils: Pupils are equal, round, and reactive to light.  Cardiovascular:     Rate and Rhythm: Normal rate and regular rhythm.     Heart sounds: Normal heart sounds. No murmur heard.    No friction rub. No gallop.  Pulmonary:     Effort: Pulmonary effort is normal. No respiratory distress.     Breath sounds: Normal breath sounds.  Abdominal:     General: Bowel sounds are normal. There is no distension.     Palpations: Abdomen is soft. There is no mass.     Tenderness: There is no abdominal tenderness. There is no guarding.  Musculoskeletal:     Cervical back: Normal range of motion.  Skin:    General: Skin is warm and dry.  Neurological:     Mental Status: She is alert. She is disoriented.  Psychiatric:        Behavior: Behavior normal.     Comments: Bizarre affect     ED Results / Procedures / Treatments   Labs (all labs ordered are listed, but only abnormal results are displayed) Labs Reviewed  COMPREHENSIVE METABOLIC PANEL - Abnormal; Notable for the following components:      Result Value   Glucose, Bld 122 (*)    BUN 40 (*)    Creatinine, Ser 3.00 (*)    GFR, Estimated 16 (*)    All other components within normal limits  CBC WITH DIFFERENTIAL/PLATELET - Abnormal; Notable for the following components:   RBC 3.54 (*)    Hemoglobin 10.1 (*)    HCT 31.6 (*)    All other components within normal limits  URINALYSIS, ROUTINE W REFLEX MICROSCOPIC - Abnormal; Notable for the following components:   Color, Urine STRAW (*)    Protein, ur 100 (*)    Leukocytes,Ua TRACE (*)    All other components within normal limits  CBG MONITORING, ED - Abnormal; Notable for the following components:   Glucose-Capillary 104 (*)    All other components within normal limits  TROPONIN I  (HIGH SENSITIVITY) - Abnormal; Notable for the following components:   Troponin I (High Sensitivity) 21 (*)    All other components within normal limits  TROPONIN I (HIGH SENSITIVITY) - Abnormal; Notable for the following components:   Troponin I (High Sensitivity) 23 (*)    All other components within normal limits  LIPASE, BLOOD  AMMONIA  ETHANOL  RAPID URINE DRUG SCREEN, HOSP PERFORMED    EKG EKG Interpretation  Date/Time:  Wednesday February 25 2022  19:13:22 EDT Ventricular Rate:  71 PR Interval:  228 QRS Duration: 167 QT Interval:  442 QTC Calculation: 481 R Axis:   -12 Text Interpretation: Sinus rhythm Prolonged PR interval Right bundle branch block Probable left ventricular hypertrophy No significant change since last tracing Confirmed by Isla Pence [phone]) on 02/25/2022 7:58:08 PM  Radiology CT Head Wo Contrast  Result Date: 02/25/2022 CLINICAL DATA:  Altered mental status EXAM: CT HEAD WITHOUT CONTRAST TECHNIQUE: Contiguous axial images were obtained from the base of the skull through the vertex without intravenous contrast. RADIATION DOSE REDUCTION: This exam was performed according to the departmental dose-optimization program which includes automated exposure control, adjustment of the mA and/or kV according to patient size and/or use of iterative reconstruction technique. COMPARISON:  02/20/2022 FINDINGS: Brain: No evidence of acute infarction, hemorrhage, hydrocephalus, extra-axial collection or mass lesion/mass effect. Mild chronic white matter ischemic changes are noted. Mild atrophic changes are seen. Vascular: No hyperdense vessel or unexpected calcification. Skull: Normal. Negative for fracture or focal lesion. Sinuses/Orbits: No acute finding. Other: None. IMPRESSION: Chronic atrophic and ischemic changes without acute abnormality. Electronically Signed   By: Inez Catalina M.D.   On: 02/25/2022 22:26   DG Chest 2 View  Result Date: 02/25/2022 CLINICAL DATA:  Chest pain.  EXAM: CHEST - 2 VIEW COMPARISON:  Chest radiograph dated 02/20/2022. FINDINGS: The heart size is normal. Vascular calcifications are seen in the aortic arch. Both lungs are clear. Degenerative changes are seen in the spine. IMPRESSION: No active cardiopulmonary disease. Aortic Atherosclerosis (ICD10-I70.0). Electronically Signed   By: Zerita Boers M.D.   On: 02/25/2022 15:59    Procedures Procedures    Medications Ordered in ED Medications  labetalol (NORMODYNE) injection 10 mg (10 mg Intravenous Given 02/25/22 2126)  acetaminophen (TYLENOL) tablet 650 mg (650 mg Oral Given 02/25/22 2258)    ED Course/ Medical Decision Making/ A&P Clinical Course as of 02/25/22 2323  Wed Feb 25, 2022  2240 I reached out to the patient's sister via telephone, Hassell Done.  She states that the patient has been having memory issues, dramatic affect and talking about herself in third person for over a year and that this is not new behavior for her. [AH]    Clinical Course User Index [AH] Margarita Mail, PA-C                           Medical Decision Making 71 year old female who presents emergency department with chest pressure and hypertension.The emergent differential diagnosis of chest pain includes: Acute coronary syndrome, pericarditis, aortic dissection, pulmonary embolism, tension pneumothorax, pneumonia, and esophageal rupture.  I ordered and reviewed labs that included ethanol, ammonia, UDS all negative, CMP with chronic kidney disease, mildly elevated blood glucose, CBC without acute finding, lipase within normal limits.  Mildly elevated troponins at 21, and 23 respectively are not markedly elevated and I do not believe reflect ACS. I suspect's mild troponin leak secondary to her hypertension which has been effectively improved after labetalol.  I ordered and reviewed CT head which shows no acute findings.  As documented in ED course I spoke with the patient's sister who states that she is at her  baseline mental status.  EKG does not show signs of acute ischemia.  Personally reviewed the chest x-ray and interpreted images which shows no acute findings.   Here for total time of 8 hours in the emergency department with out significant decompensation.  She is hemodynamically stable, chest  pressure free.  I have given her an ambulatory referral to cardiology for further work-up of her discomfort this evening.  Seen and shared visit with Dr. Gilford Raid.  Patient appears appropriate for discharge at this time.  Amount and/or Complexity of Data Reviewed Labs: ordered. Radiology: ordered and independent interpretation performed.    Details: As discussed and medical decision making ECG/medicine tests: ordered and independent interpretation performed.    Details: EKG shows sinus rhythm at a rate of 71  Risk OTC drugs. Prescription drug management.           Final Clinical Impression(s) / ED Diagnoses Final diagnoses:  Accelerated hypertension  Chronic renal impairment, unspecified CKD stage  Chest discomfort    Rx / DC Orders ED Discharge Orders          Ordered    Ambulatory referral to Cardiology       Comments: If you have not heard from the Cardiology office within the next 72 hours please call [phone].   02/25/22 2246              Margarita Mail, PA-C 02/25/22 2324    Isla Pence, MD 02/26/22 1510

## 2022-02-25 NOTE — Discharge Instructions (Addendum)
Get help right away if you: Develop a severe headache or confusion. Have unusual weakness or numbness. Feel faint. Have severe pain in your chest or abdomen. Vomit repeatedly. Have trouble breathing.

## 2022-02-25 NOTE — ED Triage Notes (Signed)
Patient BIB [facility] from [facility] with complaint of itching over her entire body and "peppery saliva". Received '324mg'$  ASA and 1x SL NTG from [facility] urgent care.

## 2022-02-28 ENCOUNTER — Emergency Department (HOSPITAL_COMMUNITY): Payer: Medicare PPO

## 2022-02-28 ENCOUNTER — Observation Stay (HOSPITAL_COMMUNITY)
Admission: EM | Admit: 2022-02-28 | Discharge: 2022-03-01 | Disposition: A | Discharge status: Home / Self Care | Payer: Medicare PPO | Attending: Internal Medicine | Admitting: Internal Medicine

## 2022-02-28 ENCOUNTER — Encounter (HOSPITAL_COMMUNITY): Payer: Self-pay

## 2022-02-28 ENCOUNTER — Emergency Department (HOSPITAL_BASED_OUTPATIENT_CLINIC_OR_DEPARTMENT_OTHER): Payer: Medicare PPO

## 2022-02-28 ENCOUNTER — Other Ambulatory Visit: Payer: Self-pay

## 2022-02-28 DIAGNOSIS — E1122 Type 2 diabetes mellitus with diabetic chronic kidney disease: Secondary | ICD-10-CM | POA: Diagnosis present

## 2022-02-28 DIAGNOSIS — Z8543 Personal history of malignant neoplasm of ovary: Secondary | ICD-10-CM | POA: Insufficient documentation

## 2022-02-28 DIAGNOSIS — M7989 Other specified soft tissue disorders: Secondary | ICD-10-CM

## 2022-02-28 DIAGNOSIS — Z95828 Presence of other vascular implants and grafts: Secondary | ICD-10-CM | POA: Diagnosis not present

## 2022-02-28 DIAGNOSIS — I10 Essential (primary) hypertension: Secondary | ICD-10-CM | POA: Diagnosis present

## 2022-02-28 DIAGNOSIS — I959 Hypotension, unspecified: Secondary | ICD-10-CM | POA: Diagnosis not present

## 2022-02-28 DIAGNOSIS — D631 Anemia in chronic kidney disease: Secondary | ICD-10-CM | POA: Insufficient documentation

## 2022-02-28 DIAGNOSIS — D638 Anemia in other chronic diseases classified elsewhere: Secondary | ICD-10-CM

## 2022-02-28 DIAGNOSIS — Z79899 Other long term (current) drug therapy: Secondary | ICD-10-CM | POA: Insufficient documentation

## 2022-02-28 DIAGNOSIS — I152 Hypertension secondary to endocrine disorders: Secondary | ICD-10-CM | POA: Diagnosis present

## 2022-02-28 DIAGNOSIS — R2242 Localized swelling, mass and lump, left lower limb: Secondary | ICD-10-CM | POA: Insufficient documentation

## 2022-02-28 DIAGNOSIS — E1169 Type 2 diabetes mellitus with other specified complication: Secondary | ICD-10-CM | POA: Diagnosis not present

## 2022-02-28 DIAGNOSIS — Z8616 Personal history of COVID-19: Secondary | ICD-10-CM | POA: Diagnosis not present

## 2022-02-28 DIAGNOSIS — I2510 Atherosclerotic heart disease of native coronary artery without angina pectoris: Secondary | ICD-10-CM | POA: Insufficient documentation

## 2022-02-28 DIAGNOSIS — R079 Chest pain, unspecified: Secondary | ICD-10-CM

## 2022-02-28 DIAGNOSIS — N189 Chronic kidney disease, unspecified: Secondary | ICD-10-CM

## 2022-02-28 DIAGNOSIS — I130 Hypertensive heart and chronic kidney disease with heart failure and stage 1 through stage 4 chronic kidney disease, or unspecified chronic kidney disease: Secondary | ICD-10-CM | POA: Diagnosis not present

## 2022-02-28 DIAGNOSIS — E785 Hyperlipidemia, unspecified: Secondary | ICD-10-CM | POA: Diagnosis present

## 2022-02-28 DIAGNOSIS — N184 Chronic kidney disease, stage 4 (severe): Secondary | ICD-10-CM | POA: Diagnosis not present

## 2022-02-28 DIAGNOSIS — I5042 Chronic combined systolic (congestive) and diastolic (congestive) heart failure: Secondary | ICD-10-CM | POA: Diagnosis not present

## 2022-02-28 DIAGNOSIS — N179 Acute kidney failure, unspecified: Secondary | ICD-10-CM | POA: Diagnosis present

## 2022-02-28 DIAGNOSIS — R0789 Other chest pain: Principal | ICD-10-CM | POA: Diagnosis present

## 2022-02-28 DIAGNOSIS — I5030 Unspecified diastolic (congestive) heart failure: Secondary | ICD-10-CM | POA: Diagnosis present

## 2022-02-28 LAB — CBC WITH DIFFERENTIAL/PLATELET
Abs Immature Granulocytes: 0.02 10*3/uL (ref 0.00–0.07)
Basophils Absolute: 0 10*3/uL (ref 0.0–0.1)
Basophils Relative: 1 %
Eosinophils Absolute: 0.1 10*3/uL (ref 0.0–0.5)
Eosinophils Relative: 3 %
HCT: 26.7 % — ABNORMAL LOW (ref 36.0–46.0)
Hemoglobin: 8.6 g/dL — ABNORMAL LOW (ref 12.0–15.0)
Immature Granulocytes: 0 %
Lymphocytes Relative: 36 %
Lymphs Abs: 2 10*3/uL (ref 0.7–4.0)
MCH: 28.8 pg (ref 26.0–34.0)
MCHC: 32.2 g/dL (ref 30.0–36.0)
MCV: 89.3 fL (ref 80.0–100.0)
Monocytes Absolute: 0.5 10*3/uL (ref 0.1–1.0)
Monocytes Relative: 8 %
Neutro Abs: 2.9 10*3/uL (ref 1.7–7.7)
Neutrophils Relative %: 52 %
Platelets: 213 10*3/uL (ref 150–400)
RBC: 2.99 MIL/uL — ABNORMAL LOW (ref 3.87–5.11)
RDW: 13.2 % (ref 11.5–15.5)
WBC: 5.5 10*3/uL (ref 4.0–10.5)
nRBC: 0 % (ref 0.0–0.2)

## 2022-02-28 LAB — URINALYSIS, ROUTINE W REFLEX MICROSCOPIC
Bacteria, UA: NONE SEEN
Bilirubin Urine: NEGATIVE
Glucose, UA: NEGATIVE mg/dL
Hgb urine dipstick: NEGATIVE
Ketones, ur: NEGATIVE mg/dL
Nitrite: NEGATIVE
Protein, ur: 100 mg/dL — AB
Specific Gravity, Urine: 1.011 (ref 1.005–1.030)
pH: 5 (ref 5.0–8.0)

## 2022-02-28 LAB — COMPREHENSIVE METABOLIC PANEL
ALT: 13 U/L (ref 0–44)
AST: 19 U/L (ref 15–41)
Albumin: 3.3 g/dL — ABNORMAL LOW (ref 3.5–5.0)
Alkaline Phosphatase: 44 U/L (ref 38–126)
Anion gap: 8 (ref 5–15)
BUN: 36 mg/dL — ABNORMAL HIGH (ref 8–23)
CO2: 24 mmol/L (ref 22–32)
Calcium: 8.5 mg/dL — ABNORMAL LOW (ref 8.9–10.3)
Chloride: 106 mmol/L (ref 98–111)
Creatinine, Ser: 3.49 mg/dL — ABNORMAL HIGH (ref 0.44–1.00)
GFR, Estimated: 14 mL/min — ABNORMAL LOW (ref >60)
Glucose, Bld: 179 mg/dL — ABNORMAL HIGH (ref 70–99)
Potassium: 4.7 mmol/L (ref 3.5–5.1)
Sodium: 138 mmol/L (ref 135–145)
Total Bilirubin: 0.5 mg/dL (ref 0.3–1.2)
Total Protein: 5.8 g/dL — ABNORMAL LOW (ref 6.5–8.1)

## 2022-02-28 LAB — TROPONIN I (HIGH SENSITIVITY)
Troponin I (High Sensitivity): 11 ng/L (ref <18)
Troponin I (High Sensitivity): 11 ng/L (ref <18)

## 2022-02-28 LAB — D-DIMER, QUANTITATIVE: D-Dimer, Quant: 1.61 ug/mL-FEU — ABNORMAL HIGH (ref 0.00–0.50)

## 2022-02-28 LAB — GLUCOSE, CAPILLARY: Glucose-Capillary: 195 mg/dL — ABNORMAL HIGH (ref 70–99)

## 2022-02-28 LAB — MAGNESIUM: Magnesium: 1.6 mg/dL — ABNORMAL LOW (ref 1.7–2.4)

## 2022-02-28 MED ORDER — SODIUM CHLORIDE 0.9 % IV BOLUS
500.0000 mL | Freq: Once | INTRAVENOUS | Status: AC
  Administered 2022-02-28: 500 mL via INTRAVENOUS

## 2022-02-28 MED ORDER — HEPARIN SODIUM (PORCINE) 5000 UNIT/ML IJ SOLN
5000.0000 [IU] | Freq: Three times a day (TID) | INTRAMUSCULAR | Status: DC
  Administered 2022-02-28 – 2022-03-01 (×2): 5000 [IU] via SUBCUTANEOUS
  Filled 2022-02-28 (×3): qty 1

## 2022-02-28 MED ORDER — EPINEPHRINE 0.3 MG/0.3ML IJ SOAJ
0.3000 mg | Freq: Once | INTRAMUSCULAR | Status: DC
  Filled 2022-02-28: qty 0.3

## 2022-02-28 MED ORDER — MAGNESIUM SULFATE 2 GM/50ML IV SOLN
2.0000 g | Freq: Once | INTRAVENOUS | Status: AC
  Administered 2022-02-28: 2 g via INTRAVENOUS
  Filled 2022-02-28: qty 50

## 2022-02-28 MED ORDER — ONDANSETRON HCL 4 MG/2ML IJ SOLN: 4.0000 mg | Freq: Four times a day (QID) | INTRAMUSCULAR | Status: DC | PRN

## 2022-02-28 MED ORDER — SODIUM CHLORIDE 0.9 % IV SOLN: INTRAVENOUS | Status: AC

## 2022-02-28 MED ORDER — ROSUVASTATIN CALCIUM 20 MG PO TABS
20.0000 mg | ORAL_TABLET | Freq: Every day | ORAL | Status: DC
  Administered 2022-03-01: 20 mg via ORAL
  Filled 2022-02-28: qty 1

## 2022-02-28 MED ORDER — PANTOPRAZOLE SODIUM 40 MG PO TBEC
40.0000 mg | DELAYED_RELEASE_TABLET | Freq: Two times a day (BID) | ORAL | Status: DC
  Administered 2022-02-28 – 2022-03-01 (×2): 40 mg via ORAL
  Filled 2022-02-28 (×2): qty 1

## 2022-02-28 MED ORDER — INSULIN ASPART 100 UNIT/ML IJ SOLN: 0.0000 [IU] | Freq: Three times a day (TID) | INTRAMUSCULAR | Status: DC

## 2022-02-28 MED ORDER — CARVEDILOL 25 MG PO TABS
25.0000 mg | ORAL_TABLET | Freq: Two times a day (BID) | ORAL | Status: DC
  Administered 2022-03-01: 25 mg via ORAL
  Filled 2022-02-28: qty 1

## 2022-02-28 MED ORDER — FAMOTIDINE 40 MG/5ML PO SUSR
20.0000 mg | Freq: Once | ORAL | Status: AC
  Administered 2022-02-28: 20 mg via ORAL
  Filled 2022-02-28: qty 2.5

## 2022-02-28 MED ORDER — ONDANSETRON HCL 4 MG PO TABS: 4.0000 mg | ORAL_TABLET | Freq: Four times a day (QID) | ORAL | Status: DC | PRN

## 2022-02-28 MED ORDER — ALUM & MAG HYDROXIDE-SIMETH 200-200-20 MG/5ML PO SUSP
30.0000 mL | Freq: Once | ORAL | Status: AC
  Administered 2022-02-28: 30 mL via ORAL
  Filled 2022-02-28: qty 30

## 2022-02-28 MED ORDER — NIFEDIPINE ER OSMOTIC RELEASE 60 MG PO TB24
60.0000 mg | ORAL_TABLET | Freq: Every day | ORAL | Status: DC
  Administered 2022-03-01: 60 mg via ORAL
  Filled 2022-02-28: qty 1

## 2022-02-28 MED ORDER — ACETAMINOPHEN 650 MG RE SUPP: 650.0000 mg | Freq: Four times a day (QID) | RECTAL | Status: DC | PRN

## 2022-02-28 MED ORDER — ACETAMINOPHEN 325 MG PO TABS: 650.0000 mg | ORAL_TABLET | Freq: Four times a day (QID) | ORAL | Status: DC | PRN

## 2022-02-28 NOTE — ED Triage Notes (Signed)
Pt BIB EMS from home for new onset chest pressure, dizziness, and weakness that woke her up at 0600. Pt also reports increased left leg swelling w/hx of HF

## 2022-02-28 NOTE — Assessment & Plan Note (Signed)
Continue simvastatin. 

## 2022-02-28 NOTE — Assessment & Plan Note (Signed)
Mostly describing GI symptoms but also reporting heavy pressure sensation across her chest.  EKG without acute ischemic changes and troponin negative x2.  D-dimer mildly elevated however she has not been hypoxic or tachycardic.  Cardiac cath 2015 showed mild nonobstructive CAD. -Cardiology consulting to determine further cardiac work-up -Will start on trial of Protonix 40 mg BID -Keep on telemetry

## 2022-02-28 NOTE — ED Provider Notes (Signed)
[facility] DEPARTMENT Provider Note   CSN: [id number] Arrival date & time: 02/28/22  2122     History {Add pertinent medical, surgical, social history, OB history to HPI:1} Chief Complaint  Patient presents with   Chest Pain    DUDLEY COOLEY is a 71 y.o. female.  Pt is a 71 yo female with pmh of hypertension, hyperlipidemia, CAD, renal artery stenosis.. presenting for chest pain. Pt admits to sternal chest pain described as "chest pressure" with radiation to the left shoulder and left neck with associated diaphoresis that started this morning upon waking. Denies exacerbating factors. Pressure is still persistent. Patient denies fevers, chills, or coughing. Denies shortness of breath.   Of note, patient arrived hypotensive with BP of 92/54.  Has a history of hypertension and normally takes Carvedilol 25 mg twice daily, Nifedipine 60 mg ER QD, and furosemide 20 mg QD. Patient did NOT take her blood pressure meds this morning.   The history is provided by the patient. No language interpreter was used.  Chest Pain      Home Medications Prior to Admission medications   Medication Sig Start Date End Date Taking? Authorizing Provider  albuterol (VENTOLIN HFA) 108 (90 Base) MCG/ACT inhaler Inhale 2 puffs into the lungs every 4 (four) hours as needed for wheezing or shortness of breath. 10/23/21   [provider]  carvedilol (COREG) 25 MG tablet Take 25 mg by mouth 2 (two) times daily with a meal.    [provider]  cycloSPORINE, PF, (CEQUA) 0.09 % SOLN Place 1 drop into both eyes every evening. Patient not taking: Reported on 02/25/2022 12/18/20   [provider]  ezetimibe (ZETIA) 10 MG tablet Take 10 mg by mouth daily. 06/09/21   [provider]  famotidine (PEPCID) 40 MG tablet Take 40 mg by mouth daily. 09/04/21   [provider]  fluticasone (FLONASE) 50 MCG/ACT nasal spray Place 2 sprays into both nostrils daily.     [provider]  glucose blood (ONE TOUCH ULTRA TEST) test strip Use to check blood sugar 3 times daily Dx code E11.65 Patient taking differently: 1 each by Other route See admin instructions. Use to check blood sugar 3 times daily Dx code E11.65 11/20/14   Elayne Snare, MD  indapamide (LOZOL) 2.5 MG tablet Take 2.5 mg by mouth daily. 09/22/21   [provider]  lisinopril (ZESTRIL) 10 MG tablet Take 10 mg by mouth daily.    [provider]  NIFEdipine (ADALAT CC) 60 MG 24 hr tablet Take 1 tablet (60 mg total) by mouth daily. 07/09/21   Daleen Bo, MD  OLANZapine (ZYPREXA) 2.5 MG tablet Take 1 tablet (2.5 mg total) by mouth at bedtime for 7 days. Patient not taking: Reported on 02/25/2022 10/28/21 11/04/21  Darliss Cheney, MD  omeprazole (PRILOSEC) 20 MG capsule Take 20 mg by mouth daily.    [provider]  ONETOUCH VERIO test strip [place] Patient taking differently: 1 each by Other route in the morning, at noon, and at bedtime. 03/30/18   Elayne Snare, MD  OZEMPIC, 0.25 OR 0.5 MG/DOSE, 2 MG/1.5ML SOPN Inject 0.25 mg into the skin once a week. Sundays 05/12/21   [provider]  polyethylene glycol (MIRALAX / GLYCOLAX) 17 g packet Take 17 g by mouth daily. Patient not taking: Reported on 10/25/2021 09/28/21   Eugenie Filler, MD  rosuvastatin (CRESTOR) 20 MG tablet Take 20 mg by mouth daily.  [provider]      Allergies    Hydralazine hcl and Atorvastatin    Review of Systems   Review of Systems  Cardiovascular:  Positive for chest pain.    Physical Exam Updated Vital Signs BP (!) 102/54   Pulse (!) 57   Temp 97.7 F (36.5 C) (Oral)   Resp 17   Ht '5\' 7"'$  (1.702 m)   Wt 70.3 kg   SpO2 100%   BMI 24.28 kg/m  Physical Exam  ED Results / Procedures / Treatments   Labs (all labs ordered are listed, but only abnormal results are displayed) Labs Reviewed  CBC WITH DIFFERENTIAL/PLATELET  BASIC  METABOLIC PANEL  MAGNESIUM  URINALYSIS, ROUTINE W REFLEX MICROSCOPIC  TROPONIN I (HIGH SENSITIVITY)    EKG None  Radiology No results found.  Procedures Procedures  {Document cardiac monitor, telemetry assessment procedure when appropriate:1}  Medications Ordered in ED Medications - No data to display  ED Course/ Medical Decision Making/ A&P                           Medical Decision Making Amount and/or Complexity of Data Reviewed Labs: ordered. Radiology: ordered.   ***  {Document critical care time when appropriate:1} {Document review of labs and clinical decision tools ie heart score, Chads2Vasc2 etc:1}  {Document your independent review of radiology images, and any outside records:1} {Document your discussion with family members, caretakers, and with consultants:1} {Document social determinants of health affecting pt's care:1} {Document your decision making why or why not admission, treatments were needed:1} Final Clinical Impression(s) / ED Diagnoses Final diagnoses:  None    Rx / DC Orders ED Discharge Orders     None

## 2022-02-28 NOTE — Hospital Course (Signed)
Teresa Poole is a 71 y.o. female with medical history significant for CKD stage IV, HFpEF (EF improved to 55-60%, G1DD by TTE 06/11/2021), nonischemic cardiomyopathy, nonobstructive CAD, RBBB, left renal artery stenosis s/p stenting, T2DM, HTN, HLD, anemia of CKD, anxiety, memory issues who is admitted for chest pain evaluation.

## 2022-02-28 NOTE — Assessment & Plan Note (Signed)
Hemoglobin 8.6, slightly decreased from baseline 9.2-10.0.  No obvious bleeding.  Continue to monitor.

## 2022-02-28 NOTE — Assessment & Plan Note (Signed)
Euvolemic on admission.  Last EF 55-60%, G1 DD by TTE 06/11/2021. -Holding Lasix as above -Continue Coreg -Monitor strict I/O's and daily weights

## 2022-02-28 NOTE — Progress Notes (Signed)
VASCULAR LAB    Left lower extremity venous duplex has been performed.  See CV proc for preliminary results.  Relayed negative results to Dr. Pearline Cables via secure message  Sharion Dove, RVT 02/28/2022, 12:46 PM

## 2022-02-28 NOTE — Assessment & Plan Note (Signed)
Creatinine 3.49 on admission with variable recent baseline 2.2-3.0.  Transiently hypotensive on ED arrival. -Start gentle IV fluids with NS'@75'$  mL/hour overnight -Hold lisinopril and Lasix for now -Monitor urine output and repeat labs in a.m.

## 2022-02-28 NOTE — Assessment & Plan Note (Signed)
Transiently hypotensive on ED arrival, reportedly after receiving nitroglycerin.  Improved after 500 cc NS bolus. -Will restart home Coreg and nifedipine tomorrow -Holding lisinopril with AKI, not clear if patient has been taking this regularly

## 2022-02-28 NOTE — H&P (Signed)
History and Physical    Teresa Poole UPJ:[id number] DOB: December 22, 1950 DOA: 02/28/2022  PCP: [name], MD  Patient coming from: Home via EMS  I have personally briefly reviewed patient's old medical records in [place]  Chief Complaint: Chest pain  HPI: Teresa Poole is a 71 y.o. female with medical history significant for CKD stage IV, HFpEF (EF improved to 55-60%, G1DD by TTE 06/11/2021), nonischemic cardiomyopathy, nonobstructive CAD, RBBB, left renal artery stenosis s/p stenting, T2DM, HTN, HLD, anemia of CKD, anxiety, memory issues who presented to the ED for evaluation of chest pain.  Patient reports several months of "sulfuric acid" burning sensation affecting her throat, mid chest, and epigastric region usually occurring every morning when she is waking up.  Over the last week or so the symptoms have been fairly constant with no correlation to level activity or position.  She is also complaining of associated heavy pressure-like sensation on her chest.  She says previously she could take Tums and/or Mylanta with some relief of the symptoms however this is no longer the case.  She is also having generalized pruritus and states that she feels like her whole body is "on fire."  She has had associated intermittent nonproductive.  She denies any nausea, vomiting, dyspnea.  She reports regular bowel movements although diminished volume.  She reports good urine output.  She reports occasional aspirin use.  She denies any NSAID use.  Last cardiac catheterization in 2015 showed nonobstructive minimal CAD.  ED Course  Labs/Imaging on admission: I have personally reviewed following labs and imaging studies.  Initial vitals showed BP 97/55, pulse 56, RR 17, temp 97.7 F, SPO2 100% on room air.  Labs show WBC 5.5, hemoglobin 8.6, platelets 213,000, sodium 138, potassium 4.7, bicarb 24, BUN 36, creatinine 3.49 (variable baseline 2.2-3.0 on review of labs), magnesium 1.6,  troponin 11x2, D-dimer 1.61.  Urinalysis negative for UTI.  Portable chest x-ray negative for focal consolidation, edema, effusion.  VQ scan ordered and pending.  Left lower extremity venous Doppler preliminary read negative for evidence of DVT.  Patient was given 500 cc normal saline, oral Pepcid and Maalox.  She was felt to have a heart score of 4.  EDP consulted on-call cardiology, Dr. Renella Cunas, who will see in consultation.  The hospitalist service was consulted to admit for further evaluation and management of chest pain.  Review of Systems: All systems reviewed and are negative except as documented in history of present illness above.   Past Medical History:  Diagnosis Date   Allergy    Anemia    Carotid stenosis    Chronic combined systolic and diastolic CHF (congestive heart failure) (HCC)    CKD (chronic kidney disease), stage IV ([place]) 09/24/2013   Pt at [place], Dr. Joelyn Oms   COVID-19 10/24/2021   Diabetes mellitus type 2 in nonobese [facility])    Edema 09/14/2013   GERD (gastroesophageal reflux disease)    History of ovarian cancer    Hyperlipidemia    Hypertension    Mild CAD    non-obstructive by LHC (09/25/2013): Proximal and mid LAD serial 20%, proximal circumflex 30%, mid AV groove circumflex 30%, mid RCA mild plaque.   NICM (nonischemic cardiomyopathy) (HCC)    Obesity (BMI 30-39.9)    RBBB    Renal artery stenosis ([place]) 10/11/2018   Thyroid disease    Seen by specialist    Past Surgical History:  Procedure Laterality Date   LEFT HEART CATHETERIZATION WITH CORONARY  ANGIOGRAM N/A 09/25/2013   Procedure: LEFT HEART CATHETERIZATION WITH CORONARY ANGIOGRAM;  Surgeon: Burnell Blanks, MD;  Location: [facility] CATH LAB;  Service: Cardiovascular;  Laterality: N/A;   RENAL ANGIOGRAPHY N/A 10/06/2018   Procedure: RENAL ANGIOGRAPHY;  Surgeon: Marty Heck, MD;  Location: [facility] CV LAB;  Service: Cardiovascular;  Laterality: N/A;    Social History:   reports that she has never smoked. She has never used smokeless tobacco. She reports that she does not drink alcohol and does not use drugs.  Allergies  Allergen Reactions   Hydralazine Hcl Itching    ENTIRE BODY = burning sensation, also in the eyes (they have become very sensitive to light)   Atorvastatin Other (See Comments)    Per MD - pt not sure of reaction     Family History  Problem Relation Age of Onset   Diabetes Father    Hypertension Father    Hypertension Maternal Grandfather      Prior to Admission medications   Medication Sig Start Date End Date Taking? Authorizing Provider  albuterol (VENTOLIN HFA) 108 (90 Base) MCG/ACT inhaler Inhale 2 puffs into the lungs every 4 (four) hours as needed for wheezing or shortness of breath. 10/23/21   [provider]  carvedilol (COREG) 25 MG tablet Take 25 mg by mouth 2 (two) times daily with a meal.    [provider]  cycloSPORINE, PF, (CEQUA) 0.09 % SOLN Place 1 drop into both eyes every evening. Patient not taking: Reported on 02/25/2022 12/18/20   [provider]  ezetimibe (ZETIA) 10 MG tablet Take 10 mg by mouth daily. 06/09/21   [provider]  famotidine (PEPCID) 40 MG tablet Take 40 mg by mouth daily. 09/04/21   [provider]  fluticasone (FLONASE) 50 MCG/ACT nasal spray Place 2 sprays into both nostrils daily.    [provider]  glucose blood (ONE TOUCH ULTRA TEST) test strip Use to check blood sugar 3 times daily Dx code E11.65 Patient taking differently: 1 each by Other route See admin instructions. Use to check blood sugar 3 times daily Dx code E11.65 11/20/14   Elayne Snare, MD  indapamide (LOZOL) 2.5 MG tablet Take 2.5 mg by mouth daily. 09/22/21   [provider]  lisinopril (ZESTRIL) 10 MG tablet Take 10 mg by mouth daily.    [provider]  NIFEdipine (ADALAT CC) 60 MG 24 hr tablet Take 1 tablet (60 mg total) by mouth daily. 07/09/21   Daleen Bo, MD  OLANZapine (ZYPREXA) 2.5 MG tablet Take 1 tablet (2.5 mg total) by mouth at bedtime for 7 days. Patient not taking: Reported on 02/25/2022 10/28/21 11/04/21  Darliss Cheney, MD  omeprazole (PRILOSEC) 20 MG capsule Take 20 mg by mouth daily.    [provider]  ONETOUCH VERIO test strip [place] Patient taking differently: 1 each by Other route in the morning, at noon, and at bedtime. 03/30/18   Elayne Snare, MD  OZEMPIC, 0.25 OR 0.5 MG/DOSE, 2 MG/1.5ML SOPN Inject 0.25 mg into the skin once a week. Sundays 05/12/21   [provider]  polyethylene glycol (MIRALAX / GLYCOLAX) 17 g packet Take 17 g by mouth daily. Patient not taking: Reported on 10/25/2021 09/28/21   Eugenie Filler, MD  rosuvastatin (CRESTOR) 20 MG tablet Take 20 mg by mouth daily.    [provider]    Physical Exam: Vitals:   02/28/22 1900 02/28/22 1915 02/28/22 1930 02/28/22  1945  BP: 136/75 126/64  (!) 147/73  Pulse: 80 76  86  Resp: '15 17  15  '$ Temp:   98.1 F (36.7 C)   TempSrc:   Oral   SpO2: 100% 100%  100%  Weight:      Height:       Constitutional: Resting in bed, NAD, calm, comfortable Eyes: EOMI, lids and conjunctivae normal ENMT: Mucous membranes are moist. Posterior pharynx clear of any exudate or lesions.Normal dentition.  Neck: normal, supple, no masses. Respiratory: clear to auscultation bilaterally, no wheezing, no crackles. Normal respiratory effort. No accessory muscle use.  Cardiovascular: Regular rate and rhythm, no murmurs / rubs / gallops. No extremity edema. 2+ pedal pulses. Abdomen: Mild upper abdominal tenderness, no masses palpated. No hepatosplenomegaly. Bowel sounds positive.  Musculoskeletal: no clubbing / cyanosis. No joint deformity upper and lower extremities. Good ROM, no contractures. Normal muscle tone.  Skin: no rashes, lesions, ulcers. No induration Neurologic: Sensation intact. Strength 5/5 in all 4.  Psychiatric: Alert and  oriented x 3. Normal mood.   EKG: Personally reviewed. Normal sinus rhythm, first-degree AV block, RBBB, no acute ischemic changes.  Similar to prior.  Assessment/Plan Principal Problem:   Atypical chest pain Active Problems:   Acute renal failure superimposed on stage 4 chronic kidney disease (HCC)   (HFpEF) heart failure with preserved ejection fraction ([place])   Hypertension associated with diabetes ([place])   Type 2 diabetes mellitus with chronic kidney disease, without long-term current use of insulin (HCC)   Hyperlipidemia associated with type 2 diabetes mellitus ([place])   Anemia of chronic kidney failure, stage 4 (severe) ([place])   Hypomagnesemia   Teresa Poole is a 71 y.o. female with medical history significant for CKD stage IV, HFpEF (EF improved to 55-60%, G1DD by TTE 06/11/2021), nonischemic cardiomyopathy, nonobstructive CAD, RBBB, left renal artery stenosis s/p stenting, T2DM, HTN, HLD, anemia of CKD, anxiety, memory issues who is admitted for chest pain evaluation.  Assessment and Plan: * Atypical chest pain Mostly describing GI symptoms but also reporting heavy pressure sensation across her chest.  EKG without acute ischemic changes and troponin negative x2.  D-dimer mildly elevated however she has not been hypoxic or tachycardic.  Cardiac cath 2015 showed mild nonobstructive CAD. -Cardiology consulting to determine further cardiac work-up -Will start on trial of Protonix 40 mg BID -Keep on telemetry  Acute renal failure superimposed on stage 4 chronic kidney disease (HCC) Creatinine 3.49 on admission with variable recent baseline 2.2-3.0.  Transiently hypotensive on ED arrival. -Start gentle IV fluids with NS'@75'$  mL/hour overnight -Hold lisinopril and Lasix for now -Monitor urine output and repeat labs in a.m.  (HFpEF) heart failure with preserved ejection fraction ([place]) Euvolemic on admission.  Last EF 55-60%, G1 DD by TTE 06/11/2021. -Holding Lasix as above -Continue  Coreg -Monitor strict I/O's and daily weights  Hypertension associated with diabetes ([place]) Transiently hypotensive on ED arrival, reportedly after receiving nitroglycerin.  Improved after 500 cc NS bolus. -Will restart home Coreg and nifedipine tomorrow -Holding lisinopril with AKI, not clear if patient has been taking this regularly  Type 2 diabetes mellitus with chronic kidney disease, without long-term current use of insulin ([place]) Hold home Ozempic.  Placed on SSI.  Hyperlipidemia associated with type 2 diabetes mellitus (HCC) Continue simvastatin.  Hypomagnesemia IV supplement ordered.  Repeat labs in AM.  Anemia of chronic kidney failure, stage 4 (severe) (HCC) Hemoglobin 8.6, slightly decreased from baseline 9.2-10.0.  No obvious bleeding.  Continue to  monitor.  DVT prophylaxis: heparin injection 5,000 Units Start: 02/28/22 2200 Code Status: Full code, confirmed with patient on admission Family Communication: None present on admission Disposition Plan: From home and likely discharge to home pending clinical progress Consults called: Cardiology Severity of Illness: The appropriate patient status for this patient is OBSERVATION. Observation status is judged to be reasonable and necessary in order to provide the required intensity of service to ensure the patient's safety. The patient's presenting symptoms, physical exam findings, and initial radiographic and laboratory data in the context of their medical condition is felt to place them at decreased risk for further clinical deterioration. Furthermore, it is anticipated that the patient will be medically stable for discharge from the hospital within 2 midnights of admission.   Zada Finders MD [facility]  If 7PM-7AM, please contact night-coverage [email or web address]  02/28/2022, 8:43 PM

## 2022-02-28 NOTE — Assessment & Plan Note (Signed)
IV supplement ordered.  Repeat labs in AM.

## 2022-02-28 NOTE — Consult Note (Incomplete)
Cardiology Consultation:   Patient ID: Teresa Poole MRN: [id number]; DOB: Jun 12, 1951  Admit date: 02/28/2022 Date of Consult: 03/01/2022  Primary Care Provider: Lilian Poole., MD [facility] Cardiologist: Teresa Grooms, MD  Teresa Poole Electrophysiologist:  None   Patient Profile:   Teresa Poole is a 71 y.o. female with NICM with chronic combined CHF, mild nonobstructive CAD by cath 2015, anemia, CKD stage IV, HTN, HLD, RBBB, GERD, carotid stenosis (4-25% ZDGL/87-56% LICA 43/3295) and L renal artery stenosis s/p stenting 09/2018 (managed by vascular), and thyroid disease who is being seen 02/28/22 for the evaluation of chest pain at the request of Teresa Poole.   History of Present Illness:   Teresa Poole was originally seen by our team in 2015 for chest pain, cardiomyopathy with EF 30-35%, and abnormal nuclear stress test. She underwent coronary angiography showing nonobstructive CAD with serial 20% prox and mid LAD stenosis, 30% prox LCx stenosis, 30% mid AV groove Cx stenosis. She was thought to have predominately NICM and she was treated medically. She has prior renal artery stenting by vascular in 2020. She was last seen by cardiology on 08/25/21 with Teresa Poole when she had recurrent CP.  She describes severe chest discomfort and then persistent for 3 years and always all day, every day.  Chest discomfort was not aggravated by exertion or inspiration. hsT was flat (15->14) with worsening CKD (sCr 2.74, prior 2.0-2.6).  Her chest pain was thought to be noncardiac as her troponins were negative and ECG was unchanged prior.  Her symptoms of "sulfuric acid burning in eyeballs, saliva, secretions, back, chest, abdomen, legs and toes" was thought to be possible somatic symptom disorder.  She had been seen in the emergency department several times over the prior few months for various issues including head rushes, neck tightness, GI tract on fire, acid in her kidneys, skin  lesions and disorientation.  It was thought that since the chest pain was most likely noncardiac in nature that no additional stress testing was required and she already had cardiology follow-up in January.  Lisinopril was held with her AKI and mild hypertension.  She had follow-up with outpatient cardiology through [facility] on 09/05/21 by Teresa Poole. No changes to her medication regimen and no additional testing ordered.   Review of prior cardiac evaluation 08/25/21: chest pain presented to the ED, L chest and back pain, hsT 15-14 07/01/21: dizziness and presented to ED, also with neck tightness and chronic indigestion  06/11/21: EF 55-60%, mild basal inferior hypokinesis  06/09/21: chest pain, L neck pain and radiation to L arm, hsT 17-23-35-31 2015: NICM in 2015, EF 35%, Nonobstructive CAD by coronary angiography in 09/2013 Abnormal nuclear stress test with large scar and small area of ischemia in 08/2013  She followed with Teresa Poole (family medicine, [facility]) who had referred her to both neurology, psychiatry and behavioral health for "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 At her most recent appointment she had reported itching inside her mouth and was concerned about a food allergy however physical exam was unrevealing.   ED evaluations for 2023 02/28/22: new onset chest pressure, dizziness, weakness, L leg swelling  02/25/22: itching entire body, peppery saliva, BP 220/90 02/21/22: chest pressure constant, feel heart beat more intense  02/20/22: loss of time for 3.5 hours, then SOB and CP during evaluation  01/07/22: itching all over 12/28/21: generalized weakness 12/08/21: itching all over, weakness, left AMA  11/29/21:  HA, weakness, nausea, CT head nl  10/25/21: covid dx, nasal congestion, chest congestion, dizziness, weakness 10/17/21: [facility] for psych eval, threatened to take pills and kill herself, mood  swings during triage 10/14/21: dizziness with standing, AKI with sCr 3.71 from prior 2.9, left AMA 09/23/21: N/V, abd pain, CT scan with high grade SBO and transition point in distal jejunum, thought to be 2/2 adhesions from prior abd surgery, NGT and improved 09/22/21: mid/L side abd pain, feels like gas and insides are going to fall out  09/15/21: loss of bladder control, itching in bladder and GI tract, burning all over, UA negative, dysuria improved with GI cocktail 09/09/21: central CP, radiation to R arm and shoulder, ECG and hsT (9-12) normal, GI cocktail with improvement in sx.  09/06/21: covid exposure, wanted testing but asx  Today (07/08) she was brought in by EMS for new onset chest pressure, dizziness, and weakness that woke her up at 0600. This is her 16th ED evaluation this calendar year. She has a vareity of complaints over the past  She also had increased L leg swelling.  She described her symptoms as chest pressure with radiation to the left shoulder with diaphoresis after waking up this morning.  Pressure was ongoing no associated shortness of breath.  She had not taken her blood pressure medications this morning (carvedilol 25 mg bid, nifedipine 60 mg ER daily) and her BP on initial evaluation was 92/54.   VS on arrival to the ED: P 57, BP 102/54, RR 17, O2 100%/RA.  She then said that her entire body felt like "acid was burning from her tear ducts to her saliva, throughtoput her GI tract and down her toes."  She was reporting near daily symptoms of this.  Lower extremity venous duplex ultrasound was performed with the swelling noted on exam and a positive D-dimer (1.61). She had a positive D-dimer during her prior evaluation 4 months ago (0.7 on 10/25/21).   During my evaluation she reports mild "acid feeling in her stomach" that is much better than when she first came in. She doesn't think she is on PPI as OP although it is prescribed. She feels like this is similar to her prior  episodes. She denies any obvious chest pain.   Past Medical History:  Diagnosis Date   Allergy    Anemia    Carotid stenosis    Chronic combined systolic and diastolic CHF (congestive heart failure) (HCC)    CKD (chronic kidney disease), stage IV ([place]) 09/24/2013   Pt at [place], Dr. Joelyn Oms   COVID-19 10/24/2021   Diabetes mellitus type 2 in nonobese [facility])    Edema 09/14/2013   GERD (gastroesophageal reflux disease)    History of ovarian cancer    Hyperlipidemia    Hypertension    Mild CAD    non-obstructive by LHC (09/25/2013): Proximal and mid LAD serial 20%, proximal circumflex 30%, mid AV groove circumflex 30%, mid RCA mild plaque.   NICM (nonischemic cardiomyopathy) (HCC)    Obesity (BMI 30-39.9)    RBBB    Renal artery stenosis ([place]) 10/11/2018   Thyroid disease    Seen by specialist   Past Surgical History:  Procedure Laterality Date   LEFT HEART CATHETERIZATION WITH CORONARY ANGIOGRAM N/A 09/25/2013   Procedure: LEFT HEART CATHETERIZATION WITH CORONARY ANGIOGRAM;  Surgeon: Burnell Blanks, MD;  Location: [facility] CATH LAB;  Service: Cardiovascular;  Laterality: N/A;   RENAL ANGIOGRAPHY N/A 10/06/2018   Procedure: RENAL ANGIOGRAPHY;  Surgeon: Carlis Abbott,  Gwenyth Allegra, MD;  Location: [facility] CV LAB;  Service: Cardiovascular;  Laterality: N/A;    Home Medications:  Prior to Admission medications   Medication Sig Start Date End Date Taking? Authorizing Provider  carvedilol (COREG) 25 MG tablet Take 25 mg by mouth 2 (two) times daily with a meal.   Yes [provider]  cetirizine (ZYRTEC) 10 MG tablet Take 10 mg by mouth daily as needed for allergies.   Yes [provider]  cycloSPORINE, PF, (CEQUA) 0.09 % SOLN Place 1 drop into both eyes 2 (two) times daily. 12/18/20  Yes [provider]  famotidine (PEPCID) 40 MG tablet Take 40 mg by mouth daily. 09/04/21  Yes [provider]  fluticasone (FLONASE) 50 MCG/ACT nasal spray Place 2  sprays into both nostrils daily.   Yes [provider]  furosemide (LASIX) 20 MG tablet Take 20 mg by mouth daily.   Yes [provider]  NIFEdipine (ADALAT CC) 60 MG 24 hr tablet Take 1 tablet (60 mg total) by mouth daily. 07/09/21  Yes Daleen Bo, MD  omeprazole (PRILOSEC) 20 MG capsule Take 20 mg by mouth daily.   Yes [provider]  OZEMPIC, 0.25 OR 0.5 MG/DOSE, 2 MG/1.5ML SOPN Inject 0.25 mg into the skin once a week. Sundays 05/12/21  Yes [provider]  rosuvastatin (CRESTOR) 20 MG tablet Take 20 mg by mouth daily.   Yes [provider]  albuterol (VENTOLIN HFA) 108 (90 Base) MCG/ACT inhaler Inhale 2 puffs into the lungs every 4 (four) hours as needed for wheezing or shortness of breath. Patient not taking: Reported on 02/28/2022 10/23/21   [provider]  glucose blood (ONE TOUCH ULTRA TEST) test strip Use to check blood sugar 3 times daily Dx code E11.65 Patient taking differently: 1 each by Other route See admin instructions. Use to check blood sugar 3 times daily Dx code E11.65 11/20/14   Elayne Snare, MD  lisinopril (ZESTRIL) 10 MG tablet Take 10 mg by mouth daily.    [provider]  meclizine (ANTIVERT) 25 MG tablet Take 25 mg by mouth 3 (three) times daily as needed for dizziness.    [provider]  OLANZapine (ZYPREXA) 2.5 MG tablet Take 1 tablet (2.5 mg total) by mouth at bedtime for 7 days. Patient not taking: Reported on 02/25/2022 10/28/21 11/04/21  Darliss Cheney, MD  Echo test strip Cade Patient taking differently: 1 each by Other route in the morning, at noon, and at bedtime. 03/30/18   Elayne Snare, MD  polyethylene glycol (MIRALAX / GLYCOLAX) 17 g packet Take 17 g by mouth daily. Patient not taking: Reported on 10/25/2021 09/28/21   Eugenie Filler, MD   Inpatient Medications: Scheduled Meds:  Continuous Infusions:  PRN Meds:  Allergies:    Allergies  Allergen  Reactions   Hydralazine Hcl Itching    ENTIRE BODY = burning sensation, also in the eyes (they have become very sensitive to light)   Atorvastatin Other (See Comments)    Per MD - pt not sure of reaction    Social History:   Social History   Socioeconomic History   Marital status: Single    Spouse name: Not on file   Number of children: 0   Years of education: Not on file   Highest education level: Not on file  Occupational History    Employer: [employer]  Tobacco Use   Smoking status: Never  Smokeless tobacco: Never  Vaping Use   Vaping Use: Never used  Substance and Sexual Activity   Alcohol use: No   Drug use: No   Sexual activity: Not on file  Other Topics Concern   Not on file  Social History Narrative   Works at [employer]   Patient lives at home alone.    Patient has no children.    Patient patient is right handed.    Patient is single.    Social Determinants of Health   Financial Resource Strain: Not on file  Food Insecurity: Not on file  Transportation Needs: Not on file  Physical Activity: Not on file  Stress: Not on file  Social Connections: Not on file  Intimate Partner Violence: Not on file    Family History:    Family History  Problem Relation Age of Onset   Diabetes Father    Hypertension Father    Hypertension Maternal Grandfather     ROS:  Review of Systems: [y] = yes, '[ ]'$  = no      General: Weight gain '[ ]'$ ; Weight loss '[ ]'$ ; Anorexia '[ ]'$ ; Fatigue '[ ]'$ ; Fever '[ ]'$ ; Chills '[ ]'$ ; Weakness '[ ]'$    Cardiac: Chest pain/pressure '[ ]'$ ; Resting SOB '[ ]'$ ; Exertional SOB '[ ]'$ ; Orthopnea '[ ]'$ ; Pedal Edema '[ ]'$ ; Palpitations '[ ]'$ ; Syncope '[ ]'$ ; Presyncope '[ ]'$ ; Paroxysmal nocturnal dyspnea '[ ]'$    Pulmonary: Cough '[ ]'$ ; Wheezing '[ ]'$ ; Hemoptysis '[ ]'$ ; Sputum '[ ]'$ ; Snoring '[ ]'$    GI: Vomiting '[ ]'$ ; Dysphagia '[ ]'$ ; Melena '[ ]'$ ; Hematochezia '[ ]'$ ; Heartburn [y]; Abdominal pain '[ ]'$ ; Constipation '[ ]'$ ; Diarrhea '[ ]'$ ; BRBPR '[ ]'$    GU: Hematuria '[ ]'$ ; Dysuria '[ ]'$ ; Nocturia [  ] Vascular: Pain in legs with walking '[ ]'$ ; Pain in feet with lying flat '[ ]'$ ; Non-healing sores '[ ]'$ ; Stroke '[ ]'$ ; TIA '[ ]'$ ; Slurred speech '[ ]'$ ;   Neuro: Headaches '[ ]'$ ; Vertigo '[ ]'$ ; Seizures '[ ]'$ ; Paresthesias '[ ]'$ ;Blurred vision '[ ]'$ ; Diplopia '[ ]'$ ; Vision changes '[ ]'$    Ortho/Skin: Arthritis '[ ]'$ ; Joint pain '[ ]'$ ; Muscle pain '[ ]'$ ; Joint swelling '[ ]'$ ; Back Pain '[ ]'$ ; Rash '[ ]'$    Psych: Depression '[ ]'$ ; Anxiety '[ ]'$    Heme: Bleeding problems '[ ]'$ ; Clotting disorders '[ ]'$ ; Anemia '[ ]'$    Endocrine: Diabetes '[ ]'$ ; Thyroid dysfunction '[ ]'$    Physical Exam/Data:   Vitals:   02/28/22 1930 02/28/22 1945 02/28/22 2100 03/01/22 0500  BP:  (!) 147/73 (!) 174/73 (!) 162/71  Pulse:  86 82 67  Resp:  15  16  Temp: 98.1 F (36.7 C)  98.1 F (36.7 C)   TempSrc: Oral  Oral   SpO2:  100% 100% 100%  Weight:    69.5 kg  Height:        Intake/Output Summary (Last 24 hours) at 03/01/2022 0657 Last data filed at 02/28/2022 1835 Gross per 24 hour  Intake 500 ml  Output --  Net 500 ml      03/01/2022    5:00 AM 02/28/2022    7:43 AM 12/28/2021    8:58 PM  Last 3 Weights  Weight (lbs) 153 lb 3.2 oz 155 lb 188 lb  Weight (kg) 69.491 kg 70.308 kg 85.276 kg     Body mass index is 23.99 kg/m.  General:  Well nourished, well developed, in no acute distress HEENT: normal Lymph: no adenopathy Neck: no JVD  Endocrine:  No thryomegaly Vascular: No carotid bruits; FA pulses 2+ bilaterally without bruits  Cardiac:  normal S1, S2; RRR; no murmur  Lungs:  clear to auscultation bilaterally, no wheezing, rhonchi or rales  Abd: soft, nontender, no hepatomegaly  Ext: no edema Musculoskeletal:  No deformities, BUE and BLE strength normal and equal Skin: warm and dry  Neuro:  CNs 2-12 intact, no focal abnormalities noted Psych:  Normal affect   EKG:  The EKG was personally reviewed and demonstrates: (02/28/22, 07:49:08) NSR, 56 bpm, PR 201 ms, QRS 168 ms, Qtc 480 ms  Telemetry:  Telemetry was personally reviewed and demonstrates:  NSR  Relevant CV Studies: Cardiac cath from Feb, 2015 -  Proximal and mid LAD 20% stenosis. Proximal and mid LCx with 30% stenosis. Moderate caliber RCA - non-dominant vessel with mild mid plaque.  LM normal.  Laboratory Data:  High Sensitivity Troponin:   Recent Labs  Lab 02/21/22 0139 02/25/22 1525 02/25/22 1740 02/28/22 0832 02/28/22 1050  TROPONINIHS 14 21* 23* 11 11     Chemistry Recent Labs  Lab 02/25/22 1525 02/28/22 0832 03/01/22 0117  NA 140 138 141  K 5.1 4.7 4.6  CL 106 106 109  CO2 '26 24 24  '$ GLUCOSE 122* 179* 151*  BUN 40* 36* 31*  CREATININE 3.00* 3.49* 2.96*  CALCIUM 9.2 8.5* 8.7*  GFRNONAA 16* 14* 16*  ANIONGAP '8 8 8    '$ Recent Labs  Lab 02/25/22 1525 02/28/22 0832  PROT 6.6 5.8*  ALBUMIN 3.6 3.3*  AST 19 19  ALT 13 13  ALKPHOS 46 44  BILITOT 0.5 0.5   Hematology Recent Labs  Lab 02/25/22 1525 02/28/22 0832 03/01/22 0117  WBC 5.2 5.5 4.9  RBC 3.54* 2.99* 3.08*  HGB 10.1* 8.6* 8.8*  HCT 31.6* 26.7* 26.9*  MCV 89.3 89.3 87.3  MCH 28.5 28.8 28.6  MCHC 32.0 32.2 32.7  RDW 13.6 13.2 13.2  PLT 252 213 224   BNPNo results for input(s): "BNP", "PROBNP" in the last 168 hours.  DDimer  Recent Labs  Lab 02/28/22 0832  DDIMER 1.61*    Radiology/Studies:  VAS [place] LOWER EXTREMITY VENOUS (DVT)  Result Date: 02/28/2022  Lower Venous DVT Study Patient Name:  JARIELYS GIRARDOT  Date of Exam:   02/28/2022 Medical Rec #: [id number]           Accession #:    [id number] Date of Birth: 23-Aug-1951           Patient Gender: F Patient Age:   70 years Exam Location:  [facility] Procedure:      VAS [place] LOWER EXTREMITY VENOUS (DVT) Referring Phys: Campbell Stall --------------------------------------------------------------------------------  Indications: Swelling.  Comparison Study: No prior study on file Performing Technologist: Sharion Dove RVS  Examination Guidelines: A complete evaluation includes B-mode imaging, spectral Doppler, color Doppler, and  power Doppler as needed of all accessible portions of each vessel. Bilateral testing is considered an integral part of a complete examination. Limited examinations for reoccurring indications may be performed as noted. The reflux portion of the exam is performed with the patient in reverse Trendelenburg.  +-----+---------------+---------+-----------+----------+--------------+ RIGHTCompressibilityPhasicitySpontaneityPropertiesThrombus Aging +-----+---------------+---------+-----------+----------+--------------+ CFV  Full           Yes      Yes                                 +-----+---------------+---------+-----------+----------+--------------+   +---------+---------------+---------+-----------+----------+--------------+ LEFT  CompressibilityPhasicitySpontaneityPropertiesThrombus Aging +---------+---------------+---------+-----------+----------+--------------+ CFV      Full           Yes      Yes                                 +---------+---------------+---------+-----------+----------+--------------+ SFJ      Full                                                        +---------+---------------+---------+-----------+----------+--------------+ FV Prox  Full                                                        +---------+---------------+---------+-----------+----------+--------------+ FV Mid   Full                                                        +---------+---------------+---------+-----------+----------+--------------+ FV DistalFull                                                        +---------+---------------+---------+-----------+----------+--------------+ PFV      Full                                                        +---------+---------------+---------+-----------+----------+--------------+ POP      Full           Yes      Yes                                  +---------+---------------+---------+-----------+----------+--------------+ PTV      Full                                                        +---------+---------------+---------+-----------+----------+--------------+ PERO     Full                                                        +---------+---------------+---------+-----------+----------+--------------+ Gastroc  Full                                                        +---------+---------------+---------+-----------+----------+--------------+  ATV      Full                                                        +---------+---------------+---------+-----------+----------+--------------+    Summary: RIGHT: - No evidence of common femoral vein obstruction.  LEFT: - There is no evidence of deep vein thrombosis in the lower extremity.  - No cystic structure found in the popliteal fossa. Interstitial edema noted throughout the left calf.  *See table(s) above for measurements and observations.    Preliminary    DG Chest Portable 1 View  Result Date: 02/28/2022 CLINICAL DATA:  Chest pain EXAM: PORTABLE CHEST 1 VIEW COMPARISON:  02/25/2022 FINDINGS: Generous left ventricular size. Stable aortic and hilar contours. Aortic atherosclerosis. There is no edema, consolidation, effusion, or pneumothorax. Chronic elevation of the right diaphragm. Artifact from EKG leads. IMPRESSION: Stable exam.  No acute finding. Electronically Signed   By: Jorje Guild M.D.   On: 02/28/2022 09:09   CT Head Wo Contrast  Result Date: 02/25/2022 CLINICAL DATA:  Altered mental status EXAM: CT HEAD WITHOUT CONTRAST TECHNIQUE: Contiguous axial images were obtained from the base of the skull through the vertex without intravenous contrast. RADIATION DOSE REDUCTION: This exam was performed according to the departmental dose-optimization program which includes automated exposure control, adjustment of the mA and/or kV according to patient size and/or use of  iterative reconstruction technique. COMPARISON:  02/20/2022 FINDINGS: Brain: No evidence of acute infarction, hemorrhage, hydrocephalus, extra-axial collection or mass lesion/mass effect. Mild chronic white matter ischemic changes are noted. Mild atrophic changes are seen. Vascular: No hyperdense vessel or unexpected calcification. Skull: Normal. Negative for fracture or focal lesion. Sinuses/Orbits: No acute finding. Other: None. IMPRESSION: Chronic atrophic and ischemic changes without acute abnormality. Electronically Signed   By: Inez Catalina M.D.   On: 02/25/2022 22:26   DG Chest 2 View  Result Date: 02/25/2022 CLINICAL DATA:  Chest pain. EXAM: CHEST - 2 VIEW COMPARISON:  Chest radiograph dated 02/20/2022. FINDINGS: The heart size is normal. Vascular calcifications are seen in the aortic arch. Both lungs are clear. Degenerative changes are seen in the spine. IMPRESSION: No active cardiopulmonary disease. Aortic Atherosclerosis (ICD10-I70.0). Electronically Signed   By: Zerita Boers M.D.   On: 02/25/2022 15:59   { HEART Score (for undifferentiated chest pain): 3   Assessment and Plan:   Non cardiac chest pain  Ms. Monaco presents with recurrent abdominal discomfort that seems similar to prior episodes were thought to be either somatic symptoms or GI in origin.  Echo in October 2022 with normal EF and no wall motion maladies.  She has prior coronary evaluation with nonobstructive CAD.  Normal ECG and troponins.  No further cardiac evaluation currently indicated.   [facility] will sign off.   Medication Recommendations: none  Other recommendations (labs, testing, etc): none  Follow up as an outpatient: as scheduled  For questions or updates, please contact [facility] Please consult [email or web address] for contact info under   Signed, Dion Body, MD  03/01/2022 6:57 AM

## 2022-02-28 NOTE — ED Notes (Addendum)
Pt called out to report that her entire body feels like "acid is burning" from her tear ducts to her saliva, throughout her GI tract, and down to her toes. She says this happens on a near-daily basis and she sometimes takes tums or maalox with temporary relief, but this time seems worse. Will notify MD and await instruction.

## 2022-02-28 NOTE — Assessment & Plan Note (Signed)
Hold home Ozempic.  Placed on SSI.

## 2022-03-01 ENCOUNTER — Observation Stay (HOSPITAL_COMMUNITY): Payer: Medicare PPO

## 2022-03-01 DIAGNOSIS — R079 Chest pain, unspecified: Secondary | ICD-10-CM | POA: Diagnosis not present

## 2022-03-01 DIAGNOSIS — R0789 Other chest pain: Secondary | ICD-10-CM | POA: Diagnosis not present

## 2022-03-01 LAB — CBC
HCT: 26.9 % — ABNORMAL LOW (ref 36.0–46.0)
Hemoglobin: 8.8 g/dL — ABNORMAL LOW (ref 12.0–15.0)
MCH: 28.6 pg (ref 26.0–34.0)
MCHC: 32.7 g/dL (ref 30.0–36.0)
MCV: 87.3 fL (ref 80.0–100.0)
Platelets: 224 10*3/uL (ref 150–400)
RBC: 3.08 MIL/uL — ABNORMAL LOW (ref 3.87–5.11)
RDW: 13.2 % (ref 11.5–15.5)
WBC: 4.9 10*3/uL (ref 4.0–10.5)
nRBC: 0 % (ref 0.0–0.2)

## 2022-03-01 LAB — BASIC METABOLIC PANEL
Anion gap: 8 (ref 5–15)
BUN: 31 mg/dL — ABNORMAL HIGH (ref 8–23)
CO2: 24 mmol/L (ref 22–32)
Calcium: 8.7 mg/dL — ABNORMAL LOW (ref 8.9–10.3)
Chloride: 109 mmol/L (ref 98–111)
Creatinine, Ser: 2.96 mg/dL — ABNORMAL HIGH (ref 0.44–1.00)
GFR, Estimated: 16 mL/min — ABNORMAL LOW (ref >60)
Glucose, Bld: 151 mg/dL — ABNORMAL HIGH (ref 70–99)
Potassium: 4.6 mmol/L (ref 3.5–5.1)
Sodium: 141 mmol/L (ref 135–145)

## 2022-03-01 LAB — GLUCOSE, CAPILLARY: Glucose-Capillary: 108 mg/dL — ABNORMAL HIGH (ref 70–99)

## 2022-03-01 LAB — MAGNESIUM: Magnesium: 2.5 mg/dL — ABNORMAL HIGH (ref 1.7–2.4)

## 2022-03-01 MED ORDER — TECHNETIUM TO 99M ALBUMIN AGGREGATED
3.9000 | Freq: Once | INTRAVENOUS | Status: AC | PRN
  Administered 2022-03-01: 3.9 via INTRAVENOUS

## 2022-03-01 MED ORDER — FUROSEMIDE 20 MG PO TABS: 20.0000 mg | ORAL_TABLET | Freq: Every day | ORAL | Status: DC

## 2022-03-01 NOTE — Discharge Summary (Signed)
Discharge Summary  Teresa Poole:[id number] DOB: May 02, 1951  PCP: Teresa Coma., MD  Admit date: 02/28/2022 Discharge date: 03/01/2022    Time spent: . 29mns  Recommendations for Outpatient Follow-up:  F/u with PCP within a week  for hospital discharge follow up, repeat cbc/bmp at follow up. PCP to monitor memory issues, sister who is an attorney who is aware of patient's memory issue as well F/u with cardiology on 7/12 F/u with nephrology  Repeat cbc/bmp next week  Discharge Diagnoses:  Active Hospital Problems   Diagnosis Date Noted   Atypical chest pain 11/27/2018    Priority: 1.   Acute renal failure superimposed on stage 4 chronic kidney disease ([facility] 10/14/2021    Priority: 2.   (HFpEF) heart failure with preserved ejection fraction ([facility] 02/28/2022    Priority: 3.   Hypertension associated with diabetes ([facility] 09/07/2013    Priority: 4.   Type 2 diabetes mellitus with chronic kidney disease, without long-term current use of insulin ([facility] 09/07/2013    Priority: 6.   Hyperlipidemia associated with type 2 diabetes mellitus ([facility] 10/26/2013    Priority: 7.   Hypomagnesemia 02/28/2022   Anemia of chronic kidney failure, stage 4 (severe) ([facility] 09/24/2013    Resolved Hospital Problems  No resolved problems to display.    Discharge Condition: stable  Diet recommendation: heart healthy/carb modified  Filed Weights   02/28/22 0743 03/01/22 0500  Weight: 70.3 kg 69.5 kg    History of present illness: ( per admitting MD Dr Teresa Poole a 71y.o. female with medical history significant for CKD stage IV, HFpEF (EF improved to 55-60%, G1DD by TTE 06/11/2021), nonischemic cardiomyopathy, nonobstructive CAD, RBBB, left renal artery stenosis s/p stenting, T2DM, HTN, HLD, anemia of CKD, anxiety, memory issues who presented to the ED for evaluation of chest pain.  Patient reports several months of "sulfuric acid" burning sensation affecting her throat,  mid chest, and epigastric region usually occurring every morning when she is waking up.  Over the last week or so the symptoms have been fairly constant with no correlation to level activity or position.  She is also complaining of associated heavy pressure-like sensation on her chest.  She says previously she could take Tums and/or Mylanta with some relief of the symptoms however this is no longer the case.  She is also having generalized pruritus and states that she feels like her whole body is "on fire."  She has had associated intermittent nonproductive.  She denies any nausea, vomiting, dyspnea.  She reports regular bowel movements although diminished volume.  She reports good urine output.  She reports occasional aspirin use.  She denies any NSAID use.  Last cardiac catheterization in 2015 showed nonobstructive minimal CAD.   ED Course  Labs/Imaging on admission: I have personally reviewed following labs and imaging studies.   Initial vitals showed BP 97/55, pulse 56, RR 17, temp 97.7 F, SPO2 100% on room air.   Labs show WBC 5.5, hemoglobin 8.6, platelets 213,000, sodium 138, potassium 4.7, bicarb 24, BUN 36, creatinine 3.49 (variable baseline 2.2-3.0 on review of labs), magnesium 1.6, troponin 11x2, D-dimer 1.61.  Urinalysis negative for UTI.   Portable chest x-ray negative for focal consolidation, edema, effusion.  VQ scan ordered and pending.  Left lower extremity venous Doppler preliminary read negative for evidence of DVT.   Patient was given 500 cc normal saline, oral Pepcid and Maalox.  She was felt to have a heart score of 4.  EDP consulted on-call cardiology, Dr. Renella Cunas, who will see in consultation.  The hospitalist service was consulted to admit for further evaluation and management of chest pain.    Hospital Course:  Principal Problem:   Atypical chest pain Active Problems:   Acute renal failure superimposed on stage 4 chronic kidney disease (HCC)   (HFpEF) heart  failure with preserved ejection fraction (HCC)   Hypertension associated with diabetes (Teresa Poole)   Type 2 diabetes mellitus with chronic kidney disease, without long-term current use of insulin (HCC)   Hyperlipidemia associated with type 2 diabetes mellitus (HCC)   Anemia of chronic kidney failure, stage 4 (severe) (HCC)   Hypomagnesemia   Assessment and Plan:  * Atypical chest pain Mostly describing GI symptoms but also reporting heavy pressure sensation across her chest.  EKG without acute ischemic changes and troponin negative x2.  D-dimer mildly elevated however she has not been hypoxic or tachycardic.  Cardiac cath 2015 showed mild nonobstructive CAD. -seen by Cardiology who does not think symptom is cardiac related, she is to follow up cardiology next week on 7/12  (HFpEF) heart failure with preserved ejection fraction (HCC) Euvolemic to dry   Last EF 55-60%, G1 DD by TTE 06/11/2021. -Holding Lasix -Continue Coreg F/u with cardiology next week  Acute renal failure superimposed on stage 4 chronic kidney disease ([place]) Creatinine 3.49 on admission with variable recent baseline 2.2-3.0.  Transiently hypotensive on ED arrival. -appear dry, ua no bacteria, cr improved with holding lasix/lisinopril and gentle hydration -f/u with pcp/nephrology/cardiology -repeat bmp next week, hold lasix and lisinopril  Hypertension  Transiently hypotensive on ED arrival, reportedly after receiving nitroglycerin.  Improved after 500 cc NS bolus. - restart home Coreg and nifedipine , Holding lisinopril and lasix  Type 2 diabetes mellitus with chronic kidney disease, without long-term current use of insulin (HCC) No acute issues, continue home meds F/u with pcp  Hyperlipidemia associated with type 2 diabetes mellitus ([name]) Continue simvastatin.  Hypomagnesemia Replaced and improved   Anemia of chronic kidney failure, stage 4 (severe) (HCC) Hemoglobin 8.6, slightly decreased from baseline 9.2-10.0.   No obvious bleeding.   F/u with pcp  Impaired memory Lives by herself Patient  admits memory issues, she reports started write things down as reminders,  discussed with sister who is aware of her memory issues as well F/u with pcp  Discharge Exam: BP (!) 153/71 (BP Location: Right Arm)   Pulse 68   Temp 98.1 F (36.7 C) (Oral)   Resp 15   Ht '5\' 7"'$  (1.702 m)   Wt 69.5 kg   SpO2 99%   BMI 23.99 kg/m   General: NAD, impaired memory  Cardiovascular: RRR Respiratory: normal respiratory effort     Discharge Instructions     Diet - low sodium heart healthy   Complete by: As directed    Carb modified diet   Increase activity slowly   Complete by: As directed       Allergies as of 03/01/2022       Reactions   Hydralazine Hcl Itching   ENTIRE BODY = burning sensation, also in the eyes (they have become very sensitive to light)   Atorvastatin Other (See Comments)   Per MD - pt not sure of reaction         Medication List     STOP taking these medications    lisinopril 10 MG tablet Commonly known as: ZESTRIL   OLANZapine 2.5 MG tablet Commonly known as: ZyPREXA  TAKE these medications    albuterol 108 (90 Base) MCG/ACT inhaler Commonly known as: VENTOLIN HFA Inhale 2 puffs into the lungs every 4 (four) hours as needed for wheezing or shortness of breath.   carvedilol 25 MG tablet Commonly known as: COREG Take 25 mg by mouth 2 (two) times daily with a meal.   Cequa 0.09 % Soln Generic drug: cycloSPORINE (PF) Place 1 drop into both eyes 2 (two) times daily.   cetirizine 10 MG tablet Commonly known as: ZYRTEC Take 10 mg by mouth daily as needed for allergies.   famotidine 40 MG tablet Commonly known as: PEPCID Take 40 mg by mouth daily.   fluticasone 50 MCG/ACT nasal spray Commonly known as: FLONASE Place 2 sprays into both nostrils daily.   furosemide 20 MG tablet Commonly known as: LASIX Take 1 tablet (20 mg total) by mouth daily. Hold  this medication, further instruction defer to cardiology/nephrology What changed: additional instructions   glucose blood test strip Commonly known as: ONE TOUCH ULTRA TEST Use to check blood sugar 3 times daily Dx code E11.65 What changed:  how much to take how to take this when to take this   OneTouch Verio test strip Generic drug: glucose blood CHECK BLOOD SUGAR THREE TIMES DAILY What changed: See the new instructions.   meclizine 25 MG tablet Commonly known as: ANTIVERT Take 25 mg by mouth 3 (three) times daily as needed for dizziness.   NIFEdipine 60 MG 24 hr tablet Commonly known as: ADALAT CC Take 1 tablet (60 mg total) by mouth daily.   omeprazole 20 MG capsule Commonly known as: PRILOSEC Take 20 mg by mouth daily.   Ozempic (0.25 or 0.5 MG/DOSE) 2 MG/1.5ML Sopn Generic drug: Semaglutide(0.25 or 0.'5MG'$ /DOS) Inject 0.25 mg into the skin once a week. Sundays   polyethylene glycol 17 g packet Commonly known as: MIRALAX / GLYCOLAX Take 17 g by mouth daily.   rosuvastatin 20 MG tablet Commonly known as: CRESTOR Take 20 mg by mouth daily.       Allergies  Allergen Reactions   Hydralazine Hcl Itching    ENTIRE BODY = burning sensation, also in the eyes (they have become very sensitive to light)   Atorvastatin Other (See Comments)    Per MD - pt not sure of reaction     Follow-up Information     Teresa Coma., MD Follow up in 1 week(s).   Specialty: Internal Medicine Why: hospital discharge follow up Contact information: [address] [place] [address] [phone]         Teresa Booze, MD .   Specialties: Cardiology, Radiology, Interventional Cardiology Contact information: [address] [place] [address] [phone]         f/u with nephrology to monitor kidney function Follow up.                   The results of significant diagnostics from this hospitalization (including  imaging, microbiology, ancillary and laboratory) are listed below for reference.    Significant Diagnostic Studies: NM Pulmonary Perfusion  Result Date: 03/01/2022 CLINICAL DATA:  Chest pain.  Elevated D-dimer. EXAM: NUCLEAR MEDICINE PERFUSION LUNG SCAN TECHNIQUE: Perfusion images were obtained in multiple projections after intravenous injection of radiopharmaceutical. Ventilation scans intentionally deferred if perfusion scan and chest x-ray adequate for interpretation during COVID 19 epidemic. RADIOPHARMACEUTICALS:  3.9 mCi Tc-107mMAA IV COMPARISON:  Chest x-ray from yesterday. FINDINGS: Normal perfusion.  No wedge  shaped peripheral perfusion defects. IMPRESSION: No pulmonary embolism. Electronically Signed   By: Titus Dubin M.D.   On: 03/01/2022 10:22   VAS [place] LOWER EXTREMITY VENOUS (DVT)  Result Date: 02/28/2022  Lower Venous DVT Study Patient Name:  JACQUELINA HEWINS  Date of Exam:   02/28/2022 Medical Rec #: [id number]           Accession #:    [id number] Date of Birth: [birth year]           Patient Gender: F Patient Age:   71 years Exam Location:  [facility] Procedure:      VAS [place] LOWER EXTREMITY VENOUS (DVT) Referring Phys: Campbell Stall --------------------------------------------------------------------------------  Indications: Swelling.  Comparison Study: No prior study on file Performing Technologist: Sharion Dove RVS  Examination Guidelines: A complete evaluation includes B-mode imaging, spectral Doppler, color Doppler, and power Doppler as needed of all accessible portions of each vessel. Bilateral testing is considered an integral part of a complete examination. Limited examinations for reoccurring indications may be performed as noted. The reflux portion of the exam is performed with the patient in reverse Trendelenburg.  +-----+---------------+---------+-----------+----------+--------------+ RIGHTCompressibilityPhasicitySpontaneityPropertiesThrombus Aging  +-----+---------------+---------+-----------+----------+--------------+ CFV  Full           Yes      Yes                                 +-----+---------------+---------+-----------+----------+--------------+   +---------+---------------+---------+-----------+----------+--------------+ LEFT     CompressibilityPhasicitySpontaneityPropertiesThrombus Aging +---------+---------------+---------+-----------+----------+--------------+ CFV      Full           Yes      Yes                                 +---------+---------------+---------+-----------+----------+--------------+ SFJ      Full                                                        +---------+---------------+---------+-----------+----------+--------------+ FV Prox  Full                                                        +---------+---------------+---------+-----------+----------+--------------+ FV Mid   Full                                                        +---------+---------------+---------+-----------+----------+--------------+ FV DistalFull                                                        +---------+---------------+---------+-----------+----------+--------------+ PFV      Full                                                        +---------+---------------+---------+-----------+----------+--------------+  POP      Full           Yes      Yes                                 +---------+---------------+---------+-----------+----------+--------------+ PTV      Full                                                        +---------+---------------+---------+-----------+----------+--------------+ PERO     Full                                                        +---------+---------------+---------+-----------+----------+--------------+ Gastroc  Full                                                         +---------+---------------+---------+-----------+----------+--------------+ ATV      Full                                                        +---------+---------------+---------+-----------+----------+--------------+    Summary: RIGHT: - No evidence of common femoral vein obstruction.  LEFT: - There is no evidence of deep vein thrombosis in the lower extremity.  - No cystic structure found in the popliteal fossa. Interstitial edema noted throughout the left calf.  *See table(s) above for measurements and observations.    Preliminary    DG Chest Portable 1 View  Result Date: 02/28/2022 CLINICAL DATA:  Chest pain EXAM: PORTABLE CHEST 1 VIEW COMPARISON:  02/25/2022 FINDINGS: Generous left ventricular size. Stable aortic and hilar contours. Aortic atherosclerosis. There is no edema, consolidation, effusion, or pneumothorax. Chronic elevation of the right diaphragm. Artifact from EKG leads. IMPRESSION: Stable exam.  No acute finding. Electronically Signed   By: Jorje Guild M.D.   On: 02/28/2022 09:09   CT Head Wo Contrast  Result Date: 02/25/2022 CLINICAL DATA:  Altered mental status EXAM: CT HEAD WITHOUT CONTRAST TECHNIQUE: Contiguous axial images were obtained from the base of the skull through the vertex without intravenous contrast. RADIATION DOSE REDUCTION: This exam was performed according to the departmental dose-optimization program which includes automated exposure control, adjustment of the mA and/or kV according to patient size and/or use of iterative reconstruction technique. COMPARISON:  02/20/2022 FINDINGS: Brain: No evidence of acute infarction, hemorrhage, hydrocephalus, extra-axial collection or mass lesion/mass effect. Mild chronic white matter ischemic changes are noted. Mild atrophic changes are seen. Vascular: No hyperdense vessel or unexpected calcification. Skull: Normal. Negative for fracture or focal lesion. Sinuses/Orbits: No acute finding. Other: None. IMPRESSION: Chronic  atrophic and ischemic changes without acute abnormality. Electronically Signed   By: Inez Catalina M.D.   On: 02/25/2022 22:26   DG Chest 2 View  Result Date: 02/25/2022 CLINICAL DATA:  Chest pain. EXAM: CHEST - 2 VIEW COMPARISON:  Chest radiograph dated 02/20/2022. FINDINGS: The heart size is normal. Vascular calcifications are seen in the aortic arch. Both lungs are clear. Degenerative changes are seen in the spine. IMPRESSION: No active cardiopulmonary disease. Aortic Atherosclerosis (ICD10-I70.0). Electronically Signed   By: Zerita Boers M.D.   On: 02/25/2022 15:59   CT HEAD WO CONTRAST (5MM)  Result Date: 02/20/2022 CLINICAL DATA:  Mental status change, unknown cause Syncope/presyncope, cerebrovascular cause suspected EXAM: CT HEAD WITHOUT CONTRAST TECHNIQUE: Contiguous axial images were obtained from the base of the skull through the vertex without intravenous contrast. RADIATION DOSE REDUCTION: This exam was performed according to the departmental dose-optimization program which includes automated exposure control, adjustment of the mA and/or kV according to patient size and/or use of iterative reconstruction technique. COMPARISON:  Head CT 11/29/2021, brain MRI 08/29/2021 FINDINGS: Brain: No intracranial hemorrhage, mass effect, or midline shift. Normal brain volume for age. No hydrocephalus. The basilar cisterns are patent. No evidence of territorial infarct or acute ischemia. Similar degree of minimal chronic small vessel ischemia. 5No extra-axial or intracranial fluid collection. Vascular: Atherosclerosis of skullbase vasculature without hyperdense vessel or abnormal calcification. Skull: No fracture or focal lesion. Sinuses/Orbits: Paranasal sinuses and mastoid air cells are clear. The visualized orbits are unremarkable. Other: None. IMPRESSION: No acute intracranial abnormality. Unremarkable noncontrast head CT for age. Electronically Signed   By: Keith Rake M.D.   On: 02/20/2022 18:11    DG Chest 2 View  Result Date: 02/20/2022 CLINICAL DATA:  Shortness of breath and chest pressure EXAM: CHEST - 2 VIEW COMPARISON:  Chest x-ray dated Dec 28, 2021 FINDINGS: The heart size and mediastinal contours are within normal limits. Both lungs are clear. The visualized skeletal structures are unremarkable. IMPRESSION: No active cardiopulmonary disease. Electronically Signed   By: Yetta Glassman M.D.   On: 02/20/2022 17:20    Microbiology: No results found for this or any previous visit (from the past 240 hour(s)).   Labs: Basic Metabolic Panel: Recent Labs  Lab 02/25/22 1525 02/28/22 0832 03/01/22 0117  NA 140 138 141  K 5.1 4.7 4.6  CL 106 106 109  CO2 '26 24 24  '$ GLUCOSE 122* 179* 151*  BUN 40* 36* 31*  CREATININE 3.00* 3.49* 2.96*  CALCIUM 9.2 8.5* 8.7*  MG  --  1.6* 2.5*   Liver Function Tests: Recent Labs  Lab 02/25/22 1525 02/28/22 0832  AST 19 19  ALT 13 13  ALKPHOS 46 44  BILITOT 0.5 0.5  PROT 6.6 5.8*  ALBUMIN 3.6 3.3*   Recent Labs  Lab 02/25/22 1525  LIPASE 42   Recent Labs  Lab 02/25/22 2113  AMMONIA 18   CBC: Recent Labs  Lab 02/25/22 1525 02/28/22 0832 03/01/22 0117  WBC 5.2 5.5 4.9  NEUTROABS 3.3 2.9  --   HGB 10.1* 8.6* 8.8*  HCT 31.6* 26.7* 26.9*  MCV 89.3 89.3 87.3  PLT 252 213 224   Cardiac Enzymes: No results for input(s): "CKTOTAL", "CKMB", "CKMBINDEX", "TROPONINI" in the last 168 hours. BNP: BNP (last 3 results) Recent Labs    06/11/21 0342  BNP 23.1    ProBNP (last 3 results) No results for input(s): "PROBNP" in the last 8760 hours.  CBG: Recent Labs  Lab 02/25/22 2053 02/28/22 2235 03/01/22 0755  GLUCAP 104* 195* 108*    FURTHER DISCHARGE INSTRUCTIONS:   Get Medicines reviewed and adjusted: Please take all your medications with you for your next visit with your  Primary MD   Laboratory/radiological data: Please request your Primary MD to go over all hospital tests and procedure/radiological results at  the follow up, please ask your Primary MD to get all Hospital records sent to his/her office.   In some cases, they will be blood work, cultures and biopsy results pending at the time of your discharge. Please request that your primary care M.D. goes through all the records of your hospital data and follows up on these results.   Also Note the following: If you experience worsening of your admission symptoms, develop shortness of breath, life threatening emergency, suicidal or homicidal thoughts you must seek medical attention immediately by calling 911 or calling your MD immediately  if symptoms less severe.   You must read complete instructions/literature along with all the possible adverse reactions/side effects for all the Medicines you take and that have been prescribed to you. Take any new Medicines after you have completely understood and accpet all the possible adverse reactions/side effects.    Do not drive when taking Pain medications or sleeping medications (Benzodaizepines)   Do not take more than prescribed Pain, Sleep and Anxiety Medications. It is not advisable to combine anxiety,sleep and pain medications without talking with your primary care practitioner   Special Instructions: If you have smoked or chewed Tobacco  in the last 2 yrs please stop smoking, stop any regular Alcohol  and or any Recreational drug use.   Wear Seat belts while driving.   Please note: You were cared for by a hospitalist during your hospital stay. Once you are discharged, your primary care physician will handle any further medical issues. Please note that NO REFILLS for any discharge medications will be authorized once you are discharged, as it is imperative that you return to your primary care physician (or establish a relationship with a primary care physician if you do not have one) for your post hospital discharge needs so that they can reassess your need for medications and monitor your lab values.      Signed:  Florencia Reasons MD, PhD, FACP  [facility] 03/01/2022, 11:59 AM

## 2022-03-01 NOTE — Progress Notes (Signed)
Patient seen and examined.  Agree with cardiology overnight fellow's assessment.  She is not having any current chest pain or shortness of breath.  She feels that all of her symptoms are GI related.  Troponins negative x2.  Likely noncardiac cause of her symptoms.  Cardiology to sign off.  Allegra Lai, MD

## 2022-03-01 NOTE — Care Management Obs Status (Addendum)
[facility] NOTIFICATION   Patient Details  Name: Teresa Poole MRN: [id number] Date of Birth: 01-04-1951   Medicare Observation Status Notification Given:  Yes    Kasidi Shanker G., RN 03/01/2022, 10:00 AM

## 2022-03-01 NOTE — Care Management Obs Status (Signed)
[facility] NOTIFICATION   Patient Details  Name: HOUSTON SURGES MRN: [id number] Date of Birth: 1950-11-08   Medicare Observation Status Notification Given:  Yes    Nivin Braniff G., RN 03/01/2022, 10:00 AM

## 2022-03-02 NOTE — ED Provider Notes (Signed)
  Physical Exam  BP (!) 153/71 (BP Location: Right Arm)   Pulse 68   Temp 98.1 F (36.7 C) (Oral)   Resp 15   Ht '5\' 7"'$  (1.702 m)   Wt 69.5 kg   SpO2 99%   BMI 23.99 kg/m   Physical Exam Vitals and nursing note reviewed.  Constitutional:      General: She is not in acute distress.    Appearance: She is well-developed.  HENT:     Head: Normocephalic and atraumatic.  Eyes:     Conjunctiva/sclera: Conjunctivae normal.  Cardiovascular:     Rate and Rhythm: Normal rate and regular rhythm.     Heart sounds: No murmur heard. Pulmonary:     Effort: Pulmonary effort is normal. No respiratory distress.     Breath sounds: Normal breath sounds.  Abdominal:     Palpations: Abdomen is soft.     Tenderness: There is no abdominal tenderness.  Musculoskeletal:        General: No swelling.     Cervical back: Neck supple.  Skin:    General: Skin is warm and dry.     Capillary Refill: Capillary refill takes less than 2 seconds.  Neurological:     Mental Status: She is alert.  Psychiatric:        Mood and Affect: Mood normal.     Procedures  Procedures  ED Course / MDM   Clinical Course as of 03/02/22 1549  Sat Feb 28, 2022  1528 Pending V/Q scan, will come in for moderate risk CP [MK]  1529 Heart score 6 [MK]    Clinical Course User Index [MK] Teresa Mullane, MD   Medical Decision Making Amount and/or Complexity of Data Reviewed Labs: ordered. Radiology: ordered.  Risk OTC drugs. Prescription drug management. Decision regarding hospitalization.   Patient received in handoff.  Chest pain concerning for moderate risk ACS versus PE.  Positive D-dimer.  GFR too low for CT PE and patient pending VQ scan.  After extended stay waiting for radiology tech to perform the scan, it appears that this scan will not be happening until tomorrow.  Patient admitted for moderate chest pain and need for VQ scan       Teresa Lower, MD 03/02/22 1550

## 2022-03-04 ENCOUNTER — Ambulatory Visit: Payer: Medicare PPO | Admitting: Physician Assistant

## 2022-03-07 ENCOUNTER — Emergency Department (HOSPITAL_COMMUNITY): Payer: Medicare PPO

## 2022-03-07 ENCOUNTER — Emergency Department (HOSPITAL_COMMUNITY)
Admission: EM | Admit: 2022-03-07 | Discharge: 2022-03-07 | Disposition: A | Discharge status: Home / Self Care | Payer: Medicare PPO | Attending: Emergency Medicine | Admitting: Emergency Medicine

## 2022-03-07 ENCOUNTER — Encounter (HOSPITAL_COMMUNITY): Payer: Self-pay | Admitting: Emergency Medicine

## 2022-03-07 DIAGNOSIS — E039 Hypothyroidism, unspecified: Secondary | ICD-10-CM | POA: Diagnosis not present

## 2022-03-07 DIAGNOSIS — E1122 Type 2 diabetes mellitus with diabetic chronic kidney disease: Secondary | ICD-10-CM | POA: Diagnosis not present

## 2022-03-07 DIAGNOSIS — I2510 Atherosclerotic heart disease of native coronary artery without angina pectoris: Secondary | ICD-10-CM | POA: Diagnosis not present

## 2022-03-07 DIAGNOSIS — I509 Heart failure, unspecified: Secondary | ICD-10-CM | POA: Insufficient documentation

## 2022-03-07 DIAGNOSIS — N189 Chronic kidney disease, unspecified: Secondary | ICD-10-CM | POA: Insufficient documentation

## 2022-03-07 DIAGNOSIS — R0602 Shortness of breath: Secondary | ICD-10-CM | POA: Diagnosis present

## 2022-03-07 DIAGNOSIS — Z794 Long term (current) use of insulin: Secondary | ICD-10-CM | POA: Insufficient documentation

## 2022-03-07 DIAGNOSIS — R079 Chest pain, unspecified: Secondary | ICD-10-CM | POA: Diagnosis not present

## 2022-03-07 DIAGNOSIS — I130 Hypertensive heart and chronic kidney disease with heart failure and stage 1 through stage 4 chronic kidney disease, or unspecified chronic kidney disease: Secondary | ICD-10-CM | POA: Diagnosis not present

## 2022-03-07 DIAGNOSIS — Z79899 Other long term (current) drug therapy: Secondary | ICD-10-CM | POA: Diagnosis not present

## 2022-03-07 LAB — CBC WITH DIFFERENTIAL/PLATELET
Abs Immature Granulocytes: 0.02 10*3/uL (ref 0.00–0.07)
Basophils Absolute: 0 10*3/uL (ref 0.0–0.1)
Basophils Relative: 0 %
Eosinophils Absolute: 0.1 10*3/uL (ref 0.0–0.5)
Eosinophils Relative: 2 %
HCT: 31.6 % — ABNORMAL LOW (ref 36.0–46.0)
Hemoglobin: 10.1 g/dL — ABNORMAL LOW (ref 12.0–15.0)
Immature Granulocytes: 0 %
Lymphocytes Relative: 28 %
Lymphs Abs: 1.7 10*3/uL (ref 0.7–4.0)
MCH: 28.4 pg (ref 26.0–34.0)
MCHC: 32 g/dL (ref 30.0–36.0)
MCV: 88.8 fL (ref 80.0–100.0)
Monocytes Absolute: 0.4 10*3/uL (ref 0.1–1.0)
Monocytes Relative: 6 %
Neutro Abs: 3.9 10*3/uL (ref 1.7–7.7)
Neutrophils Relative %: 64 %
Platelets: 257 10*3/uL (ref 150–400)
RBC: 3.56 MIL/uL — ABNORMAL LOW (ref 3.87–5.11)
RDW: 13.6 % (ref 11.5–15.5)
WBC: 6.1 10*3/uL (ref 4.0–10.5)
nRBC: 0 % (ref 0.0–0.2)

## 2022-03-07 LAB — TROPONIN I (HIGH SENSITIVITY)
Troponin I (High Sensitivity): 13 ng/L (ref <18)
Troponin I (High Sensitivity): 13 ng/L (ref <18)

## 2022-03-07 LAB — BASIC METABOLIC PANEL
Anion gap: 10 (ref 5–15)
BUN: 38 mg/dL — ABNORMAL HIGH (ref 8–23)
CO2: 25 mmol/L (ref 22–32)
Calcium: 9.4 mg/dL (ref 8.9–10.3)
Chloride: 107 mmol/L (ref 98–111)
Creatinine, Ser: 2.87 mg/dL — ABNORMAL HIGH (ref 0.44–1.00)
GFR, Estimated: 17 mL/min — ABNORMAL LOW (ref >60)
Glucose, Bld: 148 mg/dL — ABNORMAL HIGH (ref 70–99)
Potassium: 4.5 mmol/L (ref 3.5–5.1)
Sodium: 142 mmol/L (ref 135–145)

## 2022-03-07 LAB — TSH: TSH: 1.204 u[IU]/mL (ref 0.350–4.500)

## 2022-03-07 LAB — BRAIN NATRIURETIC PEPTIDE: B Natriuretic Peptide: 27.6 pg/mL (ref 0.0–100.0)

## 2022-03-07 MED ORDER — OXYMETAZOLINE HCL 0.05 % NA SOLN
1.0000 | Freq: Once | NASAL | Status: AC
  Administered 2022-03-07: 1 via NASAL
  Filled 2022-03-07: qty 30

## 2022-03-07 MED ORDER — FLUTICASONE PROPIONATE 50 MCG/ACT NA SUSP
1.0000 | Freq: Every day | NASAL | Status: DC
Start: 2022-03-07 — End: 2022-03-07
  Administered 2022-03-07: 1 via NASAL
  Filled 2022-03-07: qty 16

## 2022-03-07 NOTE — ED Provider Notes (Signed)
[facility] EMERGENCY DEPARTMENT Provider Note   CSN: [id number] Arrival date & time: 03/07/22  1110     History  Chief Complaint  Patient presents with   Chest Pain    Teresa Poole is a 71 y.o. female.  Pt is a 79 female with a pmhx significant for htn, dm2, chf, CAD, carotid artery stenosis, HLD, GERD, hypothyroidism, and CKD.  Pt said she felt sob this am.  She took her medications and thought she would be ok.  She went to the [employer] and felt worse, so she called EMS.  Pt is not sure if it is seasonal allergies, or something worse.       Home Medications Prior to Admission medications   Medication Sig Start Date End Date Taking? Authorizing Provider  albuterol (VENTOLIN HFA) 108 (90 Base) MCG/ACT inhaler Inhale 2 puffs into the lungs every 4 (four) hours as needed for wheezing or shortness of breath. Patient not taking: Reported on 02/28/2022 10/23/21   [provider]  carvedilol (COREG) 25 MG tablet Take 25 mg by mouth 2 (two) times daily with a meal.    [provider]  cetirizine (ZYRTEC) 10 MG tablet Take 10 mg by mouth daily as needed for allergies.    [provider]  cycloSPORINE, PF, (CEQUA) 0.09 % SOLN Place 1 drop into both eyes 2 (two) times daily. 12/18/20   [provider]  famotidine (PEPCID) 40 MG tablet Take 40 mg by mouth daily. 09/04/21   [provider]  fluticasone (FLONASE) 50 MCG/ACT nasal spray Place 2 sprays into both nostrils daily.    [provider]  furosemide (LASIX) 20 MG tablet Take 1 tablet (20 mg total) by mouth daily. Hold this medication, further instruction defer to cardiology/nephrology 03/01/22   Florencia Reasons, MD  glucose blood (ONE TOUCH ULTRA TEST) test strip Use to check blood sugar 3 times daily Dx code E11.65 Patient taking differently: 1 each by Other route See admin instructions. Use to check blood sugar 3 times daily Dx code E11.65 11/20/14   Elayne Snare, MD   meclizine (ANTIVERT) 25 MG tablet Take 25 mg by mouth 3 (three) times daily as needed for dizziness.    [provider]  NIFEdipine (ADALAT CC) 60 MG 24 hr tablet Take 1 tablet (60 mg total) by mouth daily. 07/09/21   Daleen Bo, MD  omeprazole (PRILOSEC) 20 MG capsule Take 20 mg by mouth daily.    [provider]  ONETOUCH VERIO test strip [place] Patient taking differently: 1 each by Other route in the morning, at noon, and at bedtime. 03/30/18   Elayne Snare, MD  OZEMPIC, 0.25 OR 0.5 MG/DOSE, 2 MG/1.5ML SOPN Inject 0.25 mg into the skin once a week. Sundays 05/12/21   [provider]  polyethylene glycol (MIRALAX / GLYCOLAX) 17 g packet Take 17 g by mouth daily. Patient not taking: Reported on 10/25/2021 09/28/21   Eugenie Filler, MD  rosuvastatin (CRESTOR) 20 MG tablet Take 20 mg by mouth daily.    [provider]      Allergies    Hydralazine hcl and Atorvastatin    Review of Systems   Review of Systems  Respiratory:  Positive for shortness of breath.   All other systems reviewed and are negative.   Physical Exam Updated Vital Signs BP (!) 159/75   Pulse 73   Temp 98.5 F (36.9 C) (Oral)   Resp 14  SpO2 99%  Physical Exam Vitals and nursing note reviewed.  Constitutional:      Appearance: She is well-developed.  HENT:     Head: Normocephalic and atraumatic.  Eyes:     Extraocular Movements: Extraocular movements intact.     Pupils: Pupils are equal, round, and reactive to light.  Cardiovascular:     Rate and Rhythm: Normal rate and regular rhythm.     Heart sounds: Normal heart sounds.  Pulmonary:     Effort: Pulmonary effort is normal.     Breath sounds: Normal breath sounds.  Abdominal:     General: Bowel sounds are normal.     Palpations: Abdomen is soft.  Musculoskeletal:        General: Normal range of motion.     Cervical back: Normal range of motion and neck supple.  Skin:    General: Skin  is warm.     Capillary Refill: Capillary refill takes less than 2 seconds.  Neurological:     General: No focal deficit present.     Mental Status: She is alert and oriented to person, place, and time.  Psychiatric:        Mood and Affect: Mood normal.        Behavior: Behavior normal.     ED Results / Procedures / Treatments   Labs (all labs ordered are listed, but only abnormal results are displayed) Labs Reviewed  BASIC METABOLIC PANEL - Abnormal; Notable for the following components:      Result Value   Glucose, Bld 148 (*)    BUN 38 (*)    Creatinine, Ser 2.87 (*)    GFR, Estimated 17 (*)    All other components within normal limits  CBC WITH DIFFERENTIAL/PLATELET - Abnormal; Notable for the following components:   RBC 3.56 (*)    Hemoglobin 10.1 (*)    HCT 31.6 (*)    All other components within normal limits  BRAIN NATRIURETIC PEPTIDE  TSH  TROPONIN I (HIGH SENSITIVITY)  TROPONIN I (HIGH SENSITIVITY)    EKG EKG Interpretation  Date/Time:  Saturday March 07 2022 12:19:38 EDT Ventricular Rate:  70 PR Interval:  191 QRS Duration: 169 QT Interval:  445 QTC Calculation: 481 R Axis:   -26 Text Interpretation: Sinus rhythm Right bundle branch block Probable left ventricular hypertrophy No significant change since last tracing Confirmed by Isla Pence [phone]) on 03/07/2022 12:53:55 PM  Radiology DG Chest 1 View  Result Date: 03/07/2022 CLINICAL DATA:  Shortness of breath.  Chest pain. EXAM: CHEST  1 VIEW COMPARISON:  None Available. FINDINGS: Heart is upper limits of normal for size, stable. No edema or effusion is present. No focal airspace disease is present. Visualized soft tissues and bony thorax are unremarkable. IMPRESSION: No acute cardiopulmonary disease or significant interval change. Electronically Signed   By: San Morelle M.D.   On: 03/07/2022 12:17    Procedures Procedures    Medications Ordered in ED Medications  fluticasone (FLONASE) 50  MCG/ACT nasal spray 1 spray (1 spray Each Nare Given 03/07/22 1311)  oxymetazoline (AFRIN) 0.05 % nasal spray 1 spray (1 spray Each Nare Given 03/07/22 1311)    ED Course/ Medical Decision Making/ A&P                           Medical Decision Making Amount and/or Complexity of Data Reviewed Labs: ordered. Radiology: ordered.  Risk OTC drugs.   This patient presents to  the ED for concern of sob, this involves an extensive number of treatment options, and is a complaint that carries with it a high risk of complications and morbidity.  The differential diagnosis includes chf exac, copd exac, pna, uri, allergies, electrolyte abn   Co morbidities that complicate the patient evaluation  htn, dm2, chf, CAD, carotid artery stenosis, HLD, GERD, hypothyroidism, and CKD   Additional history obtained:  Additional history obtained from epic chart review External records from outside source obtained and reviewed including EMS report   Lab Tests:  I Ordered, and personally interpreted labs.  The pertinent results include:  cbc with hgb 10.1 (chronic); bmp with cr 2.87 (chronic).  2 troponins neg.   Imaging Studies ordered:  I ordered imaging studies including CXR  I independently visualized and interpreted imaging which showed  IMPRESSION:  No acute cardiopulmonary disease or significant interval change.   I agree with the radiologist interpretation   Cardiac Monitoring:  The patient was maintained on a cardiac monitor.  I personally viewed and interpreted the cardiac monitored which showed an underlying rhythm of: nsr   Medicines ordered and prescription drug management:  I ordered medication including afrin and flonase  for sinus sx  Reevaluation of the patient after these medicines showed that the patient improved I have reviewed the patients home medicines and have made adjustments as needed   Problem List / ED Course:  Sinus congestion:  improved with meds Mild sob:   likely due to the sinus sx.  CXR clear.  Labs unchanged.  Pt is to return if worse.  F/u with pcp.   Reevaluation:  After the interventions noted above, I reevaluated the patient and found that they have :improved   Social Determinants of Health:  Lives at home   Dispostion:  After consideration of the diagnostic results and the patients response to treatment, I feel that the patent would benefit from discharge with f/.          Final Clinical Impression(s) / ED Diagnoses Final diagnoses:  SOB (shortness of breath)    Rx / DC Orders ED Discharge Orders     None         Isla Pence, MD 03/07/22 1514

## 2022-03-07 NOTE — ED Triage Notes (Signed)
Pt complaining of rhinitis and seasonal allergies. She went to [employer] and they got worse so she called EMS. No SOB noted or other signs of distress.

## 2022-03-07 NOTE — Discharge Instructions (Addendum)
Take the oxymetazoline (Afrin) twice a day for 3 days only.  Take the fluticasone (Flonase) once a day for allergy symptoms.  It is also ok to use saline nasal spray to keep the nose moist and to flush out allergens.  Return if worse.  Follow up with your PCP.

## 2022-03-07 NOTE — ED Notes (Signed)
RN called to see about trops

## 2022-04-15 ENCOUNTER — Emergency Department (HOSPITAL_COMMUNITY)
Admission: EM | Admit: 2022-04-15 | Discharge: 2022-04-16 | Disposition: A | Discharge status: Home / Self Care | Payer: Medicare PPO | Attending: Emergency Medicine | Admitting: Emergency Medicine

## 2022-04-15 ENCOUNTER — Encounter (HOSPITAL_COMMUNITY): Payer: Self-pay

## 2022-04-15 ENCOUNTER — Emergency Department (HOSPITAL_COMMUNITY): Payer: Medicare PPO

## 2022-04-15 ENCOUNTER — Other Ambulatory Visit: Payer: Self-pay

## 2022-04-15 DIAGNOSIS — I130 Hypertensive heart and chronic kidney disease with heart failure and stage 1 through stage 4 chronic kidney disease, or unspecified chronic kidney disease: Secondary | ICD-10-CM | POA: Diagnosis not present

## 2022-04-15 DIAGNOSIS — Z8543 Personal history of malignant neoplasm of ovary: Secondary | ICD-10-CM | POA: Diagnosis not present

## 2022-04-15 DIAGNOSIS — N189 Chronic kidney disease, unspecified: Secondary | ICD-10-CM | POA: Diagnosis not present

## 2022-04-15 DIAGNOSIS — E119 Type 2 diabetes mellitus without complications: Secondary | ICD-10-CM | POA: Insufficient documentation

## 2022-04-15 DIAGNOSIS — Z79899 Other long term (current) drug therapy: Secondary | ICD-10-CM | POA: Diagnosis not present

## 2022-04-15 DIAGNOSIS — I509 Heart failure, unspecified: Secondary | ICD-10-CM | POA: Insufficient documentation

## 2022-04-15 DIAGNOSIS — R079 Chest pain, unspecified: Secondary | ICD-10-CM

## 2022-04-15 DIAGNOSIS — M542 Cervicalgia: Secondary | ICD-10-CM | POA: Diagnosis not present

## 2022-04-15 DIAGNOSIS — I2510 Atherosclerotic heart disease of native coronary artery without angina pectoris: Secondary | ICD-10-CM | POA: Insufficient documentation

## 2022-04-15 DIAGNOSIS — R600 Localized edema: Secondary | ICD-10-CM | POA: Diagnosis not present

## 2022-04-15 DIAGNOSIS — K219 Gastro-esophageal reflux disease without esophagitis: Secondary | ICD-10-CM | POA: Insufficient documentation

## 2022-04-15 DIAGNOSIS — I10 Essential (primary) hypertension: Secondary | ICD-10-CM

## 2022-04-15 DIAGNOSIS — Z794 Long term (current) use of insulin: Secondary | ICD-10-CM | POA: Diagnosis not present

## 2022-04-15 LAB — CBC
HCT: 30.4 % — ABNORMAL LOW (ref 36.0–46.0)
Hemoglobin: 9.6 g/dL — ABNORMAL LOW (ref 12.0–15.0)
MCH: 29.3 pg (ref 26.0–34.0)
MCHC: 31.6 g/dL (ref 30.0–36.0)
MCV: 92.7 fL (ref 80.0–100.0)
Platelets: 283 10*3/uL (ref 150–400)
RBC: 3.28 MIL/uL — ABNORMAL LOW (ref 3.87–5.11)
RDW: 14.6 % (ref 11.5–15.5)
WBC: 5.8 10*3/uL (ref 4.0–10.5)
nRBC: 0 % (ref 0.0–0.2)

## 2022-04-15 LAB — COMPREHENSIVE METABOLIC PANEL
ALT: 17 U/L (ref 0–44)
AST: 21 U/L (ref 15–41)
Albumin: 3.9 g/dL (ref 3.5–5.0)
Alkaline Phosphatase: 48 U/L (ref 38–126)
Anion gap: 9 (ref 5–15)
BUN: 46 mg/dL — ABNORMAL HIGH (ref 8–23)
CO2: 23 mmol/L (ref 22–32)
Calcium: 9.1 mg/dL (ref 8.9–10.3)
Chloride: 111 mmol/L (ref 98–111)
Creatinine, Ser: 3.39 mg/dL — ABNORMAL HIGH (ref 0.44–1.00)
GFR, Estimated: 14 mL/min — ABNORMAL LOW (ref >60)
Glucose, Bld: 96 mg/dL (ref 70–99)
Potassium: 4.7 mmol/L (ref 3.5–5.1)
Sodium: 143 mmol/L (ref 135–145)
Total Bilirubin: 0.3 mg/dL (ref 0.3–1.2)
Total Protein: 7.4 g/dL (ref 6.5–8.1)

## 2022-04-15 LAB — TROPONIN I (HIGH SENSITIVITY)
Troponin I (High Sensitivity): 11 ng/L (ref <18)
Troponin I (High Sensitivity): 11 ng/L (ref <18)

## 2022-04-15 MED ORDER — ALUM & MAG HYDROXIDE-SIMETH 200-200-20 MG/5ML PO SUSP
30.0000 mL | Freq: Once | ORAL | Status: AC
  Administered 2022-04-15: 30 mL via ORAL
  Filled 2022-04-15: qty 30

## 2022-04-15 MED ORDER — AMLODIPINE BESYLATE 5 MG PO TABS
5.0000 mg | ORAL_TABLET | Freq: Every day | ORAL | Status: DC
  Administered 2022-04-15: 5 mg via ORAL
  Filled 2022-04-15: qty 1

## 2022-04-15 NOTE — ED Provider Notes (Signed)
[place] DEPT Provider Note   CSN: [id number] Arrival date & time: 04/15/22  1638     History  Chief Complaint  Patient presents with   Neck Pain   Chest Pain    Teresa Poole is a 71 y.o. female.   Neck Pain Associated symptoms: chest pain   Chest Pain    Patient has history of diabetes, hypertension, history of ovarian cancer, CHF, mild nonobstructive coronary artery disease, carotid stenosis, hyperlipidemia, chronic kidney disease.  Patient presents to the ED for evaluation of chest pressure.  Patient states she noticed it today.  It occurred while she was out doing errands in the morning.  She developed a burning discomfort in the center of her chest.  Patient also states she has had a pressure discomfort in her chest.  She is also had some sharp pain in her neck.  Patient also states she feels like she might have a head cold.  She feels like it is hard to breathe through her nose.  She has not been coughing.  She has not had any fevers.  Home Medications Prior to Admission medications   Medication Sig Start Date End Date Taking? Authorizing Provider  albuterol (VENTOLIN HFA) 108 (90 Base) MCG/ACT inhaler Inhale 2 puffs into the lungs every 4 (four) hours as needed for wheezing or shortness of breath. Patient not taking: Reported on 02/28/2022 10/23/21   [provider]  carvedilol (COREG) 25 MG tablet Take 25 mg by mouth 2 (two) times daily with a meal.    [provider]  cetirizine (ZYRTEC) 10 MG tablet Take 10 mg by mouth daily as needed for allergies.    [provider]  cycloSPORINE, PF, (CEQUA) 0.09 % SOLN Place 1 drop into both eyes 2 (two) times daily. 12/18/20   [provider]  famotidine (PEPCID) 40 MG tablet Take 40 mg by mouth daily. 09/04/21   [provider]  fluticasone (FLONASE) 50 MCG/ACT nasal spray Place 2 sprays into both nostrils daily.    [provider]  furosemide  (LASIX) 20 MG tablet Take 1 tablet (20 mg total) by mouth daily. Hold this medication, further instruction defer to cardiology/nephrology 03/01/22   Florencia Reasons, MD  glucose blood (ONE TOUCH ULTRA TEST) test strip Use to check blood sugar 3 times daily Dx code E11.65 Patient taking differently: 1 each by Other route See admin instructions. Use to check blood sugar 3 times daily Dx code E11.65 11/20/14   Elayne Snare, MD  lisinopril (ZESTRIL) 40 MG tablet Take 40 mg by mouth daily. 03/05/22   [provider]  meclizine (ANTIVERT) 25 MG tablet Take 25 mg by mouth 3 (three) times daily as needed for dizziness.    [provider]  NIFEdipine (ADALAT CC) 60 MG 24 hr tablet Take 1 tablet (60 mg total) by mouth daily. 07/09/21   Daleen Bo, MD  omeprazole (PRILOSEC) 20 MG capsule Take 20 mg by mouth daily.    [provider]  omeprazole (PRILOSEC) 40 MG capsule Take 40 mg by mouth daily. 03/10/22   [provider]  ONETOUCH VERIO test strip [place] Patient taking differently: 1 each by Other route in the morning, at noon, and at bedtime. 03/30/18   Elayne Snare, MD  OZEMPIC, 0.25 OR 0.5 MG/DOSE, 2 MG/1.5ML SOPN Inject 0.25 mg into the skin once a week. Sundays 05/12/21   [provider]  polyethylene glycol (MIRALAX / GLYCOLAX) 17  g packet Take 17 g by mouth daily. Patient not taking: Reported on 10/25/2021 09/28/21   Eugenie Filler, MD  rosuvastatin (CRESTOR) 20 MG tablet Take 20 mg by mouth daily.    [provider]      Allergies    Hydralazine hcl and Atorvastatin    Review of Systems   Review of Systems  Cardiovascular:  Positive for chest pain.  Musculoskeletal:  Positive for neck pain.    Physical Exam Updated Vital Signs BP (!) 175/70   Pulse 75   Temp 97.8 F (36.6 C)   Resp 11   Ht 1.702 m ('5\' 7"'$ )   Wt 80.3 kg   SpO2 100%   BMI 27.72 kg/m  Physical Exam Vitals and nursing note reviewed.  Constitutional:       General: She is not in acute distress.    Appearance: She is well-developed.  HENT:     Head: Normocephalic and atraumatic.     Right Ear: External ear normal.     Left Ear: External ear normal.  Eyes:     General: No scleral icterus.       Right eye: No discharge.        Left eye: No discharge.     Conjunctiva/sclera: Conjunctivae normal.  Neck:     Trachea: No tracheal deviation.  Cardiovascular:     Rate and Rhythm: Normal rate and regular rhythm.  Pulmonary:     Effort: Pulmonary effort is normal. No respiratory distress.     Breath sounds: Normal breath sounds. No stridor. No wheezing or rales.  Abdominal:     General: Bowel sounds are normal. There is no distension.     Palpations: Abdomen is soft.     Tenderness: There is no abdominal tenderness. There is no guarding or rebound.  Musculoskeletal:        General: No tenderness or deformity.     Cervical back: Neck supple.     Right lower leg: Edema present.     Left lower leg: Edema present.  Skin:    General: Skin is warm and dry.     Findings: No rash.  Neurological:     General: No focal deficit present.     Mental Status: She is alert.     Cranial Nerves: No cranial nerve deficit (no facial droop, extraocular movements intact, no slurred speech).     Sensory: No sensory deficit.     Motor: No abnormal muscle tone or seizure activity.     Coordination: Coordination normal.  Psychiatric:        Mood and Affect: Mood normal.     ED Results / Procedures / Treatments   Labs (all labs ordered are listed, but only abnormal results are displayed) Labs Reviewed  CBC - Abnormal; Notable for the following components:      Result Value   RBC 3.28 (*)    Hemoglobin 9.6 (*)    HCT 30.4 (*)    All other components within normal limits  COMPREHENSIVE METABOLIC PANEL - Abnormal; Notable for the following components:   BUN 46 (*)    Creatinine, Ser 3.39 (*)    GFR, Estimated 14 (*)    All other components within  normal limits  TROPONIN I (HIGH SENSITIVITY)  TROPONIN I (HIGH SENSITIVITY)    EKG EKG Interpretation  Date/Time:  Wednesday April 15 2022 17:03:38 EDT Ventricular Rate:  82 PR Interval:  180 QRS Duration: 136 QT Interval:  416 QTC Calculation:  486 R Axis:   9 Text Interpretation: Normal sinus rhythm Right bundle branch block Abnormal ECG When compared with ECG of 07-Mar-2022 12:19, No significant change since last tracing Confirmed by Dorie Rank [phone]) on 04/15/2022 5:49:48 PM  Radiology DG Chest 2 View  Result Date: 04/15/2022 CLINICAL DATA:  Chest pain EXAM: CHEST - 2 VIEW COMPARISON:  03/07/2022 FINDINGS: The heart size and mediastinal contours are within normal limits. Atherosclerotic aorta. Both lungs are clear. The visualized skeletal structures are unremarkable. IMPRESSION: No active cardiopulmonary disease. Electronically Signed   By: Franchot Gallo M.D.   On: 04/15/2022 17:49    Procedures Procedures    Medications Ordered in ED Medications  amLODipine (NORVASC) tablet 5 mg (5 mg Oral Given 04/15/22 2319)  alum & mag hydroxide-simeth (MAALOX/MYLANTA) 200-200-20 MG/5ML suspension 30 mL (30 mLs Oral Given 04/15/22 2320)    ED Course/ Medical Decision Making/ A&P Clinical Course as of 04/15/22 2326  Wed Apr 15, 2022  2253 Comprehensive metabolic panel(!) BUN/creatinine elevated but similar range compared to previous values [JK]  2254 It was 3.49, 25-monthago. [JK]  2254 CBC(!) Anemia noted but similar compared to previous values [JK]  2254 Troponin I (High Sensitivity) Troponin normal [JK]  2254 DG Chest 2 View Chest x-ray images and radiology report reviewed.  No evidence of pneumonia or pulmonary edema [JK]    Clinical Course User Index [JK] KDorie Rank MD           HEART Score: 4                Medical Decision Making Problems Addressed: Chest pain, unspecified type: acute illness or injury Gastroesophageal reflux disease, unspecified whether esophagitis  present: acute illness or injury Hypertension, unspecified type: chronic illness or injury  Amount and/or Complexity of Data Reviewed Labs: ordered. Decision-making details documented in ED Course. Radiology: ordered and independent interpretation performed.  Risk OTC drugs.   Patient presented to ED with complaints of chest discomfort.  Patient does have history of similar episodes in the past.  Records indicates she has been seen in the ED for this on June 30, July 5, July 8, July 15.  Patient does have history of some anxiety and memory issues.  She has had issues with recurrent hypertension.  Patient appears comfortable in the ED.  No respiratory difficulty.  Laboratory tests appear to be at baseline.  Initial troponin is normal.  Blood pressure is elevated.  Patient given a dose of oral medications. Patient does have moderate risk heart score but has had several episodes like this in the past.  I doubt ACS.  No signs of acute CHF exacerbation.  Suspect symptoms likely related to her acid reflux.  Will plan on checking second troponin.  Dr RRalene Bathewill follow up.  If unchanged anticipate discharge.         Final Clinical Impression(s) / ED Diagnoses Final diagnoses:  Chest pain, unspecified type  Hypertension, unspecified type  Gastroesophageal reflux disease, unspecified whether esophagitis present    Rx / DC Orders ED Discharge Orders     None         KDorie Rank MD 04/15/22 2326

## 2022-04-15 NOTE — ED Provider Triage Note (Signed)
Emergency Medicine Provider Triage Evaluation Note  Teresa Poole , a 71 y.o. female  was evaluated in triage.  Pt complains of a burning pain through her chest.  States she has history of acid reflux and takes PPI, and Mylanta.  Last took Mylanta 2 days ago.  States her acid reflux has never been this bad in the past.  Endorses history of CHF.  Review of Systems  Positive: As above Negative: As above  Physical Exam  BP 139/67 (BP Location: Left Arm)   Pulse 85   Temp 99 F (37.2 C) (Oral)   Resp 18   Ht '5\' 7"'$  (1.702 m)   Wt 80.3 kg   SpO2 98%   BMI 27.72 kg/m  Gen:   Awake, no distress   Resp:  Normal effort  MSK:   Moves extremities without difficulty  Other:   Medical Decision Making  Medically screening exam initiated at 5:28 PM.  Appropriate orders placed.  KEMORA PINARD was informed that the remainder of the evaluation will be completed by another provider, this initial triage assessment does not replace that evaluation, and the importance of remaining in the ED until their evaluation is complete.     Evlyn Courier, PA-C 04/15/22 1729

## 2022-04-15 NOTE — ED Triage Notes (Signed)
Pt reports being woken up by a sharp shooting pain in her neck. Pt also endorse mild chest pain and burning along her entire GI tract.

## 2022-04-16 NOTE — Discharge Instructions (Signed)
Please be sure to follow-up

## 2022-04-17 ENCOUNTER — Other Ambulatory Visit: Payer: Self-pay

## 2022-04-17 ENCOUNTER — Emergency Department (HOSPITAL_COMMUNITY): Payer: Medicare PPO

## 2022-04-17 ENCOUNTER — Observation Stay (HOSPITAL_COMMUNITY)
Admission: EM | Admit: 2022-04-17 | Discharge: 2022-04-18 | Disposition: A | Discharge status: Home / Self Care | Payer: Medicare PPO | Attending: Internal Medicine | Admitting: Internal Medicine

## 2022-04-17 DIAGNOSIS — D631 Anemia in chronic kidney disease: Secondary | ICD-10-CM | POA: Diagnosis not present

## 2022-04-17 DIAGNOSIS — E1122 Type 2 diabetes mellitus with diabetic chronic kidney disease: Secondary | ICD-10-CM | POA: Diagnosis not present

## 2022-04-17 DIAGNOSIS — I2510 Atherosclerotic heart disease of native coronary artery without angina pectoris: Secondary | ICD-10-CM | POA: Insufficient documentation

## 2022-04-17 DIAGNOSIS — N184 Chronic kidney disease, stage 4 (severe): Secondary | ICD-10-CM | POA: Diagnosis not present

## 2022-04-17 DIAGNOSIS — N390 Urinary tract infection, site not specified: Secondary | ICD-10-CM

## 2022-04-17 DIAGNOSIS — I130 Hypertensive heart and chronic kidney disease with heart failure and stage 1 through stage 4 chronic kidney disease, or unspecified chronic kidney disease: Secondary | ICD-10-CM | POA: Diagnosis not present

## 2022-04-17 DIAGNOSIS — Z79899 Other long term (current) drug therapy: Secondary | ICD-10-CM | POA: Diagnosis not present

## 2022-04-17 DIAGNOSIS — Z8616 Personal history of COVID-19: Secondary | ICD-10-CM | POA: Insufficient documentation

## 2022-04-17 DIAGNOSIS — R42 Dizziness and giddiness: Principal | ICD-10-CM | POA: Insufficient documentation

## 2022-04-17 DIAGNOSIS — R809 Proteinuria, unspecified: Secondary | ICD-10-CM | POA: Insufficient documentation

## 2022-04-17 DIAGNOSIS — I5042 Chronic combined systolic (congestive) and diastolic (congestive) heart failure: Secondary | ICD-10-CM | POA: Insufficient documentation

## 2022-04-17 DIAGNOSIS — N179 Acute kidney failure, unspecified: Secondary | ICD-10-CM | POA: Diagnosis not present

## 2022-04-17 DIAGNOSIS — Z8543 Personal history of malignant neoplasm of ovary: Secondary | ICD-10-CM | POA: Diagnosis not present

## 2022-04-17 DIAGNOSIS — Z95828 Presence of other vascular implants and grafts: Secondary | ICD-10-CM | POA: Diagnosis not present

## 2022-04-17 DIAGNOSIS — N189 Chronic kidney disease, unspecified: Secondary | ICD-10-CM

## 2022-04-17 LAB — COMPREHENSIVE METABOLIC PANEL
ALT: 15 U/L (ref 0–44)
AST: 18 U/L (ref 15–41)
Albumin: 3.4 g/dL — ABNORMAL LOW (ref 3.5–5.0)
Alkaline Phosphatase: 43 U/L (ref 38–126)
Anion gap: 10 (ref 5–15)
BUN: 51 mg/dL — ABNORMAL HIGH (ref 8–23)
CO2: 20 mmol/L — ABNORMAL LOW (ref 22–32)
Calcium: 8.7 mg/dL — ABNORMAL LOW (ref 8.9–10.3)
Chloride: 108 mmol/L (ref 98–111)
Creatinine, Ser: 4.5 mg/dL — ABNORMAL HIGH (ref 0.44–1.00)
GFR, Estimated: 10 mL/min — ABNORMAL LOW (ref >60)
Glucose, Bld: 140 mg/dL — ABNORMAL HIGH (ref 70–99)
Potassium: 5.3 mmol/L — ABNORMAL HIGH (ref 3.5–5.1)
Sodium: 138 mmol/L (ref 135–145)
Total Bilirubin: 0.2 mg/dL — ABNORMAL LOW (ref 0.3–1.2)
Total Protein: 6.2 g/dL — ABNORMAL LOW (ref 6.5–8.1)

## 2022-04-17 LAB — CBC WITH DIFFERENTIAL/PLATELET
Abs Immature Granulocytes: 0.02 10*3/uL (ref 0.00–0.07)
Basophils Absolute: 0.1 10*3/uL (ref 0.0–0.1)
Basophils Relative: 1 %
Eosinophils Absolute: 0.3 10*3/uL (ref 0.0–0.5)
Eosinophils Relative: 4 %
HCT: 27.3 % — ABNORMAL LOW (ref 36.0–46.0)
Hemoglobin: 8.8 g/dL — ABNORMAL LOW (ref 12.0–15.0)
Immature Granulocytes: 0 %
Lymphocytes Relative: 37 %
Lymphs Abs: 2.6 10*3/uL (ref 0.7–4.0)
MCH: 29.5 pg (ref 26.0–34.0)
MCHC: 32.2 g/dL (ref 30.0–36.0)
MCV: 91.6 fL (ref 80.0–100.0)
Monocytes Absolute: 0.5 10*3/uL (ref 0.1–1.0)
Monocytes Relative: 7 %
Neutro Abs: 3.6 10*3/uL (ref 1.7–7.7)
Neutrophils Relative %: 51 %
Platelets: 240 10*3/uL (ref 150–400)
RBC: 2.98 MIL/uL — ABNORMAL LOW (ref 3.87–5.11)
RDW: 14.4 % (ref 11.5–15.5)
WBC: 6.9 10*3/uL (ref 4.0–10.5)
nRBC: 0 % (ref 0.0–0.2)

## 2022-04-17 LAB — TROPONIN I (HIGH SENSITIVITY): Troponin I (High Sensitivity): 13 ng/L (ref <18)

## 2022-04-17 LAB — SODIUM, URINE, RANDOM: Sodium, Ur: 30 mmol/L

## 2022-04-17 LAB — URINALYSIS, ROUTINE W REFLEX MICROSCOPIC
Bacteria, UA: NONE SEEN
Bilirubin Urine: NEGATIVE
Glucose, UA: NEGATIVE mg/dL
Hgb urine dipstick: NEGATIVE
Ketones, ur: NEGATIVE mg/dL
Nitrite: NEGATIVE
Protein, ur: 30 mg/dL — AB
Specific Gravity, Urine: 1.011 (ref 1.005–1.030)
pH: 5 (ref 5.0–8.0)

## 2022-04-17 LAB — PROTEIN / CREATININE RATIO, URINE
Creatinine, Urine: 118 mg/dL
Protein Creatinine Ratio: 0.25 mg/mg{Cre} — ABNORMAL HIGH (ref 0.00–0.15)
Total Protein, Urine: 30 mg/dL

## 2022-04-17 LAB — CREATININE, URINE, RANDOM: Creatinine, Urine: 120 mg/dL

## 2022-04-17 MED ORDER — SODIUM CHLORIDE 0.9 % IV BOLUS: 500.0000 mL | Freq: Once | INTRAVENOUS | Status: DC

## 2022-04-17 MED ORDER — HEPARIN SODIUM (PORCINE) 5000 UNIT/ML IJ SOLN
5000.0000 [IU] | Freq: Three times a day (TID) | INTRAMUSCULAR | Status: DC
  Administered 2022-04-18: 5000 [IU] via SUBCUTANEOUS
  Filled 2022-04-17: qty 1

## 2022-04-17 MED ORDER — INSULIN ASPART 100 UNIT/ML IJ SOLN: 0.0000 [IU] | Freq: Every day | INTRAMUSCULAR | Status: DC

## 2022-04-17 MED ORDER — ACETAMINOPHEN 650 MG RE SUPP: 650.0000 mg | Freq: Four times a day (QID) | RECTAL | Status: DC | PRN

## 2022-04-17 MED ORDER — SODIUM CHLORIDE 0.9 % IV SOLN
1.0000 g | Freq: Once | INTRAVENOUS | Status: AC
  Administered 2022-04-17: 1 g via INTRAVENOUS
  Filled 2022-04-17: qty 10

## 2022-04-17 MED ORDER — SODIUM CHLORIDE 0.9 % IV BOLUS
1000.0000 mL | Freq: Once | INTRAVENOUS | Status: AC
  Administered 2022-04-17: 1000 mL via INTRAVENOUS

## 2022-04-17 MED ORDER — ACETAMINOPHEN 325 MG PO TABS: 650.0000 mg | ORAL_TABLET | Freq: Four times a day (QID) | ORAL | Status: DC | PRN

## 2022-04-17 MED ORDER — INSULIN ASPART 100 UNIT/ML IJ SOLN: 0.0000 [IU] | Freq: Three times a day (TID) | INTRAMUSCULAR | Status: DC

## 2022-04-17 NOTE — ED Notes (Signed)
This RN was notified by IV team that patient refused IV access. This RN tried to educate patient that we need IV access. Patient became aggressive, stated "Fuck you bitch, get out of my room. I don't need anything." This RN told IV team that the order will be cancelled and put reordered, after speaking with MD.

## 2022-04-17 NOTE — ED Notes (Signed)
Patient transported to X-ray 

## 2022-04-17 NOTE — ED Provider Notes (Signed)
[facility] EMERGENCY DEPARTMENT Provider Note   CSN: [id number] Arrival date & time: 04/17/22  1821     History  Chief Complaint  Patient presents with   Weakness    Teresa Poole is a 71 y.o. female.  HPI 71 year old female presents with bilateral leg weakness.  Has been an on and off problem every day for the last 3-4 weeks.  She will feel like she cannot support her body and then has to go lay down.  After taking a nap she usually feels a lot better.  She has not actually passed out.  She is worried she might fall.  She has had some leg swelling but states actually today the leg swelling seems better.  She denies any chest pain today though she did go to [facility] for chest pain a day or so ago.  No shortness of breath, headache, focal weakness, vomiting/diarrhea or medication changes.  She states she has "slight" dysuria over the last 3 to 4 days.  Home Medications Prior to Admission medications   Medication Sig Start Date End Date Taking? Authorizing Provider  albuterol (VENTOLIN HFA) 108 (90 Base) MCG/ACT inhaler Inhale 2 puffs into the lungs every 4 (four) hours as needed for wheezing or shortness of breath. Patient not taking: Reported on 02/28/2022 10/23/21   [provider]  carvedilol (COREG) 25 MG tablet Take 25 mg by mouth 2 (two) times daily with a meal.    [provider]  cetirizine (ZYRTEC) 10 MG tablet Take 10 mg by mouth daily as needed for allergies.    [provider]  cycloSPORINE, PF, (CEQUA) 0.09 % SOLN Place 1 drop into both eyes 2 (two) times daily. 12/18/20   [provider]  famotidine (PEPCID) 40 MG tablet Take 40 mg by mouth daily. 09/04/21   [provider]  fluticasone (FLONASE) 50 MCG/ACT nasal spray Place 2 sprays into both nostrils daily.    [provider]  furosemide (LASIX) 20 MG tablet Take 1 tablet (20 mg total) by mouth daily. Hold this medication, further instruction defer  to cardiology/nephrology 03/01/22   Florencia Reasons, MD  glucose blood (ONE TOUCH ULTRA TEST) test strip Use to check blood sugar 3 times daily Dx code E11.65 Patient taking differently: 1 each by Other route See admin instructions. Use to check blood sugar 3 times daily Dx code E11.65 11/20/14   Elayne Snare, MD  lisinopril (ZESTRIL) 40 MG tablet Take 40 mg by mouth daily. 03/05/22   [provider]  meclizine (ANTIVERT) 25 MG tablet Take 25 mg by mouth 3 (three) times daily as needed for dizziness.    [provider]  NIFEdipine (ADALAT CC) 60 MG 24 hr tablet Take 1 tablet (60 mg total) by mouth daily. 07/09/21   Daleen Bo, MD  omeprazole (PRILOSEC) 20 MG capsule Take 20 mg by mouth daily.    [provider]  omeprazole (PRILOSEC) 40 MG capsule Take 40 mg by mouth daily. 03/10/22   [provider]  ONETOUCH VERIO test strip [place] Patient taking differently: 1 each by Other route in the morning, at noon, and at bedtime. 03/30/18   Elayne Snare, MD  OZEMPIC, 0.25 OR 0.5 MG/DOSE, 2 MG/1.5ML SOPN Inject 0.25 mg into the skin once a week. Sundays 05/12/21   [provider]  polyethylene glycol (MIRALAX / GLYCOLAX) 17 g packet Take 17 g by mouth daily. Patient not taking: Reported on 10/25/2021 09/28/21  Eugenie Filler, MD  rosuvastatin (CRESTOR) 20 MG tablet Take 20 mg by mouth daily.    [provider]      Allergies    Hydralazine hcl and Atorvastatin    Review of Systems   Review of Systems  Constitutional:  Negative for fever.  Respiratory:  Negative for shortness of breath.   Cardiovascular:  Negative for chest pain.  Gastrointestinal:  Negative for abdominal pain, diarrhea and vomiting.  Genitourinary:  Positive for dysuria.  Neurological:  Positive for weakness. Negative for light-headedness and headaches.    Physical Exam Updated Vital Signs BP 107/65 (BP Location: Right Arm)   Pulse 60   Temp 98.3 F (36.8  C) (Oral)   Resp (!) 25   Ht '5\' 7"'$  (1.702 m)   Wt 80.3 kg   SpO2 100%   BMI 27.72 kg/m  Physical Exam Vitals and nursing note reviewed.  Constitutional:      Appearance: She is well-developed.  HENT:     Head: Normocephalic and atraumatic.  Eyes:     Extraocular Movements: Extraocular movements intact.     Pupils: Pupils are equal, round, and reactive to light.  Cardiovascular:     Rate and Rhythm: Normal rate and regular rhythm.     Heart sounds: Normal heart sounds.  Pulmonary:     Effort: Pulmonary effort is normal.     Breath sounds: Normal breath sounds.  Abdominal:     General: There is no distension.     Palpations: Abdomen is soft.     Tenderness: There is no abdominal tenderness.  Musculoskeletal:     Comments: There is some mild left pedal edema  Skin:    General: Skin is warm and dry.  Neurological:     Mental Status: She is alert and oriented to person, place, and time.     Comments: CN 3-12 grossly intact. 5/5 strength in all 4 extremities. Grossly normal sensation. Normal finger to nose.      ED Results / Procedures / Treatments   Labs (all labs ordered are listed, but only abnormal results are displayed) Labs Reviewed  COMPREHENSIVE METABOLIC PANEL - Abnormal; Notable for the following components:      Result Value   Potassium 5.3 (*)    CO2 20 (*)    Glucose, Bld 140 (*)    BUN 51 (*)    Creatinine, Ser 4.50 (*)    Calcium 8.7 (*)    Total Protein 6.2 (*)    Albumin 3.4 (*)    Total Bilirubin 0.2 (*)    GFR, Estimated 10 (*)    All other components within normal limits  CBC WITH DIFFERENTIAL/PLATELET - Abnormal; Notable for the following components:   RBC 2.98 (*)    Hemoglobin 8.8 (*)    HCT 27.3 (*)    All other components within normal limits  URINALYSIS, ROUTINE W REFLEX MICROSCOPIC - Abnormal; Notable for the following components:   Protein, ur 30 (*)    Leukocytes,Ua MODERATE (*)    All other components within normal limits   TROPONIN I (HIGH SENSITIVITY)    EKG EKG Interpretation  Date/Time:  Friday April 17 2022 19:52:35 EDT Ventricular Rate:  59 PR Interval:  195 QRS Duration: 159 QT Interval:  460 QTC Calculation: 456 R Axis:   -2 Text Interpretation: Sinus rhythm Right bundle branch block similar to 2 days ago Confirmed by Sherwood Gambler [phone]) on 04/17/2022 9:23:48 PM  Radiology DG Chest 2 View  Result Date: 04/17/2022 CLINICAL DATA:  Lower extremity swelling EXAM: CHEST - 2 VIEW COMPARISON:  None Available. FINDINGS: Lungs are well expanded, symmetric, and clear. No pneumothorax or pleural effusion. Cardiac size within normal limits. Pulmonary vascularity is normal. Osseous structures are age-appropriate. No acute bone abnormality. IMPRESSION: No active cardiopulmonary disease. Electronically Signed   By: Fidela Salisbury M.D.   On: 04/17/2022 19:36    Procedures Procedures    Medications Ordered in ED Medications  sodium chloride 0.9 % bolus 1,000 mL (1,000 mLs Intravenous New Bag/Given 04/17/22 2147)  cefTRIAXone (ROCEPHIN) 1 g in sodium chloride 0.9 % 100 mL IVPB (0 g Intravenous Stopped 04/17/22 2256)    ED Course/ Medical Decision Making/ A&P                           Medical Decision Making Amount and/or Complexity of Data Reviewed Labs: ordered. Radiology: ordered.  Risk Decision regarding hospitalization.   Patient's work-up shows an acute on chronic kidney injury.  This likely explains her generalized weakness.  Unclear why it has popped up today as she was just in the ER a day or 2 ago with stable chronic kidney disease.  Otherwise she has chronic anemia.  Urinalysis is concerning for UTI, especially in the setting of her dysuria.  No pneumonia on chest x-ray.  EKG without acute ischemia.  She will need admission and some IV fluids.  She does have a history of CHF but I think she can tolerate fluids in the setting of an AKI.  Discussed with internal medicine for  admission.        Final Clinical Impression(s) / ED Diagnoses Final diagnoses:  Acute kidney injury superimposed on chronic kidney disease ([place])  Acute urinary tract infection    Rx / DC Orders ED Discharge Orders     None         Sherwood Gambler, MD 04/17/22 2319

## 2022-04-17 NOTE — Hospital Course (Signed)
Wasn't feeling weel. Couldn't walk down the hall without feeling dizzy. Started feeling dizzy as walking. Never happened before. Checked blood pressure when this happened lower side. Legs felt very weak. Sometimes during the night feels very weak. Sometimes gets up to urinate and legs feel very weak during this time. Feet are swollen but better compared to the day prior.   Went to urgent care (told to elevate legs) swelling went down. Walking to get water lying down did not help. And legs were still swollen.   Took blood pressure before medications 120/90 which is low compared to baseline which is usually this after medications. Called ambulaance because stayed dizzy. When got to the ed walked with no issues. This was before any fluids.   Did not fall. Fearful of fallen prior because of weakness in legs (legs fatigued). Been going on for months. Patient states these are like episodes. Sojmetimes normal. Early in the AM before breakfast and eat get this weakness and early in the AM like 0100 similar presentation.   Sometimes up to 200s prior to medications first thing in the morning. This has been going on for awhile. Unclear exact length.  Diagnosed in 66s.   Chills but ongoing formonths, not take fever ( felt cold)  Chest pressure this morning, walking at the time when started to feel it. Sat down.Got better when sat down. Chest pressure came back when attempted to go to car to drive to hospital. No radiation of chest pressure. Arm or jaw.   No heart attack. No cough.   Sinus pressure.   Double vision when having the episode. Chronic flank pain.   Ehappens every 3 months or 6 months. But does not really know.   Has not seen [place] doctor yet. 9/15.  Tries to stay hydrated and urinates frequently however. Mild burning sometimes.   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   FH:   PGM F PU all died because of hypertension

## 2022-04-17 NOTE — ED Notes (Signed)
This RN called lab to add on urine labs.

## 2022-04-17 NOTE — ED Notes (Addendum)
This RN notified patient she will be receiving an IV and IV fluids d/t her soft blood pressures. Pt grew agitated and stated, "I don't need IV fluids, I'm not dehydrated." This RN educated patient that the fluids were for stabilizing her BP and not her hydration. Patient stated, "Well that's just stupid, and is why I'm going to file a lawsuit. None of y'all are doing anything for me." This RN was unable to start an IV after 2 attempts. MD notified of this RN's inability to get IV access and start fluids. RN placed an order for IV team.

## 2022-04-17 NOTE — ED Triage Notes (Signed)
Pt bib [facility] for weakness on standing and swelling of bilateral lower extremities x2 days. Pt states she was seen at [facility] yesterday for the same.   BP 112/56, HR 60, RR 18, Spo2 97% Ra, Cbg 112

## 2022-04-18 ENCOUNTER — Observation Stay (HOSPITAL_COMMUNITY): Payer: Medicare PPO

## 2022-04-18 ENCOUNTER — Observation Stay (HOSPITAL_BASED_OUTPATIENT_CLINIC_OR_DEPARTMENT_OTHER): Payer: Medicare PPO

## 2022-04-18 DIAGNOSIS — R079 Chest pain, unspecified: Secondary | ICD-10-CM | POA: Diagnosis not present

## 2022-04-18 LAB — COMPREHENSIVE METABOLIC PANEL
ALT: 14 U/L (ref 0–44)
AST: 20 U/L (ref 15–41)
Albumin: 3.1 g/dL — ABNORMAL LOW (ref 3.5–5.0)
Alkaline Phosphatase: 37 U/L — ABNORMAL LOW (ref 38–126)
Anion gap: 8 (ref 5–15)
BUN: 50 mg/dL — ABNORMAL HIGH (ref 8–23)
CO2: 23 mmol/L (ref 22–32)
Calcium: 8.4 mg/dL — ABNORMAL LOW (ref 8.9–10.3)
Chloride: 110 mmol/L (ref 98–111)
Creatinine, Ser: 4.37 mg/dL — ABNORMAL HIGH (ref 0.44–1.00)
GFR, Estimated: 10 mL/min — ABNORMAL LOW (ref >60)
Glucose, Bld: 170 mg/dL — ABNORMAL HIGH (ref 70–99)
Potassium: 5.2 mmol/L — ABNORMAL HIGH (ref 3.5–5.1)
Sodium: 141 mmol/L (ref 135–145)
Total Bilirubin: 0.4 mg/dL (ref 0.3–1.2)
Total Protein: 5.7 g/dL — ABNORMAL LOW (ref 6.5–8.1)

## 2022-04-18 LAB — ECHOCARDIOGRAM LIMITED
Area-P 1/2: 3.99 cm2
Height: 67 in
P 1/2 time: 289 msec
S' Lateral: 3.2 cm
Weight: 2832 oz

## 2022-04-18 LAB — VITAMIN B12: Vitamin B-12: 242 pg/mL (ref 180–914)

## 2022-04-18 LAB — IRON AND TIBC
Iron: 47 ug/dL (ref 28–170)
Saturation Ratios: 16 % (ref 10.4–31.8)
TIBC: 295 ug/dL (ref 250–450)
UIBC: 248 ug/dL

## 2022-04-18 LAB — LIPID PANEL
Cholesterol: 137 mg/dL (ref 0–200)
HDL: 46 mg/dL (ref >40)
LDL Cholesterol: 70 mg/dL (ref 0–99)
Total CHOL/HDL Ratio: 3 RATIO
Triglycerides: 103 mg/dL (ref <150)
VLDL: 21 mg/dL (ref 0–40)

## 2022-04-18 LAB — CBG MONITORING, ED
Glucose-Capillary: 111 mg/dL — ABNORMAL HIGH (ref 70–99)
Glucose-Capillary: 138 mg/dL — ABNORMAL HIGH (ref 70–99)
Glucose-Capillary: 144 mg/dL — ABNORMAL HIGH (ref 70–99)

## 2022-04-18 LAB — HEMOGLOBIN A1C
Hgb A1c MFr Bld: 6.1 % — ABNORMAL HIGH (ref 4.8–5.6)
Mean Plasma Glucose: 128.37 mg/dL

## 2022-04-18 LAB — FERRITIN: Ferritin: 24 ng/mL (ref 11–307)

## 2022-04-18 LAB — TSH: TSH: 1.815 u[IU]/mL (ref 0.350–4.500)

## 2022-04-18 LAB — CBC
HCT: 25.2 % — ABNORMAL LOW (ref 36.0–46.0)
Hemoglobin: 8.1 g/dL — ABNORMAL LOW (ref 12.0–15.0)
MCH: 29.3 pg (ref 26.0–34.0)
MCHC: 32.1 g/dL (ref 30.0–36.0)
MCV: 91.3 fL (ref 80.0–100.0)
Platelets: 216 10*3/uL (ref 150–400)
RBC: 2.76 MIL/uL — ABNORMAL LOW (ref 3.87–5.11)
RDW: 14.4 % (ref 11.5–15.5)
WBC: 5.8 10*3/uL (ref 4.0–10.5)
nRBC: 0 % (ref 0.0–0.2)

## 2022-04-18 LAB — FOLATE: Folate: 17 ng/mL (ref >5.9)

## 2022-04-18 MED ORDER — PANTOPRAZOLE SODIUM 40 MG PO TBEC
40.0000 mg | DELAYED_RELEASE_TABLET | Freq: Every day | ORAL | Status: DC
  Administered 2022-04-18: 40 mg via ORAL
  Filled 2022-04-18: qty 1

## 2022-04-18 MED ORDER — FLUTICASONE PROPIONATE 50 MCG/ACT NA SUSP
2.0000 | Freq: Every day | NASAL | Status: DC
  Filled 2022-04-18: qty 16

## 2022-04-18 MED ORDER — LORATADINE 10 MG PO TABS
10.0000 mg | ORAL_TABLET | Freq: Every day | ORAL | Status: DC
  Administered 2022-04-18: 10 mg via ORAL
  Filled 2022-04-18: qty 1

## 2022-04-18 MED ORDER — VITAMIN B-12 1000 MCG PO TABS
1000.0000 ug | ORAL_TABLET | Freq: Every day | ORAL | Status: DC
Start: 2022-04-18 — End: 2022-04-18
  Administered 2022-04-18: 1000 ug via ORAL
  Filled 2022-04-18: qty 1

## 2022-04-18 MED ORDER — ASPIRIN 81 MG PO CHEW
81.0000 mg | CHEWABLE_TABLET | Freq: Every day | ORAL | Status: DC
  Administered 2022-04-18: 81 mg via ORAL
  Filled 2022-04-18: qty 1

## 2022-04-18 MED ORDER — ROSUVASTATIN CALCIUM 5 MG PO TABS
10.0000 mg | ORAL_TABLET | Freq: Every day | ORAL | Status: DC
  Administered 2022-04-18: 10 mg via ORAL
  Filled 2022-04-18: qty 2

## 2022-04-18 MED ORDER — SODIUM ZIRCONIUM CYCLOSILICATE 10 G PO PACK
10.0000 g | PACK | Freq: Once | ORAL | Status: AC
  Administered 2022-04-18: 10 g via ORAL
  Filled 2022-04-18: qty 1

## 2022-04-18 MED ORDER — SODIUM CHLORIDE 0.9 % IV SOLN
250.0000 mg | Freq: Once | INTRAVENOUS | Status: DC
Start: 2022-04-18 — End: 2022-04-18
  Filled 2022-04-18: qty 20

## 2022-04-18 NOTE — Evaluation (Signed)
Physical Therapy Evaluation Patient Details Name: Teresa Poole MRN: [id number] DOB: [birth year] Today's Date: 04/18/2022  History of Present Illness  71 y/o femal presented to ED on 04/17/22 for dizziness, weakness and swelling in B LE x 2 days. Admitted for dizziness with unclear etiology and AKI on CKD. PMH: CKD IV, CHF, HTN, DM II, anemia, memory issues.  Clinical Impression  Patient admitted with the above. PTA, patient lives alone and reports independence. Patient currently mobilizing at independent level with no AD. Patient reports intermittent bouts of weakness at home making it difficulty to ambulate. Educated patient if this occurs while in ED to notify RN and if occurs at home call family members to assist, patient verbalized understanding. No further skilled PT needs identified acutely. No PT follow up recommended at this time. PT will sign off.        Recommendations for follow up therapy are one component of a multi-disciplinary discharge planning process, led by the attending physician.  Recommendations may be updated based on patient status, additional functional criteria and insurance authorization.  Follow Up Recommendations No PT follow up      Assistance Recommended at Discharge None  Patient can return home with the following       Equipment Recommendations None recommended by PT  Recommendations for Other Services       Functional Status Assessment Patient has not had a recent decline in their functional status     Precautions / Restrictions Precautions Precautions: None Restrictions Weight Bearing Restrictions: No      Mobility  Bed Mobility Overal bed mobility: Independent                  Transfers Overall transfer level: Independent Equipment used: None                    Ambulation/Gait Ambulation/Gait assistance: Independent Gait Distance (Feet): 250 Feet Assistive device: None Gait Pattern/deviations: WFL(Within  Functional Limits)          Stairs            Wheelchair Mobility    Modified Rankin (Stroke Patients Only)       Balance Overall balance assessment: No apparent balance deficits (not formally assessed)                                           Pertinent Vitals/Pain Pain Assessment Pain Assessment: No/denies pain    Home Living Family/patient expects to be discharged to:: Private residence Living Arrangements: Alone Available Help at Discharge: Family;Available PRN/intermittently Type of Home: Apartment Home Access: Stairs to enter Entrance Stairs-Rails: Right Entrance Stairs-Number of Steps: 15   Home Layout: One level Home Equipment: Cane - single point      Prior Function Prior Level of Function : Independent/Modified Independent;Driving             Mobility Comments: drives, denies falls       Hand Dominance        Extremity/Trunk Assessment   Upper Extremity Assessment Upper Extremity Assessment: Defer to OT evaluation    Lower Extremity Assessment Lower Extremity Assessment: Overall WFL for tasks assessed    Cervical / Trunk Assessment Cervical / Trunk Assessment: Normal  Communication   Communication: No difficulties  Cognition Arousal/Alertness: Awake/alert Behavior During Therapy: WFL for tasks assessed/performed Overall Cognitive Status: History of cognitive impairments - at baseline  General Comments: hx of memory deficits but otherwise [facility]        General Comments      Exercises     Assessment/Plan    PT Assessment Patient does not need any further PT services  PT Problem List         PT Treatment Interventions      PT Goals (Current goals can be found in the Care Plan section)  Acute Rehab PT Goals Patient Stated Goal: to go home PT Goal Formulation: All assessment and education complete, DC therapy    Frequency       Co-evaluation                AM-PAC PT "6 Clicks" Mobility  Outcome Measure Help needed turning from your back to your side while in a flat bed without using bedrails?: None Help needed moving from lying on your back to sitting on the side of a flat bed without using bedrails?: None Help needed moving to and from a bed to a chair (including a wheelchair)?: None Help needed standing up from a chair using your arms (e.g., wheelchair or bedside chair)?: None Help needed to walk in hospital room?: None Help needed climbing 3-5 steps with a railing? : None 6 Click Score: 24    End of Session   Activity Tolerance: Patient tolerated treatment well Patient left: in bed;with call bell/phone within reach (ultrasound tech at bedside) Nurse Communication: Mobility status PT Visit Diagnosis: Muscle weakness (generalized) (M62.81)    Time: 1026-1040 PT Time Calculation (min) (ACUTE ONLY): 14 min   Charges:   PT Evaluation $PT Eval Low Complexity: 1 Low          Maurie Olesen A. Gilford Rile PT, DPT Acute Rehabilitation Services Office [phone]   Linna Hoff 04/18/2022, 10:55 AM

## 2022-04-18 NOTE — H&P (Addendum)
Date: 04/18/2022               Patient Name:  Teresa Poole MRN: [id number]  DOB: 1951/03/10 Age / Sex: 71 y.o., female   PCP: [name], MD         Medical Service: Internal Medicine Teaching Service         Attending Physician: Dr. Jimmye Norman, Elaina Pattee, MD    First Contact: Dr. Iona Coach, MD Pager: [phone]   Second Contact: Dr. Sanjuan Dame, MD Pager: [phone]        After Hours (After 5p/  First Contact Pager: [phone]  weekends / holidays): Second Contact Pager: [phone]   Chief Complaint: dizziness  History of Present Illness:   Teresa Poole is a 71 y.o. female with medical history significant for CKD stage IV, HFimEF (EF improved to 55-60%, G1DD by TTE 06/11/2021), nonischemic cardiomyopathy, carotid stenosis (8-84% ZYSA/63-01% LICA 60/1093), nonobstructive CAD by [place] 2015, RBBB, left renal artery stenosis s/p stenting (09/2018), GERD, T2DM, HTN, HLD, anemia of CKD, anxiety, and memory issues who presents with dizziness and possible worsening of her CKD.   History is provided by patient.   She states that on the day of admission she was not feeling well. Specifically, she was walking to her refrigerator and as she was walking to the kitchen in her home's hallway, she started to feel dizzy. During this time, she noticed that her legs felt extremely weak like they might give out and felt she might fall, however, she had no palpitations, chest pain, dyspnea, diaphoresis, warmness, presyncope, or syncope.  She also had some mild edema in her feet but this is a chronic issue for her. She went to lie down to help ease her dizziness but her symptoms did not improve. She took her blood pressure at this time and states that it was on the lower side for her which was ~235T systolic.   She states that she has never had these episodes before but was prescribed meclizine at some point. She does note that she has not taken this medication before. She also states that  once she was in the ED she was able to ambulated to the bathroom without any symptoms, prior to receiving any IV fluids.   Patient had several other symptoms that she mentioned. She states that her leg weakness/fatigue has been ongoing for months. She states that the fatigue feeling occurs in episodes and that in between these episodes she feels her strength is at her normal. The episodes occur in the AM prior to breakfast and around 0100 in the AM, which is the time she usually rises to urinate. She again notes she has never fall but feels fearful of falling.   She also states that she had some chest pressure in the center of her chest. She was ambulating when she began to feel this discomfort. The pressure did not radiate to the jaw or arm. She had no associated N/V or palpitations and the pressure subsided with sitting down and resting. When she attempted to walk to her car and drive herself to the hospital she again experienced these symptoms. On review of the EMR, it appears she has been either admitted or evaluated in the ED only for similar symptoms 6/30, 7/5, 7/8, 7/15, 8/23. She was evaluated by cardiology 03/01/2022 (please see this note for synopsis of recent ED visits/admissions who felt her symptoms were not cardiac related and likely GI in origin or somatic  symptoms and felt she required no further cardiac testing as she had normal ECG and troponins as well as previous coronary evaluation with mild nonobstructive CAD.   She feels these chest pressure episodes happen infrequently, ~ every 3 months. She has some associated double vision that is self limiting.   She feels cold frequently but has had no fever, no cough, no abdominal pain, N/V, diarrhea, or constipation. She has chronic flank pain.   Of note, patient is followed by Dr. Valora Piccolo who referred her to neurology and psychiatry for "chronic progressive cognitive symptoms of uncertain etiology". A brain MRI was ordered by not obtained.    PMH:  Mild cognitive impairment HTN  Apple cider vinegar  Nifedipine '60mg'$  BID  Coreg '25mg'$  BID  Lisinopril '40mg'$  qd HFimEF Furosemide '20mg'$  qd  Mild non obs CAD Crestor '20mg'$  qd, asa '81mg'$  qd GERD Mylanta Omeprazole '40mg'$  qd   Famotidimne '40mg'$   T2DM Seasonal allergies Cetirizine '10mg'$  qd PRN  Osteoarthritis bilateral knees and shoulders  PSH: H/o ovarian cancer s/p total abdominal hysterectomy   SH:  Quit smoking in 20s, only smoked for ~40monthSocial drinking very rare No other drugs  Lives alone, no children. Able to perform all of her iadls/adls  Has a sister who lives close by   FH:  Paternal GM HTN Father HTN  Meds:  No current facility-administered medications on file prior to encounter.   Current Outpatient Medications on File Prior to Encounter  Medication Sig Dispense Refill   albuterol (VENTOLIN HFA) 108 (90 Base) MCG/ACT inhaler Inhale 2 puffs into the lungs every 4 (four) hours as needed for wheezing or shortness of breath. (Patient not taking: Reported on 02/28/2022)     carvedilol (COREG) 25 MG tablet Take 25 mg by mouth 2 (two) times daily with a meal.     cetirizine (ZYRTEC) 10 MG tablet Take 10 mg by mouth daily as needed for allergies.     cycloSPORINE, PF, (CEQUA) 0.09 % SOLN Place 1 drop into both eyes 2 (two) times daily.     famotidine (PEPCID) 40 MG tablet Take 40 mg by mouth daily.     fluticasone (FLONASE) 50 MCG/ACT nasal spray Place 2 sprays into both nostrils daily.     furosemide (LASIX) 20 MG tablet Take 1 tablet (20 mg total) by mouth daily. Hold this medication, further instruction defer to cardiology/nephrology 30 tablet    glucose blood (ONE TOUCH ULTRA TEST) test strip Use to check blood sugar 3 times daily Dx code E11.65 (Patient taking differently: 1 each by Other route See admin instructions. Use to check blood sugar 3 times daily Dx code E11.65) 100 each 3   lisinopril (ZESTRIL) 40 MG tablet Take 40 mg by mouth daily.     meclizine  (ANTIVERT) 25 MG tablet Take 25 mg by mouth 3 (three) times daily as needed for dizziness.     NIFEdipine (ADALAT CC) 60 MG 24 hr tablet Take 1 tablet (60 mg total) by mouth daily. 1 tablet 0   omeprazole (PRILOSEC) 40 MG capsule Take 40 mg by mouth daily.     ONETOUCH VERIO test strip CHECK BLOOD SUGAR THREE TIMES DAILY (Patient taking differently: 1 each by Other route in the morning, at noon, and at bedtime.) 200 each 0   OZEMPIC, 0.25 OR 0.5 MG/DOSE, 2 MG/1.5ML SOPN Inject 0.25 mg into the skin once a week. Sundays     polyethylene glycol (MIRALAX / GLYCOLAX) 17 g packet Take 17 g by mouth daily. (  Patient not taking: Reported on 10/25/2021) 14 each 0   rosuvastatin (CRESTOR) 20 MG tablet Take 20 mg by mouth daily.       Allergies: Allergies as of 04/17/2022 - Review Complete 04/17/2022  Allergen Reaction Noted   Hydralazine hcl Itching 10/04/2018   Atorvastatin Other (See Comments) 11/27/2018   Past Medical History:  Diagnosis Date   Allergy    Anemia    Carotid stenosis    Chronic combined systolic and diastolic CHF (congestive heart failure) ([place])    CKD (chronic kidney disease), stage IV ([place]) 09/24/2013   Pt at [facility], Dr. Joelyn Oms   COVID-19 10/24/2021   Diabetes mellitus type 2 in nonobese [facility])    Edema 09/14/2013   GERD (gastroesophageal reflux disease)    History of ovarian cancer    Hyperlipidemia    Hypertension    Mild CAD    non-obstructive by [facility] (09/25/2013): Proximal and mid LAD serial 20%, proximal circumflex 30%, mid AV groove circumflex 30%, mid RCA mild plaque.   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   Physical Exam: Blood pressure (!) 105/52, pulse 78, temperature 97.7 F (36.5 C), temperature source Oral, resp. rate 16, height '5\' 7"'$  (1.702 m), weight 80.3 kg, SpO2 95 %.  Constitutional: Well-developed, well-nourished, and in no distress.  HENT:  Head: Normocephalic and atraumatic.  Eyes: EOM are normal. Horizontal nystagmus to R and L Neck: Normal range of motion.  Oropharynx: Dry mucus membrane Cardiovascular: Normal rate, regular rhythm, intact distal pulses. No gallop and no friction rub.  No murmur heard. Pedal edema R>L, Hyperpigmentation of bilateral feet and ankles  Pulmonary: Non labored breathing on room air, no wheezing or rales  Abdominal: Soft. Normal bowel sounds. Non distended and non tender Musculoskeletal: Normal range of motion.        General: No tenderness Neurological: Alert and oriented to person, place, and time. Non focal, BUE/BLE 5/5 Skin: Skin is warm and dry.    EKG: personally reviewed my interpretation is NSR, RBBB, t wave inversion in III, and V2. Similar to prior 7//24  CXR: personally reviewed my interpretation is no acute cardiopulmonary disease   Assessment & Plan by Problem: Principal Problem:   Dizziness Hosanna Betley Sacco is a 71 y.o. female with medical history significant for CKD stage IV, HFimEF (EF improved to 55-60%, G1DD by TTE 06/11/2021), nonischemic cardiomyopathy, carotid stenosis (9-73% ZHGD/92-42% LICA 68/3419), nonobstructive CAD by [place] 2015, RBBB, left renal artery stenosis s/p stenting (09/2018), GERD, T2DM, HTN, HLD, anemia of CKD, anxiety, and memory issues who presents with dizziness but was noted to have several other complaints and on laboratory evaluation AKI on  CKD.  #Dizziness  Unclear exact etiology of patient's symptoms. She is on multiple antihypertensives that  could be causing her symptoms. Unable to perform dix hallpike maneuver, however patient's symptoms were not provoked by movement of her head making BPPV less likely. Her urinalysis does show some hyaline casts and she has a possible AKI on CKD suggesting hypovolemia as possible contributing factor, though patient notes she is drinks water frequently.  -Obtain orthostatic vitals -s/p IV fluids -PT evaluation  -Hold home antihypertensives   #Chest pressure Patient has a history of mild non obs CAD. Multiple ED visits for similar complaints, however this time she states that her symptoms begin with exertion and are relieve with rest. Her troponins are negative and her EKG is normal. This could represent stable angina. She denies any abdominal pain, less likely GI related. GI causes thought to be etiology of prior symptoms.  -Obtain TTE, last Echo 05/2021 mild hypokinesis in basal inferior wall, EF 55-60% -Consider nuclear stress test given renal function    #AKI on CKD #Anemic of CKD #Proteinuria    Latest Ref Rng & Units 04/18/2022    1:54 AM 04/17/2022    7:49 PM 04/15/2022    5:53 PM  BMP  Glucose 70 - 99 mg/dL 170  140  96   BUN 8 - 23 mg/dL 50  51  46   Creatinine 0.44 - 1.00 mg/dL 4.37  4.50  3.39   Sodium 135 - 145 mmol/L 141  138  143   Potassium 3.5 - 5.1 mmol/L 5.2  5.3  4.7   Chloride 98 - 111 mmol/L 110  108  111   CO2 22 - 32 mmol/L '23  20  23   '$ Calcium 8.9 - 10.3 mg/dL 8.4  8.7  9.1    Serum creatinine is mildly worsened. GFR only minimally changed. Patient reports adequate PO intake with no N/V or diarrhea. Her UA is notable for hyaline casts. She is s/p IVFs in the ED. -F/u BMP in the AM -Strict I/O's -F/u Ardelia Mems -F/u UPCr  #HFimEF #HTN Soft blood pressures here. Appears euvolemic to dry on exam given no significant edema and dry mucus membranes -Hold  antihypertensives and diuretic   #Mild Cognitive impairment This is per previous notes. May need further cognitive evaluation while hospitalized. Vit b12 is low normal. TSH is WNL. -Replete vitb12   #T2DM Well controlled on ozempic. A1c 6.1.  SSI while inpatient, start basal insulin depending on insulin requirement  Dispo: Admit patient to Observation with expected length of stay less than 2 midnights.  Signed: Rick Duff, MD 04/18/2022, 2:39 AM  After 5pm on weekdays and 1pm on weekends: On Call pager: [phone]

## 2022-04-18 NOTE — ED Notes (Signed)
Admitting and pharmD at [facility]

## 2022-04-18 NOTE — Discharge Summary (Addendum)
Name: Teresa Poole MRN: [id number] DOB: 20-Feb-1951 71 y.o. PCP: Teresa Coma., MD  Date of Admission: 04/17/2022  6:21 PM Date of Discharge: 04/18/2022  1:00 PM Attending Physician: No att. providers found  Discharge Diagnosis: 1. Principal Problem:   Dizziness Hypotension in the setting of polypharmacy-resolved Neurocognitive Disorder  Discharge Medications: Allergies as of 04/18/2022       Reactions   Hydralazine Hcl Itching   ENTIRE BODY = burning sensation, also in the eyes (they have become very sensitive to light)   Atorvastatin Other (See Comments)   Per MD - pt not sure of reaction         Medication List     STOP taking these medications    furosemide 20 MG tablet Commonly known as: LASIX   lisinopril 40 MG tablet Commonly known as: ZESTRIL   NIFEdipine 60 MG 24 hr tablet Commonly known as: ADALAT CC       TAKE these medications    albuterol 108 (90 Base) MCG/ACT inhaler Commonly known as: VENTOLIN HFA Inhale 2 puffs into the lungs every 4 (four) hours as needed for wheezing or shortness of breath.   carvedilol 25 MG tablet Commonly known as: COREG Take 25 mg by mouth 2 (two) times daily with a meal.   Cequa 0.09 % Soln Generic drug: cycloSPORINE (PF) Place 1 drop into both eyes 2 (two) times daily.   cetirizine 10 MG tablet Commonly known as: ZYRTEC Take 10 mg by mouth daily as needed for allergies.   famotidine 40 MG tablet Commonly known as: PEPCID Take 40 mg by mouth daily.   fluticasone 50 MCG/ACT nasal spray Commonly known as: FLONASE Place 2 sprays into both nostrils daily.   glucose blood test strip Commonly known as: ONE TOUCH ULTRA TEST Use to check blood sugar 3 times daily Dx code E11.65 What changed:  how much to take how to take this when to take this   OneTouch Verio test strip Generic drug: glucose blood CHECK BLOOD SUGAR THREE TIMES DAILY What changed: See the new instructions.   meclizine 25 MG  tablet Commonly known as: ANTIVERT Take 25 mg by mouth 3 (three) times daily as needed for dizziness.   omeprazole 40 MG capsule Commonly known as: PRILOSEC Take 40 mg by mouth daily.   Ozempic (0.25 or 0.5 MG/DOSE) 2 MG/1.5ML Sopn Generic drug: Semaglutide(0.25 or 0.'5MG'$ /DOS) Inject 0.25 mg into the skin once a week. Sundays   polyethylene glycol 17 g packet Commonly known as: MIRALAX / GLYCOLAX Take 17 g by mouth daily.   rosuvastatin 20 MG tablet Commonly known as: CRESTOR Take 20 mg by mouth daily.        Disposition and follow-up:   Ms.Teresa Poole was discharged from [facility] in Good condition.  At the hospital follow up visit please address:  1.   HTN -discontinued lisinopril, lasix, nifedipine given hypotension -repeat BMP -medicine reconciliation  AKI on CKD -follow up BMP  Neurocognitive disorder -Continue to work up - Consider need for intervention/ long term support given currently living alone, cooking, driving, managing medications which may be dangerous  2.  Labs / imaging needed at time of follow-up: BMP  3.  Pending labs/ test needing follow-up: NA  Follow-up Appointments:   Hospital Course by problem list: Teresa Poole is a 71 y.o. 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   Dizziness   She is on multiple antihypertensives that are likely causing her symptoms. Upon seeing her medications, she is taking nifedipine '60mg'$  BID, lisinopril 40 mg, carvedilol 25 mg BID. These doses are discrepant with recent discharge summaries in July 2023 which show  doses as low as lisinopril '10mg'$  once daily and nifedipine '60mg'$  daily, and instructions to stop lisinopril. Newer doses appear to have been changed with an appointment with Dr. Valora Piccolo 7/18, despite multiple ED visits for low blood pressure. The patient has some suspected underlying neurocognitive disorder which may be affecting her ability to take medications appropriately. She was found to have 8 nifedipine pills in her pill container for Saturday (today). She says she honestly cant remember why there were that many but it may be related to the confusion that brought her into the ED. She verbalized she only takes two daily and would have known not to take that many and to reorganize them at a later time. She was continued on carvedilol at discharge and all other antihypertensives were decreased. She was given a new pill organizer with PM and AM daily dosing slots. Her BP improved from 26/83 to systolics of 419-622 s/p 2.9N IVF and cessation of antihypertensives. She was discharged from the ED after evaluation by the internal medicine team.  Neurocognitive Disorder Visuospatial decline memory loss The patient lives at home alone and performs all IADLs/ADLs per her report. She says she has a sister that lives close by but otherwise doesn't have a spouse or children. She has recently reported getting lost while she is driving, finding herself outside of her apartment and not knowing why, checking things in her home like the oven consistently without knowing why. On exam she was perseverative, had an eccentric and heightened mood/affect with juvenile demeanor and kept repeating her story as if never told. She was screened with a minicog that was found to be 1/5 score with poor visuospatial performance, 1/3 words recalled at five minutes. This was uploaded in the media tab of her EMR. B12 and TSH were normal.   Chest pressure Patient has a history of mild non obs CAD. Multiple ED visits for similar complaints,  however this time she states that her symptoms begin with exertion and are relieve with rest. Her troponins are negative and her EKG is normal. This could represent stable angina. Repeat echo showed normal biventricular function without  evidence of hemodynamically significant valvular heart disease, similar to prior.    AKI on CKD Serum creatinine 3.39 recently and now found to be 4.37, likely in the setting of hypotension and poor oral intake. Renal ultrasound demonstrated Mild bilateral hydronephrosis which improves but does not completely resolve with bladder voiding,Increased cortical echogenicity bilaterally consistent with medical renal disease,Question a small amount of left perinephric fluid,Small postvoid residual in an otherwise normal appearing bladder. Protein:Cr elevated at 0.25 with 30 of protein on UA. Will need outpatient follow up.  T2DM A1c found to be 6.1. Protein:Cr elevated at 0.25.  Discharge Exam:   BP (!) 179/73 (BP Location: Left Arm)   Pulse 73   Temp 98.5 F (36.9 C) (Oral)   Resp 16   Ht '5\' 7"'$  (1.702 m)   Wt 80.3 kg   SpO2 100%  BMI 27.72 kg/m  Discharge exam:  GEN: Well nourished, well developed, pleasant, laying in bed, no acute distress HEENT: normocephalic, atraumatic, EOMI, sclera without icterus CV: RRR, no m/r/g PULM: normal work of breathing, LCTAB, good air movement throughout SKIN: warm and dry, normal skin turgor at the chest, trace edema of the bilateral feet, nails well groomed NEURO: mini cog 1/5, alert and oriented PSYCH: Juvenile sense of heightened mood and affect, perseverative, short term memory loss   Pertinent Labs, Studies, and Procedures:     Latest Ref Rng & Units 04/18/2022    1:54 AM 04/17/2022    7:49 PM 04/15/2022    5:53 PM  CBC  WBC 4.0 - 10.5 K/uL 5.8  6.9  5.8   Hemoglobin 12.0 - 15.0 g/dL 8.1  8.8  9.6   Hematocrit 36.0 - 46.0 % 25.2  27.3  30.4   Platelets 150 - 400 K/uL 216  240  283        Latest Ref Rng &  Units 04/18/2022    1:54 AM 04/17/2022    7:49 PM 04/15/2022    5:53 PM  BMP  Glucose 70 - 99 mg/dL 170  140  96   BUN 8 - 23 mg/dL 50  51  46   Creatinine 0.44 - 1.00 mg/dL 4.37  4.50  3.39   Sodium 135 - 145 mmol/L 141  138  143   Potassium 3.5 - 5.1 mmol/L 5.2  5.3  4.7   Chloride 98 - 111 mmol/L 110  108  111   CO2 22 - 32 mmol/L '23  20  23   '$ Calcium 8.9 - 10.3 mg/dL 8.4  8.7  9.1    ECHOCARDIOGRAM LIMITED  Result Date: 04/18/2022    ECHOCARDIOGRAM LIMITED REPORT   Patient Name:   JAYLENE ARROWOOD Date of Exam: 04/18/2022 Medical Rec #:  [id number]          Height:       67.0 in Accession #:    [id number]         Weight:       177.0 lb Date of Birth:  03/31/1951          BSA:          1.920 m Patient Age:    86 years           BP:           137/74 mmHg Patient Gender: F                  HR:           73 bpm. Exam Location:  Inpatient Procedure: Limited Echo, Limited Color Doppler and Cardiac Doppler Indications:    chest pain  History:        Patient has prior history of Echocardiogram examinations, most                 recent 06/11/2021. Cardiomyopathy; Risk Factors:Diabetes,                 Hypertension and Dyslipidemia.  Sonographer:    Johny Chess RDCS Referring Phys: [place]  1. Left ventricular ejection fraction, by estimation, is 55 to 60%. The left ventricle has normal function. The left ventricle has no regional wall motion abnormalities.  2. There is no evidence of cardiac tamponade.  3. The mitral valve is normal in structure. Trivial mitral valve regurgitation. No evidence of mitral stenosis.  4. There  is mild calcification of the aortic valve. There is mild thickening of the aortic valve. Aortic valve regurgitation is trivial. Aortic valve sclerosis is present, with no evidence of aortic valve stenosis.  5. The inferior vena cava is normal in size with <50% respiratory variability, suggesting right atrial pressure of 8 mmHg. Comparison(s): Prior images  reviewed side by side. Conclusion(s)/Recommendation(s): Normal biventricular function without evidence of hemodynamically significant valvular heart disease. FINDINGS  Left Ventricle: Left ventricular ejection fraction, by estimation, is 55 to 60%. The left ventricle has normal function. The left ventricle has no regional wall motion abnormalities. Pericardium: Trivial pericardial effusion is present. The pericardial effusion is circumferential. There is no evidence of cardiac tamponade. Mitral Valve: The mitral valve is normal in structure. Trivial mitral valve regurgitation. No evidence of mitral valve stenosis. Aortic Valve: There is mild calcification of the aortic valve. There is mild thickening of the aortic valve. Aortic valve regurgitation is trivial. Aortic regurgitation PHT measures 289 msec. Aortic valve sclerosis is present, with no evidence of aortic valve stenosis. Pulmonic Valve: The pulmonic valve was grossly normal. Pulmonic valve regurgitation is not visualized. Venous: The inferior vena cava is normal in size with less than 50% respiratory variability, suggesting right atrial pressure of 8 mmHg. LEFT VENTRICLE PLAX 2D LVIDd:         4.70 cm Diastology LVIDs:         3.20 cm LV e' medial:    6.74 cm/s LV PW:         1.00 cm LV E/e' medial:  17.7 LV IVS:        1.10 cm LV e' lateral:   7.94 cm/s                        LV E/e' lateral: 15.0  LEFT ATRIUM         Index LA diam:    4.70 cm 2.45 cm/m  AORTIC VALVE LVOT Vmax:   83.40 cm/s LVOT Vmean:  53.800 cm/s LVOT VTI:    0.164 m AI PHT:      289 msec  AORTA Ao Asc diam: 3.10 cm MITRAL VALVE MV Area (PHT): 3.99 cm     SHUNTS MV Decel Time: 190 msec     Systemic VTI: 0.16 m MV E velocity: 119.00 cm/s MV A velocity: 141.00 cm/s MV E/A ratio:  0.84 Buford Dresser MD Electronically signed by Buford Dresser MD Signature Date/Time: 04/18/2022/12:48:23 PM    Final    US RENAL  Result Date: 04/18/2022 CLINICAL DATA:  Acute renal  insufficiency EXAM: RENAL / URINARY TRACT ULTRASOUND COMPLETE COMPARISON:  None Available. FINDINGS: Right Kidney: Renal measurements: 8.5 x 4.0 x 4.2 cm = volume: 74 mL. Contains 2 cysts with the largest measuring 1.6 cm. No follow-up imaging recommended for the cysts. Mild hydronephrosis which improves but does not completely resolve with bladder voiding. Increased cortical echogenicity. Left Kidney: Renal measurements: 8.5 x 4.8 x 5.3 cm = volume: 111.5 mL. Contains an 11 mm cyst. No follow-up imaging recommended for this cyst. Mild hydronephrosis which improves but does not completely resolve with voiding. Increased cortical echogenicity. Question a small amount of perinephric fluid. Bladder: There is a small 20 cc postvoid residual in the bladder. The bladder is otherwise normal. Other: None. IMPRESSION: 1. Mild bilateral hydronephrosis which improves but does not completely resolve with bladder voiding. 2. Increased cortical echogenicity bilaterally consistent with medical renal disease. 3. Question a small amount of left perinephric  fluid. 4. Small postvoid residual in an otherwise normal appearing bladder. Electronically Signed   By: Dorise Bullion III M.D.   On: 04/18/2022 11:15   DG Chest 2 View  Result Date: 04/17/2022 CLINICAL DATA:  Lower extremity swelling EXAM: CHEST - 2 VIEW COMPARISON:  None Available. FINDINGS: Lungs are well expanded, symmetric, and clear. No pneumothorax or pleural effusion. Cardiac size within normal limits. Pulmonary vascularity is normal. Osseous structures are age-appropriate. No acute bone abnormality. IMPRESSION: No active cardiopulmonary disease. Electronically Signed   By: Fidela Salisbury M.D.   On: 04/17/2022 19:36     Discharge Instructions: Discharge Instructions     Diet - low sodium heart healthy   Complete by: As directed    Increase activity slowly   Complete by: As directed        Signed: Iona Coach, MD 04/18/2022, 2:15 PM   Pager: [phone]

## 2022-04-18 NOTE — Evaluation (Signed)
Occupational Therapy Evaluation Patient Details Name: Teresa Poole MRN: [id number] DOB: 1951-03-14 Today's Date: 04/18/2022   History of Present Illness 71 y/o femal presented to ED on 04/17/22 for dizziness, weakness and swelling in B LE x 2 days. Admitted for dizziness with unclear etiology and AKI on CKD. PMH: CKD IV, CHF, HTN, DM II, anemia, memory issues.   Clinical Impression   Patient admitted for the diagnosis above.  PTA she lives alone, continues to drive, and needed no assist for ADL, iADL, or mobility.  Patient states dizziness and weakness have resolved, and she is at her baseline.  No acute deficits identified, and no acute OT needs noted.  No post acute OT is anticipated.  Recommend following prescribing MD recommendations for post acute follow up.        Recommendations for follow up therapy are one component of a multi-disciplinary discharge planning process, led by the attending physician.  Recommendations may be updated based on patient status, additional functional criteria and insurance authorization.   Follow Up Recommendations  No OT follow up    Assistance Recommended at Discharge PRN  Patient can return home with the following Assist for transportation    Functional Status Assessment  Patient has not had a recent decline in their functional status  Equipment Recommendations  None recommended by OT    Recommendations for Other Services       Precautions / Restrictions Precautions Precautions: None Restrictions Weight Bearing Restrictions: No      Mobility Bed Mobility Overal bed mobility: Independent                  Transfers Overall transfer level: Independent Equipment used: None                      Balance Overall balance assessment: No apparent balance deficits (not formally assessed)                                         ADL either performed or assessed with clinical judgement   ADL Overall  ADL's : At baseline                                             Vision Patient Visual Report: No change from baseline       Perception Perception Perception: Within Functional Limits   Praxis Praxis Praxis: Intact    Pertinent Vitals/Pain Pain Assessment Pain Assessment: No/denies pain     Hand Dominance Right   Extremity/Trunk Assessment Upper Extremity Assessment Upper Extremity Assessment: Overall WFL for tasks assessed   Lower Extremity Assessment Lower Extremity Assessment: Defer to PT evaluation   Cervical / Trunk Assessment Cervical / Trunk Assessment: Normal   Communication Communication Communication: No difficulties   Cognition Arousal/Alertness: Awake/alert Behavior During Therapy: WFL for tasks assessed/performed Overall Cognitive Status: History of cognitive impairments - at baseline                                       General Comments   VSS    Exercises     Shoulder Instructions      Home Living Family/patient expects to be discharged to::  Private residence Living Arrangements: Alone Available Help at Discharge: Family;Available PRN/intermittently Type of Home: Apartment Home Access: Stairs to enter Entrance Stairs-Number of Steps: 15 Entrance Stairs-Rails: Right Home Layout: One level     Bathroom Shower/Tub: Teacher, early years/pre: Standard Bathroom Accessibility: Yes How Accessible: Accessible via walker Home Equipment: Cane - single point          Prior Functioning/Environment Prior Level of Function : Independent/Modified Independent;Driving             Mobility Comments: drives, denies falls ADLs Comments: pt reports full independence        OT Problem List: Decreased activity tolerance      OT Treatment/Interventions:      OT Goals(Current goals can be found in the care plan section) Acute Rehab OT Goals Patient Stated Goal: Hoping to return home OT Goal  Formulation: With patient Time For Goal Achievement: 04/20/22 Potential to Achieve Goals: Good  OT Frequency:      Co-evaluation              AM-PAC OT "6 Clicks" Daily Activity     Outcome Measure Help from another person eating meals?: None Help from another person taking care of personal grooming?: None Help from another person toileting, which includes using toliet, bedpan, or urinal?: None Help from another person bathing (including washing, rinsing, drying)?: None Help from another person to put on and taking off regular upper body clothing?: None Help from another person to put on and taking off regular lower body clothing?: None 6 Click Score: 24   End of Session Nurse Communication: Mobility status  Activity Tolerance: Patient tolerated treatment well Patient left: in bed;with call bell/phone within reach  OT Visit Diagnosis: Unsteadiness on feet (R26.81)                Time: 2094-7096 OT Time Calculation (min): 20 min Charges:  OT General Charges $OT Visit: 1 Visit OT Evaluation $OT Eval Moderate Complexity: 1 Mod  04/18/2022  RP, OTR/L  Acute Rehabilitation Services  Office:  [phone]   Metta Clines 04/18/2022, 10:58 AM

## 2022-04-18 NOTE — ED Notes (Signed)
Pt transported to the Echo department for an Echo.

## 2022-04-18 NOTE — Progress Notes (Signed)
  Echocardiogram 2D Echocardiogram has been performed.  Teresa Poole 04/18/2022, 9:46 AM

## 2022-04-18 NOTE — ED Notes (Signed)
Admitting MD mentioned pending possible likely d/c. Pt dressed, sitting in chair, verbalizing ready to go.

## 2022-04-18 NOTE — ED Notes (Signed)
Breakfast order placed ?

## 2022-04-23 ENCOUNTER — Encounter (HOSPITAL_COMMUNITY): Payer: Self-pay | Admitting: Emergency Medicine

## 2022-04-23 ENCOUNTER — Emergency Department (HOSPITAL_COMMUNITY)
Admission: EM | Admit: 2022-04-23 | Discharge: 2022-04-23 | Disposition: A | Discharge status: Home / Self Care | Payer: Medicare PPO | Attending: Emergency Medicine | Admitting: Emergency Medicine

## 2022-04-23 ENCOUNTER — Other Ambulatory Visit: Payer: Self-pay

## 2022-04-23 DIAGNOSIS — X58XXXA Exposure to other specified factors, initial encounter: Secondary | ICD-10-CM | POA: Insufficient documentation

## 2022-04-23 DIAGNOSIS — E1122 Type 2 diabetes mellitus with diabetic chronic kidney disease: Secondary | ICD-10-CM | POA: Diagnosis not present

## 2022-04-23 DIAGNOSIS — R58 Hemorrhage, not elsewhere classified: Secondary | ICD-10-CM

## 2022-04-23 DIAGNOSIS — I130 Hypertensive heart and chronic kidney disease with heart failure and stage 1 through stage 4 chronic kidney disease, or unspecified chronic kidney disease: Secondary | ICD-10-CM | POA: Diagnosis not present

## 2022-04-23 DIAGNOSIS — R609 Edema, unspecified: Secondary | ICD-10-CM

## 2022-04-23 DIAGNOSIS — S301XXA Contusion of abdominal wall, initial encounter: Secondary | ICD-10-CM | POA: Insufficient documentation

## 2022-04-23 DIAGNOSIS — I5040 Unspecified combined systolic (congestive) and diastolic (congestive) heart failure: Secondary | ICD-10-CM | POA: Diagnosis not present

## 2022-04-23 DIAGNOSIS — Z79899 Other long term (current) drug therapy: Secondary | ICD-10-CM | POA: Insufficient documentation

## 2022-04-23 DIAGNOSIS — D649 Anemia, unspecified: Secondary | ICD-10-CM | POA: Insufficient documentation

## 2022-04-23 DIAGNOSIS — N289 Disorder of kidney and ureter, unspecified: Secondary | ICD-10-CM | POA: Insufficient documentation

## 2022-04-23 DIAGNOSIS — S3991XA Unspecified injury of abdomen, initial encounter: Secondary | ICD-10-CM | POA: Diagnosis present

## 2022-04-23 DIAGNOSIS — N189 Chronic kidney disease, unspecified: Secondary | ICD-10-CM | POA: Insufficient documentation

## 2022-04-23 LAB — BASIC METABOLIC PANEL
Anion gap: 9 (ref 5–15)
BUN: 55 mg/dL — ABNORMAL HIGH (ref 8–23)
CO2: 24 mmol/L (ref 22–32)
Calcium: 9 mg/dL (ref 8.9–10.3)
Chloride: 107 mmol/L (ref 98–111)
Creatinine, Ser: 3.56 mg/dL — ABNORMAL HIGH (ref 0.44–1.00)
GFR, Estimated: 13 mL/min — ABNORMAL LOW (ref >60)
Glucose, Bld: 138 mg/dL — ABNORMAL HIGH (ref 70–99)
Potassium: 4.4 mmol/L (ref 3.5–5.1)
Sodium: 140 mmol/L (ref 135–145)

## 2022-04-23 LAB — CBC
HCT: 29.9 % — ABNORMAL LOW (ref 36.0–46.0)
Hemoglobin: 9.6 g/dL — ABNORMAL LOW (ref 12.0–15.0)
MCH: 29.4 pg (ref 26.0–34.0)
MCHC: 32.1 g/dL (ref 30.0–36.0)
MCV: 91.7 fL (ref 80.0–100.0)
Platelets: 270 10*3/uL (ref 150–400)
RBC: 3.26 MIL/uL — ABNORMAL LOW (ref 3.87–5.11)
RDW: 14.3 % (ref 11.5–15.5)
WBC: 5.9 10*3/uL (ref 4.0–10.5)
nRBC: 0 % (ref 0.0–0.2)

## 2022-04-23 MED ORDER — FUROSEMIDE 40 MG PO TABS: 40.0000 mg | ORAL_TABLET | Freq: Every day | ORAL | 0 refills | Status: DC

## 2022-04-23 NOTE — ED Triage Notes (Addendum)
Pt here with bruise to her RLQ, and a "light" headache. Denies injury. Pt reports taking ozempic and gives herself the injection on the right side.

## 2022-04-23 NOTE — ED Notes (Signed)
Patient verbalizes understanding of discharge instructions. Opportunity for questioning and answers were provided. Armband removed by staff, pt discharged from ED ambulatory.   

## 2022-04-23 NOTE — ED Provider Notes (Signed)
[facility] EMERGENCY DEPARTMENT Provider Note   CSN: [id number] Arrival date & time: 04/23/22  0008     History  Chief Complaint  Patient presents with   Bleeding/Bruising    Teresa Poole is a 71 y.o. female.  The history is provided by the patient.  She has history of hypertension, diabetes, hyperlipidemia, chronic kidney disease, combined systolic and diastolic heart failure and comes in because she noted a bruise on her right lower abdomen when she went to bed tonight.  It is not painful.  She does give herself an injection there once a week and this injection was given 3 days ago.  She denies abdominal pain, nausea, vomiting.  She also has noted some swelling in her feet.  She states her feet are normally swollen at the end of the day but usually go down when she puts them up.  For the last 2 nights, her foot swelling has not gone down.  She does admit to having too much salt in her diet.   Home Medications Prior to Admission medications   Medication Sig Start Date End Date Taking? Authorizing Provider  furosemide (LASIX) 40 MG tablet Take 1 tablet (40 mg total) by mouth daily after breakfast. 12/01/79  Yes Delora Fuel, MD  albuterol (VENTOLIN HFA) 108 (90 Base) MCG/ACT inhaler Inhale 2 puffs into the lungs every 4 (four) hours as needed for wheezing or shortness of breath. Patient not taking: Reported on 02/28/2022 10/23/21   [provider]  carvedilol (COREG) 25 MG tablet Take 25 mg by mouth 2 (two) times daily with a meal.    [provider]  cetirizine (ZYRTEC) 10 MG tablet Take 10 mg by mouth daily as needed for allergies.    [provider]  cycloSPORINE, PF, (CEQUA) 0.09 % SOLN Place 1 drop into both eyes 2 (two) times daily. 12/18/20   [provider]  famotidine (PEPCID) 40 MG tablet Take 40 mg by mouth daily. 09/04/21   [provider]  fluticasone (FLONASE) 50 MCG/ACT nasal spray Place 2 sprays into both  nostrils daily.    [provider]  glucose blood (ONE TOUCH ULTRA TEST) test strip Use to check blood sugar 3 times daily Dx code E11.65 Patient taking differently: 1 each by Other route See admin instructions. Use to check blood sugar 3 times daily Dx code E11.65 11/20/14   Elayne Snare, MD  meclizine (ANTIVERT) 25 MG tablet Take 25 mg by mouth 3 (three) times daily as needed for dizziness.    [provider]  omeprazole (PRILOSEC) 40 MG capsule Take 40 mg by mouth daily. 03/10/22   [provider]  ONETOUCH VERIO test strip [place] Patient taking differently: 1 each by Other route in the morning, at noon, and at bedtime. 03/30/18   Elayne Snare, MD  OZEMPIC, 0.25 OR 0.5 MG/DOSE, 2 MG/1.5ML SOPN Inject 0.25 mg into the skin once a week. Sundays 05/12/21   [provider]  polyethylene glycol (MIRALAX / GLYCOLAX) 17 g packet Take 17 g by mouth daily. Patient not taking: Reported on 10/25/2021 09/28/21   Eugenie Filler, MD  rosuvastatin (CRESTOR) 20 MG tablet Take 20 mg by mouth daily.    [provider]      Allergies    Hydralazine hcl and Atorvastatin    Review of Systems   Review of Systems  All other systems reviewed and are negative.   Physical Exam Updated Vital  Signs BP 133/86   Pulse 87   Temp 99.1 F (37.3 C)   Resp 17   SpO2 98%  Physical Exam Vitals and nursing note reviewed.   71 year old female, resting comfortably and in no acute distress. Vital signs are normal. Oxygen saturation is 98%, which is normal. Head is normocephalic and atraumatic. PERRLA, EOMI. Oropharynx is clear. Neck is nontender and supple without adenopathy or JVD. Back is nontender and there is no CVA tenderness. Lungs are clear without rales, wheezes, or rhonchi. Chest is nontender. Heart has regular rate and rhythm without murmur. Abdomen is soft, flat, nontender.  Ecchymosis is present in the right lower abdomen which appears  to-31 days old based on color changes. Extremities have 1-2+ pedal edema, full range of motion is present. Skin is warm and dry without rash. Neurologic: Mental status is normal, cranial nerves are intact, moves all extremities equally.  ED Results / Procedures / Treatments   Labs (all labs ordered are listed, but only abnormal results are displayed) Labs Reviewed  BASIC METABOLIC PANEL - Abnormal; Notable for the following components:      Result Value   Glucose, Bld 138 (*)    BUN 55 (*)    Creatinine, Ser 3.56 (*)    GFR, Estimated 13 (*)    All other components within normal limits  CBC - Abnormal; Notable for the following components:   RBC 3.26 (*)    Hemoglobin 9.6 (*)    HCT 29.9 (*)    All other components within normal limits   Procedures Procedures    Medications Ordered in ED Medications - No data to display  ED Course/ Medical Decision Making/ A&P                           Medical Decision Making Amount and/or Complexity of Data Reviewed Labs: ordered.  Risk Prescription drug management.   Ecchymosis of the right lower abdomen without evidence of significant trauma.  This appears to have been related to her recent injection of her diabetes medicine.  I have reviewed and interpreted her laboratory tests and my interpretation is stable anemia, stable renal insufficiency.  Patient is reassured that her ecchymosis is not cause for concern, no need for imaging.  Pedal edema secondary to commendation of heart failure, renal insufficiency, dietary indiscretions.  Patient is encouraged to stay on a low-salt diet.  I am giving her a 3-day prescription for furosemide to try to reduce the swelling.  If further diuretics are needed, she will need to get them from either her primary care provider or her nephrologist.  I am concerned about giving her diuretics in the setting of stage IV kidney disease and that I do not wish to aggravate her pre-existing renal insufficiency.  I  have recommended follow-up with primary care provider in 1 week.  Final Clinical Impression(s) / ED Diagnoses Final diagnoses:  Ecchymosis  Peripheral edema  Renal insufficiency    Rx / DC Orders ED Discharge Orders          Ordered    furosemide (LASIX) 40 MG tablet  Daily after breakfast        04/23/22 3220              Delora Fuel, MD [birth year] [phone]

## 2022-04-23 NOTE — Discharge Instructions (Signed)
Your bruise is not anything serious and is probably related to your injection on Sunday.  Your foot swelling is related to extra salt in your diet.  Please stay on a low-salt diet.  I have prescribed a diuretic (water pill) to take for the next 3 days.  Please work with your primary care provider and your kidney doctor regarding your kidney disease and foot swelling.

## 2022-05-14 ENCOUNTER — Emergency Department (HOSPITAL_COMMUNITY)
Admission: EM | Admit: 2022-05-14 | Discharge: 2022-05-15 | Disposition: A | Discharge status: Home / Self Care | Payer: Medicare PPO | Attending: Emergency Medicine | Admitting: Emergency Medicine

## 2022-05-14 ENCOUNTER — Encounter (HOSPITAL_COMMUNITY): Payer: Self-pay

## 2022-05-14 ENCOUNTER — Other Ambulatory Visit: Payer: Self-pay

## 2022-05-14 ENCOUNTER — Emergency Department (HOSPITAL_COMMUNITY): Payer: Medicare PPO

## 2022-05-14 DIAGNOSIS — J3489 Other specified disorders of nose and nasal sinuses: Secondary | ICD-10-CM | POA: Diagnosis present

## 2022-05-14 DIAGNOSIS — Z20822 Contact with and (suspected) exposure to covid-19: Secondary | ICD-10-CM | POA: Diagnosis not present

## 2022-05-14 DIAGNOSIS — T7840XA Allergy, unspecified, initial encounter: Secondary | ICD-10-CM | POA: Insufficient documentation

## 2022-05-14 DIAGNOSIS — R067 Sneezing: Secondary | ICD-10-CM | POA: Diagnosis not present

## 2022-05-14 DIAGNOSIS — N189 Chronic kidney disease, unspecified: Secondary | ICD-10-CM | POA: Insufficient documentation

## 2022-05-14 DIAGNOSIS — E1122 Type 2 diabetes mellitus with diabetic chronic kidney disease: Secondary | ICD-10-CM | POA: Diagnosis not present

## 2022-05-14 DIAGNOSIS — I129 Hypertensive chronic kidney disease with stage 1 through stage 4 chronic kidney disease, or unspecified chronic kidney disease: Secondary | ICD-10-CM | POA: Insufficient documentation

## 2022-05-14 DIAGNOSIS — Z79899 Other long term (current) drug therapy: Secondary | ICD-10-CM | POA: Diagnosis not present

## 2022-05-14 DIAGNOSIS — H5789 Other specified disorders of eye and adnexa: Secondary | ICD-10-CM | POA: Insufficient documentation

## 2022-05-14 LAB — SARS CORONAVIRUS 2 BY RT PCR: SARS Coronavirus 2 by RT PCR: NEGATIVE

## 2022-05-14 NOTE — ED Triage Notes (Addendum)
Patient reports URI symptoms, congestion left ear pain sinus pressure.  Reports nasal congestion and symptoms started 40 mins pta while sitting on the bench.  Denies CP SOB

## 2022-05-14 NOTE — ED Provider Triage Note (Signed)
Emergency Medicine Provider Triage Evaluation Note  KERRY-ANNE MEZO , a 71 y.o. female  was evaluated in triage.  Pt complains of URI-like symptoms onset prior to arrival.  Has associated rhinorrhea, nasal congestion, cough.  No meds tried prior to arrival.  Denies chest pain or shortness of breath.  Review of Systems  Positive:  Negative:   Physical Exam  BP (!) 140/69 (BP Location: Right Arm)   Pulse 66   Temp 98.7 F (37.1 C) (Oral)   Resp 18   Ht '5\' 7"'$  (1.702 m)   Wt 80.3 kg   SpO2 100%   BMI 27.72 kg/m  Gen:   Awake, no distress   Resp:  Normal effort  MSK:   Moves extremities without difficulty  Other:    Medical Decision Making  Medically screening exam initiated at 6:28 PM.  Appropriate orders placed.  ANIELA CANIGLIA was informed that the remainder of the evaluation will be completed by another provider, this initial triage assessment does not replace that evaluation, and the importance of remaining in the ED until their evaluation is complete.  Work-up initiated   Eternity Dexter A, PA-C 05/14/22 1829

## 2022-05-14 NOTE — ED Provider Notes (Signed)
[facility] EMERGENCY DEPARTMENT Provider Note   CSN: [id number] Arrival date & time: 05/14/22  1718     History  Chief Complaint  Patient presents with   URI    Teresa Poole is a 71 y.o. female with a past medical history of hypertension, type 2 diabetes and chronic kidney disease presenting today with URI symptoms.  She reports that she was sitting on a bench in a park earlier today when all of a sudden she started to have a "hayfever attack."  Endorsing rhinorrhea, sneezing, watery eyes and throat tickling.   URI      Home Medications Prior to Admission medications   Medication Sig Start Date End Date Taking? Authorizing Provider  albuterol (VENTOLIN HFA) 108 (90 Base) MCG/ACT inhaler Inhale 2 puffs into the lungs every 4 (four) hours as needed for wheezing or shortness of breath. Patient not taking: Reported on 02/28/2022 10/23/21   [provider]  carvedilol (COREG) 25 MG tablet Take 25 mg by mouth 2 (two) times daily with a meal.    [provider]  cetirizine (ZYRTEC) 10 MG tablet Take 10 mg by mouth daily as needed for allergies.    [provider]  cycloSPORINE, PF, (CEQUA) 0.09 % SOLN Place 1 drop into both eyes 2 (two) times daily. 12/18/20   [provider]  famotidine (PEPCID) 40 MG tablet Take 40 mg by mouth daily. 09/04/21   [provider]  fluticasone (FLONASE) 50 MCG/ACT nasal spray Place 2 sprays into both nostrils daily.    [provider]  furosemide (LASIX) 40 MG tablet Take 1 tablet (40 mg total) by mouth daily after breakfast. 8/92/11   Delora Fuel, MD  glucose blood (ONE TOUCH ULTRA TEST) test strip Use to check blood sugar 3 times daily Dx code E11.65 Patient taking differently: 1 each by Other route See admin instructions. Use to check blood sugar 3 times daily Dx code E11.65 11/20/14   Elayne Snare, MD  meclizine (ANTIVERT) 25 MG tablet Take 25 mg by mouth 3 (three) times daily as  needed for dizziness.    [provider]  omeprazole (PRILOSEC) 40 MG capsule Take 40 mg by mouth daily. 03/10/22   [provider]  ONETOUCH VERIO test strip [place] Patient taking differently: 1 each by Other route in the morning, at noon, and at bedtime. 03/30/18   Elayne Snare, MD  OZEMPIC, 0.25 OR 0.5 MG/DOSE, 2 MG/1.5ML SOPN Inject 0.25 mg into the skin once a week. Sundays 05/12/21   [provider]  polyethylene glycol (MIRALAX / GLYCOLAX) 17 g packet Take 17 g by mouth daily. Patient not taking: Reported on 10/25/2021 09/28/21   Eugenie Filler, MD  rosuvastatin (CRESTOR) 20 MG tablet Take 20 mg by mouth daily.    [provider]      Allergies    Hydralazine hcl and Atorvastatin    Review of Systems   Review of Systems  Physical Exam Updated Vital Signs BP (!) 142/72   Pulse 73   Temp 98.4 F (36.9 C) (Oral)   Resp 18   Ht '5\' 7"'$  (1.702 m)   Wt 80.3 kg   SpO2 100%   BMI 27.72 kg/m  Physical Exam Vitals and nursing note reviewed.  Constitutional:      General: She is not in acute distress.    Appearance: Normal appearance. She is not ill-appearing.  HENT:     Head: Normocephalic  and atraumatic.     Nose: Nose normal. No congestion or rhinorrhea.     Mouth/Throat:     Mouth: Mucous membranes are moist.     Pharynx: Oropharynx is clear. No oropharyngeal exudate or posterior oropharyngeal erythema.  Eyes:     General: No scleral icterus.    Conjunctiva/sclera: Conjunctivae normal.  Cardiovascular:     Rate and Rhythm: Normal rate and regular rhythm.  Pulmonary:     Effort: Pulmonary effort is normal. No respiratory distress.     Breath sounds: No wheezing.  Abdominal:     General: Abdomen is flat.     Palpations: Abdomen is soft.  Musculoskeletal:     Cervical back: No tenderness.  Skin:    General: Skin is warm and dry.     Findings: No rash.  Neurological:     Mental Status: She is alert.   Psychiatric:        Mood and Affect: Mood normal.     ED Results / Procedures / Treatments   Labs (all labs ordered are listed, but only abnormal results are displayed) Labs Reviewed  SARS CORONAVIRUS 2 BY RT PCR    EKG None  Radiology DG Chest 2 View  Result Date: 05/14/2022 CLINICAL DATA:  Cough. feeling of cotton or heaviness in chest." Hx diabetic. Notes per triage:Patient reports URI symptoms, congestion left ear pain sinus pressure. EXAM: CHEST - 2 VIEW COMPARISON:  Chest x-ray 04/17/2022 FINDINGS: The heart and mediastinal contours are unchanged. Aortic calcification. No focal consolidation. No pulmonary edema. No pleural effusion. No pneumothorax. No acute osseous abnormality. IMPRESSION: 1. No active cardiopulmonary disease. 2.  Aortic Atherosclerosis (ICD10-I70.0). Electronically Signed   By: Iven Finn M.D.   On: 05/14/2022 18:54    Procedures Procedures   Medications Ordered in ED Medications - No data to display  ED Course/ Medical Decision Making/ A&P                           Medical Decision Making  71 year old female presenting today due to allergies after sitting on a park.  Reports that it was acute in onset.  She believes it is because people were cutting grass in the park.  No shortness of breath or chest pain.  COVID was negative.  X-ray ordered in triage, viewed and interpreted by me.  Negative for any acute findings.  My physical exam is benign.  No findings consistent with patient's history.  I debated treating the patient with antihistamine in the department however she is driving today.  I do not want to give her anything that will make her drowsy being that she is elderly and presenting with mild illness.  She says that she has Flonase at home and takes either Zyrtec or Claritin every day.  Agreeable to discharge at this time with instructions not to spend any more time in the park over the next few days.   Final Clinical Impression(s) / ED  Diagnoses Final diagnoses:  Allergic reaction, initial encounter    Rx / DC Orders ED Discharge Orders     None      Results and diagnoses were explained to the patient. Return precautions discussed in full. Patient had no additional questions and expressed complete understanding.   This chart was dictated using voice recognition software.  Despite best efforts to proofread,  errors can occur which can change the documentation meaning.    Rhae Hammock, PA-C 05/14/22 2333  Tretha Sciara, MD 05/15/22 [phone]

## 2022-05-14 NOTE — Discharge Instructions (Signed)
Your COVID test is negative today.  You also do not have any abnormalities on your chest x-ray.  I believe you have had an allergy attack from sitting outside.  Please start taking your antihistamine at home.  It appears as though you are prescribed cetirizine which is the same as Zyrtec.  Please take this every night.  Additionally, restart your nasal spray for allergies.  I do not want you sitting outside in the park or in your complex for the next 3 days and if you continue to have allergy symptoms, continue to avoid sitting outside.  Follow-up with your PCP as needed.  It was a pleasure to meet you today we hope you feel better!

## 2022-05-22 ENCOUNTER — Emergency Department (HOSPITAL_COMMUNITY): Payer: Medicare PPO

## 2022-05-22 ENCOUNTER — Other Ambulatory Visit: Payer: Self-pay

## 2022-05-22 ENCOUNTER — Emergency Department (HOSPITAL_COMMUNITY)
Admission: EM | Admit: 2022-05-22 | Discharge: 2022-05-23 | Disposition: A | Discharge status: Home / Self Care | Payer: Medicare PPO | Attending: Emergency Medicine | Admitting: Emergency Medicine

## 2022-05-22 DIAGNOSIS — R12 Heartburn: Secondary | ICD-10-CM | POA: Insufficient documentation

## 2022-05-22 DIAGNOSIS — M542 Cervicalgia: Secondary | ICD-10-CM | POA: Diagnosis not present

## 2022-05-22 DIAGNOSIS — M62838 Other muscle spasm: Secondary | ICD-10-CM | POA: Insufficient documentation

## 2022-05-22 LAB — COMPREHENSIVE METABOLIC PANEL
ALT: 12 U/L (ref 0–44)
AST: 18 U/L (ref 15–41)
Albumin: 3.5 g/dL (ref 3.5–5.0)
Alkaline Phosphatase: 40 U/L (ref 38–126)
Anion gap: 11 (ref 5–15)
BUN: 57 mg/dL — ABNORMAL HIGH (ref 8–23)
CO2: 22 mmol/L (ref 22–32)
Calcium: 9.2 mg/dL (ref 8.9–10.3)
Chloride: 108 mmol/L (ref 98–111)
Creatinine, Ser: 3.53 mg/dL — ABNORMAL HIGH (ref 0.44–1.00)
GFR, Estimated: 13 mL/min — ABNORMAL LOW (ref >60)
Glucose, Bld: 109 mg/dL — ABNORMAL HIGH (ref 70–99)
Potassium: 5.1 mmol/L (ref 3.5–5.1)
Sodium: 141 mmol/L (ref 135–145)
Total Bilirubin: 0.2 mg/dL — ABNORMAL LOW (ref 0.3–1.2)
Total Protein: 6.4 g/dL — ABNORMAL LOW (ref 6.5–8.1)

## 2022-05-22 LAB — CBC WITH DIFFERENTIAL/PLATELET
Abs Immature Granulocytes: 0.01 10*3/uL (ref 0.00–0.07)
Basophils Absolute: 0 10*3/uL (ref 0.0–0.1)
Basophils Relative: 0 %
Eosinophils Absolute: 0.1 10*3/uL (ref 0.0–0.5)
Eosinophils Relative: 2 %
HCT: 28.6 % — ABNORMAL LOW (ref 36.0–46.0)
Hemoglobin: 9.3 g/dL — ABNORMAL LOW (ref 12.0–15.0)
Immature Granulocytes: 0 %
Lymphocytes Relative: 37 %
Lymphs Abs: 1.8 10*3/uL (ref 0.7–4.0)
MCH: 28.9 pg (ref 26.0–34.0)
MCHC: 32.5 g/dL (ref 30.0–36.0)
MCV: 88.8 fL (ref 80.0–100.0)
Monocytes Absolute: 0.4 10*3/uL (ref 0.1–1.0)
Monocytes Relative: 8 %
Neutro Abs: 2.5 10*3/uL (ref 1.7–7.7)
Neutrophils Relative %: 53 %
Platelets: 215 10*3/uL (ref 150–400)
RBC: 3.22 MIL/uL — ABNORMAL LOW (ref 3.87–5.11)
RDW: 13 % (ref 11.5–15.5)
WBC: 4.9 10*3/uL (ref 4.0–10.5)
nRBC: 0 % (ref 0.0–0.2)

## 2022-05-22 LAB — TROPONIN I (HIGH SENSITIVITY)
Troponin I (High Sensitivity): 16 ng/L (ref <18)
Troponin I (High Sensitivity): 15 ng/L (ref <18)

## 2022-05-22 LAB — LIPASE, BLOOD: Lipase: 48 U/L (ref 11–51)

## 2022-05-22 NOTE — ED Triage Notes (Signed)
Pt here for "burning" from her "eyes to the rectum". Pt states this started today, denies sob/cp, cough.

## 2022-05-22 NOTE — ED Provider Triage Note (Signed)
Emergency Medicine Provider Triage Evaluation Note  NEISHA HINGER , a 71 y.o. female  was evaluated in triage.  Pt complains of acid reflux.  Patient complains of acid tears and saliva and acid feeling from her eyes to her rectum.  She also complains of reflux.  She states it started while she was driving denies chest pain or shortness of breath.  Review of Systems  Positive: Acid sensation Negative: sob  Physical Exam  BP 113/62 (BP Location: Right Arm)   Pulse 72   Temp 99.1 F (37.3 C) (Oral)   Resp 16   SpO2 100%  Gen:   Awake, no distress   Resp:  Normal effort  MSK:   Moves extremities without difficulty  Other:  Bizzare affect  Medical Decision Making  Medically screening exam initiated at 4:16 PM.  Appropriate orders placed.  ESME FREUND was informed that the remainder of the evaluation will be completed by another provider, this initial triage assessment does not replace that evaluation, and the importance of remaining in the ED until their evaluation is complete.  Work up initiated   [employer], PA-C 05/22/22 1618

## 2022-05-23 MED ORDER — LIDOCAINE 5 % EX PTCH
1.0000 | MEDICATED_PATCH | CUTANEOUS | Status: DC
  Administered 2022-05-23: 1 via TRANSDERMAL
  Filled 2022-05-23: qty 1

## 2022-05-23 MED ORDER — SUCRALFATE 1 G PO TABS: 1.0000 g | ORAL_TABLET | Freq: Three times a day (TID) | ORAL | 0 refills | Status: DC

## 2022-05-23 MED ORDER — ALUM & MAG HYDROXIDE-SIMETH 200-200-20 MG/5ML PO SUSP
30.0000 mL | Freq: Once | ORAL | Status: AC
  Administered 2022-05-23: 30 mL via ORAL
  Filled 2022-05-23: qty 30

## 2022-05-23 NOTE — ED Notes (Signed)
RN reviewed discharge instructions with pt. Pt verbalized understanding and had no further questions. VSS upon discharge.  

## 2022-05-23 NOTE — ED Notes (Signed)
Pts neck/chest pain has improved, now able to turn neck left and right

## 2022-05-23 NOTE — ED Provider Notes (Signed)
[facility] Emergency Department Provider Note MRN:  [id number]  Arrival date & time: 05/23/22     Chief Complaint   Heartburn   History of Present Illness   Teresa Poole is a 71 y.o. year-old female presents to the ED with chief complaint of acid reflux.  She states that she has had horrible acid reflux all day.  She states she normally takes Mylanta, but didn't take any today.  She also complains of left sided neck pain, which she describes as a tightness/stiffness.  She states that she woke up with this pain.  History provided by patient.   Review of Systems  Pertinent positive and negative review of systems noted in HPI.    Physical Exam   Vitals:   05/23/22 0315 05/23/22 0345  BP: 116/62   Pulse: 71   Resp: 16   Temp:  98 F (36.7 C)  SpO2: 100%     CONSTITUTIONAL:  well-appearing, NAD NEURO:  Alert and oriented x 3, CN 3-12 grossly intact EYES:  eyes equal and reactive ENT/NECK:  Supple, no stridor  CARDIO:  normal rate, regular rhythm, appears well-perfused  PULM:  No respiratory distress, CTAB GI/GU:  non-distended,  MSK/SPINE:  No gross deformities, no edema, moves all extremities, left upper trap tight and TTP, consistent with spasm SKIN:  no rash, atraumatic   *Additional and/or pertinent findings included in MDM below  Diagnostic and Interventional Summary    EKG Interpretation  Date/Time:  Friday May 22 2022 16:08:28 EDT Ventricular Rate:  70 PR Interval:  184 QRS Duration: 142 QT Interval:  442 QTC Calculation: 477 R Axis:   -19 Text Interpretation: Normal sinus rhythm Right bundle branch block Confirmed by Palumbo, April ([id number]) on 05/23/2022 12:37:14 AM       Labs Reviewed  COMPREHENSIVE METABOLIC PANEL - Abnormal; Notable for the following components:      Result Value   Glucose, Bld 109 (*)    BUN 57 (*)    Creatinine, Ser 3.53 (*)    Total Protein 6.4 (*)    Total Bilirubin 0.2 (*)    GFR,  Estimated 13 (*)    All other components within normal limits  CBC WITH DIFFERENTIAL/PLATELET - Abnormal; Notable for the following components:   RBC 3.22 (*)    Hemoglobin 9.3 (*)    HCT 28.6 (*)    All other components within normal limits  LIPASE, BLOOD  TROPONIN I (HIGH SENSITIVITY)  TROPONIN I (HIGH SENSITIVITY)    DG ABD ACUTE 2+V W 1V CHEST  Final Result      Medications  lidocaine (LIDODERM) 5 % 1 patch (1 patch Transdermal Patch Applied 05/23/22 0139)  alum & mag hydroxide-simeth (MAALOX/MYLANTA) 200-200-20 MG/5ML suspension 30 mL (30 mLs Oral Given 05/23/22 0139)     Procedures  /  Critical Care Procedures  ED Course and Medical Decision Making  I have reviewed the triage vital signs, the nursing notes, and pertinent available records from the EMR.  Social Determinants Affecting Complexity of Care: Patient .   ED Course:    Medical Decision Making Patient here with acid reflux and neck tightness.  The neck tightness seems to be musculoskeletal and is easily reproducible.  She awakened with the symptoms this morning.  We will give Lidoderm patch and apply heat will reassess.  Acid reflux is not a new symptom for this patient.  She normally takes Mylanta, but did not take any.  I will give some antacid  here and prescribe Carafate.  Laboratory work-up ordered in triage is reassuring.  Patient has flat troponins, her symptoms do not sound like ACS.  She is not hypoxic.  CXR is clear.  Doubt PE or pneumonia.  Her creatinine is about baseline.  Patient reassessed, she states she is feeling improved.  We will plan for discharge and outpatient follow-up.  Amount and/or Complexity of Data Reviewed Labs:     Details: As above Radiology:     Details: As above  Risk OTC drugs. Prescription drug management.     Consultants: No consultations were needed in caring for this patient.   Treatment and Plan: I considered admission due to patient's initial presentation,  but after considering the examination and diagnostic results, patient will not require admission and can be discharged with outpatient follow-up.    Final Clinical Impressions(s) / ED Diagnoses     ICD-10-CM   1. Heart burn  R12     2. Muscle spasms of neck  M62.838       ED Discharge Orders          Ordered    sucralfate (CARAFATE) 1 g tablet  3 times daily with meals & bedtime        05/23/22 0331              Discharge Instructions Discussed with and Provided to Patient:   Discharge Instructions   None      Montine Circle, PA-C 05/23/22 [place], April, MD 05/23/22 1610

## 2022-05-30 ENCOUNTER — Other Ambulatory Visit: Payer: Self-pay

## 2022-05-30 ENCOUNTER — Encounter (HOSPITAL_COMMUNITY): Payer: Self-pay

## 2022-05-30 ENCOUNTER — Emergency Department (HOSPITAL_COMMUNITY)
Admission: EM | Admit: 2022-05-30 | Discharge: 2022-05-31 | Disposition: A | Discharge status: Home / Self Care | Payer: Medicare PPO | Attending: Emergency Medicine | Admitting: Emergency Medicine

## 2022-05-30 ENCOUNTER — Emergency Department (HOSPITAL_COMMUNITY): Payer: Medicare PPO

## 2022-05-30 DIAGNOSIS — N189 Chronic kidney disease, unspecified: Secondary | ICD-10-CM | POA: Diagnosis not present

## 2022-05-30 DIAGNOSIS — I129 Hypertensive chronic kidney disease with stage 1 through stage 4 chronic kidney disease, or unspecified chronic kidney disease: Secondary | ICD-10-CM | POA: Diagnosis not present

## 2022-05-30 DIAGNOSIS — J00 Acute nasopharyngitis [common cold]: Secondary | ICD-10-CM | POA: Insufficient documentation

## 2022-05-30 DIAGNOSIS — R0789 Other chest pain: Secondary | ICD-10-CM | POA: Diagnosis present

## 2022-05-30 DIAGNOSIS — Z79899 Other long term (current) drug therapy: Secondary | ICD-10-CM | POA: Diagnosis not present

## 2022-05-30 DIAGNOSIS — E1122 Type 2 diabetes mellitus with diabetic chronic kidney disease: Secondary | ICD-10-CM | POA: Diagnosis not present

## 2022-05-30 LAB — CBC
HCT: 33.4 % — ABNORMAL LOW (ref 36.0–46.0)
Hemoglobin: 10.2 g/dL — ABNORMAL LOW (ref 12.0–15.0)
MCH: 29.4 pg (ref 26.0–34.0)
MCHC: 30.5 g/dL (ref 30.0–36.0)
MCV: 96.3 fL (ref 80.0–100.0)
Platelets: 243 10*3/uL (ref 150–400)
RBC: 3.47 MIL/uL — ABNORMAL LOW (ref 3.87–5.11)
RDW: 13.2 % (ref 11.5–15.5)
WBC: 6.6 10*3/uL (ref 4.0–10.5)
nRBC: 0 % (ref 0.0–0.2)

## 2022-05-30 LAB — BASIC METABOLIC PANEL
Anion gap: 10 (ref 5–15)
BUN: 58 mg/dL — ABNORMAL HIGH (ref 8–23)
CO2: 21 mmol/L — ABNORMAL LOW (ref 22–32)
Calcium: 8.8 mg/dL — ABNORMAL LOW (ref 8.9–10.3)
Chloride: 109 mmol/L (ref 98–111)
Creatinine, Ser: 3.31 mg/dL — ABNORMAL HIGH (ref 0.44–1.00)
GFR, Estimated: 14 mL/min — ABNORMAL LOW (ref >60)
Glucose, Bld: 115 mg/dL — ABNORMAL HIGH (ref 70–99)
Potassium: 5.1 mmol/L (ref 3.5–5.1)
Sodium: 140 mmol/L (ref 135–145)

## 2022-05-30 LAB — TROPONIN I (HIGH SENSITIVITY): Troponin I (High Sensitivity): 12 ng/L (ref <18)

## 2022-05-30 NOTE — ED Triage Notes (Signed)
Pt reports with chest pressure x 30 mins ago. Pt reports that she ate recently and thinks that she may be having indigestion. Pt states that her pressure goes into her left shoulder.

## 2022-05-31 LAB — TROPONIN I (HIGH SENSITIVITY): Troponin I (High Sensitivity): 12 ng/L (ref <18)

## 2022-05-31 MED ORDER — ALUM & MAG HYDROXIDE-SIMETH 200-200-20 MG/5ML PO SUSP
15.0000 mL | Freq: Once | ORAL | Status: AC
  Administered 2022-05-31: 15 mL via ORAL
  Filled 2022-05-31: qty 30

## 2022-05-31 NOTE — ED Provider Notes (Signed)
[facility] DEPT Provider Note   CSN: [id number] Arrival date & time: 05/30/22  2129     History  Chief Complaint  Patient presents with   Chest Pain    Teresa Poole is a 71 y.o. female.  The history is provided by the patient and medical records.  Chest Pain Teresa Poole is a 71 y.o. female who presents to the Emergency Department complaining of indigestion. She presents to the ED for evaluation of chest pain that she describes as indigestion and gas pain.  Sxs started about 20 minutes after leaving [place].    No nausea, diaphoresis.  Feels like she has a light cold.  No abdominal pain.  No leg swelling/pain.    Compliant with home meds.  Family hx/o CHF.   Has a hx/o HTN, DM, CKD.       Home Medications Prior to Admission medications   Medication Sig Start Date End Date Taking? Authorizing Provider  albuterol (VENTOLIN HFA) 108 (90 Base) MCG/ACT inhaler Inhale 2 puffs into the lungs every 4 (four) hours as needed for wheezing or shortness of breath. Patient not taking: Reported on 02/28/2022 10/23/21   [provider]  carvedilol (COREG) 25 MG tablet Take 25 mg by mouth 2 (two) times daily with a meal.    [provider]  cetirizine (ZYRTEC) 10 MG tablet Take 10 mg by mouth daily as needed for allergies.    [provider]  cycloSPORINE, PF, (CEQUA) 0.09 % SOLN Place 1 drop into both eyes 2 (two) times daily. 12/18/20   [provider]  famotidine (PEPCID) 40 MG tablet Take 40 mg by mouth daily. 09/04/21   [provider]  fluticasone (FLONASE) 50 MCG/ACT nasal spray Place 2 sprays into both nostrils daily.    [provider]  furosemide (LASIX) 40 MG tablet Take 1 tablet (40 mg total) by mouth daily after breakfast. 8/75/64   Delora Fuel, MD  glucose blood (ONE TOUCH ULTRA TEST) test strip Use to check blood sugar 3 times daily Dx code E11.65 Patient taking differently: 1 each  by Other route See admin instructions. Use to check blood sugar 3 times daily Dx code E11.65 11/20/14   Elayne Snare, MD  meclizine (ANTIVERT) 25 MG tablet Take 25 mg by mouth 3 (three) times daily as needed for dizziness.    [provider]  omeprazole (PRILOSEC) 40 MG capsule Take 40 mg by mouth daily. 03/10/22   [provider]  ONETOUCH VERIO test strip [place] Patient taking differently: 1 each by Other route in the morning, at noon, and at bedtime. 03/30/18   Elayne Snare, MD  OZEMPIC, 0.25 OR 0.5 MG/DOSE, 2 MG/1.5ML SOPN Inject 0.25 mg into the skin once a week. Sundays 05/12/21   [provider]  polyethylene glycol (MIRALAX / GLYCOLAX) 17 g packet Take 17 g by mouth daily. Patient not taking: Reported on 10/25/2021 09/28/21   Eugenie Filler, MD  rosuvastatin (CRESTOR) 20 MG tablet Take 20 mg by mouth daily.    [provider]  sucralfate (CARAFATE) 1 g tablet Take 1 tablet (1 g total) by mouth 4 (four) times daily -  with meals and at bedtime. 05/23/22   Montine Circle, PA-C      Allergies    Hydralazine hcl and Atorvastatin    Review of Systems   Review of Systems  Cardiovascular:  Positive for chest pain.  All other systems reviewed  and are negative.   Physical Exam Updated Vital Signs BP (!) 108/56   Pulse 77   Temp 98.4 F (36.9 C) (Oral)   Resp 18   Ht '5\' 7"'$  (1.702 m)   Wt 70.3 kg   SpO2 99%   BMI 24.28 kg/m  Physical Exam Vitals and nursing note reviewed.  Constitutional:      Appearance: She is well-developed.  HENT:     Head: Normocephalic and atraumatic.  Cardiovascular:     Rate and Rhythm: Normal rate and regular rhythm.     Heart sounds: No murmur heard. Pulmonary:     Effort: Pulmonary effort is normal. No respiratory distress.     Breath sounds: Normal breath sounds.  Abdominal:     Palpations: Abdomen is soft.     Tenderness: There is no abdominal tenderness. There is no guarding or  rebound.  Musculoskeletal:        General: No tenderness.     Comments: Trace edema to BLE.   Skin:    General: Skin is warm and dry.  Neurological:     Mental Status: She is alert and oriented to person, place, and time.  Psychiatric:        Behavior: Behavior normal.     ED Results / Procedures / Treatments   Labs (all labs ordered are listed, but only abnormal results are displayed) Labs Reviewed  BASIC METABOLIC PANEL - Abnormal; Notable for the following components:      Result Value   CO2 21 (*)    Glucose, Bld 115 (*)    BUN 58 (*)    Creatinine, Ser 3.31 (*)    Calcium 8.8 (*)    GFR, Estimated 14 (*)    All other components within normal limits  CBC - Abnormal; Notable for the following components:   RBC 3.47 (*)    Hemoglobin 10.2 (*)    HCT 33.4 (*)    All other components within normal limits  TROPONIN I (HIGH SENSITIVITY)  TROPONIN I (HIGH SENSITIVITY)    EKG EKG Interpretation  Date/Time:  Saturday May 30 2022 21:40:20 EDT Ventricular Rate:  83 PR Interval:  188 QRS Duration: 157 QT Interval:  451 QTC Calculation: 530 R Axis:   40 Text Interpretation: Sinus rhythm Atrial premature complex Right bundle branch block Confirmed by Quintella Reichert [phone]) on 05/30/2022 11:36:24 PM  Radiology DG Chest 2 View  Result Date: 05/30/2022 CLINICAL DATA:  Chest pain and pressure. Radiation to left shoulder. EXAM: CHEST - 2 VIEW COMPARISON:  PA chest 05/22/2022 FINDINGS: The heart size and mediastinal contours are within normal limits. There are moderate to heavy aortic calcific plaques. Both lungs are clear. The visualized skeletal structures are intact, with spondylosis and bridging enthesopathy thoracic spine. IMPRESSION: No active cardiopulmonary disease. Stable chest with aortic atherosclerosis. Electronically Signed   By: Telford Nab M.D.   On: 05/30/2022 22:05    Procedures Procedures    Medications Ordered in ED Medications  alum & mag  hydroxide-simeth (MAALOX/MYLANTA) 200-200-20 MG/5ML suspension 15 mL (15 mLs Oral Given 05/31/22 0105)    ED Course/ Medical Decision Making/ A&P                           Medical Decision Making Amount and/or Complexity of Data Reviewed Labs: ordered. Radiology: ordered.  Risk OTC drugs.   Patient with history of hypertension, diabetes, CKD here for evaluation of chest pain, feels like indigestion.  She is nontoxic-appearing on evaluation and in no acute distress.  EKG is similar when compared to priors.  Troponins are negative x2.  Pain resolved after Maalox administration.  Current clinical picture is not consistent with ACS, PE, dissection.  Feel patient is stable to discharge home with PCP follow-up as well as return precautions.        Final Clinical Impression(s) / ED Diagnoses Final diagnoses:  Atypical chest pain    Rx / DC Orders ED Discharge Orders     None         Quintella Reichert, MD 05/31/22 [phone]

## 2022-06-15 ENCOUNTER — Other Ambulatory Visit: Payer: Self-pay

## 2022-06-15 ENCOUNTER — Emergency Department (HOSPITAL_BASED_OUTPATIENT_CLINIC_OR_DEPARTMENT_OTHER): Payer: Medicare PPO

## 2022-06-15 ENCOUNTER — Emergency Department (HOSPITAL_BASED_OUTPATIENT_CLINIC_OR_DEPARTMENT_OTHER)
Admission: EM | Admit: 2022-06-15 | Discharge: 2022-06-15 | Disposition: A | Discharge status: Home / Self Care | Payer: Medicare PPO | Attending: Emergency Medicine | Admitting: Emergency Medicine

## 2022-06-15 ENCOUNTER — Encounter (HOSPITAL_BASED_OUTPATIENT_CLINIC_OR_DEPARTMENT_OTHER): Payer: Self-pay | Admitting: Pediatrics

## 2022-06-15 DIAGNOSIS — R4182 Altered mental status, unspecified: Secondary | ICD-10-CM | POA: Insufficient documentation

## 2022-06-15 DIAGNOSIS — R413 Other amnesia: Secondary | ICD-10-CM | POA: Diagnosis not present

## 2022-06-15 DIAGNOSIS — Z79899 Other long term (current) drug therapy: Secondary | ICD-10-CM | POA: Diagnosis not present

## 2022-06-15 DIAGNOSIS — N189 Chronic kidney disease, unspecified: Secondary | ICD-10-CM | POA: Diagnosis not present

## 2022-06-15 DIAGNOSIS — D631 Anemia in chronic kidney disease: Secondary | ICD-10-CM | POA: Diagnosis not present

## 2022-06-15 DIAGNOSIS — F0390 Unspecified dementia without behavioral disturbance: Secondary | ICD-10-CM | POA: Insufficient documentation

## 2022-06-15 DIAGNOSIS — D649 Anemia, unspecified: Secondary | ICD-10-CM

## 2022-06-15 LAB — COMPREHENSIVE METABOLIC PANEL
ALT: 11 U/L (ref 0–44)
AST: 19 U/L (ref 15–41)
Albumin: 3.5 g/dL (ref 3.5–5.0)
Alkaline Phosphatase: 61 U/L (ref 38–126)
Anion gap: 4 — ABNORMAL LOW (ref 5–15)
BUN: 56 mg/dL — ABNORMAL HIGH (ref 8–23)
CO2: 22 mmol/L (ref 22–32)
Calcium: 8.5 mg/dL — ABNORMAL LOW (ref 8.9–10.3)
Chloride: 115 mmol/L — ABNORMAL HIGH (ref 98–111)
Creatinine, Ser: 2.48 mg/dL — ABNORMAL HIGH (ref 0.44–1.00)
GFR, Estimated: 20 mL/min — ABNORMAL LOW (ref >60)
Glucose, Bld: 140 mg/dL — ABNORMAL HIGH (ref 70–99)
Potassium: 5 mmol/L (ref 3.5–5.1)
Sodium: 141 mmol/L (ref 135–145)
Total Bilirubin: 0.3 mg/dL (ref 0.3–1.2)
Total Protein: 7 g/dL (ref 6.5–8.1)

## 2022-06-15 LAB — URINALYSIS, ROUTINE W REFLEX MICROSCOPIC
Bilirubin Urine: NEGATIVE
Glucose, UA: NEGATIVE mg/dL
Ketones, ur: NEGATIVE mg/dL
Leukocytes,Ua: NEGATIVE
Nitrite: NEGATIVE
Protein, ur: 100 mg/dL — AB
Specific Gravity, Urine: 1.015 (ref 1.005–1.030)
pH: 5.5 (ref 5.0–8.0)

## 2022-06-15 LAB — CBC
HCT: 29.2 % — ABNORMAL LOW (ref 36.0–46.0)
Hemoglobin: 9.4 g/dL — ABNORMAL LOW (ref 12.0–15.0)
MCH: 29 pg (ref 26.0–34.0)
MCHC: 32.2 g/dL (ref 30.0–36.0)
MCV: 90.1 fL (ref 80.0–100.0)
Platelets: 256 10*3/uL (ref 150–400)
RBC: 3.24 MIL/uL — ABNORMAL LOW (ref 3.87–5.11)
RDW: 13.8 % (ref 11.5–15.5)
WBC: 6.4 10*3/uL (ref 4.0–10.5)
nRBC: 0 % (ref 0.0–0.2)

## 2022-06-15 LAB — URINALYSIS, MICROSCOPIC (REFLEX): WBC, UA: NONE SEEN WBC/hpf (ref 0–5)

## 2022-06-15 LAB — ETHANOL: Alcohol, Ethyl (B): <10 mg/dL (ref <10)

## 2022-06-15 NOTE — ED Notes (Signed)
Pt transported to CT ?

## 2022-06-15 NOTE — ED Provider Notes (Signed)
[facility] EMERGENCY DEPARTMENT Provider Note   CSN: [id number] Arrival date & time: 06/15/22  1849     History  Chief Complaint  Patient presents with   Altered Mental Status    Teresa Poole is a 71 y.o. female.   Altered Mental Status    Pt states she is having memory loss today.  SHe is having trouble remembering what she did today and yesterday.  Pt could not remember the name of her doctor today.  Pt cannot recall what she ate today.  SHe had trouble remembering how to get to the doctors office today and she had to use her cell phone. Pt did see her doctor yesterday and she recalls being given a pamphlet about alzheimer.  She does not recall if she was having memory issues before. Pt lives by herself.  Home Medications Prior to Admission medications   Medication Sig Start Date End Date Taking? Authorizing Provider  albuterol (VENTOLIN HFA) 108 (90 Base) MCG/ACT inhaler Inhale 2 puffs into the lungs every 4 (four) hours as needed for wheezing or shortness of breath. Patient not taking: Reported on 02/28/2022 10/23/21   [provider]  carvedilol (COREG) 25 MG tablet Take 25 mg by mouth 2 (two) times daily with a meal.    [provider]  cetirizine (ZYRTEC) 10 MG tablet Take 10 mg by mouth daily as needed for allergies.    [provider]  cycloSPORINE, PF, (CEQUA) 0.09 % SOLN Place 1 drop into both eyes 2 (two) times daily. 12/18/20   [provider]  famotidine (PEPCID) 40 MG tablet Take 40 mg by mouth daily. 09/04/21   [provider]  fluticasone (FLONASE) 50 MCG/ACT nasal spray Place 2 sprays into both nostrils daily.    [provider]  furosemide (LASIX) 40 MG tablet Take 1 tablet (40 mg total) by mouth daily after breakfast. 04/21/92   Delora Fuel, MD  glucose blood (ONE TOUCH ULTRA TEST) test strip Use to check blood sugar 3 times daily Dx code E11.65 Patient taking differently: 1 each by Other route  See admin instructions. Use to check blood sugar 3 times daily Dx code E11.65 11/20/14   Elayne Snare, MD  meclizine (ANTIVERT) 25 MG tablet Take 25 mg by mouth 3 (three) times daily as needed for dizziness.    [provider]  omeprazole (PRILOSEC) 40 MG capsule Take 40 mg by mouth daily. 03/10/22   [provider]  ONETOUCH VERIO test strip [place] Patient taking differently: 1 each by Other route in the morning, at noon, and at bedtime. 03/30/18   Elayne Snare, MD  OZEMPIC, 0.25 OR 0.5 MG/DOSE, 2 MG/1.5ML SOPN Inject 0.25 mg into the skin once a week. Sundays 05/12/21   [provider]  polyethylene glycol (MIRALAX / GLYCOLAX) 17 g packet Take 17 g by mouth daily. Patient not taking: Reported on 10/25/2021 09/28/21   Eugenie Filler, MD  rosuvastatin (CRESTOR) 20 MG tablet Take 20 mg by mouth daily.    [provider]  sucralfate (CARAFATE) 1 g tablet Take 1 tablet (1 g total) by mouth 4 (four) times daily -  with meals and at bedtime. 05/23/22   Montine Circle, PA-C      Allergies    Hydralazine hcl and Atorvastatin    Review of Systems   Review of Systems  Physical Exam Updated Vital Signs BP (!) 168/78 (BP Location: Left Arm)   Pulse 81  Temp 98 F (36.7 C)   Resp 18   Ht 1.702 m ('5\' 7"'$ )   Wt 70.3 kg   SpO2 98%   BMI 24.28 kg/m  Physical Exam Vitals and nursing note reviewed.  Constitutional:      General: She is not in acute distress.    Appearance: She is well-developed.  HENT:     Head: Normocephalic and atraumatic.     Right Ear: External ear normal.     Left Ear: External ear normal.  Eyes:     General: No visual field deficit or scleral icterus.       Right eye: No discharge.        Left eye: No discharge.     Conjunctiva/sclera: Conjunctivae normal.  Neck:     Trachea: No tracheal deviation.  Cardiovascular:     Rate and Rhythm: Normal rate and regular rhythm.  Pulmonary:     Effort: Pulmonary  effort is normal. No respiratory distress.     Breath sounds: Normal breath sounds. No stridor. No wheezing or rales.  Abdominal:     General: Bowel sounds are normal. There is no distension.     Palpations: Abdomen is soft.     Tenderness: There is no abdominal tenderness. There is no guarding or rebound.  Musculoskeletal:        General: No tenderness or deformity.     Cervical back: Neck supple.  Skin:    General: Skin is warm and dry.     Findings: No rash.  Neurological:     General: No focal deficit present.     Mental Status: She is alert.     Cranial Nerves: No cranial nerve deficit (no facial droop, extraocular movements intact, no slurred speech), dysarthria or facial asymmetry.     Sensory: No sensory deficit.     Motor: No abnormal muscle tone, seizure activity or pronator drift.     Coordination: Coordination normal.     Comments:  able to hold both legs off bed for 5 seconds, sensation intact in all extremities,  no left or right sided neglect, normal finger-nose exam bilaterally, no nystagmus noted  Knows the year, could not remember the month   Psychiatric:        Mood and Affect: Mood normal.     ED Results / Procedures / Treatments   Labs (all labs ordered are listed, but only abnormal results are displayed) Labs Reviewed  COMPREHENSIVE METABOLIC PANEL - Abnormal; Notable for the following components:      Result Value   Chloride 115 (*)    Glucose, Bld 140 (*)    BUN 56 (*)    Creatinine, Ser 2.48 (*)    Calcium 8.5 (*)    GFR, Estimated 20 (*)    Anion gap 4 (*)    All other components within normal limits  CBC - Abnormal; Notable for the following components:   RBC 3.24 (*)    Hemoglobin 9.4 (*)    HCT 29.2 (*)    All other components within normal limits  CBG MONITORING, ED    EKG None  Radiology No results found.  Procedures Procedures    Medications Ordered in ED Medications - No data to display  ED Course/ Medical Decision  Making/ A&P Clinical Course as of 06/15/22 2304  Mon Jun 15, 2022  2132 CT Head Wo Contrast CT without acute abnormality [JK]  2132 Comprehensive metabolic panel(!) Creatinine elevated but decreased compared to previous values [JK]  2133 CBC(!) Anemia noted.  Hemoglobin similar to previous values [JK]  2223 Urinalysis, Routine w reflex microscopic Urine, Clean Catch(!) Urinalysis not suggestive of infection. [JK]    Clinical Course User Index [JK] Dorie Rank, MD                           Medical Decision Making Problems Addressed: Anemia, unspecified type: chronic illness or injury Chronic kidney disease, unspecified CKD stage: chronic illness or injury Dementia, unspecified dementia severity, unspecified dementia type, unspecified whether behavioral, psychotic, or mood disturbance or anxiety ([place]): chronic illness or injury with exacerbation, progression, or side effects of treatment  Amount and/or Complexity of Data Reviewed External Data Reviewed: notes.    Details: Reviewed notes from the neurology visit on October 20.  Patient has been diagnosed with Alzheimer's.  Driving not recommended per the notes Labs: ordered. Decision-making details documented in ED Course. Radiology: ordered. Decision-making details documented in ED Course.   Patient presented to the ED for evaluation of memory issues.  I was able to review outpatient records and patient was just recently evaluated by a neuropsychologist.  Patient has been diagnosed with Alzheimer's.  No signs of any acute infection or electrolyte abnormality today.  Head CT does not show any acute findings.  I have low suspicion for stroke.  Patient remained alert and awake in the ED.  He is answering questions normally.  I do think her findings are related to her neurocognitive issues.  Reviewed the notes from the neuropsychologist visit with the patient and her family member.  Recommend outpatient follow-up with PCP.  We did discuss  possible additional help at home.  Discussed the dangers of her driving with her memory impairment.  Evaluation and diagnostic testing in the emergency department does not suggest an emergent condition requiring admission or immediate intervention beyond what has been performed at this time.  The patient is safe for discharge and has been instructed to return immediately for worsening symptoms, change in symptoms or any other concerns.        Final Clinical Impression(s) / ED Diagnoses Final diagnoses:  None    Rx / DC Orders ED Discharge Orders     None         Dorie Rank, MD 06/15/22 2311

## 2022-06-15 NOTE — Discharge Instructions (Signed)
Follow-up with your primary care doctor and neurologist for further evaluation as planned

## 2022-06-15 NOTE — ED Triage Notes (Signed)
Reported she has some memory problem the last week; more pronounced today where she didn't know the directions to go to the doctor she's been seeing for years; stated she was not feeling well and was at a healthcare in [facility] and was given booklet for Alzheimer's. No recollection of being diagnose with this.

## 2022-06-30 IMAGING — DX DG CHEST 2V
2 series · 2 of 2 positions shown · non-contrast
Comparison: Chest radiographs 08/28/2021 and earlier.

CLINICAL DATA: 70-year-old female with central chest pressure, pain
radiating to the right shoulder and arm.

EXAM:
CHEST - 2 VIEW

[chest pa]
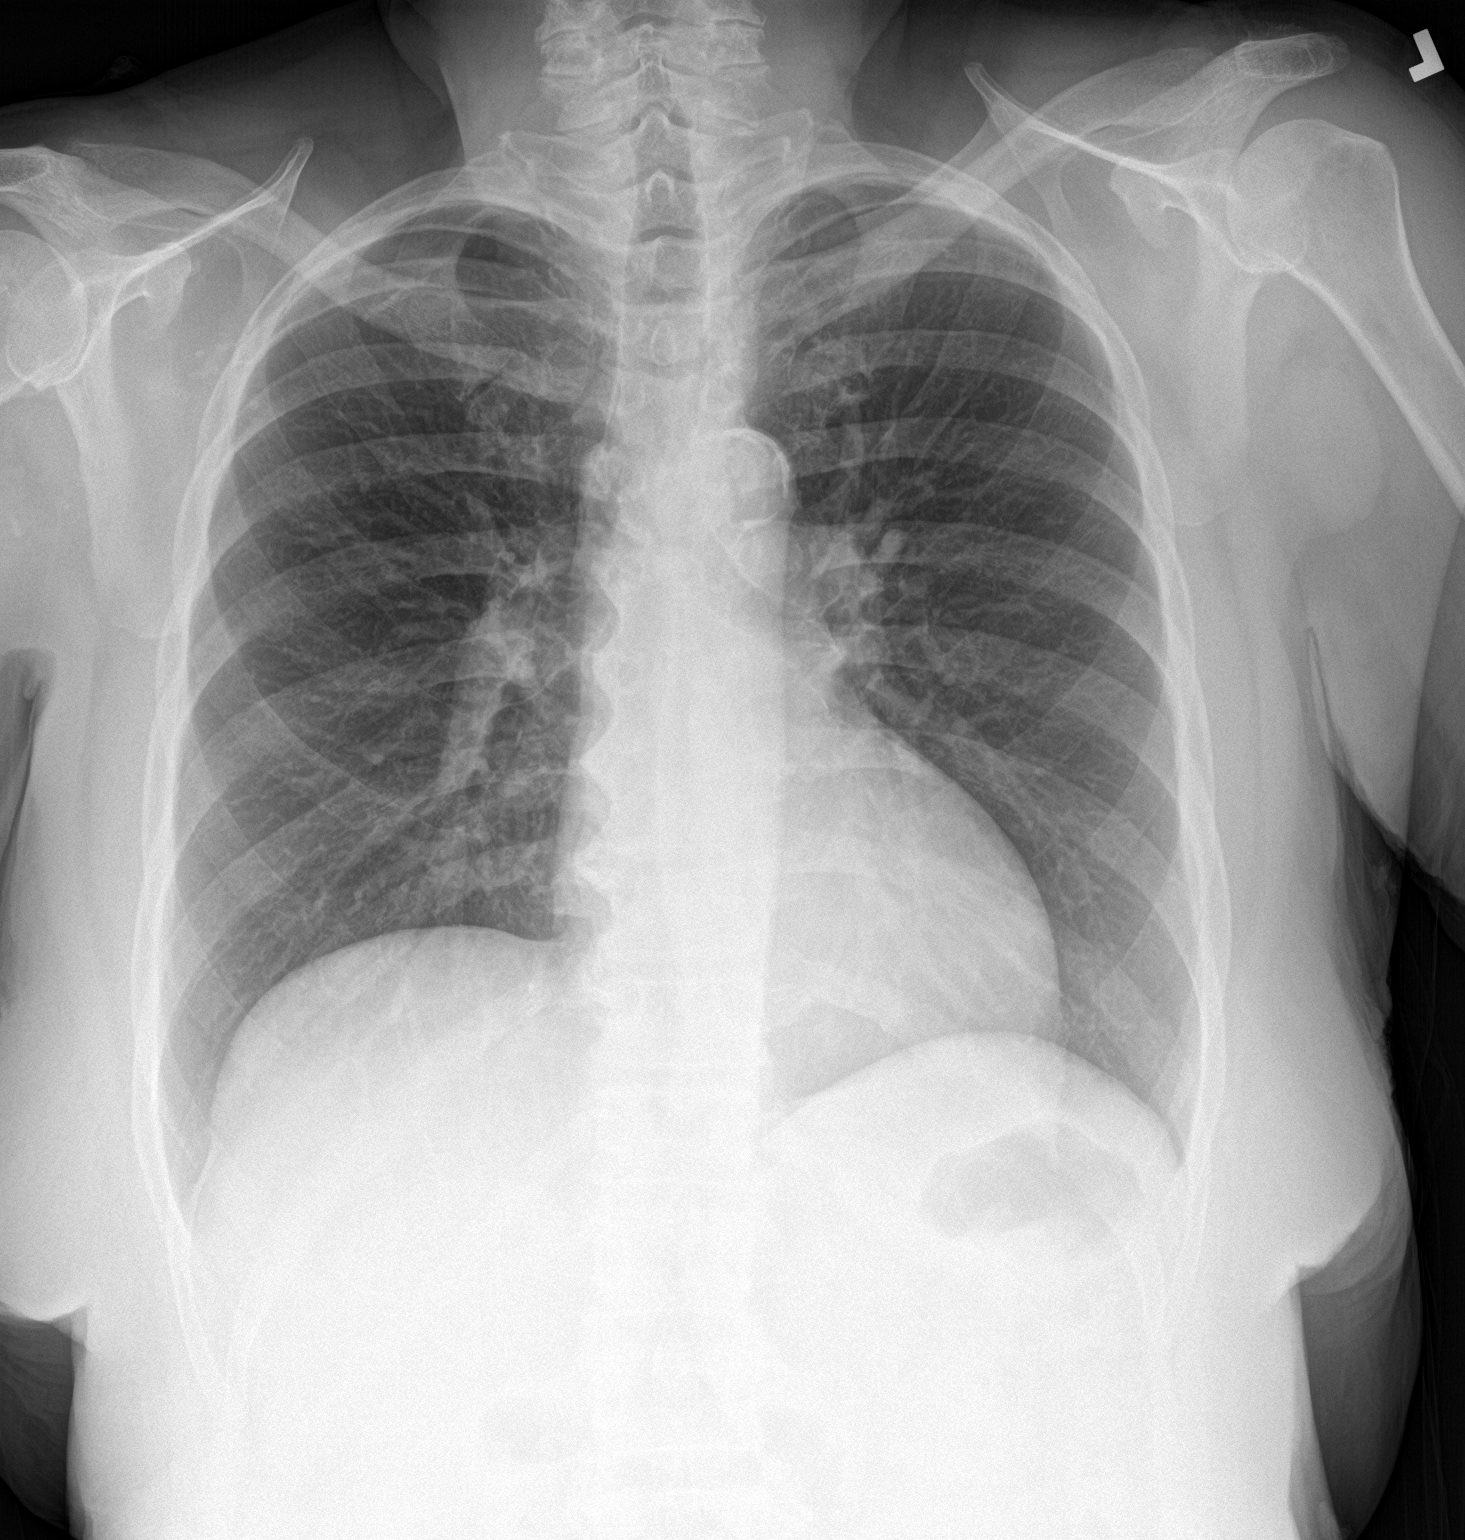

[chest lat]
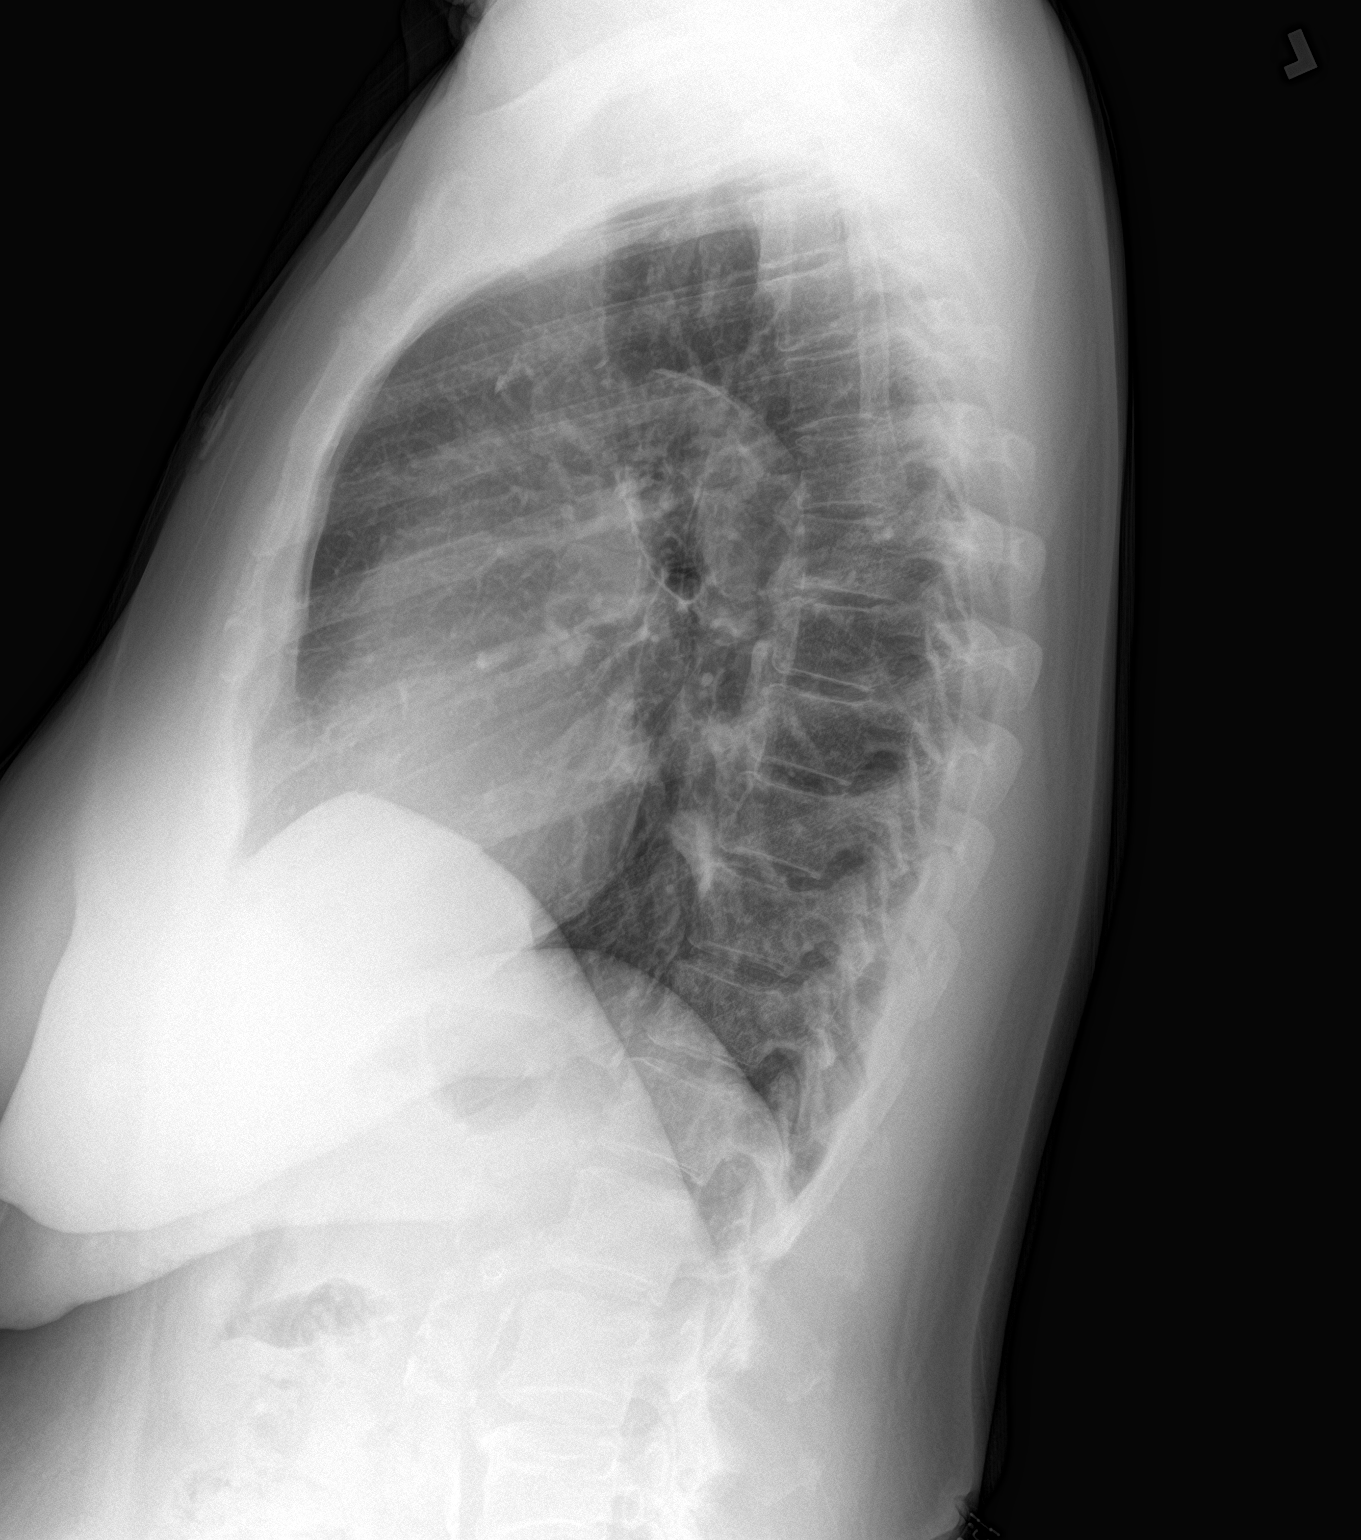

[2 of 2 positions shown; findings below may reference images not displayed]

FINDINGS: Lung volumes and mediastinal contours remain normal. Calcified
aortic atherosclerosis. Visualized tracheal air column is within
normal limits. Calcified right subclavian/axillary atherosclerosis.
Both lungs appear stable and clear. No pneumothorax or pleural
effusion. No acute osseous abnormality identified. Negative visible
bowel gas.
IMPRESSION: No acute cardiopulmonary abnormality.

Aortic Atherosclerosis ([id number]).

## 2022-07-01 ENCOUNTER — Emergency Department (HOSPITAL_COMMUNITY)
Admission: EM | Admit: 2022-07-01 | Discharge: 2022-07-02 | Disposition: A | Discharge status: Home / Self Care | Payer: Medicare PPO | Attending: Emergency Medicine | Admitting: Emergency Medicine

## 2022-07-01 ENCOUNTER — Emergency Department (HOSPITAL_COMMUNITY): Payer: Medicare PPO

## 2022-07-01 ENCOUNTER — Other Ambulatory Visit: Payer: Self-pay

## 2022-07-01 DIAGNOSIS — N184 Chronic kidney disease, stage 4 (severe): Secondary | ICD-10-CM | POA: Insufficient documentation

## 2022-07-01 DIAGNOSIS — E1122 Type 2 diabetes mellitus with diabetic chronic kidney disease: Secondary | ICD-10-CM | POA: Diagnosis not present

## 2022-07-01 DIAGNOSIS — I129 Hypertensive chronic kidney disease with stage 1 through stage 4 chronic kidney disease, or unspecified chronic kidney disease: Secondary | ICD-10-CM | POA: Diagnosis not present

## 2022-07-01 DIAGNOSIS — M542 Cervicalgia: Secondary | ICD-10-CM | POA: Diagnosis present

## 2022-07-01 DIAGNOSIS — H93A9 Pulsatile tinnitus, unspecified ear: Secondary | ICD-10-CM | POA: Insufficient documentation

## 2022-07-01 DIAGNOSIS — Z79899 Other long term (current) drug therapy: Secondary | ICD-10-CM | POA: Insufficient documentation

## 2022-07-01 LAB — URINALYSIS, ROUTINE W REFLEX MICROSCOPIC
Bacteria, UA: NONE SEEN
Bilirubin Urine: NEGATIVE
Glucose, UA: NEGATIVE mg/dL
Hgb urine dipstick: NEGATIVE
Ketones, ur: NEGATIVE mg/dL
Leukocytes,Ua: NEGATIVE
Nitrite: NEGATIVE
Protein, ur: 100 mg/dL — AB
Specific Gravity, Urine: 1.014 (ref 1.005–1.030)
pH: 5 (ref 5.0–8.0)

## 2022-07-01 LAB — CBC WITH DIFFERENTIAL/PLATELET
Abs Immature Granulocytes: 0.02 10*3/uL (ref 0.00–0.07)
Basophils Absolute: 0 10*3/uL (ref 0.0–0.1)
Basophils Relative: 1 %
Eosinophils Absolute: 0.1 10*3/uL (ref 0.0–0.5)
Eosinophils Relative: 2 %
HCT: 29.2 % — ABNORMAL LOW (ref 36.0–46.0)
Hemoglobin: 9.3 g/dL — ABNORMAL LOW (ref 12.0–15.0)
Immature Granulocytes: 0 %
Lymphocytes Relative: 29 %
Lymphs Abs: 1.8 10*3/uL (ref 0.7–4.0)
MCH: 28.8 pg (ref 26.0–34.0)
MCHC: 31.8 g/dL (ref 30.0–36.0)
MCV: 90.4 fL (ref 80.0–100.0)
Monocytes Absolute: 0.5 10*3/uL (ref 0.1–1.0)
Monocytes Relative: 8 %
Neutro Abs: 3.8 10*3/uL (ref 1.7–7.7)
Neutrophils Relative %: 60 %
Platelets: 278 10*3/uL (ref 150–400)
RBC: 3.23 MIL/uL — ABNORMAL LOW (ref 3.87–5.11)
RDW: 13.4 % (ref 11.5–15.5)
WBC: 6.3 10*3/uL (ref 4.0–10.5)
nRBC: 0 % (ref 0.0–0.2)

## 2022-07-01 LAB — COMPREHENSIVE METABOLIC PANEL
ALT: 18 U/L (ref 0–44)
AST: 29 U/L (ref 15–41)
Albumin: 3.5 g/dL (ref 3.5–5.0)
Alkaline Phosphatase: 56 U/L (ref 38–126)
Anion gap: 10 (ref 5–15)
BUN: 52 mg/dL — ABNORMAL HIGH (ref 8–23)
CO2: 21 mmol/L — ABNORMAL LOW (ref 22–32)
Calcium: 8.9 mg/dL (ref 8.9–10.3)
Chloride: 108 mmol/L (ref 98–111)
Creatinine, Ser: 3.6 mg/dL — ABNORMAL HIGH (ref 0.44–1.00)
GFR, Estimated: 13 mL/min — ABNORMAL LOW (ref >60)
Glucose, Bld: 128 mg/dL — ABNORMAL HIGH (ref 70–99)
Potassium: 4.8 mmol/L (ref 3.5–5.1)
Sodium: 139 mmol/L (ref 135–145)
Total Bilirubin: 0.6 mg/dL (ref 0.3–1.2)
Total Protein: 7.5 g/dL (ref 6.5–8.1)

## 2022-07-01 LAB — TROPONIN I (HIGH SENSITIVITY)
Troponin I (High Sensitivity): 15 ng/L (ref <18)
Troponin I (High Sensitivity): 16 ng/L (ref <18)

## 2022-07-01 MED ORDER — CLOPIDOGREL BISULFATE 75 MG PO TABS
75.0000 mg | ORAL_TABLET | Freq: Once | ORAL | Status: AC
  Administered 2022-07-01: 75 mg via ORAL
  Filled 2022-07-01: qty 1

## 2022-07-01 MED ORDER — GADOBUTROL 1 MMOL/ML IV SOLN
7.0000 mL | Freq: Once | INTRAVENOUS | Status: AC | PRN
  Administered 2022-07-01: 7 mL via INTRAVENOUS

## 2022-07-01 MED ORDER — CARVEDILOL 12.5 MG PO TABS
25.0000 mg | ORAL_TABLET | Freq: Once | ORAL | Status: AC
  Administered 2022-07-01: 25 mg via ORAL
  Filled 2022-07-01: qty 2

## 2022-07-01 MED ORDER — ASPIRIN 81 MG PO CHEW
81.0000 mg | CHEWABLE_TABLET | Freq: Once | ORAL | Status: AC
  Administered 2022-07-01: 81 mg via ORAL
  Filled 2022-07-01: qty 1

## 2022-07-01 MED ORDER — METOPROLOL TARTRATE 5 MG/5ML IV SOLN
5.0000 mg | Freq: Once | INTRAVENOUS | Status: DC
  Filled 2022-07-01: qty 5

## 2022-07-01 NOTE — Discharge Instructions (Signed)
Today you were recommended for imaging of your neck to rule out a dissection study for your neck pain and hearing changes.  You are requesting to leave prior to imaging studies.  My concern is that we are missing a dissection in your neck that can result in life-threatening injury.  You are stating that you want to leave to go home to be with your family before they leave to drive back to [place].  You are alert, oriented, and are able to verbalize understanding of the potential life-threatening process occurring at this time.  If you change your mind for any reason please return to the emergency department we would be more than happy to see you again and get these imaging studies promptly.

## 2022-07-01 NOTE — ED Triage Notes (Signed)
Left ear fullness/pressure radiating down into neck and left side of chest 25 mins PTA while sitting in car.  Pt also c/o swooshing sound to left ear.  PA in room and aware

## 2022-07-01 NOTE — ED Provider Notes (Signed)
[place] DEPT Provider Note   CSN: [id number] Arrival date & time: 07/01/22  1621     History  Chief Complaint  Patient presents with   pressure in neck    Teresa Poole is a 71 y.o. female.  Patient is a 71 yo female with pmh of HTN, CKD stage IV, hyperlipidemia, DM2, and nonischemic cardiomyopathy presenting for neck fullness. Pt states "I feel like someone stuck a pole into my neck on the left side". She admits to pressure that started while sitting in the park about 3 hours prior to arrival. States she can hear a "whooshing" sound in her left ear. Denies any hx of falls or neck trauma. Denies any fevers, chills, coughing, or recent illness.  Pt denies hx of CVA.  Denies slurred speech or difficulty speaking. States both legs "feel like they have a pair of tights on them". These sensation changes started while in the waiting room.   The history is provided by the patient. No language interpreter was used.       Home Medications Prior to Admission medications   Medication Sig Start Date End Date Taking? Authorizing Provider  albuterol (VENTOLIN HFA) 108 (90 Base) MCG/ACT inhaler Inhale 2 puffs into the lungs every 4 (four) hours as needed for wheezing or shortness of breath. Patient not taking: Reported on 02/28/2022 10/23/21   [provider]  carvedilol (COREG) 25 MG tablet Take 25 mg by mouth 2 (two) times daily with a meal.    [provider]  cetirizine (ZYRTEC) 10 MG tablet Take 10 mg by mouth daily as needed for allergies.    [provider]  cycloSPORINE, PF, (CEQUA) 0.09 % SOLN Place 1 drop into both eyes 2 (two) times daily. 12/18/20   [provider]  famotidine (PEPCID) 40 MG tablet Take 40 mg by mouth daily. 09/04/21   [provider]  fluticasone (FLONASE) 50 MCG/ACT nasal spray Place 2 sprays into both nostrils daily.    [provider]  furosemide (LASIX) 40 MG tablet Take 1  tablet (40 mg total) by mouth daily after breakfast. 0/25/85   Delora Fuel, MD  glucose blood (ONE TOUCH ULTRA TEST) test strip Use to check blood sugar 3 times daily Dx code E11.65 Patient taking differently: 1 each by Other route See admin instructions. Use to check blood sugar 3 times daily Dx code E11.65 11/20/14   Elayne Snare, MD  meclizine (ANTIVERT) 25 MG tablet Take 25 mg by mouth 3 (three) times daily as needed for dizziness.    [provider]  omeprazole (PRILOSEC) 40 MG capsule Take 40 mg by mouth daily. 03/10/22   [provider]  ONETOUCH VERIO test strip [place] Patient taking differently: 1 each by Other route in the morning, at noon, and at bedtime. 03/30/18   Elayne Snare, MD  OZEMPIC, 0.25 OR 0.5 MG/DOSE, 2 MG/1.5ML SOPN Inject 0.25 mg into the skin once a week. Sundays 05/12/21   [provider]  polyethylene glycol (MIRALAX / GLYCOLAX) 17 g packet Take 17 g by mouth daily. Patient not taking: Reported on 10/25/2021 09/28/21   Eugenie Filler, MD  rosuvastatin (CRESTOR) 20 MG tablet Take 20 mg by mouth daily.    [provider]  sucralfate (CARAFATE) 1 g tablet Take 1 tablet (1 g total) by mouth 4 (four) times daily -  with meals and at bedtime. 05/23/22   Montine Circle, PA-C  Allergies    Hydralazine hcl and Atorvastatin    Review of Systems   Review of Systems  Constitutional:  Negative for chills and fever.  HENT:  Negative for ear pain and sore throat.   Eyes:  Negative for pain and visual disturbance.  Respiratory:  Negative for cough and shortness of breath.   Cardiovascular:  Negative for chest pain and palpitations.  Gastrointestinal:  Negative for abdominal pain and vomiting.  Genitourinary:  Negative for dysuria and hematuria.  Musculoskeletal:  Positive for neck pain. Negative for arthralgias and back pain.  Skin:  Negative for color change and rash.  Neurological:  Negative for seizures,  syncope, weakness and numbness.  All other systems reviewed and are negative.   Physical Exam Updated Vital Signs BP 139/89   Pulse 88   Temp 98.2 F (36.8 C) (Oral)   Resp 16   SpO2 97%  Physical Exam Vitals and nursing note reviewed.  Constitutional:      General: She is not in acute distress.    Appearance: She is well-developed.  HENT:     Head: Normocephalic and atraumatic.  Eyes:     Conjunctiva/sclera: Conjunctivae normal.  Cardiovascular:     Rate and Rhythm: Normal rate and regular rhythm.     Heart sounds: No murmur heard. Pulmonary:     Effort: Pulmonary effort is normal. No respiratory distress.     Breath sounds: Normal breath sounds.  Abdominal:     Palpations: Abdomen is soft.     Tenderness: There is no abdominal tenderness.  Musculoskeletal:        General: No swelling.     Cervical back: Neck supple.  Skin:    General: Skin is warm and dry.     Capillary Refill: Capillary refill takes less than 2 seconds.  Neurological:     Mental Status: She is alert.  Psychiatric:        Mood and Affect: Mood normal.     ED Results / Procedures / Treatments   Labs (all labs ordered are listed, but only abnormal results are displayed) Labs Reviewed  CBC WITH DIFFERENTIAL/PLATELET - Abnormal; Notable for the following components:      Result Value   RBC 3.23 (*)    Hemoglobin 9.3 (*)    HCT 29.2 (*)    All other components within normal limits  COMPREHENSIVE METABOLIC PANEL - Abnormal; Notable for the following components:   CO2 21 (*)    Glucose, Bld 128 (*)    BUN 52 (*)    Creatinine, Ser 3.60 (*)    GFR, Estimated 13 (*)    All other components within normal limits  URINALYSIS, ROUTINE W REFLEX MICROSCOPIC - Abnormal; Notable for the following components:   Protein, ur 100 (*)    All other components within normal limits  TROPONIN I (HIGH SENSITIVITY)  TROPONIN I (HIGH SENSITIVITY)    EKG EKG Interpretation  Date/Time:  Wednesday July 01 2022 16:39:45 EST Ventricular Rate:  84 PR Interval:  186 QRS Duration: 153 QT Interval:  423 QTC Calculation: 501 R Axis:   0 Text Interpretation: Sinus rhythm Right bundle branch block When compared with ECG of 05/30/2022, No significant change was found Confirmed by Delora Fuel ([id number]) on 07/01/2022 11:38:33 PM  Radiology No results found.  Procedures Procedures    Medications Ordered in ED Medications  aspirin chewable tablet 81 mg (81 mg Oral Given 07/01/22 1716)  clopidogrel (PLAVIX) tablet 75 mg (75 mg Oral  Given 07/01/22 1835)  carvedilol (COREG) tablet 25 mg (25 mg Oral Given 07/01/22 1842)  gadobutrol (GADAVIST) 1 MMOL/ML injection 7 mL (7 mLs Intravenous Contrast Given 07/01/22 2126)    ED Course/ Medical Decision Making/ A&P                           Medical Decision Making Risk Prescription drug management.   4:45 AM  71 yo female with pmh of HTN, CKD stage IV, hyperlipidemia, DM2, and nonischemic cardiomyopathy presenting for neck fullness.  Patient is alert and oriented x3, no acute distress, afebrile, stable vital signs.  Tenderness to palpation of the left anterior neck.  Triage team already spoke with neurology who recommended MRI imaging.  Imaging ordered and pending.  Patient is neurovascular intact on exam.    Stable EKG with no ST segment elevation or depression.    MR angio neck with and without contrast demonstrates: 1. Approximately 50% stenosis in the proximal left ICA. 2. 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 This could be further evaluated with a CTA if clinically indicated. 3. No other significant stenosis in the extracranial vertebral arteries. The origins of the vertebral arteries are not well evaluated due to artifact.  Vascular surgery recommends no acute interventions.  Patient made aware of findings.  Pain stable at this time after blood pressure  control with home medications.  Patient in no distress and overall condition improved here in the ED. Detailed discussions were had with the patient regarding current findings, and need for close f/u with PCP or on call doctor. The patient has been instructed to return immediately if the symptoms worsen in any way for re-evaluation. Patient verbalized understanding and is in agreement with current care plan. All questions answered prior to discharge.         Final Clinical Impression(s) / ED Diagnoses Final diagnoses:  Neck pain    Rx / DC Orders ED Discharge Orders     None         Lianne Cure, DO 75/10/25 0445

## 2022-07-01 NOTE — ED Provider Triage Note (Cosign Needed Addendum)
Emergency Medicine Provider Triage Evaluation Note  Teresa Poole , a 71 y.o. female  was evaluated in triage.  Pt complains of  neck fullness for the past 20 minutes. She reports that she was sitting at a park when she started to feel wooshing in her left ear and feel fullness in her neck and chest. Denies any weakness to her left arm.  Review of Systems  Positive:  Negative:   Physical Exam  BP (!) 155/75   Pulse 87   Temp 98 F (36.7 C)   Resp 16   SpO2 100%  Gen:   Awake, no distress   Resp:  Normal effort  MSK:   Moves extremities without difficulty  Other:  Flow murmur auscultated going into the left carotid.   Medical Decision Making  Medically screening exam initiated at 4:46 PM.  Appropriate orders placed.  Teresa Poole was informed that the remainder of the evaluation will be completed by another provider, this initial triage assessment does not replace that evaluation, and the importance of remaining in the ED until their evaluation is complete.  Concern for dissection. Patient had lab work around 2 weeks ago that showed a Cr of 2.5. Had a shared discussion with patient about the emergent need of a CTA of the neck to r/o any dissection. Discussed that given her poor kidney function, it may lead her to needing dialysis. Patient reports "I would rather die than be on dialysis". Confirmed with her that it is a possibility, not a definite consequence. Patient would still not like to do anything that would even increase the potential of needing dialysis. Given that it is close to 5, I called Vascular US twice with no answer. Consult placed to vascular for potential carotid US.  ADDEND: Spoke with Hawken with vascular surgery.  He recommends an MRA of the neck as he would not trust a ultrasound carotid at bedside to definitively rule out a dissection.  He would like me to call radiology to confirm. Would like me to start the patient on DAPT. Plavix and ASA ordered.  I called  to confirm MRA of the neck and spoke with Dr. Carlis Abbott with [facility] radiology.  He recommended ordering an MRI of the brain as well as rule out any stroke.   Teresa Puller, PA-C 07/01/22 1656    Teresa Puller, PA-C 07/01/22 1711    Teresa Puller, PA-C 07/01/22 1714

## 2022-07-01 NOTE — ED Notes (Signed)
Pt requesting to take home medications. Dr. Pearline Cables notified. Advised to give home Carvedilol and monitor BP for need for IV medication

## 2022-07-01 NOTE — Consult Note (Signed)
[facility] [place]  ASSESSMENT / PLAN: 71 y.o. female with left neck pain, left chest discomfort, and abnormal sensation in left ear. The ER staff is concerned for carotid dissection, which is certainly on the differential. I recommend a MR angiogram of the neck. Recommend dual antiplatelet therapy with aspirin and plavix.  CHIEF COMPLAINT: left neck discomfort  HISTORY OF PRESENT ILLNESS: Teresa Poole is a 71 y.o. female who presents to [facility] emergency department for evaluation of left neck discomfort radiating into the left chest and into the left ear.  The patient described pulsatile tinnitus to the ER staff.  On my examination, the patient reports the discomfort feels like a "pencil in her neck" and that she has a "cotton ball" in her left ear.  The symptoms occurred several hours prior to presentation while she was driving.  She stopped driving and sat for 2 hours before she felt well enough to drive to the ER.  She reports no history of similar symptoms in the past.   Past Medical History:  Diagnosis Date   Allergy    Anemia    Carotid stenosis    Chronic combined systolic and diastolic CHF (congestive heart failure) (HCC)    CKD (chronic kidney disease), stage IV ([place]) 09/24/2013   Pt at [place], Dr. Joelyn Oms   COVID-19 10/24/2021   Diabetes mellitus type 2 in nonobese [facility])    Edema 09/14/2013   GERD (gastroesophageal reflux disease)    History of ovarian cancer    Hyperlipidemia    Hypertension    Mild CAD    non-obstructive by LHC (09/25/2013): Proximal and mid LAD serial 20%, proximal circumflex 30%, mid AV groove circumflex 30%, mid RCA mild plaque.   NICM (nonischemic cardiomyopathy) (HCC)    Obesity (BMI 30-39.9)    RBBB    Renal artery stenosis ([place]) 10/11/2018   Thyroid disease    Seen by specialist    Past Surgical History:  Procedure Laterality Date   LEFT HEART CATHETERIZATION WITH CORONARY ANGIOGRAM N/A 09/25/2013    Procedure: LEFT HEART CATHETERIZATION WITH CORONARY ANGIOGRAM;  Surgeon: Burnell Blanks, MD;  Location: [facility] CATH LAB;  Service: Cardiovascular;  Laterality: N/A;   RENAL ANGIOGRAPHY N/A 10/06/2018   Procedure: RENAL ANGIOGRAPHY;  Surgeon: Marty Heck, MD;  Location: [facility] CV LAB;  Service: Cardiovascular;  Laterality: N/A;    Family History  Problem Relation Age of Onset   Diabetes Father    Hypertension Father    Hypertension Maternal Grandfather     Social History   Socioeconomic History   Marital status: Single    Spouse name: Not on file   Number of children: 0   Years of education: Not on file   Highest education level: Not on file  Occupational History    Employer: [employer]  Tobacco Use   Smoking status: Never   Smokeless tobacco: Never  Vaping Use   Vaping Use: Never used  Substance and Sexual Activity   Alcohol use: No   Drug use: No   Sexual activity: Not on file  Other Topics Concern   Not on file  Social History Narrative   Works at [employer]   Patient lives at home alone.    Patient has no children.    Patient patient is right handed.    Patient is single.    Social Determinants of Health   Financial Resource Strain: Not on file  Food Insecurity:  Not on file  Transportation Needs: Not on file  Physical Activity: Not on file  Stress: Not on file  Social Connections: Not on file  Intimate Partner Violence: Not on file    Allergies  Allergen Reactions   Hydralazine Hcl Itching    ENTIRE BODY = burning sensation, also in the eyes (they have become very sensitive to light)   Atorvastatin Other (See Comments)    Per MD - pt not sure of reaction     No current facility-administered medications for this encounter.   Current Outpatient Medications  Medication Sig Dispense Refill   albuterol (VENTOLIN HFA) 108 (90 Base) MCG/ACT inhaler Inhale 2 puffs into the lungs every 4 (four) hours as needed for wheezing or shortness of  breath. (Patient not taking: Reported on 02/28/2022)     carvedilol (COREG) 25 MG tablet Take 25 mg by mouth 2 (two) times daily with a meal.     cetirizine (ZYRTEC) 10 MG tablet Take 10 mg by mouth daily as needed for allergies.     cycloSPORINE, PF, (CEQUA) 0.09 % SOLN Place 1 drop into both eyes 2 (two) times daily.     famotidine (PEPCID) 40 MG tablet Take 40 mg by mouth daily.     fluticasone (FLONASE) 50 MCG/ACT nasal spray Place 2 sprays into both nostrils daily.     furosemide (LASIX) 40 MG tablet Take 1 tablet (40 mg total) by mouth daily after breakfast. 3 tablet 0   glucose blood (ONE TOUCH ULTRA TEST) test strip Use to check blood sugar 3 times daily Dx code E11.65 (Patient taking differently: 1 each by Other route See admin instructions. Use to check blood sugar 3 times daily Dx code E11.65) 100 each 3   meclizine (ANTIVERT) 25 MG tablet Take 25 mg by mouth 3 (three) times daily as needed for dizziness.     omeprazole (PRILOSEC) 40 MG capsule Take 40 mg by mouth daily.     ONETOUCH VERIO test strip CHECK BLOOD SUGAR THREE TIMES DAILY (Patient taking differently: 1 each by Other route in the morning, at noon, and at bedtime.) 200 each 0   OZEMPIC, 0.25 OR 0.5 MG/DOSE, 2 MG/1.5ML SOPN Inject 0.25 mg into the skin once a week. Sundays     polyethylene glycol (MIRALAX / GLYCOLAX) 17 g packet Take 17 g by mouth daily. (Patient not taking: Reported on 10/25/2021) 14 each 0   rosuvastatin (CRESTOR) 20 MG tablet Take 20 mg by mouth daily.     sucralfate (CARAFATE) 1 g tablet Take 1 tablet (1 g total) by mouth 4 (four) times daily -  with meals and at bedtime. 120 tablet 0    PHYSICAL EXAM Vitals:   07/01/22 1745 07/01/22 1800 07/01/22 1815 07/01/22 1834  BP: (!) 151/76 (!) 157/79 (!) 194/83 (!) 164/78  Pulse:   99 92  Resp: 16 16 (!) 26 17  Temp:      SpO2:   100% 100%   Elderly woman in no acute distress Regular rate and rhythm Unlabored breathing Easily palpable radial pulses  bilaterally He easily palpable carotid pulses bilaterally No ptosis or facial droop appreciated  PERTINENT LABORATORY AND RADIOLOGIC DATA  Most recent CBC    Latest Ref Rng & Units 07/01/2022    4:52 PM 06/15/2022    7:14 PM 05/30/2022   10:04 PM  CBC  WBC 4.0 - 10.5 K/uL 6.3  6.4  6.6   Hemoglobin 12.0 - 15.0 g/dL 9.3  9.4  10.2  Hematocrit 36.0 - 46.0 % 29.2  29.2  33.4   Platelets 150 - 400 K/uL 278  256  243      Most recent CMP    Latest Ref Rng & Units 07/01/2022    4:52 PM 06/15/2022    7:14 PM 05/30/2022   10:04 PM  CMP  Glucose 70 - 99 mg/dL 128  140  115   BUN 8 - 23 mg/dL 52  56  58   Creatinine 0.44 - 1.00 mg/dL 3.60  2.48  3.31   Sodium 135 - 145 mmol/L 139  141  140   Potassium 3.5 - 5.1 mmol/L 4.8  5.0  5.1   Chloride 98 - 111 mmol/L 108  115  109   CO2 22 - 32 mmol/L '21  22  21   '$ Calcium 8.9 - 10.3 mg/dL 8.9  8.5  8.8   Total Protein 6.5 - 8.1 g/dL 7.5  7.0    Total Bilirubin 0.3 - 1.2 mg/dL 0.6  0.3    Alkaline Phos 38 - 126 U/L 56  61    AST 15 - 41 U/L 29  19    ALT 0 - 44 U/L 18  11      Renal function CrCl cannot be calculated (Unknown ideal weight.).  Hemoglobin A1C (no units)  Date Value  03/17/2016 6.9   Hgb A1c MFr Bld (%)  Date Value  04/18/2022 6.1 (H)    LDL Cholesterol  Date Value Ref Range Status  04/18/2022 70 0 - 99 mg/dL Final    Comment:           Total Cholesterol/HDL:CHD Risk Coronary Heart Disease Risk Table                     Men   Women  1/2 Average Risk   3.4   3.3  Average Risk       5.0   4.4  2 X Average Risk   9.6   7.1  3 X Average Risk  23.4   11.0        Use the calculated Patient Ratio above and the CHD Risk Table to determine the patient's CHD Risk.        ATP III CLASSIFICATION (LDL):  <100     mg/dL   Optimal  100-129  mg/dL   Near or Above                    Optimal  130-159  mg/dL   Borderline  160-189  mg/dL   High  >190     mg/dL   Very High Performed at [facility]  [address], [place], [place] [address]    Direct LDL  Date Value Ref Range Status  09/20/2019 111.0 mg/dL Final    Comment:    Optimal:  <100 mg/dLNear or Above Optimal:  100-129 mg/dLBorderline High:  130-159 mg/dLHigh:  160-189 mg/dLVery High:  >190 mg/dL     Teresa Poole. Stanford Breed, MD [facility] Phone Number: [phone] 07/01/2022 9:33 PM   Total time spent on preparing this encounter including chart review, data review, collecting history, examining the patient, coordinating care for this new patient, 60 minutes.  Portions of this report may have been transcribed using voice recognition software.  Every effort has been made to ensure accuracy; however, inadvertent computerized transcription errors may still be present.

## 2022-07-08 ENCOUNTER — Other Ambulatory Visit: Payer: Self-pay

## 2022-07-08 ENCOUNTER — Emergency Department (HOSPITAL_COMMUNITY)
Admission: EM | Admit: 2022-07-08 | Discharge: 2022-07-08 | Disposition: A | Discharge status: Home / Self Care | Payer: Medicare PPO | Attending: Emergency Medicine | Admitting: Emergency Medicine

## 2022-07-08 DIAGNOSIS — E119 Type 2 diabetes mellitus without complications: Secondary | ICD-10-CM | POA: Insufficient documentation

## 2022-07-08 DIAGNOSIS — I110 Hypertensive heart disease with heart failure: Secondary | ICD-10-CM | POA: Diagnosis not present

## 2022-07-08 DIAGNOSIS — I2510 Atherosclerotic heart disease of native coronary artery without angina pectoris: Secondary | ICD-10-CM | POA: Insufficient documentation

## 2022-07-08 DIAGNOSIS — W57XXXA Bitten or stung by nonvenomous insect and other nonvenomous arthropods, initial encounter: Secondary | ICD-10-CM | POA: Insufficient documentation

## 2022-07-08 DIAGNOSIS — R21 Rash and other nonspecific skin eruption: Secondary | ICD-10-CM | POA: Insufficient documentation

## 2022-07-08 DIAGNOSIS — I509 Heart failure, unspecified: Secondary | ICD-10-CM | POA: Insufficient documentation

## 2022-07-08 MED ORDER — DEXAMETHASONE 4 MG PO TABS: 10.0000 mg | ORAL_TABLET | Freq: Once | ORAL | Status: DC

## 2022-07-08 MED ORDER — PREDNISONE 20 MG PO TABS
40.0000 mg | ORAL_TABLET | Freq: Once | ORAL | Status: AC
  Administered 2022-07-08: 40 mg via ORAL
  Filled 2022-07-08: qty 2

## 2022-07-08 NOTE — ED Provider Triage Note (Addendum)
Emergency Medicine Provider Triage Evaluation Note  Teresa Poole , a 71 y.o. female  was evaluated in triage.  Pt complains of rash to both arms starting yesterday. It is itchy. She also complains of burning from her throat all the way down her GI tract and into her rectum. She also complains of memory issues going on for the past 6 months. No new medications or abx use  Review of Systems  Positive:  Negative:   Physical Exam  BP 139/69 (BP Location: Left Arm)   Pulse 73   Temp 98.4 F (36.9 C) (Oral)   Resp 18   SpO2 100%  Gen:   Awake, no distress   Resp:  Normal effort  MSK:   Moves extremities without difficulty  Other:  A few erythematous papules on both arms. Dry scaly skin diffuse over body.   Medical Decision Making  Medically screening exam initiated at 12:43 PM.  Appropriate orders placed.  LANDON TRUAX was informed that the remainder of the evaluation will be completed by another provider, this initial triage assessment does not replace that evaluation, and the importance of remaining in the ED until their evaluation is complete.     Adolphus Birchwood, PA-C 07/08/22 1244    Adolphus Birchwood, PA-C 07/08/22 1244

## 2022-07-08 NOTE — ED Provider Notes (Signed)
[facility] DEPT Provider Note   CSN: [id number] Arrival date & time: 07/08/22  1206     History  Chief Complaint  Patient presents with   Rash    Teresa Poole is a 71 y.o. female.  Patient is a 71 year old female with a history of diabetes, hypertension, CHF, CAD, anemia, GERD, thyroid disease who is presenting today with complaint of rash on her arms that is itching.  It started yesterday.  She did find some bugs in her house but was not sure if that was the cause of it.  It is mostly been on her upper extremities.  She denies any shortness of breath, new swelling in her legs or other issues at this time.  The history is provided by the patient.  Rash      Home Medications Prior to Admission medications   Medication Sig Start Date End Date Taking? Authorizing Provider  albuterol (VENTOLIN HFA) 108 (90 Base) MCG/ACT inhaler Inhale 2 puffs into the lungs every 4 (four) hours as needed for wheezing or shortness of breath. Patient not taking: Reported on 02/28/2022 10/23/21   [provider]  carvedilol (COREG) 25 MG tablet Take 25 mg by mouth 2 (two) times daily with a meal.    [provider]  cetirizine (ZYRTEC) 10 MG tablet Take 10 mg by mouth daily as needed for allergies.    [provider]  cycloSPORINE, PF, (CEQUA) 0.09 % SOLN Place 1 drop into both eyes 2 (two) times daily. 12/18/20   [provider]  famotidine (PEPCID) 40 MG tablet Take 40 mg by mouth daily. 09/04/21   [provider]  fluticasone (FLONASE) 50 MCG/ACT nasal spray Place 2 sprays into both nostrils daily.    [provider]  furosemide (LASIX) 40 MG tablet Take 1 tablet (40 mg total) by mouth daily after breakfast. 8/46/65   Delora Fuel, MD  glucose blood (ONE TOUCH ULTRA TEST) test strip Use to check blood sugar 3 times daily Dx code E11.65 Patient taking differently: 1 each by Other route See admin instructions. Use to  check blood sugar 3 times daily Dx code E11.65 11/20/14   Elayne Snare, MD  meclizine (ANTIVERT) 25 MG tablet Take 25 mg by mouth 3 (three) times daily as needed for dizziness.    [provider]  omeprazole (PRILOSEC) 40 MG capsule Take 40 mg by mouth daily. 03/10/22   [provider]  ONETOUCH VERIO test strip [place] Patient taking differently: 1 each by Other route in the morning, at noon, and at bedtime. 03/30/18   Elayne Snare, MD  OZEMPIC, 0.25 OR 0.5 MG/DOSE, 2 MG/1.5ML SOPN Inject 0.25 mg into the skin once a week. Sundays 05/12/21   [provider]  polyethylene glycol (MIRALAX / GLYCOLAX) 17 g packet Take 17 g by mouth daily. Patient not taking: Reported on 10/25/2021 09/28/21   Eugenie Filler, MD  rosuvastatin (CRESTOR) 20 MG tablet Take 20 mg by mouth daily.    [provider]  sucralfate (CARAFATE) 1 g tablet Take 1 tablet (1 g total) by mouth 4 (four) times daily -  with meals and at bedtime. 05/23/22   Montine Circle, PA-C      Allergies    Hydralazine hcl and Atorvastatin    Review of Systems   Review of Systems  Skin:  Positive for rash.    Physical Exam Updated Vital Signs BP 139/69 (BP Location: Left Arm)  Pulse 73   Temp 98.4 F (36.9 C) (Oral)   Resp 18   SpO2 100%  Physical Exam Vitals and nursing note reviewed.  HENT:     Head: Normocephalic and atraumatic.  Musculoskeletal:     Comments: Trace edema bilateral ankles  Skin:    Comments: Small raised papules over the bilateral upper extremities with some excoriation.  No specific pattern  Neurological:     Mental Status: She is alert. Mental status is at baseline.     ED Results / Procedures / Treatments   Labs (all labs ordered are listed, but only abnormal results are displayed) Labs Reviewed - No data to display  EKG None  Radiology No results found.  Procedures Procedures    Medications Ordered in ED Medications  predniSONE  (DELTASONE) tablet 40 mg (has no administration in time range)    ED Course/ Medical Decision Making/ A&P                           Medical Decision Making  Patient presenting today with complaint of an itchy rash.  It appears to be a bite of some sort.  Concern for possible bedbugs or other mites.  Lower suspicion for scabies at this time.  Patient is otherwise well-appearing.  No indication for further testing at this time.  Encouraged her to follow-up with her exterminator so they can check her house.  Also encouraged follow-up with her PCP.        Final Clinical Impression(s) / ED Diagnoses Final diagnoses:  Insect bite, unspecified site, initial encounter    Rx / DC Orders ED Discharge Orders     None         Blanchie Dessert, MD 07/08/22 1558

## 2022-07-08 NOTE — Discharge Instructions (Signed)
It looks like you are getting bit by something.  You may want an exterminator to come out to your house to check.  You can also try hydrocortisone cream on your arms.

## 2022-07-08 NOTE — ED Triage Notes (Signed)
Pt reports itching all over and generalized rash to body.

## 2022-07-21 ENCOUNTER — Other Ambulatory Visit: Payer: Self-pay

## 2022-07-21 ENCOUNTER — Emergency Department (HOSPITAL_COMMUNITY)
Admission: EM | Admit: 2022-07-21 | Discharge: 2022-07-21 | Disposition: A | Discharge status: Home / Self Care | Payer: Medicare PPO | Attending: Emergency Medicine | Admitting: Emergency Medicine

## 2022-07-21 DIAGNOSIS — I10 Essential (primary) hypertension: Secondary | ICD-10-CM | POA: Insufficient documentation

## 2022-07-21 DIAGNOSIS — R0981 Nasal congestion: Secondary | ICD-10-CM | POA: Diagnosis present

## 2022-07-21 DIAGNOSIS — B9789 Other viral agents as the cause of diseases classified elsewhere: Secondary | ICD-10-CM | POA: Diagnosis not present

## 2022-07-21 DIAGNOSIS — E119 Type 2 diabetes mellitus without complications: Secondary | ICD-10-CM | POA: Diagnosis not present

## 2022-07-21 DIAGNOSIS — Z20822 Contact with and (suspected) exposure to covid-19: Secondary | ICD-10-CM | POA: Diagnosis not present

## 2022-07-21 DIAGNOSIS — J069 Acute upper respiratory infection, unspecified: Secondary | ICD-10-CM | POA: Insufficient documentation

## 2022-07-21 DIAGNOSIS — Z79899 Other long term (current) drug therapy: Secondary | ICD-10-CM | POA: Diagnosis not present

## 2022-07-21 LAB — RESP PANEL BY RT-PCR (FLU A&B, COVID) ARPGX2
Influenza A by PCR: NEGATIVE
Influenza B by PCR: NEGATIVE
SARS Coronavirus 2 by RT PCR: NEGATIVE

## 2022-07-21 MED ORDER — GUAIFENESIN 100 MG/5ML PO LIQD: 100.0000 mg | Freq: Four times a day (QID) | ORAL | 0 refills | Status: DC | PRN

## 2022-07-21 NOTE — ED Provider Notes (Signed)
[facility] DEPT Provider Note   CSN: [id number] Arrival date & time: 07/21/22  1002     History  Chief Complaint  Patient presents with   Nasal Congestion    Teresa Poole is a 71 y.o. female.  The history is provided by the patient and medical records. No language interpreter was used.     71 year old female with a known history of diabetes, seasonal allergies, GERD, hypertension, thyroid disease, presenting with complaints of cold symptoms.  Patient report for the past 2 to 3 days she has had sinus congestion, chest congestion, and dryness in her nose.  She does not endorse any fever or chills no chest pain or shortness of breath no productive cough no sneezing or sore throat.  She has been using her fluticasone spray but noticed no improvement.  She denies any recent sick contact.  Symptoms mild.  Home Medications Prior to Admission medications   Medication Sig Start Date End Date Taking? Authorizing Provider  albuterol (VENTOLIN HFA) 108 (90 Base) MCG/ACT inhaler Inhale 2 puffs into the lungs every 4 (four) hours as needed for wheezing or shortness of breath. Patient not taking: Reported on 02/28/2022 10/23/21   [provider]  carvedilol (COREG) 25 MG tablet Take 25 mg by mouth 2 (two) times daily with a meal.    [provider]  cetirizine (ZYRTEC) 10 MG tablet Take 10 mg by mouth daily as needed for allergies.    [provider]  cycloSPORINE, PF, (CEQUA) 0.09 % SOLN Place 1 drop into both eyes 2 (two) times daily. 12/18/20   [provider]  famotidine (PEPCID) 40 MG tablet Take 40 mg by mouth daily. 09/04/21   [provider]  fluticasone (FLONASE) 50 MCG/ACT nasal spray Place 2 sprays into both nostrils daily.    [provider]  furosemide (LASIX) 40 MG tablet Take 1 tablet (40 mg total) by mouth daily after breakfast. 05/16/29   Delora Fuel, MD  glucose blood (ONE TOUCH ULTRA TEST) test  strip Use to check blood sugar 3 times daily Dx code E11.65 Patient taking differently: 1 each by Other route See admin instructions. Use to check blood sugar 3 times daily Dx code E11.65 11/20/14   Elayne Snare, MD  meclizine (ANTIVERT) 25 MG tablet Take 25 mg by mouth 3 (three) times daily as needed for dizziness.    [provider]  omeprazole (PRILOSEC) 40 MG capsule Take 40 mg by mouth daily. 03/10/22   [provider]  ONETOUCH VERIO test strip [place] Patient taking differently: 1 each by Other route in the morning, at noon, and at bedtime. 03/30/18   Elayne Snare, MD  OZEMPIC, 0.25 OR 0.5 MG/DOSE, 2 MG/1.5ML SOPN Inject 0.25 mg into the skin once a week. Sundays 05/12/21   [provider]  polyethylene glycol (MIRALAX / GLYCOLAX) 17 g packet Take 17 g by mouth daily. Patient not taking: Reported on 10/25/2021 09/28/21   Eugenie Filler, MD  rosuvastatin (CRESTOR) 20 MG tablet Take 20 mg by mouth daily.    [provider]  sucralfate (CARAFATE) 1 g tablet Take 1 tablet (1 g total) by mouth 4 (four) times daily -  with meals and at bedtime. 05/23/22   Montine Circle, PA-C      Allergies    Hydralazine hcl and Atorvastatin    Review of Systems   Review of Systems  All other systems reviewed and are negative.  Physical Exam Updated Vital Signs BP (!) 129/57 (BP Location: Left Arm)   Pulse 74   Temp 98.2 F (36.8 C) (Oral)   Resp 16   Ht '5\' 7"'$  (1.702 m)   Wt 75.7 kg   SpO2 99%   BMI 26.14 kg/m  Physical Exam Vitals and nursing note reviewed.  Constitutional:      General: She is not in acute distress.    Appearance: She is well-developed. She is obese.  HENT:     Head: Atraumatic.     Nose: Congestion present. No rhinorrhea.     Comments: Some crust were noted in either nares    Mouth/Throat:     Mouth: Mucous membranes are moist.  Eyes:     Conjunctiva/sclera: Conjunctivae normal.  Cardiovascular:     Rate  and Rhythm: Normal rate and regular rhythm.     Pulses: Normal pulses.     Heart sounds: Normal heart sounds.  Pulmonary:     Effort: Pulmonary effort is normal.     Breath sounds: No wheezing, rhonchi or rales.  Musculoskeletal:     Cervical back: Normal range of motion and neck supple.  Skin:    Findings: No rash.  Neurological:     Mental Status: She is alert.  Psychiatric:        Mood and Affect: Mood normal.     ED Results / Procedures / Treatments   Labs (all labs ordered are listed, but only abnormal results are displayed) Labs Reviewed  RESP PANEL BY RT-PCR (FLU A&B, COVID) [id number]    EKG None  Radiology No results found.  Procedures Procedures    Medications Ordered in ED Medications - No data to display  ED Course/ Medical Decision Making/ A&P                           Medical Decision Making  BP (!) 129/57 (BP Location: Left Arm)   Pulse 74   Temp 98.2 F (36.8 C) (Oral)   Resp 16   Ht '5\' 7"'$  (1.702 m)   Wt 75.7 kg   SpO2 99%   BMI 26.14 kg/m   50:63 AM 71 year old female with a known history of diabetes, seasonal allergies, GERD, hypertension, thyroid disease, presenting with complaints of cold symptoms.  Patient report for the past 2 to 3 days she has had sinus congestion, chest congestion, and dryness in her nose.  She does not endorse any fever or chills no chest pain or shortness of breath no productive cough no sneezing or sore throat.  She has been using her fluticasone spray but noticed no improvement.  She denies any recent sick contact.  Symptoms mild.  On exam this is a well-appearing elderly female appears to be in no acute discomfort.  She is smiling talking.  Vital signs normal she is afebrile no hypoxia.  She does have some crusting noted in her nares consistent with congestion.  She is able to breathe through her nose.  Throat exam unremarkable.  Heart and lung sounds normal.  Suspect viral etiology causing her symptoms.  Patient  overall well-appearing low suspicion for pneumonia, sinusitis, seasonal allergies.  COVID and flu test have been ordered.  Will discharge home with symptomatic care and patient may follow-up the results of her COVID and flu test through [facility].  -DDx: sinusitis, covid, flu, viral uri, seasonal allergy -treatment includes none -PCP office notes or outside notes reviewed -Escalation to admission/observation considered:  patients feels much better, is comfortable with discharge, and will follow up with PCP -Prescription medication considered, patient comfortable with guaifenesin  -Social Determinant of Health considered         Final Clinical Impression(s) / ED Diagnoses Final diagnoses:  Viral upper respiratory tract infection    Rx / DC Orders ED Discharge Orders          Ordered    guaiFENesin (ROBITUSSIN) 100 MG/5ML liquid  Every 6 hours PRN        07/21/22 1139              Domenic Moras, PA-C 07/21/22 1146    Gareth Morgan, MD 07/24/22 [phone]

## 2022-07-21 NOTE — ED Triage Notes (Addendum)
Pt reports "nose dryness" has been using Fluticasone but reports it's not helping. No other complaints

## 2022-07-21 NOTE — Discharge Instructions (Signed)
You have symptoms likely due to an upper respiratory infection.  A COVID and flu test have been obtained today and you can check the result through MyChart.  You may take guaifenesin medication to help with congestion.  You may use fluticasone nasal spray as needed if you develop sneezing or itchy eyes.  Follow-up with your doctor for further care, return if you have any concern.

## 2022-07-24 ENCOUNTER — Other Ambulatory Visit: Payer: Self-pay

## 2022-07-24 ENCOUNTER — Emergency Department (HOSPITAL_COMMUNITY)
Admission: EM | Admit: 2022-07-24 | Discharge: 2022-07-25 | Disposition: A | Discharge status: Home / Self Care | Payer: Medicare PPO | Attending: Emergency Medicine | Admitting: Emergency Medicine

## 2022-07-24 DIAGNOSIS — R030 Elevated blood-pressure reading, without diagnosis of hypertension: Secondary | ICD-10-CM

## 2022-07-24 DIAGNOSIS — R0981 Nasal congestion: Secondary | ICD-10-CM | POA: Diagnosis not present

## 2022-07-24 DIAGNOSIS — I2510 Atherosclerotic heart disease of native coronary artery without angina pectoris: Secondary | ICD-10-CM | POA: Diagnosis not present

## 2022-07-24 DIAGNOSIS — N184 Chronic kidney disease, stage 4 (severe): Secondary | ICD-10-CM | POA: Diagnosis not present

## 2022-07-24 DIAGNOSIS — E1122 Type 2 diabetes mellitus with diabetic chronic kidney disease: Secondary | ICD-10-CM | POA: Diagnosis not present

## 2022-07-24 DIAGNOSIS — N3000 Acute cystitis without hematuria: Secondary | ICD-10-CM | POA: Diagnosis not present

## 2022-07-24 DIAGNOSIS — I5042 Chronic combined systolic (congestive) and diastolic (congestive) heart failure: Secondary | ICD-10-CM | POA: Insufficient documentation

## 2022-07-24 DIAGNOSIS — I130 Hypertensive heart and chronic kidney disease with heart failure and stage 1 through stage 4 chronic kidney disease, or unspecified chronic kidney disease: Secondary | ICD-10-CM | POA: Diagnosis not present

## 2022-07-24 DIAGNOSIS — R300 Dysuria: Secondary | ICD-10-CM | POA: Diagnosis present

## 2022-07-24 NOTE — ED Triage Notes (Signed)
Pt in with nasal congestion, began this morning when she woke up. Reports using less Fluticasone than recently ordered. Denies any cough or cp

## 2022-07-25 LAB — URINALYSIS, ROUTINE W REFLEX MICROSCOPIC
Bilirubin Urine: NEGATIVE
Glucose, UA: NEGATIVE mg/dL
Hgb urine dipstick: NEGATIVE
Ketones, ur: NEGATIVE mg/dL
Nitrite: NEGATIVE
Protein, ur: >=300 mg/dL — AB
Specific Gravity, Urine: 1.012 (ref 1.005–1.030)
pH: 5 (ref 5.0–8.0)

## 2022-07-25 MED ORDER — CEPHALEXIN 500 MG PO CAPS: 500.0000 mg | ORAL_CAPSULE | Freq: Three times a day (TID) | ORAL | 0 refills | Status: AC

## 2022-07-25 NOTE — ED Provider Notes (Signed)
[facility] EMERGENCY DEPARTMENT Provider Note   CSN: [id number] Arrival date & time: 07/24/22  2048     History  Chief Complaint  Patient presents with   Nasal Congestion   poss uti    Teresa Poole is a 71 y.o. female.  71 year old female presents today for evaluation of dysuria, sinus congestion.  Sinus congestion has been ongoing for several days.  Recently evaluated on 11/28 for URI symptoms.  Negative for COVID, flu.  States dysuria started today.  Denies flank pain, fever, nausea, vomiting, or abdominal pain.  The history is provided by the patient. No language interpreter was used.       Home Medications Prior to Admission medications   Medication Sig Start Date End Date Taking? Authorizing Provider  albuterol (VENTOLIN HFA) 108 (90 Base) MCG/ACT inhaler Inhale 2 puffs into the lungs every 4 (four) hours as needed for wheezing or shortness of breath. Patient not taking: Reported on 02/28/2022 10/23/21   [provider]  carvedilol (COREG) 25 MG tablet Take 25 mg by mouth 2 (two) times daily with a meal.    [provider]  cetirizine (ZYRTEC) 10 MG tablet Take 10 mg by mouth daily as needed for allergies.    [provider]  cycloSPORINE, PF, (CEQUA) 0.09 % SOLN Place 1 drop into both eyes 2 (two) times daily. 12/18/20   [provider]  famotidine (PEPCID) 40 MG tablet Take 40 mg by mouth daily. 09/04/21   [provider]  fluticasone (FLONASE) 50 MCG/ACT nasal spray Place 2 sprays into both nostrils daily.    [provider]  furosemide (LASIX) 40 MG tablet Take 1 tablet (40 mg total) by mouth daily after breakfast. 9/56/21   Delora Fuel, MD  glucose blood (ONE TOUCH ULTRA TEST) test strip Use to check blood sugar 3 times daily Dx code E11.65 Patient taking differently: 1 each by Other route See admin instructions. Use to check blood sugar 3 times daily Dx code E11.65 11/20/14   Elayne Snare, MD   guaiFENesin (ROBITUSSIN) 100 MG/5ML liquid Take 5-10 mLs (100-200 mg total) by mouth every 6 (six) hours as needed for cough or to loosen phlegm. 07/21/22   Domenic Moras, PA-C  meclizine (ANTIVERT) 25 MG tablet Take 25 mg by mouth 3 (three) times daily as needed for dizziness.    [provider]  omeprazole (PRILOSEC) 40 MG capsule Take 40 mg by mouth daily. 03/10/22   [provider]  ONETOUCH VERIO test strip [place] Patient taking differently: 1 each by Other route in the morning, at noon, and at bedtime. 03/30/18   Elayne Snare, MD  OZEMPIC, 0.25 OR 0.5 MG/DOSE, 2 MG/1.5ML SOPN Inject 0.25 mg into the skin once a week. Sundays 05/12/21   [provider]  polyethylene glycol (MIRALAX / GLYCOLAX) 17 g packet Take 17 g by mouth daily. Patient not taking: Reported on 10/25/2021 09/28/21   Eugenie Filler, MD  rosuvastatin (CRESTOR) 20 MG tablet Take 20 mg by mouth daily.    [provider]  sucralfate (CARAFATE) 1 g tablet Take 1 tablet (1 g total) by mouth 4 (four) times daily -  with meals and at bedtime. 05/23/22   Montine Circle, PA-C      Allergies    Hydralazine hcl and Atorvastatin    Review of Systems   Review of Systems  HENT:  Positive for congestion. Negative for sore throat.   Respiratory:  Negative for cough and shortness of breath.   Cardiovascular:  Negative for chest pain.  Genitourinary:  Positive for dysuria. Negative for flank pain and frequency.  Neurological:  Negative for light-headedness and headaches.  All other systems reviewed and are negative.   Physical Exam Updated Vital Signs BP (!) 188/66   Pulse 82   Temp 98 F (36.7 C) (Oral)   Resp 17   Wt 75.7 kg   SpO2 100%   BMI 26.14 kg/m  Physical Exam Vitals and nursing note reviewed.  Constitutional:      General: She is not in acute distress.    Appearance: Normal appearance. She is not ill-appearing.  HENT:     Head: Normocephalic and  atraumatic.     Right Ear: Tympanic membrane, ear canal and external ear normal. There is no impacted cerumen.     Left Ear: Tympanic membrane, ear canal and external ear normal. There is no impacted cerumen.     Nose: Nose normal.     Mouth/Throat:     Mouth: Mucous membranes are moist.     Pharynx: No oropharyngeal exudate or posterior oropharyngeal erythema.  Eyes:     General: No scleral icterus.    Extraocular Movements: Extraocular movements intact.     Conjunctiva/sclera: Conjunctivae normal.  Cardiovascular:     Rate and Rhythm: Normal rate and regular rhythm.     Pulses: Normal pulses.  Pulmonary:     Effort: Pulmonary effort is normal. No respiratory distress.     Breath sounds: Normal breath sounds. No wheezing or rales.  Abdominal:     General: There is no distension.     Tenderness: There is no abdominal tenderness. There is no right CVA tenderness, left CVA tenderness or guarding.  Musculoskeletal:        General: No deformity. Normal range of motion.     Cervical back: Normal range of motion.  Skin:    General: Skin is warm and dry.     Findings: No rash.  Neurological:     General: No focal deficit present.     Mental Status: She is alert. Mental status is at baseline.     ED Results / Procedures / Treatments   Labs (all labs ordered are listed, but only abnormal results are displayed) Labs Reviewed  URINALYSIS, ROUTINE W REFLEX MICROSCOPIC    EKG None  Radiology No results found.  Procedures Procedures    Medications Ordered in ED Medications - No data to display  ED Course/ Medical Decision Making/ A&P                           Medical Decision Making Amount and/or Complexity of Data Reviewed Labs: ordered.  Risk Prescription drug management.   Medical Decision Making / ED Course   This patient presents to the ED for concern of nasal congestion, dysuria, this involves an extensive number of treatment options, and is a complaint that  carries with it a high risk of complications and morbidity.  The differential diagnosis includes sinusitis, ongoing viral URI symptoms, UTI, pyelonephritis  MDM: 71 year old female presents today for evaluation of dysuria, sinus congestion.  Recently evaluated for URI symptoms.  Negative for COVID and flu.  Dysuria started today.  No flank pain, fever or other symptoms concerning for pyelonephritis.  UA shows concern for UTI.  Will start on Keflex.  Will send for culture.  Patient is appropriate for discharge.  Discharge stable  condition.  Return precautions discussed.  Patient voices understanding is agreement with plan.   Lab Tests: -I ordered, reviewed, and interpreted labs.   The pertinent results include:   Labs Reviewed  URINALYSIS, ROUTINE W REFLEX MICROSCOPIC - Abnormal; Notable for the following components:      Result Value   Color, Urine STRAW (*)    Protein, ur >=300 (*)    Leukocytes,Ua MODERATE (*)    Bacteria, UA RARE (*)    All other components within normal limits      EKG  EKG Interpretation  Date/Time:    Ventricular Rate:    PR Interval:    QRS Duration:   QT Interval:    QTC Calculation:   R Axis:     Text Interpretation:          Medicines ordered and prescription drug management: No orders of the defined types were placed in this encounter.   -I have reviewed the patients home medicines and have made adjustments as needed  Reevaluation: After the interventions noted above, I reevaluated the patient and found that they have :stayed the same  Co morbidities that complicate the patient evaluation  Past Medical History:  Diagnosis Date   Allergy    Anemia    Carotid stenosis    Chronic combined systolic and diastolic CHF (congestive heart failure) (HCC)    CKD (chronic kidney disease), stage IV ([place]) 09/24/2013   Pt at [facility], Dr. Joelyn Oms   COVID-19 10/24/2021   Diabetes mellitus type 2 in nonobese [facility])    Edema 09/14/2013   GERD  (gastroesophageal reflux disease)    History of ovarian cancer    Hyperlipidemia    Hypertension    Mild CAD    non-obstructive by LHC (09/25/2013): Proximal and mid LAD serial 20%, proximal circumflex 30%, mid AV groove circumflex 30%, mid RCA mild plaque.   NICM (nonischemic cardiomyopathy) (HCC)    Obesity (BMI 30-39.9)    RBBB    Renal artery stenosis ([place]) 10/11/2018   Thyroid disease    Seen by specialist      Dispostion: Patient is appropriate for discharge.  Discharged in stable condition.  Return precaution discussed.  Blood pressure elevated.  Discussed keeping a blood pressure diary and following up with PCP.  Without chest pain, shortness of breath, or other signs concerning for endorgan damage.  Final Clinical Impression(s) / ED Diagnoses Final diagnoses:  Nasal congestion  Acute cystitis without hematuria  Elevated blood pressure reading    Rx / DC Orders ED Discharge Orders          Ordered    cephALEXin (KEFLEX) 500 MG capsule  3 times daily        07/25/22 0421              Evlyn Courier, PA-C 07/25/22 0424    Quintella Reichert, MD 07/25/22 [phone]

## 2022-07-25 NOTE — ED Notes (Signed)
The pt is c/o painful urination for several days

## 2022-07-25 NOTE — ED Notes (Signed)
Discharge instructions reviewed with pt. No questions. Verbalized understanding, VSS. Ambulating to waiting room.

## 2022-07-25 NOTE — Discharge Instructions (Addendum)
Your sinus congestion is likely secondary to the recent viral upper respiratory infection he had.  You can use sinus rinse to help with this.  Your urine today showed that you likely have a UTI.  Have sent antibiotics into the pharmacy for you.  We also will send a urine culture to confirm that you have a urinary tract infection.  For any concerning symptoms return to the emergency room otherwise follow-up with your primary care provider. Your blood pressure is elevated today.  Keep a blood pressure diary and follow-up with your primary care provider.  If you develop chest pain, shortness of breath please return for evaluation.

## 2022-08-30 ENCOUNTER — Emergency Department (HOSPITAL_COMMUNITY): Payer: Medicare PPO

## 2022-08-30 ENCOUNTER — Other Ambulatory Visit: Payer: Self-pay

## 2022-08-30 ENCOUNTER — Emergency Department (HOSPITAL_COMMUNITY)
Admission: EM | Admit: 2022-08-30 | Discharge: 2022-08-30 | Disposition: A | Discharge status: Home / Self Care | Payer: Medicare PPO | Attending: Emergency Medicine | Admitting: Emergency Medicine

## 2022-08-30 DIAGNOSIS — R0789 Other chest pain: Secondary | ICD-10-CM | POA: Diagnosis not present

## 2022-08-30 DIAGNOSIS — Z79899 Other long term (current) drug therapy: Secondary | ICD-10-CM | POA: Diagnosis not present

## 2022-08-30 DIAGNOSIS — R0981 Nasal congestion: Secondary | ICD-10-CM | POA: Insufficient documentation

## 2022-08-30 DIAGNOSIS — N189 Chronic kidney disease, unspecified: Secondary | ICD-10-CM | POA: Insufficient documentation

## 2022-08-30 DIAGNOSIS — R531 Weakness: Secondary | ICD-10-CM | POA: Insufficient documentation

## 2022-08-30 DIAGNOSIS — E1122 Type 2 diabetes mellitus with diabetic chronic kidney disease: Secondary | ICD-10-CM | POA: Diagnosis not present

## 2022-08-30 DIAGNOSIS — R0602 Shortness of breath: Secondary | ICD-10-CM | POA: Insufficient documentation

## 2022-08-30 DIAGNOSIS — Z20822 Contact with and (suspected) exposure to covid-19: Secondary | ICD-10-CM | POA: Insufficient documentation

## 2022-08-30 DIAGNOSIS — I2510 Atherosclerotic heart disease of native coronary artery without angina pectoris: Secondary | ICD-10-CM | POA: Insufficient documentation

## 2022-08-30 DIAGNOSIS — I130 Hypertensive heart and chronic kidney disease with heart failure and stage 1 through stage 4 chronic kidney disease, or unspecified chronic kidney disease: Secondary | ICD-10-CM | POA: Diagnosis not present

## 2022-08-30 DIAGNOSIS — Z8543 Personal history of malignant neoplasm of ovary: Secondary | ICD-10-CM | POA: Diagnosis not present

## 2022-08-30 DIAGNOSIS — I509 Heart failure, unspecified: Secondary | ICD-10-CM | POA: Insufficient documentation

## 2022-08-30 LAB — CBC
HCT: 28.3 % — ABNORMAL LOW (ref 36.0–46.0)
Hemoglobin: 9 g/dL — ABNORMAL LOW (ref 12.0–15.0)
MCH: 28 pg (ref 26.0–34.0)
MCHC: 31.8 g/dL (ref 30.0–36.0)
MCV: 88.2 fL (ref 80.0–100.0)
Platelets: 264 10*3/uL (ref 150–400)
RBC: 3.21 MIL/uL — ABNORMAL LOW (ref 3.87–5.11)
RDW: 14 % (ref 11.5–15.5)
WBC: 5.5 10*3/uL (ref 4.0–10.5)
nRBC: 0 % (ref 0.0–0.2)

## 2022-08-30 LAB — TROPONIN I (HIGH SENSITIVITY)
Troponin I (High Sensitivity): 17 ng/L (ref <18)
Troponin I (High Sensitivity): 16 ng/L (ref <18)

## 2022-08-30 LAB — COMPREHENSIVE METABOLIC PANEL
ALT: 11 U/L (ref 0–44)
AST: 20 U/L (ref 15–41)
Albumin: 3.1 g/dL — ABNORMAL LOW (ref 3.5–5.0)
Alkaline Phosphatase: 55 U/L (ref 38–126)
Anion gap: 7 (ref 5–15)
BUN: 50 mg/dL — ABNORMAL HIGH (ref 8–23)
CO2: 22 mmol/L (ref 22–32)
Calcium: 8.8 mg/dL — ABNORMAL LOW (ref 8.9–10.3)
Chloride: 108 mmol/L (ref 98–111)
Creatinine, Ser: 3.31 mg/dL — ABNORMAL HIGH (ref 0.44–1.00)
GFR, Estimated: 14 mL/min — ABNORMAL LOW (ref >60)
Glucose, Bld: 164 mg/dL — ABNORMAL HIGH (ref 70–99)
Potassium: 4.2 mmol/L (ref 3.5–5.1)
Sodium: 137 mmol/L (ref 135–145)
Total Bilirubin: 0.4 mg/dL (ref 0.3–1.2)
Total Protein: 6.4 g/dL — ABNORMAL LOW (ref 6.5–8.1)

## 2022-08-30 LAB — RESP PANEL BY RT-PCR (RSV, FLU A&B, COVID)  RVPGX2
Influenza A by PCR: NEGATIVE
Influenza B by PCR: NEGATIVE
Resp Syncytial Virus by PCR: NEGATIVE
SARS Coronavirus 2 by RT PCR: NEGATIVE

## 2022-08-30 LAB — BRAIN NATRIURETIC PEPTIDE: B Natriuretic Peptide: 58.5 pg/mL (ref 0.0–100.0)

## 2022-08-30 NOTE — Discharge Instructions (Addendum)
You were seen in the emergency department today for nasal congestion and shortness of breath.  As we discussed your lab work and chest x-ray today looked reassuring.  Your symptoms are likely related to you developing an upper respiratory infection.  These are most commonly viruses.  You did test negative for flu, COVID, and RSV, but there are many viruses that could be causing your symptoms.  We normally treat these using over-the-counter medications such as ibuprofen or Tylenol for pain/fever.  You can use over-the-counter decongestants, whether these are pills or nasal sprays.  I recommend continuing your daily allergy medication.  Continue to monitor how you're doing and return to the ER for new or worsening symptoms.

## 2022-08-30 NOTE — ED Triage Notes (Signed)
Pt states chest pressure and the feeling like "I'm going to pass out" when she ambulates.  States she went to bed early last night because she did not feel well.

## 2022-08-30 NOTE — ED Notes (Signed)
Pharmacy still at the bedside

## 2022-08-30 NOTE — ED Notes (Signed)
Ambulation with pulse oz went well. Pt maintained 100% throughout walk.

## 2022-08-30 NOTE — ED Provider Notes (Signed)
[facility] EMERGENCY DEPARTMENT Provider Note   CSN: [id number] Arrival date & time: 08/30/22  1059     History  Chief Complaint  Patient presents with   Shortness of Breath   Weakness    Teresa Poole is a 72 y.o. female history of hypertension, diabetes, chronic kidney disease, ovarian cancer, CHF (EF 20 to 60%), hyperlipidemia, CAD, who presents the emergency department complaining of shortness of breath.  Patient states that she started not feeling well last night, and went to bed early.  She woke up this morning feeling like it was harder to catch her breath, and felt congested in both her nose and her chest.  She feels as though a "10 pound bag of sugar" is laying on her chest.  Chest is not specifically hurt, but feels like pressure.  Symptoms started when she woke up this morning and was getting ready for church.  She sat back down thinking that they would go away, but they have persisted.   Shortness of Breath Weakness Associated symptoms: shortness of breath        Home Medications Prior to Admission medications   Medication Sig Start Date End Date Taking? Authorizing Provider  lisinopril (ZESTRIL) 40 MG tablet Take 40 mg by mouth daily. 06/17/22  Yes [provider]  albuterol (VENTOLIN HFA) 108 (90 Base) MCG/ACT inhaler Inhale 2 puffs into the lungs every 4 (four) hours as needed for wheezing or shortness of breath. Patient not taking: Reported on 02/28/2022 10/23/21   [provider]  carvedilol (COREG) 25 MG tablet Take 25 mg by mouth 2 (two) times daily with a meal.    [provider]  cetirizine (ZYRTEC) 10 MG tablet Take 10 mg by mouth daily as needed for allergies.    [provider]  cycloSPORINE, PF, (CEQUA) 0.09 % SOLN Place 1 drop into both eyes 2 (two) times daily. 12/18/20   [provider]  famotidine (PEPCID) 40 MG tablet Take 40 mg by mouth daily. 09/04/21   [provider]   fluticasone (FLONASE) 50 MCG/ACT nasal spray Place 2 sprays into both nostrils daily.    [provider]  furosemide (LASIX) 40 MG tablet Take 1 tablet (40 mg total) by mouth daily after breakfast. 8/65/78   Delora Fuel, MD  glucose blood (ONE TOUCH ULTRA TEST) test strip Use to check blood sugar 3 times daily Dx code E11.65 Patient taking differently: 1 each by Other route See admin instructions. Use to check blood sugar 3 times daily Dx code E11.65 11/20/14   Elayne Snare, MD  guaiFENesin (ROBITUSSIN) 100 MG/5ML liquid Take 5-10 mLs (100-200 mg total) by mouth every 6 (six) hours as needed for cough or to loosen phlegm. 07/21/22   Domenic Moras, PA-C  meclizine (ANTIVERT) 25 MG tablet Take 25 mg by mouth 3 (three) times daily as needed for dizziness.    [provider]  omeprazole (PRILOSEC) 40 MG capsule Take 40 mg by mouth daily. 03/10/22   [provider]  ONETOUCH VERIO test strip [place] Patient taking differently: 1 each by Other route in the morning, at noon, and at bedtime. 03/30/18   Elayne Snare, MD  OZEMPIC, 0.25 OR 0.5 MG/DOSE, 2 MG/1.5ML SOPN Inject 0.25 mg into the skin once a week. Sundays 05/12/21   [provider]  polyethylene glycol (MIRALAX / GLYCOLAX) 17 g packet Take 17 g by mouth daily. Patient not taking: Reported on 10/25/2021 09/28/21  Eugenie Filler, MD  rosuvastatin (CRESTOR) 20 MG tablet Take 20 mg by mouth daily.    [provider]  sucralfate (CARAFATE) 1 g tablet Take 1 tablet (1 g total) by mouth 4 (four) times daily -  with meals and at bedtime. 05/23/22   Montine Circle, PA-C      Allergies    Hydralazine hcl and Atorvastatin    Review of Systems   Review of Systems  Constitutional:  Positive for fatigue.  HENT:  Positive for congestion.   Respiratory:  Positive for shortness of breath.   Cardiovascular:        Chest pressure  Neurological:  Positive for weakness.  All other systems  reviewed and are negative.   Physical Exam Updated Vital Signs BP (!) 104/51   Pulse 65   Temp 98 F (36.7 C)   Resp 15   Ht '5\' 7"'$  (1.702 m)   Wt 75.7 kg   SpO2 100%   BMI 26.14 kg/m  Physical Exam Vitals and nursing note reviewed.  Constitutional:      Appearance: Normal appearance.  HENT:     Head: Normocephalic and atraumatic.     Nose: Congestion present.  Eyes:     Conjunctiva/sclera: Conjunctivae normal.  Cardiovascular:     Rate and Rhythm: Normal rate and regular rhythm.  Pulmonary:     Effort: Pulmonary effort is normal. No respiratory distress.     Breath sounds: Normal breath sounds.  Abdominal:     General: There is no distension.     Palpations: Abdomen is soft.     Tenderness: There is no abdominal tenderness.  Skin:    General: Skin is warm and dry.  Neurological:     General: No focal deficit present.     Mental Status: She is alert.     ED Results / Procedures / Treatments   Labs (all labs ordered are listed, but only abnormal results are displayed) Labs Reviewed  CBC - Abnormal; Notable for the following components:      Result Value   RBC 3.21 (*)    Hemoglobin 9.0 (*)    HCT 28.3 (*)    All other components within normal limits  COMPREHENSIVE METABOLIC PANEL - Abnormal; Notable for the following components:   Glucose, Bld 164 (*)    BUN 50 (*)    Creatinine, Ser 3.31 (*)    Calcium 8.8 (*)    Total Protein 6.4 (*)    Albumin 3.1 (*)    GFR, Estimated 14 (*)    All other components within normal limits  RESP PANEL BY RT-PCR (RSV, FLU A&B, COVID)  [id number]  BRAIN NATRIURETIC PEPTIDE  TROPONIN I (HIGH SENSITIVITY)  TROPONIN I (HIGH SENSITIVITY)    EKG EKG Interpretation  Date/Time:  Sunday August 30 2022 11:56:58 EST Ventricular Rate:  67 PR Interval:  176 QRS Duration: 158 QT Interval:  484 QTC Calculation: 511 R Axis:   -32 Text Interpretation: Normal sinus rhythm Left axis deviation Right bundle branch block Abnormal ECG  When compared with ECG of 01-Jul-2022 16:39, PREVIOUS ECG IS PRESENT Confirmed by Dene Gentry [phone]) on 08/30/2022 1:28:12 PM  Radiology DG Chest 2 View  Result Date: 08/30/2022 CLINICAL DATA:  Chest pressure and weakness. EXAM: CHEST - 2 VIEW COMPARISON:  07/01/2022 FINDINGS: The heart size and mediastinal contours are within normal limits. Aortic atherosclerotic calcifications. Both lungs are clear. The visualized skeletal structures are unremarkable. IMPRESSION: No active cardiopulmonary disease. Electronically Signed  By: Kerby Moors M.D.   On: 08/30/2022 13:05    Procedures Procedures    Medications Ordered in ED Medications - No data to display  ED Course/ Medical Decision Making/ A&P Clinical Course as of 08/30/22 1516  Sun Aug 30, 2022  1415 Ambulated around department, maintained 100% SpO2 on room air  [LR]    Clinical Course User Index [LR] Jaystin Mcgarvey, Cecille Aver, PA-C                           Medical Decision Making Amount and/or Complexity of Data Reviewed Labs: ordered. Radiology: ordered.   This patient is a 72 y.o. female  who presents to the ED for concern of shortness of breath and chest pressure.   Differential diagnoses prior to evaluation: The emergent differential diagnosis includes, but is not limited to,  CHF, pericardial effusion/tamponade, arrhythmias, ACS, COPD, asthma, bronchitis, pneumonia, pneumothorax, PE, anemia. This is not an exhaustive differential.   Past Medical History / Co-morbidities: hypertension, diabetes, chronic kidney disease, ovarian cancer, CHF (EF 50 to 60%), hyperlipidemia, CAD  Additional history: Chart reviewed. Pertinent results include: Patient with several prior ER visits for similar symptoms, most recently on 12/2 and 11/28.  Was discharged in stable condition both visits.  Physical Exam: Physical exam performed. The pertinent findings include: On exam patient has mild soft blood pressures, seems consistent compared to  prior.  Otherwise normal vital signs.  In no acute distress.  No increased work of breathing, lung sounds clear, normal oxygen saturation on room air.  Patient ambulated around the department and maintain normal oxygen saturations.  No leg pain or swelling.  Lab Tests/Imaging studies: I personally interpreted labs/imaging and the pertinent results include: CBC and CMP appear to be at baseline with findings consistent of chronic kidney disease.  Troponin of 17, delta troponin 16.  BNP 58.5.  Respiratory panel negative for COVID, flu, RSV.  Chest x-ray with no acute abnormalities. I agree with the radiologist interpretation.  Cardiac monitoring: EKG obtained and interpreted by my attending physician which shows: Sinus rhythm with right bundle branch block, similar compared to prior   Disposition: After consideration of the diagnostic results and the patients response to treatment, I feel that emergency department workup does not suggest an emergent condition requiring admission or immediate intervention beyond what has been performed at this time. The plan is: discharge to home with symptomatic management of likely developing viral URI. Clinically appears well, not septic. Low concern for ACS or PE based on presentation and unremarkable laboratory evaluation. Negative troponins and EKG without ischemic changes. The patient is safe for discharge and has been instructed to return immediately for worsening symptoms, change in symptoms or any other concerns.  I discussed this case with my attending physician Dr. Francia Greaves who cosigned this note including patient's presenting symptoms, physical exam, and planned diagnostics and interventions. Attending physician stated agreement with plan or made changes to plan which were implemented.   Final Clinical Impression(s) / ED Diagnoses Final diagnoses:  Shortness of breath  Nasal congestion    Rx / DC Orders ED Discharge Orders     None      Portions  of this report may have been transcribed using voice recognition software. Every effort was made to ensure accuracy; however, inadvertent computerized transcription errors may be present.    Estill Cotta 08/30/22 1516    Valarie Merino, MD 08/30/22 1615

## 2022-08-30 NOTE — ED Provider Triage Note (Signed)
Emergency Medicine Provider Triage Evaluation Note  Teresa Poole , a 72 y.o. female  was evaluated in triage.  Pt complains of feeling as though she may pass out when she is ambulating since this morning.  States she feels short of breath and like she stumbles when she walks.  Notes some weakness in her legs that she thinks may be causing this.  Denies fever, recent URI symptoms.  Hx of non-insulin DMT2 with CKD.  Also known Hx of CHF.  Review of Systems  Positive:  Negative: See above  Physical Exam  BP (!) 108/53 (BP Location: Right Arm)   Pulse 62   Temp 98.4 F (36.9 C)   Resp 16   Ht '5\' 7"'$  (1.702 m)   Wt 75.7 kg   SpO2 99%   BMI 26.14 kg/m  Gen:   Awake, no distress   Resp:  Normal effort, chest nonTTP MSK:   Moves extremities without difficulty  Other:  Radial and PT pulses 2+ bilaterally.  Does not appear pale or diaphoretic.  Sitting comfortably.  Communicating without difficulty.  Medical Decision Making  Medically screening exam initiated at 12:32 PM.  Appropriate orders placed.  Teresa Poole was informed that the remainder of the evaluation will be completed by another provider, this initial triage assessment does not replace that evaluation, and the importance of remaining in the ED until their evaluation is complete.  Recent Creatinine from 2-3 weeks ago 4.17 noted.   Prince Rome, PA-C 68/34/19 1239

## 2022-09-20 ENCOUNTER — Emergency Department (HOSPITAL_COMMUNITY)
Admission: EM | Admit: 2022-09-20 | Discharge: 2022-09-21 | Discharge status: Against Medical Advice | Payer: Medicare PPO | Attending: Emergency Medicine | Admitting: Emergency Medicine

## 2022-09-20 ENCOUNTER — Emergency Department (HOSPITAL_COMMUNITY): Payer: Medicare PPO

## 2022-09-20 ENCOUNTER — Encounter (HOSPITAL_COMMUNITY): Payer: Self-pay | Admitting: Emergency Medicine

## 2022-09-20 ENCOUNTER — Emergency Department (HOSPITAL_COMMUNITY)
Admission: EM | Admit: 2022-09-20 | Discharge: 2022-09-20 | Discharge status: Against Medical Advice | Payer: Medicare PPO

## 2022-09-20 ENCOUNTER — Other Ambulatory Visit: Payer: Self-pay

## 2022-09-20 DIAGNOSIS — Z1152 Encounter for screening for COVID-19: Secondary | ICD-10-CM | POA: Diagnosis not present

## 2022-09-20 DIAGNOSIS — J3489 Other specified disorders of nose and nasal sinuses: Secondary | ICD-10-CM | POA: Diagnosis not present

## 2022-09-20 DIAGNOSIS — M6281 Muscle weakness (generalized): Secondary | ICD-10-CM | POA: Diagnosis not present

## 2022-09-20 DIAGNOSIS — R0989 Other specified symptoms and signs involving the circulatory and respiratory systems: Secondary | ICD-10-CM | POA: Diagnosis not present

## 2022-09-20 DIAGNOSIS — Z5321 Procedure and treatment not carried out due to patient leaving prior to being seen by health care provider: Secondary | ICD-10-CM | POA: Insufficient documentation

## 2022-09-20 LAB — CBC
HCT: 26.4 % — ABNORMAL LOW (ref 36.0–46.0)
Hemoglobin: 8.5 g/dL — ABNORMAL LOW (ref 12.0–15.0)
MCH: 28.3 pg (ref 26.0–34.0)
MCHC: 32.2 g/dL (ref 30.0–36.0)
MCV: 88 fL (ref 80.0–100.0)
Platelets: 250 10*3/uL (ref 150–400)
RBC: 3 MIL/uL — ABNORMAL LOW (ref 3.87–5.11)
RDW: 14.3 % (ref 11.5–15.5)
WBC: 6.3 10*3/uL (ref 4.0–10.5)
nRBC: 0 % (ref 0.0–0.2)

## 2022-09-20 LAB — URINALYSIS, ROUTINE W REFLEX MICROSCOPIC
Bacteria, UA: NONE SEEN
Bilirubin Urine: NEGATIVE
Glucose, UA: NEGATIVE mg/dL
Hgb urine dipstick: NEGATIVE
Ketones, ur: NEGATIVE mg/dL
Nitrite: NEGATIVE
Protein, ur: 100 mg/dL — AB
Specific Gravity, Urine: 1.015 (ref 1.005–1.030)
pH: 5 (ref 5.0–8.0)

## 2022-09-20 LAB — RESP PANEL BY RT-PCR (RSV, FLU A&B, COVID)  RVPGX2
Influenza A by PCR: NEGATIVE
Influenza B by PCR: NEGATIVE
Resp Syncytial Virus by PCR: NEGATIVE
SARS Coronavirus 2 by RT PCR: NEGATIVE

## 2022-09-20 LAB — BASIC METABOLIC PANEL
Anion gap: 14 (ref 5–15)
BUN: 57 mg/dL — ABNORMAL HIGH (ref 8–23)
CO2: 18 mmol/L — ABNORMAL LOW (ref 22–32)
Calcium: 8.6 mg/dL — ABNORMAL LOW (ref 8.9–10.3)
Chloride: 106 mmol/L (ref 98–111)
Creatinine, Ser: 3.6 mg/dL — ABNORMAL HIGH (ref 0.44–1.00)
GFR, Estimated: 13 mL/min — ABNORMAL LOW (ref >60)
Glucose, Bld: 132 mg/dL — ABNORMAL HIGH (ref 70–99)
Potassium: 4.3 mmol/L (ref 3.5–5.1)
Sodium: 138 mmol/L (ref 135–145)

## 2022-09-20 LAB — TROPONIN I (HIGH SENSITIVITY): Troponin I (High Sensitivity): 15 ng/L (ref <18)

## 2022-09-20 LAB — CBG MONITORING, ED: Glucose-Capillary: 114 mg/dL — ABNORMAL HIGH (ref 70–99)

## 2022-09-20 NOTE — ED Notes (Signed)
Patient has decided to leave. She wanted to get some food. I advised her to stay to be seen. She will return should symptoms persist.

## 2022-09-20 NOTE — ED Triage Notes (Signed)
Pt complains of leg weakness, sinus pressure on left side of face and chest congestions x 1 day. Pt states feels like an ice pick poking face. Pt feels unbalanced on feet when walking.

## 2022-09-21 LAB — TROPONIN I (HIGH SENSITIVITY): Troponin I (High Sensitivity): 13 ng/L (ref <18)

## 2022-09-21 NOTE — ED Notes (Signed)
The patient did not answer for updated vitals x3

## 2022-09-21 NOTE — ED Notes (Signed)
The patient did not respond to updated vitals call

## 2022-10-10 ENCOUNTER — Emergency Department (HOSPITAL_COMMUNITY)
Admission: EM | Admit: 2022-10-10 | Discharge: 2022-10-10 | Discharge status: Against Medical Advice | Payer: Medicare PPO | Attending: Emergency Medicine | Admitting: Emergency Medicine

## 2022-10-10 ENCOUNTER — Encounter (HOSPITAL_COMMUNITY): Payer: Self-pay | Admitting: *Deleted

## 2022-10-10 ENCOUNTER — Other Ambulatory Visit: Payer: Self-pay

## 2022-10-10 ENCOUNTER — Emergency Department (HOSPITAL_COMMUNITY): Payer: Medicare PPO

## 2022-10-10 DIAGNOSIS — M542 Cervicalgia: Secondary | ICD-10-CM | POA: Diagnosis not present

## 2022-10-10 DIAGNOSIS — N189 Chronic kidney disease, unspecified: Secondary | ICD-10-CM | POA: Diagnosis not present

## 2022-10-10 DIAGNOSIS — E041 Nontoxic single thyroid nodule: Secondary | ICD-10-CM | POA: Diagnosis not present

## 2022-10-10 DIAGNOSIS — M79602 Pain in left arm: Secondary | ICD-10-CM | POA: Diagnosis not present

## 2022-10-10 DIAGNOSIS — R079 Chest pain, unspecified: Secondary | ICD-10-CM | POA: Diagnosis not present

## 2022-10-10 DIAGNOSIS — N289 Disorder of kidney and ureter, unspecified: Secondary | ICD-10-CM

## 2022-10-10 DIAGNOSIS — R519 Headache, unspecified: Secondary | ICD-10-CM | POA: Insufficient documentation

## 2022-10-10 LAB — BASIC METABOLIC PANEL
Anion gap: 11 (ref 5–15)
BUN: 51 mg/dL — ABNORMAL HIGH (ref 8–23)
CO2: 22 mmol/L (ref 22–32)
Calcium: 8.3 mg/dL — ABNORMAL LOW (ref 8.9–10.3)
Chloride: 107 mmol/L (ref 98–111)
Creatinine, Ser: 3.66 mg/dL — ABNORMAL HIGH (ref 0.44–1.00)
GFR, Estimated: 13 mL/min — ABNORMAL LOW (ref >60)
Glucose, Bld: 146 mg/dL — ABNORMAL HIGH (ref 70–99)
Potassium: 4.4 mmol/L (ref 3.5–5.1)
Sodium: 140 mmol/L (ref 135–145)

## 2022-10-10 LAB — CBC
HCT: 26.6 % — ABNORMAL LOW (ref 36.0–46.0)
Hemoglobin: 8.4 g/dL — ABNORMAL LOW (ref 12.0–15.0)
MCH: 28.3 pg (ref 26.0–34.0)
MCHC: 31.6 g/dL (ref 30.0–36.0)
MCV: 89.6 fL (ref 80.0–100.0)
Platelets: 252 10*3/uL (ref 150–400)
RBC: 2.97 MIL/uL — ABNORMAL LOW (ref 3.87–5.11)
RDW: 13.6 % (ref 11.5–15.5)
WBC: 6.4 10*3/uL (ref 4.0–10.5)
nRBC: 0 % (ref 0.0–0.2)

## 2022-10-10 LAB — TROPONIN I (HIGH SENSITIVITY)
Troponin I (High Sensitivity): 22 ng/L — ABNORMAL HIGH (ref <18)
Troponin I (High Sensitivity): 20 ng/L — ABNORMAL HIGH (ref <18)

## 2022-10-10 NOTE — ED Triage Notes (Signed)
There is also has a loud ringing in her lt ear

## 2022-10-10 NOTE — ED Notes (Signed)
Patient no longer visualized in the department. ED Provider notified.

## 2022-10-10 NOTE — ED Triage Notes (Signed)
The pt has had a noise in her l lt ear with pressure and congestion in the lt side of her head  and down the lt side of her face  all day

## 2022-10-10 NOTE — ED Notes (Signed)
Patient and RN previously discussed AMA process and risks associated with leaving prior to completion of work up. Patient verbalized understanding of same at the time and then decided to stay for scan.

## 2022-10-10 NOTE — ED Notes (Signed)
Pt leaving AMA, ambulatory out the ED.

## 2022-10-10 NOTE — ED Notes (Signed)
Patient reports that she doesn't understand why we scanned her head and neck when she was complaining about her lungs. RN read the patient her triage note at which point she agreed and said she understands. Patient also unclear on how long she has been waiting for results.

## 2022-10-10 NOTE — ED Notes (Addendum)
The patient to be brought to bed assignment by Xray

## 2022-10-10 NOTE — ED Notes (Signed)
Patient approached desk concerned about wait for CT scans. RN informed patient that we did not anticipate it being 6-8 hours, as she was concerned and she was satisfied. Patient currently ambulatory, in no distress, resting on the bed.

## 2022-10-10 NOTE — ED Provider Notes (Signed)
[facility] Provider Note   CSN: [id number] Arrival date & time: 10/10/22  1529     History  Chief Complaint  Patient presents with   Headache    Teresa Poole is a 72 y.o. female history of CKD, here presenting with ringing in the left ear as well as chest pain and left arm pain.  Patient states that yesterday she was driving and noticed ringing in the left ear.  Patient also noticed swelling of the left neck area.  Patient has subjective trouble swallowing.  Patient also has pain down the left arm and was concerned about her heart.  Patient has no history of CAD.  She has CKD but not on dialysis.  The history is provided by the patient.       Home Medications Prior to Admission medications   Medication Sig Start Date End Date Taking? Authorizing Provider  carvedilol (COREG) 25 MG tablet Take 25 mg by mouth 2 (two) times daily with a meal.    [provider]  cetirizine (ZYRTEC) 10 MG tablet Take 10 mg by mouth daily as needed for allergies.    [provider]  famotidine (PEPCID) 40 MG tablet Take 40 mg by mouth daily. 09/04/21   [provider]  fluticasone (FLONASE) 50 MCG/ACT nasal spray Place 2 sprays into both nostrils daily.    [provider]  furosemide (LASIX) 40 MG tablet Take 1 tablet (40 mg total) by mouth daily after breakfast. [id number]   Delora Fuel, MD  glucose blood (ONE TOUCH ULTRA TEST) test strip Use to check blood sugar 3 times daily Dx code E11.65 Patient taking differently: 1 each by Other route See admin instructions. Use to check blood sugar 3 times daily Dx code E11.65 11/20/14   Elayne Snare, MD  guaiFENesin (ROBITUSSIN) 100 MG/5ML liquid Take 5-10 mLs (100-200 mg total) by mouth every 6 (six) hours as needed for cough or to loosen phlegm. Patient not taking: Reported on 08/30/2022 07/21/22   Domenic Moras, PA-C  lisinopril (ZESTRIL) 40 MG tablet Take 40 mg by mouth daily.  06/17/22   [provider]  omeprazole (PRILOSEC) 40 MG capsule Take 40 mg by mouth daily. 03/10/22   [provider]  ONETOUCH VERIO test strip [place] Patient taking differently: 1 each by Other route in the morning, at noon, and at bedtime. 03/30/18   Elayne Snare, MD  OZEMPIC, 0.25 OR 0.5 MG/DOSE, 2 MG/1.5ML SOPN Inject 0.25 mg into the skin once a week. Sundays 05/12/21   [provider]  polyethylene glycol (MIRALAX / GLYCOLAX) 17 g packet Take 17 g by mouth daily. Patient not taking: Reported on 10/25/2021 09/28/21   Eugenie Filler, MD  rosuvastatin (CRESTOR) 20 MG tablet Take 20 mg by mouth daily.    [provider]  sucralfate (CARAFATE) 1 g tablet Take 1 tablet (1 g total) by mouth 4 (four) times daily -  with meals and at bedtime. 05/23/22   Montine Circle, PA-C      Allergies    Hydralazine hcl and Atorvastatin    Review of Systems   Review of Systems  Neurological:  Positive for headaches.  All other systems reviewed and are negative.   Physical Exam Updated Vital Signs BP (!) 157/72 (BP Location: Right Arm)   Pulse 75   Temp 98.6 F (37 C) (Oral)   Resp 17   Ht 5' 7"$  (1.702 m)  Wt 75 kg   SpO2 94%   BMI 25.90 kg/m  Physical Exam Vitals and nursing note reviewed.  Constitutional:      Comments: Well-appearing  HENT:     Head: Normocephalic.     Comments: No obvious otitis media bilaterally    Mouth/Throat:     Mouth: Mucous membranes are moist.     Comments: No obvious peritonsillar abscess. Eyes:     Extraocular Movements: Extraocular movements intact.     Pupils: Pupils are equal, round, and reactive to light.  Neck:     Comments: Patient has left cervical lymphadenopathy. Cardiovascular:     Rate and Rhythm: Normal rate and regular rhythm.  Pulmonary:     Effort: Pulmonary effort is normal.     Breath sounds: Normal breath sounds.  Musculoskeletal:     Cervical back: Normal range of  motion.  Neurological:     Mental Status: She is alert and oriented to person, place, and time.  Psychiatric:        Mood and Affect: Mood normal.        Behavior: Behavior normal.     ED Results / Procedures / Treatments   Labs (all labs ordered are listed, but only abnormal results are displayed) Labs Reviewed  BASIC METABOLIC PANEL - Abnormal; Notable for the following components:      Result Value   Glucose, Bld 146 (*)    BUN 51 (*)    Creatinine, Ser 3.66 (*)    Calcium 8.3 (*)    GFR, Estimated 13 (*)    All other components within normal limits  CBC - Abnormal; Notable for the following components:   RBC 2.97 (*)    Hemoglobin 8.4 (*)    HCT 26.6 (*)    All other components within normal limits  TROPONIN I (HIGH SENSITIVITY) - Abnormal; Notable for the following components:   Troponin I (High Sensitivity) 22 (*)    All other components within normal limits  TROPONIN I (HIGH SENSITIVITY)    EKG EKG Interpretation  Date/Time:  Saturday October 10 2022 15:31:18 EST Ventricular Rate:  76 PR Interval:  172 QRS Duration: 132 QT Interval:  440 QTC Calculation: 495 R Axis:   -30 Text Interpretation: Normal sinus rhythm Possible Left atrial enlargement Left axis deviation Right bundle branch block Minimal voltage criteria for LVH, may be normal variant ( R in aVL ) Septal infarct , age undetermined Abnormal ECG When compared with ECG of 20-Sep-2022 21:51, PREVIOUS ECG IS PRESENT Confirmed by Wandra Arthurs [id number]) on 10/10/2022 4:27:14 PM  Radiology DG Chest 2 View  Result Date: 10/10/2022 CLINICAL DATA:  Upper chest pain.  Chest congestion. EXAM: CHEST - 2 VIEW COMPARISON:  09/20/2022 and older exams. FINDINGS: Cardiac silhouette normal in size. Normal mediastinal and hilar contours. Aortic atherosclerotic calcifications. Clear lungs.  No pleural effusion or pneumothorax. Skeletal structures are intact. IMPRESSION: No active cardiopulmonary disease. Electronically Signed    By: Lajean Manes M.D.   On: 10/10/2022 16:42    Procedures Procedures    Medications Ordered in ED Medications - No data to display  ED Course/ Medical Decision Making/ A&P                             Medical Decision Making Teresa Poole is a 72 y.o. female here presenting with headache and neck pain.  Patient has baseline CKD with a creatinine of 3.6.  Patient has some left sided cervical lymphadenopathy.  I do not see any obvious peritonsillar abscess.  Will get a CT neck to rule out a mass.  She has no evidence of strep pharyngitis or otitis media.  Will also check CBC and CMP.  8:01 PM I reviewed patient's labs her creatinine is baseline at 3.66.  Initial troponin is 22.  Delta troponin is pending.  CT neck showed thyroid nodule.  Patient unfortunately left before results and signed out against medical advice   Problems Addressed: Renal insufficiency: acute illness or injury Thyroid nodule: acute illness or injury  Amount and/or Complexity of Data Reviewed Labs: ordered. Decision-making details documented in ED Course. Radiology: ordered and independent interpretation performed. Decision-making details documented in ED Course.    Final Clinical Impression(s) / ED Diagnoses Final diagnoses:  None    Rx / DC Orders ED Discharge Orders     None         Drenda Freeze, MD 10/10/22 2003

## 2022-10-10 NOTE — ED Notes (Signed)
Called lab to draw pt's repeat trop

## 2022-10-18 ENCOUNTER — Emergency Department (HOSPITAL_COMMUNITY): Payer: Medicare PPO

## 2022-10-18 ENCOUNTER — Emergency Department (HOSPITAL_COMMUNITY)
Admission: EM | Admit: 2022-10-18 | Discharge: 2022-10-19 | Disposition: A | Discharge status: Home / Self Care | Payer: Medicare PPO | Attending: Student | Admitting: Student

## 2022-10-18 ENCOUNTER — Other Ambulatory Visit: Payer: Self-pay

## 2022-10-18 DIAGNOSIS — Z8616 Personal history of COVID-19: Secondary | ICD-10-CM | POA: Diagnosis not present

## 2022-10-18 DIAGNOSIS — Z79899 Other long term (current) drug therapy: Secondary | ICD-10-CM | POA: Insufficient documentation

## 2022-10-18 DIAGNOSIS — E1122 Type 2 diabetes mellitus with diabetic chronic kidney disease: Secondary | ICD-10-CM | POA: Diagnosis not present

## 2022-10-18 DIAGNOSIS — I5042 Chronic combined systolic (congestive) and diastolic (congestive) heart failure: Secondary | ICD-10-CM | POA: Diagnosis not present

## 2022-10-18 DIAGNOSIS — R519 Headache, unspecified: Secondary | ICD-10-CM | POA: Diagnosis not present

## 2022-10-18 DIAGNOSIS — R0789 Other chest pain: Secondary | ICD-10-CM | POA: Insufficient documentation

## 2022-10-18 DIAGNOSIS — R42 Dizziness and giddiness: Secondary | ICD-10-CM | POA: Insufficient documentation

## 2022-10-18 DIAGNOSIS — I2510 Atherosclerotic heart disease of native coronary artery without angina pectoris: Secondary | ICD-10-CM | POA: Diagnosis not present

## 2022-10-18 DIAGNOSIS — N184 Chronic kidney disease, stage 4 (severe): Secondary | ICD-10-CM | POA: Insufficient documentation

## 2022-10-18 DIAGNOSIS — I130 Hypertensive heart and chronic kidney disease with heart failure and stage 1 through stage 4 chronic kidney disease, or unspecified chronic kidney disease: Secondary | ICD-10-CM | POA: Insufficient documentation

## 2022-10-18 DIAGNOSIS — I160 Hypertensive urgency: Secondary | ICD-10-CM

## 2022-10-18 LAB — CBC
HCT: 28.9 % — ABNORMAL LOW (ref 36.0–46.0)
Hemoglobin: 9.2 g/dL — ABNORMAL LOW (ref 12.0–15.0)
MCH: 28.2 pg (ref 26.0–34.0)
MCHC: 31.8 g/dL (ref 30.0–36.0)
MCV: 88.7 fL (ref 80.0–100.0)
Platelets: 310 10*3/uL (ref 150–400)
RBC: 3.26 MIL/uL — ABNORMAL LOW (ref 3.87–5.11)
RDW: 14 % (ref 11.5–15.5)
WBC: 6.7 10*3/uL (ref 4.0–10.5)
nRBC: 0 % (ref 0.0–0.2)

## 2022-10-18 LAB — BASIC METABOLIC PANEL
Anion gap: 10 (ref 5–15)
BUN: 51 mg/dL — ABNORMAL HIGH (ref 8–23)
CO2: 23 mmol/L (ref 22–32)
Calcium: 8.7 mg/dL — ABNORMAL LOW (ref 8.9–10.3)
Chloride: 109 mmol/L (ref 98–111)
Creatinine, Ser: 3.79 mg/dL — ABNORMAL HIGH (ref 0.44–1.00)
GFR, Estimated: 12 mL/min — ABNORMAL LOW (ref >60)
Glucose, Bld: 135 mg/dL — ABNORMAL HIGH (ref 70–99)
Potassium: 3.9 mmol/L (ref 3.5–5.1)
Sodium: 142 mmol/L (ref 135–145)

## 2022-10-18 LAB — TROPONIN I (HIGH SENSITIVITY): Troponin I (High Sensitivity): 41 ng/L — ABNORMAL HIGH (ref <18)

## 2022-10-18 IMAGING — DX DG CHEST 1V PORT
1 series · 1 of 1 positions shown · non-contrast
Comparison: 10/25/2021 and prior studies

CLINICAL DATA: Chest pain.

EXAM:
PORTABLE CHEST 1 VIEW

[chest]
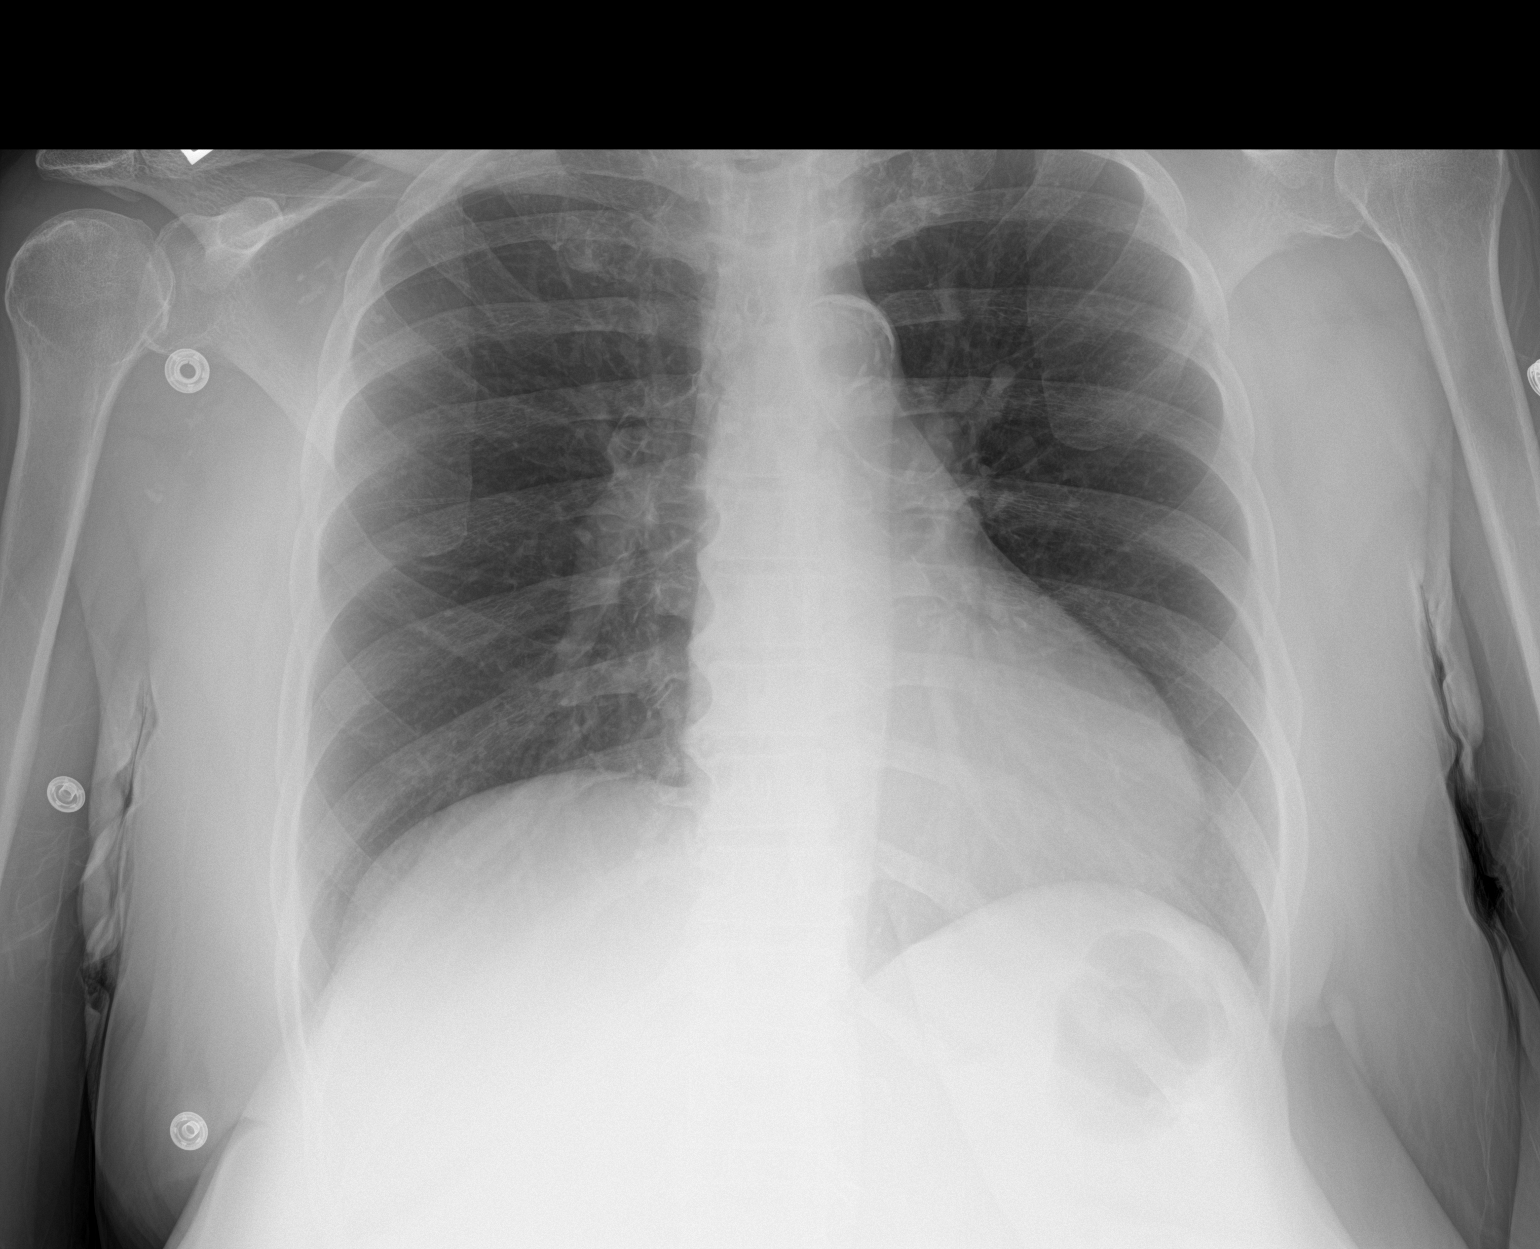

[1 of 1 positions shown; findings below may reference images not displayed]

FINDINGS: The cardiomediastinal silhouette is unremarkable.

Elevation of the RIGHT hemidiaphragm again noted.

There is no evidence of focal airspace disease, pulmonary edema,
suspicious pulmonary nodule/mass, pleural effusion, or pneumothorax.

No acute bony abnormalities are identified.
IMPRESSION: No active disease.

## 2022-10-18 MED ORDER — DIPHENHYDRAMINE HCL 50 MG/ML IJ SOLN
25.0000 mg | Freq: Once | INTRAMUSCULAR | Status: AC
  Administered 2022-10-18: 25 mg via INTRAVENOUS
  Filled 2022-10-18: qty 1

## 2022-10-18 MED ORDER — PROCHLORPERAZINE EDISYLATE 10 MG/2ML IJ SOLN
10.0000 mg | Freq: Once | INTRAMUSCULAR | Status: AC
  Administered 2022-10-18: 10 mg via INTRAVENOUS
  Filled 2022-10-18: qty 2

## 2022-10-18 MED ORDER — LABETALOL HCL 5 MG/ML IV SOLN
5.0000 mg | Freq: Once | INTRAVENOUS | Status: AC
  Administered 2022-10-18: 5 mg via INTRAVENOUS
  Filled 2022-10-18: qty 4

## 2022-10-18 NOTE — ED Triage Notes (Signed)
Pt c/o centralized chest pain described as tightness/heaviness while sitting at church around 10:30 am, resolved on it own. CP returns around 9:30 pm. CP associated with dizziness and chills. No cough. No SOB. No sick exposure.   Triage BP 219/101 (136). Hx HTN. Forgot to take her BP medications today.

## 2022-10-18 NOTE — ED Notes (Signed)
Attempted IV x1. No success. Labs collected and sent

## 2022-10-18 NOTE — ED Provider Notes (Incomplete)
[place] Provider Note  CSN: [id number] [place] date & time: 10/18/22 2122  Chief Complaint(s) Chest Pain  HPI Teresa Poole is a 72 y.o. female ***   Past Medical History Past Medical History:  Diagnosis Date   Allergy    Anemia    Carotid stenosis    Chronic combined systolic and diastolic CHF (congestive heart failure) ([place])    CKD (chronic kidney disease), stage IV ([place]) 09/24/2013   Pt at [facility], Dr. Joelyn Oms   COVID-19 10/24/2021   Diabetes mellitus type 2 in nonobese [facility])    Edema 09/14/2013   GERD (gastroesophageal reflux disease)    History of ovarian cancer    Hyperlipidemia    Hypertension    Mild CAD    non-obstructive by LHC (09/25/2013): Proximal and mid LAD serial 20%, proximal circumflex 30%, mid AV groove circumflex 30%, mid RCA mild plaque.   NICM (nonischemic cardiomyopathy) (HCC)    Obesity (BMI 30-39.9)    RBBB    Renal artery stenosis ([place]) 10/11/2018   Thyroid disease    Seen by specialist   Patient Active Problem List   Diagnosis Date Noted   Dizziness 04/17/2022   (HFpEF) heart failure with preserved ejection fraction ([place]) 02/28/2022   Hypomagnesemia 02/28/2022   Manic episode, unspecified ([place])    COVID-19 virus infection 10/25/2021   UTI (urinary tract infection) 10/25/2021   Anemia 10/25/2021   Suicidal ideations 10/17/2021   Acute renal failure superimposed on stage 4 chronic kidney disease ([place]) 10/14/2021   ARF (acute renal failure) ([place]) 10/14/2021   Small bowel obstruction (HCC)    SBO (small bowel obstruction) ([place]) 09/22/2021   Adjustment disorder with anxiety 06/10/2021   Atypical chest pain 11/27/2018   Renal artery stenosis ([place]) 10/11/2018   Hyperlipidemia associated with type 2 diabetes mellitus ([place]) 10/26/2013   Chronic combined systolic and diastolic heart failure ([place]) 09/28/2013   Anemia of chronic kidney failure, stage 4 (severe) ([place]) 09/24/2013    Cardiomyopathy, hypertensive ([place]) 09/18/2013   Edema 09/14/2013   Acute combined systolic and diastolic congestive heart failure ([place]) 09/09/2013   Hypertensive heart disease with CHF (congestive heart failure) ([place]) 09/09/2013   Hypertension associated with diabetes ([place]) 09/07/2013   Type 2 diabetes mellitus with chronic kidney disease, without long-term current use of insulin ([place]) 09/07/2013   Obesity (BMI 30-39.9)    History of ovarian cancer    Home Medication(s) Prior to Admission medications   Medication Sig Start Date End Date Taking? Authorizing Provider  carvedilol (COREG) 25 MG tablet Take 25 mg by mouth 2 (two) times daily with a meal.    [provider]  cetirizine (ZYRTEC) 10 MG tablet Take 10 mg by mouth daily as needed for allergies.    [provider]  famotidine (PEPCID) 40 MG tablet Take 40 mg by mouth daily. 09/04/21   [provider]  fluticasone (FLONASE) 50 MCG/ACT nasal spray Place 2 sprays into both nostrils daily.    [provider]  furosemide (LASIX) 40 MG tablet Take 1 tablet (40 mg total) by mouth daily after breakfast. [id number]   Delora Fuel, MD  glucose blood (ONE TOUCH ULTRA TEST) test strip Use to check blood sugar 3 times daily Dx code E11.65 Patient taking differently: 1 each by Other route See admin instructions. Use to check blood sugar 3 times daily Dx code E11.65 11/20/14   Elayne Snare, MD  guaiFENesin (ROBITUSSIN) 100 MG/5ML liquid Take 5-10 mLs (  100-200 mg total) by mouth every 6 (six) hours as needed for cough or to loosen phlegm. Patient not taking: Reported on 08/30/2022 07/21/22   Domenic Moras, PA-C  lisinopril (ZESTRIL) 40 MG tablet Take 40 mg by mouth daily. 06/17/22   [provider]  omeprazole (PRILOSEC) 40 MG capsule Take 40 mg by mouth daily. 03/10/22   [provider]  ONETOUCH VERIO test strip [place] Patient taking differently: 1 each by Other route in the  morning, at noon, and at bedtime. 03/30/18   Elayne Snare, MD  OZEMPIC, 0.25 OR 0.5 MG/DOSE, 2 MG/1.5ML SOPN Inject 0.25 mg into the skin once a week. Sundays 05/12/21   [provider]  polyethylene glycol (MIRALAX / GLYCOLAX) 17 g packet Take 17 g by mouth daily. Patient not taking: Reported on 10/25/2021 09/28/21   Eugenie Filler, MD  rosuvastatin (CRESTOR) 20 MG tablet Take 20 mg by mouth daily.    [provider]  sucralfate (CARAFATE) 1 g tablet Take 1 tablet (1 g total) by mouth 4 (four) times daily -  with meals and at bedtime. 05/23/22   Montine Circle, PA-C                                                                                                                                    Past Surgical History Past Surgical History:  Procedure Laterality Date   LEFT HEART CATHETERIZATION WITH CORONARY ANGIOGRAM N/A 09/25/2013   Procedure: LEFT HEART CATHETERIZATION WITH CORONARY ANGIOGRAM;  Surgeon: Burnell Blanks, MD;  Location: [facility] CATH LAB;  Service: Cardiovascular;  Laterality: N/A;   RENAL ANGIOGRAPHY N/A 10/06/2018   Procedure: RENAL ANGIOGRAPHY;  Surgeon: Marty Heck, MD;  Location: [facility] CV LAB;  Service: Cardiovascular;  Laterality: N/A;   Family History Family History  Problem Relation Age of Onset   Diabetes Father    Hypertension Father    Hypertension Maternal Grandfather     Social History Social History   Tobacco Use   Smoking status: Never   Smokeless tobacco: Never  Vaping Use   Vaping Use: Never used  Substance Use Topics   Alcohol use: No   Drug use: No   Allergies Hydralazine hcl and Atorvastatin  Review of Systems Review of Systems *** Physical Exam Vital Signs  I have reviewed the triage vital signs BP (!) 219/101 (BP Location: Left Arm)   Pulse 88   Temp 98.2 F (36.8 C) (Oral)   Resp 20   SpO2 98%  *** Physical Exam  ED Results and Treatments Labs (all labs ordered are listed, but only abnormal  results are displayed) Labs Reviewed  BASIC METABOLIC PANEL - Abnormal; Notable for the following components:      Result Value   Glucose, Bld 135 (*)    BUN 51 (*)    Creatinine, Ser 3.79 (*)    Calcium 8.7 (*)  GFR, Estimated 12 (*)    All other components within normal limits  CBC - Abnormal; Notable for the following components:   RBC 3.26 (*)    Hemoglobin 9.2 (*)    HCT 28.9 (*)    All other components within normal limits  TROPONIN I (HIGH SENSITIVITY) - Abnormal; Notable for the following components:   Troponin I (High Sensitivity) 41 (*)    All other components within normal limits  TSH  TROPONIN I (HIGH SENSITIVITY)                                                                                                                          Radiology DG Chest 2 View  Result Date: 10/18/2022 CLINICAL DATA:  Chest pain and chills. EXAM: CHEST - 2 VIEW COMPARISON:  PA Lat 10/10/2022. FINDINGS: The heart size and mediastinal contours are within normal limits. Thoracic aorta is heavily calcified. Both lungs are clear. The visualized skeletal structures are intact, with thoracic spondylosis. IMPRESSION: No evidence of acute chest disease.  Aortic atherosclerosis. Electronically Signed   By: Telford Nab M.D.   On: 10/18/2022 22:17    Pertinent labs & imaging results that were available during my care of the patient were reviewed by me and considered in my medical decision making (see MDM for details).  Medications Ordered in ED Medications - No data to display                                                                                                                                   Procedures Procedures  (including critical care time)  Medical Decision Making / ED Course   This patient presents to the ED for concern of ***, this involves an extensive number of treatment options, and is a complaint that carries with it a high risk of complications and morbidity.  The  differential diagnosis includes ***  MDM: ***   Additional history obtained: -Additional history obtained from *** -External records from outside source obtained and reviewed including: Chart review including previous notes, labs, imaging, consultation notes   Lab Tests: -I ordered, reviewed, and interpreted labs.   The pertinent results include:   Labs Reviewed  BASIC METABOLIC PANEL - Abnormal; Notable for the following components:      Result Value   Glucose, Bld 135 (*)    BUN 51 (*)    Creatinine, Ser 3.79 (*)  Calcium 8.7 (*)    GFR, Estimated 12 (*)    All other components within normal limits  CBC - Abnormal; Notable for the following components:   RBC 3.26 (*)    Hemoglobin 9.2 (*)    HCT 28.9 (*)    All other components within normal limits  TROPONIN I (HIGH SENSITIVITY) - Abnormal; Notable for the following components:   Troponin I (High Sensitivity) 41 (*)    All other components within normal limits  TSH  TROPONIN I (HIGH SENSITIVITY)      EKG ***  EKG Interpretation  Date/Time:  Sunday October 18 2022 21:43:51 EST Ventricular Rate:  86 PR Interval:  187 QRS Duration: 148 QT Interval:  408 QTC Calculation: 488 R Axis:   -11 Text Interpretation: Sinus rhythm Probable left atrial enlargement Right bundle branch block Left ventricular hypertrophy Borderline prolonged QT interval Baseline wander in lead(s) II V1 V4 No significant change since last tracing Confirmed by Leanord Asal ([id number]) on 10/18/2022 10:24:28 PM         Imaging Studies ordered: I ordered imaging studies including *** I independently visualized and interpreted imaging. I agree with the radiologist interpretation   Medicines ordered and prescription drug management: No orders of the defined types were placed in this encounter.   -I have reviewed the patients home medicines and have made adjustments as needed  Critical interventions ***  Consultations Obtained: I  requested consultation with the ***,  and discussed lab and imaging findings as well as pertinent plan - they recommend: ***   Cardiac Monitoring: The patient was maintained on a cardiac monitor.  I personally viewed and interpreted the cardiac monitored which showed an underlying rhythm of: ***  Social Determinants of Health:  Factors impacting patients care include: ***   Reevaluation: After the interventions noted above, I reevaluated the patient and found that they have :{resolved/improved/worsened:23923::"improved"}  Co morbidities that complicate the patient evaluation  Past Medical History:  Diagnosis Date   Allergy    Anemia    Carotid stenosis    Chronic combined systolic and diastolic CHF (congestive heart failure) ([place])    CKD (chronic kidney disease), stage IV ([place]) 09/24/2013   Pt at [place], Dr. Joelyn Oms   COVID-19 10/24/2021   Diabetes mellitus type 2 in nonobese [facility])    Edema 09/14/2013   GERD (gastroesophageal reflux disease)    History of ovarian cancer    Hyperlipidemia    Hypertension    Mild CAD    non-obstructive by LHC (09/25/2013): Proximal and mid LAD serial 20%, proximal circumflex 30%, mid AV groove circumflex 30%, mid RCA mild plaque.   NICM (nonischemic cardiomyopathy) (HCC)    Obesity (BMI 30-39.9)    RBBB    Renal artery stenosis ([place]) 10/11/2018   Thyroid disease    Seen by specialist      Dispostion: I considered admission for this patient, ***     Final Clinical Impression(s) / ED Diagnoses Final diagnoses:  None     '@PCDICTATION'$ @

## 2022-10-18 NOTE — ED Notes (Signed)
Discussed s/s and elevated BP with PIT PA Abigail. No new orders.

## 2022-10-18 NOTE — ED Provider Notes (Signed)
[facility] Provider Note  CSN: [id number] Arrival date & time: 10/18/22 2122  Chief Complaint(s) Chest Pain  HPI Teresa Poole is a 72 y.o. female with PMH CKD, T2DM, HTN, HLD, CAD, nonischemic cardiomyopathy with EF around 50%, renal artery stenosis who presents emergency room for evaluation of multiple complaints including chest pain, headache, dizziness and hypertension.  Patient states that she was driving home this morning from church when she had a sudden onset episode of a "whooshing" that started in her chest and radiated up into her head.  She endorses associated tendinitis and hearing loss in the left ear.  Does endorse associated headache with mild vertigo.  Denies shortness of breath, abdominal pain, nausea, vomiting or other systemic symptoms.  Patient states that she has been compliant with her blood pressure regimen and is supposed to have taken her 0.25 mg dose of Ozempic but has not taken it yet today.   Past Medical History Past Medical History:  Diagnosis Date   Allergy    Anemia    Carotid stenosis    Chronic combined systolic and diastolic CHF (congestive heart failure) (HCC)    CKD (chronic kidney disease), stage IV ([place]) 09/24/2013   Pt at [facility], Dr. Joelyn Oms   COVID-19 10/24/2021   Diabetes mellitus type 2 in nonobese [facility])    Edema 09/14/2013   GERD (gastroesophageal reflux disease)    History of ovarian cancer    Hyperlipidemia    Hypertension    Mild CAD    non-obstructive by LHC (09/25/2013): Proximal and mid LAD serial 20%, proximal circumflex 30%, mid AV groove circumflex 30%, mid RCA mild plaque.   NICM (nonischemic cardiomyopathy) (HCC)    Obesity (BMI 30-39.9)    RBBB    Renal artery stenosis ([place]) 10/11/2018   Thyroid disease    Seen by specialist   Patient Active Problem List   Diagnosis Date Noted   Dizziness 04/17/2022   (HFpEF) heart failure with preserved ejection fraction ([place])  02/28/2022   Hypomagnesemia 02/28/2022   Manic episode, unspecified ([place])    COVID-19 virus infection 10/25/2021   UTI (urinary tract infection) 10/25/2021   Anemia 10/25/2021   Suicidal ideations 10/17/2021   Acute renal failure superimposed on stage 4 chronic kidney disease ([place]) 10/14/2021   ARF (acute renal failure) ([place]) 10/14/2021   Small bowel obstruction (HCC)    SBO (small bowel obstruction) ([place]) 09/22/2021   Adjustment disorder with anxiety 06/10/2021   Atypical chest pain 11/27/2018   Renal artery stenosis ([place]) 10/11/2018   Hyperlipidemia associated with type 2 diabetes mellitus ([place]) 10/26/2013   Chronic combined systolic and diastolic heart failure ([place]) 09/28/2013   Anemia of chronic kidney failure, stage 4 (severe) ([place]) 09/24/2013   Cardiomyopathy, hypertensive ([place]) 09/18/2013   Edema 09/14/2013   Acute combined systolic and diastolic congestive heart failure ([place]) 09/09/2013   Hypertensive heart disease with CHF (congestive heart failure) ([place]) 09/09/2013   Hypertension associated with diabetes ([place]) 09/07/2013   Type 2 diabetes mellitus with chronic kidney disease, without long-term current use of insulin ([place]) 09/07/2013   Obesity (BMI 30-39.9)    History of ovarian cancer    Home Medication(s) Prior to Admission medications   Medication Sig Start Date End Date Taking? Authorizing Provider  carvedilol (COREG) 25 MG tablet Take 25 mg by mouth 2 (two) times daily with a meal.    [provider]  cetirizine (ZYRTEC) 10 MG tablet Take 10 mg by mouth  daily as needed for allergies.    [provider]  famotidine (PEPCID) 40 MG tablet Take 40 mg by mouth daily. 09/04/21   [provider]  fluticasone (FLONASE) 50 MCG/ACT nasal spray Place 2 sprays into both nostrils daily.    [provider]  furosemide (LASIX) 40 MG tablet Take 1 tablet (40 mg total) by mouth daily after breakfast. [id number]   Delora Fuel, MD  glucose blood (ONE  TOUCH ULTRA TEST) test strip Use to check blood sugar 3 times daily Dx code E11.65 Patient taking differently: 1 each by Other route See admin instructions. Use to check blood sugar 3 times daily Dx code E11.65 11/20/14   Elayne Snare, MD  guaiFENesin (ROBITUSSIN) 100 MG/5ML liquid Take 5-10 mLs (100-200 mg total) by mouth every 6 (six) hours as needed for cough or to loosen phlegm. Patient not taking: Reported on 08/30/2022 07/21/22   Domenic Moras, PA-C  lisinopril (ZESTRIL) 40 MG tablet Take 40 mg by mouth daily. 06/17/22   [provider]  omeprazole (PRILOSEC) 40 MG capsule Take 40 mg by mouth daily. 03/10/22   [provider]  ONETOUCH VERIO test strip [place] Patient taking differently: 1 each by Other route in the morning, at noon, and at bedtime. 03/30/18   Elayne Snare, MD  OZEMPIC, 0.25 OR 0.5 MG/DOSE, 2 MG/1.5ML SOPN Inject 0.25 mg into the skin once a week. Sundays 05/12/21   [provider]  polyethylene glycol (MIRALAX / GLYCOLAX) 17 g packet Take 17 g by mouth daily. Patient not taking: Reported on 10/25/2021 09/28/21   Eugenie Filler, MD  rosuvastatin (CRESTOR) 20 MG tablet Take 20 mg by mouth daily.    [provider]  sucralfate (CARAFATE) 1 g tablet Take 1 tablet (1 g total) by mouth 4 (four) times daily -  with meals and at bedtime. 05/23/22   Montine Circle, PA-C                                                                                                                                    Past Surgical History Past Surgical History:  Procedure Laterality Date   LEFT HEART CATHETERIZATION WITH CORONARY ANGIOGRAM N/A 09/25/2013   Procedure: LEFT HEART CATHETERIZATION WITH CORONARY ANGIOGRAM;  Surgeon: Burnell Blanks, MD;  Location: [facility] CATH LAB;  Service: Cardiovascular;  Laterality: N/A;   RENAL ANGIOGRAPHY N/A 10/06/2018   Procedure: RENAL ANGIOGRAPHY;  Surgeon: Marty Heck, MD;  Location: [facility] CV  LAB;  Service: Cardiovascular;  Laterality: N/A;   Family History Family History  Problem Relation Age of Onset   Diabetes Father    Hypertension Father    Hypertension Maternal Grandfather     Social History Social History   Tobacco Use   Smoking status: Never   Smokeless tobacco: Never  Vaping Use   Vaping Use: Never used  Substance Use Topics  Alcohol use: No   Drug use: No   Allergies Hydralazine hcl and Atorvastatin  Review of Systems Review of Systems  HENT:  Positive for hearing loss.   Respiratory:  Positive for chest tightness.   Cardiovascular:  Positive for chest pain.  Neurological:  Positive for dizziness and headaches.    Physical Exam Vital Signs  I have reviewed the triage vital signs BP (!) 219/101 (BP Location: Left Arm)   Pulse 88   Temp 98.2 F (36.8 C) (Oral)   Resp 20   SpO2 98%   Physical Exam Vitals and nursing note reviewed.  Constitutional:      General: She is not in acute distress.    Appearance: She is well-developed.  HENT:     Head: Normocephalic and atraumatic.  Eyes:     Conjunctiva/sclera: Conjunctivae normal.  Cardiovascular:     Rate and Rhythm: Normal rate and regular rhythm.     Heart sounds: No murmur heard. Pulmonary:     Effort: Pulmonary effort is normal. No respiratory distress.     Breath sounds: Normal breath sounds.  Abdominal:     Palpations: Abdomen is soft.     Tenderness: There is no abdominal tenderness.  Musculoskeletal:        General: No swelling.     Cervical back: Neck supple.  Skin:    General: Skin is warm and dry.     Capillary Refill: Capillary refill takes less than 2 seconds.  Neurological:     Mental Status: She is alert.  Psychiatric:        Mood and Affect: Mood normal.     ED Results and Treatments Labs (all labs ordered are listed, but only abnormal results are displayed) Labs Reviewed  BASIC METABOLIC PANEL - Abnormal; Notable for the following components:      Result  Value   Glucose, Bld 135 (*)    BUN 51 (*)    Creatinine, Ser 3.79 (*)    Calcium 8.7 (*)    GFR, Estimated 12 (*)    All other components within normal limits  CBC - Abnormal; Notable for the following components:   RBC 3.26 (*)    Hemoglobin 9.2 (*)    HCT 28.9 (*)    All other components within normal limits  TROPONIN I (HIGH SENSITIVITY) - Abnormal; Notable for the following components:   Troponin I (High Sensitivity) 41 (*)    All other components within normal limits  TSH  URINALYSIS, ROUTINE W REFLEX MICROSCOPIC  RAPID URINE DRUG SCREEN, HOSP PERFORMED  TROPONIN I (HIGH SENSITIVITY)                                                                                                                          Radiology DG Chest 2 View  Result Date: 10/18/2022 CLINICAL DATA:  Chest pain and chills. EXAM: CHEST - 2 VIEW COMPARISON:  PA Lat 10/10/2022. FINDINGS: The heart size and mediastinal contours are within  normal limits. Thoracic aorta is heavily calcified. Both lungs are clear. The visualized skeletal structures are intact, with thoracic spondylosis. IMPRESSION: No evidence of acute chest disease.  Aortic atherosclerosis. Electronically Signed   By: Telford Nab M.D.   On: 10/18/2022 22:17    Pertinent labs & imaging results that were available during my care of the patient were reviewed by me and considered in my medical decision making (see MDM for details).  Medications Ordered in ED Medications  prochlorperazine (COMPAZINE) injection 10 mg (has no administration in time range)  diphenhydrAMINE (BENADRYL) injection 25 mg (has no administration in time range)  labetalol (NORMODYNE) injection 5 mg (has no administration in time range)                                                                                                                                     Procedures Procedures  (including critical care time)  Medical Decision Making / ED Course   This patient  presents to the ED for concern of ***, this involves an extensive number of treatment options, and is a complaint that carries with it a high risk of complications and morbidity.  The differential diagnosis includes ***  MDM: ***   Additional history obtained: -Additional history obtained from *** -External records from outside source obtained and reviewed including: Chart review including previous notes, labs, imaging, consultation notes   Lab Tests: -I ordered, reviewed, and interpreted labs.   The pertinent results include:   Labs Reviewed  BASIC METABOLIC PANEL - Abnormal; Notable for the following components:      Result Value   Glucose, Bld 135 (*)    BUN 51 (*)    Creatinine, Ser 3.79 (*)    Calcium 8.7 (*)    GFR, Estimated 12 (*)    All other components within normal limits  CBC - Abnormal; Notable for the following components:   RBC 3.26 (*)    Hemoglobin 9.2 (*)    HCT 28.9 (*)    All other components within normal limits  TROPONIN I (HIGH SENSITIVITY) - Abnormal; Notable for the following components:   Troponin I (High Sensitivity) 41 (*)    All other components within normal limits  TSH  URINALYSIS, ROUTINE W REFLEX MICROSCOPIC  RAPID URINE DRUG SCREEN, HOSP PERFORMED  TROPONIN I (HIGH SENSITIVITY)      EKG ***  EKG Interpretation  Date/Time:  Sunday October 18 2022 21:43:51 EST Ventricular Rate:  86 PR Interval:  187 QRS Duration: 148 QT Interval:  408 QTC Calculation: 488 R Axis:   -11 Text Interpretation: Sinus rhythm Probable left atrial enlargement Right bundle branch block Left ventricular hypertrophy Borderline prolonged QT interval Baseline wander in lead(s) II V1 V4 No significant change since last tracing Confirmed by Leanord Asal ([id number]) on 10/18/2022 10:24:28 PM         Imaging Studies ordered: I ordered imaging studies including ***  I independently visualized and interpreted imaging. I agree with the radiologist  interpretation   Medicines ordered and prescription drug management: Meds ordered this encounter  Medications   prochlorperazine (COMPAZINE) injection 10 mg   diphenhydrAMINE (BENADRYL) injection 25 mg   labetalol (NORMODYNE) injection 5 mg    -I have reviewed the patients home medicines and have made adjustments as needed  Critical interventions ***  Consultations Obtained: I requested consultation with the ***,  and discussed lab and imaging findings as well as pertinent plan - they recommend: ***   Cardiac Monitoring: The patient was maintained on a cardiac monitor.  I personally viewed and interpreted the cardiac monitored which showed an underlying rhythm of: ***  Social Determinants of Health:  Factors impacting patients care include: ***   Reevaluation: After the interventions noted above, I reevaluated the patient and found that they have :{resolved/improved/worsened:23923::"improved"}  Co morbidities that complicate the patient evaluation  Past Medical History:  Diagnosis Date   Allergy    Anemia    Carotid stenosis    Chronic combined systolic and diastolic CHF (congestive heart failure) ([place])    CKD (chronic kidney disease), stage IV ([place]) 09/24/2013   Pt at [facility], Dr. Joelyn Oms   COVID-19 10/24/2021   Diabetes mellitus type 2 in nonobese [facility])    Edema 09/14/2013   GERD (gastroesophageal reflux disease)    History of ovarian cancer    Hyperlipidemia    Hypertension    Mild CAD    non-obstructive by LHC (09/25/2013): Proximal and mid LAD serial 20%, proximal circumflex 30%, mid AV groove circumflex 30%, mid RCA mild plaque.   NICM (nonischemic cardiomyopathy) (HCC)    Obesity (BMI 30-39.9)    RBBB    Renal artery stenosis ([place]) 10/11/2018   Thyroid disease    Seen by specialist      Dispostion: I considered admission for this patient, ***     Final Clinical Impression(s) / ED Diagnoses Final diagnoses:  None     '@PCDICTATION'$ @

## 2022-10-18 NOTE — ED Notes (Signed)
Pt also c/o ringing in her ears that started while arrival to the hospital

## 2022-10-19 LAB — TSH: TSH: 1.017 u[IU]/mL (ref 0.350–4.500)

## 2022-10-19 LAB — URINALYSIS, ROUTINE W REFLEX MICROSCOPIC
Bilirubin Urine: NEGATIVE
Glucose, UA: NEGATIVE mg/dL
Ketones, ur: NEGATIVE mg/dL
Nitrite: NEGATIVE
Protein, ur: >=300 mg/dL — AB
Specific Gravity, Urine: 1.008 (ref 1.005–1.030)
pH: 5 (ref 5.0–8.0)

## 2022-10-19 LAB — RAPID URINE DRUG SCREEN, HOSP PERFORMED
Amphetamines: NOT DETECTED
Barbiturates: NOT DETECTED
Benzodiazepines: NOT DETECTED
Cocaine: NOT DETECTED
Opiates: NOT DETECTED
Tetrahydrocannabinol: NOT DETECTED

## 2022-10-19 LAB — TROPONIN I (HIGH SENSITIVITY): Troponin I (High Sensitivity): 43 ng/L — ABNORMAL HIGH (ref <18)

## 2022-10-19 MED ORDER — LISINOPRIL 10 MG PO TABS
40.0000 mg | ORAL_TABLET | Freq: Once | ORAL | Status: AC
  Administered 2022-10-19: 40 mg via ORAL
  Filled 2022-10-19: qty 4

## 2022-10-19 MED ORDER — CARVEDILOL 12.5 MG PO TABS
25.0000 mg | ORAL_TABLET | Freq: Two times a day (BID) | ORAL | Status: DC
  Administered 2022-10-19: 25 mg via ORAL
  Filled 2022-10-19: qty 2

## 2022-11-02 ENCOUNTER — Emergency Department (HOSPITAL_COMMUNITY): Payer: Medicare PPO

## 2022-11-02 ENCOUNTER — Emergency Department (HOSPITAL_COMMUNITY)
Admission: EM | Admit: 2022-11-02 | Discharge: 2022-11-02 | Disposition: A | Discharge status: Home / Self Care | Payer: Medicare PPO | Source: Home / Self Care | Attending: Emergency Medicine | Admitting: Emergency Medicine

## 2022-11-02 ENCOUNTER — Encounter (HOSPITAL_COMMUNITY): Payer: Self-pay | Admitting: Emergency Medicine

## 2022-11-02 DIAGNOSIS — I161 Hypertensive emergency: Secondary | ICD-10-CM | POA: Diagnosis not present

## 2022-11-02 DIAGNOSIS — I10 Essential (primary) hypertension: Secondary | ICD-10-CM

## 2022-11-02 DIAGNOSIS — E1122 Type 2 diabetes mellitus with diabetic chronic kidney disease: Secondary | ICD-10-CM | POA: Insufficient documentation

## 2022-11-02 DIAGNOSIS — N189 Chronic kidney disease, unspecified: Secondary | ICD-10-CM | POA: Insufficient documentation

## 2022-11-02 DIAGNOSIS — I130 Hypertensive heart and chronic kidney disease with heart failure and stage 1 through stage 4 chronic kidney disease, or unspecified chronic kidney disease: Secondary | ICD-10-CM | POA: Insufficient documentation

## 2022-11-02 DIAGNOSIS — R519 Headache, unspecified: Secondary | ICD-10-CM | POA: Insufficient documentation

## 2022-11-02 DIAGNOSIS — R079 Chest pain, unspecified: Secondary | ICD-10-CM | POA: Diagnosis not present

## 2022-11-02 DIAGNOSIS — Z79899 Other long term (current) drug therapy: Secondary | ICD-10-CM | POA: Insufficient documentation

## 2022-11-02 DIAGNOSIS — I509 Heart failure, unspecified: Secondary | ICD-10-CM | POA: Insufficient documentation

## 2022-11-02 LAB — MAGNESIUM: Magnesium: 1.9 mg/dL (ref 1.7–2.4)

## 2022-11-02 LAB — COMPREHENSIVE METABOLIC PANEL WITH GFR
ALT: 11 U/L (ref 0–44)
AST: 19 U/L (ref 15–41)
Albumin: 3.2 g/dL — ABNORMAL LOW (ref 3.5–5.0)
Alkaline Phosphatase: 51 U/L (ref 38–126)
Anion gap: 15 (ref 5–15)
BUN: 39 mg/dL — ABNORMAL HIGH (ref 8–23)
CO2: 21 mmol/L — ABNORMAL LOW (ref 22–32)
Calcium: 8.9 mg/dL (ref 8.9–10.3)
Chloride: 107 mmol/L (ref 98–111)
Creatinine, Ser: 4.61 mg/dL — ABNORMAL HIGH (ref 0.44–1.00)
GFR, Estimated: 10 mL/min — ABNORMAL LOW
Glucose, Bld: 109 mg/dL — ABNORMAL HIGH (ref 70–99)
Potassium: 4.4 mmol/L (ref 3.5–5.1)
Sodium: 143 mmol/L (ref 135–145)
Total Bilirubin: 0.3 mg/dL (ref 0.3–1.2)
Total Protein: 6.4 g/dL — ABNORMAL LOW (ref 6.5–8.1)

## 2022-11-02 LAB — CBC WITH DIFFERENTIAL/PLATELET
Abs Immature Granulocytes: 0.02 10*3/uL (ref 0.00–0.07)
Basophils Absolute: 0 10*3/uL (ref 0.0–0.1)
Basophils Relative: 1 %
Eosinophils Absolute: 0.1 10*3/uL (ref 0.0–0.5)
Eosinophils Relative: 2 %
HCT: 30.1 % — ABNORMAL LOW (ref 36.0–46.0)
Hemoglobin: 9.6 g/dL — ABNORMAL LOW (ref 12.0–15.0)
Immature Granulocytes: 0 %
Lymphocytes Relative: 26 %
Lymphs Abs: 1.5 10*3/uL (ref 0.7–4.0)
MCH: 28.2 pg (ref 26.0–34.0)
MCHC: 31.9 g/dL (ref 30.0–36.0)
MCV: 88.5 fL (ref 80.0–100.0)
Monocytes Absolute: 0.4 10*3/uL (ref 0.1–1.0)
Monocytes Relative: 8 %
Neutro Abs: 3.5 10*3/uL (ref 1.7–7.7)
Neutrophils Relative %: 63 %
Platelets: 238 10*3/uL (ref 150–400)
RBC: 3.4 MIL/uL — ABNORMAL LOW (ref 3.87–5.11)
RDW: 13.9 % (ref 11.5–15.5)
WBC: 5.6 10*3/uL (ref 4.0–10.5)
nRBC: 0 % (ref 0.0–0.2)

## 2022-11-02 LAB — TROPONIN I (HIGH SENSITIVITY)
Troponin I (High Sensitivity): 68 ng/L — ABNORMAL HIGH (ref <18)
Troponin I (High Sensitivity): 60 ng/L — ABNORMAL HIGH (ref <18)

## 2022-11-02 MED ORDER — CARVEDILOL 12.5 MG PO TABS
25.0000 mg | ORAL_TABLET | Freq: Once | ORAL | Status: AC
  Administered 2022-11-02: 25 mg via ORAL
  Filled 2022-11-02: qty 2

## 2022-11-02 MED ORDER — ACETAMINOPHEN 500 MG PO TABS
1000.0000 mg | ORAL_TABLET | Freq: Once | ORAL | Status: AC
  Administered 2022-11-02: 1000 mg via ORAL
  Filled 2022-11-02: qty 2

## 2022-11-02 MED ORDER — METOCLOPRAMIDE HCL 5 MG/ML IJ SOLN
10.0000 mg | Freq: Once | INTRAMUSCULAR | Status: AC
  Administered 2022-11-02: 10 mg via INTRAVENOUS
  Filled 2022-11-02: qty 2

## 2022-11-02 NOTE — ED Provider Triage Note (Signed)
Emergency Medicine Provider Triage Evaluation Note  Teresa Poole , a 72 y.o. female  was evaluated in triage.  Pt complains of pressure on the right side head to neck, into chest and down right arm. Similar to presentation to [facility] last month but states worse, found to have low Mg.   Review of Systems  Positive:  Negative:   Physical Exam  There were no vitals taken for this visit. Gen:   Awake, no distress   Resp:  Normal effort  MSK:   Moves extremities without difficulty  Other:  Speech clear, moves all extremities, ambulatory without assistance   Medical Decision Making  Medically screening exam initiated at 4:43 PM.  Appropriate orders placed.  ICA HOLFORD was informed that the remainder of the evaluation will be completed by another provider, this initial triage assessment does not replace that evaluation, and the importance of remaining in the ED until their evaluation is complete.     Tacy Learn, PA-C 11/02/22 1645

## 2022-11-02 NOTE — ED Triage Notes (Signed)
Pt here from home with c/o right side head pain along with right arm pain and some right side chest pain

## 2022-11-02 NOTE — Discharge Instructions (Signed)
You were seen in the emergency department for your headache and your high blood pressure.  Your workup showed no signs of abnormalities with your heart, your kidney function was mildly elevated from your baseline but your labs otherwise appeared normal.  You had no signs of stroke.  Your headache improved with headache medication here.  You should continue to take your blood pressure medications as prescribed and you can follow-up with your primary doctor and your nephrologist to have your kidney function and blood pressure rechecked and to determine if you need any other medication changes.  You should return to the emergency department for significantly worsening headaches, repetitive vomiting, numbness or weakness on one side of the body compared to the other or if you have any other new or concerning symptoms.

## 2022-11-02 NOTE — ED Provider Notes (Signed)
[facility] Provider Note   CSN: [id number] Arrival date & time: 11/02/22  1634     History  No chief complaint on file.   Teresa Poole is a 72 y.o. female.  Patient is a 72 year old female with a past medical history of hypertension, CKD, diabetes and CHF presenting to the emergency department with a headache.  The patient states that today she started to feel a throbbing type headache on the right side of her head.  She states that she will occasionally hear a whooshing that she states that she can get from her blood pressure being high.  She states that she checked her blood pressure at home and it was [id number] systolic.  She states that she has been taking her blood pressure medication as prescribed.  She also reported to triage that she was having chest pain and right arm pain however denies this to myself.  She states that her headache was not sudden onset and is similar to prior headaches that she has had in the past.  She denies any nausea, vomiting, numbness or weakness.  The history is provided by the patient.       Home Medications Prior to Admission medications   Medication Sig Start Date End Date Taking? Authorizing Provider  carvedilol (COREG) 25 MG tablet Take 25 mg by mouth 2 (two) times daily with a meal.    [provider]  famotidine (PEPCID) 40 MG tablet Take 40 mg by mouth daily. 09/04/21   [provider]  furosemide (LASIX) 40 MG tablet Take 1 tablet (40 mg total) by mouth daily after breakfast. [id number]   Delora Fuel, MD  glucose blood (ONE TOUCH ULTRA TEST) test strip Use to check blood sugar 3 times daily Dx code E11.65 Patient taking differently: 1 each by Other route See admin instructions. Use to check blood sugar 3 times daily Dx code E11.65 11/20/14   Elayne Snare, MD  guaiFENesin (ROBITUSSIN) 100 MG/5ML liquid Take 5-10 mLs (100-200 mg total) by mouth every 6 (six) hours as needed for cough or  to loosen phlegm. Patient not taking: Reported on 08/30/2022 07/21/22   Domenic Moras, PA-C  lisinopril (ZESTRIL) 40 MG tablet Take 40 mg by mouth daily. 06/17/22   [provider]  omeprazole (PRILOSEC) 40 MG capsule Take 40 mg by mouth daily. 03/10/22   [provider]  ONETOUCH VERIO test strip [place] Patient taking differently: 1 each by Other route in the morning, at noon, and at bedtime. 03/30/18   Elayne Snare, MD  OZEMPIC, 0.25 OR 0.5 MG/DOSE, 2 MG/1.5ML SOPN Inject 0.25 mg into the skin once a week. Sundays 05/12/21   [provider]  polyethylene glycol (MIRALAX / GLYCOLAX) 17 g packet Take 17 g by mouth daily. Patient not taking: Reported on 10/25/2021 09/28/21   Eugenie Filler, MD  rosuvastatin (CRESTOR) 20 MG tablet Take 20 mg by mouth daily.    [provider]  sucralfate (CARAFATE) 1 g tablet Take 1 tablet (1 g total) by mouth 4 (four) times daily -  with meals and at bedtime. Patient not taking: Reported on 10/19/2022 05/23/22   Montine Circle, PA-C      Allergies    Hydralazine hcl and Atorvastatin    Review of Systems   Review of Systems  Physical Exam Updated Vital Signs BP (!) 198/81   Pulse 95   Temp 98.4 F (36.9 C) (Oral)  Resp 19   SpO2 98%  Physical Exam Vitals and nursing note reviewed.  Constitutional:      General: She is not in acute distress.    Appearance: Normal appearance.  HENT:     Head: Normocephalic and atraumatic.     Comments: No temporal tenderness bilaterally    Nose: Nose normal.     Mouth/Throat:     Mouth: Mucous membranes are moist.     Pharynx: Oropharynx is clear.  Eyes:     Extraocular Movements: Extraocular movements intact.     Conjunctiva/sclera: Conjunctivae normal.     Pupils: Pupils are equal, round, and reactive to light.  Cardiovascular:     Rate and Rhythm: Normal rate and regular rhythm.     Heart sounds: Normal heart sounds.  Pulmonary:     Effort:  Pulmonary effort is normal.     Breath sounds: Normal breath sounds.  Abdominal:     General: Abdomen is flat.     Palpations: Abdomen is soft.     Tenderness: There is no abdominal tenderness.  Musculoskeletal:        General: Normal range of motion.     Cervical back: Normal range of motion and neck supple.     Right lower leg: No edema.     Left lower leg: No edema.  Skin:    General: Skin is warm and dry.  Neurological:     General: No focal deficit present.     Mental Status: She is alert and oriented to person, place, and time.     Cranial Nerves: No cranial nerve deficit.     Sensory: No sensory deficit.     Motor: No weakness.     Coordination: Coordination normal.  Psychiatric:        Mood and Affect: Mood normal.        Behavior: Behavior normal.     ED Results / Procedures / Treatments   Labs (all labs ordered are listed, but only abnormal results are displayed) Labs Reviewed  CBC WITH DIFFERENTIAL/PLATELET - Abnormal; Notable for the following components:      Result Value   RBC 3.40 (*)    Hemoglobin 9.6 (*)    HCT 30.1 (*)    All other components within normal limits  COMPREHENSIVE METABOLIC PANEL - Abnormal; Notable for the following components:   CO2 21 (*)    Glucose, Bld 109 (*)    BUN 39 (*)    Creatinine, Ser 4.61 (*)    Total Protein 6.4 (*)    Albumin 3.2 (*)    GFR, Estimated 10 (*)    All other components within normal limits  TROPONIN I (HIGH SENSITIVITY) - Abnormal; Notable for the following components:   Troponin I (High Sensitivity) 68 (*)    All other components within normal limits  TROPONIN I (HIGH SENSITIVITY) - Abnormal; Notable for the following components:   Troponin I (High Sensitivity) 60 (*)    All other components within normal limits  MAGNESIUM    EKG EKG Interpretation  Date/Time:  Monday November 02 2022 20:21:27 EDT Ventricular Rate:  81 PR Interval:  182 QRS Duration: 153 QT Interval:  453 QTC Calculation: 526 R  Axis:   -24 Text Interpretation: Sinus rhythm Right bundle branch block Left ventricular hypertrophy Prolonged QT interval No significant change since last tracing today Confirmed by Leanord Asal ([id number]) on 11/02/2022 8:31:23 PM  Radiology DG Chest 2 View  Result Date: 11/02/2022 CLINICAL DATA:  Chest pain, RIGHT arm pain, congestion EXAM: CHEST - 2 VIEW COMPARISON:  10/18/2022 FINDINGS: Normal heart size, mediastinal contours, and pulmonary vascularity. Atherosclerotic calcification aorta. Lungs clear. No pulmonary infiltrate, pleural effusion, or pneumothorax. Osseous structures unremarkable. IMPRESSION: No acute abnormalities. Aortic Atherosclerosis (ICD10-I70.0). Electronically Signed   By: Lavonia Dana M.D.   On: 11/02/2022 17:24    Procedures Procedures    Medications Ordered in ED Medications  metoCLOPramide (REGLAN) injection 10 mg (10 mg Intravenous Given 11/02/22 2140)  acetaminophen (TYLENOL) tablet 1,000 mg (1,000 mg Oral Given 11/02/22 2139)  carvedilol (COREG) tablet 25 mg (25 mg Oral Given 11/02/22 2139)    ED Course/ Medical Decision Making/ A&P                             Medical Decision Making This patient presents to the ED with chief complaint(s) of headache, hypertension with pertinent past medical history of hypertension, CKD, CHF, diabetes which further complicates the presenting complaint. The complaint involves an extensive differential diagnosis and also carries with it a high risk of complications and morbidity.    The differential diagnosis includes hypertensive urgency, patient has no focal neurologic deficits making ICH, mass effect or CVA unlikely, patient has no temporal tenderness and no visual deficits making GCA unlikely, tension headache, migraine headache  Additional history obtained: Additional history obtained from N/A Records reviewed Care Everywhere/External Records  ED Course and Reassessment: Patient was initially evaluated by provider in  triage and had EKG, labs and chest x-ray performed.  Labs showed mild increase of her creatinine from her baseline, troponins have been flat and EKG is without acute ischemic changes.  Remainder of her labs are negative.  Her blood pressure had improved to the A999333 systolic while was in the room but she was continued to have headache and was treated with headache cocktail with Tylenol and Reglan and given her evening Coreg medications.  After medication management she reported that her headache resolved.  Patient is stable for discharge home with primary care follow-up for reassessment and further blood pressure management.  Independent labs interpretation:  The following labs were independently interpreted: Mild increase of creatinine from baseline, troponin mildly elevated but flat on repeat, labs otherwise at patient's baseline  Independent visualization of imaging: - I independently visualized the following imaging with scope of interpretation limited to determining acute life threatening conditions related to emergency care: Chest x-ray, which revealed no acute disease  Consultation: - Consulted or discussed management/test interpretation w/ external professional: N/A  Consideration for admission or further workup: Patient has no emergent conditions requiring admission or further work-up at this time and is stable for discharge home with primary care follow-up  Social Determinants of health: N/A    Risk OTC drugs. Prescription drug management.          Final Clinical Impression(s) / ED Diagnoses Final diagnoses:  Acute nonintractable headache, unspecified headache type  Hypertension, unspecified type    Rx / DC Orders ED Discharge Orders     None         Kemper Durie, DO 11/02/22 2340

## 2022-11-04 ENCOUNTER — Emergency Department (HOSPITAL_COMMUNITY): Payer: Medicare PPO

## 2022-11-04 ENCOUNTER — Other Ambulatory Visit: Payer: Self-pay

## 2022-11-04 ENCOUNTER — Inpatient Hospital Stay (HOSPITAL_COMMUNITY)
Admission: EM | Admit: 2022-11-04 | Discharge: 2022-11-13 | DRG: 281 | Disposition: A | Discharge status: Home / Self Care | Payer: Medicare PPO | Attending: Internal Medicine | Admitting: Internal Medicine

## 2022-11-04 ENCOUNTER — Encounter (HOSPITAL_COMMUNITY): Payer: Self-pay

## 2022-11-04 DIAGNOSIS — F919 Conduct disorder, unspecified: Secondary | ICD-10-CM | POA: Diagnosis present

## 2022-11-04 DIAGNOSIS — Z8249 Family history of ischemic heart disease and other diseases of the circulatory system: Secondary | ICD-10-CM

## 2022-11-04 DIAGNOSIS — M542 Cervicalgia: Secondary | ICD-10-CM | POA: Diagnosis present

## 2022-11-04 DIAGNOSIS — I5022 Chronic systolic (congestive) heart failure: Secondary | ICD-10-CM | POA: Diagnosis present

## 2022-11-04 DIAGNOSIS — E611 Iron deficiency: Secondary | ICD-10-CM | POA: Diagnosis present

## 2022-11-04 DIAGNOSIS — F02818 Dementia in other diseases classified elsewhere, unspecified severity, with other behavioral disturbance: Secondary | ICD-10-CM | POA: Insufficient documentation

## 2022-11-04 DIAGNOSIS — I5042 Chronic combined systolic (congestive) and diastolic (congestive) heart failure: Secondary | ICD-10-CM | POA: Diagnosis present

## 2022-11-04 DIAGNOSIS — I119 Hypertensive heart disease without heart failure: Secondary | ICD-10-CM | POA: Diagnosis present

## 2022-11-04 DIAGNOSIS — I132 Hypertensive heart and chronic kidney disease with heart failure and with stage 5 chronic kidney disease, or end stage renal disease: Secondary | ICD-10-CM | POA: Diagnosis present

## 2022-11-04 DIAGNOSIS — I160 Hypertensive urgency: Secondary | ICD-10-CM | POA: Diagnosis present

## 2022-11-04 DIAGNOSIS — F02811 Dementia in other diseases classified elsewhere, unspecified severity, with agitation: Secondary | ICD-10-CM | POA: Diagnosis present

## 2022-11-04 DIAGNOSIS — I5030 Unspecified diastolic (congestive) heart failure: Secondary | ICD-10-CM | POA: Diagnosis present

## 2022-11-04 DIAGNOSIS — K219 Gastro-esophageal reflux disease without esophagitis: Secondary | ICD-10-CM | POA: Diagnosis present

## 2022-11-04 DIAGNOSIS — I161 Hypertensive emergency: Principal | ICD-10-CM

## 2022-11-04 DIAGNOSIS — N184 Chronic kidney disease, stage 4 (severe): Secondary | ICD-10-CM | POA: Diagnosis present

## 2022-11-04 DIAGNOSIS — Z8616 Personal history of COVID-19: Secondary | ICD-10-CM

## 2022-11-04 DIAGNOSIS — N185 Chronic kidney disease, stage 5: Secondary | ICD-10-CM | POA: Diagnosis present

## 2022-11-04 DIAGNOSIS — I21A1 Myocardial infarction type 2: Secondary | ICD-10-CM | POA: Diagnosis present

## 2022-11-04 DIAGNOSIS — Z79899 Other long term (current) drug therapy: Secondary | ICD-10-CM

## 2022-11-04 DIAGNOSIS — Z888 Allergy status to other drugs, medicaments and biological substances status: Secondary | ICD-10-CM

## 2022-11-04 DIAGNOSIS — R519 Headache, unspecified: Secondary | ICD-10-CM | POA: Diagnosis present

## 2022-11-04 DIAGNOSIS — I428 Other cardiomyopathies: Secondary | ICD-10-CM | POA: Diagnosis present

## 2022-11-04 DIAGNOSIS — I4510 Unspecified right bundle-branch block: Secondary | ICD-10-CM | POA: Diagnosis present

## 2022-11-04 DIAGNOSIS — N289 Disorder of kidney and ureter, unspecified: Secondary | ICD-10-CM

## 2022-11-04 DIAGNOSIS — I43 Cardiomyopathy in diseases classified elsewhere: Secondary | ICD-10-CM | POA: Diagnosis present

## 2022-11-04 DIAGNOSIS — R0789 Other chest pain: Secondary | ICD-10-CM | POA: Diagnosis present

## 2022-11-04 DIAGNOSIS — Z8543 Personal history of malignant neoplasm of ovary: Secondary | ICD-10-CM

## 2022-11-04 DIAGNOSIS — Z634 Disappearance and death of family member: Secondary | ICD-10-CM

## 2022-11-04 DIAGNOSIS — R7989 Other specified abnormal findings of blood chemistry: Secondary | ICD-10-CM

## 2022-11-04 DIAGNOSIS — Z781 Physical restraint status: Secondary | ICD-10-CM

## 2022-11-04 DIAGNOSIS — E538 Deficiency of other specified B group vitamins: Secondary | ICD-10-CM | POA: Diagnosis present

## 2022-11-04 DIAGNOSIS — D631 Anemia in chronic kidney disease: Secondary | ICD-10-CM | POA: Diagnosis present

## 2022-11-04 DIAGNOSIS — I5021 Acute systolic (congestive) heart failure: Secondary | ICD-10-CM | POA: Diagnosis present

## 2022-11-04 DIAGNOSIS — Z833 Family history of diabetes mellitus: Secondary | ICD-10-CM

## 2022-11-04 DIAGNOSIS — F05 Delirium due to known physiological condition: Secondary | ICD-10-CM | POA: Diagnosis present

## 2022-11-04 DIAGNOSIS — R079 Chest pain, unspecified: Secondary | ICD-10-CM

## 2022-11-04 DIAGNOSIS — E1122 Type 2 diabetes mellitus with diabetic chronic kidney disease: Secondary | ICD-10-CM | POA: Diagnosis present

## 2022-11-04 DIAGNOSIS — I2510 Atherosclerotic heart disease of native coronary artery without angina pectoris: Secondary | ICD-10-CM | POA: Diagnosis present

## 2022-11-04 DIAGNOSIS — I10 Essential (primary) hypertension: Secondary | ICD-10-CM | POA: Diagnosis present

## 2022-11-04 DIAGNOSIS — I2489 Other forms of acute ischemic heart disease: Secondary | ICD-10-CM

## 2022-11-04 DIAGNOSIS — E1159 Type 2 diabetes mellitus with other circulatory complications: Secondary | ICD-10-CM | POA: Diagnosis present

## 2022-11-04 DIAGNOSIS — Z602 Problems related to living alone: Secondary | ICD-10-CM | POA: Diagnosis present

## 2022-11-04 DIAGNOSIS — I152 Hypertension secondary to endocrine disorders: Secondary | ICD-10-CM | POA: Diagnosis present

## 2022-11-04 DIAGNOSIS — Z7985 Long-term (current) use of injectable non-insulin antidiabetic drugs: Secondary | ICD-10-CM

## 2022-11-04 DIAGNOSIS — E785 Hyperlipidemia, unspecified: Secondary | ICD-10-CM | POA: Diagnosis present

## 2022-11-04 DIAGNOSIS — R45851 Suicidal ideations: Secondary | ICD-10-CM | POA: Diagnosis present

## 2022-11-04 DIAGNOSIS — N179 Acute kidney failure, unspecified: Secondary | ICD-10-CM | POA: Diagnosis present

## 2022-11-04 DIAGNOSIS — I701 Atherosclerosis of renal artery: Secondary | ICD-10-CM | POA: Diagnosis present

## 2022-11-04 DIAGNOSIS — E1169 Type 2 diabetes mellitus with other specified complication: Secondary | ICD-10-CM | POA: Diagnosis present

## 2022-11-04 DIAGNOSIS — R4189 Other symptoms and signs involving cognitive functions and awareness: Secondary | ICD-10-CM | POA: Diagnosis present

## 2022-11-04 LAB — CBC
HCT: 29.1 % — ABNORMAL LOW (ref 36.0–46.0)
Hemoglobin: 9.1 g/dL — ABNORMAL LOW (ref 12.0–15.0)
MCH: 27.9 pg (ref 26.0–34.0)
MCHC: 31.3 g/dL (ref 30.0–36.0)
MCV: 89.3 fL (ref 80.0–100.0)
Platelets: 228 K/uL (ref 150–400)
RBC: 3.26 MIL/uL — ABNORMAL LOW (ref 3.87–5.11)
RDW: 13.7 % (ref 11.5–15.5)
WBC: 5.5 K/uL (ref 4.0–10.5)
nRBC: 0 % (ref 0.0–0.2)

## 2022-11-04 LAB — BASIC METABOLIC PANEL
Anion gap: 11 (ref 5–15)
BUN: 38 mg/dL — ABNORMAL HIGH (ref 8–23)
CO2: 24 mmol/L (ref 22–32)
Calcium: 8.8 mg/dL — ABNORMAL LOW (ref 8.9–10.3)
Chloride: 104 mmol/L (ref 98–111)
Creatinine, Ser: 4.61 mg/dL — ABNORMAL HIGH (ref 0.44–1.00)
GFR, Estimated: 10 mL/min — ABNORMAL LOW (ref >60)
Glucose, Bld: 113 mg/dL — ABNORMAL HIGH (ref 70–99)
Potassium: 4 mmol/L (ref 3.5–5.1)
Sodium: 139 mmol/L (ref 135–145)

## 2022-11-04 LAB — URINALYSIS, ROUTINE W REFLEX MICROSCOPIC
Bacteria, UA: NONE SEEN
Bilirubin Urine: NEGATIVE
Glucose, UA: NEGATIVE mg/dL
Ketones, ur: NEGATIVE mg/dL
Leukocytes,Ua: NEGATIVE
Nitrite: NEGATIVE
Protein, ur: 100 mg/dL — AB
Specific Gravity, Urine: 1.004 — ABNORMAL LOW (ref 1.005–1.030)
pH: 6 (ref 5.0–8.0)

## 2022-11-04 LAB — TROPONIN I (HIGH SENSITIVITY): Troponin I (High Sensitivity): 49 ng/L — ABNORMAL HIGH (ref <18)

## 2022-11-04 NOTE — ED Provider Triage Note (Signed)
Emergency Medicine Provider Triage Evaluation Note  Teresa Poole , a 72 y.o. female  was evaluated in triage.  Pt complains of concerns for sudden sternal chest pain prior to arrival.  Notes this been going on for 45-50 minutes.  Feels like someone is pressing on her chest.  Notes that her pain radiates to the left jaw.  Patient denies nausea, vomiting, urinary symptoms.  Also notes headache.  Patient was evaluated in the emergency department on 3/11 at her primary care doctor's office yesterday for her headache.  Review of Systems  Positive:  Negative:   Physical Exam  BP (!) 214/84   Pulse 75   Temp 98.2 F (36.8 C) (Oral)   Resp 18   Ht '5\' 7"'$  (1.702 m)   Wt 74.8 kg   SpO2 92%   BMI 25.84 kg/m  Gen:   Awake, no distress   Resp:  Normal effort  MSK:   Moves extremities without difficulty  Other:  Grip strength 5/5 bilaterally.  Strength intact to bilateral lower extremities.  PERRL.  EOMI.  Sternal chest wall tenderness to palpation  Medical Decision Making  Medically screening exam initiated at 9:06 PM.  Appropriate orders placed.  Teresa Poole was informed that the remainder of the evaluation will be completed by another provider, this initial triage assessment does not replace that evaluation, and the importance of remaining in the ED until their evaluation is complete.  Work up initiated   [place], [employer], PA-C 11/04/22 2108

## 2022-11-04 NOTE — ED Triage Notes (Signed)
Sudden mid sternal chest pains beginning ~ 20 minutes prior to arrival. Admits pain radiated to left jaw and feels like someone is "pressing on chest".   Arrives with sister. She verbalizes pt  has had memory issues for ~2 years and was at [facility] yesterday for similair pains.

## 2022-11-05 ENCOUNTER — Encounter (HOSPITAL_COMMUNITY): Payer: Self-pay | Admitting: Internal Medicine

## 2022-11-05 ENCOUNTER — Inpatient Hospital Stay (HOSPITAL_COMMUNITY): Payer: Medicare PPO

## 2022-11-05 DIAGNOSIS — D649 Anemia, unspecified: Secondary | ICD-10-CM | POA: Diagnosis not present

## 2022-11-05 DIAGNOSIS — I160 Hypertensive urgency: Secondary | ICD-10-CM | POA: Diagnosis not present

## 2022-11-05 DIAGNOSIS — F03918 Unspecified dementia, unspecified severity, with other behavioral disturbance: Secondary | ICD-10-CM | POA: Diagnosis not present

## 2022-11-05 DIAGNOSIS — E1122 Type 2 diabetes mellitus with diabetic chronic kidney disease: Secondary | ICD-10-CM | POA: Diagnosis present

## 2022-11-05 DIAGNOSIS — Z888 Allergy status to other drugs, medicaments and biological substances status: Secondary | ICD-10-CM | POA: Diagnosis not present

## 2022-11-05 DIAGNOSIS — I6523 Occlusion and stenosis of bilateral carotid arteries: Secondary | ICD-10-CM

## 2022-11-05 DIAGNOSIS — D631 Anemia in chronic kidney disease: Secondary | ICD-10-CM | POA: Diagnosis present

## 2022-11-05 DIAGNOSIS — R451 Restlessness and agitation: Secondary | ICD-10-CM | POA: Diagnosis not present

## 2022-11-05 DIAGNOSIS — I43 Cardiomyopathy in diseases classified elsewhere: Secondary | ICD-10-CM | POA: Diagnosis present

## 2022-11-05 DIAGNOSIS — I428 Other cardiomyopathies: Secondary | ICD-10-CM | POA: Diagnosis present

## 2022-11-05 DIAGNOSIS — F05 Delirium due to known physiological condition: Secondary | ICD-10-CM | POA: Diagnosis present

## 2022-11-05 DIAGNOSIS — E1169 Type 2 diabetes mellitus with other specified complication: Secondary | ICD-10-CM | POA: Diagnosis present

## 2022-11-05 DIAGNOSIS — Z8616 Personal history of COVID-19: Secondary | ICD-10-CM | POA: Diagnosis not present

## 2022-11-05 DIAGNOSIS — F02818 Dementia in other diseases classified elsewhere, unspecified severity, with other behavioral disturbance: Secondary | ICD-10-CM | POA: Diagnosis present

## 2022-11-05 DIAGNOSIS — Z781 Physical restraint status: Secondary | ICD-10-CM | POA: Diagnosis not present

## 2022-11-05 DIAGNOSIS — N179 Acute kidney failure, unspecified: Secondary | ICD-10-CM | POA: Diagnosis present

## 2022-11-05 DIAGNOSIS — I2510 Atherosclerotic heart disease of native coronary artery without angina pectoris: Secondary | ICD-10-CM | POA: Diagnosis present

## 2022-11-05 DIAGNOSIS — R45851 Suicidal ideations: Secondary | ICD-10-CM | POA: Diagnosis present

## 2022-11-05 DIAGNOSIS — I152 Hypertension secondary to endocrine disorders: Secondary | ICD-10-CM | POA: Diagnosis present

## 2022-11-05 DIAGNOSIS — N185 Chronic kidney disease, stage 5: Secondary | ICD-10-CM | POA: Diagnosis present

## 2022-11-05 DIAGNOSIS — I5021 Acute systolic (congestive) heart failure: Secondary | ICD-10-CM | POA: Diagnosis not present

## 2022-11-05 DIAGNOSIS — I21A1 Myocardial infarction type 2: Secondary | ICD-10-CM | POA: Diagnosis present

## 2022-11-05 DIAGNOSIS — E538 Deficiency of other specified B group vitamins: Secondary | ICD-10-CM | POA: Diagnosis present

## 2022-11-05 DIAGNOSIS — Z79899 Other long term (current) drug therapy: Secondary | ICD-10-CM | POA: Diagnosis not present

## 2022-11-05 DIAGNOSIS — F02811 Dementia in other diseases classified elsewhere, unspecified severity, with agitation: Secondary | ICD-10-CM | POA: Diagnosis present

## 2022-11-05 DIAGNOSIS — E785 Hyperlipidemia, unspecified: Secondary | ICD-10-CM | POA: Diagnosis present

## 2022-11-05 DIAGNOSIS — I5033 Acute on chronic diastolic (congestive) heart failure: Secondary | ICD-10-CM | POA: Diagnosis not present

## 2022-11-05 DIAGNOSIS — R0789 Other chest pain: Secondary | ICD-10-CM

## 2022-11-05 DIAGNOSIS — I2489 Other forms of acute ischemic heart disease: Secondary | ICD-10-CM | POA: Diagnosis not present

## 2022-11-05 DIAGNOSIS — I10 Essential (primary) hypertension: Secondary | ICD-10-CM | POA: Diagnosis not present

## 2022-11-05 DIAGNOSIS — I132 Hypertensive heart and chronic kidney disease with heart failure and with stage 5 chronic kidney disease, or end stage renal disease: Secondary | ICD-10-CM | POA: Diagnosis present

## 2022-11-05 DIAGNOSIS — R4189 Other symptoms and signs involving cognitive functions and awareness: Secondary | ICD-10-CM | POA: Diagnosis present

## 2022-11-05 DIAGNOSIS — R7989 Other specified abnormal findings of blood chemistry: Secondary | ICD-10-CM | POA: Diagnosis not present

## 2022-11-05 DIAGNOSIS — I5022 Chronic systolic (congestive) heart failure: Secondary | ICD-10-CM | POA: Diagnosis not present

## 2022-11-05 DIAGNOSIS — I161 Hypertensive emergency: Secondary | ICD-10-CM | POA: Diagnosis present

## 2022-11-05 DIAGNOSIS — I5042 Chronic combined systolic (congestive) and diastolic (congestive) heart failure: Secondary | ICD-10-CM | POA: Diagnosis present

## 2022-11-05 DIAGNOSIS — R079 Chest pain, unspecified: Secondary | ICD-10-CM | POA: Diagnosis present

## 2022-11-05 LAB — CBG MONITORING, ED
Glucose-Capillary: 119 mg/dL — ABNORMAL HIGH (ref 70–99)
Glucose-Capillary: 119 mg/dL — ABNORMAL HIGH (ref 70–99)

## 2022-11-05 LAB — CBC
HCT: 27.3 % — ABNORMAL LOW (ref 36.0–46.0)
Hemoglobin: 8.6 g/dL — ABNORMAL LOW (ref 12.0–15.0)
MCH: 28.1 pg (ref 26.0–34.0)
MCHC: 31.5 g/dL (ref 30.0–36.0)
MCV: 89.2 fL (ref 80.0–100.0)
Platelets: 202 10*3/uL (ref 150–400)
RBC: 3.06 MIL/uL — ABNORMAL LOW (ref 3.87–5.11)
RDW: 13.9 % (ref 11.5–15.5)
WBC: 4.7 10*3/uL (ref 4.0–10.5)
nRBC: 0 % (ref 0.0–0.2)

## 2022-11-05 LAB — CREATININE, SERUM
Creatinine, Ser: 4.71 mg/dL — ABNORMAL HIGH (ref 0.44–1.00)
GFR, Estimated: 9 mL/min — ABNORMAL LOW (ref >60)

## 2022-11-05 LAB — HIV ANTIBODY (ROUTINE TESTING W REFLEX): HIV Screen 4th Generation wRfx: NONREACTIVE

## 2022-11-05 LAB — GLUCOSE, CAPILLARY: Glucose-Capillary: 169 mg/dL — ABNORMAL HIGH (ref 70–99)

## 2022-11-05 LAB — TROPONIN I (HIGH SENSITIVITY)
Troponin I (High Sensitivity): 54 ng/L — ABNORMAL HIGH (ref <18)
Troponin I (High Sensitivity): 39 ng/L — ABNORMAL HIGH (ref <18)

## 2022-11-05 MED ORDER — ACETAMINOPHEN 500 MG PO TABS
1000.0000 mg | ORAL_TABLET | Freq: Once | ORAL | Status: AC
  Administered 2022-11-05: 1000 mg via ORAL
  Filled 2022-11-05: qty 2

## 2022-11-05 MED ORDER — LABETALOL HCL 5 MG/ML IV SOLN
10.0000 mg | INTRAVENOUS | Status: DC | PRN
  Administered 2022-11-05 – 2022-11-07 (×2): 10 mg via INTRAVENOUS
  Filled 2022-11-05 (×3): qty 4

## 2022-11-05 MED ORDER — AMLODIPINE BESYLATE 10 MG PO TABS
10.0000 mg | ORAL_TABLET | Freq: Every day | ORAL | Status: DC
  Administered 2022-11-05 – 2022-11-13 (×9): 10 mg via ORAL
  Filled 2022-11-05 (×8): qty 1
  Filled 2022-11-05: qty 2

## 2022-11-05 MED ORDER — INSULIN ASPART 100 UNIT/ML IJ SOLN
0.0000 [IU] | Freq: Every day | INTRAMUSCULAR | Status: DC
  Administered 2022-11-08: 5 [IU] via SUBCUTANEOUS
  Filled 2022-11-05: qty 0.05

## 2022-11-05 MED ORDER — PANTOPRAZOLE SODIUM 40 MG PO TBEC
40.0000 mg | DELAYED_RELEASE_TABLET | Freq: Every day | ORAL | Status: DC
  Administered 2022-11-05 – 2022-11-13 (×9): 40 mg via ORAL
  Filled 2022-11-05 (×9): qty 1

## 2022-11-05 MED ORDER — ACETAMINOPHEN 650 MG RE SUPP: 650.0000 mg | Freq: Four times a day (QID) | RECTAL | Status: DC | PRN

## 2022-11-05 MED ORDER — INSULIN ASPART 100 UNIT/ML IJ SOLN
0.0000 [IU] | Freq: Three times a day (TID) | INTRAMUSCULAR | Status: DC
  Administered 2022-11-06: 2 [IU] via SUBCUTANEOUS
  Administered 2022-11-06: 3 [IU] via SUBCUTANEOUS
  Administered 2022-11-07: 5 [IU] via SUBCUTANEOUS
  Administered 2022-11-07: 2 [IU] via SUBCUTANEOUS
  Administered 2022-11-08: 3 [IU] via SUBCUTANEOUS
  Administered 2022-11-08: 2 [IU] via SUBCUTANEOUS
  Administered 2022-11-09: 3 [IU] via SUBCUTANEOUS
  Administered 2022-11-13 (×2): 2 [IU] via SUBCUTANEOUS
  Filled 2022-11-05: qty 0.15

## 2022-11-05 MED ORDER — ASPIRIN 81 MG PO CHEW
324.0000 mg | CHEWABLE_TABLET | Freq: Once | ORAL | Status: AC
  Administered 2022-11-05: 324 mg via ORAL
  Filled 2022-11-05: qty 4

## 2022-11-05 MED ORDER — LISINOPRIL 20 MG PO TABS
40.0000 mg | ORAL_TABLET | Freq: Every day | ORAL | Status: DC
  Administered 2022-11-05: 40 mg via ORAL
  Filled 2022-11-05: qty 2

## 2022-11-05 MED ORDER — ACETAMINOPHEN 325 MG PO TABS
650.0000 mg | ORAL_TABLET | Freq: Four times a day (QID) | ORAL | Status: DC | PRN
  Administered 2022-11-10: 650 mg via ORAL
  Filled 2022-11-05 (×2): qty 2

## 2022-11-05 MED ORDER — LABETALOL HCL 5 MG/ML IV SOLN
20.0000 mg | Freq: Once | INTRAVENOUS | Status: AC
  Administered 2022-11-05: 20 mg via INTRAVENOUS
  Filled 2022-11-05: qty 4

## 2022-11-05 MED ORDER — HEPARIN SODIUM (PORCINE) 5000 UNIT/ML IJ SOLN
5000.0000 [IU] | Freq: Three times a day (TID) | INTRAMUSCULAR | Status: DC
  Administered 2022-11-05 – 2022-11-13 (×14): 5000 [IU] via SUBCUTANEOUS
  Filled 2022-11-05 (×18): qty 1

## 2022-11-05 MED ORDER — TRAZODONE HCL 50 MG PO TABS
25.0000 mg | ORAL_TABLET | Freq: Every evening | ORAL | Status: DC | PRN
  Administered 2022-11-07: 25 mg via ORAL
  Filled 2022-11-05: qty 1

## 2022-11-05 MED ORDER — OXYCODONE HCL 5 MG PO TABS
5.0000 mg | ORAL_TABLET | ORAL | Status: DC | PRN
  Administered 2022-11-07 – 2022-11-10 (×3): 5 mg via ORAL
  Filled 2022-11-05 (×5): qty 1

## 2022-11-05 MED ORDER — ROSUVASTATIN CALCIUM 20 MG PO TABS
20.0000 mg | ORAL_TABLET | Freq: Every day | ORAL | Status: DC
  Administered 2022-11-05 – 2022-11-13 (×9): 20 mg via ORAL
  Filled 2022-11-05 (×9): qty 1

## 2022-11-05 MED ORDER — ONDANSETRON HCL 4 MG PO TABS: 4.0000 mg | ORAL_TABLET | Freq: Four times a day (QID) | ORAL | Status: DC | PRN

## 2022-11-05 MED ORDER — NITROGLYCERIN 0.4 MG SL SUBL
0.4000 mg | SUBLINGUAL_TABLET | SUBLINGUAL | Status: AC | PRN
  Administered 2022-11-05 (×3): 0.4 mg via SUBLINGUAL
  Filled 2022-11-05: qty 1

## 2022-11-05 MED ORDER — ONDANSETRON HCL 4 MG/2ML IJ SOLN: 4.0000 mg | Freq: Four times a day (QID) | INTRAMUSCULAR | Status: DC | PRN

## 2022-11-05 MED ORDER — METHOCARBAMOL 500 MG PO TABS
750.0000 mg | ORAL_TABLET | Freq: Four times a day (QID) | ORAL | Status: DC | PRN
  Administered 2022-11-05 – 2022-11-07 (×2): 750 mg via ORAL
  Filled 2022-11-05 (×2): qty 2

## 2022-11-05 MED ORDER — CARVEDILOL 12.5 MG PO TABS: 25.0000 mg | ORAL_TABLET | Freq: Two times a day (BID) | ORAL | Status: DC

## 2022-11-05 MED ORDER — DOCUSATE SODIUM 100 MG PO CAPS
100.0000 mg | ORAL_CAPSULE | Freq: Two times a day (BID) | ORAL | Status: DC
  Administered 2022-11-05 – 2022-11-13 (×16): 100 mg via ORAL
  Filled 2022-11-05 (×17): qty 1

## 2022-11-05 MED ORDER — CLONIDINE HCL 0.1 MG PO TABS
0.1000 mg | ORAL_TABLET | ORAL | Status: AC
  Administered 2022-11-05: 0.1 mg via ORAL
  Filled 2022-11-05: qty 1

## 2022-11-05 MED ORDER — POLYETHYLENE GLYCOL 3350 17 G PO PACK: 17.0000 g | PACK | Freq: Every day | ORAL | Status: DC | PRN

## 2022-11-05 NOTE — ED Notes (Signed)
Ambulatory to b/r, steady gait.

## 2022-11-05 NOTE — ED Notes (Signed)
Pt alert, NAD, calm, interactive, resps e/u. Speaking clearly. Finished breakfast tray. Denies pain, sob, nausea, or constipation. BP remains elevated.

## 2022-11-05 NOTE — ED Provider Notes (Signed)
[facility] DEPT Provider Note: Georgena Spurling, MD, FACEP  CSN: [id number] MRN: [id number] ARRIVAL: 11/04/22 at 2038 ROOM: [id number]   CHIEF COMPLAINT  Chest Pain   HISTORY OF PRESENT ILLNESS  11/05/22 4:03 AM Teresa Poole Teresa Poole is a 72 y.o. female with a history of chronic kidney disease and congestive heart failure.  She is here with chest pain and elevated blood pressure.  The chest pain began about 20 minutes prior to arrival.  She describes the chest pain as intermittent and feels like acid reflux on the left side of her chest radiating to the left side of her neck.  She also describes it as feeling like someone is pressing on her chest.  She rates her pain as a 5 out of 10.  She has had memory issues for the past 2 years so her sister assists with her history.  She has been seen in [facility] health ED's multiple times in the past few months, including visits for similar chest pain.  On 10/10/2022 her troponins were 22 and 20.  On 10/18/2022 her troponins were 41 and 43.  On 11/02/2022 her troponins were 68 and 60.  She was not admitted to the hospital.  She does not currently have a cardiologist.  She has also been having headaches recently but denies a headache at the present moment.    Past Medical History:  Diagnosis Date   Allergy    Anemia    Carotid stenosis    Chronic combined systolic and diastolic CHF (congestive heart failure) (HCC)    CKD (chronic kidney disease), stage IV ([place]) 09/24/2013   Pt at [facility], Dr. Joelyn Oms   COVID-19 10/24/2021   Diabetes mellitus type 2 in nonobese [facility])    Edema 09/14/2013   GERD (gastroesophageal reflux disease)    History of ovarian cancer    Hyperlipidemia    Hypertension    Mild CAD    non-obstructive by LHC (09/25/2013): Proximal and mid LAD serial 20%, proximal circumflex 30%, mid AV groove circumflex 30%, mid RCA mild plaque.   NICM (nonischemic cardiomyopathy) (HCC)    Obesity (BMI 30-39.9)    RBBB    Renal artery stenosis  ([place]) 10/11/2018   Thyroid disease    Seen by specialist    Past Surgical History:  Procedure Laterality Date   LEFT HEART CATHETERIZATION WITH CORONARY ANGIOGRAM N/A 09/25/2013   Procedure: LEFT HEART CATHETERIZATION WITH CORONARY ANGIOGRAM;  Surgeon: Burnell Blanks, MD;  Location: [facility] CATH LAB;  Service: Cardiovascular;  Laterality: N/A;   RENAL ANGIOGRAPHY N/A 10/06/2018   Procedure: RENAL ANGIOGRAPHY;  Surgeon: Marty Heck, MD;  Location: [facility] CV LAB;  Service: Cardiovascular;  Laterality: N/A;    Family History  Problem Relation Age of Onset   Diabetes Father    Hypertension Father    Hypertension Maternal Grandfather     Social History   Tobacco Use   Smoking status: Never   Smokeless tobacco: Never  Vaping Use   Vaping Use: Never used  Substance Use Topics   Alcohol use: No   Drug use: No    Prior to Admission medications   Medication Sig Start Date End Date Taking? Authorizing Provider  carvedilol (COREG) 25 MG tablet Take 25 mg by mouth 2 (two) times daily with a meal.    [provider]  famotidine (PEPCID) 40 MG tablet Take 40 mg by mouth daily. 09/04/21   [provider]  furosemide (LASIX) 40 MG tablet Take 1  tablet (40 mg total) by mouth daily after breakfast. [id number]   Delora Fuel, MD  glucose blood (ONE TOUCH ULTRA TEST) test strip Use to check blood sugar 3 times daily Dx code E11.65 Patient taking differently: 1 each by Other route See admin instructions. Use to check blood sugar 3 times daily Dx code E11.65 11/20/14   Elayne Snare, MD  guaiFENesin (ROBITUSSIN) 100 MG/5ML liquid Take 5-10 mLs (100-200 mg total) by mouth every 6 (six) hours as needed for cough or to loosen phlegm. Patient not taking: Reported on 08/30/2022 07/21/22   Domenic Moras, PA-C  lisinopril (ZESTRIL) 40 MG tablet Take 40 mg by mouth daily. 06/17/22   [provider]  omeprazole (PRILOSEC) 40 MG capsule Take 40 mg by mouth daily. 03/10/22    [provider]  ONETOUCH VERIO test strip [place] Patient taking differently: 1 each by Other route in the morning, at noon, and at bedtime. 03/30/18   Elayne Snare, MD  OZEMPIC, 0.25 OR 0.5 MG/DOSE, 2 MG/1.5ML SOPN Inject 0.25 mg into the skin once a week. Sundays 05/12/21   [provider]  polyethylene glycol (MIRALAX / GLYCOLAX) 17 g packet Take 17 g by mouth daily. Patient not taking: Reported on 10/25/2021 09/28/21   Eugenie Filler, MD  rosuvastatin (CRESTOR) 20 MG tablet Take 20 mg by mouth daily.    [provider]  sucralfate (CARAFATE) 1 g tablet Take 1 tablet (1 g total) by mouth 4 (four) times daily -  with meals and at bedtime. Patient not taking: Reported on 10/19/2022 05/23/22   Montine Circle, PA-C    Allergies Hydralazine hcl and Atorvastatin   REVIEW OF SYSTEMS  Negative except as noted here or in the History of Present Illness.   PHYSICAL EXAMINATION  Initial Vital Signs Blood pressure (!) 214/84, pulse 75, temperature 98.2 F (36.8 C), temperature source Oral, resp. rate 18, height '5\' 7"'$  (1.702 m), weight 74.8 kg, SpO2 92 %.  Examination General: Well-developed, well-nourished female in no acute distress; appearance consistent with age of record HENT: normocephalic; atraumatic Eyes: Normal appearance Neck: supple Heart: regular rate and rhythm Lungs: clear to auscultation bilaterally Abdomen: soft; nondistended; nontender; bowel sounds present Extremities: No deformity; full range of motion; pulses normal Neurologic: Awake, alert and oriented x 2; motor function intact in all extremities and symmetric; no facial droop Skin: Warm and dry Psychiatric: Normal mood and affect   RESULTS  Summary of this visit's results, reviewed and interpreted by myself:   EKG Interpretation  Date/Time:  Wednesday November 04 2022 20:45:30 EDT Ventricular Rate:  80 PR Interval:  178 QRS Duration: 152 QT Interval:  450 QTC  Calculation: 520 R Axis:   -21 Text Interpretation: Sinus rhythm Right bundle branch block Probable left ventricular hypertrophy Prolonged QT interval No significant change was found Confirmed by Shanon Rosser [phone]) on 11/05/2022 4:07:46 AM       Laboratory Studies: Results for orders placed or performed during the hospital encounter of 11/04/22 (from the past 24 hour(s))  Basic metabolic panel     Status: Abnormal   Collection Time: 11/04/22  9:29 PM  Result Value Ref Range   Sodium 139 135 - 145 mmol/L   Potassium 4.0 3.5 - 5.1 mmol/L   Chloride 104 98 - 111 mmol/L   CO2 24 22 - 32 mmol/L   Glucose, Bld 113 (H) 70 - 99 mg/dL   BUN 38 (H) 8 - 23 mg/dL   Creatinine,  Ser 4.61 (H) 0.44 - 1.00 mg/dL   Calcium 8.8 (L) 8.9 - 10.3 mg/dL   GFR, Estimated 10 (L) >60 mL/min   Anion gap 11 5 - 15  CBC     Status: Abnormal   Collection Time: 11/04/22  9:29 PM  Result Value Ref Range   WBC 5.5 4.0 - 10.5 K/uL   RBC 3.26 (L) 3.87 - 5.11 MIL/uL   Hemoglobin 9.1 (L) 12.0 - 15.0 g/dL   HCT 29.1 (L) 36.0 - 46.0 %   MCV 89.3 80.0 - 100.0 fL   MCH 27.9 26.0 - 34.0 pg   MCHC 31.3 30.0 - 36.0 g/dL   RDW 13.7 11.5 - 15.5 %   Platelets 228 150 - 400 K/uL   nRBC 0.0 0.0 - 0.2 %  Troponin I (High Sensitivity)     Status: Abnormal   Collection Time: 11/04/22  9:29 PM  Result Value Ref Range   Troponin I (High Sensitivity) 49 (H) <18 ng/L  Urinalysis, Routine w reflex microscopic -Urine, Clean Catch     Status: Abnormal   Collection Time: 11/04/22  9:29 PM  Result Value Ref Range   Color, Urine COLORLESS (A) YELLOW   APPearance CLEAR CLEAR   Specific Gravity, Urine 1.004 (L) 1.005 - 1.030   pH 6.0 5.0 - 8.0   Glucose, UA NEGATIVE NEGATIVE mg/dL   Hgb urine dipstick SMALL (A) NEGATIVE   Bilirubin Urine NEGATIVE NEGATIVE   Ketones, ur NEGATIVE NEGATIVE mg/dL   Protein, ur 100 (A) NEGATIVE mg/dL   Nitrite NEGATIVE NEGATIVE   Leukocytes,Ua NEGATIVE NEGATIVE   RBC / HPF 0-5 0 - 5 RBC/hpf   WBC,  UA 0-5 0 - 5 WBC/hpf   Bacteria, UA NONE SEEN NONE SEEN   Squamous Epithelial / HPF 0-5 0 - 5 /HPF   Mucus PRESENT   Troponin I (High Sensitivity)     Status: Abnormal   Collection Time: 11/05/22  4:29 AM  Result Value Ref Range   Troponin I (High Sensitivity) 54 (H) <18 ng/L   Imaging Studies: CT Head Wo Contrast  Result Date: 11/04/2022 CLINICAL DATA:  Headache EXAM: CT HEAD WITHOUT CONTRAST TECHNIQUE: Contiguous axial images were obtained from the base of the skull through the vertex without intravenous contrast. RADIATION DOSE REDUCTION: This exam was performed according to the departmental dose-optimization program which includes automated exposure control, adjustment of the mA and/or kV according to patient size and/or use of iterative reconstruction technique. COMPARISON:  CT head 10/18/2022.  MRI head 04/03/2022. FINDINGS: Brain: No evidence of acute infarction, hemorrhage, hydrocephalus, extra-axial collection or mass lesion/mass effect. Vascular: Atherosclerotic calcifications are present within the cavernous internal carotid arteries. Skull: Normal. Negative for fracture or focal lesion. Sinuses/Orbits: No acute finding. Other: None. IMPRESSION: No acute intracranial abnormality. Electronically Signed   By: Ronney Asters M.D.   On: 11/04/2022 22:08   DG Chest 2 View  Result Date: 11/04/2022 CLINICAL DATA:  chest pains EXAM: CHEST - 2 VIEW COMPARISON:  Chest x-ray 11/02/2022, chest x-ray 10/18/2022, chest x-ray 05/14/2022 FINDINGS: The heart and mediastinal contours are unchanged. Aortic calcification. Question developing airspace/interstitial opacity overlying the left upper lung zone. No pulmonary edema. No pleural effusion. No pneumothorax. No acute osseous abnormality. IMPRESSION: 1. Question developing airspace/interstitial opacity overlying the left upper lung zone. 2.  Aortic Atherosclerosis (ICD10-I70.0). Electronically Signed   By: Iven Finn M.D.   On: 11/04/2022 21:32     ED COURSE and MDM  Nursing notes, initial and subsequent  vitals signs, including pulse oximetry, reviewed and interpreted by myself.  Vitals:   11/05/22 0445 11/05/22 0515 11/05/22 0530 11/05/22 0545  BP: (!) 214/99 (!) 194/75 (!) 179/96 (!) 182/80  Pulse: 74 70 79 73  Resp: '13 12 15 17  '$ Temp:      TempSrc:      SpO2: 100% 100% 100% 100%  Weight:      Height:       Medications  acetaminophen (TYLENOL) tablet 1,000 mg (1,000 mg Oral Given 11/05/22 0432)  nitroGLYCERIN (NITROSTAT) SL tablet 0.4 mg (0.4 mg Sublingual Given 11/05/22 0524)  aspirin chewable tablet 324 mg (324 mg Oral Given 11/05/22 0432)  labetalol (NORMODYNE) injection 20 mg (20 mg Intravenous Given 11/05/22 0553)   5:29 AM Patient still complaining of chest pain (and now headache) after 3 sublingual nitroglycerin tablets.  Her second troponin his increased.  I am concerned that she has been having episodic chest pain, usually at rest, with elevated troponins for over a month now.  Her blood pressure is poorly controlled and her creatinine is worsening.  5:42 AM Discussed with Dr. Rodman Pickle, the cardiology fellow on-call.  He thinks the patient's elevated troponins are due to worsening renal function in the setting of poorly controlled hypertension.  He does not advise anticoagulation at this point.  They will consult if the patient is admitted to the hospitalist service.  5:51 AM Dr. Bridgett Larsson of the hospitalist service consulted.  He will have the patient transferred to [facility] and admitted by one of the dayshift hospitalists.   PROCEDURES  Procedures CRITICAL CARE Performed by: Karen Chafe Mukhtar Shams Total critical care time: 30 minutes Critical care time was exclusive of separately billable procedures and treating other patients. Critical care was necessary to treat or prevent imminent or life-threatening deterioration. Critical care was time spent personally by me on the following activities: development of treatment  plan with patient and/or surrogate as well as nursing, discussions with consultants, evaluation of patient's response to treatment, examination of patient, obtaining history from patient or surrogate, ordering and performing treatments and interventions, ordering and review of laboratory studies, ordering and review of radiographic studies, pulse oximetry and re-evaluation of patient's condition.   ED DIAGNOSES     ICD-10-CM   1. Uncontrolled hypertension  I10     2. Elevated troponin I level  R79.89     3. Renal insufficiency  N28.9     4. Chest pain at rest  R07.9          Shanon Rosser, MD 11/05/22 [phone]

## 2022-11-05 NOTE — Consult Note (Addendum)
Cardiology Consultation   Patient ID: MONI WHILEY MRN: [id number]; DOB: September 21, 1950  Admit date: 11/04/2022 Date of Consult: 11/05/2022  PCP:  [name], MD   [place] Providers Cardiologist:  Larae Grooms, MD      Patient Profile:   Teresa Poole is a 72 y.o. female with a hx of NICM, chronic combined CHF, mild nonobstructive CAD (cath 2015), anemia, CKD stage IV, HTN, HLD, GERD, carotid stenosis, L renal artery stenosis s/p stenting 2020, thyroid disease who is being seen 11/05/2022 for the evaluation of elevated troponin at the request of Dr. Florina Ou with emergency medicine.  History of Present Illness:   Teresa Poole is a 72 year old female with above medical history who is followed by Dr. Irish Lack.  Per chart review, patient had an echocardiogram in 08/2013 that showed LVEF 30-35%, mild LVH, no regional wall motion abnormalities.  Had a nuclear stress test in 08/2013 that showed EF 41%, inferolateral wall with mild hypokinesis, inferolateral scar and scar involving the distal anteroseptal wall and apex.  For further evaluation, had a heart catheterization that showed nonobstructive CAD.  She had carotid ultrasounds in 09/2014 that showed minor bilateral carotid atherosclerosis, no hemodynamically significant ICA stenosis by ultrasound.  Repeat carotid ultrasounds in 09/2018 showed 1-39% stenosis in right ICA, 40-59% stenosis in left ICA.  Patient underwent renal angiography with Dr. Carlis Abbott in 09/2018 and had a stent placed to the left renal artery.  Patient was admitted to the hospital in 05/2021 for evaluation of dyspnea on exertion, chest pressure.  Found to have elevated troponins. Echocardiogram on 06/11/2021 showed EF 55-60%, mild hypokinesis in the basal inferior wall with normal wall motion otherwise, mild LVH, normal RV systolic function.  Cardiology recommended blood pressure control and risk factor modifications.  It was suspected that elevated  troponin was due to renal dysfunction.  Patient was admitted to the hospital in 02/2022 for evaluation of chest pain.  Patient was evaluated by cardiology, during their evaluation patient complained of more abdominal pain and was not having chest pain or shortness of breath.  Her troponins were negative x 2.  Cardiology recommended evaluation for noncardiac causes of symptoms and signed off.  Most recent echocardiogram from 04/18/2012 showed EF 55-60%, no regional wall motion abnormalities.  High-sensitivity troponin 49, 54.  CT head showed no acute intracranial abnormality.  Cardiology was asked to consult for elevated troponins.  Per chart review, patient frequently visits the ER for variety of complaints.  She has been seen in the ER 15 separate occasions in the past 6 months.  She was most recently seen on 3/11 for evaluation of right sided throbbing headache.  Blood pressure was elevated to 198/81.  High-sensitivity troponin 68, 60.  Creatinine was elevated to 4.61.  EKG was without ischemic changes.  Patient was treated with a headache cocktail and given home blood pressure medications.  Patient's headache resolved and was discharged from the ED.  Patient presented to the [facility] ED on 3/13 complaining of sudden, midsternal chest pains that began about 20 minutes prior to arrival.  Reported that pain was radiating to left jaw and felt like someone was pressing on her chest.  Patient was accompanied by her sister, who reported that patient had been having memory issues for the past 2 years and had been seen at [facility] yesterday for similar pain.  In the ED, potassium was 4.0, creatinine remained elevated to 4.61.  WBC 5.5, hemoglobin 9.1, platelets 228.   Today,  patient is accompanied by her sister.  Patient reports that she has had some memory issues for about a year and that they have been worsening over time. Sister brought patient to the hospital yesterday evening because the patient showed up at her  house complaining of symptoms.  Patient had told her sister that she had a headache and left neck pain.  Sister took patient's blood pressure and found it to be elevated to about 285/120. After a while, patient also started to complain about pounding chest pain and a high pitched ringing in her ears. In the ED, patient was given SL nitroglycerin without any improvement in symptoms. Now, patient complains of chest pressure, a ringing in her ears, and hearing a "whooshing" sound. Also continues to have some left sided neck pain that is worse with palpation. Denies vision changes, weakness.   Past Medical History:  Diagnosis Date   Allergy    Anemia    Carotid stenosis    Chronic combined systolic and diastolic CHF (congestive heart failure) (HCC)    CKD (chronic kidney disease), stage IV ([place]) 09/24/2013   Pt at [facility], Dr. Joelyn Oms   COVID-19 10/24/2021   Diabetes mellitus type 2 in nonobese [facility])    Edema 09/14/2013   GERD (gastroesophageal reflux disease)    History of ovarian cancer    Hyperlipidemia    Hypertension    Mild CAD    non-obstructive by LHC (09/25/2013): Proximal and mid LAD serial 20%, proximal circumflex 30%, mid AV groove circumflex 30%, mid RCA mild plaque.   NICM (nonischemic cardiomyopathy) (HCC)    Obesity (BMI 30-39.9)    RBBB    Renal artery stenosis ([place]) 10/11/2018   Thyroid disease    Seen by specialist    Past Surgical History:  Procedure Laterality Date   LEFT HEART CATHETERIZATION WITH CORONARY ANGIOGRAM N/A 09/25/2013   Procedure: LEFT HEART CATHETERIZATION WITH CORONARY ANGIOGRAM;  Surgeon: Burnell Blanks, MD;  Location: [facility] CATH LAB;  Service: Cardiovascular;  Laterality: N/A;   RENAL ANGIOGRAPHY N/A 10/06/2018   Procedure: RENAL ANGIOGRAPHY;  Surgeon: Marty Heck, MD;  Location: [facility] CV LAB;  Service: Cardiovascular;  Laterality: N/A;     Home Medications:  Prior to Admission medications   Medication Sig Start Date End  Date Taking? Authorizing Provider  carvedilol (COREG) 25 MG tablet Take 25 mg by mouth 2 (two) times daily with a meal.    [provider]  famotidine (PEPCID) 40 MG tablet Take 40 mg by mouth daily. 09/04/21   [provider]  furosemide (LASIX) 40 MG tablet Take 1 tablet (40 mg total) by mouth daily after breakfast. [id number]   Delora Fuel, MD  glucose blood (ONE TOUCH ULTRA TEST) test strip Use to check blood sugar 3 times daily Dx code E11.65 Patient taking differently: 1 each by Other route See admin instructions. Use to check blood sugar 3 times daily Dx code E11.65 11/20/14   Elayne Snare, MD  guaiFENesin (ROBITUSSIN) 100 MG/5ML liquid Take 5-10 mLs (100-200 mg total) by mouth every 6 (six) hours as needed for cough or to loosen phlegm. Patient not taking: Reported on 08/30/2022 07/21/22   Domenic Moras, PA-C  lisinopril (ZESTRIL) 40 MG tablet Take 40 mg by mouth daily. 06/17/22   [provider]  omeprazole (PRILOSEC) 40 MG capsule Take 40 mg by mouth daily. 03/10/22   [provider]  ONETOUCH VERIO test strip CHECK BLOOD SUGAR THREE TIMES DAILY Patient taking differently: 1  each by Other route in the morning, at noon, and at bedtime. 03/30/18   Elayne Snare, MD  OZEMPIC, 0.25 OR 0.5 MG/DOSE, 2 MG/1.5ML SOPN Inject 0.25 mg into the skin once a week. Sundays 05/12/21   [provider]  polyethylene glycol (MIRALAX / GLYCOLAX) 17 g packet Take 17 g by mouth daily. Patient not taking: Reported on 10/25/2021 09/28/21   Eugenie Filler, MD  rosuvastatin (CRESTOR) 20 MG tablet Take 20 mg by mouth daily.    [provider]  sucralfate (CARAFATE) 1 g tablet Take 1 tablet (1 g total) by mouth 4 (four) times daily -  with meals and at bedtime. Patient not taking: Reported on 10/19/2022 05/23/22   Montine Circle, PA-C    Inpatient Medications: Scheduled Meds:  Continuous Infusions:  PRN Meds:   Allergies:    Allergies  Allergen Reactions    Hydralazine Hcl Itching    ENTIRE BODY = burning sensation, also in the eyes (they have become very sensitive to light)   Atorvastatin Other (See Comments)    Per MD - pt not sure of reaction     Social History:   Social History   Socioeconomic History   Marital status: Single    Spouse name: Not on file   Number of children: 0   Years of education: Not on file   Highest education level: Not on file  Occupational History    Employer: [employer]  Tobacco Use   Smoking status: Never   Smokeless tobacco: Never  Vaping Use   Vaping Use: Never used  Substance and Sexual Activity   Alcohol use: No   Drug use: No   Sexual activity: Not on file  Other Topics Concern   Not on file  Social History Narrative   Works at [employer]   Patient lives at home alone.    Patient has no children.    Patient patient is right handed.    Patient is single.    Social Determinants of Health   Financial Resource Strain: Not on file  Food Insecurity: Not on file  Transportation Needs: Not on file  Physical Activity: Not on file  Stress: Not on file  Social Connections: Not on file  Intimate Partner Violence: Not on file    Family History:    Family History  Problem Relation Age of Onset   Diabetes Father    Hypertension Father    Hypertension Maternal Grandfather      ROS:  Please see the history of present illness.   All other ROS reviewed and negative.     Physical Exam/Data:   Vitals:   11/05/22 0700 11/05/22 0715 11/05/22 0717 11/05/22 0730  BP:      Pulse: 61 62  63  Resp: 19 16  (!) 32  Temp:   98.6 F (37 C)   TempSrc:   Oral   SpO2: 100% 100%  100%  Weight:      Height:       No intake or output data in the 24 hours ending 11/05/22 0835    11/04/2022    8:48 PM 10/10/2022    3:49 PM 09/20/2022    9:37 PM  Last 3 Weights  Weight (lbs) 165 lb 165 lb 5.5 oz 165 lb 5.5 oz  Weight (kg) 74.844 kg 75 kg 75 kg     Body mass index is 25.84 kg/m.  General:  Elderly female. Appears anxious but no acute distress  HEENT: normal Neck: no JVD Vascular: Radial pulses 2+ bilaterally Cardiac:  normal S1, S2; RRR; faint systolic murmur at RUSB  Lungs:  clear to auscultation bilaterally, no wheezing, rhonchi or rales. Normal WOB on room air  Abd: soft, nontender, no hepatomegaly  Ext: 1+ edema in BLE  Musculoskeletal:  No deformities, BUE and BLE strength normal and equal Skin: warm and dry  Neuro:  CNs 2-12 intact, no focal abnormalities noted Psych:  Normal affect   EKG:  The EKG was personally reviewed and demonstrates:  Sinus rhythm, RBBB Telemetry:  Telemetry was personally reviewed and demonstrates:  NSR   Relevant CV Studies:   Laboratory Data:  High Sensitivity Troponin:   Recent Labs  Lab 10/18/22 2327 11/02/22 1700 11/02/22 2026 11/04/22 2129 11/05/22 0429  TROPONINIHS 43* 68* 60* 49* 54*     Chemistry Recent Labs  Lab 11/02/22 1700 11/04/22 2129  NA 143 139  K 4.4 4.0  CL 107 104  CO2 21* 24  GLUCOSE 109* 113*  BUN 39* 38*  CREATININE 4.61* 4.61*  CALCIUM 8.9 8.8*  MG 1.9  --   GFRNONAA 10* 10*  ANIONGAP 15 11    Recent Labs  Lab 11/02/22 1700  PROT 6.4*  ALBUMIN 3.2*  AST 19  ALT 11  ALKPHOS 51  BILITOT 0.3   Lipids No results for input(s): "CHOL", "TRIG", "HDL", "LABVLDL", "LDLCALC", "CHOLHDL" in the last 168 hours.  Hematology Recent Labs  Lab 11/02/22 1700 11/04/22 2129  WBC 5.6 5.5  RBC 3.40* 3.26*  HGB 9.6* 9.1*  HCT 30.1* 29.1*  MCV 88.5 89.3  MCH 28.2 27.9  MCHC 31.9 31.3  RDW 13.9 13.7  PLT 238 228   Thyroid No results for input(s): "TSH", "FREET4" in the last 168 hours.  BNPNo results for input(s): "BNP", "PROBNP" in the last 168 hours.  DDimer No results for input(s): "DDIMER" in the last 168 hours.   Radiology/Studies:  CT Head Wo Contrast  Result Date: 11/04/2022 CLINICAL DATA:  Headache EXAM: CT HEAD WITHOUT CONTRAST TECHNIQUE: Contiguous axial images were obtained from  the base of the skull through the vertex without intravenous contrast. RADIATION DOSE REDUCTION: This exam was performed according to the departmental dose-optimization program which includes automated exposure control, adjustment of the mA and/or kV according to patient size and/or use of iterative reconstruction technique. COMPARISON:  CT head 10/18/2022.  MRI head 04/03/2022. FINDINGS: Brain: No evidence of acute infarction, hemorrhage, hydrocephalus, extra-axial collection or mass lesion/mass effect. Vascular: Atherosclerotic calcifications are present within the cavernous internal carotid arteries. Skull: Normal. Negative for fracture or focal lesion. Sinuses/Orbits: No acute finding. Other: None. IMPRESSION: No acute intracranial abnormality. Electronically Signed   By: Ronney Asters M.D.   On: 11/04/2022 22:08   DG Chest 2 View  Result Date: 11/04/2022 CLINICAL DATA:  chest pains EXAM: CHEST - 2 VIEW COMPARISON:  Chest x-ray 11/02/2022, chest x-ray 10/18/2022, chest x-ray 05/14/2022 FINDINGS: The heart and mediastinal contours are unchanged. Aortic calcification. Question developing airspace/interstitial opacity overlying the left upper lung zone. No pulmonary edema. No pleural effusion. No pneumothorax. No acute osseous abnormality. IMPRESSION: 1. Question developing airspace/interstitial opacity overlying the left upper lung zone. 2.  Aortic Atherosclerosis (ICD10-I70.0). Electronically Signed   By: Iven Finn M.D.   On: 11/04/2022 21:32   DG Chest 2 View  Result Date: 11/02/2022 CLINICAL DATA:  Chest pain, RIGHT arm pain, congestion EXAM: CHEST - 2 VIEW COMPARISON:  10/18/2022 FINDINGS: Normal heart size, mediastinal contours, and pulmonary  vascularity. Atherosclerotic calcification aorta. Lungs clear. No pulmonary infiltrate, pleural effusion, or pneumothorax. Osseous structures unremarkable. IMPRESSION: No acute abnormalities. Aortic Atherosclerosis (ICD10-I70.0). Electronically Signed   By:  Lavonia Dana M.D.   On: 11/02/2022 17:24     Assessment and Plan:   Elevated troponins Chest Pain  - High-sensitivity troponin 49>54.  Of note, on 3/11, hsTn was 68>60 - BP was elevated to 214/84 on arrival to the ED. Creatinine 4.61 - Patient does complain of chest pain, but it is very atypical.  Chest pain is present at rest and has been constant.  Initially described as a stabbing/pounding sensation, and now feels like pressure - Suspect that chest pain is secondary to severely elevated blood pressure.  Trop elevation is likely type II MI in the setting of elevated BP, worsening kidney function - No need for IV heparin at this time.  Echocardiogram ordered to assess EF and wall motion  Hypertensive emergency - BP initially elevated to 214/84.  More recently, 173/71 at 0615 - Continue home carvedilol 25 mg BID, lisinopril 40 mg daily. One time dose of clonidine ordered per primary  - Workup per primary   History of nonischemic cardiomyopathy - Echocardiogram in 2015 showed EF 30-35%.  Patient underwent heart catheterization in 2015 that showed nonobstructive CAD with serial 20% proximal and mid LAD stenosis, 30% proximal left circumflex stenosis, 30% mid AV groove circumflex stenosis. - Most recent echocardiogram from 03/2022 showed EF 55-60%, no regional wall motion abnormalities - Patient euvolemic on exam. CXR without pulmonary edema or pleural effusions  - Continue carvedilol, lisinopril  - Repeat echocardiogram pending  CKD stage IV - Creatinine elevated to 4.61. Appears that baseline is around 3.3-3.7  - Reports seeing Dr. Joelyn Oms with nephrology, may benefit from nephrology consult if creatinine continues to worsen   History of nonobstructive CAD - Noted on heart catheterization in 2015 -Continue carvedilol, aspirin, Crestor   Risk Assessment/Risk Scores:   For questions or updates, please contact Teresa Poole Please consult [email or web address] for contact info under   Signed, Teresa Billet, PA-C  11/05/2022 8:35 AM  Patient seen and examined, note reviewed with the signed Advanced Practice Provider. I personally reviewed laboratory data, imaging studies and relevant notes. I independently examined the patient and formulated the important aspects of the plan. I have personally discussed the plan with the patient and/or family. Comments or changes to the note/plan are indicated below. Patient seen examined her bedside.  Her sister is by the bedside when I arrived. She offers no complaints at this time.  Elevated troponin Chest pain-resolved Hypertensive emergency History of nonischemic cardiomyopathy Chronic kidney disease stage IV History of nonobstructive CAD  She has had significant improvement with her blood pressure since presenting but still is elevated.  Therefore her medication regimen will need to be optimized.  She is currently on Coreg 25 mg twice daily, lisinopril 40 mg daily.  She was given 1 dose of clonidine in the ED.  Her sister was present in the room also shares with me that patient may not be taking the medication as prescribed.  Though she is not intentionally noncompliant but she has recently been diagnosed with memory loss.  Despite that her blood pressure is still really elevated for just 2 antihypertensive medications.  In the setting of her worsening kidney function I would recommend stopping the lisinopril.  Please start amlodipine 10 mg daily-she has had significant itching with hydralazine so we will hold off on adding this medication.  Will reassess the need for adding additional antihypertensive after she restarted her amlodipine with her follow-up blood pressures.  Echo in August 2023 showed normal EF this is good.  Will get a repeat echo for now.  Discussed my concern to the patient's history of bowel help with getting medications.  Her sister who is retired says that is willing to help the patient take her medications  and follow through to make sure that she is not missing them.  With the elevated troponin I suspect this was in the setting of her hypertensive emergency.  It has been flat.  No significant anginal symptoms.  For now need to pursue ischemic evaluation.  An echocardiogram will help [place] if there is any wall motion abnormalities.  But less help with controlling her blood pressure in the meantime.  Chronic kidney disease does appear that her creatinine is worsening.  She will benefit from a nephrology consult while in the hospital.  Berniece Salines DO, [place] Attending Cardiologist [place]  [address] [place], [place] [address] [phone] Website: [email or web address]

## 2022-11-05 NOTE — H&P (Signed)
History and Physical  Teresa Poole [id number] DOB: 1951/07/23 DOA: 11/04/2022   PCP: [name], MD   Patient coming from: Home   Chief Complaint: HTN   HPI: Teresa Poole is a 72 y.o. female with medical history significant for obesity, combined chronic systolic and diastolic congestive heart failure, CKD stage IV baseline creatinine about 3, history of renal artery stenosis status post stent about 5 years ago, the admitted to the hospital with uncontrolled hypertension and atypical chest pain.  Patient lives alone, reported to have some memory issues according to her sister who is with her at the bedside in the ER this morning.  However she is able to give me a decent history, says that she takes blood pressure medicine at home usually it is decently well-controlled, however she does check it regularly and yesterday afternoon to check her blood pressure was quite high.  However, when asked how high it was, she tells me it was about 150.  In any case, she says that at that time she was feeling okay and not having any other symptoms.  Later in the evening, she went over to her sister's house, sister states that when she got home from work her sister was there, and stated that her blood pressure was high, they checked her blood pressure, and systolic was over [id number], diastolic was according to the sister also close to 200.  Therefore they decided to come to the ER for evaluation.  In the meantime, earlier in the afternoon, the patient states that she started having some left neck discomfort, and she also started to feel some pain in her bilateral ears, associated with a washing machine sound in her left ear.  She has never had this before.  ED Course: Upon presentation to the emergency department, she was saturating well on room air, pulse 75, heart rate 80, respirations 18 with blood pressure 214/84.  She was given IV labetalol and blood pressure improved overall.  Lab work was performed  in the emergency department as well, which showed unremarkable CBC at her baseline, creatinine up to 4.6 from her baseline of about 3-3.5, electrolytes unremarkable.  She also had CT scan of the head which was unremarkable.  Troponin was also checked, last month, troponin was 20, on a repeat ER visit her troponin was 43, peaked at 60 on 3/11, and this morning her troponin was 54.  Case was discussed by ER provider with cardiology fellow overnight, who declined admission and felt troponin elevation was due to uncontrolled hypertension in the setting of acute renal failure.  In any case, hospitalist was contacted for admission for the patient.  Currently she is resting comfortably but blood pressures once again uncontrolled.  Review of Systems: Please see HPI for pertinent positives and negatives. A complete 10 system review of systems are otherwise negative.  Past Medical History:  Diagnosis Date   Allergy    Anemia    Carotid stenosis    Chronic combined systolic and diastolic CHF (congestive heart failure) (HCC)    CKD (chronic kidney disease), stage IV ([place]) 09/24/2013   Pt at [facility], Dr. Joelyn Oms   COVID-19 10/24/2021   Diabetes mellitus type 2 in nonobese [facility])    Edema 09/14/2013   GERD (gastroesophageal reflux disease)    History of ovarian cancer    Hyperlipidemia    Hypertension    Mild CAD    non-obstructive by LHC (09/25/2013): Proximal and mid LAD serial 20%, proximal circumflex 30%,  mid AV groove circumflex 30%, mid RCA mild plaque.   NICM (nonischemic cardiomyopathy) (HCC)    Obesity (BMI 30-39.9)    RBBB    Renal artery stenosis ([place]) 10/11/2018   Thyroid disease    Seen by specialist   Past Surgical History:  Procedure Laterality Date   LEFT HEART CATHETERIZATION WITH CORONARY ANGIOGRAM N/A 09/25/2013   Procedure: LEFT HEART CATHETERIZATION WITH CORONARY ANGIOGRAM;  Surgeon: Burnell Blanks, MD;  Location: [facility] CATH LAB;  Service: Cardiovascular;  Laterality:  N/A;   RENAL ANGIOGRAPHY N/A 10/06/2018   Procedure: RENAL ANGIOGRAPHY;  Surgeon: Marty Heck, MD;  Location: [facility] CV LAB;  Service: Cardiovascular;  Laterality: N/A;    Social History:  reports that she has never smoked. She has never used smokeless tobacco. She reports that she does not drink alcohol and does not use drugs.   Allergies  Allergen Reactions   Hydralazine Hcl Itching    ENTIRE BODY = burning sensation, also in the eyes (they have become very sensitive to light)   Atorvastatin Other (See Comments)    Per MD - pt not sure of reaction     Family History  Problem Relation Age of Onset   Diabetes Father    Hypertension Father    Hypertension Maternal Grandfather      Prior to Admission medications   Medication Sig Start Date End Date Taking? Authorizing Provider  carvedilol (COREG) 25 MG tablet Take 25 mg by mouth 2 (two) times daily with a meal.    [provider]  famotidine (PEPCID) 40 MG tablet Take 40 mg by mouth daily. 09/04/21   [provider]  furosemide (LASIX) 40 MG tablet Take 1 tablet (40 mg total) by mouth daily after breakfast. [id number]   Delora Fuel, MD  glucose blood (ONE TOUCH ULTRA TEST) test strip Use to check blood sugar 3 times daily Dx code E11.65 Patient taking differently: 1 each by Other route See admin instructions. Use to check blood sugar 3 times daily Dx code E11.65 11/20/14   Elayne Snare, MD  guaiFENesin (ROBITUSSIN) 100 MG/5ML liquid Take 5-10 mLs (100-200 mg total) by mouth every 6 (six) hours as needed for cough or to loosen phlegm. Patient not taking: Reported on 08/30/2022 07/21/22   Domenic Moras, PA-C  lisinopril (ZESTRIL) 40 MG tablet Take 40 mg by mouth daily. 06/17/22   [provider]  omeprazole (PRILOSEC) 40 MG capsule Take 40 mg by mouth daily. 03/10/22   [provider]  ONETOUCH VERIO test strip [place] Patient taking differently: 1 each by Other route  in the morning, at noon, and at bedtime. 03/30/18   Elayne Snare, MD  OZEMPIC, 0.25 OR 0.5 MG/DOSE, 2 MG/1.5ML SOPN Inject 0.25 mg into the skin once a week. Sundays 05/12/21   [provider]  polyethylene glycol (MIRALAX / GLYCOLAX) 17 g packet Take 17 g by mouth daily. Patient not taking: Reported on 10/25/2021 09/28/21   Eugenie Filler, MD  rosuvastatin (CRESTOR) 20 MG tablet Take 20 mg by mouth daily.    [provider]  sucralfate (CARAFATE) 1 g tablet Take 1 tablet (1 g total) by mouth 4 (four) times daily -  with meals and at bedtime. Patient not taking: Reported on 10/19/2022 05/23/22   Montine Circle, PA-C    Physical Exam: BP (!) 173/71   Pulse 63   Temp 98.6 F (37 C) (Oral)   Resp (!) 32   Ht  $'5\' 7"'v$  (1.702 m)   Wt 74.8 kg   SpO2 100%   BMI 25.84 kg/m   General:  Alert, oriented, calm, in no acute distress, appears her stated age and is quite comfortable. Eyes: EOMI, clear conjuctivae, white sclerea Neck: supple, no masses, trachea mildline  Cardiovascular: RRR, no murmurs or rubs, no peripheral edema  Respiratory: clear to auscultation bilaterally, no wheezes, no crackles  Abdomen: soft, nontender, nondistended, normal bowel tones heard  Skin: dry, no rashes  Musculoskeletal: no joint effusions, normal range of motion  Psychiatric: appropriate affect, normal speech  Neurologic: extraocular muscles intact, clear speech, moving all extremities with intact sensorium          Labs on Admission:  Basic Metabolic Panel: Recent Labs  Lab 11/02/22 1700 11/04/22 2129  NA 143 139  K 4.4 4.0  CL 107 104  CO2 21* 24  GLUCOSE 109* 113*  BUN 39* 38*  CREATININE 4.61* 4.61*  CALCIUM 8.9 8.8*  MG 1.9  --    Liver Function Tests: Recent Labs  Lab 11/02/22 1700  AST 19  ALT 11  ALKPHOS 51  BILITOT 0.3  PROT 6.4*  ALBUMIN 3.2*   No results for input(s): "LIPASE", "AMYLASE" in the last 168 hours. No results for input(s): "AMMONIA" in the last 168  hours. CBC: Recent Labs  Lab 11/02/22 1700 11/04/22 2129  WBC 5.6 5.5  NEUTROABS 3.5  --   HGB 9.6* 9.1*  HCT 30.1* 29.1*  MCV 88.5 89.3  PLT 238 228   Cardiac Enzymes: No results for input(s): "CKTOTAL", "CKMB", "CKMBINDEX", "TROPONINI" in the last 168 hours.  BNP (last 3 results) Recent Labs    03/07/22 1215 08/30/22 1208  BNP 27.6 58.5    ProBNP (last 3 results) No results for input(s): "PROBNP" in the last 8760 hours.  CBG: No results for input(s): "GLUCAP" in the last 168 hours.  Radiological Exams on Admission: CT Head Wo Contrast  Result Date: 11/04/2022 CLINICAL DATA:  Headache EXAM: CT HEAD WITHOUT CONTRAST TECHNIQUE: Contiguous axial images were obtained from the base of the skull through the vertex without intravenous contrast. RADIATION DOSE REDUCTION: This exam was performed according to the departmental dose-optimization program which includes automated exposure control, adjustment of the mA and/or kV according to patient size and/or use of iterative reconstruction technique. COMPARISON:  CT head 10/18/2022.  MRI head 04/03/2022. FINDINGS: Brain: No evidence of acute infarction, hemorrhage, hydrocephalus, extra-axial collection or mass lesion/mass effect. Vascular: Atherosclerotic calcifications are present within the cavernous internal carotid arteries. Skull: Normal. Negative for fracture or focal lesion. Sinuses/Orbits: No acute finding. Other: None. IMPRESSION: No acute intracranial abnormality. Electronically Signed   By: Ronney Asters M.D.   On: 11/04/2022 22:08   DG Chest 2 View  Result Date: 11/04/2022 CLINICAL DATA:  chest pains EXAM: CHEST - 2 VIEW COMPARISON:  Chest x-ray 11/02/2022, chest x-ray 10/18/2022, chest x-ray 05/14/2022 FINDINGS: The heart and mediastinal contours are unchanged. Aortic calcification. Question developing airspace/interstitial opacity overlying the left upper lung zone. No pulmonary edema. No pleural effusion. No pneumothorax. No  acute osseous abnormality. IMPRESSION: 1. Question developing airspace/interstitial opacity overlying the left upper lung zone. 2.  Aortic Atherosclerosis (ICD10-I70.0). Electronically Signed   By: Iven Finn M.D.   On: 11/04/2022 21:32    Assessment/Plan Principal Problem: Malignant hypertension, this is likely the source of her chest discomfort.  No evidence of MI, pain is certainly very atypical, and troponin elevation is most likely type II ACS from severe  uncontrolled hypertension in the setting of acute on chronic renal failure. -Inpatient admission -Troponin has been trended and relatively flat -Discussed with cardiology, who will consult -Check 2D echo to rule out wall motion abnormality, she does have known hypertensive cardiomyopathy -Will check bilateral carotid Dopplers -Note the patient has a history of renal artery stenosis -In the meantime we will continue her home medication including carvedilol, lisinopril -Will give 1 p.o. dose of clonidine x 1 now, hydralazine IV as needed SBP over 180 thereafter -Also continue baby aspirin in the meantime Active Problems:   (HFpEF) heart failure with preserved ejection fraction (HCC) -currently no clinical evidence of heart failure exacerbation   Hypertension associated with diabetes (HCC)   Chronic combined systolic and diastolic heart failure (HCC)   Type 2 diabetes mellitus with chronic kidney disease, without long-term current use of insulin ([place]) -diabetic diet when eating, with sliding scale.  Patient is not insulin-dependent at baseline.   Hyperlipidemia associated with type 2 diabetes mellitus ([place]) -continue statin   Hypertensive urgency   Cardiomyopathy, hypertensive ([place])   Anemia of chronic kidney failure, stage 4 (severe) (HCC)   Renal artery stenosis (HCC)  DVT prophylaxis: HepSQ   Code Status: Full   Family Communication: Plan of care was discussed  in detail with the patient and her family at the bedside this  morning in the ER at the time of admission.  Consults called: Cardiology  Admission status: Inpatient  Time spent: 46 minutes  Jayleah Garbers Neva Seat MD [facility] Pager [phone]  If 7PM-7AM, please contact night-coverage [email or web address] Password [facility]  11/05/2022, 8:46 AM

## 2022-11-05 NOTE — ED Notes (Signed)
Pt standing at BS, c/o BLE full leg cramps/ spasms. Trying to stretch legs at [facility].

## 2022-11-05 NOTE — ED Notes (Signed)
ED TO INPATIENT HANDOFF REPORT  Name/Age/Gender Teresa Poole 72 y.o. female  Code Status    Code Status Orders  (From admission, onward)           Start     Ordered   11/05/22 0843  Full code  Continuous       Question:  By:  Answer:  Consent: discussion documented in EHR   11/05/22 0844           Code Status History     Date Active Date Inactive Code Status Order ID Comments User Context   04/17/2022 2318 04/18/2022 1800 Full Code [id number]  Rick Duff, MD ED   02/28/2022 1959 03/01/2022 1736 Full Code [id number]  Lenore Cordia, MD ED   10/25/2021 1911 10/28/2021 2006 Full Code [id number]  Orma Flaming, MD Inpatient   10/17/2021 0040 10/17/2021 1943 Full Code [id number]  Sherrill Raring, PA-C ED   10/14/2021 2158 10/15/2021 1332 DNR [id number]  Rise Patience, MD Inpatient   06/10/2021 1155 06/11/2021 2111 DNR [id number]  [name], MD Inpatient   06/10/2021 1147 06/10/2021 1154 Full Code [id number]  [name], MD Inpatient   11/27/2018 0919 11/28/2018 1426 Full Code [id number]  Barb Merino, MD ED   10/04/2018 1816 10/07/2018 2123 Full Code [id number]  Lady Deutscher, MD ED   09/18/2013 1725 09/26/2013 1707 Full Code [id number]  Burnell Blanks, MD ED   09/07/2013 1837 09/10/2013 1412 Full Code [id number]  Dhungel, Flonnie Overman, MD Inpatient      Advance Directive Documentation    Flowsheet Row Most Recent Value  Type of Advance Directive Living will  Pre-existing out of facility DNR order (yellow form or pink MOST form) --  "MOST" Form in Place? --       Home/SNF/Other Home  Chief Complaint Acute kidney injury ([place]) [N17.9]  Level of Care/Admitting Diagnosis ED Disposition     ED Disposition  Admit   Condition  --   [place]: [place] [[id number]]  Level of Care: Progressive [102]  Admit to Progressive based on following criteria: NEPHROLOGY stable condition requiring close monitoring for AKI, requiring  Hemodialysis or Peritoneal Dialysis either from expected electrolyte imbalance, acidosis, or fluid overload that can be managed by NIPPV or high flow oxygen.  May admit patient to [facility] or [facility] if equivalent level of care is available:: Yes  Covid Evaluation: Confirmed COVID Negative  Diagnosis: Acute kidney injury [facility]) [id number]  Admitting Physician: Jackelyn Knife [id number]  Attending Physician: Shanon Rosser [id number]  Certification:: I certify this patient will need inpatient services for at least 2 midnights  Estimated Length of Stay: 4          Medical History Past Medical History:  Diagnosis Date   Allergy    Anemia    Carotid stenosis    Chronic combined systolic and diastolic CHF (congestive heart failure) ([place])    CKD (chronic kidney disease), stage IV ([place]) 09/24/2013   Pt at [place], Dr. Joelyn Oms   COVID-19 10/24/2021   Diabetes mellitus type 2 in nonobese [facility])    Edema 09/14/2013   GERD (gastroesophageal reflux disease)    History of ovarian cancer    Hyperlipidemia    Hypertension    Mild CAD    non-obstructive by LHC (09/25/2013): Proximal and mid LAD serial 20%, proximal circumflex 30%, mid AV groove circumflex 30%, mid RCA mild plaque.   NICM (nonischemic cardiomyopathy) (HCC)    Obesity (BMI 30-39.9)  RBBB    Renal artery stenosis ([place]) 10/11/2018   Thyroid disease    Seen by specialist    Allergies Allergies  Allergen Reactions   Hydralazine Hcl Itching    ENTIRE BODY = burning sensation, also in the eyes (they have become very sensitive to light)   Atorvastatin Other (See Comments)    Per MD - pt not sure of reaction     IV Location/Drains/Wounds Patient Lines/Drains/Airways Status     Active Line/Drains/Airways     Name Placement date Placement time Site Days   Peripheral IV 11/05/22 20 G Anterior;Left;Proximal Forearm 11/05/22  0435  Forearm  less than 1            Labs/Imaging Results for orders placed or  performed during the hospital encounter of 11/04/22 (from the past 48 hour(s))  Basic metabolic panel     Status: Abnormal   Collection Time: 11/04/22  9:29 PM  Result Value Ref Range   Sodium 139 135 - 145 mmol/L   Potassium 4.0 3.5 - 5.1 mmol/L   Chloride 104 98 - 111 mmol/L   CO2 24 22 - 32 mmol/L   Glucose, Bld 113 (H) 70 - 99 mg/dL    Comment: Glucose reference range applies only to samples taken after fasting for at least 8 hours.   BUN 38 (H) 8 - 23 mg/dL   Creatinine, Ser 4.61 (H) 0.44 - 1.00 mg/dL   Calcium 8.8 (L) 8.9 - 10.3 mg/dL   GFR, Estimated 10 (L) >60 mL/min    Comment: (NOTE) Calculated using the CKD-EPI Creatinine Equation (2021)    Anion gap 11 5 - 15    Comment: Performed at [facility], [place] [address] [place], [place] [address]  CBC     Status: Abnormal   Collection Time: 11/04/22  9:29 PM  Result Value Ref Range   WBC 5.5 4.0 - 10.5 K/uL   RBC 3.26 (L) 3.87 - 5.11 MIL/uL   Hemoglobin 9.1 (L) 12.0 - 15.0 g/dL   HCT 29.1 (L) 36.0 - 46.0 %   MCV 89.3 80.0 - 100.0 fL   MCH 27.9 26.0 - 34.0 pg   MCHC 31.3 30.0 - 36.0 g/dL   RDW 13.7 11.5 - 15.5 %   Platelets 228 150 - 400 K/uL   nRBC 0.0 0.0 - 0.2 %    Comment: Performed at [facility], [place] [address], [place], [place] [address]  Troponin I (High Sensitivity)     Status: Abnormal   Collection Time: 11/04/22  9:29 PM  Result Value Ref Range   Troponin I (High Sensitivity) 49 (H) <18 ng/L    Comment: (NOTE) Elevated high sensitivity troponin I (hsTnI) values and significant  changes across serial measurements may suggest ACS but many other  chronic and acute conditions are known to elevate hsTnI results.  Refer to the "Links" section for chest pain algorithms and additional  guidance. Performed at [facility], [place] [address] [place], [place] [address]   Urinalysis, Routine w reflex microscopic -Urine, Clean Catch     Status: Abnormal    Collection Time: 11/04/22  9:29 PM  Result Value Ref Range   Color, Urine COLORLESS (A) YELLOW   APPearance CLEAR CLEAR   Specific Gravity, Urine 1.004 (L) 1.005 - 1.030   pH 6.0 5.0 - 8.0   Glucose, UA NEGATIVE NEGATIVE mg/dL   Hgb urine dipstick SMALL (A) NEGATIVE   Bilirubin Urine NEGATIVE NEGATIVE  Ketones, ur NEGATIVE NEGATIVE mg/dL   Protein, ur 100 (A) NEGATIVE mg/dL   Nitrite NEGATIVE NEGATIVE   Leukocytes,Ua NEGATIVE NEGATIVE   RBC / HPF 0-5 0 - 5 RBC/hpf   WBC, UA 0-5 0 - 5 WBC/hpf   Bacteria, UA NONE SEEN NONE SEEN   Squamous Epithelial / HPF 0-5 0 - 5 /HPF   Mucus PRESENT     Comment: Performed at [facility], [place] [address], [place], [place] [address]  Troponin I (High Sensitivity)     Status: Abnormal   Collection Time: 11/05/22  4:29 AM  Result Value Ref Range   Troponin I (High Sensitivity) 54 (H) <18 ng/L    Comment: (NOTE) Elevated high sensitivity troponin I (hsTnI) values and significant  changes across serial measurements may suggest ACS but many other  chronic and acute conditions are known to elevate hsTnI results.  Refer to the "Links" section for chest pain algorithms and additional  guidance. Performed at [facility], [place] [address] [place], [place] [address]   HIV Antibody (routine testing w rflx)     Status: None   Collection Time: 11/05/22 11:00 AM  Result Value Ref Range   HIV Screen 4th Generation wRfx Non Reactive Non Reactive    Comment: Performed at [facility], [place] [address], [place], [place] [address]  CBC     Status: Abnormal   Collection Time: 11/05/22 11:00 AM  Result Value Ref Range   WBC 4.7 4.0 - 10.5 K/uL   RBC 3.06 (L) 3.87 - 5.11 MIL/uL   Hemoglobin 8.6 (L) 12.0 - 15.0 g/dL   HCT 27.3 (L) 36.0 - 46.0 %   MCV 89.2 80.0 - 100.0 fL   MCH 28.1 26.0 - 34.0 pg   MCHC 31.5 30.0 - 36.0 g/dL   RDW 13.9 11.5 - 15.5 %   Platelets 202 150 - 400 K/uL   nRBC 0.0 0.0 - 0.2 %    Comment:  Performed at [facility], [place] [address], [place], [place] [address]  Creatinine, serum     Status: Abnormal   Collection Time: 11/05/22 11:00 AM  Result Value Ref Range   Creatinine, Ser 4.71 (H) 0.44 - 1.00 mg/dL   GFR, Estimated 9 (L) >60 mL/min    Comment: (NOTE) Calculated using the CKD-EPI Creatinine Equation (2021) Performed at [facility], [place] [address], [place], [place] [address]   CBG monitoring, ED     Status: Abnormal   Collection Time: 11/05/22 12:15 PM  Result Value Ref Range   Glucose-Capillary 119 (H) 70 - 99 mg/dL    Comment: Glucose reference range applies only to samples taken after fasting for at least 8 hours.  CBG monitoring, ED     Status: Abnormal   Collection Time: 11/05/22  4:11 PM  Result Value Ref Range   Glucose-Capillary 119 (H) 70 - 99 mg/dL    Comment: Glucose reference range applies only to samples taken after fasting for at least 8 hours.   VAS US CAROTID  Result Date: 11/05/2022 Carotid Arterial Duplex Study Patient Name:  Teresa Poole  Date of Exam:   11/05/2022 Medical Rec #: [id number]           Accession #:    [id number] Date of Birth: [birth year]           Patient Gender: F Patient Age:   63 years Exam Location:  [facility] Procedure:  VAS US CAROTID Referring Phys: MIR [facility] --------------------------------------------------------------------------------  Indications:       History of left ICA stenosis, left neck and ear pain. Risk Factors:      Hypertension, hyperlipidemia, Diabetes, no history of                    smoking, mild coronary artery disease. Other Factors:     Renal artery stenosis. Comparison Study:  07-01-2022 MRA Neck indicating approximately 50% left ICA                    stenosis and severe left vertebral artery stenosis. Performing Technologist: Darlin Coco RDMS, RVT  Examination Guidelines: A complete evaluation includes B-mode imaging, spectral Doppler, color Doppler,  and power Doppler as needed of all accessible portions of each vessel. Bilateral testing is considered an integral part of a complete examination. Limited examinations for reoccurring indications may be performed as noted.  Right Carotid Findings: +----------+--------+--------+--------+-------------------+--------------------+           PSV cm/sEDV cm/sStenosisPlaque Description Comments             +----------+--------+--------+--------+-------------------+--------------------+ CCA Prox  76      12                                                      +----------+--------+--------+--------+-------------------+--------------------+ CCA Distal81      11                                 intimal thickening   +----------+--------+--------+--------+-------------------+--------------------+ ICA Prox  166     37      1-39%   calcific and       Upper range-                                           heterogenous       borderline 40-59%    +----------+--------+--------+--------+-------------------+--------------------+ ICA Mid   104     26                                                      +----------+--------+--------+--------+-------------------+--------------------+ ICA Distal103     29                                                      +----------+--------+--------+--------+-------------------+--------------------+ +----------+--------+-------+---------+-------------------+           PSV cm/sEDV cmsDescribe Arm Pressure (mmHG) +----------+--------+-------+---------+-------------------+ Subclavian70             Turbulent                    +----------+--------+-------+---------+-------------------+ +---------+--------+---+--------+--+---------+ VertebralPSV cm/s117EDV cm/s23Antegrade +---------+--------+---+--------+--+---------+  Left Carotid Findings: +----------+--------+--------+--------+----------------------------+-----------+           PSV  cm/sEDV cm/sStenosisPlaque Description          Comments    +----------+--------+--------+--------+----------------------------+-----------+  CCA Prox  92      14                                                      +----------+--------+--------+--------+----------------------------+-----------+ CCA Distal103     20      <50%    heterogenous                            +----------+--------+--------+--------+----------------------------+-----------+ ICA Prox  213     51      40-59%  calcific, heterogenous and  Upper range                                   irregular                               +----------+--------+--------+--------+----------------------------+-----------+ ICA Mid   145     29                                                      +----------+--------+--------+--------+----------------------------+-----------+ ICA Distal150     32                                                      +----------+--------+--------+--------+----------------------------+-----------+ ECA       64                                                              +----------+--------+--------+--------+----------------------------+-----------+ +----------+--------+--------+---------+-------------------+           PSV cm/sEDV cm/sDescribe Arm Pressure (mmHG) +----------+--------+--------+---------+-------------------+ [id number]             Turbulent                    +----------+--------+--------+---------+-------------------+ +---------+--------+--+--------+--+---------+ VertebralPSV cm/s47EDV cm/s11Antegrade +---------+--------+--+--------+--+---------+   Summary: Right Carotid: Velocities in the right ICA are consistent with a 1-39% stenosis. Left Carotid: Velocities in the left ICA are consistent with a 40-59% stenosis.               Non-hemodynamically significant plaque <50% noted in the CCA. Vertebrals:  Bilateral vertebral arteries demonstrate  antegrade flow. Subclavians: Bilateral subclavian artery flow was disturbed. *See table(s) above for measurements and observations.  Electronically signed by Antony Contras MD on 11/05/2022 at 12:13:57 PM.    Final    CT Head Wo Contrast  Result Date: 11/04/2022 CLINICAL DATA:  Headache EXAM: CT HEAD WITHOUT CONTRAST TECHNIQUE: Contiguous axial images were obtained from the base of the skull through the vertex without intravenous contrast. RADIATION DOSE REDUCTION: This exam was performed according to the departmental dose-optimization program which includes automated exposure control, adjustment of the mA and/or  kV according to patient size and/or use of iterative reconstruction technique. COMPARISON:  CT head 10/18/2022.  MRI head 04/03/2022. FINDINGS: Brain: No evidence of acute infarction, hemorrhage, hydrocephalus, extra-axial collection or mass lesion/mass effect. Vascular: Atherosclerotic calcifications are present within the cavernous internal carotid arteries. Skull: Normal. Negative for fracture or focal lesion. Sinuses/Orbits: No acute finding. Other: None. IMPRESSION: No acute intracranial abnormality. Electronically Signed   By: Ronney Asters M.D.   On: 11/04/2022 22:08   DG Chest 2 View  Result Date: 11/04/2022 CLINICAL DATA:  chest pains EXAM: CHEST - 2 VIEW COMPARISON:  Chest x-ray 11/02/2022, chest x-ray 10/18/2022, chest x-ray 05/14/2022 FINDINGS: The heart and mediastinal contours are unchanged. Aortic calcification. Question developing airspace/interstitial opacity overlying the left upper lung zone. No pulmonary edema. No pleural effusion. No pneumothorax. No acute osseous abnormality. IMPRESSION: 1. Question developing airspace/interstitial opacity overlying the left upper lung zone. 2.  Aortic Atherosclerosis (ICD10-I70.0). Electronically Signed   By: Iven Finn M.D.   On: 11/04/2022 21:32    Pending Labs Unresulted Labs (From admission, onward)     Start     Ordered   11/05/22  0843  Hemoglobin A1c  (Glycemic Control (SSI)  Q 4 Hours / Glycemic Control (SSI)  AC +/- HS)  Once,   R       Comments: To assess prior glycemic control    11/05/22 0844            Vitals/Pain Today's Vitals   11/05/22 1830 11/05/22 1900 11/05/22 1933 11/05/22 1934  BP: (!) 158/78     Pulse: 63     Resp: 15     Temp:      TempSrc:      SpO2: 100%  100%   Weight:      Height:      PainSc:  0-No pain  0-No pain    Isolation Precautions No active isolations  Medications Medications  rosuvastatin (CRESTOR) tablet 20 mg (20 mg Oral Given 11/05/22 1131)  pantoprazole (PROTONIX) EC tablet 40 mg (40 mg Oral Given 11/05/22 1130)  insulin aspart (novoLOG) injection 0-15 Units ( Subcutaneous Not Given 11/05/22 1612)  insulin aspart (novoLOG) injection 0-5 Units (has no administration in time range)  heparin injection 5,000 Units (5,000 Units Subcutaneous Given 11/05/22 1429)  acetaminophen (TYLENOL) tablet 650 mg (has no administration in time range)    Or  acetaminophen (TYLENOL) suppository 650 mg (has no administration in time range)  oxyCODONE (Oxy IR/ROXICODONE) immediate release tablet 5 mg (has no administration in time range)  traZODone (DESYREL) tablet 25 mg (has no administration in time range)  docusate sodium (COLACE) capsule 100 mg (100 mg Oral Patient Refused/Not Given 11/05/22 0950)  polyethylene glycol (MIRALAX / GLYCOLAX) packet 17 g (has no administration in time range)  ondansetron (ZOFRAN) tablet 4 mg (has no administration in time range)    Or  ondansetron (ZOFRAN) injection 4 mg (has no administration in time range)  labetalol (NORMODYNE) injection 10 mg (10 mg Intravenous Given 11/05/22 0948)  amLODipine (NORVASC) tablet 10 mg (10 mg Oral Given 11/05/22 1259)  methocarbamol (ROBAXIN) tablet 750 mg (750 mg Oral Given 11/05/22 1652)  acetaminophen (TYLENOL) tablet 1,000 mg (1,000 mg Oral Given 11/05/22 0432)  nitroGLYCERIN (NITROSTAT) SL tablet 0.4 mg (0.4 mg  Sublingual Given 11/05/22 0524)  aspirin chewable tablet 324 mg (324 mg Oral Given 11/05/22 0432)  labetalol (NORMODYNE) injection 20 mg (20 mg Intravenous Given 11/05/22 0553)  cloNIDine (CATAPRES) tablet 0.1 mg (0.1 mg  Oral Given 11/05/22 1131)    Mobility walks

## 2022-11-06 ENCOUNTER — Inpatient Hospital Stay (HOSPITAL_COMMUNITY): Payer: Medicare PPO

## 2022-11-06 DIAGNOSIS — I5033 Acute on chronic diastolic (congestive) heart failure: Secondary | ICD-10-CM

## 2022-11-06 DIAGNOSIS — R079 Chest pain, unspecified: Secondary | ICD-10-CM | POA: Diagnosis not present

## 2022-11-06 DIAGNOSIS — I10 Essential (primary) hypertension: Secondary | ICD-10-CM | POA: Diagnosis not present

## 2022-11-06 DIAGNOSIS — D649 Anemia, unspecified: Secondary | ICD-10-CM | POA: Diagnosis not present

## 2022-11-06 DIAGNOSIS — N179 Acute kidney failure, unspecified: Secondary | ICD-10-CM

## 2022-11-06 DIAGNOSIS — R0789 Other chest pain: Secondary | ICD-10-CM | POA: Diagnosis not present

## 2022-11-06 DIAGNOSIS — I160 Hypertensive urgency: Secondary | ICD-10-CM

## 2022-11-06 LAB — HEMOGLOBIN A1C
Hgb A1c MFr Bld: 6.3 % — ABNORMAL HIGH (ref 4.8–5.6)
Mean Plasma Glucose: 134 mg/dL

## 2022-11-06 LAB — FERRITIN: Ferritin: 21 ng/mL (ref 11–307)

## 2022-11-06 LAB — GLUCOSE, CAPILLARY
Glucose-Capillary: 125 mg/dL — ABNORMAL HIGH (ref 70–99)
Glucose-Capillary: 99 mg/dL (ref 70–99)
Glucose-Capillary: 169 mg/dL — ABNORMAL HIGH (ref 70–99)
Glucose-Capillary: 178 mg/dL — ABNORMAL HIGH (ref 70–99)
Glucose-Capillary: 160 mg/dL — ABNORMAL HIGH (ref 70–99)

## 2022-11-06 LAB — BASIC METABOLIC PANEL
Anion gap: 10 (ref 5–15)
BUN: 45 mg/dL — ABNORMAL HIGH (ref 8–23)
CO2: 26 mmol/L (ref 22–32)
Calcium: 8.7 mg/dL — ABNORMAL LOW (ref 8.9–10.3)
Chloride: 105 mmol/L (ref 98–111)
Creatinine, Ser: 4.8 mg/dL — ABNORMAL HIGH (ref 0.44–1.00)
GFR, Estimated: 9 mL/min — ABNORMAL LOW (ref >60)
Glucose, Bld: 119 mg/dL — ABNORMAL HIGH (ref 70–99)
Potassium: 4 mmol/L (ref 3.5–5.1)
Sodium: 141 mmol/L (ref 135–145)

## 2022-11-06 LAB — ECHOCARDIOGRAM COMPLETE
Area-P 1/2: 2.84 cm2
Calc EF: 48.6 %
Height: 66 in
P 1/2 time: 395 msec
S' Lateral: 3.9 cm
Single Plane A2C EF: 49.4 %
Single Plane A4C EF: 47.8 %
Weight: 2479.73 oz

## 2022-11-06 LAB — IRON AND TIBC
Iron: 33 ug/dL (ref 28–170)
Saturation Ratios: 11 % (ref 10.4–31.8)
TIBC: 300 ug/dL (ref 250–450)
UIBC: 267 ug/dL

## 2022-11-06 LAB — CBC
HCT: 29.4 % — ABNORMAL LOW (ref 36.0–46.0)
Hemoglobin: 9.2 g/dL — ABNORMAL LOW (ref 12.0–15.0)
MCH: 27.9 pg (ref 26.0–34.0)
MCHC: 31.3 g/dL (ref 30.0–36.0)
MCV: 89.1 fL (ref 80.0–100.0)
Platelets: 222 10*3/uL (ref 150–400)
RBC: 3.3 MIL/uL — ABNORMAL LOW (ref 3.87–5.11)
RDW: 13.7 % (ref 11.5–15.5)
WBC: 4.3 10*3/uL (ref 4.0–10.5)
nRBC: 0 % (ref 0.0–0.2)

## 2022-11-06 MED ORDER — ALUM & MAG HYDROXIDE-SIMETH 200-200-20 MG/5ML PO SUSP
30.0000 mL | Freq: Four times a day (QID) | ORAL | Status: DC | PRN
  Administered 2022-11-07: 30 mL via ORAL
  Filled 2022-11-06 (×2): qty 30

## 2022-11-06 MED ORDER — CARVEDILOL 6.25 MG PO TABS
6.2500 mg | ORAL_TABLET | Freq: Two times a day (BID) | ORAL | Status: DC
  Administered 2022-11-06 – 2022-11-10 (×9): 6.25 mg via ORAL
  Filled 2022-11-06 (×9): qty 1

## 2022-11-06 NOTE — Progress Notes (Addendum)
Rounding Note    Patient Name: Teresa Poole Date of Encounter: 11/06/2022  [facility] Cardiologist: Larae Grooms, MD   Subjective   Patient reports that she is feeling well this AM. No chest pain. Headache and neck pain have resolved. No shortness of breath.   Inpatient Medications    Scheduled Meds:  amLODipine  10 mg Oral Daily   docusate sodium  100 mg Oral BID   heparin  5,000 Units Subcutaneous Q8H   insulin aspart  0-15 Units Subcutaneous TID WC   insulin aspart  0-5 Units Subcutaneous QHS   pantoprazole  40 mg Oral Daily   rosuvastatin  20 mg Oral Daily   Continuous Infusions:  PRN Meds: acetaminophen **OR** acetaminophen, labetalol, methocarbamol, ondansetron **OR** ondansetron (ZOFRAN) IV, oxyCODONE, polyethylene glycol, traZODone   Vital Signs    Vitals:   11/05/22 2021 11/06/22 0036 11/06/22 0551 11/06/22 0713  BP: (!) 153/69 (!) 121/57 (!) 132/55 (!) 147/58  Pulse: 70 67 69 63  Resp: 17 16 16 18   Temp: 98.4 F (36.9 C) 98.1 F (36.7 C) 98.2 F (36.8 C) 98.3 F (36.8 C)  TempSrc: Oral Oral Oral Oral  SpO2: 100% 100% 97% 100%  Weight:   70.3 kg   Height:   5\' 6"  (1.676 m)     Intake/Output Summary (Last 24 hours) at 11/06/2022 0834 Last data filed at 11/06/2022 0700 Gross per 24 hour  Intake 208 ml  Output --  Net 208 ml      11/06/2022    5:51 AM 11/04/2022    8:48 PM 10/10/2022    3:49 PM  Last 3 Weights  Weight (lbs) 154 lb 15.7 oz 165 lb 165 lb 5.5 oz  Weight (kg) 70.3 kg 74.844 kg 75 kg      Telemetry    NSR, HR in the 60s-70s - Personally Reviewed  ECG    No new tracings since 3/13 - Personally Reviewed  Physical Exam   GEN: No acute distress. Sitting upright in the bed   Neck: No JVD. Bruit over left carotid artery  Cardiac: RRR, grade 2/6 systolic murmur at RUSB. Radial pulses 2+ bilaterally  Respiratory: Clear to auscultation bilaterally. Normal WOB on room air  GI: Soft, nontender, non-distended   MS: No edema in BLE; No deformity. Neuro:  Nonfocal  Psych: Normal affect   Labs    High Sensitivity Troponin:   Recent Labs  Lab 11/02/22 1700 11/02/22 2026 11/04/22 2129 11/05/22 0429 11/05/22 2246  TROPONINIHS 68* 60* 49* 54* 39*     Chemistry Recent Labs  Lab 11/02/22 1700 11/04/22 2129 11/05/22 1100  NA 143 139  --   K 4.4 4.0  --   CL 107 104  --   CO2 21* 24  --   GLUCOSE 109* 113*  --   BUN 39* 38*  --   CREATININE 4.61* 4.61* 4.71*  CALCIUM 8.9 8.8*  --   MG 1.9  --   --   PROT 6.4*  --   --   ALBUMIN 3.2*  --   --   AST 19  --   --   ALT 11  --   --   ALKPHOS 51  --   --   BILITOT 0.3  --   --   GFRNONAA 10* 10* 9*  ANIONGAP 15 11  --     Lipids No results for input(s): "CHOL", "TRIG", "HDL", "LABVLDL", "LDLCALC", "CHOLHDL" in the last 168 hours.  Hematology Recent Labs  Lab 11/04/22 2129 11/05/22 1100 11/06/22 0744  WBC 5.5 4.7 4.3  RBC 3.26* 3.06* 3.30*  HGB 9.1* 8.6* 9.2*  HCT 29.1* 27.3* 29.4*  MCV 89.3 89.2 89.1  MCH 27.9 28.1 27.9  MCHC 31.3 31.5 31.3  RDW 13.7 13.9 13.7  PLT 228 202 222   Thyroid No results for input(s): "TSH", "FREET4" in the last 168 hours.  BNPNo results for input(s): "BNP", "PROBNP" in the last 168 hours.  DDimer No results for input(s): "DDIMER" in the last 168 hours.   Radiology    VAS US CAROTID  Result Date: 11/05/2022 Carotid Arterial Duplex Study Patient Name:  JIMMI MUHLBAUER  Date of Exam:   11/05/2022 Medical Rec #: [id number]           Accession #:    [id number] Date of Birth: 01/15/1951           Patient Gender: F Patient Age:   72 years Exam Location:  [facility] Procedure:      VAS US CAROTID Referring Phys: M[facility] --------------------------------------------------------------------------------  Indications:       History of left ICA stenosis, left neck and ear pain. Risk Factors:      Hypertension, hyperlipidemia, Diabetes, no history of                    smoking, mild coronary  artery disease. Other Factors:     Renal artery stenosis. Comparison Study:  07-01-2022 MRA Neck indicating approximately 50% left ICA                    stenosis and severe left vertebral artery stenosis. Performing Technologist: Darlin Coco RDMS, RVT  Examination Guidelines: A complete evaluation includes B-mode imaging, spectral Doppler, color Doppler, and power Doppler as needed of all accessible portions of each vessel. Bilateral testing is considered an integral part of a complete examination. Limited examinations for reoccurring indications may be performed as noted.  Right Carotid Findings: +----------+--------+--------+--------+-------------------+--------------------+           PSV cm/sEDV cm/sStenosisPlaque Description Comments             +----------+--------+--------+--------+-------------------+--------------------+ CCA Prox  76      12                                                      +----------+--------+--------+--------+-------------------+--------------------+ CCA Distal81      11                                 intimal thickening   +----------+--------+--------+--------+-------------------+--------------------+ ICA Prox  166     37      1-39%   calcific and       Upper range-                                           heterogenous       borderline 40-59%    +----------+--------+--------+--------+-------------------+--------------------+ ICA Mid   104     26                                                      +----------+--------+--------+--------+-------------------+--------------------+  ICA Distal103     29                                                      +----------+--------+--------+--------+-------------------+--------------------+ +----------+--------+-------+---------+-------------------+           PSV cm/sEDV cmsDescribe Arm Pressure (mmHG) +----------+--------+-------+---------+-------------------+ Subclavian70              Turbulent                    +----------+--------+-------+---------+-------------------+ +---------+--------+---+--------+--+---------+ VertebralPSV cm/s117EDV cm/s23Antegrade +---------+--------+---+--------+--+---------+  Left Carotid Findings: +----------+--------+--------+--------+----------------------------+-----------+           PSV cm/sEDV cm/sStenosisPlaque Description          Comments    +----------+--------+--------+--------+----------------------------+-----------+ CCA Prox  92      14                                                      +----------+--------+--------+--------+----------------------------+-----------+ CCA Distal103     20      <50%    heterogenous                            +----------+--------+--------+--------+----------------------------+-----------+ ICA Prox  213     51      40-59%  calcific, heterogenous and  Upper range                                   irregular                               +----------+--------+--------+--------+----------------------------+-----------+ ICA Mid   145     29                                                      +----------+--------+--------+--------+----------------------------+-----------+ ICA Distal150     32                                                      +----------+--------+--------+--------+----------------------------+-----------+ ECA       64                                                              +----------+--------+--------+--------+----------------------------+-----------+ +----------+--------+--------+---------+-------------------+           PSV cm/sEDV cm/sDescribe Arm Pressure (mmHG) +----------+--------+--------+---------+-------------------+ [id number]             Turbulent                    +----------+--------+--------+---------+-------------------+ +---------+--------+--+--------+--+---------+ VertebralPSV cm/s47EDV cm/s11Antegrade  +---------+--------+--+--------+--+---------+  Summary: Right Carotid: Velocities in the right ICA are consistent with a 1-39% stenosis. Left Carotid: Velocities in the left ICA are consistent with a 40-59% stenosis.               Non-hemodynamically significant plaque <50% noted in the CCA. Vertebrals:  Bilateral vertebral arteries demonstrate antegrade flow. Subclavians: Bilateral subclavian artery flow was disturbed. *See table(s) above for measurements and observations.  Electronically signed by Antony Contras MD on 11/05/2022 at 12:13:57 PM.    Final    CT Head Wo Contrast  Result Date: 11/04/2022 CLINICAL DATA:  Headache EXAM: CT HEAD WITHOUT CONTRAST TECHNIQUE: Contiguous axial images were obtained from the base of the skull through the vertex without intravenous contrast. RADIATION DOSE REDUCTION: This exam was performed according to the departmental dose-optimization program which includes automated exposure control, adjustment of the mA and/or kV according to patient size and/or use of iterative reconstruction technique. COMPARISON:  CT head 10/18/2022.  MRI head 04/03/2022. FINDINGS: Brain: No evidence of acute infarction, hemorrhage, hydrocephalus, extra-axial collection or mass lesion/mass effect. Vascular: Atherosclerotic calcifications are present within the cavernous internal carotid arteries. Skull: Normal. Negative for fracture or focal lesion. Sinuses/Orbits: No acute finding. Other: None. IMPRESSION: No acute intracranial abnormality. Electronically Signed   By: Ronney Asters M.D.   On: 11/04/2022 22:08   DG Chest 2 View  Result Date: 11/04/2022 CLINICAL DATA:  chest pains EXAM: CHEST - 2 VIEW COMPARISON:  Chest x-ray 11/02/2022, chest x-ray 10/18/2022, chest x-ray 05/14/2022 FINDINGS: The heart and mediastinal contours are unchanged. Aortic calcification. Question developing airspace/interstitial opacity overlying the left upper lung zone. No pulmonary edema. No pleural effusion. No  pneumothorax. No acute osseous abnormality. IMPRESSION: 1. Question developing airspace/interstitial opacity overlying the left upper lung zone. 2.  Aortic Atherosclerosis (ICD10-I70.0). Electronically Signed   By: Iven Finn M.D.   On: 11/04/2022 21:32    Cardiac Studies   Echocardiogram Pending   Carotid Duplex 11/05/22 Summary:  Right Carotid: Velocities in the right ICA are consistent with a 1-39%  stenosis.   Left Carotid: Velocities in the left ICA are consistent with a 40-59%  stenosis.               Non-hemodynamically significant plaque <50% noted in the  CCA.   Vertebrals: Bilateral vertebral arteries demonstrate antegrade flow.  Subclavians: Bilateral subclavian artery flow was disturbed.   Patient Profile     72 y.o. female with a hx of NICM, chronic combined CHF, mild nonobstructive CAD (cath 2015), anemia, CKD stage IV, HTN, HLD, GERD, carotid stenosis, L renal artery stenosis s/p stenting 2020, thyroid disease who is being seen for the evaluation of elevated troponin   Assessment & Plan    Elevated troponins Chest Pain  - High-sensitivity troponin 49>54>39.  Of note, on 3/11, hsTn was 68>60 - BP was elevated to 214/84 on arrival to the ED. Creatinine 4.61 - Patient did complain of chest pain in the ED, but it was very atypical.  Initially described as a stabbing/pounding sensation. Chest pain has since resolved and patient is currently chest pain free  - Suspect that chest pain is secondary to severely elevated blood pressure.  Trop elevation is likely type II MI in the setting of elevated BP, worsening kidney function - No need for IV heparin.  Echocardiogram ordered to assess EF and wall motion- scheduled for today    Hypertensive emergency - BP initially elevated to 214/84 in the ED.  She was given a one  time dose of clonidine yesterday, and her home medications were resumed. Also started amlodipine 10 mg daily yesterday  - Continue amlodipine 10 mg daily.  Holding lisinopril given poor kidney function. Resume home carvedilol at 6.25 mg BID (was taking 25 mg BID at home, dose reduced for low HR)  - BP improving, was 147/58 this AM  - Note, patient does have memory issues that have been worsening over the past year. Sister at bedside did tell Dr. Harriet Masson yesterday that the patient may not be taking her medications as prescribed. Sister willing to help make sure patient is taking her medications after she is discharged  - Workup per primary    History of nonischemic cardiomyopathy - Echocardiogram in 2015 showed EF 30-35%.  Patient underwent heart catheterization in 2015 that showed nonobstructive CAD with serial 20% proximal and mid LAD stenosis, 30% proximal left circumflex stenosis, 30% mid AV groove circumflex stenosis. - Most recent echocardiogram from 03/2022 showed EF 55-60%, no regional wall motion abnormalities - Patient euvolemic on exam. CXR without pulmonary edema or pleural effusions  - Continue carvedilol. Lisinopril held for poor renal function  - Repeat echocardiogram pending   CKD stage IV - Creatinine elevated to 4.61 on admission and is up to 4.8 today. Appears that baseline is around 3.3-3.7  - Reports seeing Dr. Joelyn Oms with nephrology, may benefit from nephrology consult if creatinine continues to worsen    History of nonobstructive CAD - Noted on heart catheterization in 2015 -Continue carvedilol, aspirin, Crestor  For questions or updates, please contact [facility] Please consult [email or web address] for contact info under        Signed, Margie Billet, PA-C  11/06/2022, 8:34 AM    Personally seen and examined. Agree with above.  72 year old with known nonischemic cardiomyopathy from cardiac catheterization 2015 with elevated troponin in the setting of hypertensive urgency.  Agree that this is likely a type II myocardial infarction, not acute coronary syndrome.  No IV heparin.  Preliminarily echocardiogram  images show ejection fraction in the 45% range.  In the past, 2015, EF was 30 to 35%.  Most recently in August 2023 it was reported 55 to 60%.  Treatment of hypertension will be essential for underlying nonischemic cardiomyopathy.  Unable to use traditional ARNI/Entresto given creatinine.  Lisinopril previously taken had been held for poor renal function.  Unable to utilize spironolactone as well.  Could consider SGLT2 inhibitor Jardiance 10 mg once a day.  Currently taking amlodipine 10 mg as well.  Candee Furbish, MD

## 2022-11-06 NOTE — Progress Notes (Signed)
Pt told Dr. Royce Macadamia "I will commit suicide." RN walk in assessed the pt. RN ask pt twice "do you want to hurt your self?" Per pt stated "if she want to kill herself, she won't tell anybody." RN continue asked the pt "do you have any plan to do this?" Pt stated "she will plan everything and won't tell anyone." On-call and charge nurse notified.

## 2022-11-06 NOTE — Progress Notes (Signed)
[facility] PROGRESS NOTE   KAYELEE ABID [id number] DOB: 05/16/1951 DOA: 11/04/2022  PCP: [name], MD  Brief History/Interval Summary: 72 y.o. female with medical history significant for obesity, combined chronic systolic and diastolic congestive heart failure, CKD stage IV baseline creatinine about 3, history of renal artery stenosis status post stent about 5 years ago, admitted to the hospital with uncontrolled hypertension and atypical chest pain.  Noted to have systolic blood pressures greater than 200.  There is some concern about noncompliance although patient is certain she takes all of her medications.  Apparently there have been some memory issues recently.  Patient was hospitalized for further management.    Consultants: Cardiology.  Nephrology  Procedures: Echocardiogram is pending    Subjective/Interval History: Patient denies any chest pain shortness of breath nausea or vomiting this morning.  No leg swelling.  She is adamant that she has been compliant with her medications at home.    Assessment/Plan:  Hypertensive emergency Likely reason for her chest discomfort.  Systolic blood pressure was greater than 200.  Patient was given her antihypertensives with improvement in blood pressure.  Cardiology was consulted. She is currently on amlodipine, carvedilol.  ACE inhibitor is currently on hold due to worsening creatinine. She was given a dose of clonidine in the emergency department. Blood pressures have improved.  Continue to monitor. Patient does have a history of renal artery stenosis status post stent previously.  Will order renal vascular study. Carotid Dopplers did show moderate disease.  But she is asymptomatic.  Outpatient management.  Chest pain Atypical.  Likely secondary to elevated blood pressure.  Troponin trends have been flat. Echocardiogram is pending.  Acute on chronic kidney disease stage IV Baseline creatinine is around 3.7.   Presented with a higher than baseline creatinine.  Noted to be 4.8 this morning.  Followed by Dr. Joelyn Oms with [facility].  Nephrology will be consulted to assist with management. Avoid nephrotoxic agents.  Monitor urine output. Renal ultrasound will be ordered. Lisinopril and furosemide on hold currently.  History of cardiomyopathy Echocardiogram from August 2023 showed EF to be 55 to 60%.  Back in 2015 she had a EF of 30 to 35%. Seems to be fairly euvolemic currently.  Lisinopril on hold due to elevated creatinine. Echocardiogram is pending.  History of coronary artery disease This is nonobstructive based on cardiac catheterization in 2015.  Renal artery stenosis She was found to have left renal artery stenosis.  Underwent stent placement and February 2020.  Renal vascular study has been ordered considering her uncontrolled hypertension.  Normocytic anemia Likely anemia of chronic disease.  No evidence of overt bleeding.  Hemoglobin seems to be close to baseline.  Memory impairment This was reported by patient's sister.  CT head did not show any acute findings.  TSH was normal when checked in February.  Vitamin B12 level was 242 in August 2023.  This is borderline low.  Will recheck it tomorrow.  Not noted to be on any B12 supplementation.  Will also check folic acid level. May need further workup in the outpatient setting with referral to neurology.  DVT Prophylaxis: Subcutaneous heparin Code Status: Full code Family Communication: Discussed with patient Disposition Plan: Hopefully return home when improved  Status is: Inpatient Remains inpatient appropriate because: Hypertensive emergency, acute kidney injury      Medications: Scheduled:  amLODipine  10 mg Oral Daily   carvedilol  6.25 mg Oral BID WC   docusate sodium  100 mg Oral BID   heparin  5,000 Units Subcutaneous Q8H   insulin aspart  0-15 Units Subcutaneous TID WC   insulin aspart  0-5 Units  Subcutaneous QHS   pantoprazole  40 mg Oral Daily   rosuvastatin  20 mg Oral Daily   Continuous: [id number] **OR** acetaminophen, labetalol, methocarbamol, ondansetron **OR** ondansetron (ZOFRAN) IV, oxyCODONE, polyethylene glycol, traZODone  Antibiotics: Anti-infectives (From admission, onward)    None       Objective:  Vital Signs  Vitals:   11/06/22 0036 11/06/22 0551 11/06/22 0713 11/06/22 0902  BP: (!) 121/57 (!) 132/55 (!) 147/58 (!) 147/63  Pulse: 67 69 63 71  Resp: 16 16 18    Temp: 98.1 F (36.7 C) 98.2 F (36.8 C) 98.3 F (36.8 C)   TempSrc: Oral Oral Oral   SpO2: 100% 97% 100%   Weight:  70.3 kg    Height:  5\' 6"  (1.676 m)      Intake/Output Summary (Last 24 hours) at 11/06/2022 1047 Last data filed at 11/06/2022 0700 Gross per 24 hour  Intake 208 ml  Output --  Net 208 ml   Filed Weights   11/04/22 2048 11/06/22 0551  Weight: 74.8 kg 70.3 kg    General appearance: Awake alert.  In no distress Resp: Clear to auscultation bilaterally.  Normal effort Cardio: S1-S2 is normal regular.  No S3-S4.  No rubs murmurs or bruit GI: Abdomen is soft.  Nontender nondistended.  Bowel sounds are present normal.  No masses organomegaly Extremities: No edema.  Full range of motion of lower extremities. Neurologic:  No focal neurological deficits.    Lab Results:  Data Reviewed: I have personally reviewed following labs and reports of the imaging studies  CBC: Recent Labs  Lab 11/02/22 1700 11/04/22 2129 11/05/22 1100 11/06/22 0744  WBC 5.6 5.5 4.7 4.3  NEUTROABS 3.5  --   --   --   HGB 9.6* 9.1* 8.6* 9.2*  HCT 30.1* 29.1* 27.3* 29.4*  MCV 88.5 89.3 89.2 89.1  PLT 238 228 202 [id number]    Basic Metabolic Panel: Recent Labs  Lab 11/02/22 1700 11/04/22 2129 11/05/22 1100 11/06/22 0744  NA 143 139  --  141  K 4.4 4.0  --  4.0  CL 107 104  --  105  CO2 21* 24  --  26  GLUCOSE 109* 113*  --  119*  BUN 39* 38*  --  45*  CREATININE 4.61* 4.61*  4.71* 4.80*  CALCIUM 8.9 8.8*  --  8.7*  MG 1.9  --   --   --     GFR: Estimated Creatinine Clearance: 10.2 mL/min (A) (by C-G formula based on SCr of 4.8 mg/dL (H)).  Liver Function Tests: Recent Labs  Lab 11/02/22 1700  AST 19  ALT 11  ALKPHOS 51  BILITOT 0.3  PROT 6.4*  ALBUMIN 3.2*     HbA1C: Recent Labs    11/05/22 1100  HGBA1C 6.3*    CBG: Recent Labs  Lab 11/05/22 1215 11/05/22 1611 11/05/22 2147 11/06/22 0741  GLUCAP 119* 119* 169* 125*      Radiology Studies: VAS US CAROTID  Result Date: 11/05/2022 Carotid Arterial Duplex Study Patient Name:  DSHANTI KARREN  Date of Exam:   11/05/2022 Medical Rec #: [id number]           Accession #:    [id number] Date of Birth: 17-Jun-1951           Patient Gender: F Patient  Age:   65 years Exam Location:  [facility] Procedure:      VAS US CAROTID Referring Phys: [facility] --------------------------------------------------------------------------------  Indications:       History of left ICA stenosis, left neck and ear pain. Risk Factors:      Hypertension, hyperlipidemia, Diabetes, no history of                    smoking, mild coronary artery disease. Other Factors:     Renal artery stenosis. Comparison Study:  07-01-2022 MRA Neck indicating approximately 50% left ICA                    stenosis and severe left vertebral artery stenosis. Performing Technologist: Darlin Coco RDMS, RVT  Examination Guidelines: A complete evaluation includes B-mode imaging, spectral Doppler, color Doppler, and power Doppler as needed of all accessible portions of each vessel. Bilateral testing is considered an integral part of a complete examination. Limited examinations for reoccurring indications may be performed as noted.  Right Carotid Findings: +----------+--------+--------+--------+-------------------+--------------------+           PSV cm/sEDV cm/sStenosisPlaque Description Comments              +----------+--------+--------+--------+-------------------+--------------------+ CCA Prox  76      12                                                      +----------+--------+--------+--------+-------------------+--------------------+ CCA Distal81      11                                 intimal thickening   +----------+--------+--------+--------+-------------------+--------------------+ ICA Prox  166     37      1-39%   calcific and       Upper range-                                           heterogenous       borderline 40-59%    +----------+--------+--------+--------+-------------------+--------------------+ ICA Mid   104     26                                                      +----------+--------+--------+--------+-------------------+--------------------+ ICA Distal103     29                                                      +----------+--------+--------+--------+-------------------+--------------------+ +----------+--------+-------+---------+-------------------+           PSV cm/sEDV cmsDescribe Arm Pressure (mmHG) +----------+--------+-------+---------+-------------------+ Subclavian70             Turbulent                    +----------+--------+-------+---------+-------------------+ +---------+--------+---+--------+--+---------+ VertebralPSV cm/s117EDV cm/s23Antegrade +---------+--------+---+--------+--+---------+  Left Carotid Findings: +----------+--------+--------+--------+----------------------------+-----------+  PSV cm/sEDV cm/sStenosisPlaque Description          Comments    +----------+--------+--------+--------+----------------------------+-----------+ CCA Prox  92      14                                                      +----------+--------+--------+--------+----------------------------+-----------+ CCA Distal103     20      <50%    heterogenous                             +----------+--------+--------+--------+----------------------------+-----------+ ICA Prox  213     51      40-59%  calcific, heterogenous and  Upper range                                   irregular                               +----------+--------+--------+--------+----------------------------+-----------+ ICA Mid   145     29                                                      +----------+--------+--------+--------+----------------------------+-----------+ ICA Distal150     32                                                      +----------+--------+--------+--------+----------------------------+-----------+ ECA       64                                                              +----------+--------+--------+--------+----------------------------+-----------+ +----------+--------+--------+---------+-------------------+           PSV cm/sEDV cm/sDescribe Arm Pressure (mmHG) +----------+--------+--------+---------+-------------------+ [id number]             Turbulent                    +----------+--------+--------+---------+-------------------+ +---------+--------+--+--------+--+---------+ VertebralPSV cm/s47EDV cm/s11Antegrade +---------+--------+--+--------+--+---------+   Summary: Right Carotid: Velocities in the right ICA are consistent with a 1-39% stenosis. Left Carotid: Velocities in the left ICA are consistent with a 40-59% stenosis.               Non-hemodynamically significant plaque <50% noted in the CCA. Vertebrals:  Bilateral vertebral arteries demonstrate antegrade flow. Subclavians: Bilateral subclavian artery flow was disturbed. *See table(s) above for measurements and observations.  Electronically signed by Antony Contras MD on 11/05/2022 at 12:13:57 PM.    Final    CT Head Wo Contrast  Result Date: 11/04/2022 CLINICAL DATA:  Headache EXAM: CT HEAD WITHOUT CONTRAST TECHNIQUE: Contiguous axial images were obtained from the base of the  skull through the vertex without intravenous contrast. RADIATION DOSE REDUCTION: This  exam was performed according to the departmental dose-optimization program which includes automated exposure control, adjustment of the mA and/or kV according to patient size and/or use of iterative reconstruction technique. COMPARISON:  CT head 10/18/2022.  MRI head 04/03/2022. FINDINGS: Brain: No evidence of acute infarction, hemorrhage, hydrocephalus, extra-axial collection or mass lesion/mass effect. Vascular: Atherosclerotic calcifications are present within the cavernous internal carotid arteries. Skull: Normal. Negative for fracture or focal lesion. Sinuses/Orbits: No acute finding. Other: None. IMPRESSION: No acute intracranial abnormality. Electronically Signed   By: Ronney Asters M.D.   On: 11/04/2022 22:08   DG Chest 2 View  Result Date: 11/04/2022 CLINICAL DATA:  chest pains EXAM: CHEST - 2 VIEW COMPARISON:  Chest x-ray 11/02/2022, chest x-ray 10/18/2022, chest x-ray 05/14/2022 FINDINGS: The heart and mediastinal contours are unchanged. Aortic calcification. Question developing airspace/interstitial opacity overlying the left upper lung zone. No pulmonary edema. No pleural effusion. No pneumothorax. No acute osseous abnormality. IMPRESSION: 1. Question developing airspace/interstitial opacity overlying the left upper lung zone. 2.  Aortic Atherosclerosis (ICD10-I70.0). Electronically Signed   By: Iven Finn M.D.   On: 11/04/2022 21:32       LOS: 1 day   [place] [facility] Pager on [email or web address]  11/06/2022, 10:47 AM

## 2022-11-06 NOTE — Progress Notes (Cosign Needed)
  Transition of Care [facility]) Screening Note   Patient Details  Name: Teresa Poole Date of Birth: 06-29-1951   [facility]) CM/SW Contact:    Henrietta Dine, RN Phone Number: 11/06/2022, 9:11 AM    Transition of Care Department [facility]) has reviewed patient and no TOC needs have been identified at this time. We will continue to monitor patient advancement through interdisciplinary progression rounds. If new patient transition needs arise, please place a TOC consult.

## 2022-11-06 NOTE — Progress Notes (Signed)
Renal artery duplex has been completed. Preliminary results can be found in CV Proc through chart review.   11/06/22 3:51 PM Teresa Poole RVT

## 2022-11-06 NOTE — Progress Notes (Addendum)
   CROSS COVER NOTE    Patient Name: Teresa Poole           DOB: October 11, 1950  MRN: [id number]      Admission Date: 11/04/2022  Attending Provider: Bonnielee Haff, MD  Primary Diagnosis: Atypical chest pain   Level of care: Progressive    Date of Service   11/06/2022   [name], 72 y.o. female, was admitted on 11/04/2022 for Atypical chest pain.    HPI/Events of Note   Patient has been placed on SI precaution after she told Dr. Royce Macadamia, "I will commit suicide." She has also said a similar statement to RN.   Interventions/ Plan   Sitter Suicide Precautions         Raenette Rover, [place], [place]

## 2022-11-06 NOTE — Progress Notes (Signed)
Echocardiogram 2D Echocardiogram has been performed.  Frances Furbish 11/06/2022, 10:34 AM

## 2022-11-06 NOTE — Consult Note (Signed)
[facility] Renal Consultation Note  Requesting MD: Bonnielee Haff, MD Indication for Consultation:  AKI on CKD   Chief complaint: chest pain   HPI:  Teresa Poole is a 72 y.o. female with a history of CKD stage V, hypertension, type 2 diabetes mellitus, chronic combined systolic and diastolic CHF, coronary artery disease, obesity, and renal artery stenosis who presented to the hospital with chest pain with left neck pain.  She was found to have uncontrolled HTN and has been treated as hypertensive urgency/emergency.  Cardiology was consulted and she was admitted to medicine.  They felt not consistent with ACS.  She was found to have acute kidney injury on top of her chronic kidney disease.  Strict ins/outs are not available.  She had two unmeasured urine voids over 3/14.  Nephrology was consulted for assistance with management.  Her creatinine trends are included below.  She has had a couple of AKI events over the past year with peak creatinine in the low to mid 4's during those times.  Home meds include lasix and lisinopril per charting here.  I do not see lisinopril or lasix on current outpatient med list through our office.  She last got lisinopril here on 3/14.  Reported compliance with home meds.  Per charting she lives alone and does have some trouble with her memory.  She had a renal artery duplex today with no evidence of right renal artery stenosis and 1-59% stenosis of the left renal artery.  Note she previously underwent left renal artery stent placement in 09/2018 with Dr. Carlis Abbott.   She follows with Dr. Joelyn Oms in our office and last saw him on 09/29/22.  She expressed during that visit that she would never want dialysis and has declined dialysis access; she cited watching multiple people in her life who have been on dialysis.  She was quoted as stating that she is "dying anyways, what's the point?".   Directly from the note from Dr. Joelyn Oms "She is clear and repeatedly tells  me that she would never receive dialysis, she would die first."  During our conversation today she states that she has never discussed this with Dr. Joelyn Oms.  Patient denies any NSAID use.   I called her sister, Teresa Poole, on the phone during our conversation as her memory impairment had been noted.  During our conversation, the patient stated "so I have no kidney function, I have no kidney function" and [name] and I both reiterated that that is not true.  She repeated that back to both of [place] and [place] encouraged her.  After she hung up the phone, she stated "I have no kidney function.  I know what I'll do - I'll commit suicide.  I'll commit suicide."  I notified her nurse who also came in and assessed her and I paged the on call coverage for the primary team.  They are arranging for a sitter.    Creatinine, Ser  Date/Time Value Ref Range Status  11/06/2022 07:44 AM 4.80 (H) 0.44 - 1.00 mg/dL Final  11/05/2022 11:00 AM 4.71 (H) 0.44 - 1.00 mg/dL Final  11/04/2022 09:29 PM 4.61 (H) 0.44 - 1.00 mg/dL Final  11/02/2022 05:00 PM 4.61 (H) 0.44 - 1.00 mg/dL Final  10/18/2022 09:50 PM 3.79 (H) 0.44 - 1.00 mg/dL Final  10/10/2022 03:55 PM 3.66 (H) 0.44 - 1.00 mg/dL Final  09/20/2022 09:51 PM 3.60 (H) 0.44 - 1.00 mg/dL Final  08/30/2022 12:08 PM 3.31 (H) 0.44 - 1.00 mg/dL Final  07/01/2022 04:52 PM 3.60 (H) 0.44 - 1.00 mg/dL Final  06/15/2022 07:14 PM 2.48 (H) 0.44 - 1.00 mg/dL Final  05/30/2022 10:04 PM 3.31 (H) 0.44 - 1.00 mg/dL Final  05/22/2022 04:14 PM 3.53 (H) 0.44 - 1.00 mg/dL Final  04/23/2022 12:23 AM 3.56 (H) 0.44 - 1.00 mg/dL Final  04/18/2022 01:54 AM 4.37 (H) 0.44 - 1.00 mg/dL Final  04/17/2022 07:49 PM 4.50 (H) 0.44 - 1.00 mg/dL Final  04/15/2022 05:53 PM 3.39 (H) 0.44 - 1.00 mg/dL Final  03/07/2022 12:15 PM 2.87 (H) 0.44 - 1.00 mg/dL Final  03/01/2022 01:17 AM 2.96 (H) 0.44 - 1.00 mg/dL Final  02/28/2022 08:32 AM 3.49 (H) 0.44 - 1.00 mg/dL Final  02/25/2022 03:25 PM 3.00 (H) 0.44 - 1.00  mg/dL Final  02/20/2022 05:29 PM 2.75 (H) 0.44 - 1.00 mg/dL Final  12/28/2021 08:13 PM 4.19 (H) 0.44 - 1.00 mg/dL Final  12/08/2021 10:01 AM 3.89 (H) 0.44 - 1.00 mg/dL Final    Comment:    REPEATED TO VERIFY CRITICAL RESULT CALLED TO, READ BACK BY AND VERIFIED WITH: SHEA CROFT MT @ [id number] BY J SCOTTON ON 12/08/21   11/29/2021 11:39 AM 3.32 (H) 0.44 - 1.00 mg/dL Final  10/28/2021 06:28 AM 2.21 (H) 0.44 - 1.00 mg/dL Final  10/27/2021 04:05 AM 3.09 (H) 0.44 - 1.00 mg/dL Final  10/26/2021 02:28 AM 3.21 (H) 0.44 - 1.00 mg/dL Final  10/25/2021 09:46 AM 2.78 (H) 0.44 - 1.00 mg/dL Final  10/17/2021 12:42 AM 2.80 (H) 0.44 - 1.00 mg/dL Final  10/15/2021 03:30 AM 3.16 (H) 0.44 - 1.00 mg/dL Final  10/14/2021 01:53 PM 3.71 (H) 0.44 - 1.00 mg/dL Final  09/26/2021 04:14 AM 2.18 (H) 0.44 - 1.00 mg/dL Final  09/25/2021 07:58 AM 1.86 (H) 0.44 - 1.00 mg/dL Final  09/24/2021 08:34 AM 2.27 (H) 0.44 - 1.00 mg/dL Final  09/23/2021 08:43 AM 2.84 (H) 0.44 - 1.00 mg/dL Final  09/22/2021 05:57 PM 2.70 (H) 0.44 - 1.00 mg/dL Final  09/15/2021 08:43 AM 2.28 (H) 0.44 - 1.00 mg/dL Final  09/09/2021 09:45 AM 2.96 (H) 0.44 - 1.00 mg/dL Final  08/28/2021 09:24 AM 2.06 (H) 0.44 - 1.00 mg/dL Final  08/25/2021 06:36 AM 2.74 (H) 0.44 - 1.00 mg/dL Final  08/23/2021 08:40 PM 2.07 (H) 0.44 - 1.00 mg/dL Final  08/21/2021 11:23 PM 2.29 (H) 0.44 - 1.00 mg/dL Final  06/10/2021 10:13 AM 2.48 (H) 0.44 - 1.00 mg/dL Final  06/09/2021 07:22 PM 2.63 (H) 0.44 - 1.00 mg/dL Final  06/02/2021 09:42 PM 2.63 (H) 0.44 - 1.00 mg/dL Final  03/08/2020 10:20 AM 2.31 (H) 0.40 - 1.20 mg/dL Final  09/20/2019 01:37 PM 2.24 (H) 0.40 - 1.20 mg/dL Final  04/12/2019 10:54 AM 2.51 (H) 0.40 - 1.20 mg/dL Final  12/06/2018 01:26 PM 2.08 (H) 0.40 - 1.20 mg/dL Final  11/27/2018 06:49 AM 2.21 (H) 0.44 - 1.00 mg/dL Final  10/07/2018 03:36 AM 2.33 (H) 0.44 - 1.00 mg/dL Final  10/06/2018 03:03 AM 2.14 (H) 0.44 - 1.00 mg/dL Final     PMHx:   Past Medical  History:  Diagnosis Date   Allergy    Anemia    Carotid stenosis    Chronic combined systolic and diastolic CHF (congestive heart failure) ([place])    CKD (chronic kidney disease), stage IV ([place]) 09/24/2013   Pt at [facility], Dr. Joelyn Oms   COVID-19 10/24/2021   Diabetes mellitus type 2 in nonobese ([place])    Edema 09/14/2013   GERD (gastroesophageal reflux disease)  History of ovarian cancer    Hyperlipidemia    Hypertension    Mild CAD    non-obstructive by LHC (09/25/2013): Proximal and mid LAD serial 20%, proximal circumflex 30%, mid AV groove circumflex 30%, mid RCA mild plaque.   NICM (nonischemic cardiomyopathy) ([facility])    Obesity (BMI 30-39.9)    RBBB    Renal artery stenosis ([place]) 10/11/2018   Thyroid disease    Seen by specialist    Past Surgical History:  Procedure Laterality Date   LEFT HEART CATHETERIZATION WITH CORONARY ANGIOGRAM N/A 09/25/2013   Procedure: LEFT HEART CATHETERIZATION WITH CORONARY ANGIOGRAM;  Surgeon: Burnell Blanks, MD;  Location[facility] CATH LAB;  Service: Cardiovascular;  Laterality: N/A;   RENAL ANGIOGRAPHY N/A 10/06/2018   Procedure: RENAL ANGIOGRAPHY;  Surgeon: Marty Heck, MD;  Location: [place] CV LAB;  Service: Cardiovascular;  Laterality: N/A;    Family Hx:  Family History  Problem Relation Age of Onset   Diabetes Father    Hypertension Father    Hypertension Maternal Grandfather     Social History:  reports that she has never smoked. She has never used smokeless tobacco. She reports that she does not drink alcohol and does not use drugs.  Allergies:  Allergies  Allergen Reactions   Hydralazine Hcl Itching    ENTIRE BODY = burning sensation, also in the eyes (they have become very sensitive to light)   Atorvastatin Other (See Comments)    Per MD - pt not sure of reaction     Medications: Prior to Admission medications   Medication Sig Start Date End Date Taking? Authorizing Provider  famotidine (PEPCID) 40 MG  tablet Take 40 mg by mouth daily. 09/04/21  Yes [provider]  furosemide (LASIX) 20 MG tablet Take 20 mg by mouth daily.   Yes [provider]  lisinopril (ZESTRIL) 40 MG tablet Take 40 mg by mouth daily. 06/17/22  Yes [provider]  omeprazole (PRILOSEC) 40 MG capsule Take 40 mg by mouth daily. 03/10/22  Yes [provider]  OZEMPIC, 0.25 OR 0.5 MG/DOSE, 2 MG/1.5ML SOPN Inject 0.25 mg into the skin once a week. Sundays 05/12/21  Yes [provider]  rosuvastatin (CRESTOR) 20 MG tablet Take 20 mg by mouth daily.   Yes [provider]  carvedilol (COREG) 25 MG tablet Take 25 mg by mouth 2 (two) times daily with a meal. Patient not taking: Reported on 11/05/2022    [provider]  furosemide (LASIX) 40 MG tablet Take 1 tablet (40 mg total) by mouth daily after breakfast. Patient not taking: Reported on [id number]   Delora Fuel, MD  glucose blood (ONE TOUCH ULTRA TEST) test strip Use to check blood sugar 3 times daily Dx code E11.65 Patient taking differently: 1 each by Other route See admin instructions. Use to check blood sugar 3 times daily Dx code E11.65 11/20/14   Elayne Snare, MD  guaiFENesin (ROBITUSSIN) 100 MG/5ML liquid Take 5-10 mLs (100-200 mg total) by mouth every 6 (six) hours as needed for cough or to loosen phlegm. Patient not taking: Reported on 08/30/2022 07/21/22   Domenic Moras, PA-C  polyethylene glycol (MIRALAX / GLYCOLAX) 17 g packet Take 17 g by mouth daily. Patient not taking: Reported on 11/05/2022 09/28/21   Eugenie Filler, MD  sucralfate (CARAFATE) 1 g tablet Take 1 tablet (1 g total) by mouth 4 (four) times daily -  with meals and at bedtime. Patient not taking: Reported on 10/19/2022  05/23/22   Montine Circle, PA-C   I have reviewed the patient's current and reported prior to admission medications.  Labs:     Latest Ref Rng & Units 11/06/2022    7:44 AM 11/05/2022   11:00 AM 11/04/2022    9:29 PM   BMP  Glucose 70 - 99 mg/dL 119   113   BUN 8 - 23 mg/dL 45   38   Creatinine 0.44 - 1.00 mg/dL 4.80  4.71  4.61   Sodium 135 - 145 mmol/L 141   139   Potassium 3.5 - 5.1 mmol/L 4.0   4.0   Chloride 98 - 111 mmol/L 105   104   CO2 22 - 32 mmol/L 26   24   Calcium 8.9 - 10.3 mg/dL 8.7   8.8     Urinalysis    Component Value Date/Time   COLORURINE COLORLESS (A) 11/04/2022 2129   APPEARANCEUR CLEAR 11/04/2022 2129   LABSPEC 1.004 (L) 11/04/2022 2129   PHURINE 6.0 11/04/2022 2129   GLUCOSEU NEGATIVE 11/04/2022 2129   HGBUR SMALL (A) 11/04/2022 2129   BILIRUBINUR NEGATIVE 11/04/2022 2129   [place] NEGATIVE 11/04/2022 2129   PROTEINUR 100 (A) 11/04/2022 2129   UROBILINOGEN 0.2 09/22/2013 1451   NITRITE NEGATIVE 11/04/2022 2129   LEUKOCYTESUR NEGATIVE 11/04/2022 2129     ROS:  Pertinent items noted in HPI and remainder of comprehensive ROS otherwise negative.  Unreliable given her memory impairment.  She does report chest tightness and neck pain improved.  She reports shortness of breath  Physical Exam: Vitals:   11/06/22 1328 11/06/22 1342  BP: (!) 157/64 138/72  Pulse: 69 70  Resp:  18  Temp:  98.2 F (36.8 C)  SpO2:  100%     General: elderly female in chair   HEENT: NCAT Eyes: EOMI sclera anicteric Neck: supple; trachea midline  Heart: S1S2 no rub Lungs: clear and unlabored; on room air  Abdomen: soft/nt/nd Extremities: no edema  Skin: no rash on extremities exposed Neuro: conversant but confused.  She states "so I have no kidney function" multiple different times and on multiple occasions her sister and I reinforced that this was not correct. She repeated this to me after the call with her sister and then repeated this to her nurse Psych: at times anxious and tearful; her insight is impaired  GU: no foley   Assessment/Plan:   # AKI  - Secondary to pre-renal and ischemic insults with hypertensive emergency in the setting of RAAS blockade.  May be  exacerbated by rapid lowering of blood pressure (however pt with chest pain which likely prompted same)   - Check strict ins/outs  - Agree with holding lisinopril (not on our outpatient EMR for her currently, neither is lasix but patient is poor historian and comes to appointments alone per charting) - See BMP is ordered for AM; check phos as well  - She has expressed never wanting dialysis  - Will update her sister tomorrow during my visit with the patient.  She has poor insight   # CKD stage V  - Secondary to DM and HTN - Baseline Cr variable as above.   - She follows with Dr. Jo[facility] at [facility].  Renal ultrasound with 6.6 cm kidneys bilaterally which are echogenic c/w CKD.  Decreased renal reserve/scarring  - She has expressed never wanting dialysis however doesn't recall these conversations.  Would advocate for family member to be present or called for follow-up appointments.  Her sister,  Teresa Poole is available for same.   # Hypertensive emergency  - Symptoms improved - BP improved  - agree with holding lisinopril   # Chest pain  - Attributed to HTN emergency per cardiology; not felt to represent acute coronary syndrome   # Renal artery stenosis  - s/p left renal artery stent in 09/2018 - Mild current left renal artery stenosis and would be less likely contributing at this point to acute picture   # Normocytic anemia  - Update iron panel  # Suicidal ideation - She made the statement to me "I'm going to commit suicide" and then repeated it - Team is arranging for a sitter   # Memory impairment  - Per primary team  - note   Disposition - would continue inpatient monitoring   Claudia Desanctis 11/06/2022, 8:18 PM

## 2022-11-07 DIAGNOSIS — N179 Acute kidney failure, unspecified: Secondary | ICD-10-CM | POA: Diagnosis not present

## 2022-11-07 DIAGNOSIS — R451 Restlessness and agitation: Secondary | ICD-10-CM

## 2022-11-07 DIAGNOSIS — F02818 Dementia in other diseases classified elsewhere, unspecified severity, with other behavioral disturbance: Secondary | ICD-10-CM | POA: Insufficient documentation

## 2022-11-07 DIAGNOSIS — I10 Essential (primary) hypertension: Secondary | ICD-10-CM | POA: Diagnosis not present

## 2022-11-07 DIAGNOSIS — I5033 Acute on chronic diastolic (congestive) heart failure: Secondary | ICD-10-CM | POA: Diagnosis not present

## 2022-11-07 DIAGNOSIS — D649 Anemia, unspecified: Secondary | ICD-10-CM | POA: Diagnosis not present

## 2022-11-07 LAB — BASIC METABOLIC PANEL
Anion gap: 12 (ref 5–15)
BUN: 53 mg/dL — ABNORMAL HIGH (ref 8–23)
CO2: 25 mmol/L (ref 22–32)
Calcium: 9 mg/dL (ref 8.9–10.3)
Chloride: 103 mmol/L (ref 98–111)
Creatinine, Ser: 4.71 mg/dL — ABNORMAL HIGH (ref 0.44–1.00)
GFR, Estimated: 9 mL/min — ABNORMAL LOW (ref >60)
Glucose, Bld: 156 mg/dL — ABNORMAL HIGH (ref 70–99)
Potassium: 4.2 mmol/L (ref 3.5–5.1)
Sodium: 140 mmol/L (ref 135–145)

## 2022-11-07 LAB — GLUCOSE, CAPILLARY
Glucose-Capillary: 211 mg/dL — ABNORMAL HIGH (ref 70–99)
Glucose-Capillary: 103 mg/dL — ABNORMAL HIGH (ref 70–99)
Glucose-Capillary: 122 mg/dL — ABNORMAL HIGH (ref 70–99)
Glucose-Capillary: 94 mg/dL (ref 70–99)

## 2022-11-07 LAB — CBC
HCT: 30.9 % — ABNORMAL LOW (ref 36.0–46.0)
Hemoglobin: 9.8 g/dL — ABNORMAL LOW (ref 12.0–15.0)
MCH: 28.2 pg (ref 26.0–34.0)
MCHC: 31.7 g/dL (ref 30.0–36.0)
MCV: 89 fL (ref 80.0–100.0)
Platelets: 254 10*3/uL (ref 150–400)
RBC: 3.47 MIL/uL — ABNORMAL LOW (ref 3.87–5.11)
RDW: 13.6 % (ref 11.5–15.5)
WBC: 5.4 10*3/uL (ref 4.0–10.5)
nRBC: 0 % (ref 0.0–0.2)

## 2022-11-07 LAB — RETICULOCYTES
Immature Retic Fract: 15 % (ref 2.3–15.9)
RBC.: 3.38 MIL/uL — ABNORMAL LOW (ref 3.87–5.11)
Retic Count, Absolute: 35.2 10*3/uL (ref 19.0–186.0)
Retic Ct Pct: 1 % (ref 0.4–3.1)

## 2022-11-07 LAB — FOLATE: Folate: 13 ng/mL (ref >5.9)

## 2022-11-07 LAB — VITAMIN B12: Vitamin B-12: 231 pg/mL (ref 180–914)

## 2022-11-07 MED ORDER — FAMOTIDINE IN NACL 20-0.9 MG/50ML-% IV SOLN
20.0000 mg | Freq: Once | INTRAVENOUS | Status: DC
  Filled 2022-11-07: qty 50

## 2022-11-07 MED ORDER — LORAZEPAM 0.5 MG PO TABS
0.2500 mg | ORAL_TABLET | Freq: Once | ORAL | Status: AC
  Administered 2022-11-07: 0.25 mg via ORAL
  Filled 2022-11-07: qty 1

## 2022-11-07 MED ORDER — CALCIUM CARBONATE ANTACID 500 MG PO CHEW
400.0000 mg | CHEWABLE_TABLET | Freq: Four times a day (QID) | ORAL | Status: DC | PRN
  Administered 2022-11-07: 400 mg via ORAL
  Filled 2022-11-07: qty 2

## 2022-11-07 MED ORDER — LORAZEPAM 2 MG/ML IJ SOLN: 0.2500 mg | Freq: Once | INTRAMUSCULAR | Status: DC

## 2022-11-07 MED ORDER — LIDOCAINE VISCOUS HCL 2 % MT SOLN
15.0000 mL | Freq: Once | OROMUCOSAL | Status: DC
  Filled 2022-11-07: qty 15

## 2022-11-07 MED ORDER — HALOPERIDOL LACTATE 5 MG/ML IJ SOLN: 1.0000 mg | Freq: Once | INTRAMUSCULAR | Status: DC

## 2022-11-07 MED ORDER — VITAMIN B-12 1000 MCG PO TABS
1000.0000 ug | ORAL_TABLET | Freq: Every day | ORAL | Status: DC
  Administered 2022-11-12 – 2022-11-13 (×2): 1000 ug via ORAL
  Filled 2022-11-07 (×2): qty 1

## 2022-11-07 MED ORDER — LORAZEPAM 2 MG/ML IJ SOLN
0.5000 mg | INTRAMUSCULAR | Status: DC | PRN
  Filled 2022-11-07: qty 1

## 2022-11-07 MED ORDER — ISOSORBIDE MONONITRATE ER 30 MG PO TB24
30.0000 mg | ORAL_TABLET | Freq: Every day | ORAL | Status: DC
  Administered 2022-11-07 – 2022-11-13 (×7): 30 mg via ORAL
  Filled 2022-11-07 (×7): qty 1

## 2022-11-07 MED ORDER — LORAZEPAM 2 MG/ML IJ SOLN
0.5000 mg | INTRAMUSCULAR | Status: AC | PRN
  Administered 2022-11-07: 0.5 mg via INTRAVENOUS

## 2022-11-07 MED ORDER — HALOPERIDOL LACTATE 5 MG/ML IJ SOLN
1.0000 mg | Freq: Once | INTRAMUSCULAR | Status: AC
  Administered 2022-11-07: 1 mg via INTRAVENOUS
  Filled 2022-11-07: qty 1

## 2022-11-07 MED ORDER — QUETIAPINE FUMARATE 25 MG PO TABS
25.0000 mg | ORAL_TABLET | Freq: Every day | ORAL | Status: DC
  Administered 2022-11-07: 25 mg via ORAL
  Filled 2022-11-07: qty 1

## 2022-11-07 MED ORDER — ALUM & MAG HYDROXIDE-SIMETH 200-200-20 MG/5ML PO SUSP
30.0000 mL | Freq: Once | ORAL | Status: AC
  Administered 2022-11-07: 30 mL via ORAL
  Filled 2022-11-07: qty 30

## 2022-11-07 MED ORDER — CYANOCOBALAMIN 1000 MCG/ML IJ SOLN
1000.0000 ug | Freq: Every day | INTRAMUSCULAR | Status: AC
  Administered 2022-11-07 – 2022-11-11 (×5): 1000 ug via INTRAMUSCULAR
  Filled 2022-11-07 (×5): qty 1

## 2022-11-07 MED ORDER — POLYSACCHARIDE IRON COMPLEX 150 MG PO CAPS
150.0000 mg | ORAL_CAPSULE | Freq: Every day | ORAL | Status: DC
  Administered 2022-11-07 – 2022-11-13 (×7): 150 mg via ORAL
  Filled 2022-11-07 (×7): qty 1

## 2022-11-07 MED ORDER — LORAZEPAM 2 MG/ML IJ SOLN: 0.5000 mg | Freq: Once | INTRAMUSCULAR | Status: DC

## 2022-11-07 NOTE — Progress Notes (Addendum)
    Patient Name: ZIANA NEMBHARD           DOB: [birth year]  MRN: [id number]      Admission Date: 11/04/2022  Attending Provider: Bonnielee Haff, MD  Primary Diagnosis: Atypical chest pain   Level of care: Progressive    CROSS COVER NOTE   Date of Service   11/07/2022   JALEESHA BUCKEL, 72 y.o. female, was admitted on 11/04/2022 for Atypical chest pain.    HPI/Events of Note   Notified by nursing staff that patient is agitated, restless, and combative.  Patient has bitten bedside RN.   Patient is wandering outside the halls, attempting to leave with just a shirt on.  Sitter in place.  However, patient is uncooperative and nursing staff is unable to redirect her to follow commands at this time.  Prolonged QTc noted in prior EKG. IV Ativan has been ordered.  Due to patient's level of agitation and combativeness, she is placing herself and staff at risk.  For the safety of the patient, restraints has been placed. Restraint to be discontinued as soon as patient is able to safely have them removed.     Interventions/ Plan   IV Ativan Restraints-bilateral upper and lower, patient is kicking        Raenette Rover, DNP, [place]

## 2022-11-07 NOTE — Progress Notes (Signed)
[facility] PROGRESS NOTE   Teresa Poole [id number] DOB: 11/19/1950 DOA: 11/04/2022  PCP: [name], MD  Brief History/Interval Summary: 72 y.o. female with medical history significant for obesity, combined chronic systolic and diastolic congestive heart failure, CKD stage IV baseline creatinine about 3, history of renal artery stenosis status post stent about 5 years ago, admitted to the hospital with uncontrolled hypertension and atypical chest pain.  Noted to have systolic blood pressures greater than 200.  There is some concern about noncompliance although patient is certain she takes all of her medications.  Apparently there have been some memory issues recently.  Patient was hospitalized for further management.    Consultants: Cardiology.  Nephrology  Procedures: Echocardiogram    Subjective/Interval History: Overnight events noted.  Noted to be quite agitated this morning.  Confused.  No family at bedside.   Assessment/Plan:  Hypertensive emergency Likely reason for her chest discomfort.  Systolic blood pressure was greater than 200.  Patient was given her antihypertensives with improvement in blood pressure.  Cardiology was consulted. She is currently on amlodipine, carvedilol.  ACE inhibitor is currently on hold due to worsening creatinine. She was given a dose of clonidine in the emergency department. Blood pressures have improved.  Renal ultrasound was done including renal vascular studies which showed some degree of left renal artery stenosis but not significant enough to cause her elevated blood pressures. Carotid Dopplers did show moderate disease.  But she is asymptomatic.  Outpatient management.  Acute agitation/delirium/suicidal ideation/history of memory impairment. Patient has underlying cognitive impairment as evidenced by history of memory impairment mentioned by her sister.  She was quite agitated overnight.  Requiring restraints.  She was  given Ativan without any effect.  Mildly prolonged QT interval is noted.  She was given a dose of Haldol.  Will monitor her QTc on a daily basis.  Not allowing telemetry leads to stay on. She mentions suicidal ideation to multiple staff members yesterday evening.  Will consult psychiatry.   CT head did not show any acute findings.  TSH was normal when checked in February. Vitamin B12 level noted to be borderline low at 231.  Will start supplementation.  Folic acid level is normal at 13.   May need further workup in the outpatient setting with referral to neurology.  Chest pain Atypical.  Likely secondary to elevated blood pressure.  Troponin trends have been flat. Echocardiogram shows diminished left ventricular EF of 45 to 50%.  No further episodes of chest pain noted.  Cardiology does not plan any further workup at this time.  Acute on chronic kidney disease stage IV Baseline creatinine is around 3.7.  Presented with a higher than baseline creatinine.  Noted to be 4.8 this morning.  Followed by Dr. Joelyn Oms with [facility].  Nephrology was consulted.  Renal function stable for the most part.  Monitor urine output.  Renal ultrasound does not show any hydronephrosis.  Lisinopril and furosemide on hold currently.  History of cardiomyopathy Echocardiogram from this admission shows left ventricular EF of 45 to 50%.  Echocardiogram from August 2023 showed EF to be 55 to 60%.  Back in 2015 she had a EF of 30 to 35%. Seems to be fairly euvolemic currently.  Lisinopril on hold due to elevated creatinine.  History of coronary artery disease This is nonobstructive based on cardiac catheterization in 2015.  Renal artery stenosis Underwent stent placement in February 2020.  1-59% stenosis of the left renal artery noted  on renal vascular study.  Not significant enough to merit any urgent intervention.   Continue statin.  Consider antiplatelet agents.  Normocytic anemia Likely anemia of  chronic disease.  No evidence of overt bleeding.  Hemoglobin seems to be close to baseline.   DVT Prophylaxis: Subcutaneous heparin Code Status: Full code Family Communication: Discussed with patient Disposition Plan: Hopefully return home when improved  Status is: Inpatient Remains inpatient appropriate because: Hypertensive emergency, acute kidney injury      Medications: Scheduled:  amLODipine  10 mg Oral Daily   carvedilol  6.25 mg Oral BID WC   docusate sodium  100 mg Oral BID   haloperidol lactate  1 mg Intramuscular Once   heparin  5,000 Units Subcutaneous Q8H   insulin aspart  0-15 Units Subcutaneous TID WC   insulin aspart  0-5 Units Subcutaneous QHS   lidocaine  15 mL Oral Once   pantoprazole  40 mg Oral Daily   QUEtiapine  25 mg Oral QHS   rosuvastatin  20 mg Oral Daily   Continuous: [id number] **OR** acetaminophen, calcium carbonate, labetalol, methocarbamol, ondansetron **OR** ondansetron (ZOFRAN) IV, oxyCODONE, polyethylene glycol, traZODone  Antibiotics: Anti-infectives (From admission, onward)    None       Objective:  Vital Signs  Vitals:   11/06/22 0902 11/06/22 1328 11/06/22 1342 11/06/22 2143  BP: (!) 147/63 (!) 157/64 138/72 138/60  Pulse: 71 69 70 70  Resp:   18 18  Temp:   98.2 F (36.8 C) (!) 100.9 F (38.3 C)  TempSrc:   Oral Oral  SpO2:   100% 100%  Weight:      Height:        Intake/Output Summary (Last 24 hours) at 11/07/2022 0856 Last data filed at 11/06/2022 1907 Gross per 24 hour  Intake 360 ml  Output 400 ml  Net -40 ml    Filed Weights   11/04/22 2048 11/06/22 0551  Weight: 74.8 kg 70.3 kg    General appearance: Patient noted to be agitated. Resp: Clear to auscultation bilaterally.  Normal effort Cardio: S1-S2 is normal regular.  No S3-S4.  No rubs murmurs or bruit GI: Abdomen is soft.  Nontender nondistended.  Bowel sounds are present normal.  No masses organomegaly Extremities: No edema.  Full range of  motion of lower extremities. Neurologic: Agitated.  No focal deficits noted.   Lab Results:  Data Reviewed: I have personally reviewed following labs and reports of the imaging studies  CBC: Recent Labs  Lab 11/02/22 1700 11/04/22 2129 11/05/22 1100 11/06/22 0744 11/07/22 0446  WBC 5.6 5.5 4.7 4.3 5.4  NEUTROABS 3.5  --   --   --   --   HGB 9.6* 9.1* 8.6* 9.2* 9.8*  HCT 30.1* 29.1* 27.3* 29.4* 30.9*  MCV 88.5 89.3 89.2 89.1 89.0  PLT 238 228 202 222 254     Basic Metabolic Panel: Recent Labs  Lab 11/02/22 1700 11/04/22 2129 11/05/22 1100 11/06/22 0744 11/07/22 0446  NA 143 139  --  141 140  K 4.4 4.0  --  4.0 4.2  CL 107 104  --  105 103  CO2 21* 24  --  26 25  GLUCOSE 109* 113*  --  119* 156*  BUN 39* 38*  --  45* 53*  CREATININE 4.61* 4.61* 4.71* 4.80* 4.71*  CALCIUM 8.9 8.8*  --  8.7* 9.0  MG 1.9  --   --   --   --  GFR: Estimated Creatinine Clearance: 10.4 mL/min (A) (by C-G formula based on SCr of 4.71 mg/dL (H)).  Liver Function Tests: Recent Labs  Lab 11/02/22 1700  AST 19  ALT 11  ALKPHOS 51  BILITOT 0.3  PROT 6.4*  ALBUMIN 3.2*      HbA1C: Recent Labs    11/05/22 1100  HGBA1C 6.3*     CBG: Recent Labs  Lab 11/06/22 1139 11/06/22 1706 11/06/22 1728 11/06/22 2141 11/07/22 0744  GLUCAP 99 178* 169* 160* 211*       Radiology Studies: VAS US RENAL ARTERY DUPLEX  Result Date: 11/06/2022 ABDOMINAL VISCERAL Patient Name:  SHANTASIA FONNESBECK  Date of Exam:   11/06/2022 Medical Rec #: [id number]           Accession #:    [id number] Date of Birth: October 11, 1950           Patient Gender: F Patient Age:   73 years Exam Location:  [facility] Procedure:      VAS US RENAL ARTERY DUPLEX Referring Phys: [id number] Bonnielee Haff -------------------------------------------------------------------------------- Indications: Hypertension High Risk Factors: Hypertension, hyperlipidemia, Diabetes. Vascular Interventions: Left renal artery  stent. Limitations: Air/bowel gas. Comparison Study: No prior studies. Performing Technologist: Oliver Hum RVT  Examination Guidelines: A complete evaluation includes B-mode imaging, spectral Doppler, color Doppler, and power Doppler as needed of all accessible portions of each vessel. Bilateral testing is considered an integral part of a complete examination. Limited examinations for reoccurring indications may be performed as noted.  Duplex Findings: +--------------------+--------+--------+------+------------------+ Mesenteric          PSV cm/sEDV cm/sPlaque     Comments      +--------------------+--------+--------+------+------------------+ Aorta Mid              58      8                             +--------------------+--------+--------+------+------------------+ Celiac Artery Origin                      Unable to insonate +--------------------+--------+--------+------+------------------+ SMA Origin                                Unable to insonate +--------------------+--------+--------+------+------------------+    +------------------+--------+--------+-------+ Right Renal ArteryPSV cm/sEDV cm/sComment +------------------+--------+--------+-------+ Origin              108      14           +------------------+--------+--------+-------+ Proximal             74      13           +------------------+--------+--------+-------+ Mid                 110      13           +------------------+--------+--------+-------+ Distal               81      10           +------------------+--------+--------+-------+ +-----------------+--------+--------+-------+ Left Renal ArteryPSV cm/sEDV cm/sComment +-----------------+--------+--------+-------+ Origin             112      15           +-----------------+--------+--------+-------+ Proximal           190      32           +-----------------+--------+--------+-------+  Mid                 38      7             +-----------------+--------+--------+-------+ Distal              35      6            +-----------------+--------+--------+-------+ +------------+--------+--------+----+-----------+--------+--------+----+ Right KidneyPSV cm/sEDV cm/sRI  Left KidneyPSV cm/sEDV cm/sRI   +------------+--------+--------+----+-----------+--------+--------+----+ Upper Pole  30      5       0.82Upper Pole 33      6       0.82 +------------+--------+--------+----+-----------+--------+--------+----+ Mid         26      5       0.80Mid        37      8       0.79 +------------+--------+--------+----+-----------+--------+--------+----+ Lower Pole  20      5       0.77Lower Pole 24      10      0.59 +------------+--------+--------+----+-----------+--------+--------+----+ Hilar       96      10      0.90Hilar      31      7       0.77 +------------+--------+--------+----+-----------+--------+--------+----+ +------------------+----+------------------+----+ Right Kidney          Left Kidney            +------------------+----+------------------+----+ RAR                   RAR                    +------------------+----+------------------+----+ RAR (manual)      1.9 RAR (manual)      3.3  +------------------+----+------------------+----+ Cortex                Cortex                 +------------------+----+------------------+----+ Cortex thickness      Corex thickness        +------------------+----+------------------+----+ Kidney length (cm)9.50Kidney length (cm)8.50 +------------------+----+------------------+----+  Summary: Renal:  Right: Normal size right kidney. Abnormal right Resistive Index. No        evidence of right renal artery stenosis. Left:  Normal size of left kidney. Abnormal left Resisitve Index.        1-59% stenosis of the left renal artery.  *See table(s) above for measurements and observations.  Diagnosing physician: Servando Snare MD   Electronically signed by Servando Snare MD on 11/06/2022 at 4:53:43 PM.    Final    ECHOCARDIOGRAM COMPLETE  Result Date: 11/06/2022    ECHOCARDIOGRAM REPORT   Patient Name:   ARRIHANNA ALBERICI Date of Exam: 11/06/2022 Medical Rec #:  [id number]          Height:       66.0 in Accession #:    [id number]         Weight:       155.0 lb Date of Birth:  [birth year]          BSA:          1.794 m Patient Age:    12 years           BP:           147/63 mmHg Patient Gender: F  HR:           69 bpm. Exam Location:  Inpatient Procedure: 2D Echo, Color Doppler and Cardiac Doppler Indications:    R07.9* Chest pain, unspecified  History:        Patient has prior history of Echocardiogram examinations. CHF,                 CAD, Arrythmias:RBBB; Risk Factors:Dyslipidemia, Diabetes and                 Hypertension.  Sonographer:    Phineas Douglas Referring Phys: [id number] Jordan  1. Left ventricular ejection fraction, by estimation, is 45 to 50%. The left ventricle has mildly decreased function. The left ventricle demonstrates global hypokinesis. There is mild concentric left ventricular hypertrophy. Left ventricular diastolic parameters are consistent with Grade II diastolic dysfunction (pseudonormalization).  2. Right ventricular systolic function is normal. The right ventricular size is normal.  3. Left atrial size was moderately dilated.  4. The mitral valve is grossly normal. Trivial mitral valve regurgitation. No evidence of mitral stenosis.  5. The aortic valve is tricuspid. There is mild calcification of the aortic valve. There is mild thickening of the aortic valve. Aortic valve regurgitation is trivial. Aortic valve sclerosis/calcification is present, without any evidence of aortic stenosis.  6. The inferior vena cava is normal in size with greater than 50% respiratory variability, suggesting right atrial pressure of 3 mmHg. Comparison(s): Prior images reviewed side by side. Changes from  prior study are noted. Conclusion(s)/Recommendation(s): Mildly reduced LVEF compared to prior, in a pattern of global hypokinesis. No focal wall motion abnormalities noted. FINDINGS  Left Ventricle: Left ventricular ejection fraction, by estimation, is 45 to 50%. The left ventricle has mildly decreased function. The left ventricle demonstrates global hypokinesis. The left ventricular internal cavity size was normal in size. There is  mild concentric left ventricular hypertrophy. Left ventricular diastolic parameters are consistent with Grade II diastolic dysfunction (pseudonormalization). Right Ventricle: The right ventricular size is normal. No increase in right ventricular wall thickness. Right ventricular systolic function is normal. Left Atrium: Left atrial size was moderately dilated. Right Atrium: Right atrial size was normal in size. Pericardium: Trivial pericardial effusion is present. Mitral Valve: The mitral valve is grossly normal. Trivial mitral valve regurgitation. No evidence of mitral valve stenosis. Tricuspid Valve: The tricuspid valve is grossly normal. Tricuspid valve regurgitation is trivial. No evidence of tricuspid stenosis. Aortic Valve: The aortic valve is tricuspid. There is mild calcification of the aortic valve. There is mild thickening of the aortic valve. Aortic valve regurgitation is trivial. Aortic regurgitation PHT measures 395 msec. Aortic valve sclerosis/calcification is present, without any evidence of aortic stenosis. Pulmonic Valve: The pulmonic valve was not well visualized. Pulmonic valve regurgitation is not visualized. No evidence of pulmonic stenosis. Aorta: The aortic root, ascending aorta, aortic arch and descending aorta are all structurally normal, with no evidence of dilitation or obstruction. Venous: The inferior vena cava is normal in size with greater than 50% respiratory variability, suggesting right atrial pressure of 3 mmHg. IAS/Shunts: The atrial septum is grossly  normal.  LEFT VENTRICLE PLAX 2D LVIDd:         5.00 cm      Diastology LVIDs:         3.90 cm      LV e' medial:    4.68 cm/s LV PW:         1.20 cm      LV E/e'  medial:  24.4 LV IVS:        1.20 cm      LV e' lateral:   5.22 cm/s LVOT diam:     1.90 cm      LV E/e' lateral: 21.8 LV SV:         62 LV SV Index:   34 LVOT Area:     2.84 cm  LV Volumes (MOD) LV vol d, MOD A2C: 121.0 ml LV vol d, MOD A4C: 136.0 ml LV vol s, MOD A2C: 61.2 ml LV vol s, MOD A4C: 71.0 ml LV SV MOD A2C:     59.8 ml LV SV MOD A4C:     136.0 ml LV SV MOD BP:      65.0 ml RIGHT VENTRICLE             IVC RV Basal diam:  3.70 cm     IVC diam: 1.90 cm RV S prime:     12.10 cm/s TAPSE (M-mode): 2.0 cm LEFT ATRIUM             Index        RIGHT ATRIUM           Index LA diam:        3.70 cm 2.06 cm/m   RA Area:     14.40 cm LA Vol (A2C):   83.9 ml 46.76 ml/m  RA Volume:   34.40 ml  19.17 ml/m LA Vol (A4C):   67.0 ml 37.34 ml/m LA Biplane Vol: 75.3 ml 41.96 ml/m  AORTIC VALVE LVOT Vmax:   91.40 cm/s LVOT Vmean:  64.500 cm/s LVOT VTI:    0.217 m AI PHT:      395 msec  AORTA Ao Root diam: 2.80 cm Ao Asc diam:  3.10 cm MITRAL VALVE MV Area (PHT): 2.84 cm     SHUNTS MV Decel Time: 267 msec     Systemic VTI:  0.22 m MV E velocity: 114.00 cm/s  Systemic Diam: 1.90 cm MV A velocity: 124.00 cm/s MV E/A ratio:  0.92 Buford Dresser MD Electronically signed by Buford Dresser MD Signature Date/Time: 11/06/2022/2:58:31 PM    Final    US RENAL  Result Date: 11/06/2022 CLINICAL DATA:  Acute kidney injury EXAM: RENAL / URINARY TRACT ULTRASOUND COMPLETE COMPARISON:  None Available. FINDINGS: Right Kidney: Renal measurements: 6.6 x 4.2 x 3.2 cm = volume: 46.4 mL. No hydronephrosis. Increased renal cortical echogenicity. Small simple cysts measuring up to 1.7 and 1.8 cm. Left Kidney: Renal measurements: 6.6 x 3.9 x 3.4 cm = volume: 46.1 mL. No hydronephrosis. Increased renal cortical echogenicity. Bladder: Partially obscured by bowel gas.   Appears mildly distended. Other: None. IMPRESSION: No hydronephrosis. Increased renal cortical echogenicity bilaterally, as can be seen in medical renal disease. Electronically Signed   By: Maurine Simmering M.D.   On: 11/06/2022 12:37   VAS US CAROTID  Result Date: 11/05/2022 Carotid Arterial Duplex Study Patient Name:  ZIAIRA HAKE  Date of Exam:   11/05/2022 Medical Rec #: [id number]           Accession #:    [id number] Date of Birth: 27-Jan-1951           Patient Gender: F Patient Age:   83 years Exam Location:  [facility] Procedure:      VAS US CAROTID Referring Phys: MIR [facility] --------------------------------------------------------------------------------  Indications:       History of left ICA stenosis, left neck and ear pain. Risk  Factors:      Hypertension, hyperlipidemia, Diabetes, no history of                    smoking, mild coronary artery disease. Other Factors:     Renal artery stenosis. Comparison Study:  07-01-2022 MRA Neck indicating approximately 50% left ICA                    stenosis and severe left vertebral artery stenosis. Performing Technologist: Darlin Coco RDMS, RVT  Examination Guidelines: A complete evaluation includes B-mode imaging, spectral Doppler, color Doppler, and power Doppler as needed of all accessible portions of each vessel. Bilateral testing is considered an integral part of a complete examination. Limited examinations for reoccurring indications may be performed as noted.  Right Carotid Findings: +----------+--------+--------+--------+-------------------+--------------------+           PSV cm/sEDV cm/sStenosisPlaque Description Comments             +----------+--------+--------+--------+-------------------+--------------------+ CCA Prox  76      12                                                      +----------+--------+--------+--------+-------------------+--------------------+ CCA Distal81      11                                  intimal thickening   +----------+--------+--------+--------+-------------------+--------------------+ ICA Prox  166     37      1-39%   calcific and       Upper range-                                           heterogenous       borderline 40-59%    +----------+--------+--------+--------+-------------------+--------------------+ ICA Mid   104     26                                                      +----------+--------+--------+--------+-------------------+--------------------+ ICA Distal103     29                                                      +----------+--------+--------+--------+-------------------+--------------------+ +----------+--------+-------+---------+-------------------+           PSV cm/sEDV cmsDescribe Arm Pressure (mmHG) +----------+--------+-------+---------+-------------------+ Subclavian70             Turbulent                    +----------+--------+-------+---------+-------------------+ +---------+--------+---+--------+--+---------+ VertebralPSV cm/s117EDV cm/s23Antegrade +---------+--------+---+--------+--+---------+  Left Carotid Findings: +----------+--------+--------+--------+----------------------------+-----------+           PSV cm/sEDV cm/sStenosisPlaque Description          Comments    +----------+--------+--------+--------+----------------------------+-----------+ CCA Prox  92      14                                                      +----------+--------+--------+--------+----------------------------+-----------+  CCA Distal103     20      <50%    heterogenous                            +----------+--------+--------+--------+----------------------------+-----------+ ICA Prox  213     51      40-59%  calcific, heterogenous and  Upper range                                   irregular                               +----------+--------+--------+--------+----------------------------+-----------+  ICA Mid   145     29                                                      +----------+--------+--------+--------+----------------------------+-----------+ ICA Distal150     32                                                      +----------+--------+--------+--------+----------------------------+-----------+ ECA       64                                                              +----------+--------+--------+--------+----------------------------+-----------+ +----------+--------+--------+---------+-------------------+           PSV cm/sEDV cm/sDescribe Arm Pressure (mmHG) +----------+--------+--------+---------+-------------------+ [id number]             Turbulent                    +----------+--------+--------+---------+-------------------+ +---------+--------+--+--------+--+---------+ VertebralPSV cm/s47EDV cm/s11Antegrade +---------+--------+--+--------+--+---------+   Summary: Right Carotid: Velocities in the right ICA are consistent with a 1-39% stenosis. Left Carotid: Velocities in the left ICA are consistent with a 40-59% stenosis.               Non-hemodynamically significant plaque <50% noted in the CCA. Vertebrals:  Bilateral vertebral arteries demonstrate antegrade flow. Subclavians: Bilateral subclavian artery flow was disturbed. *See table(s) above for measurements and observations.  Electronically signed by Antony Contras MD on 11/05/2022 at 12:13:57 PM.    Final        LOS: 2 days   [facility] Pager on [email or web address]  11/07/2022, 8:56 AM

## 2022-11-07 NOTE — Consult Note (Signed)
[facility] Consult   Reason for Consult:  Agitation Referring Physician:  Dr. [place] Pink Patient Identification: Teresa Poole MRN:  [id number] Principal Diagnosis: Atypical chest pain Diagnosis:  Principal Problem:   Atypical chest pain Active Problems:   Hypertension associated with diabetes ([place])   Type 2 diabetes mellitus with chronic kidney disease, without long-term current use of insulin ([place])   Hypertensive urgency   Cardiomyopathy, hypertensive ([place])   Anemia of chronic kidney failure, stage 4 (severe) (HCC)   Chronic combined systolic and diastolic heart failure (HCC)   Hyperlipidemia associated with type 2 diabetes mellitus ([place])   Renal artery stenosis (HCC)   (HFpEF) heart failure with preserved ejection fraction (HCC)   Acute kidney injury ([place])   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 female patient admitted with Atypical chest pain.  HPI:   Patient seen laying in bed this afternoon accompanied by sitter. The patient is a poor historian and reports that she is currently in the hospital because, "the doctors brought me here to get money." When asked how she arrived to the hospital she reported that she lost consciousness and ended up in the hospital without knowing why.   The patient was also asked about her need for restraints earlier in the shift. She attributed this to a disagreement she had with the hospital staff. She was attempting to get ready for discharge and she could not find her clothes. The patient had no insight into her need for hospitalization or the issues with her kidney function.  She denied any previous psychiatric history however the patient's sister reports that the patient has seen psychiatry before but refused to adhere to the recommendation of starting Zyprexa that was made.    She admits to making threats of not wanting to live but states that she says things like that when she is frustrated. She denies any true plan or intent to harm herself or anyone else.   She reports good sleep at night but her sitter at bedside reports that the patient was up all night and did not get any rest until she received PRN Haldol.  Past Psychiatric History: Denies but per sister the patient was prescribed Zyprexa  Collateral: Trenia Golinski Sister [phone] Called twice no answer  Past Medical History:  Past Medical History:  Diagnosis Date   Allergy    Anemia    Carotid stenosis    Chronic combined systolic and diastolic CHF (congestive heart failure) (HCC)    CKD (chronic kidney disease), stage IV ([place]) 09/24/2013   Pt at [facility], Dr. Joelyn Oms   COVID-19 10/24/2021   Diabetes mellitus type 2 in nonobese [facility])    Edema 09/14/2013   GERD (gastroesophageal reflux disease)    History of ovarian cancer    Hyperlipidemia    Hypertension    Mild CAD    non-obstructive by LHC (09/25/2013): Proximal and mid LAD serial 20%, proximal circumflex 30%, mid AV groove circumflex 30%, mid RCA mild plaque.   NICM (nonischemic cardiomyopathy) (HCC)    Obesity (BMI 30-39.9)    RBBB    Renal artery stenosis ([place]) 10/11/2018   Thyroid disease    Seen by specialist    Past Surgical History:  Procedure Laterality Date   LEFT HEART CATHETERIZATION WITH CORONARY ANGIOGRAM N/A 09/25/2013   Procedure: LEFT HEART CATHETERIZATION WITH CORONARY ANGIOGRAM;  Surgeon: Burnell Blanks, MD;  Location: [facility] CATH LAB;  Service: Cardiovascular;  Laterality: N/A;   RENAL ANGIOGRAPHY N/A 10/06/2018   Procedure: RENAL ANGIOGRAPHY;  Surgeon: Marty Heck, MD;  Location: [facility] CV LAB;  Service: Cardiovascular;  Laterality: N/A;   Family Psychiatric  History: Denies Social History:  Social History   Substance and Sexual Activity  Alcohol Use No     Social History    Substance and Sexual Activity  Drug Use No    Social History   Socioeconomic History   Marital status: Single    Spouse name: Not on file   Number of children: 0   Years of education: Not on file   Highest education level: Not on file  Occupational History    Employer: [employer]  Tobacco Use   Smoking status: Never   Smokeless tobacco: Never  Vaping Use   Vaping Use: Never used  Substance and Sexual Activity   Alcohol use: No   Drug use: No   Sexual activity: Not on file  Other Topics Concern   Not on file  Social History Narrative   Works at [employer]   Patient lives at home alone.    Patient has no children.    Patient patient is right handed.    Patient is single.    Social Determinants of Health   Financial Resource Strain: Not on file  Food Insecurity: No Food Insecurity (11/05/2022)   Hunger Vital Sign    Worried About Running Out of Food in the Last Year: Never true    Ran Out of Food in the Last Year: Never true  Transportation Needs: No Transportation Needs (11/05/2022)   PRAPARE - Hydrologist (Medical): No    Lack of Transportation (Non-Medical): No  Physical Activity: Not on file  Stress: Not on file  Social Connections: Not on file   Additional Social History:    Allergies:   Allergies  Allergen Reactions   Hydralazine Hcl Itching    ENTIRE BODY = burning sensation, also in the eyes (they have become very sensitive to light)   Atorvastatin Other (See Comments)    Per MD - pt not sure of reaction     Labs:  Results for orders placed or performed during the hospital encounter of 11/04/22 (from the past 48 hour(s))  CBG monitoring, ED     Status: Abnormal   Collection Time: 11/05/22  4:11 PM  Result Value Ref Range   Glucose-Capillary 119 (H) 70 - 99 mg/dL    Comment: Glucose reference range applies only to samples taken after fasting for at least 8 hours.  Glucose, capillary     Status: Abnormal   Collection  Time: 11/05/22  9:47 PM  Result Value Ref Range   Glucose-Capillary 169 (H) 70 - 99 mg/dL    Comment: Glucose reference range applies only to samples taken after fasting for at least 8 hours.  Troponin I (High Sensitivity)     Status: Abnormal   Collection Time: 11/05/22 10:46 PM  Result Value Ref Range   Troponin I (High Sensitivity) 39 (H) <18 ng/L    Comment: (NOTE) Elevated high sensitivity troponin I (hsTnI) values and significant  changes across serial measurements may suggest ACS but many other  chronic and acute conditions are known to elevate hsTnI results.  Refer to the "Links" section for chest pain algorithms and additional  guidance. Performed at [facility],  [place] [address] [place], [place] [address]   Glucose, capillary     Status: Abnormal   Collection Time: 11/06/22  7:41 AM  Result Value Ref Range   Glucose-Capillary 125 (H) 70 - 99 mg/dL    Comment: Glucose reference range applies only to samples taken after fasting for at least 8 hours.  CBC     Status: Abnormal   Collection Time: 11/06/22  7:44 AM  Result Value Ref Range   WBC 4.3 4.0 - 10.5 K/uL   RBC 3.30 (L) 3.87 - 5.11 MIL/uL   Hemoglobin 9.2 (L) 12.0 - 15.0 g/dL   HCT 29.4 (L) 36.0 - 46.0 %   MCV 89.1 80.0 - 100.0 fL   MCH 27.9 26.0 - 34.0 pg   MCHC 31.3 30.0 - 36.0 g/dL   RDW 13.7 11.5 - 15.5 %   Platelets 222 150 - 400 K/uL   nRBC 0.0 0.0 - 0.2 %    Comment: Performed at [facility], [place] [address], [place], [place] [id number]  Basic metabolic panel     Status: Abnormal   Collection Time: 11/06/22  7:44 AM  Result Value Ref Range   Sodium 141 135 - 145 mmol/L   Potassium 4.0 3.5 - 5.1 mmol/L   Chloride 105 98 - 111 mmol/L   CO2 26 22 - 32 mmol/L   Glucose, Bld 119 (H) 70 - 99 mg/dL    Comment: Glucose reference range applies only to samples taken after fasting for at least 8 hours.   BUN 45 (H) 8 - 23 mg/dL   Creatinine, Ser 4.80 (H) 0.44 - 1.00 mg/dL    Calcium 8.7 (L) 8.9 - 10.3 mg/dL   GFR, Estimated 9 (L) >60 mL/min    Comment: (NOTE) Calculated using the CKD-EPI Creatinine Equation (2021)    Anion gap 10 5 - 15    Comment: Performed at [facility], [place] [address] [place], [place] [address]  Glucose, capillary     Status: None   Collection Time: 11/06/22 11:39 AM  Result Value Ref Range   Glucose-Capillary 99 70 - 99 mg/dL    Comment: Glucose reference range applies only to samples taken after fasting for at least 8 hours.  Glucose, capillary     Status: Abnormal   Collection Time: 11/06/22  5:06 PM  Result Value Ref Range   Glucose-Capillary 178 (H) 70 - 99 mg/dL    Comment: Glucose reference range applies only to samples taken after fasting for at least 8 hours.  Glucose, capillary     Status: Abnormal   Collection Time: 11/06/22  5:28 PM  Result Value Ref Range   Glucose-Capillary 169 (H) 70 - 99 mg/dL    Comment: Glucose reference range applies only to samples taken after fasting for at least 8 hours.  Iron and TIBC     Status: None   Collection Time: 11/06/22  7:29 PM  Result Value Ref Range   Iron 33 28 - 170 ug/dL   TIBC 300 250 - 450 ug/dL   Saturation Ratios 11 10.4 - 31.8 %   UIBC 267 ug/dL    Comment: Performed at [facility], [place] [address] [place], [place] [address]  Ferritin     Status: None   Collection Time: 11/06/22  7:29 PM  Result Value Ref Range   Ferritin 21 11 - 307 ng/mL    Comment: Performed at [facility], [place] [address] [place],  [place] [address]  Glucose, capillary     Status: Abnormal   Collection Time: 11/06/22  9:41 PM  Result Value Ref Range   Glucose-Capillary 160 (H) 70 - 99 mg/dL    Comment: Glucose reference range applies only to samples taken after fasting for at least 8 hours.  CBC     Status: Abnormal   Collection Time: 11/07/22  4:46 AM  Result Value Ref Range   WBC 5.4 4.0 - 10.5 K/uL   RBC 3.47 (L) 3.87 - 5.11 MIL/uL    Hemoglobin 9.8 (L) 12.0 - 15.0 g/dL   HCT 30.9 (L) 36.0 - 46.0 %   MCV 89.0 80.0 - 100.0 fL   MCH 28.2 26.0 - 34.0 pg   MCHC 31.7 30.0 - 36.0 g/dL   RDW 13.6 11.5 - 15.5 %   Platelets 254 150 - 400 K/uL   nRBC 0.0 0.0 - 0.2 %    Comment: Performed at [facility], [place] [address] [place], [place] [id number]  Basic metabolic panel     Status: Abnormal   Collection Time: 11/07/22  4:46 AM  Result Value Ref Range   Sodium 140 135 - 145 mmol/L   Potassium 4.2 3.5 - 5.1 mmol/L   Chloride 103 98 - 111 mmol/L   CO2 25 22 - 32 mmol/L   Glucose, Bld 156 (H) 70 - 99 mg/dL    Comment: Glucose reference range applies only to samples taken after fasting for at least 8 hours.   BUN 53 (H) 8 - 23 mg/dL   Creatinine, Ser 4.71 (H) 0.44 - 1.00 mg/dL   Calcium 9.0 8.9 - 10.3 mg/dL   GFR, Estimated 9 (L) >60 mL/min    Comment: (NOTE) Calculated using the CKD-EPI Creatinine Equation (2021)    Anion gap 12 5 - 15    Comment: Performed at [facility], [place] [address], [place], [place] [address]  Vitamin B12     Status: None   Collection Time: 11/07/22  4:46 AM  Result Value Ref Range   Vitamin B-12 231 180 - 914 pg/mL    Comment: (NOTE) This assay is not validated for testing neonatal or myeloproliferative syndrome specimens for Vitamin B12 levels. Performed at [facility] [place] [address], [place], [place] [address]   Folate     Status: None   Collection Time: 11/07/22  4:46 AM  Result Value Ref Range   Folate 13.0 >5.9 ng/mL    Comment: Performed at [facility], [place] [address] [place], [place] [address]  Reticulocytes     Status: Abnormal   Collection Time: 11/07/22  4:46 AM  Result Value Ref Range   Retic Ct Pct 1.0 0.4 - 3.1 %   RBC. 3.38 (L) 3.87 - 5.11 MIL/uL   Retic Count, Absolute 35.2 19.0 - 186.0 K/uL   Immature Retic Fract 15.0 2.3 - 15.9 %    Comment: Performed at [facility], [place] [address] [place], [place] [address]  Glucose, capillary     Status: Abnormal   Collection Time: 11/07/22  7:44 AM  Result Value Ref Range   Glucose-Capillary 211 (H) 70 - 99 mg/dL    Comment: Glucose reference range applies only to samples taken after fasting for at least 8 hours.  Glucose, capillary     Status: Abnormal   Collection Time: 11/07/22 10:50 AM  Result Value Ref Range   Glucose-Capillary 103 (H) 70 - 99 mg/dL  Comment: Glucose reference range applies only to samples taken after fasting for at least 8 hours.    Current Facility-Administered Medications  Medication Dose Route Frequency Provider Last Rate Last Admin   acetaminophen (TYLENOL) tablet 650 mg  650 mg Oral Q6H PRN Hollice Gong, Mir M, MD       Or   acetaminophen (TYLENOL) suppository 650 mg  650 mg Rectal Q6H PRN Hollice Gong, Mir M, MD       amLODipine (NORVASC) tablet 10 mg  10 mg Oral Daily Tobb, Kardie, DO   10 mg at 11/07/22 1014   calcium carbonate (TUMS - dosed in mg elemental calcium) chewable tablet 400 mg of elemental calcium  400 mg of elemental calcium Oral Q6H PRN Raenette Rover, NP   400 mg of elemental calcium at 11/07/22 0341   carvedilol (COREG) tablet 6.25 mg  6.25 mg Oral BID WC Vikki Ports R, PA-C   6.25 mg at 11/07/22 1014   cyanocobalamin (VITAMIN B12) injection 1,000 mcg  1,000 mcg Intramuscular Daily Bonnielee Haff, MD   1,000 mcg at 11/07/22 1343   Followed by   Derrill Memo ON 11/12/2022] cyanocobalamin (VITAMIN B12) tablet 1,000 mcg  1,000 mcg Oral Daily Bonnielee Haff, MD       docusate sodium (COLACE) capsule 100 mg  100 mg Oral BID Hollice Gong, Mir M, MD   100 mg at 11/07/22 1014   haloperidol lactate (HALDOL) injection 1 mg  1 mg Intramuscular Once Bonnielee Haff, MD       heparin injection 5,000 Units  5,000 Units Subcutaneous Q8H Hollice Gong, Mir M, MD   5,000 Units at 11/07/22 1350   insulin aspart (novoLOG) injection 0-15 Units  0-15 Units Subcutaneous TID WC Hollice Gong, Mir M, MD    5 Units at 11/07/22 [id number]   insulin aspart (novoLOG) injection 0-5 Units  0-5 Units Subcutaneous QHS Hollice Gong, Mir M, MD       labetalol (NORMODYNE) injection 10 mg  10 mg Intravenous Q2H PRN Hollice Gong, Mir M, MD   10 mg at 11/07/22 1018   lidocaine (XYLOCAINE) 2 % viscous mouth solution 15 mL  15 mL Oral Once Raenette Rover, NP       methocarbamol (ROBAXIN) tablet 750 mg  750 mg Oral Q6H PRN Hollice Gong, Mir M, MD   750 mg at 11/07/22 1014   ondansetron (ZOFRAN) tablet 4 mg  4 mg Oral Q6H PRN Hollice Gong, Mir M, MD       Or   ondansetron [facility]) injection 4 mg  4 mg Intravenous Q6H PRN Hollice Gong, Mir M, MD       oxyCODONE (Oxy IR/ROXICODONE) immediate release tablet 5 mg  5 mg Oral Q4H PRN Hollice Gong, Mir M, MD   5 mg at 11/07/22 1014   pantoprazole (PROTONIX) EC tablet 40 mg  40 mg Oral Daily Hollice Gong, Mir M, MD   40 mg at 11/07/22 1014   polyethylene glycol (MIRALAX / GLYCOLAX) packet 17 g  17 g Oral Daily PRN Hollice Gong, Mir M, MD       QUEtiapine (SEROQUEL) tablet 25 mg  25 mg Oral QHS Bonnielee Haff, MD       rosuvastatin (CRESTOR) tablet 20 mg  20 mg Oral Daily Hollice Gong, Mir M, MD   20 mg at 11/07/22 1014   traZODone (DESYREL) tablet 25 mg  25 mg Oral QHS PRN Lucillie Garfinkel, MD        Psychiatric Specialty Exam:  Presentation  General Appearance:  Appropriate for Environment  Eye Contact: Minimal  Speech: Slow  Speech Volume: Decreased  Handedness:No data recorded  Mood and Affect  Mood: -- (ok)  Affect: Flat   Thought Process  Thought Processes: -- (mostly linear)  Descriptions of Associations:Intact  Orientation:-- (oriented to person and place. Disoriented to time and situation)  Thought Content:Illogical  History of Schizophrenia/Schizoaffective disorder:No data recorded Duration of Psychotic Symptoms:No data recorded Hallucinations:Hallucinations: None  Ideas of Reference:None  Suicidal Thoughts:Suicidal Thoughts: No  Homicidal  Thoughts:Homicidal Thoughts: No   Sensorium  Memory: Recent Poor; Remote Good  Judgment: Poor  Insight: Poor   Executive Functions  Concentration: Fair  Attention Span: Fair  Recall: Daisy of Knowledge: Fair  Language: Fair   Psychomotor Activity  Psychomotor Activity:No data recorded  Assets  Assets:No data recorded  Sleep  Sleep: Sleep: Poor   Physical Exam: Physical Exam ROS Blood pressure (!) 168/81, pulse 67, temperature 97.9 F (36.6 C), temperature source Oral, resp. rate 17, height 5\' 6"  (1.676 m), weight 70.3 kg, SpO2 100 %. Body mass index is 25.01 kg/m.  Treatment Plan Summary:  Major Neurocognitive Disorder with behavioral disturbance -Increase Seroquel 100 mg PO QHS  -Initiate Zyprexa PO/IM if patient refuses PO 2.5 mg PO QID for non redirectable agitation  Disposition:  -Would not recommended IVC at this time however if the patient attempts to leave AMA it would be appropriate to initiate IVC at that time -Continue 1:1 Sitter -Will try and reach patient's sister for collateral information  Pecolia Ades, DO 11/07/2022 3:20 PM

## 2022-11-07 NOTE — Progress Notes (Signed)
Another consult was placed to the IV Nurse for new access, as the pt pulled out the iv that had been placed at 0610;  pt in restraints; multiple staff at bedside; pt using profanity ; new iv started, and wrapped with gauze kling to help prevent her from pulling out or biting at the site.

## 2022-11-07 NOTE — Progress Notes (Signed)
Rounding Note    Patient Name: Teresa Poole Date of Encounter: 11/07/2022  [place] Cardiologist: Larae Grooms, MD   Subjective  She is irate this AM. She is in restraints 2/2 sun downing/aggression overnight   Inpatient Medications    Scheduled Meds:  amLODipine  10 mg Oral Daily   carvedilol  6.25 mg Oral BID WC   docusate sodium  100 mg Oral BID   haloperidol lactate  1 mg Intramuscular Once   heparin  5,000 Units Subcutaneous Q8H   insulin aspart  0-15 Units Subcutaneous TID WC   insulin aspart  0-5 Units Subcutaneous QHS   lidocaine  15 mL Oral Once   pantoprazole  40 mg Oral Daily   QUEtiapine  25 mg Oral QHS   rosuvastatin  20 mg Oral Daily   Continuous Infusions:  PRN Meds: acetaminophen **OR** acetaminophen, calcium carbonate, labetalol, methocarbamol, ondansetron **OR** ondansetron (ZOFRAN) IV, oxyCODONE, polyethylene glycol, traZODone   Vital Signs    Vitals:   11/06/22 0902 11/06/22 1328 11/06/22 1342 11/06/22 2143  BP: (!) 147/63 (!) 157/64 138/72 138/60  Pulse: 71 69 70 70  Resp:   18 18  Temp:   98.2 F (36.8 C) (!) 100.9 F (38.3 C)  TempSrc:   Oral Oral  SpO2:   100% 100%  Weight:      Height:        Intake/Output Summary (Last 24 hours) at 11/07/2022 0839 Last data filed at 11/06/2022 1907 Gross per 24 hour  Intake 360 ml  Output 400 ml  Net -40 ml      11/06/2022    5:51 AM 11/04/2022    8:48 PM 10/10/2022    3:49 PM  Last 3 Weights  Weight (lbs) 154 lb 15.7 oz 165 lb 165 lb 5.5 oz  Weight (kg) 70.3 kg 74.844 kg 75 kg      Telemetry    NSR, HR in the 60s-70s - Personally Reviewed  ECG    No new tracings since 3/13 - Personally Reviewed  Physical Exam   GEN: No acute distress. Sitting upright in the bed   Neck: No JVD.  Cardiac: RRR, Respiratory: Clear to auscultation bilaterally. Normal WOB on room air  GI: Soft, nontender, non-distended  MS: No edema in BLE; No deformity. Neuro:  Nonfocal   Psych: Normal affect   Labs    High Sensitivity Troponin:   Recent Labs  Lab 11/02/22 1700 11/02/22 2026 11/04/22 2129 11/05/22 0429 11/05/22 2246  TROPONINIHS 68* 60* 49* 54* 39*     Chemistry Recent Labs  Lab 11/02/22 1700 11/04/22 2129 11/05/22 1100 11/06/22 0744 11/07/22 0446  NA 143 139  --  141 140  K 4.4 4.0  --  4.0 4.2  CL 107 104  --  105 103  CO2 21* 24  --  26 25  GLUCOSE 109* 113*  --  119* 156*  BUN 39* 38*  --  45* 53*  CREATININE 4.61* 4.61* 4.71* 4.80* 4.71*  CALCIUM 8.9 8.8*  --  8.7* 9.0  MG 1.9  --   --   --   --   PROT 6.4*  --   --   --   --   ALBUMIN 3.2*  --   --   --   --   AST 19  --   --   --   --   ALT 11  --   --   --   --  ALKPHOS 51  --   --   --   --   BILITOT 0.3  --   --   --   --   GFRNONAA 10* 10* 9* 9* 9*  ANIONGAP 15 11  --  10 12    Lipids No results for input(s): "CHOL", "TRIG", "HDL", "LABVLDL", "LDLCALC", "CHOLHDL" in the last 168 hours.  Hematology Recent Labs  Lab 11/05/22 1100 11/06/22 0744 11/07/22 0446  WBC 4.7 4.3 5.4  RBC 3.06* 3.30* 3.47*  3.38*  HGB 8.6* 9.2* 9.8*  HCT 27.3* 29.4* 30.9*  MCV 89.2 89.1 89.0  MCH 28.1 27.9 28.2  MCHC 31.5 31.3 31.7  RDW 13.9 13.7 13.6  PLT 202 222 254   Thyroid No results for input(s): "TSH", "FREET4" in the last 168 hours.  BNPNo results for input(s): "BNP", "PROBNP" in the last 168 hours.  DDimer No results for input(s): "DDIMER" in the last 168 hours.   Radiology    VAS US RENAL ARTERY DUPLEX  Result Date: 11/06/2022 ABDOMINAL VISCERAL Patient Name:  Teresa Poole  Date of Exam:   11/06/2022 Medical Rec #: [id number]           Accession #:    [id number] Date of Birth: [birth year]           Patient Gender: F Patient Age:   27 years Exam Location:  [facility] Procedure:      VAS US RENAL ARTERY DUPLEX Referring Phys: [id number] Bonnielee Haff -------------------------------------------------------------------------------- Indications: Hypertension High Risk  Factors: Hypertension, hyperlipidemia, Diabetes. Vascular Interventions: Left renal artery stent. Limitations: Air/bowel gas. Comparison Study: No prior studies. Performing Technologist: Oliver Hum RVT  Examination Guidelines: A complete evaluation includes B-mode imaging, spectral Doppler, color Doppler, and power Doppler as needed of all accessible portions of each vessel. Bilateral testing is considered an integral part of a complete examination. Limited examinations for reoccurring indications may be performed as noted.  Duplex Findings: +--------------------+--------+--------+------+------------------+ Mesenteric          PSV cm/sEDV cm/sPlaque     Comments      +--------------------+--------+--------+------+------------------+ Aorta Mid              58      8                             +--------------------+--------+--------+------+------------------+ Celiac Artery Origin                      Unable to insonate +--------------------+--------+--------+------+------------------+ SMA Origin                                Unable to insonate +--------------------+--------+--------+------+------------------+    +------------------+--------+--------+-------+ Right Renal ArteryPSV cm/sEDV cm/sComment +------------------+--------+--------+-------+ Origin              108      14           +------------------+--------+--------+-------+ Proximal             74      13           +------------------+--------+--------+-------+ Mid                 110      13           +------------------+--------+--------+-------+ Distal               81  10           +------------------+--------+--------+-------+ +-----------------+--------+--------+-------+ Left Renal ArteryPSV cm/sEDV cm/sComment +-----------------+--------+--------+-------+ Origin             112      15           +-----------------+--------+--------+-------+ Proximal           190      32            +-----------------+--------+--------+-------+ Mid                 38      7            +-----------------+--------+--------+-------+ Distal              35      6            +-----------------+--------+--------+-------+ +------------+--------+--------+----+-----------+--------+--------+----+ Right KidneyPSV cm/sEDV cm/sRI  Left KidneyPSV cm/sEDV cm/sRI   +------------+--------+--------+----+-----------+--------+--------+----+ Upper Pole  30      5       0.82Upper Pole 33      6       0.82 +------------+--------+--------+----+-----------+--------+--------+----+ Mid         26      5       0.80Mid        37      8       0.79 +------------+--------+--------+----+-----------+--------+--------+----+ Lower Pole  20      5       0.77Lower Pole 24      10      0.59 +------------+--------+--------+----+-----------+--------+--------+----+ Hilar       96      10      0.90Hilar      31      7       0.77 +------------+--------+--------+----+-----------+--------+--------+----+ +------------------+----+------------------+----+ Right Kidney          Left Kidney            +------------------+----+------------------+----+ RAR                   RAR                    +------------------+----+------------------+----+ RAR (manual)      1.9 RAR (manual)      3.3  +------------------+----+------------------+----+ Cortex                Cortex                 +------------------+----+------------------+----+ Cortex thickness      Corex thickness        +------------------+----+------------------+----+ Kidney length (cm)9.50Kidney length (cm)8.50 +------------------+----+------------------+----+  Summary: Renal:  Right: Normal size right kidney. Abnormal right Resistive Index. No        evidence of right renal artery stenosis. Left:  Normal size of left kidney. Abnormal left Resisitve Index.        1-59% stenosis of the left renal artery.  *See table(s)  above for measurements and observations.  Diagnosing physician: Servando Snare MD  Electronically signed by Servando Snare MD on 11/06/2022 at 4:53:43 PM.    Final    ECHOCARDIOGRAM COMPLETE  Result Date: 11/06/2022    ECHOCARDIOGRAM REPORT   Patient Name:   Teresa Poole Date of Exam: 11/06/2022 Medical Rec #:  [id number]          Height:       66.0 in Accession #:    [id number]  Weight:       155.0 lb Date of Birth:  October 01, 1950          BSA:          1.794 m Patient Age:    56 years           BP:           147/63 mmHg Patient Gender: F                  HR:           69 bpm. Exam Location:  Inpatient Procedure: 2D Echo, Color Doppler and Cardiac Doppler Indications:    R07.9* Chest pain, unspecified  History:        Patient has prior history of Echocardiogram examinations. CHF,                 CAD, Arrythmias:RBBB; Risk Factors:Dyslipidemia, Diabetes and                 Hypertension.  Sonographer:    Phineas Douglas Referring Phys: [id number] [place]  1. Left ventricular ejection fraction, by estimation, is 45 to 50%. The left ventricle has mildly decreased function. The left ventricle demonstrates global hypokinesis. There is mild concentric left ventricular hypertrophy. Left ventricular diastolic parameters are consistent with Grade II diastolic dysfunction (pseudonormalization).  2. Right ventricular systolic function is normal. The right ventricular size is normal.  3. Left atrial size was moderately dilated.  4. The mitral valve is grossly normal. Trivial mitral valve regurgitation. No evidence of mitral stenosis.  5. The aortic valve is tricuspid. There is mild calcification of the aortic valve. There is mild thickening of the aortic valve. Aortic valve regurgitation is trivial. Aortic valve sclerosis/calcification is present, without any evidence of aortic stenosis.  6. The inferior vena cava is normal in size with greater than 50% respiratory variability, suggesting right atrial  pressure of 3 mmHg. Comparison(s): Prior images reviewed side by side. Changes from prior study are noted. Conclusion(s)/Recommendation(s): Mildly reduced LVEF compared to prior, in a pattern of global hypokinesis. No focal wall motion abnormalities noted. FINDINGS  Left Ventricle: Left ventricular ejection fraction, by estimation, is 45 to 50%. The left ventricle has mildly decreased function. The left ventricle demonstrates global hypokinesis. The left ventricular internal cavity size was normal in size. There is  mild concentric left ventricular hypertrophy. Left ventricular diastolic parameters are consistent with Grade II diastolic dysfunction (pseudonormalization). Right Ventricle: The right ventricular size is normal. No increase in right ventricular wall thickness. Right ventricular systolic function is normal. Left Atrium: Left atrial size was moderately dilated. Right Atrium: Right atrial size was normal in size. Pericardium: Trivial pericardial effusion is present. Mitral Valve: The mitral valve is grossly normal. Trivial mitral valve regurgitation. No evidence of mitral valve stenosis. Tricuspid Valve: The tricuspid valve is grossly normal. Tricuspid valve regurgitation is trivial. No evidence of tricuspid stenosis. Aortic Valve: The aortic valve is tricuspid. There is mild calcification of the aortic valve. There is mild thickening of the aortic valve. Aortic valve regurgitation is trivial. Aortic regurgitation PHT measures 395 msec. Aortic valve sclerosis/calcification is present, without any evidence of aortic stenosis. Pulmonic Valve: The pulmonic valve was not well visualized. Pulmonic valve regurgitation is not visualized. No evidence of pulmonic stenosis. Aorta: The aortic root, ascending aorta, aortic arch and descending aorta are all structurally normal, with no evidence of dilitation or obstruction. Venous: The inferior vena cava is normal in size with  greater than 50% respiratory variability,  suggesting right atrial pressure of 3 mmHg. IAS/Shunts: The atrial septum is grossly normal.  LEFT VENTRICLE PLAX 2D LVIDd:         5.00 cm      Diastology LVIDs:         3.90 cm      LV e' medial:    4.68 cm/s LV PW:         1.20 cm      LV E/e' medial:  24.4 LV IVS:        1.20 cm      LV e' lateral:   5.22 cm/s LVOT diam:     1.90 cm      LV E/e' lateral: 21.8 LV SV:         62 LV SV Index:   34 LVOT Area:     2.84 cm  LV Volumes (MOD) LV vol d, MOD A2C: 121.0 ml LV vol d, MOD A4C: 136.0 ml LV vol s, MOD A2C: 61.2 ml LV vol s, MOD A4C: 71.0 ml LV SV MOD A2C:     59.8 ml LV SV MOD A4C:     136.0 ml LV SV MOD BP:      65.0 ml RIGHT VENTRICLE             IVC RV Basal diam:  3.70 cm     IVC diam: 1.90 cm RV S prime:     12.10 cm/s TAPSE (M-mode): 2.0 cm LEFT ATRIUM             Index        RIGHT ATRIUM           Index LA diam:        3.70 cm 2.06 cm/m   RA Area:     14.40 cm LA Vol (A2C):   83.9 ml 46.76 ml/m  RA Volume:   34.40 ml  19.17 ml/m LA Vol (A4C):   67.0 ml 37.34 ml/m LA Biplane Vol: 75.3 ml 41.96 ml/m  AORTIC VALVE LVOT Vmax:   91.40 cm/s LVOT Vmean:  64.500 cm/s LVOT VTI:    0.217 m AI PHT:      395 msec  AORTA Ao Root diam: 2.80 cm Ao Asc diam:  3.10 cm MITRAL VALVE MV Area (PHT): 2.84 cm     SHUNTS MV Decel Time: 267 msec     Systemic VTI:  0.22 m MV E velocity: 114.00 cm/s  Systemic Diam: 1.90 cm MV A velocity: 124.00 cm/s MV E/A ratio:  0.92 Buford Dresser MD Electronically signed by Buford Dresser MD Signature Date/Time: 11/06/2022/2:58:31 PM    Final    US RENAL  Result Date: 11/06/2022 CLINICAL DATA:  Acute kidney injury EXAM: RENAL / URINARY TRACT ULTRASOUND COMPLETE COMPARISON:  None Available. FINDINGS: Right Kidney: Renal measurements: 6.6 x 4.2 x 3.2 cm = volume: 46.4 mL. No hydronephrosis. Increased renal cortical echogenicity. Small simple cysts measuring up to 1.7 and 1.8 cm. Left Kidney: Renal measurements: 6.6 x 3.9 x 3.4 cm = volume: 46.1 mL. No hydronephrosis.  Increased renal cortical echogenicity. Bladder: Partially obscured by bowel gas.  Appears mildly distended. Other: None. IMPRESSION: No hydronephrosis. Increased renal cortical echogenicity bilaterally, as can be seen in medical renal disease. Electronically Signed   By: Maurine Simmering M.D.   On: 11/06/2022 12:37   VAS US CAROTID  Result Date: 11/05/2022 Carotid Arterial Duplex Study Patient Name:  Teresa Poole  Date of Exam:  11/05/2022 Medical Rec #: [id number]           Accession #:    [id number] Date of Birth: 29-Mar-1951           Patient Gender: F Patient Age:   40 years Exam Location:  [facility] Procedure:      VAS US CAROTID Referring Phys: MIR [facility] --------------------------------------------------------------------------------  Indications:       History of left ICA stenosis, left neck and ear pain. Risk Factors:      Hypertension, hyperlipidemia, Diabetes, no history of                    smoking, mild coronary artery disease. Other Factors:     Renal artery stenosis. Comparison Study:  07-01-2022 MRA Neck indicating approximately 50% left ICA                    stenosis and severe left vertebral artery stenosis. Performing Technologist: Darlin Coco RDMS, RVT  Examination Guidelines: A complete evaluation includes B-mode imaging, spectral Doppler, color Doppler, and power Doppler as needed of all accessible portions of each vessel. Bilateral testing is considered an integral part of a complete examination. Limited examinations for reoccurring indications may be performed as noted.  Right Carotid Findings: +----------+--------+--------+--------+-------------------+--------------------+           PSV cm/sEDV cm/sStenosisPlaque Description Comments             +----------+--------+--------+--------+-------------------+--------------------+ CCA Prox  76      12                                                       +----------+--------+--------+--------+-------------------+--------------------+ CCA Distal81      11                                 intimal thickening   +----------+--------+--------+--------+-------------------+--------------------+ ICA Prox  166     37      1-39%   calcific and       Upper range-                                           heterogenous       borderline 40-59%    +----------+--------+--------+--------+-------------------+--------------------+ ICA Mid   104     26                                                      +----------+--------+--------+--------+-------------------+--------------------+ ICA Distal103     29                                                      +----------+--------+--------+--------+-------------------+--------------------+ +----------+--------+-------+---------+-------------------+           PSV cm/sEDV cmsDescribe Arm Pressure (mmHG) +----------+--------+-------+---------+-------------------+ Subclavian70             Turbulent                    +----------+--------+-------+---------+-------------------+ +---------+--------+---+--------+--+---------+  VertebralPSV cm/s117EDV cm/s23Antegrade +---------+--------+---+--------+--+---------+  Left Carotid Findings: +----------+--------+--------+--------+----------------------------+-----------+           PSV cm/sEDV cm/sStenosisPlaque Description          Comments    +----------+--------+--------+--------+----------------------------+-----------+ CCA Prox  92      14                                                      +----------+--------+--------+--------+----------------------------+-----------+ CCA Distal103     20      <50%    heterogenous                            +----------+--------+--------+--------+----------------------------+-----------+ ICA Prox  213     51      40-59%  calcific, heterogenous and  Upper range                                    irregular                               +----------+--------+--------+--------+----------------------------+-----------+ ICA Mid   145     29                                                      +----------+--------+--------+--------+----------------------------+-----------+ ICA Distal150     32                                                      +----------+--------+--------+--------+----------------------------+-----------+ ECA       64                                                              +----------+--------+--------+--------+----------------------------+-----------+ +----------+--------+--------+---------+-------------------+           PSV cm/sEDV cm/sDescribe Arm Pressure (mmHG) +----------+--------+--------+---------+-------------------+ [id number]             Turbulent                    +----------+--------+--------+---------+-------------------+ +---------+--------+--+--------+--+---------+ VertebralPSV cm/s47EDV cm/s11Antegrade +---------+--------+--+--------+--+---------+   Summary: Right Carotid: Velocities in the right ICA are consistent with a 1-39% stenosis. Left Carotid: Velocities in the left ICA are consistent with a 40-59% stenosis.               Non-hemodynamically significant plaque <50% noted in the CCA. Vertebrals:  Bilateral vertebral arteries demonstrate antegrade flow. Subclavians: Bilateral subclavian artery flow was disturbed. *See table(s) above for measurements and observations.  Electronically signed by Antony Contras MD on 11/05/2022 at 12:13:57 PM.    Final     Cardiac Studies   Echocardiogram 3/15 1. Left ventricular ejection fraction, by estimation, is 45 to 50%. The  left  ventricle has mildly decreased function. The left ventricle  demonstrates global hypokinesis. There is mild concentric left ventricular  hypertrophy. Left ventricular diastolic  parameters are consistent with Grade II diastolic dysfunction   (pseudonormalization).   2. Right ventricular systolic function is normal. The right ventricular  size is normal.   3. Left atrial size was moderately dilated.   4. The mitral valve is grossly normal. Trivial mitral valve  regurgitation. No evidence of mitral stenosis.   5. The aortic valve is tricuspid. There is mild calcification of the  aortic valve. There is mild thickening of the aortic valve. Aortic valve  regurgitation is trivial. Aortic valve sclerosis/calcification is present,  without any evidence of aortic  stenosis.   6. The inferior vena cava is normal in size with greater than 50%  respiratory variability, suggesting right atrial pressure of 3 mmHg.   Carotid Duplex 11/05/22 Summary:  Right Carotid: Velocities in the right ICA are consistent with a 1-39%  stenosis.   Left Carotid: Velocities in the left ICA are consistent with a 40-59%  stenosis.               Non-hemodynamically significant plaque <50% noted in the  CCA.   Vertebrals: Bilateral vertebral arteries demonstrate antegrade flow.  Subclavians: Bilateral subclavian artery flow was disturbed.   Patient Profile     72 y.o. female with a hx of NICM, chronic combined CHF, mild nonobstructive CAD (cath 2015), anemia, CKD stage IV, HTN, HLD, GERD, carotid stenosis, L renal artery stenosis s/p stenting 2020, thyroid disease who is being seen for the evaluation of elevated troponin in the setting of HTN with medication compliance issues in the setting of signs of dementia  Assessment & Plan    Elevated troponins Chest Pain  --> in the setting of HTN emergency - High-sensitivity troponin 49>54>39.  Of note, on 3/11, hsTn was 68>60 - BP was elevated to 214/84 on arrival to the ED. Creatinine 4.61 - Patient did complain of chest pain in the ED, but it was very atypical.  Initially described as a stabbing/pounding sensation. Chest pain has since resolved and patient is currently chest pain free  - Suspect that  chest pain is secondary to severely elevated blood pressure.  Trop elevation is likely type II MI in the setting of elevated BP, worsening kidney function - No need for IV heparin.   - no specific wall motion abn. on echo   Hypertensive emergency --> Bps much better controlled; continue current regimen - BP initially elevated to 214/84 in the ED.  She was given a one time dose of clonidine yesterday, and her home medications were resumed. Also started amlodipine 10 mg daily 3/14 - Continue amlodipine 10 mg daily. Holding lisinopril given poor kidney function. Resume home carvedilol at 6.25 mg BID (was taking 25 mg BID at home, dose reduced for low HR)  - Note, patient does have memory issues that have been worsening over the past year. Sister at bedside did tell Dr. Harriet Masson yesterday that the patient may not be taking her medications as prescribed. Sister willing to help make sure patient is taking her medications after she is discharged     History of nonischemic cardiomyopathy - echo 3/15: EF 45-50%, grade II DD, no signs of PHTN - Echocardiogram in 2015 showed EF 30-35%.  Patient underwent heart catheterization in 2015 that showed nonobstructive CAD with serial 20% proximal and mid LAD stenosis, 30% proximal left circumflex stenosis, 30% mid AV groove circumflex  stenosis. - Most recent echocardiogram from 03/2022 showed EF 55-60%, no regional wall motion abnormalities - Patient euvolemic on exam. CXR without pulmonary edema or pleural effusions  - Continue carvedilol. Lisinopril held for poor renal function    CKD stage IV - Creatinine elevated to 4.61 on admission and is up to 4.8 today. Appears that baseline is around 3.3-3.7  - Reports seeing Dr. Joelyn Oms with nephrology, may benefit from nephrology consult if creatinine continues to worsen    History of nonobstructive CAD - Noted on heart catheterization in 2015 -Continue carvedilol, aspirin, Crestor  She is refusing tele, that is ok.  Cardiology will follow peripherally. She has signs of dementia and care will be challenging moving forward.  For questions or updates, please contact [place] Please consult [email or web address] for contact info under        Signed, Janina Mayo, MD  11/07/2022, 8:39 AM

## 2022-11-07 NOTE — Progress Notes (Signed)
[facility] Progress Note  Name: Teresa Poole MRN: [id number] DOB: [birth year]  Chief Complaint:  Chest pain (on admission)  Subjective:  She had 400 mL UOP as well as 2 unmeasured urine voids.  She had a sitter overnight as she had stated twice to me that she would commit suicide.  Psychiatry has seen her today and per their note she indicated that she makes statements like this when she is frustrated.    Review of systems:  Some limitations of patient sleepy and has gotten meds for same  Denies current n/v Denies shortness of breath or chest pain  No neck or jaw pain ---------------- Background on consult:  Teresa Poole is a 72 y.o. female with a history of CKD stage V, hypertension, type 2 diabetes mellitus, chronic combined systolic and diastolic CHF, coronary artery disease, obesity, and renal artery stenosis who presented to the hospital with chest pain with left neck pain.  She was found to have uncontrolled HTN and has been treated as hypertensive urgency/emergency.  Cardiology was consulted and she was admitted to medicine.  They felt not consistent with ACS.  She was found to have acute kidney injury on top of her chronic kidney disease.  Strict ins/outs are not available.  She had two unmeasured urine voids over 3/14.  Nephrology was consulted for assistance with management.  Her creatinine trends are included below.  She has had a couple of AKI events over the past year with peak creatinine in the low to mid 4's during those times.  Home meds include lasix and lisinopril per charting here.  I do not see lisinopril or lasix on current outpatient med list through our office.  She last got lisinopril here on 3/14.  Reported compliance with home meds.  Per charting she lives alone and does have some trouble with her memory.  She had a renal artery duplex today with no evidence of right renal artery stenosis and 1-59% stenosis of the left renal artery.  Note she  previously underwent left renal artery stent placement in 09/2018 with Dr. Carlis Abbott.  She follows with Dr. Joelyn Oms in our office and last saw him on 09/29/22.  She expressed during that visit that she would never want dialysis and has declined dialysis access; she cited watching multiple people in her life who have been on dialysis.  She was quoted as stating that she is "dying anyways, what's the point?".   Directly from the note from Dr. Joelyn Oms "She is clear and repeatedly tells me that she would never receive dialysis, she would die first."  During our conversation today she states that she has never discussed this with Dr. Joelyn Oms.  Patient denies any NSAID use.  I called her sister, Jacqlyn Larsen, on the phone during our conversation as her memory impairment had been noted.  During our conversation, the patient stated "so I have no kidney function, I have no kidney function" and [name] and I both reiterated that that is not true.  She repeated that back to both of [place] and [place] encouraged her.  After she hung up the phone, she stated "I have no kidney function.  I know what I'll do - I'll commit suicide.  I'll commit suicide."  I notified her nurse who also came in and assessed her and I paged the on call coverage for the primary team.  They are arranging for a sitter.    Intake/Output Summary (Last 24 hours) at 11/07/2022 [id number] Last data filed  at 11/07/2022 1300 Gross per 24 hour  Intake 360 ml  Output 200 ml  Net 160 ml    Vitals:  Vitals:   11/07/22 0953 11/07/22 1052 11/07/22 1059 11/07/22 1329  BP: (!) 193/82 (!) 182/75  (!) 168/81  Pulse: 85 68  67  Resp: 16 16 17 17   Temp: 98 F (36.7 C) 97.9 F (36.6 C)    TempSrc: Oral Oral    SpO2: 100% 100%    Weight:      Height:         Physical Exam:  General: elderly female in bed in NAD   HEENT: NCAT Eyes: EOMI sclera anicteric Neck: supple; trachea midline  Heart: S1S2 no rub Lungs: clear and unlabored; on room air  Abdomen:  soft/nt/nd Extremities: no edema  Neuro: awake but sleepy; oriented to person, year, and location of [place] - not able to tell me specifically where we are in [place] GU: no foley     Medications reviewed   Labs:     Latest Ref Rng & Units 11/07/2022    4:46 AM 11/06/2022    7:44 AM 11/05/2022   11:00 AM  BMP  Glucose 70 - 99 mg/dL 156  119    BUN 8 - 23 mg/dL 53  45    Creatinine 0.44 - 1.00 mg/dL 4.71  4.80  4.71   Sodium 135 - 145 mmol/L 140  141    Potassium 3.5 - 5.1 mmol/L 4.2  4.0    Chloride 98 - 111 mmol/L 103  105    CO2 22 - 32 mmol/L 25  26    Calcium 8.9 - 10.3 mg/dL 9.0  8.7       Assessment/Plan:   # AKI  - Secondary to pre-renal and ischemic insults with hypertensive emergency in the setting of RAAS blockade.  May be exacerbated by rapid lowering of blood pressure (however pt with chest pain which likely prompted same)   - Check strict ins/outs  - Agree with holding lisinopril (not on our outpatient EMR for her currently, neither is lasix but patient is poor historian and comes to appointments alone per charting).  Would not resume on discharge  - She has expressed never wanting dialysis per outpatient charting.  She would also be a poor candidate given untreated psychiatric illness and her memory impairment - both of which appear chronic - I called her sister x 2 to attempt to update her.  I missed her and left a voicemail that I would try her back tomorrow.  Patient has poor insight     # CKD stage V  - Secondary to DM and HTN - Baseline Cr variable as above.   - She [facility] with [facility].  Renal ultrasound with 6.6 cm kidneys bilaterally which are echogenic c/w CKD.  Decreased renal reserve/scarring  - She has expressed never wanting dialysis however doesn't recall these conversations.  Would advocate for family member to be present or called for follow-up appointments.  Her sister, Jacqlyn Larsen is available for same.    # Hypertensive emergency  -  Symptoms improved - BP improved  - agree with holding lisinopril - Continue amlodipine and coreg.  HR limits titration of coreg.  Hx of itching with hydralazine  - Start isosorbide mononitrate 30 mg daily    # Chest pain  - Attributed to HTN emergency per cardiology; not felt to represent acute coronary syndrome    # Renal artery stenosis  -  s/p left renal artery stent in 09/2018 - Mild current left renal artery stenosis and would be less likely contributing at this point to acute picture    # Normocytic anemia  - iron deficiency  - Start nu-iron daily    # Suicidal ideation - She made the statement to me on 3/15 "I'm going to commit suicide" and then repeated it - Psychiatry was consulted - Per primary team     # Memory impairment  - Per primary team  - note    Disposition - would continue inpatient monitoring   Claudia Desanctis, MD 11/07/2022 4:25 PM

## 2022-11-08 DIAGNOSIS — F05 Delirium due to known physiological condition: Secondary | ICD-10-CM

## 2022-11-08 DIAGNOSIS — N179 Acute kidney failure, unspecified: Secondary | ICD-10-CM | POA: Diagnosis not present

## 2022-11-08 DIAGNOSIS — I10 Essential (primary) hypertension: Secondary | ICD-10-CM | POA: Diagnosis not present

## 2022-11-08 LAB — BASIC METABOLIC PANEL
Anion gap: 10 (ref 5–15)
BUN: 46 mg/dL — ABNORMAL HIGH (ref 8–23)
CO2: 25 mmol/L (ref 22–32)
Calcium: 8.7 mg/dL — ABNORMAL LOW (ref 8.9–10.3)
Chloride: 109 mmol/L (ref 98–111)
Creatinine, Ser: 4.36 mg/dL — ABNORMAL HIGH (ref 0.44–1.00)
GFR, Estimated: 10 mL/min — ABNORMAL LOW (ref >60)
Glucose, Bld: 87 mg/dL (ref 70–99)
Potassium: 4 mmol/L (ref 3.5–5.1)
Sodium: 144 mmol/L (ref 135–145)

## 2022-11-08 LAB — CBC
HCT: 26.8 % — ABNORMAL LOW (ref 36.0–46.0)
Hemoglobin: 8.4 g/dL — ABNORMAL LOW (ref 12.0–15.0)
MCH: 28.6 pg (ref 26.0–34.0)
MCHC: 31.3 g/dL (ref 30.0–36.0)
MCV: 91.2 fL (ref 80.0–100.0)
Platelets: 214 10*3/uL (ref 150–400)
RBC: 2.94 MIL/uL — ABNORMAL LOW (ref 3.87–5.11)
RDW: 14.1 % (ref 11.5–15.5)
WBC: 4.2 10*3/uL (ref 4.0–10.5)
nRBC: 0 % (ref 0.0–0.2)

## 2022-11-08 LAB — GLUCOSE, CAPILLARY
Glucose-Capillary: 90 mg/dL (ref 70–99)
Glucose-Capillary: 184 mg/dL — ABNORMAL HIGH (ref 70–99)
Glucose-Capillary: 130 mg/dL — ABNORMAL HIGH (ref 70–99)
Glucose-Capillary: 212 mg/dL — ABNORMAL HIGH (ref 70–99)

## 2022-11-08 MED ORDER — DARBEPOETIN ALFA 40 MCG/0.4ML IJ SOSY
40.0000 ug | PREFILLED_SYRINGE | Freq: Once | INTRAMUSCULAR | Status: AC
  Administered 2022-11-08: 40 ug via SUBCUTANEOUS
  Filled 2022-11-08: qty 0.4

## 2022-11-08 MED ORDER — QUETIAPINE FUMARATE 50 MG PO TABS
50.0000 mg | ORAL_TABLET | Freq: Every day | ORAL | Status: DC
  Administered 2022-11-08 – 2022-11-12 (×5): 50 mg via ORAL
  Filled 2022-11-08 (×5): qty 1

## 2022-11-08 MED ORDER — HALOPERIDOL LACTATE 5 MG/ML IJ SOLN
0.5000 mg | Freq: Four times a day (QID) | INTRAMUSCULAR | Status: AC | PRN
  Administered 2022-11-10 (×3): 0.5 mg via INTRAVENOUS
  Filled 2022-11-08 (×4): qty 1

## 2022-11-08 MED ORDER — ORAL CARE MOUTH RINSE: 15.0000 mL | OROMUCOSAL | Status: DC | PRN

## 2022-11-08 NOTE — Progress Notes (Signed)
Report received from Howell Rucks, RN.  Assessment unchanged. Teresa Poole

## 2022-11-08 NOTE — TOC Initial Note (Signed)
Transition of Care [facility]) - Initial/Assessment Note    Patient Details  Name: Teresa Poole MRN: [id number] Date of Birth: October 10, 1950  [facility]) CM/SW Contact:    Henrietta Dine, RN Phone Number: 11/08/2022, 12:32 PM  Clinical Narrative:                 TOC for d/c planning; pt has memory impairment; called  her sister/POC  Shellia Carwin [phone]); she says pt is from home and plans for her to return at d/c; she says pt has transportation; she denies pt experiencing IPV, food insecurity, and problems paying utilities; she says pt wears glasses; pt does not have HA or dentures; she says pt does not have DME, [place] services, or home oxygen; pt pending Psych eval due to SI; TOC will follow.  Expected Discharge Plan: Home/Self Care Barriers to Discharge: Continued Medical Work up   Patient Goals and CMS Choice Patient states their goals for this hospitalization and ongoing recovery are:: home (per pt;s sister Lovis Bonis)          Expected Discharge Plan and Services   Discharge Planning Services: CM Consult   Living arrangements for the past 2 months: Single Family Home                                      Prior Living Arrangements/Services Living arrangements for the past 2 months: Single Family Home Lives with:: Self Patient language and need for interpreter reviewed:: Yes Do you feel safe going back to the place where you live?: Yes      Need for Family Participation in Patient Care: Yes (Comment) Care giver support system in place?: Yes (comment) Current home services:  (n/a) Criminal Activity/Legal Involvement Pertinent to Current Situation/Hospitalization: No - Comment as needed  Activities of Daily Living Home Assistive Devices/Equipment: Blood pressure cuff, CBG Meter ADL Screening (condition at time of admission) Patient's cognitive ability adequate to safely complete daily activities?: No Is the patient deaf or have  difficulty hearing?: No Does the patient have difficulty seeing, even when wearing glasses/contacts?: No Does the patient have difficulty concentrating, remembering, or making decisions?: No Patient able to express need for assistance with ADLs?: Yes Does the patient have difficulty dressing or bathing?: No Independently performs ADLs?: Yes (appropriate for developmental age) Does the patient have difficulty walking or climbing stairs?: No Weakness of Legs: None Weakness of Arms/Hands: None  Permission Sought/Granted Permission sought to share information with : Case Manager Permission granted to share information with : Yes, Verbal Permission Granted  Share Information with NAME: Lenor Coffin, RN, CM     Permission granted to share info w Relationship: Kandace Blitz (sister) [phone]     Emotional Assessment Appearance:: Other (Comment Required (unable to assess) Attitude/Demeanor/Rapport: Unable to Assess Affect (typically observed): Unable to Assess Orientation: : Oriented to Self Alcohol / Substance Use: Not Applicable Psych Involvement: Yes (comment)  Admission diagnosis:  Renal insufficiency [N28.9] Chest pain at rest [R07.9] Uncontrolled hypertension [I10] Elevated troponin I level [R79.89] Acute kidney injury [facility]) [N17.9] Patient Active Problem List   Diagnosis Date Noted   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 ([place]) 11/07/2022   Acute kidney injury ([place]) 11/05/2022   Dizziness 04/17/2022   (HFpEF) heart failure with preserved ejection fraction ([place]) 02/28/2022   Hypomagnesemia 02/28/2022   Manic episode, unspecified ([place])  COVID-19 virus infection 10/25/2021   UTI (urinary tract infection) 10/25/2021   Anemia 10/25/2021   Suicidal ideations 10/17/2021   Acute renal failure superimposed on stage 4 chronic kidney disease ([place]) 10/14/2021   ARF (acute renal failure) ([place]) 10/14/2021   Small bowel  obstruction (H[facility])    SBO (small bowel obstruction) ([place]) 09/22/2021   Adjustment disorder with anxiety 06/10/2021   Atypical chest pain 11/27/2018   Renal artery stenosis ([place]) 10/11/2018   Hyperlipidemia associated with type 2 diabetes mellitus ([place]) 10/26/2013   Chronic combined systolic and diastolic heart failure ([place]) 09/28/2013   Anemia of chronic kidney failure, stage 4 (severe) ([place]) 09/24/2013   Hypertensive urgency 09/18/2013   Cardiomyopathy, hypertensive ([place]) 09/18/2013   Edema 09/14/2013   Acute combined systolic and diastolic congestive heart failure ([place]) 09/09/2013   Hypertensive heart disease with CHF (congestive heart failure) ([place]) 09/09/2013   Hypertension associated with diabetes ([place]) 09/07/2013   Type 2 diabetes mellitus with chronic kidney disease, without long-term current use of insulin ([place]) 09/07/2013   Obesity (BMI 30-39.9)    History of ovarian cancer    PCP:  [name], MD Pharmacy:   [facility] [phone] - [address], [address] AT [facility] [address] & [address] [place] [address] Phone: [phone] Fax: [phone]     Social Determinants of Health ([place]) Social History: SDOH Screenings   Food Insecurity: No Food Insecurity (11/08/2022)  Housing: Low Risk  (11/08/2022)  Transportation Needs: No Transportation Needs (11/08/2022)  Utilities: Not At Risk (11/08/2022)  Tobacco Use: Low Risk  (11/05/2022)   SDOH Interventions: Food Insecurity Interventions: Inpatient TOC Housing Interventions: Inpatient TOC Transportation Interventions: Inpatient TOC Utilities Interventions: Inpatient TOC   Readmission Risk Interventions    10/28/2021    2:56 PM  Readmission Risk Prevention Plan  Transportation Screening Complete  Medication Review ([place]) Complete  PCP or Specialist appointment within 3-5 days of discharge Complete  HRI or [place] Complete  SW Recovery Care/Counseling Consult  Complete  [place] Not Applicable

## 2022-11-08 NOTE — Progress Notes (Addendum)
[facility] PROGRESS NOTE   Teresa Poole [id number] DOB: 01-17-1951 DOA: 11/04/2022  PCP: [name], MD  Brief History/Interval Summary: 72 y.o. female with medical history significant for obesity, combined chronic systolic and diastolic congestive heart failure, CKD stage IV baseline creatinine about 3, history of renal artery stenosis status post stent about 5 years ago, admitted to the hospital with uncontrolled hypertension and atypical chest pain.  Noted to have systolic blood pressures greater than 200.  There is some concern about noncompliance although patient is certain she takes all of her medications.  Apparently there have been some memory issues recently.  Patient was hospitalized for further management.    Consultants: Cardiology.  Nephrology  Procedures: Echocardiogram    Subjective/Interval History: Patient noted to be much more calmer today compared to yesterday.  Slept well overnight.  Has been taken off of restraints.  She denies any complaints.   Assessment/Plan:  Hypertensive emergency Likely reason for her chest discomfort.  Systolic blood pressure was greater than 200.  Patient was given her antihypertensives with improvement in blood pressure.  Cardiology was consulted. She is currently on amlodipine, carvedilol and nitrates.  ACE inhibitor is currently on hold due to worsening creatinine. She was given a dose of clonidine in the emergency department. Blood pressures have improved.  Plan is to continue current treatment for now. Renal ultrasound was done including renal vascular studies which showed some degree of left renal artery stenosis but not significant enough to cause her elevated blood pressures. Carotid Dopplers did show moderate disease.  But she is asymptomatic.  Outpatient management.  Acute agitation/delirium/suicidal ideation/history of memory impairment. Patient has underlying cognitive impairment as evidenced by history  of memory impairment mentioned by her sister.   She was quite agitated overnight on 3/15.  She was given Ativan without any effect.  She was restrained.  She was given haloperidol and started on Seroquel.  Due to suicidal ideation psychiatry was consulted.  Continue with sitter. EKG reviewed this morning.  Even though the computer is calling it prolonged QT upon my calculation QT interval corrected for heart rate is 458 ms. Will increase Seroquel to 50 mg.  Stop her trazodone. CT head did not show any acute findings.  TSH was normal when checked in February. Vitamin B12 level noted to be borderline low at 231.  See below May need further workup in the outpatient setting with referral to neurology.  Chest pain Atypical.  Likely secondary to elevated blood pressure.  Troponin trends have been flat. Echocardiogram shows diminished left ventricular EF of 45 to 50%.  No further episodes of chest pain noted.  Cardiology does not plan any further workup at this time.  Acute on chronic kidney disease stage IV Baseline creatinine is around 3.7.  Presented with a higher than baseline creatinine.  Noted to be 4.8 this morning.  Followed by Dr. Joelyn Oms with [facility].  Nephrology was consulted.   Creatinine appears to be gradually improving.   Monitor urine output.  Renal ultrasound does not show any hydronephrosis.  Lisinopril and furosemide on hold currently.  History of cardiomyopathy Echocardiogram from this admission shows left ventricular EF of 45 to 50%.  Echocardiogram from August 2023 showed EF to be 55 to 60%.  Back in 2015 she had a EF of 30 to 35%. Seems to be fairly euvolemic currently.  Furosemide and lisinopril on hold due to elevated creatinine.  History of coronary artery disease This is nonobstructive based  on cardiac catheterization in 2015.  Renal artery stenosis Underwent stent placement in February 2020.  1-59% stenosis of the left renal artery noted on renal  vascular study.  Not significant enough to merit any urgent intervention.   Continue statin.  Start aspirin at discharge.  Normocytic anemia Likely anemia of chronic disease.  No evidence of overt bleeding.  Drop in hemoglobin noted this morning.  Will recheck tomorrow.  Vitamin B12 deficiency Vitamin B12 noted to be low at 231.  Started on supplementation.   DVT Prophylaxis: Subcutaneous heparin Code Status: Full code Family Communication: Discussed with patient Disposition Plan: Hopefully return home when improved  Status is: Inpatient Remains inpatient appropriate because: Hypertensive emergency, acute kidney injury      Medications: Scheduled:  amLODipine  10 mg Oral Daily   carvedilol  6.25 mg Oral BID WC   cyanocobalamin  1,000 mcg Intramuscular Daily   Followed by   Derrill Memo ON 11/12/2022] vitamin B-12  1,000 mcg Oral Daily   docusate sodium  100 mg Oral BID   haloperidol lactate  1 mg Intramuscular Once   heparin  5,000 Units Subcutaneous Q8H   insulin aspart  0-15 Units Subcutaneous TID WC   insulin aspart  0-5 Units Subcutaneous QHS   iron polysaccharides  150 mg Oral Daily   isosorbide mononitrate  30 mg Oral Daily   lidocaine  15 mL Oral Once   pantoprazole  40 mg Oral Daily   QUEtiapine  25 mg Oral QHS   rosuvastatin  20 mg Oral Daily   Continuous: [id number] **OR** acetaminophen, calcium carbonate, labetalol, methocarbamol, ondansetron **OR** ondansetron (ZOFRAN) IV, oxyCODONE, polyethylene glycol, traZODone  Antibiotics: Anti-infectives (From admission, onward)    None       Objective:  Vital Signs  Vitals:   11/07/22 1629 11/07/22 1743 11/07/22 2119 11/08/22 0418  BP: (!) 169/85 (!) 172/89 (!) 140/72 137/71  Pulse: 71 85 66 78  Resp: 16  17 17   Temp: 98.3 F (36.8 C)  97.9 F (36.6 C) 98 F (36.7 C)  TempSrc: Oral     SpO2: 99%  100% 100%  Weight:      Height:        Intake/Output Summary (Last 24 hours) at 11/08/2022 0905 Last  data filed at 11/07/2022 1842 Gross per 24 hour  Intake 180 ml  Output --  Net 180 ml    Filed Weights   11/04/22 2048 11/06/22 0551  Weight: 74.8 kg 70.3 kg    General appearance: Somnolent but easily arousable.  No agitation noted this morning. Resp: Clear to auscultation bilaterally.  Normal effort Cardio: S1-S2 is normal regular.  No S3-S4.  No rubs murmurs or bruit GI: Abdomen is soft.  Nontender nondistended.  Bowel sounds are present normal.  No masses organomegaly Extremities: No edema.  Full range of motion of lower extremities. Neurologic: No obvious focal neurological deficits.   Lab Results:  Data Reviewed: I have personally reviewed following labs and reports of the imaging studies  CBC: Recent Labs  Lab 11/02/22 1700 11/04/22 2129 11/05/22 1100 11/06/22 0744 11/07/22 0446 11/08/22 0410  WBC 5.6 5.5 4.7 4.3 5.4 4.2  NEUTROABS 3.5  --   --   --   --   --   HGB 9.6* 9.1* 8.6* 9.2* 9.8* 8.4*  HCT 30.1* 29.1* 27.3* 29.4* 30.9* 26.8*  MCV 88.5 89.3 89.2 89.1 89.0 91.2  PLT 238 228 202 222 254 214     Basic Metabolic Panel: Recent  Labs  Lab 11/02/22 1700 11/04/22 2129 11/05/22 1100 11/06/22 0744 11/07/22 0446 11/08/22 0410  NA 143 139  --  141 140 144  K 4.4 4.0  --  4.0 4.2 4.0  CL 107 104  --  105 103 109  CO2 21* 24  --  26 25 25   GLUCOSE 109* 113*  --  119* 156* 87  BUN 39* 38*  --  45* 53* 46*  CREATININE 4.61* 4.61* 4.71* 4.80* 4.71* 4.36*  CALCIUM 8.9 8.8*  --  8.7* 9.0 8.7*  MG 1.9  --   --   --   --   --      GFR: Estimated Creatinine Clearance: 11.2 mL/min (A) (by C-G formula based on SCr of 4.36 mg/dL (H)).  Liver Function Tests: Recent Labs  Lab 11/02/22 1700  AST 19  ALT 11  ALKPHOS 51  BILITOT 0.3  PROT 6.4*  ALBUMIN 3.2*      HbA1C: Recent Labs    11/05/22 1100  HGBA1C 6.3*     CBG: Recent Labs  Lab 11/07/22 0744 11/07/22 1050 11/07/22 1629 11/07/22 2126 11/08/22 0747  GLUCAP 211* 103* 122* 94 90        Radiology Studies: VAS US RENAL ARTERY DUPLEX  Result Date: 11/06/2022 ABDOMINAL VISCERAL Patient Name:  Teresa Poole  Date of Exam:   11/06/2022 Medical Rec #: [id number]           Accession #:    [id number] Date of Birth: June 17, 1951           Patient Gender: F Patient Age:   17 years Exam Location:  [facility] Procedure:      VAS US RENAL ARTERY DUPLEX Referring Phys: [id number] Bonnielee Haff -------------------------------------------------------------------------------- Indications: Hypertension High Risk Factors: Hypertension, hyperlipidemia, Diabetes. Vascular Interventions: Left renal artery stent. Limitations: Air/bowel gas. Comparison Study: No prior studies. Performing Technologist: Oliver Hum RVT  Examination Guidelines: A complete evaluation includes B-mode imaging, spectral Doppler, color Doppler, and power Doppler as needed of all accessible portions of each vessel. Bilateral testing is considered an integral part of a complete examination. Limited examinations for reoccurring indications may be performed as noted.  Duplex Findings: +--------------------+--------+--------+------+------------------+ Mesenteric          PSV cm/sEDV cm/sPlaque     Comments      +--------------------+--------+--------+------+------------------+ Aorta Mid              58      8                             +--------------------+--------+--------+------+------------------+ Celiac Artery Origin                      Unable to insonate +--------------------+--------+--------+------+------------------+ SMA Origin                                Unable to insonate +--------------------+--------+--------+------+------------------+    +------------------+--------+--------+-------+ Right Renal ArteryPSV cm/sEDV cm/sComment +------------------+--------+--------+-------+ Origin              108      14           +------------------+--------+--------+-------+ Proximal              74      13           +------------------+--------+--------+-------+ Mid  110      13           +------------------+--------+--------+-------+ Distal               81      10           +------------------+--------+--------+-------+ +-----------------+--------+--------+-------+ Left Renal ArteryPSV cm/sEDV cm/sComment +-----------------+--------+--------+-------+ Origin             112      15           +-----------------+--------+--------+-------+ Proximal           190      32           +-----------------+--------+--------+-------+ Mid                 38      7            +-----------------+--------+--------+-------+ Distal              35      6            +-----------------+--------+--------+-------+ +------------+--------+--------+----+-----------+--------+--------+----+ Right KidneyPSV cm/sEDV cm/sRI  Left KidneyPSV cm/sEDV cm/sRI   +------------+--------+--------+----+-----------+--------+--------+----+ Upper Pole  30      5       0.82Upper Pole 33      6       0.82 +------------+--------+--------+----+-----------+--------+--------+----+ Mid         26      5       0.80Mid        37      8       0.79 +------------+--------+--------+----+-----------+--------+--------+----+ Lower Pole  20      5       0.77Lower Pole 24      10      0.59 +------------+--------+--------+----+-----------+--------+--------+----+ Hilar       96      10      0.90Hilar      31      7       0.77 +------------+--------+--------+----+-----------+--------+--------+----+ +------------------+----+------------------+----+ Right Kidney          Left Kidney            +------------------+----+------------------+----+ RAR                   RAR                    +------------------+----+------------------+----+ RAR (manual)      1.9 RAR (manual)      3.3  +------------------+----+------------------+----+ Cortex                 Cortex                 +------------------+----+------------------+----+ Cortex thickness      Corex thickness        +------------------+----+------------------+----+ Kidney length (cm)9.50Kidney length (cm)8.50 +------------------+----+------------------+----+  Summary: Renal:  Right: Normal size right kidney. Abnormal right Resistive Index. No        evidence of right renal artery stenosis. Left:  Normal size of left kidney. Abnormal left Resisitve Index.        1-59% stenosis of the left renal artery.  *See table(s) above for measurements and observations.  Diagnosing physician: Servando Snare MD  Electronically signed by Servando Snare MD on 11/06/2022 at 4:53:43 PM.    Final    ECHOCARDIOGRAM COMPLETE  Result Date: 11/06/2022    ECHOCARDIOGRAM REPORT   Patient Name:   Teresa Poole Date of Exam:  11/06/2022 Medical Rec #:  [id number]          Height:       66.0 in Accession #:    [id number]         Weight:       155.0 lb Date of Birth:  May 09, 1951          BSA:          1.794 m Patient Age:    64 years           BP:           147/63 mmHg Patient Gender: F                  HR:           69 bpm. Exam Location:  Inpatient Procedure: 2D Echo, Color Doppler and Cardiac Doppler Indications:    R07.9* Chest pain, unspecified  History:        Patient has prior history of Echocardiogram examinations. CHF,                 CAD, Arrythmias:RBBB; Risk Factors:Dyslipidemia, Diabetes and                 Hypertension.  Sonographer:    Phineas Douglas Referring Phys: [id number] [place]  1. Left ventricular ejection fraction, by estimation, is 45 to 50%. The left ventricle has mildly decreased function. The left ventricle demonstrates global hypokinesis. There is mild concentric left ventricular hypertrophy. Left ventricular diastolic parameters are consistent with Grade II diastolic dysfunction (pseudonormalization).  2. Right ventricular systolic function is normal. The right ventricular size is  normal.  3. Left atrial size was moderately dilated.  4. The mitral valve is grossly normal. Trivial mitral valve regurgitation. No evidence of mitral stenosis.  5. The aortic valve is tricuspid. There is mild calcification of the aortic valve. There is mild thickening of the aortic valve. Aortic valve regurgitation is trivial. Aortic valve sclerosis/calcification is present, without any evidence of aortic stenosis.  6. The inferior vena cava is normal in size with greater than 50% respiratory variability, suggesting right atrial pressure of 3 mmHg. Comparison(s): Prior images reviewed side by side. Changes from prior study are noted. Conclusion(s)/Recommendation(s): Mildly reduced LVEF compared to prior, in a pattern of global hypokinesis. No focal wall motion abnormalities noted. FINDINGS  Left Ventricle: Left ventricular ejection fraction, by estimation, is 45 to 50%. The left ventricle has mildly decreased function. The left ventricle demonstrates global hypokinesis. The left ventricular internal cavity size was normal in size. There is  mild concentric left ventricular hypertrophy. Left ventricular diastolic parameters are consistent with Grade II diastolic dysfunction (pseudonormalization). Right Ventricle: The right ventricular size is normal. No increase in right ventricular wall thickness. Right ventricular systolic function is normal. Left Atrium: Left atrial size was moderately dilated. Right Atrium: Right atrial size was normal in size. Pericardium: Trivial pericardial effusion is present. Mitral Valve: The mitral valve is grossly normal. Trivial mitral valve regurgitation. No evidence of mitral valve stenosis. Tricuspid Valve: The tricuspid valve is grossly normal. Tricuspid valve regurgitation is trivial. No evidence of tricuspid stenosis. Aortic Valve: The aortic valve is tricuspid. There is mild calcification of the aortic valve. There is mild thickening of the aortic valve. Aortic valve  regurgitation is trivial. Aortic regurgitation PHT measures 395 msec. Aortic valve sclerosis/calcification is present, without any evidence of aortic stenosis. Pulmonic Valve: The pulmonic valve was not well visualized. Pulmonic valve regurgitation is  not visualized. No evidence of pulmonic stenosis. Aorta: The aortic root, ascending aorta, aortic arch and descending aorta are all structurally normal, with no evidence of dilitation or obstruction. Venous: The inferior vena cava is normal in size with greater than 50% respiratory variability, suggesting right atrial pressure of 3 mmHg. IAS/Shunts: The atrial septum is grossly normal.  LEFT VENTRICLE PLAX 2D LVIDd:         5.00 cm      Diastology LVIDs:         3.90 cm      LV e' medial:    4.68 cm/s LV PW:         1.20 cm      LV E/e' medial:  24.4 LV IVS:        1.20 cm      LV e' lateral:   5.22 cm/s LVOT diam:     1.90 cm      LV E/e' lateral: 21.8 LV SV:         62 LV SV Index:   34 LVOT Area:     2.84 cm  LV Volumes (MOD) LV vol d, MOD A2C: 121.0 ml LV vol d, MOD A4C: 136.0 ml LV vol s, MOD A2C: 61.2 ml LV vol s, MOD A4C: 71.0 ml LV SV MOD A2C:     59.8 ml LV SV MOD A4C:     136.0 ml LV SV MOD BP:      65.0 ml RIGHT VENTRICLE             IVC RV Basal diam:  3.70 cm     IVC diam: 1.90 cm RV S prime:     12.10 cm/s TAPSE (M-mode): 2.0 cm LEFT ATRIUM             Index        RIGHT ATRIUM           Index LA diam:        3.70 cm 2.06 cm/m   RA Area:     14.40 cm LA Vol (A2C):   83.9 ml 46.76 ml/m  RA Volume:   34.40 ml  19.17 ml/m LA Vol (A4C):   67.0 ml 37.34 ml/m LA Biplane Vol: 75.3 ml 41.96 ml/m  AORTIC VALVE LVOT Vmax:   91.40 cm/s LVOT Vmean:  64.500 cm/s LVOT VTI:    0.217 m AI PHT:      395 msec  AORTA Ao Root diam: 2.80 cm Ao Asc diam:  3.10 cm MITRAL VALVE MV Area (PHT): 2.84 cm     SHUNTS MV Decel Time: 267 msec     Systemic VTI:  0.22 m MV E velocity: 114.00 cm/s  Systemic Diam: 1.90 cm MV A velocity: 124.00 cm/s MV E/A ratio:  0.92 Buford Dresser MD Electronically signed by Buford Dresser MD Signature Date/Time: 11/06/2022/2:58:31 PM    Final    US RENAL  Result Date: 11/06/2022 CLINICAL DATA:  Acute kidney injury EXAM: RENAL / URINARY TRACT ULTRASOUND COMPLETE COMPARISON:  None Available. FINDINGS: Right Kidney: Renal measurements: 6.6 x 4.2 x 3.2 cm = volume: 46.4 mL. No hydronephrosis. Increased renal cortical echogenicity. Small simple cysts measuring up to 1.7 and 1.8 cm. Left Kidney: Renal measurements: 6.6 x 3.9 x 3.4 cm = volume: 46.1 mL. No hydronephrosis. Increased renal cortical echogenicity. Bladder: Partially obscured by bowel gas.  Appears mildly distended. Other: None. IMPRESSION: No hydronephrosis. Increased renal cortical echogenicity bilaterally, as can be seen in medical renal  disease. Electronically Signed   By: Maurine Simmering M.D.   On: 11/06/2022 12:37       LOS: 3 days   [facility] Pager on [email or web address]  11/08/2022, 9:05 AM

## 2022-11-08 NOTE — Consult Note (Signed)
[facility] Consult   Reason for Consult:  Agitation Referring Physician:  Dr. Maryland Pink Patient Identification: CECIA Poole MRN:  [id number] Principal Diagnosis: Atypical chest pain Diagnosis:  Principal Problem:   Atypical chest pain Active Problems:   Hypertension associated with diabetes ([place])   Type 2 diabetes mellitus with chronic kidney disease, without long-term current use of insulin ([place])   Hypertensive urgency   Cardiomyopathy, hypertensive ([place])   Anemia of chronic kidney failure, stage 4 (severe) (HCC)   Chronic combined systolic and diastolic heart failure (HCC)   Hyperlipidemia associated with type 2 diabetes mellitus ([place])   Renal artery stenosis (HCC)   (HFpEF) heart failure with preserved ejection fraction (HCC)   Acute kidney injury ([place])   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 female patient admitted with Atypical chest pain.  HPI:   On Interview 11/07/2022 Patient seen laying in bed this afternoon accompanied by sitter. The patient is a poor historian and reports that she is currently in the hospital because, "the doctors brought me here to get money." When asked how she arrived to the hospital she reported that she lost consciousness and ended up in the hospital without knowing why.   The patient was also asked about her need for restraints earlier in the shift. She attributed this to a disagreement she had with the hospital staff. She was attempting to get ready for discharge and she could not find her clothes. The patient had no insight into her need for hospitalization or the issues with her kidney function.  She denied any previous psychiatric history however the patient's sister reports that the patient has seen psychiatry before but refused to adhere to the recommendation of starting  Zyprexa that was made.   She admits to making threats of not wanting to live but states that she says things like that when she is frustrated. She denies any true plan or intent to harm herself or anyone else.   She reports good sleep at night but her sitter at bedside reports that the patient was up all night and did not get any rest until she received PRN Haldol.  On Interview 11/08/2022 Patient seen laying in bed accompanied by sitter on my approach this afternoon. She reports that she is doing well but she is still focused on discharge home stating that she needs to get back to the coffee shop where she spends time with her friends. She still lacks insight into why she is in the hospital and she is unable to have a coherent discussion regarding her medical treatment.  Past Psychiatric History: Denies but per sister the patient was prescribed Zyprexa  Collateral: Inella Kendal Sister [phone] She reports that she has been concerned about the patient's behavior for years since her father died. She reports that the patient has not been taking care of herself from a medical standpoint and she has not been taking care of her home and it is constantly in [place]. She has attempted to get the patient's home cleaned and help her with her medical appointments but the patient just gets frustrated with her. She does not think that the patient is a danger of harming herself but she does feel that the patient needs further mental health treatment.  Past Medical History:  Past Medical History:  Diagnosis Date   Allergy  Anemia    Carotid stenosis    Chronic combined systolic and diastolic CHF (congestive heart failure) (HCC)    CKD (chronic kidney disease), stage IV ([place]) 09/24/2013   Pt at [facility], Dr. Joelyn Oms   COVID-19 10/24/2021   Diabetes mellitus type 2 in nonobese [facility])    Edema 09/14/2013   GERD (gastroesophageal reflux disease)    History of ovarian cancer     Hyperlipidemia    Hypertension    Mild CAD    non-obstructive by LHC (09/25/2013): Proximal and mid LAD serial 20%, proximal circumflex 30%, mid AV groove circumflex 30%, mid RCA mild plaque.   NICM (nonischemic cardiomyopathy) (HCC)    Obesity (BMI 30-39.9)    RBBB    Renal artery stenosis ([place]) 10/11/2018   Thyroid disease    Seen by specialist    Past Surgical History:  Procedure Laterality Date   LEFT HEART CATHETERIZATION WITH CORONARY ANGIOGRAM N/A 09/25/2013   Procedure: LEFT HEART CATHETERIZATION WITH CORONARY ANGIOGRAM;  Surgeon: Burnell Blanks, MD;  Location: [facility] CATH LAB;  Service: Cardiovascular;  Laterality: N/A;   RENAL ANGIOGRAPHY N/A 10/06/2018   Procedure: RENAL ANGIOGRAPHY;  Surgeon: Marty Heck, MD;  Location: [facility] CV LAB;  Service: Cardiovascular;  Laterality: N/A;   Family Psychiatric  History: Denies Social History:  Social History   Substance and Sexual Activity  Alcohol Use No     Social History   Substance and Sexual Activity  Drug Use No    Social History   Socioeconomic History   Marital status: Single    Spouse name: Not on file   Number of children: 0   Years of education: Not on file   Highest education level: Not on file  Occupational History    Employer: [employer]  Tobacco Use   Smoking status: Never   Smokeless tobacco: Never  Vaping Use   Vaping Use: Never used  Substance and Sexual Activity   Alcohol use: No   Drug use: No   Sexual activity: Not on file  Other Topics Concern   Not on file  Social History Narrative   Works at [employer]   Patient lives at home alone.    Patient has no children.    Patient patient is right handed.    Patient is single.    Social Determinants of Health   Financial Resource Strain: Not on file  Food Insecurity: No Food Insecurity (11/08/2022)   Hunger Vital Sign    Worried About Running Out of Food in the Last Year: Never true    Ran Out of Food in the Last Year:  Never true  Transportation Needs: No Transportation Needs (11/08/2022)   PRAPARE - Hydrologist (Medical): No    Lack of Transportation (Non-Medical): No  Physical Activity: Not on file  Stress: Not on file  Social Connections: Not on file   Additional Social History:    Allergies:   Allergies  Allergen Reactions   Hydralazine Hcl Itching    ENTIRE BODY = burning sensation, also in the eyes (they have become very sensitive to light)   Atorvastatin Other (See Comments)    Per MD - pt not sure of reaction     Labs:  Results for orders placed or performed during the hospital encounter of 11/04/22 (from the past 48 hour(s))  Glucose, capillary     Status: Abnormal   Collection Time: 11/06/22  5:06 PM  Result  Value Ref Range   Glucose-Capillary 178 (H) 70 - 99 mg/dL    Comment: Glucose reference range applies only to samples taken after fasting for at least 8 hours.  Glucose, capillary     Status: Abnormal   Collection Time: 11/06/22  5:28 PM  Result Value Ref Range   Glucose-Capillary 169 (H) 70 - 99 mg/dL    Comment: Glucose reference range applies only to samples taken after fasting for at least 8 hours.  Iron and TIBC     Status: None   Collection Time: 11/06/22  7:29 PM  Result Value Ref Range   Iron 33 28 - 170 ug/dL   TIBC 300 250 - 450 ug/dL   Saturation Ratios 11 10.4 - 31.8 %   UIBC 267 ug/dL    Comment: Performed at [facility], [place] [address] [place], [place] [address]  Ferritin     Status: None   Collection Time: 11/06/22  7:29 PM  Result Value Ref Range   Ferritin 21 11 - 307 ng/mL    Comment: Performed at [facility], [place] [address] [place], [place] [address]  Glucose, capillary     Status: Abnormal   Collection Time: 11/06/22  9:41 PM  Result Value Ref Range   Glucose-Capillary 160 (H) 70 - 99 mg/dL    Comment: Glucose reference range applies only to samples taken after fasting for at  least 8 hours.  CBC     Status: Abnormal   Collection Time: 11/07/22  4:46 AM  Result Value Ref Range   WBC 5.4 4.0 - 10.5 K/uL   RBC 3.47 (L) 3.87 - 5.11 MIL/uL   Hemoglobin 9.8 (L) 12.0 - 15.0 g/dL   HCT 30.9 (L) 36.0 - 46.0 %   MCV 89.0 80.0 - 100.0 fL   MCH 28.2 26.0 - 34.0 pg   MCHC 31.7 30.0 - 36.0 g/dL   RDW 13.6 11.5 - 15.5 %   Platelets 254 150 - 400 K/uL   nRBC 0.0 0.0 - 0.2 %    Comment: Performed at [facility], [place] [address], [place], [place] [id number]  Basic metabolic panel     Status: Abnormal   Collection Time: 11/07/22  4:46 AM  Result Value Ref Range   Sodium 140 135 - 145 mmol/L   Potassium 4.2 3.5 - 5.1 mmol/L   Chloride 103 98 - 111 mmol/L   CO2 25 22 - 32 mmol/L   Glucose, Bld 156 (H) 70 - 99 mg/dL    Comment: Glucose reference range applies only to samples taken after fasting for at least 8 hours.   BUN 53 (H) 8 - 23 mg/dL   Creatinine, Ser 4.71 (H) 0.44 - 1.00 mg/dL   Calcium 9.0 8.9 - 10.3 mg/dL   GFR, Estimated 9 (L) >60 mL/min    Comment: (NOTE) Calculated using the CKD-EPI Creatinine Equation (2021)    Anion gap 12 5 - 15    Comment: Performed at [facility], [place] [address], [place], [place] [address]  Vitamin B12     Status: None   Collection Time: 11/07/22  4:46 AM  Result Value Ref Range   Vitamin B-12 231 180 - 914 pg/mL    Comment: (NOTE) This assay is not validated for testing neonatal or myeloproliferative syndrome specimens for Vitamin B12 levels. Performed at [facility], [place] [address], [place], [place] [address]   Folate     Status: None  Collection Time: 11/07/22  4:46 AM  Result Value Ref Range   Folate 13.0 >5.9 ng/mL    Comment: Performed at [facility], [place] [address] [place], [place] [address]  Reticulocytes     Status: Abnormal   Collection Time: 11/07/22  4:46 AM  Result Value Ref Range   Retic Ct Pct 1.0 0.4 - 3.1 %   RBC. 3.38 (L) 3.87  - 5.11 MIL/uL   Retic Count, Absolute 35.2 19.0 - 186.0 K/uL   Immature Retic Fract 15.0 2.3 - 15.9 %    Comment: Performed at [facility], [place] [address], [place], [place] [address]  Glucose, capillary     Status: Abnormal   Collection Time: 11/07/22  7:44 AM  Result Value Ref Range   Glucose-Capillary 211 (H) 70 - 99 mg/dL    Comment: Glucose reference range applies only to samples taken after fasting for at least 8 hours.  Glucose, capillary     Status: Abnormal   Collection Time: 11/07/22 10:50 AM  Result Value Ref Range   Glucose-Capillary 103 (H) 70 - 99 mg/dL    Comment: Glucose reference range applies only to samples taken after fasting for at least 8 hours.  Glucose, capillary     Status: Abnormal   Collection Time: 11/07/22  4:29 PM  Result Value Ref Range   Glucose-Capillary 122 (H) 70 - 99 mg/dL    Comment: Glucose reference range applies only to samples taken after fasting for at least 8 hours.  Glucose, capillary     Status: None   Collection Time: 11/07/22  9:26 PM  Result Value Ref Range   Glucose-Capillary 94 70 - 99 mg/dL    Comment: Glucose reference range applies only to samples taken after fasting for at least 8 hours.  CBC     Status: Abnormal   Collection Time: 11/08/22  4:10 AM  Result Value Ref Range   WBC 4.2 4.0 - 10.5 K/uL   RBC 2.94 (L) 3.87 - 5.11 MIL/uL   Hemoglobin 8.4 (L) 12.0 - 15.0 g/dL   HCT 26.8 (L) 36.0 - 46.0 %   MCV 91.2 80.0 - 100.0 fL   MCH 28.6 26.0 - 34.0 pg   MCHC 31.3 30.0 - 36.0 g/dL   RDW 14.1 11.5 - 15.5 %   Platelets 214 150 - 400 K/uL   nRBC 0.0 0.0 - 0.2 %    Comment: Performed at [facility], [place] [address] [place], [place] [id number]  Basic metabolic panel     Status: Abnormal   Collection Time: 11/08/22  4:10 AM  Result Value Ref Range   Sodium 144 135 - 145 mmol/L   Potassium 4.0 3.5 - 5.1 mmol/L   Chloride 109 98 - 111 mmol/L   CO2 25 22 - 32 mmol/L   Glucose, Bld 87 70 - 99  mg/dL    Comment: Glucose reference range applies only to samples taken after fasting for at least 8 hours.   BUN 46 (H) 8 - 23 mg/dL   Creatinine, Ser 4.36 (H) 0.44 - 1.00 mg/dL   Calcium 8.7 (L) 8.9 - 10.3 mg/dL   GFR, Estimated 10 (L) >60 mL/min    Comment: (NOTE) Calculated using the CKD-EPI Creatinine Equation (2021)    Anion gap 10 5 - 15    Comment: Performed at [facility], [place] [address] [place], [place] [address]  Glucose, capillary     Status: None  Collection Time: 11/08/22  7:47 AM  Result Value Ref Range   Glucose-Capillary 90 70 - 99 mg/dL    Comment: Glucose reference range applies only to samples taken after fasting for at least 8 hours.   Comment 1 Notify RN   Glucose, capillary     Status: Abnormal   Collection Time: 11/08/22 11:47 AM  Result Value Ref Range   Glucose-Capillary 184 (H) 70 - 99 mg/dL    Comment: Glucose reference range applies only to samples taken after fasting for at least 8 hours.   Comment 1 Notify RN     Current Facility-Administered Medications  Medication Dose Route Frequency Provider Last Rate Last Admin   acetaminophen (TYLENOL) tablet 650 mg  650 mg Oral Q6H PRN Hollice Gong, Mir M, MD       Or   acetaminophen (TYLENOL) suppository 650 mg  650 mg Rectal Q6H PRN Hollice Gong, Mir M, MD       amLODipine (NORVASC) tablet 10 mg  10 mg Oral Daily Tobb, Kardie, DO   10 mg at 11/08/22 1011   calcium carbonate (TUMS - dosed in mg elemental calcium) chewable tablet 400 mg of elemental calcium  400 mg of elemental calcium Oral Q6H PRN Raenette Rover, NP   400 mg of elemental calcium at 11/07/22 0341   carvedilol (COREG) tablet 6.25 mg  6.25 mg Oral BID WC Vikki Ports R, PA-C   6.25 mg at 11/08/22 1011   cyanocobalamin (VITAMIN B12) injection 1,000 mcg  1,000 mcg Intramuscular Daily Bonnielee Haff, MD   1,000 mcg at 11/08/22 1011   Followed by   Derrill Memo ON 11/12/2022] cyanocobalamin (VITAMIN B12) tablet 1,000 mcg  1,000 mcg  Oral Daily Bonnielee Haff, MD       docusate sodium (COLACE) capsule 100 mg  100 mg Oral BID Hollice Gong, Mir M, MD   100 mg at 11/08/22 1011   haloperidol lactate (HALDOL) injection 0.5 mg  0.5 mg Intravenous Q6H PRN Bonnielee Haff, MD       heparin injection 5,000 Units  5,000 Units Subcutaneous Q8H Hollice Gong, Mir M, MD   5,000 Units at 11/07/22 2123   insulin aspart (novoLOG) injection 0-15 Units  0-15 Units Subcutaneous TID WC Hollice Gong, Mir M, MD   3 Units at 11/08/22 1210   insulin aspart (novoLOG) injection 0-5 Units  0-5 Units Subcutaneous QHS Hollice Gong, Mir M, MD       iron polysaccharides (NIFEREX) capsule 150 mg  150 mg Oral Daily Harrie Jeans C, MD   150 mg at 11/08/22 1012   isosorbide mononitrate (IMDUR) 24 hr tablet 30 mg  30 mg Oral Daily Harrie Jeans C, MD   30 mg at 11/08/22 1011   labetalol (NORMODYNE) injection 10 mg  10 mg Intravenous Q2H PRN Hollice Gong, Mir M, MD   10 mg at 11/07/22 1018   lidocaine (XYLOCAINE) 2 % viscous mouth solution 15 mL  15 mL Oral Once Raenette Rover, NP       methocarbamol (ROBAXIN) tablet 750 mg  750 mg Oral Q6H PRN Hollice Gong, Mir M, MD   750 mg at 11/07/22 1014   ondansetron (ZOFRAN) tablet 4 mg  4 mg Oral Q6H PRN Hollice Gong, Mir M, MD       Or   ondansetron [facility]) injection 4 mg  4 mg Intravenous Q6H PRN Hollice Gong, Mir M, MD       oxyCODONE (Oxy IR/ROXICODONE) immediate release tablet 5 mg  5 mg Oral Q4H PRN Lucillie Garfinkel, MD  5 mg at 11/07/22 2123   pantoprazole (PROTONIX) EC tablet 40 mg  40 mg Oral Daily Hollice Gong, Mir M, MD   40 mg at 11/08/22 1011   polyethylene glycol (MIRALAX / GLYCOLAX) packet 17 g  17 g Oral Daily PRN Hollice Gong, Mir M, MD       QUEtiapine (SEROQUEL) tablet 50 mg  50 mg Oral QHS Bonnielee Haff, MD       rosuvastatin (CRESTOR) tablet 20 mg  20 mg Oral Daily Hollice Gong, Mir M, MD   20 mg at 11/08/22 1011    Psychiatric Specialty Exam:  Presentation  General Appearance:  Fairly Groomed  Eye  Contact: Fleeting  Speech: Normal Rate  Speech Volume: Normal  Handedness:No data recorded  Mood and Affect  Mood: -- (ok)  Affect: Constricted   Thought Process  Thought Processes: Disorganized  Descriptions of Associations:Intact  Orientation:-- (oriented to person and place. Disoriented to time and situation)  Thought Content:Scattered  History of Schizophrenia/Schizoaffective disorder:No data recorded Duration of Psychotic Symptoms:No data recorded Hallucinations:Hallucinations: None  Ideas of Reference:None  Suicidal Thoughts:Suicidal Thoughts: No  Homicidal Thoughts:Homicidal Thoughts: No   Sensorium  Memory: Recent Poor; Remote Good  Judgment: Poor  Insight: Poor   Executive Functions  Concentration: Poor  Attention Span: Poor  Recall: Poor  Fund of Knowledge: Poor  Language: Poor   Psychomotor Activity  Psychomotor Activity:No data recorded  Assets  Assets:No data recorded  Sleep  Sleep: Sleep: Fair   Physical Exam: Physical Exam ROS Blood pressure 138/63, pulse 76, temperature 98.4 F (36.9 C), temperature source Oral, resp. rate 18, height 5\' 6"  (1.676 m), weight 70.3 kg, SpO2 100 %. Body mass index is 25.01 kg/m.  Treatment Plan Summary:  Major Neurocognitive Disorder with behavioral disturbance -Agree with continuing Seroquel 50 mg PO QHS   Disposition:  -Would not recommended IVC at this time however if the patient attempts to leave AMA it would be appropriate to initiate IVC at that time -Continue 1:1 Sitter -Psychiatry will continue to follow  Pecolia Ades, DO 11/08/2022 2:40 PM

## 2022-11-08 NOTE — Progress Notes (Signed)
[facility] Progress Note  Name: Teresa Poole MRN: [id number] DOB: [birth year]  Chief Complaint:  Chest pain (on admission)  Subjective:  Strict ins/outs not available.  She had 3 unmeasured urine voids over 3/16.  She denies any difficulty with urination.  Happy to here about her kidney function getting better.  Spoke with psych - they have just seen her.  She has a Actuary at bedside.  She called her sister during my visit (patient attempted to call her 4 times) and we were not able to reach her.  She gave me the phone number of [phone] for [facility] cell phone.  Patient had previously shared with me that she was unmarried and without children and Teresa Poole is her next of kin.  I called Teresa Poole today after the visit on perhaps a home phone.  Their father passed in October 2022 - she states that the patient's memory has gotten worse since then and that she is "a whole different person".  [name] and I discussed the risks/benefits/indications for ESA and she did consent to ESA.   Review of systems:   Denies current n/v Denies shortness of breath or chest pain  No neck or jaw pain   ---------------- Background on consult:  Teresa Poole is a 72 y.o. female with a history of CKD stage V, hypertension, type 2 diabetes mellitus, chronic combined systolic and diastolic CHF, coronary artery disease, obesity, and renal artery stenosis who presented to the hospital with chest pain with left neck pain.  She was found to have uncontrolled HTN and has been treated as hypertensive urgency/emergency.  Cardiology was consulted and she was admitted to medicine.  They felt not consistent with ACS.  She was found to have acute kidney injury on top of her chronic kidney disease.  Strict ins/outs are not available.  She had two unmeasured urine voids over 3/14.  Nephrology was consulted for assistance with management.  Her creatinine trends are included below.  She has had a couple of AKI events  over the past year with peak creatinine in the low to mid 4's during those times.  Home meds include lasix and lisinopril per charting here.  I do not see lisinopril or lasix on current outpatient med list through our office.  She last got lisinopril here on 3/14.  Reported compliance with home meds.  Per charting she lives alone and does have some trouble with her memory.  She had a renal artery duplex today with no evidence of right renal artery stenosis and 1-59% stenosis of the left renal artery.  Note she previously underwent left renal artery stent placement in 09/2018 with Dr. Carlis Abbott.  She follows with Dr. Joelyn Oms in our office and last saw him on 09/29/22.  She expressed during that visit that she would never want dialysis and has declined dialysis access; she cited watching multiple people in her life who have been on dialysis.  She was quoted as stating that she is "dying anyways, what's the point?".   Directly from the note from Dr. Joelyn Oms "She is clear and repeatedly tells me that she would never receive dialysis, she would die first."  During our conversation today she states that she has never discussed this with Dr. Joelyn Oms.  Patient denies any NSAID use.  I called her sister, Teresa Poole, on the phone during our conversation as her memory impairment had been noted.  During our conversation, the patient stated "so I have no kidney function, I have no kidney  function" and [name] and I both reiterated that that is not true.  She repeated that back to both of [place] and [place] encouraged her.  After she hung up the phone, she stated "I have no kidney function.  I know what I'll do - I'll commit suicide.  I'll commit suicide."  I notified her nurse who also came in and assessed her and I paged the on call coverage for the primary team.  They are arranging for a sitter.    Intake/Output Summary (Last 24 hours) at 11/08/2022 1417 Last data filed at 11/08/2022 1214 Gross per 24 hour  Intake 540 ml  Output --  Net 540  ml    Vitals:  Vitals:   11/07/22 2119 11/08/22 0418 11/08/22 0710 11/08/22 1246  BP: (!) 140/72 137/71  138/63  Pulse: 66 78  76  Resp: 17 17 19 18   Temp: 97.9 F (36.6 C) 98 F (36.7 C)  98.4 F (36.9 C)  TempSrc:    Oral  SpO2: 100% 100%  100%  Weight:      Height:         Physical Exam:  General: elderly female in bed in NAD   HEENT: NCAT Eyes: EOMI sclera anicteric Neck: supple; trachea midline  Heart: S1S2 no rub Lungs: clear and unlabored; on room air  Abdomen: soft/nt/nd Extremities: no edema  Neuro: alert and oriented to person and year.  Location of hospital then she points on the wall and states we are at [facility], citing a sign GU: no foley     Medications reviewed   Labs:     Latest Ref Rng & Units 11/08/2022    4:10 AM 11/07/2022    4:46 AM 11/06/2022    7:44 AM  BMP  Glucose 70 - 99 mg/dL 87  156  119   BUN 8 - 23 mg/dL 46  53  45   Creatinine 0.44 - 1.00 mg/dL 4.36  4.71  4.80   Sodium 135 - 145 mmol/L 144  140  141   Potassium 3.5 - 5.1 mmol/L 4.0  4.2  4.0   Chloride 98 - 111 mmol/L 109  103  105   CO2 22 - 32 mmol/L 25  25  26    Calcium 8.9 - 10.3 mg/dL 8.7  9.0  8.7      Assessment/Plan:   # AKI  - Secondary to pre-renal and ischemic insults with hypertensive emergency in the setting of RAAS blockade.  May be exacerbated by rapid lowering of blood pressure (however pt with chest pain which likely prompted same)   - She is stabilizing with supportive measures - Check strict ins/outs  - Agree with holding lisinopril (not on our outpatient EMR for her currently, neither is lasix but patient is poor historian and comes to appointments alone per charting).  Would not resume lisinopril on discharge.  I question if she was actually taking lasix and volume status is controlled off of lasix - She has expressed never wanting dialysis per outpatient charting.  She would also be a poor candidate given untreated psychiatric illness and her memory  impairment - both of which appear chronic - I updated her sister, Teresa Poole via phone.  Patient has poor insight     # CKD stage V  - Secondary to DM and HTN - Baseline Cr variable as above.   - She follows with Dr. Joelyn Oms at [facility].  Renal ultrasound with 6.6 cm kidneys bilaterally which are echogenic c/w  CKD.  Decreased renal reserve/scarring  - She has expressed never wanting dialysis however doesn't recall these conversations.  Would advocate for family member to be present or called for follow-up appointments.  Her sister, Teresa Poole, has said that she is available for the same.    # Hypertensive emergency  - Symptoms improved - BP improved  - agree with holding lisinopril - Continue amlodipine and coreg.  HR limits titration of coreg.  Hx of itching with hydralazine  - Continue isosorbide mononitrate 30 mg daily - this is a new medication    # Chest pain  - Attributed to HTN emergency per cardiology; not felt to represent acute coronary syndrome    # Renal artery stenosis  - s/p left renal artery stent in 09/2018 - Mild current left renal artery stenosis and would be less likely contributing at this point to acute picture    # Normocytic anemia  - iron deficiency  - We have started nu-iron daily - aranesp 40 mcg IV once today. Reassess trends outpatient     # Suicidal ideation - She made the statement to me on 3/15 "I'm going to commit suicide" and then repeated it - Psychiatry was consulted - Per primary team     # Memory impairment  - Per primary team  - note    Disposition - disposition per primary team.  Nephrology will sign off.  I will set up follow-up with Dr. Joelyn Oms or extender in two weeks.  At that appointment would reassess Hb and would reach out to her sister, Teresa Poole,  (cell [phone]).  Alternate number for Teresa Poole is [phone].   Claudia Desanctis, MD 11/08/2022 3:02 PM

## 2022-11-09 DIAGNOSIS — I5022 Chronic systolic (congestive) heart failure: Secondary | ICD-10-CM

## 2022-11-09 DIAGNOSIS — I2489 Other forms of acute ischemic heart disease: Secondary | ICD-10-CM

## 2022-11-09 DIAGNOSIS — I161 Hypertensive emergency: Secondary | ICD-10-CM

## 2022-11-09 DIAGNOSIS — R0789 Other chest pain: Secondary | ICD-10-CM | POA: Diagnosis not present

## 2022-11-09 DIAGNOSIS — F05 Delirium due to known physiological condition: Secondary | ICD-10-CM | POA: Diagnosis not present

## 2022-11-09 DIAGNOSIS — I10 Essential (primary) hypertension: Secondary | ICD-10-CM | POA: Diagnosis not present

## 2022-11-09 DIAGNOSIS — N179 Acute kidney failure, unspecified: Secondary | ICD-10-CM | POA: Diagnosis not present

## 2022-11-09 DIAGNOSIS — N184 Chronic kidney disease, stage 4 (severe): Secondary | ICD-10-CM

## 2022-11-09 LAB — BASIC METABOLIC PANEL
Anion gap: 10 (ref 5–15)
BUN: 57 mg/dL — ABNORMAL HIGH (ref 8–23)
CO2: 25 mmol/L (ref 22–32)
Calcium: 8.7 mg/dL — ABNORMAL LOW (ref 8.9–10.3)
Chloride: 107 mmol/L (ref 98–111)
Creatinine, Ser: 4.78 mg/dL — ABNORMAL HIGH (ref 0.44–1.00)
GFR, Estimated: 9 mL/min — ABNORMAL LOW (ref >60)
Glucose, Bld: 215 mg/dL — ABNORMAL HIGH (ref 70–99)
Potassium: 4.4 mmol/L (ref 3.5–5.1)
Sodium: 142 mmol/L (ref 135–145)

## 2022-11-09 LAB — CBC
HCT: 26 % — ABNORMAL LOW (ref 36.0–46.0)
Hemoglobin: 8.1 g/dL — ABNORMAL LOW (ref 12.0–15.0)
MCH: 28.5 pg (ref 26.0–34.0)
MCHC: 31.2 g/dL (ref 30.0–36.0)
MCV: 91.5 fL (ref 80.0–100.0)
Platelets: 215 10*3/uL (ref 150–400)
RBC: 2.84 MIL/uL — ABNORMAL LOW (ref 3.87–5.11)
RDW: 13.8 % (ref 11.5–15.5)
WBC: 5.7 10*3/uL (ref 4.0–10.5)
nRBC: 0 % (ref 0.0–0.2)

## 2022-11-09 LAB — GLUCOSE, CAPILLARY
Glucose-Capillary: 195 mg/dL — ABNORMAL HIGH (ref 70–99)
Glucose-Capillary: 99 mg/dL (ref 70–99)

## 2022-11-09 MED ORDER — QUETIAPINE FUMARATE 25 MG PO TABS
25.0000 mg | ORAL_TABLET | Freq: Two times a day (BID) | ORAL | Status: DC
  Administered 2022-11-09: 25 mg via ORAL
  Filled 2022-11-09: qty 1

## 2022-11-09 MED ORDER — ASPIRIN 81 MG PO TBEC
81.0000 mg | DELAYED_RELEASE_TABLET | Freq: Every day | ORAL | Status: DC
  Administered 2022-11-09 – 2022-11-13 (×5): 81 mg via ORAL
  Filled 2022-11-09 (×5): qty 1

## 2022-11-09 MED ORDER — QUETIAPINE FUMARATE 25 MG PO TABS
25.0000 mg | ORAL_TABLET | Freq: Two times a day (BID) | ORAL | Status: DC | PRN
  Administered 2022-11-10 – 2022-11-12 (×5): 25 mg via ORAL
  Filled 2022-11-09 (×5): qty 1

## 2022-11-09 MED ORDER — THIAMINE MONONITRATE 100 MG PO TABS
100.0000 mg | ORAL_TABLET | Freq: Every day | ORAL | Status: DC
  Administered 2022-11-10 – 2022-11-13 (×4): 100 mg via ORAL
  Filled 2022-11-09 (×4): qty 1

## 2022-11-09 NOTE — TOC Progression Note (Signed)
Transition of Care [facility]) - Progression Note    Patient Details  Name: Teresa Poole MRN: [id number] Date of Birth: [birth year]  Transition of Care [facility]) CM/SW [place], LCSW Phone Number: 11/09/2022, 11:12 AM  Clinical Narrative:    Completed IVC paperwork has been placed in pt's chart [employer] called to serve pt . IVC will expired in 7 days 11/09/22- 11/15/22. TOC to follow.  Expected Discharge Plan: Home/Self Care Barriers to Discharge: Continued Medical Work up  Expected Discharge Plan and Services   Discharge Planning Services: CM Consult   Living arrangements for the past 2 months: Single Family Home                                       Social Determinants of Health (SDOH) Interventions SDOH Screenings   Food Insecurity: No Food Insecurity (11/08/2022)  Housing: Low Risk  (11/08/2022)  Transportation Needs: No Transportation Needs (11/08/2022)  Utilities: Not At Risk (11/08/2022)  Tobacco Use: Low Risk  (11/05/2022)    Readmission Risk Interventions    10/28/2021    2:56 PM  Readmission Risk Prevention Plan  Transportation Screening Complete  Medication Review ([place]) Complete  PCP or Specialist appointment within 3-5 days of discharge Complete  HRI or [place] Complete  SW Recovery Care/Counseling Consult Complete  [place] Not Applicable

## 2022-11-09 NOTE — Progress Notes (Signed)
[facility] PROGRESS NOTE   Teresa Poole [id number] DOB: August 03, 1951 DOA: 11/04/2022  PCP: [name], MD  Brief History/Interval Summary: 72 y.o. female with medical history significant for obesity, combined chronic systolic and diastolic congestive heart failure, CKD stage IV baseline creatinine about 3, history of renal artery stenosis status post stent about 5 years ago, admitted to the hospital with uncontrolled hypertension and atypical chest pain.  Noted to have systolic blood pressures greater than 200.  There is some concern about noncompliance although patient is certain she takes all of her medications.  Apparently there have been some memory issues recently.  Patient was hospitalized for further management.    Consultants: Cardiology.  Nephrology  Procedures: Echocardiogram    Subjective/Interval History: Patient noted to be awake alert.  Slept well overnight.  No complaints offered.  Has been voiding urine without difficulty.   Assessment/Plan:  Hypertensive emergency Likely reason for her chest discomfort.  Systolic blood pressure was greater than 200.  Patient was given her antihypertensives with improvement in blood pressure.  Cardiology was consulted. She is currently on amlodipine, carvedilol and nitrates.  ACE inhibitor is currently on hold due to worsening creatinine. She was given a dose of clonidine in the emergency department. Blood pressures have improved.  Plan is to continue current treatment for now. Renal ultrasound was done including renal vascular studies which showed some degree of left renal artery stenosis but not significant enough to cause her elevated blood pressures. Carotid Dopplers did show moderate disease.  But she is asymptomatic.  Outpatient management. Blood pressure is reasonably well-controlled.  Continue current regimen for now.  Acute agitation/delirium/suicidal ideation/history of memory impairment. Patient has  underlying cognitive impairment as evidenced by history of memory impairment mentioned by her sister.   She was quite agitated overnight on 3/15.  She was given Ativan without any effect.  She was restrained.  She was given haloperidol and started on Seroquel.  Due to suicidal ideation psychiatry was consulted.  Continue with sitter. EKG reviewed.  Even though the computer is calling it prolonged QT upon my calculation QT interval corrected for heart rate is 458 ms. Seroquel dose was increased to 50 mg yesterday.  Trazodone was discontinued. CT head did not show any acute findings.  TSH was normal when checked in February. Vitamin B12 level noted to be borderline low at 231.  See below May need further workup in the outpatient setting with referral to neurology. Seems to be doing well.  Not as agitated as few days ago.  Disposition will be an issue since the patient does live by herself.  Will wait for psychiatry input today.  Chest pain Atypical.  Likely secondary to elevated blood pressure.  Troponin trends have been flat. Echocardiogram shows diminished left ventricular EF of 45 to 50%.  No further episodes of chest pain noted.  Cardiology does not plan any further workup at this time.  Acute on chronic kidney disease stage IV Baseline creatinine is around 3.7.  Presented with a higher than baseline creatinine.  Followed by Dr. Joelyn Oms with [facility].  Nephrology was consulted.   Creatinine appears to be gradually improving.  Labs are pending from today. Monitor urine output.  Renal ultrasound does not show any hydronephrosis.  Lisinopril and furosemide on hold currently.  History of cardiomyopathy Echocardiogram from this admission shows left ventricular EF of 45 to 50%.  Echocardiogram from August 2023 showed EF to be 55 to 60%.  Back in  2015 she had a EF of 30 to 35%. Seems to be fairly euvolemic currently.  Furosemide and lisinopril on hold due to elevated  creatinine.  History of coronary artery disease This is nonobstructive based on cardiac catheterization in 2015.  Renal artery stenosis Underwent stent placement in February 2020.  1-59% stenosis of the left renal artery noted on renal vascular study.  Not significant enough to merit any urgent intervention.   Continue statin.  Start aspirin at discharge.  Normocytic anemia Likely anemia of chronic disease.  No evidence of overt bleeding.  Drop in hemoglobin possibly dilutional.  Patient is on iron supplements.  Patient was given Aranesp on 3/17.  Vitamin B12 deficiency Vitamin B12 noted to be low at 231.  Started on supplementation.   DVT Prophylaxis: Subcutaneous heparin Code Status: Full code Family Communication: Sister being updated periodically. Disposition Plan: Disposition is unclear considering her cognitive impairment and behavioral issues.  Status is: Inpatient Remains inpatient appropriate because: Hypertensive emergency, acute kidney injury      Medications: Scheduled:  amLODipine  10 mg Oral Daily   carvedilol  6.25 mg Oral BID WC   cyanocobalamin  1,000 mcg Intramuscular Daily   Followed by   Derrill Memo ON 11/12/2022] vitamin B-12  1,000 mcg Oral Daily   docusate sodium  100 mg Oral BID   heparin  5,000 Units Subcutaneous Q8H   insulin aspart  0-15 Units Subcutaneous TID WC   insulin aspart  0-5 Units Subcutaneous QHS   iron polysaccharides  150 mg Oral Daily   isosorbide mononitrate  30 mg Oral Daily   lidocaine  15 mL Oral Once   pantoprazole  40 mg Oral Daily   QUEtiapine  50 mg Oral QHS   rosuvastatin  20 mg Oral Daily   Continuous: [id number] **OR** acetaminophen, calcium carbonate, haloperidol lactate, labetalol, methocarbamol, ondansetron **OR** ondansetron (ZOFRAN) IV, mouth rinse, oxyCODONE, polyethylene glycol  Antibiotics: Anti-infectives (From admission, onward)    None       Objective:  Vital Signs  Vitals:   11/08/22 0710  11/08/22 1246 11/08/22 2007 11/09/22 0610  BP:  138/63 (!) 144/65 (!) 158/62  Pulse:  76 71 70  Resp: 19 18 17 16   Temp:  98.4 F (36.9 C) 98.5 F (36.9 C) 98.1 F (36.7 C)  TempSrc:  Oral Oral Oral  SpO2:  100% 100% 100%  Weight:      Height:        Intake/Output Summary (Last 24 hours) at 11/09/2022 0914 Last data filed at 11/09/2022 0714 Gross per 24 hour  Intake 897 ml  Output 350 ml  Net 547 ml    Filed Weights   11/04/22 2048 11/06/22 0551  Weight: 74.8 kg 70.3 kg    General appearance: Awake alert.  In no distress.  Distracted. Resp: Clear to auscultation bilaterally.  Normal effort Cardio: S1-S2 is normal regular.  No S3-S4.  No rubs murmurs or bruit GI: Abdomen is soft.  Nontender nondistended.  Bowel sounds are present normal.  No masses organomegaly Extremities: No edema.  Full range of motion of lower extremities. No obvious focal neurological deficits noted.   Lab Results:  Data Reviewed: I have personally reviewed following labs and reports of the imaging studies  CBC: Recent Labs  Lab 11/02/22 1700 11/04/22 2129 11/05/22 1100 11/06/22 0744 11/07/22 0446 11/08/22 0410 11/09/22 0421  WBC 5.6   < > 4.7 4.3 5.4 4.2 5.7  NEUTROABS 3.5  --   --   --   --   --   --  HGB 9.6*   < > 8.6* 9.2* 9.8* 8.4* 8.1*  HCT 30.1*   < > 27.3* 29.4* 30.9* 26.8* 26.0*  MCV 88.5   < > 89.2 89.1 89.0 91.2 91.5  PLT 238   < > 202 222 254 214 215   < > = values in this interval not displayed.     Basic Metabolic Panel: Recent Labs  Lab 11/02/22 1700 11/04/22 2129 11/05/22 1100 11/06/22 0744 11/07/22 0446 11/08/22 0410  NA 143 139  --  141 140 144  K 4.4 4.0  --  4.0 4.2 4.0  CL 107 104  --  105 103 109  CO2 21* 24  --  26 25 25   GLUCOSE 109* 113*  --  119* 156* 87  BUN 39* 38*  --  45* 53* 46*  CREATININE 4.61* 4.61* 4.71* 4.80* 4.71* 4.36*  CALCIUM 8.9 8.8*  --  8.7* 9.0 8.7*  MG 1.9  --   --   --   --   --      GFR: Estimated Creatinine Clearance:  11.2 mL/min (A) (by C-G formula based on SCr of 4.36 mg/dL (H)).  Liver Function Tests: Recent Labs  Lab 11/02/22 1700  AST 19  ALT 11  ALKPHOS 51  BILITOT 0.3  PROT 6.4*  ALBUMIN 3.2*       CBG: Recent Labs  Lab 11/07/22 2126 11/08/22 0747 11/08/22 1147 11/08/22 1619 11/08/22 2041  GLUCAP 94 90 184* 130* 212*       Radiology Studies: No results found.     LOS: 4 days   Andrzej Scully [employer] on [email or web address]  11/09/2022, 9:14 AM

## 2022-11-09 NOTE — Progress Notes (Signed)
Rounding Note    Patient Name: Teresa Poole Date of Encounter: 11/09/2022  [facility] Cardiologist: Larae Grooms, MD   Subjective  BP 158/62.  Denies chest pain or dyspnea.  Inpatient Medications    Scheduled Meds:  amLODipine  10 mg Oral Daily   carvedilol  6.25 mg Oral BID WC   cyanocobalamin  1,000 mcg Intramuscular Daily   Followed by   Derrill Memo ON 11/12/2022] vitamin B-12  1,000 mcg Oral Daily   docusate sodium  100 mg Oral BID   heparin  5,000 Units Subcutaneous Q8H   insulin aspart  0-15 Units Subcutaneous TID WC   insulin aspart  0-5 Units Subcutaneous QHS   iron polysaccharides  150 mg Oral Daily   isosorbide mononitrate  30 mg Oral Daily   lidocaine  15 mL Oral Once   pantoprazole  40 mg Oral Daily   QUEtiapine  25 mg Oral BID   QUEtiapine  50 mg Oral QHS   rosuvastatin  20 mg Oral Daily   Continuous Infusions:  PRN Meds: acetaminophen **OR** acetaminophen, calcium carbonate, haloperidol lactate, labetalol, methocarbamol, ondansetron **OR** ondansetron (ZOFRAN) IV, mouth rinse, oxyCODONE, polyethylene glycol   Vital Signs    Vitals:   11/08/22 0710 11/08/22 1246 11/08/22 2007 11/09/22 0610  BP:  138/63 (!) 144/65 (!) 158/62  Pulse:  76 71 70  Resp: 19 18 17 16   Temp:  98.4 F (36.9 C) 98.5 F (36.9 C) 98.1 F (36.7 C)  TempSrc:  Oral Oral Oral  SpO2:  100% 100% 100%  Weight:      Height:        Intake/Output Summary (Last 24 hours) at 11/09/2022 1210 Last data filed at 11/09/2022 1100 Gross per 24 hour  Intake 1017 ml  Output 350 ml  Net 667 ml       11/06/2022    5:51 AM 11/04/2022    8:48 PM 10/10/2022    3:49 PM  Last 3 Weights  Weight (lbs) 154 lb 15.7 oz 165 lb 165 lb 5.5 oz  Weight (kg) 70.3 kg 74.844 kg 75 kg      Telemetry    Refuses telemetry- Personally Reviewed  ECG    NSR, RBBB, Qtc 510 - Personally Reviewed  Physical Exam   GEN: No acute distress. Sitting upright in the bed   Neck: No JVD.   Cardiac: RRR, Respiratory: Clear to auscultation bilaterally. Normal WOB on room air  GI: Soft, nontender, non-distended  MS: No edema in BLE; No deformity. Neuro:  Nonfocal  Psych: Normal affect   Labs    High Sensitivity Troponin:   Recent Labs  Lab 11/02/22 1700 11/02/22 2026 11/04/22 2129 11/05/22 0429 11/05/22 2246  TROPONINIHS 68* 60* 49* 54* 39*      Chemistry Recent Labs  Lab 11/02/22 1700 11/04/22 2129 11/07/22 0446 11/08/22 0410 11/09/22 1033  NA 143   < > 140 144 142  K 4.4   < > 4.2 4.0 4.4  CL 107   < > 103 109 107  CO2 21*   < > 25 25 25   GLUCOSE 109*   < > 156* 87 215*  BUN 39*   < > 53* 46* 57*  CREATININE 4.61*   < > 4.71* 4.36* 4.78*  CALCIUM 8.9   < > 9.0 8.7* 8.7*  MG 1.9  --   --   --   --   PROT 6.4*  --   --   --   --  ALBUMIN 3.2*  --   --   --   --   AST 19  --   --   --   --   ALT 11  --   --   --   --   ALKPHOS 51  --   --   --   --   BILITOT 0.3  --   --   --   --   GFRNONAA 10*   < > 9* 10* 9*  ANIONGAP 15   < > 12 10 10    < > = values in this interval not displayed.     Lipids No results for input(s): "CHOL", "TRIG", "HDL", "LABVLDL", "LDLCALC", "CHOLHDL" in the last 168 hours.  Hematology Recent Labs  Lab 11/07/22 0446 11/08/22 0410 11/09/22 0421  WBC 5.4 4.2 5.7  RBC 3.47*  3.38* 2.94* 2.84*  HGB 9.8* 8.4* 8.1*  HCT 30.9* 26.8* 26.0*  MCV 89.0 91.2 91.5  MCH 28.2 28.6 28.5  MCHC 31.7 31.3 31.2  RDW 13.6 14.1 13.8  PLT 254 214 215    Thyroid No results for input(s): "TSH", "FREET4" in the last 168 hours.  BNPNo results for input(s): "BNP", "PROBNP" in the last 168 hours.  DDimer No results for input(s): "DDIMER" in the last 168 hours.   Radiology    No results found.  Cardiac Studies   Echocardiogram 3/15 1. Left ventricular ejection fraction, by estimation, is 45 to 50%. The  left ventricle has mildly decreased function. The left ventricle  demonstrates global hypokinesis. There is mild concentric left  ventricular  hypertrophy. Left ventricular diastolic  parameters are consistent with Grade II diastolic dysfunction  (pseudonormalization).   2. Right ventricular systolic function is normal. The right ventricular  size is normal.   3. Left atrial size was moderately dilated.   4. The mitral valve is grossly normal. Trivial mitral valve  regurgitation. No evidence of mitral stenosis.   5. The aortic valve is tricuspid. There is mild calcification of the  aortic valve. There is mild thickening of the aortic valve. Aortic valve  regurgitation is trivial. Aortic valve sclerosis/calcification is present,  without any evidence of aortic  stenosis.   6. The inferior vena cava is normal in size with greater than 50%  respiratory variability, suggesting right atrial pressure of 3 mmHg.   Carotid Duplex 11/05/22 Summary:  Right Carotid: Velocities in the right ICA are consistent with a 1-39%  stenosis.   Left Carotid: Velocities in the left ICA are consistent with a 40-59%  stenosis.               Non-hemodynamically significant plaque <50% noted in the  CCA.   Vertebrals: Bilateral vertebral arteries demonstrate antegrade flow.  Subclavians: Bilateral subclavian artery flow was disturbed.   Patient Profile     72 y.o. female with a hx of NICM, chronic combined CHF, mild nonobstructive CAD (cath 2015), anemia, CKD stage IV, HTN, HLD, GERD, carotid stenosis, L renal artery stenosis s/p stenting 2020, thyroid disease who is being seen for the evaluation of elevated troponin in the setting of HTN with medication compliance issues in the setting of signs of dementia  Assessment & Plan    Elevated troponins Chest Pain  --> in the setting of HTN emergency - High-sensitivity troponin 49>54>39.  Of note, on 3/11, hsTn was 68>60 - BP was elevated to 214/84 on arrival to the ED. Creatinine 4.61 - Patient did complain of chest pain in the ED,  but it was very atypical.  Initially described as a  stabbing/pounding sensation. Chest pain has since resolved and patient is currently chest pain free  - Suspect that chest pain is secondary to severely elevated blood pressure.  Trop elevation is likely type II MI in the setting of elevated BP, worsening kidney function - No need for IV heparin.   - Echo shows EF 45-50%, global hypokinesis   Hypertensive emergency --> Bps better controlled; continue current regimen - BP initially elevated to 214/84 in the ED.  She was given a one time dose of clonidine and her home medications were resumed. Also started amlodipine 10 mg daily 3/14 - Continue amlodipine 10 mg daily. Holding lisinopril given poor kidney function. Resumed home carvedilol at 6.25 mg BID (was taking 25 mg BID at home, dose reduced for low HR)  - Note, patient does have memory issues that have been worsening over the past year. Sister at bedside did tell Dr. Harriet Masson that the patient may not be taking her medications as prescribed. Sister willing to help make sure patient is taking her medications after she is discharged    History of nonischemic cardiomyopathy - echo 3/15: EF 45-50%, grade II DD, no signs of PHTN - Echocardiogram in 2015 showed EF 30-35%.  Patient underwent heart catheterization in 2015 that showed nonobstructive CAD with serial 20% proximal and mid LAD stenosis, 30% proximal left circumflex stenosis, 30% mid AV groove circumflex stenosis. - Echocardiogram from 03/2022 showed EF 55-60%, no regional wall motion abnormalities - Patient euvolemic on exam. CXR without pulmonary edema or pleural effusions  - Continue carvedilol. Lisinopril held for poor renal function    CKD stage IV - Creatinine elevated to 4.61 on admission. Appears that baseline is around 3.3-3.7  -Nephrology consulted, suspect prerenal/ischemic insult with hypertensive emergency in setting of RAAS blockade   History of nonobstructive CAD - Noted on heart catheterization in 2015 -Continue carvedilol,  aspirin, Crestor  Delirium -Require haldol and starting seroqeul for agitation.  Monitor Qtc, check EKG today  For questions or updates, please contact [facility] Please consult [email or web address] for contact info under        Signed, Donato Heinz, MD  11/09/2022, 12:10 PM
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
## 2022-11-09 NOTE — Plan of Care (Signed)
  Problem: Safety: Goal: Ability to remain free from injury will improve Outcome: Progressing   Problem: Coping: Goal: Level of anxiety will decrease Outcome: Progressing   Problem: Clinical Measurements: Goal: Ability to maintain clinical measurements within normal limits will improve Outcome: Progressing   Problem: Education: Goal: Knowledge of General Education information will improve Description: Including pain rating scale, medication(s)/side effects and non-pharmacologic comfort measures Outcome: Progressing

## 2022-11-09 NOTE — Progress Notes (Signed)
Patient refused to have CBG checked at 0800 this shift. Breakfast received and pt ate with no difficulty. Patient A&O, except to situation. Denies any feelings of hypoglycemia.

## 2022-11-09 NOTE — Consult Note (Signed)
Provide attempted to see and assess patient.  However patient refused to engage.  Patient did turn around and Regulatory affairs officer, however turned back around and refused to speak.  Safety sitter at bedside to report patient was irritable and disgruntled due to being held against her will.  Chart review does show patient has being followed by outpatient provider, currently being evaluated for early Alzheimer's disease.  It was recommended patient transition to assisted living or memory care facility.   Nurse practitioner added twice daily dosing of Seroquel 25 mg prn -Continue Seroquel 50 mg p.o. nightly. -EKG reviewed shows QTc of 494 corrected.  Labs ordered for vitamin B 1. Labs reviewed include vitamin B12 at 231.  CBC abnormal including hemoglobin of 8.1; patient's baseline hemoglobin has continued to fluctuate being high at 10.2 over the past 6 months.  Low anemia can contribute to worsening neurocognitive symptoms.  Suggest follow-up with hospitalist and or outpatient hemoc.   -Recommend initiating delirium precautions.   -Consider TOC consult for referral to assisted living or memory care facility.  Patient with longstanding history of memory loss and worsening neurocognitive impairment, who will benefit from long-term care.  -Psychiatry consult service will continue to follow from a distance.  Patient has been IVC by primary team as she has attempted to leave.

## 2022-11-10 DIAGNOSIS — I161 Hypertensive emergency: Secondary | ICD-10-CM | POA: Diagnosis not present

## 2022-11-10 DIAGNOSIS — R7989 Other specified abnormal findings of blood chemistry: Secondary | ICD-10-CM

## 2022-11-10 DIAGNOSIS — R0789 Other chest pain: Secondary | ICD-10-CM | POA: Diagnosis not present

## 2022-11-10 LAB — GLUCOSE, CAPILLARY
Glucose-Capillary: 174 mg/dL — ABNORMAL HIGH (ref 70–99)
Glucose-Capillary: 220 mg/dL — ABNORMAL HIGH (ref 70–99)

## 2022-11-10 LAB — CBC
HCT: 31.9 % — ABNORMAL LOW (ref 36.0–46.0)
Hemoglobin: 10 g/dL — ABNORMAL LOW (ref 12.0–15.0)
MCH: 28.6 pg (ref 26.0–34.0)
MCHC: 31.3 g/dL (ref 30.0–36.0)
MCV: 91.1 fL (ref 80.0–100.0)
Platelets: 252 10*3/uL (ref 150–400)
RBC: 3.5 MIL/uL — ABNORMAL LOW (ref 3.87–5.11)
RDW: 13.8 % (ref 11.5–15.5)
WBC: 7.3 10*3/uL (ref 4.0–10.5)
nRBC: 0 % (ref 0.0–0.2)

## 2022-11-10 LAB — BASIC METABOLIC PANEL
Anion gap: 12 (ref 5–15)
BUN: 59 mg/dL — ABNORMAL HIGH (ref 8–23)
CO2: 23 mmol/L (ref 22–32)
Calcium: 9.1 mg/dL (ref 8.9–10.3)
Chloride: 105 mmol/L (ref 98–111)
Creatinine, Ser: 4.63 mg/dL — ABNORMAL HIGH (ref 0.44–1.00)
GFR, Estimated: 10 mL/min — ABNORMAL LOW (ref >60)
Glucose, Bld: 190 mg/dL — ABNORMAL HIGH (ref 70–99)
Potassium: 4.4 mmol/L (ref 3.5–5.1)
Sodium: 140 mmol/L (ref 135–145)

## 2022-11-10 MED ORDER — CARVEDILOL 12.5 MG PO TABS
12.5000 mg | ORAL_TABLET | Freq: Two times a day (BID) | ORAL | Status: DC
  Administered 2022-11-11 – 2022-11-13 (×5): 12.5 mg via ORAL
  Filled 2022-11-10 (×6): qty 1

## 2022-11-10 NOTE — Consult Note (Cosign Needed Addendum)
Psychiatry continues to follow this patient from a distance. Received a message from Dr. Maryland Pink that patients POA (sister) would like to receive a phone call from psychiatry.   Writer contacted sister at phone number listed. Caller identified that sister is Hassell Done at [phone]. Sister inquired about psychiatry's current plan with patient and disposition. We did review several things:   Major neurocognitive disorder is evidenced by profound cognitive decline in one or more areas including, but not limited to, memory, attention, language, and executive function. Cognitive decline should be supported by clinical assessments and neuropsychological testing, and the decline should represent impairment in one's ability to perform activities of daily living. Memory impairment is a prominent early symptom and is required to make a diagnosis of dementia.   Behavioral Disturbances While dementia is well recognized as a disorder that progressively deteriorates, the associated behavioral disturbances may unnecessarily hasten a patient's loss of quality of life as well as lead to inpatient hospitalization and institutionalization (placement in a long-term care facility).  However while patient did initially present with altered mental status, remaining in the hospital/inpatient will place her at increased risk for worsening behavioral disturbances and developing delirium.  These behavioral manifestations in dementia include agitation, aggression, psychosis, and purposeless wandering. Delusions and disruptive behaviors including aggression and screaming are reported to be among the most disturbing to caregivers. For patients experiencing early signs of dementia, mood changes and increased anxiety may actually be more prominent than memory loss, so healthcare providers and family members should be alerted to report and document such changes.  We are currently managing the above behaviors with prn antipsychotics.   Also reviewed current recommendations for assisted living facility of memory care.  Further discussion was held about current power of attorney role, and consideration of legal guardianship.  Sister did report patient has previously declined going to a facility, however agreed she is in no position to take care of her sister therefore a facility will be required.   -Continue delirium precautions. -Limit use of benzodiazepines and antihistamines to prevent further disinhibition of patient. -Continue Seroquel 25 mg p.o. twice daily as needed, and Seroquel 50 mg p.o. nightly scheduled for management of agitation, irritability, and aggression. -Continue with Air cabin crew.  Due to patient's lack of judgment and insight, worsening cognitive impairment and therefore remains a danger to herself therefore she meets criteria for involuntary commitment.  Patient with QTc of 494 corrected [place].  It is important and we limit use of antipsychotics at this time. -Continue working with [facility] to secure placement to assisted living facility with eligibility for memory care. -Further workup for dementia/Alzheimer's disease should be conducted in an outpatient environment.  That is not a service that we offer in an inpatient setting.  Psychiatry consult service will continue to follow from a distance.  I personally spent 10 minutes on the unit in direct patient care. The direct patient care time included reviewing the patient's chart, communicating with other professionals, and coordinating care.  Additional time 15 minutes was spent with her power of attorney discussing health care and proper disposition.  Greater than 50% of this time was spent in counseling or coordinating care with the patient regarding goals of hospitalization, psycho-education, and discharge planning needs
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
## 2022-11-10 NOTE — Progress Notes (Signed)
Rounding Note    Patient Name: Teresa Poole Date of Encounter: 11/10/2022  [place] Cardiologist: Larae Grooms, MD   Subjective  BP 178/89.  Cr 4.63. Denies chest pain or dyspnea.  Inpatient Medications    Scheduled Meds:  amLODipine  10 mg Oral Daily   aspirin EC  81 mg Oral Daily   carvedilol  6.25 mg Oral BID WC   cyanocobalamin  1,000 mcg Intramuscular Daily   Followed by   Derrill Memo ON 11/12/2022] vitamin B-12  1,000 mcg Oral Daily   docusate sodium  100 mg Oral BID   heparin  5,000 Units Subcutaneous Q8H   insulin aspart  0-15 Units Subcutaneous TID WC   insulin aspart  0-5 Units Subcutaneous QHS   iron polysaccharides  150 mg Oral Daily   isosorbide mononitrate  30 mg Oral Daily   lidocaine  15 mL Oral Once   pantoprazole  40 mg Oral Daily   QUEtiapine  50 mg Oral QHS   rosuvastatin  20 mg Oral Daily   thiamine  100 mg Oral Daily   Continuous Infusions:  PRN Meds: acetaminophen **OR** acetaminophen, calcium carbonate, haloperidol lactate, labetalol, methocarbamol, ondansetron **OR** ondansetron (ZOFRAN) IV, mouth rinse, oxyCODONE, polyethylene glycol, QUEtiapine   Vital Signs    Vitals:   11/09/22 1251 11/09/22 2029 11/10/22 0526 11/10/22 0800  BP: (!) 158/74 (!) 164/82 (!) 182/77 (!) 178/79  Pulse: 91 79 77 75  Resp: 18 20 20    Temp: 98.7 F (37.1 C) (!) 97.5 F (36.4 C) 98.1 F (36.7 C)   TempSrc: Oral Oral Oral   SpO2: 100% 100% 100%   Weight:      Height:        Intake/Output Summary (Last 24 hours) at 11/10/2022 1006 Last data filed at 11/10/2022 0800 Gross per 24 hour  Intake 720 ml  Output 550 ml  Net 170 ml       11/06/2022    5:51 AM 11/04/2022    8:48 PM 10/10/2022    3:49 PM  Last 3 Weights  Weight (lbs) 154 lb 15.7 oz 165 lb 165 lb 5.5 oz  Weight (kg) 70.3 kg 74.844 kg 75 kg      Telemetry    Refuses telemetry- Personally Reviewed  ECG    NSR, RBBB, left anterior fascicular block, LVH, QTc 522  -  Personally Reviewed  Physical Exam   GEN: No acute distress. Sitting upright in the bed   Neck: No JVD.  Cardiac: RRR, Respiratory: Clear to auscultation bilaterally. Normal WOB on room air  GI: Soft, nontender, non-distended  MS: No edema in BLE; No deformity. Neuro:  Nonfocal  Psych: Normal affect   Labs    High Sensitivity Troponin:   Recent Labs  Lab 11/02/22 1700 11/02/22 2026 11/04/22 2129 11/05/22 0429 11/05/22 2246  TROPONINIHS 68* 60* 49* 54* 39*      Chemistry Recent Labs  Lab 11/08/22 0410 11/09/22 1033 11/10/22 0807  NA 144 142 140  K 4.0 4.4 4.4  CL 109 107 105  CO2 25 25 23   GLUCOSE 87 215* 190*  BUN 46* 57* 59*  CREATININE 4.36* 4.78* 4.63*  CALCIUM 8.7* 8.7* 9.1  GFRNONAA 10* 9* 10*  ANIONGAP 10 10 12      Lipids No results for input(s): "CHOL", "TRIG", "HDL", "LABVLDL", "LDLCALC", "CHOLHDL" in the last 168 hours.  Hematology Recent Labs  Lab 11/08/22 0410 11/09/22 0421 11/10/22 0807  WBC 4.2 5.7 7.3  RBC 2.94*  2.84* 3.50*  HGB 8.4* 8.1* 10.0*  HCT 26.8* 26.0* 31.9*  MCV 91.2 91.5 91.1  MCH 28.6 28.5 28.6  MCHC 31.3 31.2 31.3  RDW 14.1 13.8 13.8  PLT 214 215 252    Thyroid No results for input(s): "TSH", "FREET4" in the last 168 hours.  BNPNo results for input(s): "BNP", "PROBNP" in the last 168 hours.  DDimer No results for input(s): "DDIMER" in the last 168 hours.   Radiology    No results found.  Cardiac Studies   Echocardiogram 3/15 1. Left ventricular ejection fraction, by estimation, is 45 to 50%. The  left ventricle has mildly decreased function. The left ventricle  demonstrates global hypokinesis. There is mild concentric left ventricular  hypertrophy. Left ventricular diastolic  parameters are consistent with Grade II diastolic dysfunction  (pseudonormalization).   2. Right ventricular systolic function is normal. The right ventricular  size is normal.   3. Left atrial size was moderately dilated.   4. The  mitral valve is grossly normal. Trivial mitral valve  regurgitation. No evidence of mitral stenosis.   5. The aortic valve is tricuspid. There is mild calcification of the  aortic valve. There is mild thickening of the aortic valve. Aortic valve  regurgitation is trivial. Aortic valve sclerosis/calcification is present,  without any evidence of aortic  stenosis.   6. The inferior vena cava is normal in size with greater than 50%  respiratory variability, suggesting right atrial pressure of 3 mmHg.   Carotid Duplex 11/05/22 Summary:  Right Carotid: Velocities in the right ICA are consistent with a 1-39%  stenosis.   Left Carotid: Velocities in the left ICA are consistent with a 40-59%  stenosis.               Non-hemodynamically significant plaque <50% noted in the  CCA.   Vertebrals: Bilateral vertebral arteries demonstrate antegrade flow.  Subclavians: Bilateral subclavian artery flow was disturbed.   Patient Profile     72 y.o. female with a hx of NICM, chronic combined CHF, mild nonobstructive CAD (cath 2015), anemia, CKD stage IV, HTN, HLD, GERD, carotid stenosis, L renal artery stenosis s/p stenting 2020, thyroid disease who is being seen for the evaluation of elevated troponin in the setting of HTN with medication compliance issues in the setting of signs of dementia  Assessment & Plan    Elevated troponins Chest Pain  --> in the setting of HTN emergency - High-sensitivity troponin 49>54>39.  Of note, on 3/11, hsTn was 68>60 - BP was elevated to 214/84 on arrival to the ED. Creatinine 4.61 - Patient did complain of chest pain in the ED, but it was very atypical.  Initially described as a stabbing/pounding sensation. Chest pain has since resolved and patient is currently chest pain free  - Suspect that chest pain is secondary to severely elevated blood pressure.  Trop elevation is likely type II MI in the setting of elevated BP, worsening kidney function - No need for IV  heparin.   - Echo shows EF 45-50%, global hypokinesis   Hypertensive emergency - BP initially elevated to 214/84 in the ED.  She was given a one time dose of clonidine and her home medications were resumed. Also started amlodipine 10 mg daily 3/14 - Continue amlodipine 10 mg daily. Holding lisinopril given poor kidney function. Resumed home carvedilol at 6.25 mg BID (was taking 25 mg BID at home).  Will increase carvedilol to 12.5 mg twice daily given elevated pressures - Note, patient  does have memory issues that have been worsening over the past year. Sister at bedside did tell Dr. Harriet Masson that the patient may not be taking her medications as prescribed. Sister willing to help make sure patient is taking her medications after she is discharged    History of nonischemic cardiomyopathy - echo 3/15: EF 45-50%, grade II DD, no signs of PHTN - Echocardiogram in 2015 showed EF 30-35%.  Patient underwent heart catheterization in 2015 that showed nonobstructive CAD with serial 20% proximal and mid LAD stenosis, 30% proximal left circumflex stenosis, 30% mid AV groove circumflex stenosis. - Echocardiogram from 03/2022 showed EF 55-60%, no regional wall motion abnormalities - Patient euvolemic on exam. CXR without pulmonary edema or pleural effusions  - Continue carvedilol. Lisinopril held for poor renal function    CKD stage IV - Creatinine elevated to 4.61 on admission. Appears that baseline is around 3.3-3.7  -Nephrology consulted, suspect prerenal/ischemic insult with hypertensive emergency in setting of RAAS blockade   History of nonobstructive CAD - Noted on heart catheterization in 2015 -Continue carvedilol, aspirin, Crestor  Delirium -Required haldol and starting seroqeul for agitation.  Qtc 522 on EKG yesterday, would avoid QT prolonging agents  For questions or updates, please contact [place] Please consult [email or web address] for contact info under        Signed, Donato Heinz, MD  11/10/2022, 10:06 AM

## 2022-11-10 NOTE — TOC Progression Note (Signed)
Transition of Care [facility]) - Progression Note    Patient Details  Name: Teresa Poole MRN: [id number] Date of Birth: 1951-01-24  Transition [facility] Care [facility]) CM/SW [place], LCSW Phone Number: 11/10/2022, 1:30 PM  Clinical Narrative:    CSW spoke with pt's sister regarding pt, she reports pt lives alone. Per pt's sister pt has had trouble with keeping her home clean. She reports she has asked pt to live with her but pt refuses. She reports pt refuses a lot of things. She reports concerns with pt's memory. She reports pt does not have a dementia diagnosis. She reports arranging doctor appointments for pt but the pt would cancel them. Pt's sister stated she is pt's POA/HCPOA. CSW explained pt was recommended for memory care, however, pt does not qualify for this as she does not have dementia dx. Per pt's sister, she has tried to speak with pt about ALF placement, however, pt refuses. Pt's sister stated pt does not want to lose her independence. Pt's sister reports is working on getting someone to help pt in the home. CSW explained private duty and how this is an [place] received permission to send a referral to [place] with [facility]. CSW will follow up with pt's sister after home care is arranged. TOC to follow    Expected Discharge Plan:  (TBD) Barriers to Discharge: Continued Medical Work up  Expected Discharge Plan and Services   Discharge Planning Services: CM Consult   Living arrangements for the past 2 months: Apartment                                       Social Determinants of Health (SDOH) Interventions SDOH Screenings   Food Insecurity: No Food Insecurity (11/08/2022)  Housing: Low Risk  (11/08/2022)  Transportation Needs: No Transportation Needs (11/08/2022)  Utilities: Not At Risk (11/08/2022)  Tobacco Use: Low Risk  (11/05/2022)    Readmission Risk Interventions    10/28/2021    2:56 PM  Readmission Risk Prevention Plan   Transportation Screening Complete  Medication Review ([place]) Complete  PCP or Specialist appointment within 3-5 days of discharge Complete  HRI or [place] Complete  SW Recovery Care/Counseling Consult Complete  [place] Not Applicable

## 2022-11-11 DIAGNOSIS — I5021 Acute systolic (congestive) heart failure: Secondary | ICD-10-CM

## 2022-11-11 DIAGNOSIS — I161 Hypertensive emergency: Secondary | ICD-10-CM | POA: Diagnosis not present

## 2022-11-11 LAB — GLUCOSE, CAPILLARY
Glucose-Capillary: 118 mg/dL — ABNORMAL HIGH (ref 70–99)
Glucose-Capillary: 166 mg/dL — ABNORMAL HIGH (ref 70–99)
Glucose-Capillary: 231 mg/dL — ABNORMAL HIGH (ref 70–99)
Glucose-Capillary: 188 mg/dL — ABNORMAL HIGH (ref 70–99)

## 2022-11-11 MED ORDER — HALOPERIDOL LACTATE 5 MG/ML IJ SOLN
0.5000 mg | Freq: Four times a day (QID) | INTRAMUSCULAR | Status: DC | PRN
  Filled 2022-11-11: qty 1

## 2022-11-11 MED ORDER — HALOPERIDOL LACTATE 5 MG/ML IJ SOLN
0.5000 mg | Freq: Once | INTRAMUSCULAR | Status: AC | PRN
  Administered 2022-11-11: 0.5 mg via INTRAVENOUS
  Filled 2022-11-11: qty 1

## 2022-11-11 NOTE — Plan of Care (Signed)

## 2022-11-11 NOTE — Progress Notes (Signed)
Mobility Specialist - Progress Note   11/11/22 1345  Mobility  Activity Ambulated independently in hallway  Level of Assistance Independent  Assistive Device None  Distance Ambulated (ft) 350 ft  Activity Response Tolerated well  Mobility Referral Yes  $Mobility charge 1 Mobility   Pt received in bed and agreeable to mobility. No complaints during session. Pt was very thankful for the walk. Pt to bed after session with all needs met. Sitter in room.  [facility]

## 2022-11-11 NOTE — TOC Progression Note (Signed)
Transition of Care [facility]) - Progression Note    Patient Details  Name: Teresa Poole MRN: [id number] Date of Birth: 1951/02/08  Transition of Care [facility]) CM/SW [place], LCSW Phone Number: 11/11/2022, 3:47 PM  Clinical Narrative:    CSW spoke with pt sister to receive an update on pt's finances and if she spoke with home care agencies. Pt's sister stated she has not contacted anyone yet as she had a dentist appointment today. CSW inquired if she had spoken with [facility], and she stated no one had contacted her. pt's sister stated she will follow up with CSW when she knows more. TOC to follow.     Expected Discharge Plan:  (TBD) Barriers to Discharge: Continued Medical Work up  Expected Discharge Plan and Services   Discharge Planning Services: CM Consult   Living arrangements for the past 2 months: Apartment                                       Social Determinants of Health (SDOH) Interventions SDOH Screenings   Food Insecurity: No Food Insecurity (11/08/2022)  Housing: Low Risk  (11/08/2022)  Transportation Needs: No Transportation Needs (11/08/2022)  Utilities: Not At Risk (11/08/2022)  Tobacco Use: Low Risk  (11/05/2022)    Readmission Risk Interventions    10/28/2021    2:56 PM  Readmission Risk Prevention Plan  Transportation Screening Complete  Medication Review ([place]) Complete  PCP or Specialist appointment within 3-5 days of discharge Complete  HRI or [place] Complete  SW Recovery Care/Counseling Consult Complete  [place] Not Applicable

## 2022-11-11 NOTE — Progress Notes (Signed)
[facility] PROGRESS NOTE   Teresa Poole [id number] DOB: Jan 10, 1951 DOA: 11/04/2022  PCP: [name], MD  Brief History/Interval Summary: 72 y.o. female with medical history significant for obesity, combined chronic systolic and diastolic congestive heart failure, CKD stage IV baseline creatinine about 3, history of renal artery stenosis status post stent about 5 years ago, admitted to the hospital with uncontrolled hypertension and atypical chest pain.  Noted to have systolic blood pressures greater than 200.  There is some concern about noncompliance although patient is certain she takes all of her medications.  Apparently there have been some memory issues recently.  Patient was hospitalized for further management.    Consultants: Cardiology.  Nephrology   Subjective/Interval History: She is alert and conversant, more calm than yesterday.     Assessment/Plan:  1-Hypertensive emergency -Likely reason for her chest discomfort.  Systolic blood pressure was greater than 200.  Patient was given her antihypertensives with improvement in blood pressure.  Cardiology was consulted. -Continue with  amlodipine, carvedilol and nitrates.  ACE inhibitor is currently on hold due to worsening creatinine. -Renal ultrasound was done including renal vascular studies which showed some degree of left renal artery stenosis but not significant enough to cause her elevated blood pressures. -Carotid Dopplers did show moderate disease.  But she is asymptomatic.  Outpatient management. -Compliance is an issue.   2-Acute agitation/delirium/suicidal ideation/history of memory impairment. -Patient has underlying cognitive impairment as evidenced by history of memory impairment mentioned by her sister.   -She was quite agitated overnight on 3/15.  She was given Ativan without any effect.  -She was restrained.  She was given haloperidol and started on Seroquel.  Due to suicidal ideation  psychiatry was consulted.  Continue with sitter. -EKG reviewed on 3/17.  Even though the computer is calling it prolonged QT upon my calculation QT interval corrected for heart rate is 458 ms. -CT head did not show any acute findings.  TSH was normal when checked in February. -Vitamin B12 level noted to be borderline low at 231.  -Needs out patient referral to neurology.  -She lives by herself and in her current state is unsafe for discharge.   -She was involuntarily committed on 11/09/2022 based on recommendations of the psychiatrist due to agitation and suicidal ideation. Psych continue to recommend IVC, needs ALF memory care unit.   3-Chest pain Atypical.  Likely secondary to elevated blood pressure.  Troponin trends have been flat. Echocardiogram shows diminished left ventricular EF of 45 to 50%.  No further episodes of chest pain noted.  Cardiology does not plan any further workup at this time.  Acute on chronic kidney disease stage IV Baseline creatinine is around 3.7.  Presented with a higher than baseline creatinine.  Followed by Dr[facility] Joelyn Oms with [facility].  Nephrology was consulted.   Creatinine has stabilized.  She appears to have a new baseline now. Monitor urine output.  Renal ultrasound does not show any hydronephrosis.  Lisinopril and furosemide on hold currently. Nephrology has signed off.  History of cardiomyopathy Echocardiogram from this admission shows left ventricular EF of 45 to 50%.  Echocardiogram from August 2023 showed EF to be 55 to 60%.  Back in 2015 she had a EF of 30 to 35%. Seems to be fairly euvolemic currently.  Furosemide and lisinopril on hold due to elevated creatinine.  History of coronary artery disease This is nonobstructive based on cardiac catheterization in 2015.  Renal artery stenosis Underwent stent placement  in February 2020.  1-59% stenosis of the left renal artery noted on renal vascular study.  Not significant enough to merit  any urgent intervention.   Continue statin.  Start aspirin at discharge.  Normocytic anemia Likely anemia of chronic disease.  No evidence of overt bleeding.  Drop in hemoglobin possibly dilutional.  Patient is on iron supplements.  Patient was given Aranesp on 3/17.  Vitamin B12 deficiency Vitamin B12 noted to be low at 231.  Started on supplementation.   DVT Prophylaxis: Subcutaneous heparin Code Status: Full code Family Communication: Sister being updated periodically. Disposition Plan: May need to go to memory care unit/assisted living facility.    Status is: Inpatient Remains inpatient appropriate because: Hypertensive emergency, acute kidney injury      Medications: Scheduled:  amLODipine  10 mg Oral Daily   aspirin EC  81 mg Oral Daily   carvedilol  12.5 mg Oral BID WC   [START ON 11/12/2022] vitamin B-12  1,000 mcg Oral Daily   docusate sodium  100 mg Oral BID   heparin  5,000 Units Subcutaneous Q8H   insulin aspart  0-15 Units Subcutaneous TID WC   insulin aspart  0-5 Units Subcutaneous QHS   iron polysaccharides  150 mg Oral Daily   isosorbide mononitrate  30 mg Oral Daily   lidocaine  15 mL Oral Once   pantoprazole  40 mg Oral Daily   QUEtiapine  50 mg Oral QHS   rosuvastatin  20 mg Oral Daily   thiamine  100 mg Oral Daily   Continuous: [id number] **OR** acetaminophen, calcium carbonate, labetalol, methocarbamol, ondansetron **OR** ondansetron (ZOFRAN) IV, mouth rinse, oxyCODONE, polyethylene glycol, QUEtiapine  Antibiotics: Anti-infectives (From admission, onward)    None       Objective:  Vital Signs  Vitals:   11/10/22 1341 11/10/22 2101 11/11/22 0610 11/11/22 1441  BP:  116/69 (!) 155/71 (!) 140/65  Pulse:  81 77 73  Resp: 17 18 16 16   Temp:  98.2 F (36.8 C) 98 F (36.7 C) 98.4 F (36.9 C)  TempSrc:  Oral Oral Oral  SpO2:  100% 99% 99%  Weight:      Height:        Intake/Output Summary (Last 24 hours) at 11/11/2022 1545 Last  data filed at 11/11/2022 1353 Gross per 24 hour  Intake 825 ml  Output 550 ml  Net 275 ml    Filed Weights   11/04/22 2048 11/06/22 0551  Weight: 74.8 kg 70.3 kg   General appearance: Alert, answer questions.  CVS; S 1, S 2 RRR Lungs CTA   Lab Results:  Data Reviewed: I have personally reviewed following labs and reports of the imaging studies  CBC: Recent Labs  Lab 11/06/22 0744 11/07/22 0446 11/08/22 0410 11/09/22 0421 11/10/22 0807  WBC 4.3 5.4 4.2 5.7 7.3  HGB 9.2* 9.8* 8.4* 8.1* 10.0*  HCT 29.4* 30.9* 26.8* 26.0* 31.9*  MCV 89.1 89.0 91.2 91.5 91.1  PLT 222 254 214 215 252     Basic Metabolic Panel: Recent Labs  Lab 11/06/22 0744 11/07/22 0446 11/08/22 0410 11/09/22 1033 11/10/22 0807  NA 141 140 144 142 140  K 4.0 4.2 4.0 4.4 4.4  CL 105 103 109 107 105  CO2 26 25 25 25 23   GLUCOSE 119* 156* 87 215* 190*  BUN 45* 53* 46* 57* 59*  CREATININE 4.80* 4.71* 4.36* 4.78* 4.63*  CALCIUM 8.7* 9.0 8.7* 8.7* 9.1     GFR: Estimated Creatinine Clearance: 10.6  mL/min (A) (by C-G formula based on SCr of 4.63 mg/dL (H)).    CBG: Recent Labs  Lab 11/09/22 2100 11/10/22 0755 11/10/22 2059 11/11/22 0734 11/11/22 1131  GLUCAP 99 174* 220* 118* 166*       Radiology Studies: No results found.     LOS: 6 days   Balbina Depace A Keneth Borg  [facility] [employer] operational officer on [email or web address]  11/11/2022, 3:45 PM

## 2022-11-11 NOTE — Consult Note (Signed)
[facility] Psychiatry Consult   Reason for Consult:  Agitation Referring Physician:  Dr. [place] Pink Patient Identification: Teresa Poole MRN:  [id number] Principal Diagnosis: Atypical chest pain Diagnosis:  Principal Problem:   Atypical chest pain Active Problems:   Hypertension associated with diabetes ([place])   Type 2 diabetes mellitus with chronic kidney disease, without long-term current use of insulin (HCC)   Acute systolic heart failure ([place])   Hypertensive urgency   Cardiomyopathy, hypertensive ([place])   Chronic kidney disease (CKD), stage IV (severe) (HCC)   Chronic systolic heart failure (HCC)   Hyperlipidemia associated with type 2 diabetes mellitus (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 female patient admitted with Atypical chest pain.  HPI:   On Interview 11/11/2022 Patient seen sitting in bed this afternoon accompanied by the sitter she is agreeable to psychiatric evaluation. The patient is unable to identify the reasons for admission. The patient was open to completing a [place] and did show willingness to participate. Pateint scored 22/30 on MOCA which is consistent with mild cognitive impairment. After discussing these results with patient, she continued to lack some insight into safety concerns. SHe is adamant that she is able to complete her ADLs and IADLs independently and is comfortable discharging home. She denies home health services and denies medications, again lacking insight.   She has not demonstrated any disruptive behaviors, aggression, agitation since being admitted to the floor.  She has continued to focus  primarily on discharging home.  She continues to deny any previous psychiatric history, psychotropic medication, and or suicidal attempts.  She has not taken any psychotropic medication during this hospitalization, however has received multiple as needed medications to manage agitation.  She denies any eating or sleeping disturbances.    MoCA score is a 22 out of 30.  She had difficulty with visual-spatial executive function with trail making, had difficulty with memory and delayed recall, orientation, drawing of a clock.  Surprisingly she was able to complete serial 7 subtractions, recall words, identify greater than 11 words starting with the letter F.  Past Psychiatric History: Denies but per sister the patient was prescribed Zyprexa  Collateral: Teresa Poole Sister [phone] She reports that she has been concerned about the patient's behavior for years since her father died. She reports that the patient has not been taking care of herself from a medical standpoint and she has not been taking care of her home and it is constantly in [place]. She has attempted to get the patient's home cleaned and help her with her medical appointments but the patient just gets frustrated with her. She does not think that the patient is a danger of harming herself but she does feel that the patient needs further mental health treatment.  Past Medical History:  Past Medical History:  Diagnosis Date   Allergy    Anemia    Carotid stenosis    Chronic combined systolic and diastolic CHF (congestive heart failure) (HCC)    CKD (chronic kidney disease), stage IV ([place]) 09/24/2013   Pt at [facility], Dr. Joelyn Oms   COVID-19 10/24/2021   Diabetes mellitus type 2 in  nonobese ([place])    Edema 09/14/2013   GERD (gastroesophageal reflux disease)    History of ovarian cancer    Hyperlipidemia    Hypertension    Mild CAD    non-obstructive by LHC (09/25/2013): Proximal and mid LAD serial 20%,  proximal circumflex 30%, mid AV groove circumflex 30%, mid RCA mild plaque.   NICM (nonischemic cardiomyopathy) (HCC)    Obesity (BMI 30-39.9)    RBBB    Renal artery stenosis ([place]) 10/11/2018   Thyroid disease    Seen by specialist    Past Surgical History:  Procedure Laterality Date   LEFT HEART CATHETERIZATION WITH CORONARY ANGIOGRAM N/A 09/25/2013   Procedure: LEFT HEART CATHETERIZATION WITH CORONARY ANGIOGRAM;  Surgeon: Burnell Blanks, MD;  Location: [facility] CATH LAB;  Service: Cardiovascular;  Laterality: N/A;   RENAL ANGIOGRAPHY N/A 10/06/2018   Procedure: RENAL ANGIOGRAPHY;  Surgeon: Marty Heck, MD;  Location: [facility] CV LAB;  Service: Cardiovascular;  Laterality: N/A;   Family Psychiatric  History: Denies Social History:  Social History   Substance and Sexual Activity  Alcohol Use No     Social History   Substance and Sexual Activity  Drug Use No    Social History   Socioeconomic History   Marital status: Single    Spouse name: Not on file   Number of children: 0   Years of education: Not on file   Highest education level: Not on file  Occupational History    Employer: [employer]  Tobacco Use   Smoking status: Never   Smokeless tobacco: Never  Vaping Use   Vaping Use: Never used  Substance and Sexual Activity   Alcohol use: No   Drug use: No   Sexual activity: Not on file  Other Topics Concern   Not on file  Social History Narrative   Works at [employer]   Patient lives at home alone.    Patient has no children.    Patient patient is right handed.    Patient is single.    Social Determinants of Health   Financial Resource Strain: Not on file  Food Insecurity: No Food Insecurity (11/08/2022)   Hunger Vital Sign    Worried About Running Out of Food in the Last Year: Never true    Ran Out of Food in the Last Year: Never true  Transportation Needs: No Transportation Needs (11/08/2022)   PRAPARE - Radiographer, therapeutic (Medical): No    Lack of Transportation (Non-Medical): No  Physical Activity: Not on file  Stress: Not on file  Social Connections: Not on file   Additional Social History:    Allergies:   Allergies  Allergen Reactions   Hydralazine Hcl Itching    ENTIRE BODY = burning sensation, also in the eyes (they have become very sensitive to light)   Atorvastatin Other (See Comments)    Per MD - pt not sure of reaction     Labs:  Results for orders placed or performed during the hospital encounter of 11/04/22 (from the past 48 hour(s))  Glucose, capillary     Status: Abnormal   Collection Time: 11/09/22  6:02 PM  Result Value Ref Range   Glucose-Capillary 195 (H) 70 - 99 mg/dL    Comment: Glucose reference range applies only to samples taken after fasting for at least 8 hours.  Glucose, capillary     Status: None   Collection Time: 11/09/22  9:00 PM  Result Value Ref Range   Glucose-Capillary 99 70 - 99 mg/dL    Comment: Glucose reference range applies only to samples taken after fasting for at least 8 hours.  Glucose, capillary     Status: Abnormal   Collection Time: 11/10/22  7:55 AM  Result Value Ref Range   Glucose-Capillary 174 (H) 70 - 99 mg/dL    Comment: Glucose reference range applies only to samples taken after fasting for at least 8 hours.  CBC     Status: Abnormal   Collection Time: 11/10/22  8:07 AM  Result Value Ref Range   WBC 7.3 4.0 - 10.5 K/uL   RBC 3.50 (L) 3.87 - 5.11 MIL/uL   Hemoglobin 10.0 (L) 12.0 - 15.0 g/dL   HCT 31.9 (L) 36.0 - 46.0 %   MCV 91.1 80.0 - 100.0 fL   MCH 28.6 26.0 - 34.0 pg   MCHC 31.3 30.0 - 36.0 g/dL   RDW 13.8 11.5 - 15.5 %   Platelets 252 150 - 400 K/uL   nRBC 0.0 0.0 - 0.2 %    Comment: Performed at [facility], [place] [address], [place], [place] [id number]  Basic metabolic panel     Status: Abnormal   Collection Time: 11/10/22  8:07 AM  Result Value Ref Range   Sodium 140 135 - 145 mmol/L    Potassium 4.4 3.5 - 5.1 mmol/L   Chloride 105 98 - 111 mmol/L   CO2 23 22 - 32 mmol/L   Glucose, Bld 190 (H) 70 - 99 mg/dL    Comment: Glucose reference range applies only to samples taken after fasting for at least 8 hours.   BUN 59 (H) 8 - 23 mg/dL   Creatinine, Ser 4.63 (H) 0.44 - 1.00 mg/dL   Calcium 9.1 8.9 - 10.3 mg/dL   GFR, Estimated 10 (L) >60 mL/min    Comment: (NOTE) Calculated using the CKD-EPI Creatinine Equation (2021)    Anion gap 12 5 - 15    Comment: Performed at [facility], [place] [address], [place], [place] [address]  Glucose, capillary     Status: Abnormal   Collection Time: 11/10/22  8:59 PM  Result Value Ref Range   Glucose-Capillary 220 (H) 70 - 99 mg/dL    Comment: Glucose reference range applies only to samples taken after fasting for at least 8 hours.   Comment 1 Notify RN    Comment 2 Document in Chart   Glucose, capillary     Status: Abnormal   Collection Time: 11/11/22  7:34 AM  Result Value Ref Range   Glucose-Capillary 118 (H) 70 - 99 mg/dL    Comment: Glucose reference range applies only to samples taken after fasting for at least 8 hours.  Glucose, capillary     Status: Abnormal   Collection Time: 11/11/22 11:31 AM  Result Value Ref Range   Glucose-Capillary 166 (H) 70 - 99 mg/dL    Comment: Glucose reference range applies only to samples taken after fasting for at least 8 hours.    Current Facility-Administered Medications  Medication Dose Route Frequency Provider Last Rate Last Admin   acetaminophen (TYLENOL) tablet 650 mg  650 mg Oral Q6H PRN Hollice Gong, Mir M, MD   650 mg at 11/10/22 0344   Or   acetaminophen (TYLENOL) suppository 650 mg  650 mg Rectal Q6H PRN Hollice Gong, Mir M, MD       amLODipine (NORVASC) tablet 10 mg  10 mg Oral Daily  Tobb, Kardie, DO   10 mg at 11/11/22 [id number]   aspirin EC tablet 81 mg  81 mg Oral Daily Donato Heinz, MD   81 mg at 11/11/22 1150   calcium carbonate (TUMS - dosed in mg  elemental calcium) chewable tablet 400 mg of elemental calcium  400 mg of elemental calcium Oral Q6H PRN Raenette Rover, NP   400 mg of elemental calcium at 11/07/22 0341   carvedilol (COREG) tablet 12.5 mg  12.5 mg Oral BID WC Donato Heinz, MD   12.5 mg at 11/11/22 0917   [START ON 11/12/2022] cyanocobalamin (VITAMIN B12) tablet 1,000 mcg  1,000 mcg Oral Daily Bonnielee Haff, MD       docusate sodium (COLACE) capsule 100 mg  100 mg Oral BID Hollice Gong, Mir M, MD   100 mg at 11/11/22 0917   heparin injection 5,000 Units  5,000 Units Subcutaneous Q8H Hollice Gong, Mir M, MD   5,000 Units at 11/10/22 1303   insulin aspart (novoLOG) injection 0-15 Units  0-15 Units Subcutaneous TID WC Lucillie Garfinkel, MD   3 Units at 11/09/22 1803   insulin aspart (novoLOG) injection 0-5 Units  0-5 Units Subcutaneous QHS Lucillie Garfinkel, MD   5 Units at 11/08/22 2157   iron polysaccharides (NIFEREX) capsule 150 mg  150 mg Oral Daily Claudia Desanctis, MD   150 mg at 11/11/22 [id number]   isosorbide mononitrate (IMDUR) 24 hr tablet 30 mg  30 mg Oral Daily Claudia Desanctis, MD   30 mg at 11/11/22 0917   labetalol (NORMODYNE) injection 10 mg  10 mg Intravenous Q2H PRN Hollice Gong, Mir M, MD   10 mg at 11/07/22 1018   lidocaine (XYLOCAINE) 2 % viscous mouth solution 15 mL  15 mL Oral Once Raenette Rover, NP       methocarbamol (ROBAXIN) tablet 750 mg  750 mg Oral Q6H PRN Hollice Gong, Mir M, MD   750 mg at 11/07/22 1014   ondansetron (ZOFRAN) tablet 4 mg  4 mg Oral Q6H PRN Hollice Gong, Mir M, MD       Or   ondansetron [facility]) injection 4 mg  4 mg Intravenous Q6H PRN Lucillie Garfinkel, MD       Oral care mouth rinse  15 mL Mouth Rinse PRN Bonnielee Haff, MD       oxyCODONE (Oxy IR/ROXICODONE) immediate release tablet 5 mg  5 mg Oral Q4H PRN Hollice Gong, Mir M, MD   5 mg at 11/10/22 1256   pantoprazole (PROTONIX) EC tablet 40 mg  40 mg Oral Daily Hollice Gong, Mir M, MD   40 mg at 11/11/22 [id number]   polyethylene glycol (MIRALAX  / GLYCOLAX) packet 17 g  17 g Oral Daily PRN Hollice Gong, Mir M, MD       QUEtiapine (SEROQUEL) tablet 25 mg  25 mg Oral BID PRN Suella Broad, FNP   25 mg at 11/11/22 [id number]   QUEtiapine (SEROQUEL) tablet 50 mg  50 mg Oral QHS Suella Broad, FNP   50 mg at 11/10/22 2120   rosuvastatin (CRESTOR) tablet 20 mg  20 mg Oral Daily Hollice Gong, Mir M, MD   20 mg at 11/11/22 [id number]   thiamine (VITAMIN B1) tablet 100 mg  100 mg Oral Daily Bonnielee Haff, MD   100 mg at 11/11/22 [id number]    Psychiatric Specialty Exam:  Presentation  General Appearance:  Fairly Groomed  Eye Contact: Fleeting  Speech: Normal Rate  Speech Volume: Normal  Handedness:No data recorded  Mood and Affect  Mood: -- (ok)  Affect: Constricted   Thought Process  Thought Processes: Disorganized  Descriptions of Associations:Intact  Orientation:-- (oriented to person and place. Disoriented to time and situation)  Thought Content:Scattered  History of Schizophrenia/Schizoaffective disorder:No data recorded Duration of Psychotic Symptoms:No data recorded Hallucinations:No data recorded  Ideas of Reference:None  Suicidal Thoughts:No data recorded  Homicidal Thoughts:No data recorded   Sensorium  Memory: Recent Poor; Remote Good  Judgment: Poor  Insight: Poor   Executive Functions  Concentration: Poor  Attention Span: Poor  Recall: Poor  Fund of Knowledge: Poor  Language: Poor   Psychomotor Activity  Psychomotor Activity:No data recorded  Assets  Assets:No data recorded  Sleep  Sleep: No data recorded   Physical Exam: Physical Exam Vitals and nursing note reviewed.  Constitutional:      Appearance: She is well-developed and normal weight.  Skin:    General: Skin is warm and dry.     Capillary Refill: Capillary refill takes less than 2 seconds.  Neurological:     General: No focal deficit present.     Mental Status: She is alert and oriented to person,  place, and time.  Psychiatric:        Attention and Perception: Attention and perception normal.        Mood and Affect: Mood normal.        Speech: Speech normal.        Behavior: Behavior normal. Behavior is cooperative.        Thought Content: Thought content normal.        Cognition and Memory: Cognition and memory normal.        Judgment: Judgment normal.    Review of Systems  Neurological:  Dizziness: memory loss.  Psychiatric/Behavioral: Negative.    All other systems reviewed and are negative.  Blood pressure (!) 155/71, pulse 77, temperature 98 F (36.7 C), temperature source Oral, resp. rate 16, height 5\' 6"  (1.676 m), weight 70.3 kg, SpO2 99 %. Body mass index is 25.01 kg/m.  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  Disposition:  -May rescind IVC when patient is medically stable. -Discontinue safety sitter once medically stable and no longer under IVC. -Psychiatry will sign off at this time.  Suella Broad, FNP 11/11/2022 12:17 PM

## 2022-11-11 NOTE — Progress Notes (Signed)
Rounding Note    Patient Name: Teresa Poole Date of Encounter: 11/11/2022  [place] Cardiologist: Larae Grooms, MD   Subjective  BP 155/71.   Denies chest pain or dyspnea.  Inpatient Medications    Scheduled Meds:  amLODipine  10 mg Oral Daily   aspirin EC  81 mg Oral Daily   carvedilol  12.5 mg Oral BID WC   cyanocobalamin  1,000 mcg Intramuscular Daily   Followed by   Derrill Memo ON 11/12/2022] vitamin B-12  1,000 mcg Oral Daily   docusate sodium  100 mg Oral BID   heparin  5,000 Units Subcutaneous Q8H   insulin aspart  0-15 Units Subcutaneous TID WC   insulin aspart  0-5 Units Subcutaneous QHS   iron polysaccharides  150 mg Oral Daily   isosorbide mononitrate  30 mg Oral Daily   lidocaine  15 mL Oral Once   pantoprazole  40 mg Oral Daily   QUEtiapine  50 mg Oral QHS   rosuvastatin  20 mg Oral Daily   thiamine  100 mg Oral Daily   Continuous Infusions:  PRN Meds: acetaminophen **OR** acetaminophen, calcium carbonate, labetalol, methocarbamol, ondansetron **OR** ondansetron (ZOFRAN) IV, mouth rinse, oxyCODONE, polyethylene glycol, QUEtiapine   Vital Signs    Vitals:   11/10/22 1256 11/10/22 1341 11/10/22 2101 11/11/22 0610  BP:   116/69 (!) 155/71  Pulse:   81 77  Resp: 20 17 18 16   Temp:   98.2 F (36.8 C) 98 F (36.7 C)  TempSrc:   Oral Oral  SpO2:   100% 99%  Weight:      Height:        Intake/Output Summary (Last 24 hours) at 11/11/2022 1126 Last data filed at 11/11/2022 [id number] Gross per 24 hour  Intake 360 ml  Output 550 ml  Net -190 ml       11/06/2022    5:51 AM 11/04/2022    8:48 PM 10/10/2022    3:49 PM  Last 3 Weights  Weight (lbs) 154 lb 15.7 oz 165 lb 165 lb 5.5 oz  Weight (kg) 70.3 kg 74.844 kg 75 kg      Telemetry    Refuses telemetry- Personally Reviewed  ECG    NSR, RBBB, left anterior fascicular block, LVH, QTc 522  - Personally Reviewed  Physical Exam   GEN: No acute distress. Sitting upright in the bed    Neck: No JVD.  Cardiac: RRR, Respiratory: Clear to auscultation bilaterally. Normal WOB on room air  GI: Soft, nontender, non-distended  MS: No edema in BLE; No deformity. Neuro:  Nonfocal  Psych: Normal affect   Labs    High Sensitivity Troponin:   Recent Labs  Lab 11/02/22 1700 11/02/22 2026 11/04/22 2129 11/05/22 0429 11/05/22 2246  TROPONINIHS 68* 60* 49* 54* 39*      Chemistry Recent Labs  Lab 11/08/22 0410 11/09/22 1033 11/10/22 0807  NA 144 142 140  K 4.0 4.4 4.4  CL 109 107 105  CO2 25 25 23   GLUCOSE 87 215* 190*  BUN 46* 57* 59*  CREATININE 4.36* 4.78* 4.63*  CALCIUM 8.7* 8.7* 9.1  GFRNONAA 10* 9* 10*  ANIONGAP 10 10 12      Lipids No results for input(s): "CHOL", "TRIG", "HDL", "LABVLDL", "LDLCALC", "CHOLHDL" in the last 168 hours.  Hematology Recent Labs  Lab 11/08/22 0410 11/09/22 0421 11/10/22 0807  WBC 4.2 5.7 7.3  RBC 2.94* 2.84* 3.50*  HGB 8.4* 8.1* 10.0*  HCT 26.8*  26.0* 31.9*  MCV 91.2 91.5 91.1  MCH 28.6 28.5 28.6  MCHC 31.3 31.2 31.3  RDW 14.1 13.8 13.8  PLT 214 215 252    Thyroid No results for input(s): "TSH", "FREET4" in the last 168 hours.  BNPNo results for input(s): "BNP", "PROBNP" in the last 168 hours.  DDimer No results for input(s): "DDIMER" in the last 168 hours.   Radiology    No results found.  Cardiac Studies   Echocardiogram 3/15 1. Left ventricular ejection fraction, by estimation, is 45 to 50%. The  left ventricle has mildly decreased function. The left ventricle  demonstrates global hypokinesis. There is mild concentric left ventricular  hypertrophy. Left ventricular diastolic  parameters are consistent with Grade II diastolic dysfunction  (pseudonormalization).   2. Right ventricular systolic function is normal. The right ventricular  size is normal.   3. Left atrial size was moderately dilated.   4. The mitral valve is grossly normal. Trivial mitral valve  regurgitation. No evidence of mitral  stenosis.   5. The aortic valve is tricuspid. There is mild calcification of the  aortic valve. There is mild thickening of the aortic valve. Aortic valve  regurgitation is trivial. Aortic valve sclerosis/calcification is present,  without any evidence of aortic  stenosis.   6. The inferior vena cava is normal in size with greater than 50%  respiratory variability, suggesting right atrial pressure of 3 mmHg.   Carotid Duplex 11/05/22 Summary:  Right Carotid: Velocities in the right ICA are consistent with a 1-39%  stenosis.   Left Carotid: Velocities in the left ICA are consistent with a 40-59%  stenosis.               Non-hemodynamically significant plaque <50% noted in the  CCA.   Vertebrals: Bilateral vertebral arteries demonstrate antegrade flow.  Subclavians: Bilateral subclavian artery flow was disturbed.   Patient Profile     72 y.o. female with a hx of NICM, chronic combined CHF, mild nonobstructive CAD (cath 2015), anemia, CKD stage IV, HTN, HLD, GERD, carotid stenosis, L renal artery stenosis s/p stenting 2020, thyroid disease who is being seen for the evaluation of elevated troponin in the setting of HTN with medication compliance issues in the setting of signs of dementia  Assessment & Plan    Elevated troponins Chest Pain  --> in the setting of HTN emergency - High-sensitivity troponin 49>54>39.  Of note, on 3/11, hsTn was 68>60 - BP was elevated to 214/84 on arrival to the ED. Creatinine 4.61 - Patient did complain of chest pain in the ED, but it was very atypical.  Initially described as a stabbing/pounding sensation. Chest pain has since resolved and patient is currently chest pain free  - Suspect that chest pain is secondary to severely elevated blood pressure.  Trop elevation is likely type II MI in the setting of elevated BP, worsening kidney function - No need for IV heparin.   - Echo shows EF 45-50%, global hypokinesis   Hypertensive emergency - BP initially  elevated to 214/84 in the ED.  She was given a one time dose of clonidine and her home medications were resumed. Also started amlodipine 10 mg daily 3/14 - Continue amlodipine 10 mg daily. Holding lisinopril given poor kidney function. Resumed home carvedilol at 6.25 mg BID (was taking 25 mg BID at home).  Increased carvedilol to 12.5 mg twice daily given elevated pressures on 3/19 - Note, patient does have memory issues that have been worsening over  the past year. Sister at bedside did tell Dr. Harriet Masson that the patient may not be taking her medications as prescribed. Sister willing to help make sure patient is taking her medications after she is discharged    History of nonischemic cardiomyopathy - echo 3/15: EF 45-50%, grade II DD, no signs of PHTN - Echocardiogram in 2015 showed EF 30-35%.  Patient underwent heart catheterization in 2015 that showed nonobstructive CAD with serial 20% proximal and mid LAD stenosis, 30% proximal left circumflex stenosis, 30% mid AV groove circumflex stenosis. - Echocardiogram from 03/2022 showed EF 55-60%, no regional wall motion abnormalities - Patient euvolemic on exam. CXR without pulmonary edema or pleural effusions  - Continue carvedilol. Lisinopril held for poor renal function    CKD stage IV - Creatinine elevated to 4.61 on admission. Appears that baseline is around 3.3-3.7  -Nephrology consulted, suspect prerenal/ischemic insult with hypertensive emergency in setting of RAAS blockade.  Renal function stable at Cr 4.6, nephrology has signed off   History of nonobstructive CAD - Noted on heart catheterization in 2015 -Continue carvedilol, aspirin, Crestor  Delirium -Required haldol and starting seroquel for agitation.  Qtc 522 on EKG 3/18, would avoid QT prolonging agents   [facility] will sign off.   Medication Recommendations:  coreg 12.5 mg BID, amlodipine 10 mg daily.  Lisinopril discontinued given renal function Other recommendations  (labs, testing, etc): None Follow up as an outpatient: Will schedule  For questions or updates, please contact [place] Please consult [email or web address] for contact info under        Signed, Donato Heinz, MD  11/11/2022, 11:26 AM

## 2022-11-12 DIAGNOSIS — F03918 Unspecified dementia, unspecified severity, with other behavioral disturbance: Secondary | ICD-10-CM

## 2022-11-12 DIAGNOSIS — I161 Hypertensive emergency: Secondary | ICD-10-CM | POA: Diagnosis not present

## 2022-11-12 DIAGNOSIS — I5021 Acute systolic (congestive) heart failure: Secondary | ICD-10-CM | POA: Diagnosis not present

## 2022-11-12 LAB — VITAMIN B1: Vitamin B1 (Thiamine): 69.6 nmol/L (ref 66.5–200.0)

## 2022-11-12 LAB — GLUCOSE, CAPILLARY
Glucose-Capillary: 123 mg/dL — ABNORMAL HIGH (ref 70–99)
Glucose-Capillary: 193 mg/dL — ABNORMAL HIGH (ref 70–99)
Glucose-Capillary: 165 mg/dL — ABNORMAL HIGH (ref 70–99)
Glucose-Capillary: 148 mg/dL — ABNORMAL HIGH (ref 70–99)

## 2022-11-12 NOTE — TOC Progression Note (Signed)
Transition of Care [facility]) - Progression Note    Patient Details  Name: Teresa Poole MRN: [id number] Date of Birth: 06/15/1951  [facility] CM/SW [place], LCSW Phone Number: 11/12/2022, 2:42 PM  Clinical Narrative:     CSW spoke with pt's sister, she reported she would pick pt up at discharge. Pt's sister stated she will need to know what medications pt is on. CSW explained pt's bedside nurse will discuss d/c instructions. TOC to follow.     Expected Discharge Plan:  (TBD) Barriers to Discharge: Continued Medical Work up  Expected Discharge Plan and Services   Discharge Planning Services: CM Consult   Living arrangements for the past 2 months: Apartment                                       Social Determinants of Health (SDOH) Interventions SDOH Screenings   Food Insecurity: No Food Insecurity (11/08/2022)  Housing: Low Risk  (11/08/2022)  Transportation Needs: No Transportation Needs (11/08/2022)  Utilities: Not At Risk (11/08/2022)  Tobacco Use: Low Risk  (11/05/2022)    Readmission Risk Interventions    10/28/2021    2:56 PM  Readmission Risk Prevention Plan  Transportation Screening Complete  Medication Review ([place]) Complete  PCP or Specialist appointment within 3-5 days of discharge Complete  HRI or [place] Complete  SW Recovery Care/Counseling Consult Complete  [place] Not Applicable

## 2022-11-12 NOTE — Progress Notes (Signed)
Mobility Specialist - Progress Note   11/12/22 1229  Mobility  Activity Ambulated independently in hallway  Level of Assistance Independent  Assistive Device None  Distance Ambulated (ft) 350 ft  Activity Response Tolerated well  Mobility Referral Yes  $Mobility charge 1 Mobility   Pt received in bed and agreeable to mobility. No complaints during session. Pt to bed after session with all needs met. Sitter in room.   [facility]

## 2022-11-12 NOTE — Progress Notes (Signed)
[facility] PROGRESS NOTE   Teresa Poole [id number] DOB: Dec 24, 1950 DOA: 11/04/2022  PCP: [name], MD  Brief History/Interval Summary: 72 y.o. female with medical history significant for obesity, combined chronic systolic and diastolic congestive heart failure, CKD stage IV baseline creatinine about 3, history of renal artery stenosis status post stent about 5 years ago, admitted to the hospital with uncontrolled hypertension and atypical chest pain.  Noted to have systolic blood pressures greater than 200.  There is some concern about noncompliance although patient is certain she takes all of her medications.  Apparently there have been some memory issues recently.  Patient was hospitalized for further management.    Consultants: Cardiology.  Nephrology   Subjective/Interval History: She is alert, she is asking to go home.   Assessment/Plan:  1-Hypertensive emergency -Likely reason for her chest discomfort.  Systolic blood pressure was greater than 200.  Patient was given her antihypertensives with improvement in blood pressure.  Cardiology was consulted. -Continue with  amlodipine, carvedilol and nitrates.  ACE inhibitor is currently on hold due to worsening creatinine. -Renal ultrasound was done including renal vascular studies which showed some degree of left renal artery stenosis but not significant enough to cause her elevated blood pressures. -Carotid Dopplers did show moderate disease.  But she is asymptomatic.  Outpatient management. -Compliance is an issue.   2-Acute agitation/delirium/suicidal ideation/history of memory impairment. -Patient has underlying cognitive impairment as evidenced by history of memory impairment mentioned by her sister.   -She was quite agitated overnight on 3/15.  She was given Ativan without any effect.  -She was restrained.  She was given haloperidol and started on Seroquel.  Due to suicidal ideation psychiatry was  consulted.  Continue with sitter. -EKG reviewed on 3/17.  Even though the computer is calling it prolonged QT upon my calculation QT interval corrected for heart rate is 458 ms. -CT head did not show any acute findings.  TSH was normal when checked in February. -Vitamin B12 level noted to be borderline low at 231.  -Needs out patient referral to neurology.  -She lives by herself and in her current state is unsafe for discharge.   -She was involuntarily committed on 11/09/2022 based on recommendations of the psychiatrist due to agitation and suicidal ideation. -Psych continue to recommend IVC, needs ALF memory care unit.   3-Chest pain Atypical.  Likely secondary to elevated blood pressure.  Troponin trends have been flat. Echocardiogram shows diminished left ventricular EF of 45 to 50%.  No further episodes of chest pain noted.  Cardiology does not plan any further workup at this time.  Acute on chronic kidney disease stage IV Baseline creatinine is around 3.7.  Presented with a higher than baseline creatinine.  Followed by Dr. Joelyn Oms with [facility].  Nephrology was consulted.   Creatinine has stabilized.  She appears to have a new baseline now. Monitor urine output.  Renal ultrasound does not show any hydronephrosis.  Lisinopril and furosemide on hold currently. Nephrology has signed off.  History of cardiomyopathy Echocardiogram from this admission shows left ventricular EF of 45 to 50%.  Echocardiogram from August 2023 showed EF to be 55 to 60%.  Back in 2015 she had a EF of 30 to 35%. Seems to be fairly euvolemic currently.  Furosemide and lisinopril on hold due to elevated creatinine.  History of coronary artery disease This is nonobstructive based on cardiac catheterization in 2015.  Renal artery stenosis Underwent stent placement in February  2020.  1-59% stenosis of the left renal artery noted on renal vascular study.  Not significant enough to merit any urgent  intervention.   Continue statin.  Start aspirin at discharge.  Normocytic anemia Likely anemia of chronic disease.  No evidence of overt bleeding.  Drop in hemoglobin possibly dilutional.  Patient is on iron supplements.  Patient was given Aranesp on 3/17.  Vitamin B12 deficiency Vitamin B12 noted to be low at 231.  Started on supplementation.   DVT Prophylaxis: Subcutaneous heparin Code Status: Full code Family Communication: Sister being updated periodically. Disposition Plan: May need to go to memory care unit/assisted living facility.    Status is: Inpatient Remains inpatient appropriate because: Hypertensive emergency, acute kidney injury      Medications: Scheduled:  amLODipine  10 mg Oral Daily   aspirin EC  81 mg Oral Daily   carvedilol  12.5 mg Oral BID WC   vitamin B-12  1,000 mcg Oral Daily   docusate sodium  100 mg Oral BID   heparin  5,000 Units Subcutaneous Q8H   insulin aspart  0-15 Units Subcutaneous TID WC   insulin aspart  0-5 Units Subcutaneous QHS   iron polysaccharides  150 mg Oral Daily   isosorbide mononitrate  30 mg Oral Daily   lidocaine  15 mL Oral Once   pantoprazole  40 mg Oral Daily   QUEtiapine  50 mg Oral QHS   rosuvastatin  20 mg Oral Daily   thiamine  100 mg Oral Daily   Continuous: [id number] **OR** acetaminophen, calcium carbonate, haloperidol lactate, labetalol, methocarbamol, ondansetron **OR** ondansetron (ZOFRAN) IV, mouth rinse, oxyCODONE, polyethylene glycol, QUEtiapine  Antibiotics: Anti-infectives (From admission, onward)    None       Objective:  Vital Signs  Vitals:   11/11/22 1441 11/11/22 2105 11/12/22 0605 11/12/22 1202  BP: (!) 140/65 (!) 147/72 (!) 158/78 (!) 157/69  Pulse: 73 82 78 70  Resp: 16 18 16 18   Temp: 98.4 F (36.9 C) 98.5 F (36.9 C) 98.2 F (36.8 C) 97.9 F (36.6 C)  TempSrc: Oral Oral Oral Oral  SpO2: 99% 100% 100% 100%  Weight:      Height:        Intake/Output Summary (Last 24  hours) at 11/12/2022 1430 Last data filed at 11/12/2022 1205 Gross per 24 hour  Intake 995 ml  Output 400 ml  Net 595 ml    Filed Weights   11/04/22 2048 11/06/22 0551  Weight: 74.8 kg 70.3 kg   General appearance: Alert Didn't allow medical exam today    Lab Results:  Data Reviewed: I have personally reviewed following labs and reports of the imaging studies  CBC: Recent Labs  Lab 11/06/22 0744 11/07/22 0446 11/08/22 0410 11/09/22 0421 11/10/22 0807  WBC 4.3 5.4 4.2 5.7 7.3  HGB 9.2* 9.8* 8.4* 8.1* 10.0*  HCT 29.4* 30.9* 26.8* 26.0* 31.9*  MCV 89.1 89.0 91.2 91.5 91.1  PLT 222 254 214 215 252     Basic Metabolic Panel: Recent Labs  Lab 11/06/22 0744 11/07/22 0446 11/08/22 0410 11/09/22 1033 11/10/22 0807  NA 141 140 144 142 140  K 4.0 4.2 4.0 4.4 4.4  CL 105 103 109 107 105  CO2 26 25 25 25 23   GLUCOSE 119* 156* 87 215* 190*  BUN 45* 53* 46* 57* 59*  CREATININE 4.80* 4.71* 4.36* 4.78* 4.63*  CALCIUM 8.7* 9.0 8.7* 8.7* 9.1     GFR: Estimated Creatinine Clearance: 10.6 mL/min (A) (by  C-G formula based on SCr of 4.63 mg/dL (H)).    CBG: Recent Labs  Lab 11/11/22 1131 11/11/22 1631 11/11/22 2104 11/12/22 0813 11/12/22 1125  GLUCAP 166* 231* 188* 123* 193*       Radiology Studies: No results found.     LOS: 7 days   Taggert Bozzi A Katurah Karapetian  [facility] [employer] operational officer on [email or web address]  11/12/2022, 2:30 PM

## 2022-11-12 NOTE — Plan of Care (Signed)
Problem: Education: Goal: Knowledge of General Education information will improve Description: Including pain rating scale, medication(s)/side effects and non-pharmacologic comfort measures 11/12/2022 1312 by Carlynn Purl, RN Outcome: Not Progressing 11/12/2022 1311 by Carlynn Purl, RN Outcome: Not Progressing 11/12/2022 1311 by Carlynn Purl, RN Outcome: Not Met (add Reason)   Problem: Health Behavior/Discharge Planning: Goal: Ability to manage health-related needs will improve 11/12/2022 1312 by Carlynn Purl, RN Outcome: Not Progressing 11/12/2022 1311 by Carlynn Purl, RN Outcome: Not Met (add Reason) 11/12/2022 1311 by Carlynn Purl, RN Outcome: Not Met (add Reason)   Problem: Clinical Measurements: Goal: Ability to maintain clinical measurements within normal limits will improve 11/12/2022 1312 by Carlynn Purl, RN Outcome: Not Progressing 11/12/2022 1311 by Carlynn Purl, RN Outcome: Not Met (add Reason) 11/12/2022 1311 by Carlynn Purl, RN Outcome: Not Met (add Reason) Goal: Will remain free from infection 11/12/2022 1312 by Carlynn Purl, RN Outcome: Not Progressing 11/12/2022 1311 by Carlynn Purl, RN Outcome: Not Met (add Reason) 11/12/2022 1311 by Carlynn Purl, RN Outcome: Not Met (add Reason) Goal: Diagnostic test results will improve 11/12/2022 1312 by Carlynn Purl, RN Outcome: Not Progressing 11/12/2022 1311 by Carlynn Purl, RN Outcome: Not Met (add Reason) 11/12/2022 1311 by Carlynn Purl, RN Outcome: Not Met (add Reason) Goal: Respiratory complications will improve 11/12/2022 1312 by Carlynn Purl, RN Outcome: Not Progressing 11/12/2022 1311 by Carlynn Purl, RN Outcome: Not Met (add Reason) 11/12/2022 1311 by Carlynn Purl, RN Outcome: Not Met (add Reason) Goal: Cardiovascular complication will be avoided 11/12/2022 1312 by Carlynn Purl, RN Outcome: Not Progressing 11/12/2022 1311 by Carlynn Purl, RN Outcome: Not Met (add  Reason) 11/12/2022 1311 by Carlynn Purl, RN Outcome: Not Met (add Reason)   Problem: Activity: Goal: Risk for activity intolerance will decrease 11/12/2022 1312 by Carlynn Purl, RN Outcome: Not Progressing 11/12/2022 1311 by Carlynn Purl, RN Outcome: Not Met (add Reason) 11/12/2022 1311 by Carlynn Purl, RN Outcome: Not Met (add Reason)   Problem: Nutrition: Goal: Adequate nutrition will be maintained 11/12/2022 1312 by Carlynn Purl, RN Outcome: Not Progressing 11/12/2022 1311 by Carlynn Purl, RN Outcome: Not Met (add Reason) 11/12/2022 1311 by Carlynn Purl, RN Outcome: Not Met (add Reason)   Problem: Coping: Goal: Level of anxiety will decrease 11/12/2022 1312 by Carlynn Purl, RN Outcome: Not Progressing 11/12/2022 1311 by Carlynn Purl, RN Outcome: Not Met (add Reason) 11/12/2022 1311 by Carlynn Purl, RN Outcome: Not Met (add Reason)   Problem: Elimination: Goal: Will not experience complications related to bowel motility 11/12/2022 1312 by Carlynn Purl, RN Outcome: Not Progressing 11/12/2022 1311 by Carlynn Purl, RN Outcome: Not Met (add Reason) 11/12/2022 1311 by Carlynn Purl, RN Outcome: Not Met (add Reason) Goal: Will not experience complications related to urinary retention 11/12/2022 1312 by Carlynn Purl, RN Outcome: Not Progressing 11/12/2022 1311 by Carlynn Purl, RN Outcome: Not Met (add Reason) 11/12/2022 1311 by Carlynn Purl, RN Outcome: Not Met (add Reason)   Problem: Pain Managment: Goal: General experience of comfort will improve 11/12/2022 1312 by Carlynn Purl, RN Outcome: Not Progressing 11/12/2022 1311 by Carlynn Purl, RN Outcome: Not Met (add Reason) 11/12/2022 1311 by Carlynn Purl, RN Outcome: Not Met (add Reason)   Problem: Safety: Goal: Ability to remain free from injury will improve 11/12/2022 1312 by Carlynn Purl, RN  Outcome: Not Progressing 11/12/2022 1311 by Carlynn Purl, RN Outcome: Not Met (add  Reason) 11/12/2022 1311 by Carlynn Purl, RN Outcome: Not Met (add Reason)   Problem: Skin Integrity: Goal: Risk for impaired skin integrity will decrease 11/12/2022 1312 by Carlynn Purl, RN Outcome: Not Progressing 11/12/2022 1311 by Carlynn Purl, RN Outcome: Not Met (add Reason) 11/12/2022 1311 by Carlynn Purl, RN Outcome: Not Met (add Reason)   Problem: Safety: Goal: Non-violent Restraint(s) 11/12/2022 1312 by Carlynn Purl, RN Outcome: Not Progressing 11/12/2022 1311 by Carlynn Purl, RN Outcome: Not Met (add Reason) 11/12/2022 1311 by Carlynn Purl, RN Outcome: Not Met (add Reason)

## 2022-11-12 NOTE — Progress Notes (Signed)
Mobility Specialist - Progress Note   11/12/22 1425  Mobility  Activity Ambulated independently in hallway  Level of Assistance Independent  Assistive Device None  Distance Ambulated (ft) 350 ft  Activity Response Tolerated well  Mobility Referral Yes  $Mobility charge 1 Mobility   Pt received in bed and agreeable to mobility. No complaints during session. Pt to bed after session with all needs met. Sitter in room.   [facility]

## 2022-11-13 DIAGNOSIS — R0789 Other chest pain: Secondary | ICD-10-CM | POA: Diagnosis not present

## 2022-11-13 LAB — GLUCOSE, CAPILLARY
Glucose-Capillary: 147 mg/dL — ABNORMAL HIGH (ref 70–99)
Glucose-Capillary: 148 mg/dL — ABNORMAL HIGH (ref 70–99)

## 2022-11-13 MED ORDER — CARVEDILOL 25 MG PO TABS
25.0000 mg | ORAL_TABLET | Freq: Two times a day (BID) | ORAL | Status: DC
  Administered 2022-11-13: 25 mg via ORAL
  Filled 2022-11-13: qty 1

## 2022-11-13 MED ORDER — POLYSACCHARIDE IRON COMPLEX 150 MG PO CAPS: 150.0000 mg | ORAL_CAPSULE | Freq: Every day | ORAL | 0 refills | Status: DC

## 2022-11-13 MED ORDER — CARVEDILOL 25 MG PO TABS: 25.0000 mg | ORAL_TABLET | Freq: Two times a day (BID) | ORAL | 1 refills | Status: DC

## 2022-11-13 MED ORDER — CARVEDILOL 12.5 MG PO TABS: 12.5000 mg | ORAL_TABLET | Freq: Two times a day (BID) | ORAL | 1 refills | Status: DC

## 2022-11-13 MED ORDER — ISOSORBIDE MONONITRATE ER 30 MG PO TB24: 30.0000 mg | ORAL_TABLET | Freq: Every day | ORAL | 2 refills | Status: DC

## 2022-11-13 MED ORDER — AMLODIPINE BESYLATE 10 MG PO TABS: 10.0000 mg | ORAL_TABLET | Freq: Every day | ORAL | 3 refills | Status: DC

## 2022-11-13 MED ORDER — ASPIRIN 81 MG PO TBEC: 81.0000 mg | DELAYED_RELEASE_TABLET | Freq: Every day | ORAL | 12 refills | Status: DC

## 2022-11-13 MED ORDER — DOCUSATE SODIUM 100 MG PO CAPS: 100.0000 mg | ORAL_CAPSULE | Freq: Two times a day (BID) | ORAL | 0 refills | Status: DC

## 2022-11-13 MED ORDER — QUETIAPINE FUMARATE 50 MG PO TABS: 50.0000 mg | ORAL_TABLET | Freq: Every day | ORAL | 1 refills | Status: DC

## 2022-11-13 MED ORDER — CARVEDILOL 12.5 MG PO TABS
12.5000 mg | ORAL_TABLET | Freq: Once | ORAL | Status: AC
  Administered 2022-11-13: 12.5 mg via ORAL
  Filled 2022-11-13: qty 1

## 2022-11-13 MED ORDER — VITAMIN B-1 100 MG PO TABS: 100.0000 mg | ORAL_TABLET | Freq: Every day | ORAL | 0 refills | Status: DC

## 2022-11-13 MED ORDER — CYANOCOBALAMIN 1000 MCG PO TABS: 1000.0000 ug | ORAL_TABLET | Freq: Every day | ORAL | 0 refills | Status: DC

## 2022-11-13 MED ORDER — QUETIAPINE FUMARATE 25 MG PO TABS: 25.0000 mg | ORAL_TABLET | Freq: Two times a day (BID) | ORAL | 0 refills | Status: DC | PRN

## 2022-11-13 NOTE — TOC Progression Note (Signed)
Transition of Care [facility]) - Progression Note    Patient Details  Name: Teresa Poole MRN: [id number] Date of Birth: 1951/01/17  Transition of Care [facility]) CM/SW Jay, LCSW Phone Number: 11/13/2022, 12:28 PM  Clinical Narrative:    CSW faxed IVC rescission form to [facility] CSW spoke with pt's sister , she reports she will pick pt up around 5- 5:30pm. No additional TOC needs TOC sign off.  .    Expected Discharge Plan:  (TBD) Barriers to Discharge: Continued Medical Work up  Expected Discharge Plan and Services   Discharge Planning Services: CM Consult   Living arrangements for the past 2 months: Apartment Expected Discharge Date: 11/13/22                                     Social Determinants of Health (SDOH) Interventions SDOH Screenings   Food Insecurity: No Food Insecurity (11/08/2022)  Housing: Low Risk  (11/08/2022)  Transportation Needs: No Transportation Needs (11/08/2022)  Utilities: Not At Risk (11/08/2022)  Tobacco Use: Low Risk  (11/05/2022)    Readmission Risk Interventions    10/28/2021    2:56 PM  Readmission Risk Prevention Plan  Transportation Screening Complete  Medication Review ([place]) Complete  PCP or Specialist appointment within 3-5 days of discharge Complete  HRI or [place] Complete  SW Recovery Care/Counseling Consult Complete  [place] Not Applicable

## 2022-11-13 NOTE — Discharge Summary (Signed)
Physician Discharge Summary   Patient: Teresa Poole MRN: [id number] DOB: May 25, 1951  Admit date:     11/04/2022  Discharge date: 11/13/22  Discharge Physician: Elmarie Shiley   PCP: [name], MD   Recommendations at discharge:    Needs B-met to follow renal function.  Needs further conversation in regards compliance with medications.  She will need follow up with neurology   Discharge Diagnoses: Principal Problem:   Atypical chest pain Active Problems:   (HFpEF) heart failure with preserved ejection fraction (HCC)   Hypertension associated with diabetes ([place])   Chronic systolic heart failure (HCC)   Type 2 diabetes mellitus with chronic kidney disease, without long-term current use of insulin (HCC)   Hyperlipidemia associated with type 2 diabetes mellitus (HCC)   Acute systolic heart failure (HCC)   Hypertensive urgency   Cardiomyopathy, hypertensive ([place])   Chronic kidney disease (CKD), stage IV (severe) ([place])   Renal artery stenosis ([place])   Acute kidney injury ([place])   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 *  Hospital Course: 72 y.o. female with medical history significant for obesity, combined chronic systolic and diastolic congestive heart failure, CKD stage IV baseline creatinine about 3, history of renal artery stenosis status post stent about 5 years ago, admitted to the hospital with uncontrolled hypertension and atypical chest pain.  Noted to have systolic blood pressures greater than 200.  There is some concern about noncompliance although patient is certain she takes all of her medications.  Apparently there have been some memory issues recently.  Patient was hospitalized for further management.      Assessment and Plan: 1-Hypertensive emergency -Likely reason for  her chest discomfort.  Systolic blood pressure was greater than 200.  Patient was given her antihypertensives with improvement in blood pressure.  Cardiology was consulted. -Continue with  amlodipine, carvedilol and nitrates.  ACE inhibitor is currently on hold due to worsening creatinine. -Renal ultrasound was done including renal vascular studies which showed some degree of left renal artery stenosis but not significant enough to cause her elevated blood pressures. -Carotid Dopplers did show moderate disease.  But she is asymptomatic.  Outpatient management. -Compliance is an issue.  Patient agrees to take medications.   2-Acute agitation/delirium/suicidal ideation/history of memory impairment. -Patient has underlying cognitive impairment as evidenced by history of memory impairment mentioned by her sister.   -She was quite agitated overnight on 3/15.  She was given Ativan without any effect.  -She was restrained.  She was given haloperidol and started on Seroquel.  Due to suicidal ideation psychiatry was consulted.  Continue with sitter. -EKG reviewed on 3/17.  Even though the computer is calling it prolonged QT upon my calculation QT interval corrected for heart rate is 458 ms. -CT head did not show any acute findings.  TSH was normal when checked in February. -Vitamin B12 level noted to be borderline low at 231.  -Needs out patient referral to neurology.  -She lives by herself and in her current state is unsafe for discharge.   -She was involuntarily committed on 11/09/2022 based on recommendations of the psychiatrist due to agitation and suicidal ideation. -Psych recommended rescinded IVC. No need for inpatient psychiatric facility admission.  Patient will benefit from supervision at home. Plan is to discharge to sister house, and sister will  try to over time arrange care at patient own home.  She is calm , conversant, appropriate/    3-Chest pain Atypical.  Likely secondary to elevated  blood pressure.  Troponin trends have been flat. Echocardiogram shows diminished left ventricular EF of 45 to 50%.  No further episodes of chest pain noted.  Cardiology does not plan any further workup at this time.   Acute on chronic kidney disease stage IV Baseline creatinine is around 3.7.  Presented with a higher than baseline creatinine.  Followed by Dr. Joelyn Oms with [facility].  Nephrology was consulted.   Creatinine has stabilized.  She appears to have a new baseline now. Monitor urine output.  Renal ultrasound does not show any hydronephrosis.  Lisinopril and furosemide on hold currently. Nephrology has signed off.   History of cardiomyopathy Echocardiogram from this admission shows left ventricular EF of 45 to 50%.  Echocardiogram from August 2023 showed EF to be 55 to 60%.  Back in 2015 she had a EF of 30 to 35%. Seems to be fairly euvolemic currently.  Furosemide and lisinopril on hold due to elevated creatinine.   History of coronary artery disease This is nonobstructive based on cardiac catheterization in 2015.   Renal artery stenosis Underwent stent placement in February 2020.  1-59% stenosis of the left renal artery noted on renal vascular study.  Not significant enough to merit any urgent intervention.   Continue statin.     Normocytic anemia Likely anemia of chronic disease.  No evidence of overt bleeding.  Drop in hemoglobin possibly dilutional.  Patient is on iron supplements.  Patient was given Aranesp on 3/17.   Vitamin B12 deficiency Vitamin B12 noted to be low at 231.  Started on supplementation.          Consultants: Psych   Disposition: Home Diet recommendation:  Discharge Diet Orders (From admission, onward)     Start     Ordered   11/13/22 0000  Diet - low sodium heart healthy        11/13/22 1040           Cardiac diet DISCHARGE MEDICATION: Allergies as of 11/13/2022       Reactions   Hydralazine Hcl Itching   ENTIRE  BODY = burning sensation, also in the eyes (they have become very sensitive to light)   Atorvastatin Other (See Comments)   Per MD - pt not sure of reaction         Medication List     STOP taking these medications    furosemide 20 MG tablet Commonly known as: LASIX   furosemide 40 MG tablet Commonly known as: LASIX   guaiFENesin 100 MG/5ML liquid Commonly known as: ROBITUSSIN   lisinopril 40 MG tablet Commonly known as: ZESTRIL   Ozempic (0.25 or 0.5 MG/DOSE) 2 MG/1.5ML Sopn Generic drug: Semaglutide(0.25 or 0.5MG /DOS)   sucralfate 1 g tablet Commonly known as: Carafate       TAKE these medications    amLODipine 10 MG tablet Commonly known as: NORVASC Take 1 tablet (10 mg total) by mouth daily. Start taking on: November 14, 2022   aspirin EC 81 MG tablet Take 1 tablet (81 mg total) by mouth daily. Swallow whole. Start taking on: November 14, 2022   carvedilol 25 MG tablet Commonly known as: COREG Take 1 tablet (25 mg total) by mouth 2 (two) times daily with a meal.   cyanocobalamin 1000 MCG tablet Take 1 tablet (1,000 mcg total) by  mouth daily. Start taking on: November 14, 2022   docusate sodium 100 MG capsule Commonly known as: COLACE Take 1 capsule (100 mg total) by mouth 2 (two) times daily.   famotidine 40 MG tablet Commonly known as: PEPCID Take 40 mg by mouth daily.   glucose blood test strip Commonly known as: ONE TOUCH ULTRA TEST Use to check blood sugar 3 times daily Dx code E11.65 What changed:  how much to take how to take this when to take this   iron polysaccharides 150 MG capsule Commonly known as: NIFEREX Take 1 capsule (150 mg total) by mouth daily. Start taking on: November 14, 2022   isosorbide mononitrate 30 MG 24 hr tablet Commonly known as: IMDUR Take 1 tablet (30 mg total) by mouth daily. Start taking on: November 14, 2022   omeprazole 40 MG capsule Commonly known as: PRILOSEC Take 40 mg by mouth daily.   polyethylene glycol  17 g packet Commonly known as: MIRALAX / GLYCOLAX Take 17 g by mouth daily.   QUEtiapine 50 MG tablet Commonly known as: SEROQUEL Take 1 tablet (50 mg total) by mouth at bedtime.   QUEtiapine 25 MG tablet Commonly known as: SEROQUEL Take 1 tablet (25 mg total) by mouth 2 (two) times daily as needed (Agitation).   rosuvastatin 20 MG tablet Commonly known as: CRESTOR Take 20 mg by mouth daily.   thiamine 100 MG tablet Commonly known as: Vitamin B-1 Take 1 tablet (100 mg total) by mouth daily. Start taking on: November 14, 2022        Discharge Exam: Danley Danker Weights   11/04/22 2048 11/06/22 0551  Weight: 74.8 kg 70.3 kg   General ; NAD Condition at discharge: stable  The results of significant diagnostics from this hospitalization (including imaging, microbiology, ancillary and laboratory) are listed below for reference.   Imaging Studies: VAS US RENAL ARTERY DUPLEX  Result Date: 11/06/2022 ABDOMINAL VISCERAL Patient Name:  Teresa Poole  Date of Exam:   11/06/2022 Medical Rec #: [id number]           Accession #:    [id number] Date of Birth: 26-Feb-1951           Patient Gender: F Patient Age:   51 years Exam Location:  [facility] Procedure:      VAS US RENAL ARTERY DUPLEX Referring Phys: [id number] Bonnielee Haff -------------------------------------------------------------------------------- Indications: Hypertension High Risk Factors: Hypertension, hyperlipidemia, Diabetes. Vascular Interventions: Left renal artery stent. Limitations: Air/bowel gas. Comparison Study: No prior studies. Performing Technologist: Oliver Hum RVT  Examination Guidelines: A complete evaluation includes B-mode imaging, spectral Doppler, color Doppler, and power Doppler as needed of all accessible portions of each vessel. Bilateral testing is considered an integral part of a complete examination. Limited examinations for reoccurring indications may be performed as noted.  Duplex Findings:  +--------------------+--------+--------+------+------------------+ Mesenteric          PSV cm/sEDV cm/sPlaque     Comments      +--------------------+--------+--------+------+------------------+ Aorta Mid              58      8                             +--------------------+--------+--------+------+------------------+ Celiac Artery Origin                      Unable to insonate +--------------------+--------+--------+------+------------------+ SMA Origin  Unable to insonate +--------------------+--------+--------+------+------------------+    +------------------+--------+--------+-------+ Right Renal ArteryPSV cm/sEDV cm/sComment +------------------+--------+--------+-------+ Origin              108      14           +------------------+--------+--------+-------+ Proximal             74      13           +------------------+--------+--------+-------+ Mid                 110      13           +------------------+--------+--------+-------+ Distal               81      10           +------------------+--------+--------+-------+ +-----------------+--------+--------+-------+ Left Renal ArteryPSV cm/sEDV cm/sComment +-----------------+--------+--------+-------+ Origin             112      15           +-----------------+--------+--------+-------+ Proximal           190      32           +-----------------+--------+--------+-------+ Mid                 38      7            +-----------------+--------+--------+-------+ Distal              35      6            +-----------------+--------+--------+-------+ +------------+--------+--------+----+-----------+--------+--------+----+ Right KidneyPSV cm/sEDV cm/sRI  Left KidneyPSV cm/sEDV cm/sRI   +------------+--------+--------+----+-----------+--------+--------+----+ Upper Pole  30      5       0.82Upper Pole 33      6       0.82  +------------+--------+--------+----+-----------+--------+--------+----+ Mid         26      5       0.80Mid        37      8       0.79 +------------+--------+--------+----+-----------+--------+--------+----+ Lower Pole  20      5       0.77Lower Pole 24      10      0.59 +------------+--------+--------+----+-----------+--------+--------+----+ Hilar       96      10      0.90Hilar      31      7       0.77 +------------+--------+--------+----+-----------+--------+--------+----+ +------------------+----+------------------+----+ Right Kidney          Left Kidney            +------------------+----+------------------+----+ RAR                   RAR                    +------------------+----+------------------+----+ RAR (manual)      1.9 RAR (manual)      3.3  +------------------+----+------------------+----+ Cortex                Cortex                 +------------------+----+------------------+----+ Cortex thickness      Corex thickness        +------------------+----+------------------+----+ Kidney length (cm)9.50Kidney length (cm)8.50 +------------------+----+------------------+----+  Summary: Renal:  Right: Normal size right kidney. Abnormal right Resistive Index. No  evidence of right renal artery stenosis. Left:  Normal size of left kidney. Abnormal left Resisitve Index.        1-59% stenosis of the left renal artery.  *See table(s) above for measurements and observations.  Diagnosing physician: Servando Snare MD  Electronically signed by Servando Snare MD on 11/06/2022 at 4:53:43 PM.    Final    ECHOCARDIOGRAM COMPLETE  Result Date: 11/06/2022    ECHOCARDIOGRAM REPORT   Patient Name:   Teresa Poole Date of Exam: 11/06/2022 Medical Rec #:  [id number]          Height:       66.0 in Accession #:    [id number]         Weight:       155.0 lb Date of Birth:  1951/08/16          BSA:          1.794 m Patient Age:    42 years           BP:            147/63 mmHg Patient Gender: F                  HR:           69 bpm. Exam Location:  Inpatient Procedure: 2D Echo, Color Doppler and Cardiac Doppler Indications:    R07.9* Chest pain, unspecified  History:        Patient has prior history of Echocardiogram examinations. CHF,                 CAD, Arrythmias:RBBB; Risk Factors:Dyslipidemia, Diabetes and                 Hypertension.  Sonographer:    Phineas Douglas Referring Phys: [id number] [place]  1. Left ventricular ejection fraction, by estimation, is 45 to 50%. The left ventricle has mildly decreased function. The left ventricle demonstrates global hypokinesis. There is mild concentric left ventricular hypertrophy. Left ventricular diastolic parameters are consistent with Grade II diastolic dysfunction (pseudonormalization).  2. Right ventricular systolic function is normal. The right ventricular size is normal.  3. Left atrial size was moderately dilated.  4. The mitral valve is grossly normal. Trivial mitral valve regurgitation. No evidence of mitral stenosis.  5. The aortic valve is tricuspid. There is mild calcification of the aortic valve. There is mild thickening of the aortic valve. Aortic valve regurgitation is trivial. Aortic valve sclerosis/calcification is present, without any evidence of aortic stenosis.  6. The inferior vena cava is normal in size with greater than 50% respiratory variability, suggesting right atrial pressure of 3 mmHg. Comparison(s): Prior images reviewed side by side. Changes from prior study are noted. Conclusion(s)/Recommendation(s): Mildly reduced LVEF compared to prior, in a pattern of global hypokinesis. No focal wall motion abnormalities noted. FINDINGS  Left Ventricle: Left ventricular ejection fraction, by estimation, is 45 to 50%. The left ventricle has mildly decreased function. The left ventricle demonstrates global hypokinesis. The left ventricular internal cavity size was normal in size. There is  mild  concentric left ventricular hypertrophy. Left ventricular diastolic parameters are consistent with Grade II diastolic dysfunction (pseudonormalization). Right Ventricle: The right ventricular size is normal. No increase in right ventricular wall thickness. Right ventricular systolic function is normal. Left Atrium: Left atrial size was moderately dilated. Right Atrium: Right atrial size was normal in size. Pericardium: Trivial pericardial effusion is present. Mitral Valve: The mitral valve is grossly  normal. Trivial mitral valve regurgitation. No evidence of mitral valve stenosis. Tricuspid Valve: The tricuspid valve is grossly normal. Tricuspid valve regurgitation is trivial. No evidence of tricuspid stenosis. Aortic Valve: The aortic valve is tricuspid. There is mild calcification of the aortic valve. There is mild thickening of the aortic valve. Aortic valve regurgitation is trivial. Aortic regurgitation PHT measures 395 msec. Aortic valve sclerosis/calcification is present, without any evidence of aortic stenosis. Pulmonic Valve: The pulmonic valve was not well visualized. Pulmonic valve regurgitation is not visualized. No evidence of pulmonic stenosis. Aorta: The aortic root, ascending aorta, aortic arch and descending aorta are all structurally normal, with no evidence of dilitation or obstruction. Venous: The inferior vena cava is normal in size with greater than 50% respiratory variability, suggesting right atrial pressure of 3 mmHg. IAS/Shunts: The atrial septum is grossly normal.  LEFT VENTRICLE PLAX 2D LVIDd:         5.00 cm      Diastology LVIDs:         3.90 cm      LV e' medial:    4.68 cm/s LV PW:         1.20 cm      LV E/e' medial:  24.4 LV IVS:        1.20 cm      LV e' lateral:   5.22 cm/s LVOT diam:     1.90 cm      LV E/e' lateral: 21.8 LV SV:         62 LV SV Index:   34 LVOT Area:     2.84 cm  LV Volumes (MOD) LV vol d, MOD A2C: 121.0 ml LV vol d, MOD A4C: 136.0 ml LV vol s, MOD A2C: 61.2 ml  LV vol s, MOD A4C: 71.0 ml LV SV MOD A2C:     59.8 ml LV SV MOD A4C:     136.0 ml LV SV MOD BP:      65.0 ml RIGHT VENTRICLE             IVC RV Basal diam:  3.70 cm     IVC diam: 1.90 cm RV S prime:     12.10 cm/s TAPSE (M-mode): 2.0 cm LEFT ATRIUM             Index        RIGHT ATRIUM           Index LA diam:        3.70 cm 2.06 cm/m   RA Area:     14.40 cm LA Vol (A2C):   83.9 ml 46.76 ml/m  RA Volume:   34.40 ml  19.17 ml/m LA Vol (A4C):   67.0 ml 37.34 ml/m LA Biplane Vol: 75.3 ml 41.96 ml/m  AORTIC VALVE LVOT Vmax:   91.40 cm/s LVOT Vmean:  64.500 cm/s LVOT VTI:    0.217 m AI PHT:      395 msec  AORTA Ao Root diam: 2.80 cm Ao Asc diam:  3.10 cm MITRAL VALVE MV Area (PHT): 2.84 cm     SHUNTS MV Decel Time: 267 msec     Systemic VTI:  0.22 m MV E velocity: 114.00 cm/s  Systemic Diam: 1.90 cm MV A velocity: 124.00 cm/s MV E/A ratio:  0.92 Buford Dresser MD Electronically signed by Buford Dresser MD Signature Date/Time: 11/06/2022/2:58:31 PM    Final    US RENAL  Result Date: 11/06/2022 CLINICAL DATA:  Acute kidney injury EXAM: RENAL /  URINARY TRACT ULTRASOUND COMPLETE COMPARISON:  None Available. FINDINGS: Right Kidney: Renal measurements: 6.6 x 4.2 x 3.2 cm = volume: 46.4 mL. No hydronephrosis. Increased renal cortical echogenicity. Small simple cysts measuring up to 1.7 and 1.8 cm. Left Kidney: Renal measurements: 6.6 x 3.9 x 3.4 cm = volume: 46.1 mL. No hydronephrosis. Increased renal cortical echogenicity. Bladder: Partially obscured by bowel gas.  Appears mildly distended. Other: None. IMPRESSION: No hydronephrosis. Increased renal cortical echogenicity bilaterally, as can be seen in medical renal disease. Electronically Signed   By: Maurine Simmering M.D.   On: 11/06/2022 12:37   VAS US CAROTID  Result Date: 11/05/2022 Carotid Arterial Duplex Study Patient Name:  Teresa Poole  Date of Exam:   11/05/2022 Medical Rec #: [id number]           Accession #:    [id number] Date of Birth:  July 16, 1951           Patient Gender: F Patient Age:   19 years Exam Location:  [facility] Procedure:      VAS US CAROTID Referring Phys: M[facility] --------------------------------------------------------------------------------  Indications:       History of left ICA stenosis, left neck and ear pain. Risk Factors:      Hypertension, hyperlipidemia, Diabetes, no history of                    smoking, mild coronary artery disease. Other Factors:     Renal artery stenosis. Comparison Study:  07-01-2022 MRA Neck indicating approximately 50% left ICA                    stenosis and severe left vertebral artery stenosis. Performing Technologist: Darlin Coco RDMS, RVT  Examination Guidelines: A complete evaluation includes B-mode imaging, spectral Doppler, color Doppler, and power Doppler as needed of all accessible portions of each vessel. Bilateral testing is considered an integral part of a complete examination. Limited examinations for reoccurring indications may be performed as noted.  Right Carotid Findings: +----------+--------+--------+--------+-------------------+--------------------+           PSV cm/sEDV cm/sStenosisPlaque Description Comments             +----------+--------+--------+--------+-------------------+--------------------+ CCA Prox  76      12                                                      +----------+--------+--------+--------+-------------------+--------------------+ CCA Distal81      11                                 intimal thickening   +----------+--------+--------+--------+-------------------+--------------------+ ICA Prox  166     37      1-39%   calcific and       Upper range-                                           heterogenous       borderline 40-59%    +----------+--------+--------+--------+-------------------+--------------------+ ICA Mid   104     26                                                       +----------+--------+--------+--------+-------------------+--------------------+  ICA Distal103     29                                                      +----------+--------+--------+--------+-------------------+--------------------+ +----------+--------+-------+---------+-------------------+           PSV cm/sEDV cmsDescribe Arm Pressure (mmHG) +----------+--------+-------+---------+-------------------+ Subclavian70             Turbulent                    +----------+--------+-------+---------+-------------------+ +---------+--------+---+--------+--+---------+ VertebralPSV cm/s117EDV cm/s23Antegrade +---------+--------+---+--------+--+---------+  Left Carotid Findings: +----------+--------+--------+--------+----------------------------+-----------+           PSV cm/sEDV cm/sStenosisPlaque Description          Comments    +----------+--------+--------+--------+----------------------------+-----------+ CCA Prox  92      14                                                      +----------+--------+--------+--------+----------------------------+-----------+ CCA Distal103     20      <50%    heterogenous                            +----------+--------+--------+--------+----------------------------+-----------+ ICA Prox  213     51      40-59%  calcific, heterogenous and  Upper range                                   irregular                               +----------+--------+--------+--------+----------------------------+-----------+ ICA Mid   145     29                                                      +----------+--------+--------+--------+----------------------------+-----------+ ICA Distal150     32                                                      +----------+--------+--------+--------+----------------------------+-----------+ ECA       64                                                               +----------+--------+--------+--------+----------------------------+-----------+ +----------+--------+--------+---------+-------------------+           PSV cm/sEDV cm/sDescribe Arm Pressure (mmHG) +----------+--------+--------+---------+-------------------+ [id number]             Turbulent                    +----------+--------+--------+---------+-------------------+ +---------+--------+--+--------+--+---------+ VertebralPSV cm/s47EDV cm/s11Antegrade +---------+--------+--+--------+--+---------+  Summary: Right Carotid: Velocities in the right ICA are consistent with a 1-39% stenosis. Left Carotid: Velocities in the left ICA are consistent with a 40-59% stenosis.               Non-hemodynamically significant plaque <50% noted in the CCA. Vertebrals:  Bilateral vertebral arteries demonstrate antegrade flow. Subclavians: Bilateral subclavian artery flow was disturbed. *See table(s) above for measurements and observations.  Electronically signed by Antony Contras MD on 11/05/2022 at 12:13:57 PM.    Final    CT Head Wo Contrast  Result Date: 11/04/2022 CLINICAL DATA:  Headache EXAM: CT HEAD WITHOUT CONTRAST TECHNIQUE: Contiguous axial images were obtained from the base of the skull through the vertex without intravenous contrast. RADIATION DOSE REDUCTION: This exam was performed according to the departmental dose-optimization program which includes automated exposure control, adjustment of the mA and/or kV according to patient size and/or use of iterative reconstruction technique. COMPARISON:  CT head 10/18/2022.  MRI head 04/03/2022. FINDINGS: Brain: No evidence of acute infarction, hemorrhage, hydrocephalus, extra-axial collection or mass lesion/mass effect. Vascular: Atherosclerotic calcifications are present within the cavernous internal carotid arteries. Skull: Normal. Negative for fracture or focal lesion. Sinuses/Orbits: No acute finding. Other: None. IMPRESSION: No acute intracranial  abnormality. Electronically Signed   By: Ronney Asters M.D.   On: 11/04/2022 22:08   DG Chest 2 View  Result Date: 11/04/2022 CLINICAL DATA:  chest pains EXAM: CHEST - 2 VIEW COMPARISON:  Chest x-ray 11/02/2022, chest x-ray 10/18/2022, chest x-ray 05/14/2022 FINDINGS: The heart and mediastinal contours are unchanged. Aortic calcification. Question developing airspace/interstitial opacity overlying the left upper lung zone. No pulmonary edema. No pleural effusion. No pneumothorax. No acute osseous abnormality. IMPRESSION: 1. Question developing airspace/interstitial opacity overlying the left upper lung zone. 2.  Aortic Atherosclerosis (ICD10-I70.0). Electronically Signed   By: Iven Finn M.D.   On: 11/04/2022 21:32   DG Chest 2 View  Result Date: 11/02/2022 CLINICAL DATA:  Chest pain, RIGHT arm pain, congestion EXAM: CHEST - 2 VIEW COMPARISON:  10/18/2022 FINDINGS: Normal heart size, mediastinal contours, and pulmonary vascularity. Atherosclerotic calcification aorta. Lungs clear. No pulmonary infiltrate, pleural effusion, or pneumothorax. Osseous structures unremarkable. IMPRESSION: No acute abnormalities. Aortic Atherosclerosis (ICD10-I70.0). Electronically Signed   By: Lavonia Dana M.D.   On: 11/02/2022 17:24   CT Head Wo Contrast  Result Date: 10/19/2022 CLINICAL DATA:  Central vertigo EXAM: CT HEAD WITHOUT CONTRAST TECHNIQUE: Contiguous axial images were obtained from the base of the skull through the vertex without intravenous contrast. RADIATION DOSE REDUCTION: This exam was performed according to the departmental dose-optimization program which includes automated exposure control, adjustment of the mA and/or kV according to patient size and/or use of iterative reconstruction technique. COMPARISON:  10/10/2022 FINDINGS: Brain: No intracranial hemorrhage, mass effect, or evidence of acute infarct. No hydrocephalus. No extra-axial fluid collection. Generalized cerebral atrophy. Ill-defined  hypoattenuation within the cerebral white matter is nonspecific but consistent with chronic small vessel ischemic disease. Vascular: No hyperdense vessel. Intracranial arterial calcification. Skull: No fracture or focal lesion. Sinuses/Orbits: No acute finding. Paranasal sinuses and mastoid air cells are well aerated. Other: None. IMPRESSION: 1. No acute intracranial abnormality. 2. Chronic small vessel ischemic disease and cerebral atrophy. Electronically Signed   By: Placido Sou M.D.   On: 10/19/2022 00:16   DG Chest 2 View  Result Date: 10/18/2022 CLINICAL DATA:  Chest pain and chills. EXAM: CHEST - 2 VIEW COMPARISON:  PA Lat 10/10/2022. FINDINGS: The heart size and mediastinal contours are  within normal limits. Thoracic aorta is heavily calcified. Both lungs are clear. The visualized skeletal structures are intact, with thoracic spondylosis. IMPRESSION: No evidence of acute chest disease.  Aortic atherosclerosis. Electronically Signed   By: Telford Nab M.D.   On: 10/18/2022 22:17    Microbiology: Results for orders placed or performed during the hospital encounter of 09/20/22  Resp panel by RT-PCR (RSV, Flu A&B, Covid) Anterior Nasal Swab     Status: None   Collection Time: 09/20/22  9:52 PM   Specimen: Anterior Nasal Swab  Result Value Ref Range Status   SARS Coronavirus 2 by RT PCR NEGATIVE NEGATIVE Final    Comment: (NOTE) SARS-CoV-2 target nucleic acids are NOT DETECTED.  The SARS-CoV-2 RNA is generally detectable in upper respiratory specimens during the acute phase of infection. The lowest concentration of SARS-CoV-2 viral copies this assay can detect is 138 copies/mL. A negative result does not preclude SARS-Cov-2 infection and should not be used as the sole basis for treatment or other patient management decisions. A negative result may occur with  improper specimen collection/handling, submission of specimen other than nasopharyngeal swab, presence of viral mutation(s)  within the areas targeted by this assay, and inadequate number of viral copies(<138 copies/mL). A negative result must be combined with clinical observations, patient history, and epidemiological information. The expected result is Negative.  Fact Sheet for Patients:  EntrepreneurPulse.com.au  Fact Sheet for Healthcare Providers:  IncredibleEmployment.be  This test is no t yet approved or cleared by the [place] FDA and  has been authorized for detection and/or diagnosis of SARS-CoV-2 by FDA under an Emergency Use Authorization (EUA). This EUA will remain  in effect (meaning this test can be used) for the duration of the COVID-19 declaration under Section 564(b)(1) of the Act, 21 U.S.C.section 360bbb-3(b)(1), unless the authorization is terminated  or revoked sooner.       Influenza A by PCR NEGATIVE NEGATIVE Final   Influenza B by PCR NEGATIVE NEGATIVE Final    Comment: (NOTE) The Xpert Xpress SARS-CoV-2/FLU/RSV plus assay is intended as an aid in the diagnosis of influenza from Nasopharyngeal swab specimens and should not be used as a sole basis for treatment. Nasal washings and aspirates are unacceptable for Xpert Xpress SARS-CoV-2/FLU/RSV testing.  Fact Sheet for Patients: EntrepreneurPulse.com.au  Fact Sheet for Healthcare Providers: IncredibleEmployment.be  This test is not yet approved or cleared by the [place] FDA and has been authorized for detection and/or diagnosis of SARS-CoV-2 by FDA under an Emergency Use Authorization (EUA). This EUA will remain in effect (meaning this test can be used) for the duration of the COVID-19 declaration under Section 564(b)(1) of the Act, 21 U.S.C. section 360bbb-3(b)(1), unless the authorization is terminated or revoked.     Resp Syncytial Virus by PCR NEGATIVE NEGATIVE Final    Comment: (NOTE) Fact Sheet for  Patients: EntrepreneurPulse.com.au  Fact Sheet for Healthcare Providers: IncredibleEmployment.be  This test is not yet approved or cleared by the [place] FDA and has been authorized for detection and/or diagnosis of SARS-CoV-2 by FDA under an Emergency Use Authorization (EUA). This EUA will remain in effect (meaning this test can be used) for the duration of the COVID-19 declaration under Section 564(b)(1) of the Act, 21 U.S.C. section 360bbb-3(b)(1), unless the authorization is terminated or revoked.  Performed at [facility], [place] [address] [place], [place] [address]     Labs: CBC: Recent Labs  Lab 11/07/22 [phone] 11/08/22 0410 11/09/22 0421 11/10/22 [id number]  WBC 5.4 4.2 5.7 7.3  HGB 9.8* 8.4* 8.1* 10.0*  HCT 30.9* 26.8* 26.0* 31.9*  MCV 89.0 91.2 91.5 91.1  PLT 254 214 215 [id number]   Basic Metabolic Panel: Recent Labs  Lab 11/07/22 0446 11/08/22 0410 11/09/22 1033 11/10/22 0807  NA 140 144 142 140  K 4.2 4.0 4.4 4.4  CL 103 109 107 105  CO2 25 25 25 23   GLUCOSE 156* 87 215* 190*  BUN 53* 46* 57* 59*  CREATININE 4.71* 4.36* 4.78* 4.63*  CALCIUM 9.0 8.7* 8.7* 9.1   Liver Function Tests: No results for input(s): "AST", "ALT", "ALKPHOS", "BILITOT", "PROT", "ALBUMIN" in the last 168 hours. CBG: Recent Labs  Lab 11/12/22 0813 11/12/22 1125 11/12/22 1637 11/12/22 2141 11/13/22 0845  GLUCAP 123* 193* 165* 148* 147*    Discharge time spent: greater than 30 minutes.  Signed: Elmarie Shiley, MD [facility] 11/13/2022

## 2022-11-13 NOTE — Plan of Care (Signed)
  Problem: Safety: Goal: Ability to remain free from injury will improve Outcome: Progressing   Problem: Education: Goal: Knowledge of General Education information will improve Description: Including pain rating scale, medication(s)/side effects and non-pharmacologic comfort measures Outcome: Not Progressing

## 2022-11-13 NOTE — Care Management Important Message (Signed)
Important Message  Patient Details IM Letter placed in room. Name: Teresa Poole MRN: [id number] Date of Birth: 20-Jun-1951   Medicare Important Message Given:  Yes     Kerin Salen 11/13/2022, 11:58 AM

## 2022-11-13 NOTE — Progress Notes (Signed)
Pt bp elevated this morning. Said she is dehydrated and will not take any more medicine because she do not want to be murdered.

## 2022-11-15 ENCOUNTER — Emergency Department (HOSPITAL_COMMUNITY)
Admission: EM | Admit: 2022-11-15 | Discharge: 2022-11-15 | Disposition: A | Discharge status: Home / Self Care | Payer: Medicare PPO | Attending: Emergency Medicine | Admitting: Emergency Medicine

## 2022-11-15 ENCOUNTER — Encounter (HOSPITAL_COMMUNITY): Payer: Self-pay

## 2022-11-15 ENCOUNTER — Other Ambulatory Visit: Payer: Self-pay

## 2022-11-15 ENCOUNTER — Emergency Department (HOSPITAL_COMMUNITY): Payer: Medicare PPO

## 2022-11-15 DIAGNOSIS — Z79899 Other long term (current) drug therapy: Secondary | ICD-10-CM | POA: Insufficient documentation

## 2022-11-15 DIAGNOSIS — I5042 Chronic combined systolic (congestive) and diastolic (congestive) heart failure: Secondary | ICD-10-CM | POA: Insufficient documentation

## 2022-11-15 DIAGNOSIS — R0789 Other chest pain: Secondary | ICD-10-CM | POA: Diagnosis present

## 2022-11-15 DIAGNOSIS — E1122 Type 2 diabetes mellitus with diabetic chronic kidney disease: Secondary | ICD-10-CM | POA: Insufficient documentation

## 2022-11-15 DIAGNOSIS — R748 Abnormal levels of other serum enzymes: Secondary | ICD-10-CM | POA: Diagnosis not present

## 2022-11-15 DIAGNOSIS — I2510 Atherosclerotic heart disease of native coronary artery without angina pectoris: Secondary | ICD-10-CM | POA: Insufficient documentation

## 2022-11-15 DIAGNOSIS — Z8616 Personal history of COVID-19: Secondary | ICD-10-CM | POA: Diagnosis not present

## 2022-11-15 DIAGNOSIS — N184 Chronic kidney disease, stage 4 (severe): Secondary | ICD-10-CM | POA: Insufficient documentation

## 2022-11-15 DIAGNOSIS — R0602 Shortness of breath: Secondary | ICD-10-CM | POA: Insufficient documentation

## 2022-11-15 DIAGNOSIS — I130 Hypertensive heart and chronic kidney disease with heart failure and stage 1 through stage 4 chronic kidney disease, or unspecified chronic kidney disease: Secondary | ICD-10-CM | POA: Insufficient documentation

## 2022-11-15 DIAGNOSIS — Z8543 Personal history of malignant neoplasm of ovary: Secondary | ICD-10-CM | POA: Diagnosis not present

## 2022-11-15 DIAGNOSIS — Z7982 Long term (current) use of aspirin: Secondary | ICD-10-CM | POA: Diagnosis not present

## 2022-11-15 LAB — COMPREHENSIVE METABOLIC PANEL
ALT: 13 U/L (ref 0–44)
AST: 17 U/L (ref 15–41)
Albumin: 3.3 g/dL — ABNORMAL LOW (ref 3.5–5.0)
Alkaline Phosphatase: 57 U/L (ref 38–126)
Anion gap: 15 (ref 5–15)
BUN: 74 mg/dL — ABNORMAL HIGH (ref 8–23)
CO2: 23 mmol/L (ref 22–32)
Calcium: 8.9 mg/dL (ref 8.9–10.3)
Chloride: 102 mmol/L (ref 98–111)
Creatinine, Ser: 4.54 mg/dL — ABNORMAL HIGH (ref 0.44–1.00)
GFR, Estimated: 10 mL/min — ABNORMAL LOW (ref >60)
Glucose, Bld: 177 mg/dL — ABNORMAL HIGH (ref 70–99)
Potassium: 4.4 mmol/L (ref 3.5–5.1)
Sodium: 140 mmol/L (ref 135–145)
Total Bilirubin: 0.5 mg/dL (ref 0.3–1.2)
Total Protein: 6.9 g/dL (ref 6.5–8.1)

## 2022-11-15 LAB — CBC WITH DIFFERENTIAL/PLATELET
Abs Immature Granulocytes: 0.03 10*3/uL (ref 0.00–0.07)
Basophils Absolute: 0 10*3/uL (ref 0.0–0.1)
Basophils Relative: 1 %
Eosinophils Absolute: 0.2 10*3/uL (ref 0.0–0.5)
Eosinophils Relative: 3 %
HCT: 30.5 % — ABNORMAL LOW (ref 36.0–46.0)
Hemoglobin: 9.8 g/dL — ABNORMAL LOW (ref 12.0–15.0)
Immature Granulocytes: 0 %
Lymphocytes Relative: 26 %
Lymphs Abs: 1.9 10*3/uL (ref 0.7–4.0)
MCH: 28.7 pg (ref 26.0–34.0)
MCHC: 32.1 g/dL (ref 30.0–36.0)
MCV: 89.4 fL (ref 80.0–100.0)
Monocytes Absolute: 0.5 10*3/uL (ref 0.1–1.0)
Monocytes Relative: 7 %
Neutro Abs: 4.4 10*3/uL (ref 1.7–7.7)
Neutrophils Relative %: 63 %
Platelets: 308 10*3/uL (ref 150–400)
RBC: 3.41 MIL/uL — ABNORMAL LOW (ref 3.87–5.11)
RDW: 13.6 % (ref 11.5–15.5)
WBC: 7 10*3/uL (ref 4.0–10.5)
nRBC: 0 % (ref 0.0–0.2)

## 2022-11-15 LAB — TROPONIN I (HIGH SENSITIVITY)
Troponin I (High Sensitivity): 25 ng/L — ABNORMAL HIGH (ref <18)
Troponin I (High Sensitivity): 27 ng/L — ABNORMAL HIGH (ref <18)

## 2022-11-15 LAB — LIPASE, BLOOD: Lipase: 59 U/L — ABNORMAL HIGH (ref 11–51)

## 2022-11-15 MED ORDER — ALUM & MAG HYDROXIDE-SIMETH 200-200-20 MG/5ML PO SUSP
30.0000 mL | Freq: Once | ORAL | Status: AC
  Administered 2022-11-15: 30 mL via ORAL
  Filled 2022-11-15: qty 30

## 2022-11-15 MED ORDER — LIDOCAINE VISCOUS HCL 2 % MT SOLN
15.0000 mL | Freq: Once | OROMUCOSAL | Status: AC
  Administered 2022-11-15: 15 mL via ORAL
  Filled 2022-11-15: qty 15

## 2022-11-15 NOTE — ED Notes (Signed)
Pt is in no acute distress at this time.  Vital signs stable.

## 2022-11-15 NOTE — Discharge Instructions (Addendum)
We evaluated you for your chest pain.  Your symptoms are most likely caused by inflammation in your stomach.  Please continue to take your omeprazole.  You can take Maalox, which you can buy over-the-counter for your symptoms as well.  Please follow-up closely with your primary doctor.  We think it is less likely that your pain is related to your heart, but given your medical history would still like you to follow-up closely with a cardiologist.  Return if you develop any worsening symptoms, fevers or chills, severe abdominal pain, worsening chest pain, nausea or vomiting, lightheadedness or dizziness, difficulty breathing, or any other new or concerning symptoms.

## 2022-11-15 NOTE — ED Provider Notes (Signed)
[facility] Provider Note  CSN: [id number] Arrival date & time: 11/15/22 [id number]  Chief Complaint(s) Allergic Reaction (Patient to ED via EMS with complaint of feeling SOB, itching, burning in esophagus, and pressure in her head 3 minutes after self administration of Ozempic 0.25mg )  HPI Teresa Poole is a 72 y.o. female presenting to the emergency department with chest pain.  Per EMS note, patient reported having shortness of breath, itching and burning after administering Ozempic.  Patient reports that rather she is here for chest pain.  She reports that she has chest pain described as a burning sensation in her lower chest and epigastric region.  She denies any nausea or vomiting.  She reports it feels like indigestion.  She reports she had a bunch of seafood last night and it feels similar to indigestion she gets with seafood.  No fevers or chills.  No shortness of breath.  No lightheadedness or dizziness.  No diaphoresis.  No back pain.  No melena, hematochezia, hematemesis.  She reports that she feels better currently.   Past Medical History Past Medical History:  Diagnosis Date   Allergy    Anemia    Carotid stenosis    Chronic combined systolic and diastolic CHF (congestive heart failure) (HCC)    CKD (chronic kidney disease), stage IV ([place]) 09/24/2013   Pt at [facility], Dr. Joelyn Oms   COVID-19 10/24/2021   Diabetes mellitus type 2 in nonobese [facility])    Edema 09/14/2013   GERD (gastroesophageal reflux disease)    History of ovarian cancer    Hyperlipidemia    Hypertension    Mild CAD    non-obstructive by LHC (09/25/2013): Proximal and mid LAD serial 20%, proximal circumflex 30%, mid AV groove circumflex 30%, mid RCA mild plaque.   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 ([place]) 11/07/2022   Acute kidney injury ([place]) 11/05/2022   Dizziness 04/17/2022   (HFpEF) heart failure with preserved ejection fraction ([place]) 02/28/2022   Hypomagnesemia 02/28/2022   Manic episode, unspecified ([place])    COVID-19 virus infection 10/25/2021   UTI (urinary tract infection) 10/25/2021   Anemia 10/25/2021   Suicidal ideations 10/17/2021   Acute renal failure superimposed on stage 4 chronic kidney disease ([place]) 10/14/2021   ARF (acute renal failure) ([place]) 10/14/2021   Small bowel obstruction (HCC)    SBO (small bowel obstruction) ([place]) 09/22/2021   Adjustment disorder with anxiety 06/10/2021   Atypical chest pain 11/27/2018   Renal artery stenosis ([place]) 10/11/2018   Hyperlipidemia associated with type 2 diabetes mellitus ([place]) [id number]   Chronic systolic heart failure ([place]) 09/28/2013   Chronic kidney disease (CKD), stage IV (severe) ([place]) 09/24/2013   Hypertensive urgency 09/18/2013   Cardiomyopathy, hypertensive ([place]) 09/18/2013   Edema [id number]   Acute systolic heart failure ([place]) 09/09/2013   Hypertensive heart disease with CHF (congestive heart failure) ([place]) 09/09/2013   Hypertension associated with diabetes ([place]) 09/07/2013   Type 2 diabetes mellitus with chronic kidney disease, without long-term current use of insulin ([place]) 09/07/2013   Obesity (BMI 30-39.9)    History  of ovarian cancer    Home Medication(s) Prior to Admission medications   Medication Sig Start Date End Date Taking? Authorizing Provider  amLODipine (NORVASC) 10 MG tablet Take 1 tablet (10 mg total) by mouth daily. 11/14/22   Regalado, Jerald Kief A, MD  aspirin EC 81 MG tablet Take 1 tablet (81 mg total) by mouth daily. Swallow whole. 11/14/22   Regalado, Belkys A, MD  carvedilol  (COREG) 25 MG tablet Take 1 tablet (25 mg total) by mouth 2 (two) times daily with a meal. 11/13/22   Regalado, Belkys A, MD  cyanocobalamin 1000 MCG tablet Take 1 tablet (1,000 mcg total) by mouth daily. 11/14/22   Regalado, Belkys A, MD  docusate sodium (COLACE) 100 MG capsule Take 1 capsule (100 mg total) by mouth 2 (two) times daily. 11/13/22   Regalado, Belkys A, MD  famotidine (PEPCID) 40 MG tablet Take 40 mg by mouth daily. 09/04/21   [provider]  glucose blood (ONE TOUCH ULTRA TEST) test strip Use to check blood sugar 3 times daily Dx code E11.65 Patient taking differently: 1 each by Other route See admin instructions. Use to check blood sugar 3 times daily Dx code E11.65 11/20/14   Elayne Snare, MD  iron polysaccharides (NIFEREX) 150 MG capsule Take 1 capsule (150 mg total) by mouth daily. 11/14/22   Regalado, Belkys A, MD  isosorbide mononitrate (IMDUR) 30 MG 24 hr tablet Take 1 tablet (30 mg total) by mouth daily. 11/14/22   Regalado, Belkys A, MD  omeprazole (PRILOSEC) 40 MG capsule Take 40 mg by mouth daily. 03/10/22   [provider]  polyethylene glycol (MIRALAX / GLYCOLAX) 17 g packet Take 17 g by mouth daily. Patient not taking: Reported on 11/05/2022 09/28/21   Eugenie Filler, MD  QUEtiapine (SEROQUEL) 25 MG tablet Take 1 tablet (25 mg total) by mouth 2 (two) times daily as needed ([place]). 11/13/22   Regalado, Belkys A, MD  QUEtiapine (SEROQUEL) 50 MG tablet Take 1 tablet (50 mg total) by mouth at bedtime. 11/13/22   Regalado, Belkys A, MD  rosuvastatin (CRESTOR) 20 MG tablet Take 20 mg by mouth daily.    [provider]  thiamine (VITAMIN B-1) 100 MG tablet Take 1 tablet (100 mg total) by mouth daily. 11/14/22   Regalado, Cassie Freer, MD                                                                                                                                    Past Surgical History Past Surgical History:  Procedure Laterality Date   LEFT HEART  CATHETERIZATION WITH CORONARY ANGIOGRAM N/A 09/25/2013   Procedure: LEFT HEART CATHETERIZATION WITH CORONARY ANGIOGRAM;  Surgeon: Burnell Blanks, MD;  Location: [facility] CATH LAB;  Service: Cardiovascular;  Laterality: N/A;   RENAL ANGIOGRAPHY N/A 10/06/2018   Procedure: RENAL ANGIOGRAPHY;  Surgeon: Marty Heck, MD;  Location: [facility] CV LAB;  Service:  Cardiovascular;  Laterality: N/A;   Family History Family History  Problem Relation Age of Onset   Diabetes Father    Hypertension Father    Hypertension Maternal Grandfather     Social History Social History   Tobacco Use   Smoking status: Never   Smokeless tobacco: Never  Vaping Use   Vaping Use: Never used  Substance Use Topics   Alcohol use: No   Drug use: No   Allergies Hydralazine hcl and Atorvastatin  Review of Systems Review of Systems  All other systems reviewed and are negative.   Physical Exam Vital Signs  I have reviewed the triage vital signs BP (!) 158/67 (BP Location: Right Arm)   Pulse 74   Temp 97.9 F (36.6 C) (Oral)   Resp 20   Ht 5\' 6"  (1.676 m)   Wt 70 kg   SpO2 100%   BMI 24.91 kg/m  Physical Exam Vitals and nursing note reviewed.  Constitutional:      General: She is not in acute distress.    Appearance: She is well-developed.  HENT:     Head: Normocephalic and atraumatic.     Mouth/Throat:     Mouth: Mucous membranes are moist.  Eyes:     Pupils: Pupils are equal, round, and reactive to light.  Cardiovascular:     Rate and Rhythm: Normal rate and regular rhythm.     Heart sounds: No murmur heard. Pulmonary:     Effort: Pulmonary effort is normal. No respiratory distress.     Breath sounds: Normal breath sounds. No wheezing.  Abdominal:     General: Abdomen is flat.     Palpations: Abdomen is soft.     Tenderness: There is no abdominal tenderness.  Musculoskeletal:        General: No tenderness.     Right lower leg: No edema.     Left lower leg: No edema.  Skin:     General: Skin is warm and dry.  Neurological:     General: No focal deficit present.     Mental Status: She is alert. Mental status is at baseline.  Psychiatric:        Mood and Affect: Mood normal.        Behavior: Behavior normal.     ED Results and Treatments Labs (all labs ordered are listed, but only abnormal results are displayed) Labs Reviewed  COMPREHENSIVE METABOLIC PANEL - Abnormal; Notable for the following components:      Result Value   Glucose, Bld 177 (*)    BUN 74 (*)    Creatinine, Ser 4.54 (*)    Albumin 3.3 (*)    GFR, Estimated 10 (*)    All other components within normal limits  CBC WITH DIFFERENTIAL/PLATELET - Abnormal; Notable for the following components:   RBC 3.41 (*)    Hemoglobin 9.8 (*)    HCT 30.5 (*)    All other components within normal limits  LIPASE, BLOOD - Abnormal; Notable for the following components:   Lipase 59 (*)    All other components within normal limits  TROPONIN I (HIGH SENSITIVITY) - Abnormal; Notable for the following components:   Troponin I (High Sensitivity) 25 (*)    All other components within normal limits  TROPONIN I (HIGH SENSITIVITY) - Abnormal; Notable for the following components:   Troponin I (High Sensitivity) 27 (*)    All other components within normal limits  Radiology DG Chest Port 1 View  Result Date: 11/15/2022 CLINICAL DATA:  Chest pain EXAM: PORTABLE CHEST 1 VIEW COMPARISON:  11/04/2022 FINDINGS: The lungs are clear without focal pneumonia, edema, pneumothorax or pleural effusion. The cardiopericardial silhouette is within normal limits for size. The visualized bony structures of the thorax are unremarkable. Telemetry leads overlie the chest. IMPRESSION: No active disease. Electronically Signed   By: Misty Stanley M.D.   On: 11/15/2022 08:35    Pertinent labs & imaging results that  were available during my care of the patient were reviewed by me and considered in my medical decision making (see MDM for details).  Medications Ordered in ED Medications  alum & mag hydroxide-simeth (MAALOX/MYLANTA) 200-200-20 MG/5ML suspension 30 mL (30 mLs Oral Given 11/15/22 [id number])    And  lidocaine (XYLOCAINE) 2 % viscous mouth solution 15 mL (15 mLs Oral Given 11/15/22 [id number])                                                                                                                                     Procedures Procedures  (including critical care time)  Medical Decision Making / ED Course   MDM:  73 year old female presenting to the emergency department with chest pain.  Patient well-appearing, apparently concern for allergic reaction although patient denies this, reports that she is here for her chest pain.  Physical exam reassuring, no focal abnormality noted.  She reports her symptoms have improved without treatment.  Differential includes gastritis/GERD, given quality of symptoms.  Her lipase is very mildly elevated but no epigastric tenderness to suggest pancreatitis.  Less likely ACS given atypical symptoms but will check troponin x 2.  Doubt dissection, pneumothorax, pneumonia but will check chest x-ray, symptoms inconsistent.  Will trial GI cocktail.  Will reassess.  Clinical Course as of 11/15/22 1244  Sun Nov 15, 2022  1117 I discussed the EMS report of allergic reaction additionally with the patient.  She reports that she takes Ozempic all the time without any reaction.  She thinks that EMS suggest that she had an allergic reaction since she was holding Ozempic when they came to pick her up.  Additionally, she reports that Maalox has almost completely resolved her symptoms. [WS]  1242 Patient reports that her chest pain improved significantly after Maalox.  Given history, very low concern for ACS.  Her troponin is stable and actually lower than previous checks.  Will  still refer to cardiology given risk factors.  Discussed strict return precautions. Will discharge patient to home. All questions answered. Patient comfortable with plan of discharge. Return precautions discussed with patient and specified on the after visit summary.  [WS]    Clinical Course User Index [WS] Cristie Hem, MD     Additional history obtained: -Additional history obtained from ems -External records from outside source obtained and reviewed including: Chart review including previous notes, labs, imaging, consultation notes including echo  11/06/22    Lab Tests: -I ordered, reviewed, and interpreted labs.   The pertinent results include:   Labs Reviewed  COMPREHENSIVE METABOLIC PANEL - Abnormal; Notable for the following components:      Result Value   Glucose, Bld 177 (*)    BUN 74 (*)    Creatinine, Ser 4.54 (*)    Albumin 3.3 (*)    GFR, Estimated 10 (*)    All other components within normal limits  CBC WITH DIFFERENTIAL/PLATELET - Abnormal; Notable for the following components:   RBC 3.41 (*)    Hemoglobin 9.8 (*)    HCT 30.5 (*)    All other components within normal limits  LIPASE, BLOOD - Abnormal; Notable for the following components:   Lipase 59 (*)    All other components within normal limits  TROPONIN I (HIGH SENSITIVITY) - Abnormal; Notable for the following components:   Troponin I (High Sensitivity) 25 (*)    All other components within normal limits  TROPONIN I (HIGH SENSITIVITY) - Abnormal; Notable for the following components:   Troponin I (High Sensitivity) 27 (*)    All other components within normal limits    Notable for stable mild troponin elevation due to CKD  EKG   EKG Interpretation  Date/Time:  Sunday November 15 2022 08:20:53 EDT Ventricular Rate:  72 PR Interval:  202 QRS Duration: 168 QT Interval:  471 QTC Calculation: 516 R Axis:   -32 Text Interpretation: Sinus rhythm Right bundle branch block Probable left ventricular  hypertrophy Prolonged QT interval No significant change since last tracing Confirmed by Garnette Gunner [phone]) on 11/15/2022 10:12:44 AM         Imaging Studies ordered: I ordered imaging studies including CXR On my interpretation imaging demonstrates no acute process I independently visualized and interpreted imaging. I agree with the radiologist interpretation   Medicines ordered and prescription drug management: Meds ordered this encounter  Medications   AND Linked Order Group    alum & mag hydroxide-simeth (MAALOX/MYLANTA) 200-200-20 MG/5ML suspension 30 mL    lidocaine (XYLOCAINE) 2 % viscous mouth solution 15 mL    -I have reviewed the patients home medicines and have made adjustments as needed   Cardiac Monitoring: The patient was maintained on a cardiac monitor.  I personally viewed and interpreted the cardiac monitored which showed an underlying rhythm of: NSR  Social Determinants of Health:  Diagnosis or treatment significantly limited by social determinants of health: lives alone   Reevaluation: After the interventions noted above, I reevaluated the patient and found that their symptoms have improved  Co morbidities that complicate the patient evaluation  Past Medical History:  Diagnosis Date   Allergy    Anemia    Carotid stenosis    Chronic combined systolic and diastolic CHF (congestive heart failure) ([place])    CKD (chronic kidney disease), stage IV ([place]) 09/24/2013   Pt at [facility], Dr. Joelyn Oms   COVID-19 10/24/2021   Diabetes mellitus type 2 in nonobese [facility])    Edema 09/14/2013   GERD (gastroesophageal reflux disease)    History of ovarian cancer    Hyperlipidemia    Hypertension    Mild CAD    non-obstructive by LHC (09/25/2013): Proximal and mid LAD serial 20%, proximal circumflex 30%, mid AV groove circumflex 30%, mid RCA mild plaque.   NICM (nonischemic cardiomyopathy) (HCC)    Obesity (BMI 30-39.9)    RBBB    Renal artery stenosis  ([place]) 10/11/2018   Thyroid  disease    Seen by specialist      Dispostion: Disposition decision including need for hospitalization was considered, and patient discharged from emergency department.    Final Clinical Impression(s) / ED Diagnoses Final diagnoses:  Atypical chest pain     This chart was dictated using voice recognition software.  Despite best efforts to proofread,  errors can occur which can change the documentation meaning.    Cristie Hem, MD 11/15/22 [phone]

## 2022-11-15 NOTE — ED Notes (Signed)
EMS reports giving patient 50 mg PO benadryl

## 2022-11-29 ENCOUNTER — Emergency Department (HOSPITAL_COMMUNITY): Payer: Medicare PPO

## 2022-11-29 ENCOUNTER — Other Ambulatory Visit: Payer: Self-pay

## 2022-11-29 ENCOUNTER — Encounter (HOSPITAL_COMMUNITY): Payer: Self-pay

## 2022-11-29 ENCOUNTER — Emergency Department (HOSPITAL_COMMUNITY)
Admission: EM | Admit: 2022-11-29 | Discharge: 2022-11-29 | Disposition: A | Discharge status: Home / Self Care | Payer: Medicare PPO | Attending: Emergency Medicine | Admitting: Emergency Medicine

## 2022-11-29 DIAGNOSIS — Z79899 Other long term (current) drug therapy: Secondary | ICD-10-CM | POA: Insufficient documentation

## 2022-11-29 DIAGNOSIS — R0789 Other chest pain: Secondary | ICD-10-CM | POA: Diagnosis not present

## 2022-11-29 DIAGNOSIS — E1122 Type 2 diabetes mellitus with diabetic chronic kidney disease: Secondary | ICD-10-CM | POA: Diagnosis not present

## 2022-11-29 DIAGNOSIS — Z7982 Long term (current) use of aspirin: Secondary | ICD-10-CM | POA: Diagnosis not present

## 2022-11-29 DIAGNOSIS — R1013 Epigastric pain: Secondary | ICD-10-CM

## 2022-11-29 DIAGNOSIS — I130 Hypertensive heart and chronic kidney disease with heart failure and stage 1 through stage 4 chronic kidney disease, or unspecified chronic kidney disease: Secondary | ICD-10-CM | POA: Diagnosis not present

## 2022-11-29 DIAGNOSIS — N189 Chronic kidney disease, unspecified: Secondary | ICD-10-CM | POA: Diagnosis not present

## 2022-11-29 DIAGNOSIS — K30 Functional dyspepsia: Secondary | ICD-10-CM | POA: Diagnosis present

## 2022-11-29 DIAGNOSIS — I509 Heart failure, unspecified: Secondary | ICD-10-CM | POA: Insufficient documentation

## 2022-11-29 LAB — CBC
HCT: 29.8 % — ABNORMAL LOW (ref 36.0–46.0)
Hemoglobin: 9.1 g/dL — ABNORMAL LOW (ref 12.0–15.0)
MCH: 27.8 pg (ref 26.0–34.0)
MCHC: 30.5 g/dL (ref 30.0–36.0)
MCV: 91.1 fL (ref 80.0–100.0)
Platelets: 258 10*3/uL (ref 150–400)
RBC: 3.27 MIL/uL — ABNORMAL LOW (ref 3.87–5.11)
RDW: 13.2 % (ref 11.5–15.5)
WBC: 5.6 10*3/uL (ref 4.0–10.5)
nRBC: 0 % (ref 0.0–0.2)

## 2022-11-29 LAB — COMPREHENSIVE METABOLIC PANEL
ALT: 11 U/L (ref 0–44)
AST: 16 U/L (ref 15–41)
Albumin: 3 g/dL — ABNORMAL LOW (ref 3.5–5.0)
Alkaline Phosphatase: 51 U/L (ref 38–126)
Anion gap: 8 (ref 5–15)
BUN: 41 mg/dL — ABNORMAL HIGH (ref 8–23)
CO2: 24 mmol/L (ref 22–32)
Calcium: 8.6 mg/dL — ABNORMAL LOW (ref 8.9–10.3)
Chloride: 106 mmol/L (ref 98–111)
Creatinine, Ser: 3.72 mg/dL — ABNORMAL HIGH (ref 0.44–1.00)
GFR, Estimated: 13 mL/min — ABNORMAL LOW (ref >60)
Glucose, Bld: 151 mg/dL — ABNORMAL HIGH (ref 70–99)
Potassium: 4.3 mmol/L (ref 3.5–5.1)
Sodium: 138 mmol/L (ref 135–145)
Total Bilirubin: 0.4 mg/dL (ref 0.3–1.2)
Total Protein: 6.3 g/dL — ABNORMAL LOW (ref 6.5–8.1)

## 2022-11-29 LAB — LIPASE, BLOOD: Lipase: 44 U/L (ref 11–51)

## 2022-11-29 LAB — TROPONIN I (HIGH SENSITIVITY)
Troponin I (High Sensitivity): 20 ng/L — ABNORMAL HIGH (ref <18)
Troponin I (High Sensitivity): 19 ng/L — ABNORMAL HIGH (ref <18)

## 2022-11-29 MED ORDER — ONDANSETRON HCL 4 MG/2ML IJ SOLN
4.0000 mg | Freq: Once | INTRAMUSCULAR | Status: AC
  Administered 2022-11-29: 4 mg via INTRAVENOUS
  Filled 2022-11-29: qty 2

## 2022-11-29 MED ORDER — LIDOCAINE VISCOUS HCL 2 % MT SOLN
15.0000 mL | Freq: Once | OROMUCOSAL | Status: AC
  Administered 2022-11-29: 15 mL via ORAL
  Filled 2022-11-29: qty 15

## 2022-11-29 MED ORDER — ALUM & MAG HYDROXIDE-SIMETH 200-200-20 MG/5ML PO SUSP
30.0000 mL | Freq: Once | ORAL | Status: AC
  Administered 2022-11-29: 30 mL via ORAL
  Filled 2022-11-29: qty 30

## 2022-11-29 MED ORDER — SUCRALFATE 1 G PO TABS
1.0000 g | ORAL_TABLET | Freq: Once | ORAL | Status: AC
  Administered 2022-11-29: 1 g via ORAL
  Filled 2022-11-29: qty 1

## 2022-11-29 MED ORDER — FENTANYL CITRATE PF 50 MCG/ML IJ SOSY
25.0000 ug | PREFILLED_SYRINGE | Freq: Once | INTRAMUSCULAR | Status: AC
  Administered 2022-11-29: 25 ug via INTRAVENOUS
  Filled 2022-11-29: qty 1

## 2022-11-29 MED ORDER — PANTOPRAZOLE SODIUM 20 MG PO TBEC: 20.0000 mg | DELAYED_RELEASE_TABLET | Freq: Every day | ORAL | 3 refills | Status: DC

## 2022-11-29 MED ORDER — FAMOTIDINE IN NACL 20-0.9 MG/50ML-% IV SOLN
20.0000 mg | Freq: Once | INTRAVENOUS | Status: AC
  Administered 2022-11-29: 20 mg via INTRAVENOUS
  Filled 2022-11-29: qty 50

## 2022-11-29 MED ORDER — SUCRALFATE 1 G PO TABS: 1.0000 g | ORAL_TABLET | Freq: Three times a day (TID) | ORAL | 0 refills | Status: DC

## 2022-11-29 NOTE — ED Triage Notes (Signed)
Pt arrived POV from home stating everything from her eyes and nose all the way down through her GI tract to her rectum is on fire and is burning. Pt states even her tears are burning.

## 2022-11-29 NOTE — Discharge Instructions (Addendum)
You have been seen today for your complaint of chest pressure, burning. Your lab work  was reassuring. Your imaging was reassuring and showed no abnormalities. Your discharge medications include Protonix.  This is a medicine that you should take once per day until you can follow-up with gastroenterology. Carafate.  This is a medicine that you should take as prescribed and for the entire duration of the prescription. Home care instructions are as follows:  Avoid spicy, acidic or fried foods.  Sit straight up for at least 30 minutes after eating Follow up with: [facility] gastroenterology.  You should call the phone number listed in this packet tomorrow to schedule an appointment for an ED follow-up visit. Please seek immediate medical care if you develop any of the following symptoms: Your chest pain is worse. You have a cough that gets worse, or you cough up blood. You have very bad (severe) pain in your belly (abdomen). You pass out (faint). You have either of these for no clear reason: Sudden chest discomfort. Sudden discomfort in your arms, back, neck, or jaw. You have shortness of breath at any time. You suddenly start to sweat, or your skin gets clammy. You feel sick to your stomach (nauseous). You throw up (vomit). You suddenly feel lightheaded or dizzy. You feel very weak or tired. Your heart starts to beat fast, or it feels like it is skipping beats. At this time there does not appear to be the presence of an emergent medical condition, however there is always the potential for conditions to change. Please read and follow the below instructions.  Do not take your medicine if  develop an itchy rash, swelling in your mouth or lips, or difficulty breathing; call 911 and seek immediate emergency medical attention if this occurs.  You may review your lab tests and imaging results in their entirety on your MyChart account.  Please discuss all results of fully with your primary care provider  and other specialist at your follow-up visit.  Note: Portions of this text may have been transcribed using voice recognition software. Every effort was made to ensure accuracy; however, inadvertent computerized transcription errors may still be present.

## 2022-11-29 NOTE — ED Provider Notes (Signed)
[place] EMERGENCY DEPARTMENT AT [facility] Provider Note   CSN: [id number] Arrival date & time: 11/29/22  1442     History  Chief Complaint  Patient presents with   Gastroesophageal Reflux    Teresa Poole is a 72 y.o. female.  With a history of hypertension, CKD, type 2 diabetes, CHF, carotid stenosis who presents to the ED for evaluation of pain.  She states she went to church this morning and was feeling fine.  She then went to [employer] and ate a fish sandwich.  She then went out to her car and slept for 45 minutes.  When she woke up, she noticed a burning sensation in her mouth that she reports extends through her entire GI tract.  She took Maalox this morning prior to church but has not taken anything since the symptoms began.  She states she typically does have fairly severe burning pain in her esophagus when she wakes up every single morning.  She drove herself here for further evaluation because she states that this is more severe than it typically is.  She states her tears are burning as well.  She has not been exposed to any new foods or foods that she is allergic to.  She does not report any new medications.  She denies shortness of breath, cough, nausea, vomiting, diaphoresis, dizziness, lightheadedness.  She is scheduled to follow-up with cardiology in approximately 2 months.   Gastroesophageal Reflux Associated symptoms include chest pain.       Home Medications Prior to Admission medications   Medication Sig Start Date End Date Taking? Authorizing Provider  pantoprazole (PROTONIX) 20 MG tablet Take 1 tablet (20 mg total) by mouth daily. 11/29/22 03/29/23 Yes Arley Salamone, Edsel Petrin, PA-C  sucralfate (CARAFATE) 1 g tablet Take 1 tablet (1 g total) by mouth 4 (four) times daily -  with meals and at bedtime for 5 days. 11/29/22 12/04/22 Yes Tacha Manni, Edsel Petrin, PA-C  amLODipine (NORVASC) 10 MG tablet Take 1 tablet (10 mg total) by mouth daily. 11/14/22   Regalado,  Jon Billings A, MD  aspirin EC 81 MG tablet Take 1 tablet (81 mg total) by mouth daily. Swallow whole. 11/14/22   Regalado, Belkys A, MD  carvedilol (COREG) 25 MG tablet Take 1 tablet (25 mg total) by mouth 2 (two) times daily with a meal. 11/13/22   Regalado, Belkys A, MD  cyanocobalamin 1000 MCG tablet Take 1 tablet (1,000 mcg total) by mouth daily. 11/14/22   Regalado, Belkys A, MD  docusate sodium (COLACE) 100 MG capsule Take 1 capsule (100 mg total) by mouth 2 (two) times daily. 11/13/22   Regalado, Belkys A, MD  famotidine (PEPCID) 40 MG tablet Take 40 mg by mouth daily. 09/04/21   [provider]  glucose blood (ONE TOUCH ULTRA TEST) test strip Use to check blood sugar 3 times daily Dx code E11.65 Patient taking differently: 1 each by Other route See admin instructions. Use to check blood sugar 3 times daily Dx code E11.65 11/20/14   Reather Littler, MD  iron polysaccharides (NIFEREX) 150 MG capsule Take 1 capsule (150 mg total) by mouth daily. 11/14/22   Regalado, Belkys A, MD  isosorbide mononitrate (IMDUR) 30 MG 24 hr tablet Take 1 tablet (30 mg total) by mouth daily. 11/14/22   Regalado, Belkys A, MD  omeprazole (PRILOSEC) 40 MG capsule Take 40 mg by mouth daily. 03/10/22   [provider]  polyethylene glycol (MIRALAX / GLYCOLAX) 17 g packet Take 17  g by mouth daily. Patient not taking: Reported on 11/05/2022 09/28/21   Rodolph Bong, MD  QUEtiapine (SEROQUEL) 25 MG tablet Take 1 tablet (25 mg total) by mouth 2 (two) times daily as needed (Agitation). 11/13/22   Regalado, Belkys A, MD  QUEtiapine (SEROQUEL) 50 MG tablet Take 1 tablet (50 mg total) by mouth at bedtime. 11/13/22   Regalado, Belkys A, MD  rosuvastatin (CRESTOR) 20 MG tablet Take 20 mg by mouth daily.    [provider]  thiamine (VITAMIN B-1) 100 MG tablet Take 1 tablet (100 mg total) by mouth daily. 11/14/22   Regalado, Jon Billings A, MD      Allergies    Hydralazine hcl and Atorvastatin    Review of Systems    Review of Systems  Cardiovascular:  Positive for chest pain.  All other systems reviewed and are negative.   Physical Exam Updated Vital Signs BP (!) 165/64   Pulse 64   Temp 98.6 F (37 C) (Oral)   Resp 18   Ht 5\' 7"  (1.702 m)   Wt 70.3 kg   SpO2 97%   BMI 24.28 kg/m  Physical Exam Vitals and nursing note reviewed.  Constitutional:      General: She is not in acute distress.    Appearance: Normal appearance. She is well-developed. She is not ill-appearing, toxic-appearing or diaphoretic.     Comments: Resting comfortably in bed  HENT:     Head: Normocephalic and atraumatic.  Eyes:     Conjunctiva/sclera: Conjunctivae normal.  Cardiovascular:     Rate and Rhythm: Normal rate and regular rhythm.     Heart sounds: Murmur heard.     Comments: No chest wall TTP.  No rashes Pulmonary:     Effort: Pulmonary effort is normal. No respiratory distress.     Breath sounds: Normal breath sounds. No stridor. No wheezing, rhonchi or rales.  Abdominal:     Palpations: Abdomen is soft.     Tenderness: There is no abdominal tenderness. There is no guarding.  Musculoskeletal:        General: No swelling.     Cervical back: Neck supple.     Comments: 1+ pitting edema to bilateral ankles   Skin:    General: Skin is warm and dry.     Capillary Refill: Capillary refill takes less than 2 seconds.  Neurological:     General: No focal deficit present.     Mental Status: She is alert and oriented to person, place, and time.     Comments: Occasional stutter  Psychiatric:        Mood and Affect: Mood normal.     ED Results / Procedures / Treatments   Labs (all labs ordered are listed, but only abnormal results are displayed) Labs Reviewed  CBC - Abnormal; Notable for the following components:      Result Value   RBC 3.27 (*)    Hemoglobin 9.1 (*)    HCT 29.8 (*)    All other components within normal limits  COMPREHENSIVE METABOLIC PANEL - Abnormal; Notable for the following  components:   Glucose, Bld 151 (*)    BUN 41 (*)    Creatinine, Ser 3.72 (*)    Calcium 8.6 (*)    Total Protein 6.3 (*)    Albumin 3.0 (*)    GFR, Estimated 13 (*)    All other components within normal limits  TROPONIN I (HIGH SENSITIVITY) - Abnormal; Notable for the following components:  Troponin I (High Sensitivity) 20 (*)    All other components within normal limits  TROPONIN I (HIGH SENSITIVITY) - Abnormal; Notable for the following components:   Troponin I (High Sensitivity) 19 (*)    All other components within normal limits  LIPASE, BLOOD    EKG EKG Interpretation  Date/Time:  Sunday November 29 2022 14:53:04 EDT Ventricular Rate:  70 PR Interval:  200 QRS Duration: 165 QT Interval:  435 QTC Calculation: 470 R Axis:   0 Text Interpretation: Sinus rhythm Right bundle branch block No significant change since prior 3/24 Confirmed by Meridee ScoreButler, Michael [phone]([id number]) on 11/29/2022 2:55:13 PM  Radiology DG Chest Port 1 View  Result Date: 11/29/2022 CLINICAL DATA:  Chest pain EXAM: PORTABLE CHEST 1 VIEW COMPARISON:  Chest x-ray dated November 15, 2022 FINDINGS: The heart size and mediastinal contours are within normal limits. Both lungs are clear. The visualized skeletal structures are unremarkable. IMPRESSION: No active disease. Electronically Signed   By: Allegra LaiLeah  Strickland M.D.   On: 11/29/2022 15:41    Procedures Procedures    Medications Ordered in ED Medications  alum & mag hydroxide-simeth (MAALOX/MYLANTA) 200-200-20 MG/5ML suspension 30 mL (30 mLs Oral Given 11/29/22 1508)    And  lidocaine (XYLOCAINE) 2 % viscous mouth solution 15 mL (15 mLs Oral Given 11/29/22 1508)  famotidine (PEPCID) IVPB 20 mg premix (0 mg Intravenous Stopped 11/29/22 1652)  fentaNYL (SUBLIMAZE) injection 25 mcg (25 mcg Intravenous Given 11/29/22 1714)  sucralfate (CARAFATE) tablet 1 g (1 g Oral Given 11/29/22 1710)  ondansetron (ZOFRAN) injection 4 mg (4 mg Intravenous Given 11/29/22 1742)    ED Course/ Medical  Decision Making/ A&P Clinical Course as of 11/29/22 1853  Sun Nov 29, 2022  1757 Patient reporting significant improvement in her symptoms [AS]    Clinical Course User Index [AS] Michelle PiperSchutt, Jrue Jarriel M, PA-C             HEART Score: 4                Medical Decision Making Amount and/or Complexity of Data Reviewed Labs: ordered. Radiology: ordered.  Risk OTC drugs. Prescription drug management.  This patient presents to the ED for concern of chest pain, this involves an extensive number of treatment options, and is a complaint that carries with it a high risk of complications and morbidity.  The emergent differential diagnosis of chest pain includes: Acute coronary syndrome, pericarditis, aortic dissection, pulmonary embolism, tension pneumothorax, and esophageal rupture.  I do not believe the patient has an emergent cause of chest pain, other urgent/non-acute considerations include, but are not limited to: chronic angina, aortic stenosis, cardiomyopathy, myocarditis, mitral valve prolapse, pulmonary hypertension, hypertrophic obstructive cardiomyopathy (HOCM), aortic insufficiency, right ventricular hypertrophy, pneumonia, pleuritis, bronchitis, pneumothorax, tumor, gastroesophageal reflux disease (GERD), esophageal spasm, Mallory-Weiss syndrome, peptic ulcer disease, biliary disease, pancreatitis, functional gastrointestinal pain, cervical or thoracic disk disease or arthritis, shoulder arthritis, costochondritis, subacromial bursitis, anxiety or panic attack, herpes zoster, breast disorders, chest wall tumors, thoracic outlet syndrome, mediastinitis.   Co morbidities that complicate the patient evaluation  hypertension, CKD, type 2 diabetes, CHF, carotid stenosis  My initial workup includes ACS rule out, GI cocktail  Additional history obtained from: Nursing notes from this visit. Previous records within EMR system ED visit for similar on 11/15/2022  I ordered, reviewed and  interpreted labs which include: CMP, CBC, lipase, troponin, delta troponin.  Creatinine 3.72 and BUN of 41 which is improved from previous readings.  Stable anemia with hemoglobin  of 9.1.  Initial troponin elevated to 20 with delta at 19 which is an improvement from previous readings.  I ordered imaging studies including chest x-ray I independently visualized and interpreted imaging which showed normal I agree with the radiologist interpretation  Cardiac Monitoring:  The patient was maintained on a cardiac monitor.  I personally viewed and interpreted the cardiac monitored which showed an underlying rhythm of: NSR with RBBB  Afebrile, hypertensive but otherwise hemodynamically stable.  72 year old female presenting to the ED for evaluation of burning throughout her GI tract.  This appears to be chronic issue for her.  She wakes up with this every morning.  She has never been to gastroenterology regarding this, she has just been taking Maalox and Mylanta when she does have symptoms.  She developed symptoms shortly after eating [employer] today.  She appears very well on exam and is resting comfortably in bed.  She is not in any acute distress.  She has no abdominal TTP.  She did have some chest pressure initially which was described as feeling like she had to burp.  This is improved after medications.  Her initial and delta troponins are both improved over her baseline, however is slightly elevated.  Her EKG is without acute ischemic changes.  Chest x-ray is reassuring.  She does have a follow-up with cardiology in 2 months.  She was encouraged to call to schedule this sooner if possible.  She was also encouraged to follow-up with gastroenterology and she was given contact information.  She will be sent home with prescriptions for Protonix and Carafate for dyspepsia.  I have low suspicion for ACS at this time although she does have a heart score of 4 due to age and risk factors.  She was given strict return  precautions and verbalizes understanding. Stable at discharge.  At this time there does not appear to be any evidence of an acute emergency medical condition and the patient appears stable for discharge with appropriate outpatient follow up. Diagnosis was discussed with patient who verbalizes understanding of care plan and is agreeable to discharge. I have discussed return precautions with patient who verbalizes understanding. Patient encouraged to follow-up with their PCP within 1 week, cardiology as soon as possible, gastroenterology as soon as possible. All questions answered.  Note: Portions of this report may have been transcribed using voice recognition software. Every effort was made to ensure accuracy; however, inadvertent computerized transcription errors may still be present.        Final Clinical Impression(s) / ED Diagnoses Final diagnoses:  Atypical chest pain  Dyspepsia    Rx / DC Orders ED Discharge Orders          Ordered    pantoprazole (PROTONIX) 20 MG tablet  Daily        11/29/22 1843    sucralfate (CARAFATE) 1 g tablet  3 times daily with meals & bedtime        11/29/22 1843              Michelle Piper, PA-C 11/29/22 1853    Terrilee Files, MD 11/30/22 [phone]

## 2022-12-04 ENCOUNTER — Emergency Department (HOSPITAL_COMMUNITY): Payer: Medicare PPO

## 2022-12-04 ENCOUNTER — Emergency Department (HOSPITAL_COMMUNITY)
Admission: EM | Admit: 2022-12-04 | Discharge: 2022-12-04 | Disposition: A | Discharge status: Home / Self Care | Payer: Medicare PPO | Attending: Emergency Medicine | Admitting: Emergency Medicine

## 2022-12-04 ENCOUNTER — Encounter (HOSPITAL_COMMUNITY): Payer: Self-pay

## 2022-12-04 ENCOUNTER — Other Ambulatory Visit: Payer: Self-pay

## 2022-12-04 DIAGNOSIS — Z79899 Other long term (current) drug therapy: Secondary | ICD-10-CM | POA: Diagnosis not present

## 2022-12-04 DIAGNOSIS — I129 Hypertensive chronic kidney disease with stage 1 through stage 4 chronic kidney disease, or unspecified chronic kidney disease: Secondary | ICD-10-CM | POA: Insufficient documentation

## 2022-12-04 DIAGNOSIS — I2510 Atherosclerotic heart disease of native coronary artery without angina pectoris: Secondary | ICD-10-CM | POA: Insufficient documentation

## 2022-12-04 DIAGNOSIS — N189 Chronic kidney disease, unspecified: Secondary | ICD-10-CM | POA: Insufficient documentation

## 2022-12-04 DIAGNOSIS — J069 Acute upper respiratory infection, unspecified: Secondary | ICD-10-CM

## 2022-12-04 DIAGNOSIS — Z7982 Long term (current) use of aspirin: Secondary | ICD-10-CM | POA: Insufficient documentation

## 2022-12-04 DIAGNOSIS — E1122 Type 2 diabetes mellitus with diabetic chronic kidney disease: Secondary | ICD-10-CM | POA: Insufficient documentation

## 2022-12-04 DIAGNOSIS — R0981 Nasal congestion: Secondary | ICD-10-CM | POA: Diagnosis present

## 2022-12-04 LAB — BASIC METABOLIC PANEL
Anion gap: 11 (ref 5–15)
BUN: 50 mg/dL — ABNORMAL HIGH (ref 8–23)
CO2: 20 mmol/L — ABNORMAL LOW (ref 22–32)
Calcium: 8.7 mg/dL — ABNORMAL LOW (ref 8.9–10.3)
Chloride: 111 mmol/L (ref 98–111)
Creatinine, Ser: 3.8 mg/dL — ABNORMAL HIGH (ref 0.44–1.00)
GFR, Estimated: 12 mL/min — ABNORMAL LOW (ref >60)
Glucose, Bld: 127 mg/dL — ABNORMAL HIGH (ref 70–99)
Potassium: 4.8 mmol/L (ref 3.5–5.1)
Sodium: 142 mmol/L (ref 135–145)

## 2022-12-04 LAB — CBC
HCT: 30.6 % — ABNORMAL LOW (ref 36.0–46.0)
Hemoglobin: 9.3 g/dL — ABNORMAL LOW (ref 12.0–15.0)
MCH: 28.6 pg (ref 26.0–34.0)
MCHC: 30.4 g/dL (ref 30.0–36.0)
MCV: 94.2 fL (ref 80.0–100.0)
Platelets: 236 10*3/uL (ref 150–400)
RBC: 3.25 MIL/uL — ABNORMAL LOW (ref 3.87–5.11)
RDW: 13.2 % (ref 11.5–15.5)
WBC: 5.1 10*3/uL (ref 4.0–10.5)
nRBC: 0 % (ref 0.0–0.2)

## 2022-12-04 LAB — TROPONIN I (HIGH SENSITIVITY)
Troponin I (High Sensitivity): 18 ng/L — ABNORMAL HIGH (ref <18)
Troponin I (High Sensitivity): 18 ng/L — ABNORMAL HIGH (ref <18)

## 2022-12-04 MED ORDER — LORATADINE 10 MG PO TABS: 10.0000 mg | ORAL_TABLET | Freq: Every day | ORAL | 0 refills | Status: DC

## 2022-12-04 NOTE — ED Provider Notes (Signed)
Teresa Poole Provider Note   CSN: [id number] Arrival date & time: 12/04/22  1241     History  Chief Complaint  Patient presents with   Congestion    Teresa Poole is a 72 y.o. female.  The history is provided by the patient.  URI Presenting symptoms: congestion   Severity:  Mild Onset quality:  Gradual Timing:  Constant Progression:  Unchanged Chronicity:  New Relieved by:  Nothing Worsened by:  Nothing Associated symptoms: sinus pain   Associated symptoms: no arthralgias, no headaches, no myalgias, no neck pain, no sneezing, no swollen glands and no wheezing   Risk factors: diabetes mellitus   Risk factors comment:  Cad      Home Medications Prior to Admission medications   Medication Sig Start Date End Date Taking? Authorizing Provider  loratadine (CLARITIN) 10 MG tablet Take 1 tablet (10 mg total) by mouth daily. 12/04/22 01/03/23 Yes Ennis Heavner, DO  amLODipine (NORVASC) 10 MG tablet Take 1 tablet (10 mg total) by mouth daily. 11/14/22   Regalado, Jon Billings A, MD  aspirin EC 81 MG tablet Take 1 tablet (81 mg total) by mouth daily. Swallow whole. 11/14/22   Regalado, Belkys A, MD  carvedilol (COREG) 25 MG tablet Take 1 tablet (25 mg total) by mouth 2 (two) times daily with a meal. 11/13/22   Regalado, Belkys A, MD  cyanocobalamin 1000 MCG tablet Take 1 tablet (1,000 mcg total) by mouth daily. 11/14/22   Regalado, Belkys A, MD  docusate sodium (COLACE) 100 MG capsule Take 1 capsule (100 mg total) by mouth 2 (two) times daily. 11/13/22   Regalado, Belkys A, MD  famotidine (PEPCID) 40 MG tablet Take 40 mg by mouth daily. 09/04/21   [provider]  glucose blood (ONE TOUCH ULTRA TEST) test strip Use to check blood sugar 3 times daily Dx code E11.65 Patient taking differently: 1 each by Other route See admin instructions. Use to check blood sugar 3 times daily Dx code E11.65 11/20/14   Reather Littler, MD  iron polysaccharides  (NIFEREX) 150 MG capsule Take 1 capsule (150 mg total) by mouth daily. 11/14/22   Regalado, Belkys A, MD  isosorbide mononitrate (IMDUR) 30 MG 24 hr tablet Take 1 tablet (30 mg total) by mouth daily. 11/14/22   Regalado, Belkys A, MD  omeprazole (PRILOSEC) 40 MG capsule Take 40 mg by mouth daily. 03/10/22   [provider]  pantoprazole (PROTONIX) 20 MG tablet Take 1 tablet (20 mg total) by mouth daily. 11/29/22 03/29/23  Schutt, Edsel Petrin, PA-C  polyethylene glycol (MIRALAX / GLYCOLAX) 17 g packet Take 17 g by mouth daily. Patient not taking: Reported on 11/05/2022 09/28/21   Rodolph Bong, MD  QUEtiapine (SEROQUEL) 25 MG tablet Take 1 tablet (25 mg total) by mouth 2 (two) times daily as needed (Agitation). 11/13/22   Regalado, Belkys A, MD  QUEtiapine (SEROQUEL) 50 MG tablet Take 1 tablet (50 mg total) by mouth at bedtime. 11/13/22   Regalado, Belkys A, MD  rosuvastatin (CRESTOR) 20 MG tablet Take 20 mg by mouth daily.    [provider]  sucralfate (CARAFATE) 1 g tablet Take 1 tablet (1 g total) by mouth 4 (four) times daily -  with meals and at bedtime for 5 days. 11/29/22 12/04/22  Schutt, Edsel Petrin, PA-C  thiamine (VITAMIN B-1) 100 MG tablet Take 1 tablet (100 mg total) by mouth daily. 11/14/22   Regalado, Prentiss Bells, MD  Allergies    Hydralazine hcl and Atorvastatin    Review of Systems   Review of Systems  HENT:  Positive for congestion and sinus pain. Negative for sneezing.   Respiratory:  Negative for wheezing.   Musculoskeletal:  Negative for arthralgias, myalgias and neck pain.  Neurological:  Negative for headaches.    Physical Exam Updated Vital Signs BP (!) 150/67   Pulse 68   Temp 98.5 F (36.9 C)   Resp 16   SpO2 99%  Physical Exam Vitals and nursing note reviewed.  Constitutional:      General: Teresa Poole is not in acute distress.    Appearance: Teresa Poole is well-developed.  HENT:     Head: Normocephalic and atraumatic.     Nose: Congestion present.  Eyes:      Extraocular Movements: Extraocular movements intact.     Conjunctiva/sclera: Conjunctivae normal.     Pupils: Pupils are equal, round, and reactive to light.  Cardiovascular:     Rate and Rhythm: Normal rate and regular rhythm.     Pulses: Normal pulses.     Heart sounds: Normal heart sounds. No murmur heard. Pulmonary:     Effort: Pulmonary effort is normal. No respiratory distress.     Breath sounds: Normal breath sounds.  Abdominal:     Palpations: Abdomen is soft.     Tenderness: There is no abdominal tenderness.  Musculoskeletal:        General: No swelling.     Cervical back: Neck supple.  Skin:    General: Skin is warm and dry.     Capillary Refill: Capillary refill takes less than 2 seconds.  Neurological:     General: No focal deficit present.     Mental Status: Teresa Poole is alert.  Psychiatric:        Mood and Affect: Mood normal.     ED Results / Procedures / Treatments   Labs (all labs ordered are listed, but only abnormal results are displayed) Labs Reviewed  BASIC METABOLIC PANEL - Abnormal; Notable for the following components:      Result Value   CO2 20 (*)    Glucose, Bld 127 (*)    BUN 50 (*)    Creatinine, Ser 3.80 (*)    Calcium 8.7 (*)    GFR, Estimated 12 (*)    All other components within normal limits  CBC - Abnormal; Notable for the following components:   RBC 3.25 (*)    Hemoglobin 9.3 (*)    HCT 30.6 (*)    All other components within normal limits  TROPONIN I (HIGH SENSITIVITY) - Abnormal; Notable for the following components:   Troponin I (High Sensitivity) 18 (*)    All other components within normal limits  TROPONIN I (HIGH SENSITIVITY) - Abnormal; Notable for the following components:   Troponin I (High Sensitivity) 18 (*)    All other components within normal limits    EKG EKG Interpretation  Date/Time:  Friday December 04 2022 13:22:07 EDT Ventricular Rate:  70 PR Interval:  186 QRS Duration: 148 QT Interval:  432 QTC  Calculation: 466 R Axis:   -28 Text Interpretation: Normal sinus rhythm Right bundle branch block Abnormal ECG When compared with ECG of 29-Nov-2022 14:53, PREVIOUS ECG IS PRESENT Confirmed by Virgina Norfolk ([id number]) on 12/04/2022 4:52:38 PM  Radiology DG Chest 2 View  Result Date: 12/04/2022 CLINICAL DATA:  Congestion. EXAM: CHEST - 2 VIEW COMPARISON:  November 29, 2022. FINDINGS: The heart size and mediastinal  contours are within normal limits. Both lungs are clear. The visualized skeletal structures are unremarkable. IMPRESSION: No active cardiopulmonary disease. Aortic Atherosclerosis (ICD10-I70.0). Electronically Signed   By: Lupita Raider M.D.   On: 12/04/2022 13:48    Procedures Procedures    Medications Ordered in ED Medications - No data to display  ED Course/ Medical Decision Making/ A&P                             Medical Decision Making Risk OTC drugs.   Osvaldo Human P Haghighi is here with congestion in her chest and nose.  History of diabetes, hypertension, CKD.  Teresa Poole is very well-appearing.  Teresa Poole is not having any active symptoms.  Teresa Poole feels like the pollen is causing her symptoms.  Teresa Poole has had blood work done prior to my evaluation including EKG, troponin, CBC and BMP.  Per my review and interpretation troponins are at her baseline.  Teresa Poole has no significant anemia or electrolyte abnormality.  EKG shows sinus rhythm.  No ischemic changes.  Chest x-ray per my review and interpretation shows no evidence of pneumonia.  I have no concern for ACS or PE or other acute process other than may be some allergy related process.  Teresa Poole has no fever.  No white count.  I doubt infectious process.  Recommend that Teresa Poole start taking some Claritin.  Discharged in good condition.  This chart was dictated using voice recognition software.  Despite best efforts to proofread,  errors can occur which can change the documentation meaning.         Final Clinical Impression(s) / ED Diagnoses Final diagnoses:   Upper respiratory tract infection, unspecified type    Rx / DC Orders ED Discharge Orders          Ordered    loratadine (CLARITIN) 10 MG tablet  Daily        12/04/22 2156              Virgina Norfolk, DO 12/04/22 2157

## 2022-12-04 NOTE — ED Provider Triage Note (Signed)
Emergency Medicine Provider Triage Evaluation Note  Teresa Poole , a 72 y.o. female  was evaluated in triage.  Pt complains of congestion.  Reports that for the past 2 hours she has been having congestion of her face and chest.  Says that "it feels like there is a sac of potatoes on my chest and had."  No sick contacts.  Has not tried any new medications. Review of Systems  Positive:  Negative:   Physical Exam  BP (!) 189/78 (BP Location: Right Arm)   Pulse 70   Temp 99.3 F (37.4 C) (Oral)   Resp 16   SpO2 100%  Gen:   Awake, no distress   Resp:  Normal effort  MSK:   Moves extremities without difficulty  Other:  RRR  Medical Decision Making  Medically screening exam initiated at 1:13 PM.  Appropriate orders placed.  VENETTE LOGEMANN was informed that the remainder of the evaluation will be completed by another provider, this initial triage assessment does not replace that evaluation, and the importance of remaining in the ED until their evaluation is complete.    Likely allergies/congestion however patient does feel like there is severe pressure on her chest, will add ACS workup   Gorge Almanza A, PA-C 12/04/22 1313

## 2022-12-04 NOTE — ED Triage Notes (Signed)
Pt came in via POV d/t feeling congestion that started 2 hrs ago. From her sinuses down to her chest & it feels like a "sack of potatoes" on her chest. Denies pain but endorses pressure from the congestion is uncomfortable, A/Ox4.

## 2022-12-05 NOTE — Progress Notes (Signed)
Charted by mistake

## 2022-12-07 ENCOUNTER — Other Ambulatory Visit: Payer: Self-pay

## 2022-12-07 ENCOUNTER — Emergency Department (HOSPITAL_COMMUNITY): Payer: Medicare PPO

## 2022-12-07 ENCOUNTER — Emergency Department (HOSPITAL_COMMUNITY)
Admission: EM | Admit: 2022-12-07 | Discharge: 2022-12-07 | Disposition: A | Discharge status: Home / Self Care | Payer: Medicare PPO | Attending: Emergency Medicine | Admitting: Emergency Medicine

## 2022-12-07 ENCOUNTER — Encounter (HOSPITAL_COMMUNITY): Payer: Self-pay

## 2022-12-07 DIAGNOSIS — I130 Hypertensive heart and chronic kidney disease with heart failure and stage 1 through stage 4 chronic kidney disease, or unspecified chronic kidney disease: Secondary | ICD-10-CM | POA: Insufficient documentation

## 2022-12-07 DIAGNOSIS — N184 Chronic kidney disease, stage 4 (severe): Secondary | ICD-10-CM | POA: Diagnosis not present

## 2022-12-07 DIAGNOSIS — R0981 Nasal congestion: Secondary | ICD-10-CM | POA: Diagnosis present

## 2022-12-07 DIAGNOSIS — I2510 Atherosclerotic heart disease of native coronary artery without angina pectoris: Secondary | ICD-10-CM | POA: Diagnosis not present

## 2022-12-07 DIAGNOSIS — Z1152 Encounter for screening for COVID-19: Secondary | ICD-10-CM | POA: Insufficient documentation

## 2022-12-07 DIAGNOSIS — Z7982 Long term (current) use of aspirin: Secondary | ICD-10-CM | POA: Diagnosis not present

## 2022-12-07 DIAGNOSIS — I5042 Chronic combined systolic (congestive) and diastolic (congestive) heart failure: Secondary | ICD-10-CM | POA: Insufficient documentation

## 2022-12-07 DIAGNOSIS — E119 Type 2 diabetes mellitus without complications: Secondary | ICD-10-CM | POA: Insufficient documentation

## 2022-12-07 DIAGNOSIS — Z79899 Other long term (current) drug therapy: Secondary | ICD-10-CM | POA: Insufficient documentation

## 2022-12-07 DIAGNOSIS — Z8543 Personal history of malignant neoplasm of ovary: Secondary | ICD-10-CM | POA: Diagnosis not present

## 2022-12-07 DIAGNOSIS — R0989 Other specified symptoms and signs involving the circulatory and respiratory systems: Secondary | ICD-10-CM | POA: Diagnosis not present

## 2022-12-07 LAB — CBC
HCT: 29.1 % — ABNORMAL LOW (ref 36.0–46.0)
Hemoglobin: 9 g/dL — ABNORMAL LOW (ref 12.0–15.0)
MCH: 28.1 pg (ref 26.0–34.0)
MCHC: 30.9 g/dL (ref 30.0–36.0)
MCV: 90.9 fL (ref 80.0–100.0)
Platelets: 237 10*3/uL (ref 150–400)
RBC: 3.2 MIL/uL — ABNORMAL LOW (ref 3.87–5.11)
RDW: 13.4 % (ref 11.5–15.5)
WBC: 4.5 10*3/uL (ref 4.0–10.5)
nRBC: 0 % (ref 0.0–0.2)

## 2022-12-07 LAB — BASIC METABOLIC PANEL
Anion gap: 11 (ref 5–15)
BUN: 48 mg/dL — ABNORMAL HIGH (ref 8–23)
CO2: 24 mmol/L (ref 22–32)
Calcium: 8.8 mg/dL — ABNORMAL LOW (ref 8.9–10.3)
Chloride: 104 mmol/L (ref 98–111)
Creatinine, Ser: 4.13 mg/dL — ABNORMAL HIGH (ref 0.44–1.00)
GFR, Estimated: 11 mL/min — ABNORMAL LOW (ref >60)
Glucose, Bld: 114 mg/dL — ABNORMAL HIGH (ref 70–99)
Potassium: 4.3 mmol/L (ref 3.5–5.1)
Sodium: 139 mmol/L (ref 135–145)

## 2022-12-07 LAB — RESP PANEL BY RT-PCR (RSV, FLU A&B, COVID)  RVPGX2
Influenza A by PCR: NEGATIVE
Influenza B by PCR: NEGATIVE
Resp Syncytial Virus by PCR: NEGATIVE
SARS Coronavirus 2 by RT PCR: NEGATIVE

## 2022-12-07 LAB — TROPONIN I (HIGH SENSITIVITY): Troponin I (High Sensitivity): 17 ng/L (ref <18)

## 2022-12-07 NOTE — ED Provider Notes (Signed)
[facility] AT [facility] Provider Note  CSN: [id number] Arrival date & time: 12/07/22 8657  Chief Complaint(s) Chest Pain and Nasal Congestion  HPI Teresa Poole is a 72 y.o. female with history of CHF, chronic kidney disease, diabetes, hyperlipidemia, hypertension, presenting to the emergency department with congestion.  She reports that her primary complaint is congestion.  She was seen in the emergency department around 2 days ago for the same thing, thought due to be seasonal allergies, told to take Claritin however patient has not taken anything for symptoms.  She reports that she was worried because she woke up around 3 AM and thought that she should get her blood pressure checked.  She does report some chest pressure, also reports some chest congestion.  No cough, shortness of breath.  Pain is not exertional.  Pain is also not pleuritic.  No fevers or chills.  No sore throat or runny nose.  She reports overall her symptoms are very mild.  She reports she primarily came to the emergency department because she was concerned about her blood pressure.  Past Medical History Past Medical History:  Diagnosis Date   Allergy    Anemia    Carotid stenosis    Chronic combined systolic and diastolic CHF (congestive heart failure)    CKD (chronic kidney disease), stage IV 09/24/2013   Pt at [facility], Dr. Marisue Humble   COVID-19 10/24/2021   Diabetes mellitus type 2 in nonobese    Edema 09/14/2013   GERD (gastroesophageal reflux disease)    History of ovarian cancer    Hyperlipidemia    Hypertension    Mild CAD    non-obstructive by LHC (09/25/2013): Proximal and mid LAD serial 20%, proximal circumflex 30%, mid AV groove circumflex 30%, mid RCA mild plaque.   NICM (nonischemic cardiomyopathy)    Obesity (BMI 30-39.9)    RBBB    Renal artery stenosis 10/11/2018   Thyroid disease    Seen by specialist   Patient Active Problem List   Diagnosis Date  Noted   Elevated troponin I level 11/10/2022   Hypertensive emergency 11/09/2022   Demand ischemia 11/09/2022   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 11/07/2022   Acute kidney injury 11/05/2022   Dizziness 04/17/2022   (HFpEF) heart failure with preserved ejection fraction 02/28/2022   Hypomagnesemia 02/28/2022   Manic episode, unspecified    COVID-19 virus infection 10/25/2021   UTI (urinary tract infection) 10/25/2021   Anemia 10/25/2021   Suicidal ideations 10/17/2021   Acute renal failure superimposed on stage 4 chronic kidney disease 10/14/2021   ARF (acute renal failure) 10/14/2021   Small bowel obstruction    SBO (small bowel obstruction) 09/22/2021   Adjustment disorder with anxiety 06/10/2021   Atypical chest pain 11/27/2018   Renal artery stenosis 10/11/2018   Hyperlipidemia associated with type 2 diabetes mellitus 10/26/2013   Chronic systolic heart failure 09/28/2013   Chronic kidney disease (CKD), stage IV (severe) 09/24/2013   Hypertensive urgency 09/18/2013   Cardiomyopathy, hypertensive 09/18/2013   Edema 09/14/2013   Acute systolic heart failure 09/09/2013   Hypertensive heart disease with CHF (congestive heart failure) 09/09/2013   Hypertension associated with diabetes 09/07/2013   Type 2 diabetes mellitus with chronic kidney disease, without long-term current use of insulin 09/07/2013   Obesity (BMI 30-39.9)    History of ovarian cancer    Home Medication(s) Prior to Admission medications   Medication Sig Start Date End Date  Taking? Authorizing Provider  cetirizine (ZYRTEC) 10 MG tablet Take 10 mg by mouth daily as needed for allergies. 11/19/22  Yes [provider]  amLODipine (NORVASC) 10 MG tablet Take 1 tablet (10 mg total) by mouth daily. 11/14/22   Regalado, Jon Billings A, MD  aspirin EC 81 MG tablet Take 1 tablet (81 mg total) by mouth daily. Swallow whole. 11/14/22   Regalado, Belkys A, MD   azelastine (ASTELIN) 0.1 % nasal spray Place 2 sprays into both nostrils 2 (two) times daily. 12/02/22   [provider]  carvedilol (COREG) 25 MG tablet Take 1 tablet (25 mg total) by mouth 2 (two) times daily with a meal. 11/13/22   Regalado, Belkys A, MD  cyanocobalamin 1000 MCG tablet Take 1 tablet (1,000 mcg total) by mouth daily. 11/14/22   Regalado, Belkys A, MD  docusate sodium (COLACE) 100 MG capsule Take 1 capsule (100 mg total) by mouth 2 (two) times daily. 11/13/22   Regalado, Belkys A, MD  famotidine (PEPCID) 40 MG tablet Take 40 mg by mouth daily. 09/04/21   [provider]  fluticasone (FLONASE) 50 MCG/ACT nasal spray Place 2 sprays into both nostrils daily. 11/19/22   [provider]  glucose blood (ONE TOUCH ULTRA TEST) test strip Use to check blood sugar 3 times daily Dx code E11.65 Patient taking differently: 1 each by Other route See admin instructions. Use to check blood sugar 3 times daily Dx code E11.65 11/20/14   Reather Littler, MD  iron polysaccharides (NIFEREX) 150 MG capsule Take 1 capsule (150 mg total) by mouth daily. 11/14/22   Regalado, Belkys A, MD  isosorbide mononitrate (IMDUR) 30 MG 24 hr tablet Take 1 tablet (30 mg total) by mouth daily. 11/14/22   Regalado, Belkys A, MD  loratadine (CLARITIN) 10 MG tablet Take 1 tablet (10 mg total) by mouth daily. 12/04/22 01/03/23  Curatolo, Adam, DO  omeprazole (PRILOSEC) 40 MG capsule Take 40 mg by mouth daily. 03/10/22   [provider]  pantoprazole (PROTONIX) 20 MG tablet Take 1 tablet (20 mg total) by mouth daily. 11/29/22 03/29/23  Schutt, Edsel Petrin, PA-C  polyethylene glycol (MIRALAX / GLYCOLAX) 17 g packet Take 17 g by mouth daily. Patient not taking: Reported on 11/05/2022 09/28/21   Rodolph Bong, MD  QUEtiapine (SEROQUEL) 25 MG tablet Take 1 tablet (25 mg total) by mouth 2 (two) times daily as needed (Agitation). 11/13/22   Regalado, Belkys A, MD  QUEtiapine (SEROQUEL) 50 MG tablet Take 1 tablet  (50 mg total) by mouth at bedtime. 11/13/22   Regalado, Belkys A, MD  rosuvastatin (CRESTOR) 20 MG tablet Take 20 mg by mouth daily.    [provider]  sucralfate (CARAFATE) 1 g tablet Take 1 tablet (1 g total) by mouth 4 (four) times daily -  with meals and at bedtime for 5 days. 11/29/22 12/04/22  Schutt, Edsel Petrin, PA-C  thiamine (VITAMIN B-1) 100 MG tablet Take 1 tablet (100 mg total) by mouth daily. 11/14/22   Regalado, Prentiss Bells, MD  Past Surgical History Past Surgical History:  Procedure Laterality Date   LEFT HEART CATHETERIZATION WITH CORONARY ANGIOGRAM N/A 09/25/2013   Procedure: LEFT HEART CATHETERIZATION WITH CORONARY ANGIOGRAM;  Surgeon: Kathleene Hazel, MD;  Location: [facility] CATH LAB;  Service: Cardiovascular;  Laterality: N/A;   RENAL ANGIOGRAPHY N/A 10/06/2018   Procedure: RENAL ANGIOGRAPHY;  Surgeon: Cephus Shelling, MD;  Location: [facility] INVASIVE CV LAB;  Service: Cardiovascular;  Laterality: N/A;   Family History Family History  Problem Relation Age of Onset   Diabetes Father    Hypertension Father    Hypertension Maternal Grandfather     Social History Social History   Tobacco Use   Smoking status: Never   Smokeless tobacco: Never  Vaping Use   Vaping Use: Never used  Substance Use Topics   Alcohol use: No   Drug use: No   Allergies Hydralazine hcl and Atorvastatin  Review of Systems Review of Systems  All other systems reviewed and are negative.   Physical Exam Vital Signs  I have reviewed the triage vital signs BP (!) 187/88   Pulse 75   Temp 97.7 F (36.5 C)   Resp 16   SpO2 100%  Physical Exam Vitals and nursing note reviewed.  Constitutional:      General: She is not in acute distress.    Appearance: She is well-developed.  HENT:     Head: Normocephalic and atraumatic.     Mouth/Throat:     Mouth:  Mucous membranes are moist.  Eyes:     Pupils: Pupils are equal, round, and reactive to light.  Cardiovascular:     Rate and Rhythm: Normal rate and regular rhythm.     Heart sounds: No murmur heard. Pulmonary:     Effort: Pulmonary effort is normal. No respiratory distress.     Breath sounds: Normal breath sounds.  Abdominal:     General: Abdomen is flat.     Palpations: Abdomen is soft.     Tenderness: There is no abdominal tenderness.  Musculoskeletal:        General: No tenderness.     Right lower leg: No edema.     Left lower leg: No edema.  Skin:    General: Skin is warm and dry.  Neurological:     General: No focal deficit present.     Mental Status: She is alert. Mental status is at baseline.  Psychiatric:        Mood and Affect: Mood normal.        Behavior: Behavior normal.     ED Results and Treatments Labs (all labs ordered are listed, but only abnormal results are displayed) Labs Reviewed  BASIC METABOLIC PANEL - Abnormal; Notable for the following components:      Result Value   Glucose, Bld 114 (*)    BUN 48 (*)    Creatinine, Ser 4.13 (*)    Calcium 8.8 (*)    GFR, Estimated 11 (*)    All other components within normal limits  CBC - Abnormal; Notable for the following components:   RBC 3.20 (*)    Hemoglobin 9.0 (*)    HCT 29.1 (*)    All other components within normal limits  RESP PANEL BY RT-PCR (RSV, FLU A&B, COVID)  RVPGX2  TROPONIN I (HIGH SENSITIVITY)  Radiology DG Chest 2 View  Result Date: 12/07/2022 CLINICAL DATA:  chest pressure EXAM: CHEST - 2 VIEW COMPARISON:  12/04/2022 FINDINGS: Cardiac silhouette is unremarkable. No pneumothorax or pleural effusion. The lungs are clear. Aorta is calcified. There are thoracic degenerative changes. IMPRESSION: No acute cardiopulmonary process. Electronically Signed   By: Layla Maw M.D.   On: 12/07/2022 07:41    Pertinent labs & imaging results that were available during my care of the patient were reviewed by me and considered in my medical decision making (see MDM for details).  Medications Ordered in ED Medications - No data to display                                                                                                                                   Procedures Procedures  (including critical care time)  Medical Decision Making / ED Course   MDM:  72 year old female presenting to the emergency department for congestion.  Patient overall very well-appearing,.   physical exam reassuring  Patient was here a few days ago for congestion, recommended take Claritin, she reports she did not take anything.  She did report some chest pressure but reports that she thinks this is due to allergies, overall very low concern for ACS, symptoms atypical, initial troponin normal.  Very low concern for PE, no pleuritic pain, tachycardia, hypoxia.  Chest x-ray without sign of pneumonia, pneumothorax.  Doubt dissection, symptoms mild.  Patient reports she primarily came in because she wanted her blood pressure checked.  Blood pressure is mildly elevated but she has not taken any of her home medications.  Anticipate likely discharge after second troponin.  Clinical Course as of 12/07/22 0932  Mon Dec 07, 2022  9798 Patient request discharge.  She reports that her symptoms began at 3 AM, and her troponin drawn at 7:11 AM is normal.  Her symptoms are also very atypical for ACS.  She denies any ongoing chest pressure.  Suspect ongoing congestion. she reports that she has experienced this before with allergies. Will discharge patient to home. All questions answered. Patient comfortable with plan of discharge. Return precautions discussed with patient and specified on the after visit summary.  [WS]    Clinical Course User Index [WS] Lonell Grandchild, MD      Additional history obtained: -External records from outside source obtained and reviewed including: Chart review including previous notes, labs, imaging, consultation notes including multiple previous ER visits   Lab Tests: -I ordered, reviewed, and interpreted labs.   The pertinent results include:   Labs Reviewed  BASIC METABOLIC PANEL - Abnormal; Notable for the following components:      Result Value   Glucose, Bld 114 (*)    BUN 48 (*)    Creatinine, Ser 4.13 (*)    Calcium 8.8 (*)    GFR, Estimated 11 (*)    All other components within normal limits  CBC - Abnormal; Notable for the following components:   RBC 3.20 (*)    Hemoglobin 9.0 (*)    HCT 29.1 (*)    All other components within normal limits  RESP PANEL BY RT-PCR (RSV, FLU A&B, COVID)  RVPGX2  TROPONIN I (HIGH SENSITIVITY)    Notable for normal troponin. CKD.   EKG   EKG Interpretation  Date/Time:  Monday December 07 2022 06:59:28 EDT Ventricular Rate:  73 PR Interval:  194 QRS Duration: 150 QT Interval:  464 QTC Calculation: 511 R Axis:   -31 Text Interpretation: Normal sinus rhythm Left axis deviation Right bundle branch block Abnormal ECG When compared with ECG of 04-Dec-2022 13:22, No significant change since last tracing Confirmed by Alvino Blood ([id number]) on 12/07/2022 8:04:15 AM         Imaging Studies ordered: I ordered imaging studies including CXR On my interpretation imaging demonstrates no acute process I independently visualized and interpreted imaging. I agree with the radiologist interpretation   Medicines ordered and prescription drug management: No orders of the defined types were placed in this encounter.   -I have reviewed the patients home medicines and have made adjustments as needed  Cardiac Monitoring: The patient was maintained on a cardiac monitor.  I personally viewed and interpreted the cardiac monitored which showed an underlying rhythm of: NSR   Co  morbidities that complicate the patient evaluation  Past Medical History:  Diagnosis Date   Allergy    Anemia    Carotid stenosis    Chronic combined systolic and diastolic CHF (congestive heart failure)    CKD (chronic kidney disease), stage IV 09/24/2013   Pt at [facility], Dr. Marisue Humble   COVID-19 10/24/2021   Diabetes mellitus type 2 in nonobese    Edema 09/14/2013   GERD (gastroesophageal reflux disease)    History of ovarian cancer    Hyperlipidemia    Hypertension    Mild CAD    non-obstructive by LHC (09/25/2013): Proximal and mid LAD serial 20%, proximal circumflex 30%, mid AV groove circumflex 30%, mid RCA mild plaque.   NICM (nonischemic cardiomyopathy)    Obesity (BMI 30-39.9)    RBBB    Renal artery stenosis 10/11/2018   Thyroid disease    Seen by specialist      Dispostion: Disposition decision including need for hospitalization was considered, and patient discharged from emergency department.    Final Clinical Impression(s) / ED Diagnoses Final diagnoses:  Chest congestion  Nasal congestion     This chart was dictated using voice recognition software.  Despite best efforts to proofread,  errors can occur which can change the documentation meaning.    Lonell Grandchild, MD 12/07/22 [phone]

## 2022-12-07 NOTE — ED Notes (Signed)
BP 180/90 HR68

## 2022-12-07 NOTE — ED Triage Notes (Signed)
Pt arrived from home via POV c/o chest pressure. Pt states feels like 10 pounds sitting on her chest. Pt also c/o chest congestion and a stuffy nose. Pt is afebrile

## 2022-12-13 NOTE — Progress Notes (Signed)
Office Visit    Patient Name: Teresa Poole Date of Encounter: 12/14/2022  PCP:  Malka So., MD   [facility]  Cardiologist:  Lance Muss, MD  Advanced Practice Provider:  No care team member to display Electrophysiologist:  None   Chief Complaint    Teresa Poole is a 72 y.o. female with past medical history of carotid stenosis, chronic combined systolic and diastolic CHF, CKD stage IV (patient of Dr. Verna Czech), diabetes mellitus type 2 presents today for overdue follow-up visit.  She was last seen via telemedicine in 2020.  At that time it was mentioned she was diagnosed in 2015 with nonischemic cardiomyopathy.  EF was 30 to 35%.  Since 2015 she lost 50 pounds through diet control.  Diabetes mellitus improved.  Renal stent was placed 09/2018.  Initially BP came down, but then went up more recently.  Hospitalized 11/2018 with diffuse pain and increased blood pressure.  She was on steroids at that time which were thought to increase the BP.  She has been eating more salt as of late and blood pressures were 140s to 160s systolic.  Found to be allergic to hydralazine.  When she cut down on salt blood pressure was much better running in the 1 20-1 30 systolic range.  Eating more fresh vegetables.  Was walking around more as well.  She was diagnosed with OSA but did not get CPAP.  It looks like she may have seen [facility] cardiology 08/2021 and a phone call from our practice a few weeks after asking if she wanted to reestablish care at [facility] was never answered.  Today, she tells me that she is not sure about the appointment with [facility] last year but plans to continue seeing our practice.  She states that today she has had head cold and has been experiencing some pressure in her chest and shortness of breath.  She states that her nose is moderately stuffy and she suffers from seasonal allergies that stay with her consistently.  She has not had any exertional  chest pain or shortness of breath.  We discussed taking Mucinex which is safe to do with her other medications and also taking antihistamines for her symptoms.  If her symptoms do not resolve with conservative therapy in a week or 2 she is to go see her primary care and get a chest x-ray.  Lungs are clear on exam today.  Blood pressure is extremely high 200s systolic today and she had not taken her medication.  She had brought her pills to the office and took what she thought was her amlodipine and carvedilol.  By the end of the appointment it had decreased to 185/80.  We reviewed her most recent echocardiogram and carotid ultrasound.  We have asked her to buy a new blood pressure cuff and monitor her blood pressure at home.  We have increased her Imdur which she cannot remember who started that medication but it was started about a month ago.  We also discussed her kidneys and she does see a kidney doctor.  She states she experiences loud swishing in her ear on her left side and also has some tinnitus.  No edema, orthopnea, PND. Reports no palpitations.    Past Medical History    Past Medical History:  Diagnosis Date   Allergy    Anemia    Carotid stenosis    Chronic combined systolic and diastolic CHF (congestive heart failure)    CKD (  chronic kidney disease), stage IV 09/24/2013   Pt at [facility], Dr. Marisue Humble   COVID-19 10/24/2021   Diabetes mellitus type 2 in nonobese    Edema 09/14/2013   GERD (gastroesophageal reflux disease)    History of ovarian cancer    Hyperlipidemia    Hypertension    Mild CAD    non-obstructive by LHC (09/25/2013): Proximal and mid LAD serial 20%, proximal circumflex 30%, mid AV groove circumflex 30%, mid RCA mild plaque.   NICM (nonischemic cardiomyopathy)    Obesity (BMI 30-39.9)    RBBB    Renal artery stenosis 10/11/2018   Thyroid disease    Seen by specialist   Past Surgical History:  Procedure Laterality Date   LEFT HEART CATHETERIZATION WITH  CORONARY ANGIOGRAM N/A 09/25/2013   Procedure: LEFT HEART CATHETERIZATION WITH CORONARY ANGIOGRAM;  Surgeon: Kathleene Hazel, MD;  Location: [facility] CATH LAB;  Service: Cardiovascular;  Laterality: N/A;   RENAL ANGIOGRAPHY N/A 10/06/2018   Procedure: RENAL ANGIOGRAPHY;  Surgeon: Cephus Shelling, MD;  Location: [facility] INVASIVE CV LAB;  Service: Cardiovascular;  Laterality: N/A;    Allergies  Allergies  Allergen Reactions   Hydralazine Hcl Itching    ENTIRE BODY = burning sensation, also in the eyes (they have become very sensitive to light)   Atorvastatin Other (See Comments)    Per MD - pt not sure of reaction     EKGs/Labs/Other Studies Reviewed:   The following studies were reviewed today:  Echo 11/06/22  IMPRESSIONS     1. Left ventricular ejection fraction, by estimation, is 45 to 50%. The  left ventricle has mildly decreased function. The left ventricle  demonstrates global hypokinesis. There is mild concentric left ventricular  hypertrophy. Left ventricular diastolic  parameters are consistent with Grade II diastolic dysfunction  (pseudonormalization).   2. Right ventricular systolic function is normal. The right ventricular  size is normal.   3. Left atrial size was moderately dilated.   4. The mitral valve is grossly normal. Trivial mitral valve  regurgitation. No evidence of mitral stenosis.   5. The aortic valve is tricuspid. There is mild calcification of the  aortic valve. There is mild thickening of the aortic valve. Aortic valve  regurgitation is trivial. Aortic valve sclerosis/calcification is present,  without any evidence of aortic  stenosis.   6. The inferior vena cava is normal in size with greater than 50%  respiratory variability, suggesting right atrial pressure of 3 mmHg.   Comparison(s): Prior images reviewed side by side. Changes from prior  study are noted.   Conclusion(s)/Recommendation(s): Mildly reduced LVEF compared to prior, in  a pattern  of global hypokinesis. No focal wall motion abnormalities noted.   EKG:  EKG is not ordered today.    Recent Labs: 08/30/2022: B Natriuretic Peptide 58.5 10/18/2022: TSH 1.017 11/02/2022: Magnesium 1.9 11/29/2022: ALT 11 12/07/2022: BUN 48; Creatinine, Ser 4.13; Hemoglobin 9.0; Platelets 237; Potassium 4.3; Sodium 139  Recent Lipid Panel    Component Value Date/Time   CHOL 137 04/18/2022 0154   TRIG 103 04/18/2022 0154   HDL 46 04/18/2022 0154   CHOLHDL 3.0 04/18/2022 0154   VLDL 21 04/18/2022 0154   LDLCALC 70 04/18/2022 0154   LDLDIRECT 111.0 09/20/2019 1337    Home Medications   Current Meds  Medication Sig   amLODipine (NORVASC) 10 MG tablet Take 1 tablet (10 mg total) by mouth daily.   aspirin EC 81 MG tablet Take 1 tablet (81 mg total) by mouth  daily. Swallow whole.   azelastine (ASTELIN) 0.1 % nasal spray Place 2 sprays into both nostrils 2 (two) times daily.   carvedilol (COREG) 25 MG tablet Take 1 tablet (25 mg total) by mouth 2 (two) times daily with a meal.   cetirizine (ZYRTEC) 10 MG tablet Take 10 mg by mouth daily as needed for allergies.   cyanocobalamin 1000 MCG tablet Take 1 tablet (1,000 mcg total) by mouth daily.   docusate sodium (COLACE) 100 MG capsule Take 1 capsule (100 mg total) by mouth 2 (two) times daily.   famotidine (PEPCID) 40 MG tablet Take 40 mg by mouth daily.   fluticasone (FLONASE) 50 MCG/ACT nasal spray Place 2 sprays into both nostrils daily.   glucose blood (ONE TOUCH ULTRA TEST) test strip Use to check blood sugar 3 times daily Dx code E11.65 (Patient taking differently: 1 each by Other route See admin instructions. Use to check blood sugar 3 times daily Dx code E11.65)   iron polysaccharides (NIFEREX) 150 MG capsule Take 1 capsule (150 mg total) by mouth daily.   isosorbide mononitrate (IMDUR) 60 MG 24 hr tablet Take 1 tablet (60 mg total) by mouth daily.   loratadine (CLARITIN) 10 MG tablet Take 1 tablet (10 mg total) by mouth daily.    omeprazole (PRILOSEC) 40 MG capsule Take 40 mg by mouth daily.   pantoprazole (PROTONIX) 20 MG tablet Take 1 tablet (20 mg total) by mouth daily.   polyethylene glycol (MIRALAX / GLYCOLAX) 17 g packet Take 17 g by mouth daily.   QUEtiapine (SEROQUEL) 25 MG tablet Take 1 tablet (25 mg total) by mouth 2 (two) times daily as needed (Agitation).   QUEtiapine (SEROQUEL) 50 MG tablet Take 1 tablet (50 mg total) by mouth at bedtime.   rosuvastatin (CRESTOR) 20 MG tablet Take 20 mg by mouth daily.   thiamine (VITAMIN B-1) 100 MG tablet Take 1 tablet (100 mg total) by mouth daily.   [DISCONTINUED] isosorbide mononitrate (IMDUR) 30 MG 24 hr tablet Take 1 tablet (30 mg total) by mouth daily.     Review of Systems      All other systems reviewed and are otherwise negative except as noted above.  Physical Exam    VS:  Pulse 77   Ht 5\' 7"  (1.702 m)   Wt 158 lb (71.7 kg)   SpO2 99%   BMI 24.75 kg/m  , BMI Body mass index is 24.75 kg/m.  BP 185/80  Wt Readings from Last 3 Encounters:  12/14/22 158 lb (71.7 kg)  11/29/22 155 lb (70.3 kg)  11/15/22 154 lb 5.2 oz (70 kg)     GEN: Well nourished, well developed, in no acute distress. HEENT: normal. Neck: Supple, no JVD, carotid bruits, or masses. Cardiac: RRR, no murmurs, rubs, or gallops. No clubbing, cyanosis, 1+ pitting edema bilaterally.  Radials/PT 2+ and equal bilaterally.  Respiratory:  Respirations regular and unlabored, clear to auscultation bilaterally. GI: Soft, nontender, nondistended. MS: No deformity or atrophy. Skin: Warm and dry, no rash. Neuro:  Strength and sensation are intact. Psych: Normal affect.  Assessment & Plan    CKD, stage 4 -Dr. Verna Czech creatinine 4, not on dialysis -We will hold off on starting a diuretics due to her creatinine.  Hypertension -Imdur was started a month  ago -she does not want any additional medications since she already in on too many (per patient) -will increase Imdur dose for better BP  control -discussed getting a new BP cuff and monitor at  home -patient tells me its not abnormal for her to be in the 180 systolic at home and she does not know why its so high since she takes her medications -suggested she come back to see Pharm D for BP check  -left renal artery stenosis 1-59%-would check annually  NICM -1+ pitting edema in LE. -continue current medications -reviewed most recent echo and [place] with her  Fatigue -she still stays pretty tired  -would continue current medications for now and getting some updated labs through PCP    Disposition: Follow up 2-3 months with Lance Muss, MD or APP.  Signed, Sharlene Dory, PA-C 12/14/2022, 12:10 PM [facility]

## 2022-12-14 ENCOUNTER — Ambulatory Visit: Payer: Medicare PPO | Attending: Physician Assistant | Admitting: Physician Assistant

## 2022-12-14 ENCOUNTER — Encounter: Payer: Self-pay | Admitting: Physician Assistant

## 2022-12-14 VITALS — HR 77 | Ht 67.0 in | Wt 158.0 lb

## 2022-12-14 DIAGNOSIS — R5383 Other fatigue: Secondary | ICD-10-CM | POA: Diagnosis not present

## 2022-12-14 DIAGNOSIS — I10 Essential (primary) hypertension: Secondary | ICD-10-CM

## 2022-12-14 DIAGNOSIS — I428 Other cardiomyopathies: Secondary | ICD-10-CM

## 2022-12-14 DIAGNOSIS — N184 Chronic kidney disease, stage 4 (severe): Secondary | ICD-10-CM | POA: Diagnosis not present

## 2022-12-14 MED ORDER — ISOSORBIDE MONONITRATE ER 60 MG PO TB24: 60.0000 mg | ORAL_TABLET | Freq: Every day | ORAL | 3 refills | Status: DC

## 2022-12-14 NOTE — Patient Instructions (Addendum)
Medication Instructions:  1.INCREASE IMDUR TO 60 MG DAILY *If you need a refill on your cardiac medications before your next appointment, please call your pharmacy*  Lab Work: LIPIDS AND LFT'S--TODAY If you have labs (blood work) drawn today and your tests are completely normal, you will receive your results only by: MyChart Message (if you have MyChart) OR A paper copy in the mail If you have any lab test that is abnormal or we need to change your treatment, we will call you to review the results.  Follow-Up: At [facility], you and your health needs are our priority.  As part of our continuing mission to provide you with exceptional heart care, we have created designated Provider Care Teams.  These Care Teams include your primary Cardiologist (physician) and Advanced Practice Providers (APPs -  Physician Assistants and Nurse Practitioners) who all work together to provide you with the care you need, when you need it.  Your next appointment:   2 week(s) with the pharmacist here in our office 3-4 months with Dr Eldridge Dace  Other Instructions 1.OKAY TO TAKE PLAIN MUCINEX--NOT D 2.OKAY TO TAKE PLAIN CLARITIN OR ZYRTEC--NOT D 3.OMRON IS THE BRAND OF HOME BLOOD PRESSURE CUFF THAT WE DISCUSSED 4.Check your blood pressure daily, 1-2 hours after taking your morning medication for 2 weeks, keep a log and send [place] the readings through mychart at the end of the 2 weeks  Heart-Healthy Eating Plan Many factors influence your heart health, including eating and exercise habits. Heart health is also called coronary health. Coronary risk increases with abnormal blood fat (lipid) levels. A heart-healthy eating plan includes limiting unhealthy fats, increasing healthy fats, limiting salt (sodium) intake, and making other diet and lifestyle changes. What is my plan? Your health care provider may recommend that: You limit your fat intake to _________% or less of your total calories each day. You  limit your saturated fat intake to _________% or less of your total calories each day. You limit the amount of cholesterol in your diet to less than _________ mg per day. You limit the amount of sodium in your diet to less than _________ mg per day. What are tips for following this plan? Cooking Cook foods using methods other than frying. Baking, boiling, grilling, and broiling are all good options. Other ways to reduce fat include: Removing the skin from poultry. Removing all visible fats from meats. Steaming vegetables in water or broth. Meal planning  At meals, imagine dividing your plate into fourths: Fill one-half of your plate with vegetables and green salads. Fill one-fourth of your plate with whole grains. Fill one-fourth of your plate with lean protein foods. Eat 2-4 cups of vegetables per day. One cup of vegetables equals 1 cup (91 g) broccoli or cauliflower florets, 2 medium carrots, 1 large bell pepper, 1 large sweet potato, 1 large tomato, 1 medium white potato, 2 cups (150 g) raw leafy greens. Eat 1-2 cups of fruit per day. One cup of fruit equals 1 small apple, 1 large banana, 1 cup (237 g) mixed fruit, 1 large orange,  cup (82 g) dried fruit, 1 cup (240 mL) 100% fruit juice. Eat more foods that contain soluble fiber. Examples include apples, broccoli, carrots, beans, peas, and barley. Aim to get 25-30 g of fiber per day. Increase your consumption of legumes, nuts, and seeds to 4-5 servings per week. One serving of dried beans or legumes equals  cup (90 g) cooked, 1 serving of nuts is  oz (12  almonds, 24 pistachios, or 7 walnut halves), and 1 serving of seeds equals  oz (8 g). Fats Choose healthy fats more often. Choose monounsaturated and polyunsaturated fats, such as olive and canola oils, avocado oil, flaxseeds, walnuts, almonds, and seeds. Eat more omega-3 fats. Choose salmon, mackerel, sardines, tuna, flaxseed oil, and ground flaxseeds. Aim to eat fish at least 2 times  each week. Check food labels carefully to identify foods with trans fats or high amounts of saturated fat. Limit saturated fats. These are found in animal products, such as meats, butter, and cream. Plant sources of saturated fats include palm oil, palm kernel oil, and coconut oil. Avoid foods with partially hydrogenated oils in them. These contain trans fats. Examples are stick margarine, some tub margarines, cookies, crackers, and other baked goods. Avoid fried foods. General information Eat more home-cooked food and less restaurant, buffet, and fast food. Limit or avoid alcohol. Limit foods that are high in added sugar and simple starches such as foods made using white refined flour (white breads, pastries, sweets). Lose weight if you are overweight. Losing just 5-10% of your body weight can help your overall health and prevent diseases such as diabetes and heart disease. Monitor your sodium intake, especially if you have high blood pressure. Talk with your health care provider about your sodium intake. Try to incorporate more vegetarian meals weekly. What foods should I eat? Fruits All fresh, canned (in natural juice), or frozen fruits. Vegetables Fresh or frozen vegetables (raw, steamed, roasted, or grilled). Green salads. Grains Most grains. Choose whole wheat and whole grains most of the time. Rice and pasta, including brown rice and pastas made with whole wheat. Meats and other proteins Lean, well-trimmed beef, veal, pork, and lamb. Chicken and [place] without skin. All fish and shellfish. Wild duck, rabbit, pheasant, and venison. Egg whites or low-cholesterol egg substitutes. Dried beans, peas, lentils, and tofu. Seeds and most nuts. Dairy Low-fat or nonfat cheeses, including ricotta and mozzarella. Skim or 1% milk (liquid, powdered, or evaporated). Buttermilk made with low-fat milk. Nonfat or low-fat yogurt. Fats and oils Non-hydrogenated (trans-free) margarines. Vegetable oils,  including soybean, sesame, sunflower, olive, avocado, peanut, safflower, corn, canola, and cottonseed. Salad dressings or mayonnaise made with a vegetable oil. Beverages Water (mineral or sparkling). Coffee and tea. Unsweetened ice tea. Diet beverages. Sweets and desserts Sherbet, gelatin, and fruit ice. Small amounts of dark chocolate. Limit all sweets and desserts. Seasonings and condiments All seasonings and condiments. The items listed above may not be a complete list of foods and beverages you can eat. Contact a dietitian for more options. What foods should I avoid? Fruits Canned fruit in heavy syrup. Fruit in cream or butter sauce. Fried fruit. Limit coconut. Vegetables Vegetables cooked in cheese, cream, or butter sauce. Fried vegetables. Grains Breads made with saturated or trans fats, oils, or whole milk. Croissants. Sweet rolls. Donuts. High-fat crackers, such as cheese crackers and chips. Meats and other proteins Fatty meats, such as hot dogs, ribs, sausage, bacon, rib-eye roast or steak. High-fat deli meats, such as salami and bologna. Caviar. Domestic duck and goose. Organ meats, such as liver. Dairy Cream, sour cream, cream cheese, and creamed cottage cheese. Whole-milk cheeses. Whole or 2% milk (liquid, evaporated, or condensed). Whole buttermilk. Cream sauce or high-fat cheese sauce. Whole-milk yogurt. Fats and oils Meat fat, or shortening. Cocoa butter, hydrogenated oils, palm oil, coconut oil, palm kernel oil. Solid fats and shortenings, including bacon fat, salt pork, lard, and butter. Nondairy cream substitutes. Salad  dressings with cheese or sour cream. Beverages Regular sodas and any drinks with added sugar. Sweets and desserts Frosting. Pudding. Cookies. Cakes. Pies. Milk chocolate or white chocolate. Buttered syrups. Full-fat ice cream or ice cream drinks. The items listed above may not be a complete list of foods and beverages to avoid. Contact a dietitian for more  information. Summary Heart-healthy meal planning includes limiting unhealthy fats, increasing healthy fats, limiting salt (sodium) intake and making other diet and lifestyle changes. Lose weight if you are overweight. Losing just 5-10% of your body weight can help your overall health and prevent diseases such as diabetes and heart disease. Focus on eating a balance of foods, including fruits and vegetables, low-fat or nonfat dairy, lean protein, nuts and legumes, whole grains, and heart-healthy oils and fats. This information is not intended to replace advice given to you by your health care provider. Make sure you discuss any questions you have with your health care provider. Document Revised: 09/15/2021 Document Reviewed: 09/15/2021 Elsevier Patient Education  2023 Elsevier Inc.  Low-Sodium Eating Plan Sodium, which is an element that makes up salt, helps you maintain a healthy balance of fluids in your body. Too much sodium can increase your blood pressure and cause fluid and waste to be held in your body. Your health care provider or dietitian may recommend following this plan if you have high blood pressure (hypertension), kidney disease, liver disease, or heart failure. Eating less sodium can help lower your blood pressure, reduce swelling, and protect your heart, liver, and kidneys. What are tips for following this plan? Reading food labels The Nutrition Facts label lists the amount of sodium in one serving of the food. If you eat more than one serving, you must multiply the listed amount of sodium by the number of servings. Choose foods with less than 140 mg of sodium per serving. Avoid foods with 300 mg of sodium or more per serving. Shopping  Look for lower-sodium products, often labeled as "low-sodium" or "no salt added." Always check the sodium content, even if foods are labeled as "unsalted" or "no salt added." Buy fresh foods. Avoid canned foods and pre-made or frozen  meals. Avoid canned, cured, or processed meats. Buy breads that have less than 80 mg of sodium per slice. Cooking  Eat more home-cooked food and less restaurant, buffet, and fast food. Avoid adding salt when cooking. Use salt-free seasonings or herbs instead of table salt or sea salt. Check with your health care provider or pharmacist before using salt substitutes. Cook with plant-based oils, such as canola, sunflower, or olive oil. Meal planning When eating at a restaurant, ask that your food be prepared with less salt or no salt, if possible. Avoid dishes labeled as brined, pickled, cured, smoked, or made with soy sauce, miso, or teriyaki sauce. Avoid foods that contain MSG (monosodium glutamate). MSG is sometimes added to [place] food, bouillon, and some canned foods. Make meals that can be grilled, baked, poached, roasted, or steamed. These are generally made with less sodium. General information Most people on this plan should limit their sodium intake to 1,500-2,000 mg (milligrams) of sodium each day. What foods should I eat? Fruits Fresh, frozen, or canned fruit. Fruit juice. Vegetables Fresh or frozen vegetables. "No salt added" canned vegetables. "No salt added" tomato sauce and paste. Low-sodium or reduced-sodium tomato and vegetable juice. Grains Low-sodium cereals, including oats, puffed wheat and rice, and shredded wheat. Low-sodium crackers. Unsalted rice. Unsalted pasta. Low-sodium bread. Whole-grain breads and whole-grain  pasta. Meats and other proteins Fresh or frozen (no salt added) meat, poultry, seafood, and fish. Low-sodium canned tuna and salmon. Unsalted nuts. Dried peas, beans, and lentils without added salt. Unsalted canned beans. Eggs. Unsalted nut butters. Dairy Milk. Soy milk. Cheese that is naturally low in sodium, such as ricotta cheese, fresh mozzarella, or Swiss cheese. Low-sodium or reduced-sodium cheese. Cream cheese. Yogurt. Seasonings and condiments Fresh  and dried herbs and spices. Salt-free seasonings. Low-sodium mustard and ketchup. Sodium-free salad dressing. Sodium-free light mayonnaise. Fresh or refrigerated horseradish. Lemon juice. Vinegar. Other foods Homemade, reduced-sodium, or low-sodium soups. Unsalted popcorn and pretzels. Low-salt or salt-free chips. The items listed above may not be a complete list of foods and beverages you can eat. Contact a dietitian for more information. What foods should I avoid? Vegetables Sauerkraut, pickled vegetables, and relishes. Olives. [place] fries. Onion rings. Regular canned vegetables (not low-sodium or reduced-sodium). Regular canned tomato sauce and paste (not low-sodium or reduced-sodium). Regular tomato and vegetable juice (not low-sodium or reduced-sodium). Frozen vegetables in sauces. Grains Instant hot cereals. Bread stuffing, pancake, and biscuit mixes. Croutons. Seasoned rice or pasta mixes. Noodle soup cups. Boxed or frozen macaroni and cheese. Regular salted crackers. Self-rising flour. Meats and other proteins Meat or fish that is salted, canned, smoked, spiced, or pickled. Precooked or cured meat, such as sausages or meat loaves. Tomasa Blase. Ham. Pepperoni. Hot dogs. Corned beef. Chipped beef. Salt pork. Jerky. Pickled herring. Anchovies and sardines. Regular canned tuna. Salted nuts. Dairy Processed cheese and cheese spreads. Hard cheeses. Cheese curds. Blue cheese. Feta cheese. String cheese. Regular cottage cheese. Buttermilk. Canned milk. Fats and oils Salted butter. Regular margarine. Ghee. Bacon fat. Seasonings and condiments Onion salt, garlic salt, seasoned salt, table salt, and sea salt. Canned and packaged gravies. Worcestershire sauce. Tartar sauce. Barbecue sauce. Teriyaki sauce. Soy sauce, including reduced-sodium. Steak sauce. Fish sauce. Oyster sauce. Cocktail sauce. Horseradish that you find on the shelf. Regular ketchup and mustard. Meat flavorings and tenderizers. Bouillon  cubes. Hot sauce. Pre-made or packaged marinades. Pre-made or packaged taco seasonings. Relishes. Regular salad dressings. Salsa. Other foods Salted popcorn and pretzels. Corn chips and puffs. Potato and tortilla chips. Canned or dried soups. Pizza. Frozen entrees and pot pies. The items listed above may not be a complete list of foods and beverages you should avoid. Contact a dietitian for more information. Summary Eating less sodium can help lower your blood pressure, reduce swelling, and protect your heart, liver, and kidneys. Most people on this plan should limit their sodium intake to 1,500-2,000 mg (milligrams) of sodium each day. Canned, boxed, and frozen foods are high in sodium. Restaurant foods, fast foods, and pizza are also very high in sodium. You also get sodium by adding salt to food. Try to cook at home, eat more fresh fruits and vegetables, and eat less fast food and canned, processed, or prepared foods. This information is not intended to replace advice given to you by your health care provider. Make sure you discuss any questions you have with your health care provider. Document Revised: 07/17/2019 Document Reviewed: 07/12/2019 Elsevier Patient Education  2023 [employer].

## 2022-12-15 LAB — HEPATIC FUNCTION PANEL
ALT: 8 IU/L (ref 0–32)
AST: 18 IU/L (ref 0–40)
Albumin: 3.7 g/dL — ABNORMAL LOW (ref 3.9–4.9)
Alkaline Phosphatase: 69 IU/L (ref 44–121)
Bilirubin Total: 0.2 mg/dL (ref 0.0–1.2)
Bilirubin, Direct: <0.1 mg/dL (ref 0.00–0.40)
Total Protein: 6.2 g/dL (ref 6.0–8.5)

## 2022-12-15 LAB — LIPID PANEL
Chol/HDL Ratio: 3.2 ratio (ref 0.0–4.4)
Cholesterol, Total: 195 mg/dL (ref 100–199)
HDL: 61 mg/dL (ref >39)
LDL Chol Calc (NIH): 112 mg/dL — ABNORMAL HIGH (ref 0–99)
Triglycerides: 124 mg/dL (ref 0–149)
VLDL Cholesterol Cal: 22 mg/dL (ref 5–40)

## 2022-12-17 ENCOUNTER — Other Ambulatory Visit: Payer: Self-pay | Admitting: *Deleted

## 2022-12-17 DIAGNOSIS — Z79899 Other long term (current) drug therapy: Secondary | ICD-10-CM

## 2022-12-17 MED ORDER — ROSUVASTATIN CALCIUM 40 MG PO TABS: 40.0000 mg | ORAL_TABLET | Freq: Every day | ORAL | 3 refills | Status: DC

## 2022-12-21 ENCOUNTER — Other Ambulatory Visit: Payer: Self-pay

## 2022-12-21 ENCOUNTER — Observation Stay (HOSPITAL_COMMUNITY)
Admission: EM | Admit: 2022-12-21 | Discharge: 2022-12-22 | Disposition: A | Discharge status: Home / Self Care | Payer: Medicare PPO | Attending: Internal Medicine | Admitting: Internal Medicine

## 2022-12-21 ENCOUNTER — Encounter (HOSPITAL_COMMUNITY): Payer: Self-pay

## 2022-12-21 DIAGNOSIS — K922 Gastrointestinal hemorrhage, unspecified: Secondary | ICD-10-CM | POA: Insufficient documentation

## 2022-12-21 DIAGNOSIS — Z8543 Personal history of malignant neoplasm of ovary: Secondary | ICD-10-CM | POA: Insufficient documentation

## 2022-12-21 DIAGNOSIS — D62 Acute posthemorrhagic anemia: Secondary | ICD-10-CM | POA: Diagnosis not present

## 2022-12-21 DIAGNOSIS — R1013 Epigastric pain: Secondary | ICD-10-CM | POA: Insufficient documentation

## 2022-12-21 DIAGNOSIS — R262 Difficulty in walking, not elsewhere classified: Secondary | ICD-10-CM | POA: Insufficient documentation

## 2022-12-21 DIAGNOSIS — K2289 Other specified disease of esophagus: Secondary | ICD-10-CM | POA: Diagnosis not present

## 2022-12-21 DIAGNOSIS — E1159 Type 2 diabetes mellitus with other circulatory complications: Secondary | ICD-10-CM | POA: Diagnosis not present

## 2022-12-21 DIAGNOSIS — I4581 Long QT syndrome: Secondary | ICD-10-CM | POA: Insufficient documentation

## 2022-12-21 DIAGNOSIS — I5022 Chronic systolic (congestive) heart failure: Secondary | ICD-10-CM | POA: Diagnosis not present

## 2022-12-21 DIAGNOSIS — I152 Hypertension secondary to endocrine disorders: Secondary | ICD-10-CM

## 2022-12-21 DIAGNOSIS — I2510 Atherosclerotic heart disease of native coronary artery without angina pectoris: Secondary | ICD-10-CM | POA: Diagnosis not present

## 2022-12-21 DIAGNOSIS — I130 Hypertensive heart and chronic kidney disease with heart failure and stage 1 through stage 4 chronic kidney disease, or unspecified chronic kidney disease: Secondary | ICD-10-CM | POA: Diagnosis not present

## 2022-12-21 DIAGNOSIS — E1169 Type 2 diabetes mellitus with other specified complication: Secondary | ICD-10-CM

## 2022-12-21 DIAGNOSIS — N184 Chronic kidney disease, stage 4 (severe): Secondary | ICD-10-CM | POA: Diagnosis present

## 2022-12-21 DIAGNOSIS — Z7982 Long term (current) use of aspirin: Secondary | ICD-10-CM | POA: Diagnosis not present

## 2022-12-21 DIAGNOSIS — R9431 Abnormal electrocardiogram [ECG] [EKG]: Secondary | ICD-10-CM | POA: Diagnosis present

## 2022-12-21 DIAGNOSIS — I5042 Chronic combined systolic (congestive) and diastolic (congestive) heart failure: Secondary | ICD-10-CM | POA: Insufficient documentation

## 2022-12-21 DIAGNOSIS — K921 Melena: Principal | ICD-10-CM | POA: Diagnosis present

## 2022-12-21 DIAGNOSIS — I10 Essential (primary) hypertension: Secondary | ICD-10-CM | POA: Diagnosis present

## 2022-12-21 DIAGNOSIS — T50911A Poisoning by multiple unspecified drugs, medicaments and biological substances, accidental (unintentional), initial encounter: Secondary | ICD-10-CM | POA: Diagnosis present

## 2022-12-21 DIAGNOSIS — D649 Anemia, unspecified: Secondary | ICD-10-CM | POA: Diagnosis present

## 2022-12-21 DIAGNOSIS — E785 Hyperlipidemia, unspecified: Secondary | ICD-10-CM

## 2022-12-21 DIAGNOSIS — E1122 Type 2 diabetes mellitus with diabetic chronic kidney disease: Secondary | ICD-10-CM | POA: Diagnosis not present

## 2022-12-21 LAB — CBC WITH DIFFERENTIAL/PLATELET
Abs Immature Granulocytes: 0.01 10*3/uL (ref 0.00–0.07)
Basophils Absolute: 0 10*3/uL (ref 0.0–0.1)
Basophils Relative: 1 %
Eosinophils Absolute: 0.1 10*3/uL (ref 0.0–0.5)
Eosinophils Relative: 2 %
HCT: 26.4 % — ABNORMAL LOW (ref 36.0–46.0)
Hemoglobin: 8.1 g/dL — ABNORMAL LOW (ref 12.0–15.0)
Immature Granulocytes: 0 %
Lymphocytes Relative: 25 %
Lymphs Abs: 1.5 10*3/uL (ref 0.7–4.0)
MCH: 28.1 pg (ref 26.0–34.0)
MCHC: 30.7 g/dL (ref 30.0–36.0)
MCV: 91.7 fL (ref 80.0–100.0)
Monocytes Absolute: 0.5 10*3/uL (ref 0.1–1.0)
Monocytes Relative: 8 %
Neutro Abs: 3.8 10*3/uL (ref 1.7–7.7)
Neutrophils Relative %: 64 %
Platelets: 254 10*3/uL (ref 150–400)
RBC: 2.88 MIL/uL — ABNORMAL LOW (ref 3.87–5.11)
RDW: 13.6 % (ref 11.5–15.5)
WBC: 5.9 10*3/uL (ref 4.0–10.5)
nRBC: 0 % (ref 0.0–0.2)

## 2022-12-21 LAB — BASIC METABOLIC PANEL
Anion gap: 9 (ref 5–15)
BUN: 49 mg/dL — ABNORMAL HIGH (ref 8–23)
CO2: 22 mmol/L (ref 22–32)
Calcium: 8.6 mg/dL — ABNORMAL LOW (ref 8.9–10.3)
Chloride: 109 mmol/L (ref 98–111)
Creatinine, Ser: 4.47 mg/dL — ABNORMAL HIGH (ref 0.44–1.00)
GFR, Estimated: 10 mL/min — ABNORMAL LOW (ref >60)
Glucose, Bld: 139 mg/dL — ABNORMAL HIGH (ref 70–99)
Potassium: 4.3 mmol/L (ref 3.5–5.1)
Sodium: 140 mmol/L (ref 135–145)

## 2022-12-21 LAB — CBG MONITORING, ED: Glucose-Capillary: 130 mg/dL — ABNORMAL HIGH (ref 70–99)

## 2022-12-21 LAB — ABO/RH: ABO/RH(D): O POS

## 2022-12-21 MED ORDER — ACETAMINOPHEN 325 MG PO TABS: 650.0000 mg | ORAL_TABLET | Freq: Four times a day (QID) | ORAL | Status: DC | PRN

## 2022-12-21 MED ORDER — LORATADINE 10 MG PO TABS
10.0000 mg | ORAL_TABLET | Freq: Every day | ORAL | Status: DC
  Administered 2022-12-22: 10 mg via ORAL
  Filled 2022-12-21 (×2): qty 1

## 2022-12-21 MED ORDER — ALBUTEROL SULFATE (2.5 MG/3ML) 0.083% IN NEBU: 2.5000 mg | INHALATION_SOLUTION | RESPIRATORY_TRACT | Status: DC | PRN

## 2022-12-21 MED ORDER — GUAIFENESIN ER 600 MG PO TB12
600.0000 mg | ORAL_TABLET | Freq: Two times a day (BID) | ORAL | Status: DC
  Administered 2022-12-22: 600 mg via ORAL
  Filled 2022-12-21: qty 1

## 2022-12-21 MED ORDER — LABETALOL HCL 5 MG/ML IV SOLN: 10.0000 mg | INTRAVENOUS | Status: DC | PRN

## 2022-12-21 MED ORDER — ONDANSETRON HCL 4 MG/2ML IJ SOLN: 4.0000 mg | Freq: Four times a day (QID) | INTRAMUSCULAR | Status: DC | PRN

## 2022-12-21 MED ORDER — INSULIN ASPART 100 UNIT/ML IJ SOLN: 0.0000 [IU] | INTRAMUSCULAR | Status: DC

## 2022-12-21 MED ORDER — SODIUM CHLORIDE 0.9 % IV SOLN: 250.0000 mL | INTRAVENOUS | Status: DC | PRN

## 2022-12-21 MED ORDER — SODIUM CHLORIDE 0.9% FLUSH: 3.0000 mL | INTRAVENOUS | Status: DC | PRN

## 2022-12-21 MED ORDER — ONDANSETRON HCL 4 MG PO TABS: 4.0000 mg | ORAL_TABLET | Freq: Four times a day (QID) | ORAL | Status: DC | PRN

## 2022-12-21 MED ORDER — PANTOPRAZOLE SODIUM 40 MG IV SOLR
40.0000 mg | Freq: Two times a day (BID) | INTRAVENOUS | Status: DC
  Administered 2022-12-22 (×2): 40 mg via INTRAVENOUS
  Filled 2022-12-21 (×2): qty 10

## 2022-12-21 MED ORDER — ACETAMINOPHEN 650 MG RE SUPP: 650.0000 mg | Freq: Four times a day (QID) | RECTAL | Status: DC | PRN

## 2022-12-21 MED ORDER — HYDROCODONE-ACETAMINOPHEN 5-325 MG PO TABS: 1.0000 | ORAL_TABLET | ORAL | Status: DC | PRN

## 2022-12-21 MED ORDER — ROSUVASTATIN CALCIUM 20 MG PO TABS
40.0000 mg | ORAL_TABLET | Freq: Every day | ORAL | Status: DC
  Administered 2022-12-22: 40 mg via ORAL
  Filled 2022-12-21: qty 2

## 2022-12-21 MED ORDER — SODIUM CHLORIDE 0.9% FLUSH
3.0000 mL | Freq: Two times a day (BID) | INTRAVENOUS | Status: DC
  Administered 2022-12-22: 3 mL via INTRAVENOUS

## 2022-12-21 NOTE — ED Provider Notes (Signed)
[place] EMERGENCY DEPARTMENT AT [facility] Provider Note   CSN: [id number] Arrival date & time: 12/21/22  2011     History  Chief Complaint  Patient presents with   Drug Overdose    Pt arrived via ems from [facility] stating she may have accidentally taken both her day and night medication doses all at once with dinner. Pt on betablockers and numerous other medications    Teresa Poole is a 72 y.o. female with a past medical history of type 2 diabetes, heart failure, hypertension, adjustment disorder presenting today with a concern for drug overdose.  She reports that she was at [facility] and took all of her medications at the same time because she thought it was time.  She says that she never makes mistakes like these when it comes to her medication.  She is asymptomatic but wanted to make sure she got checked out.  When asked if this was intentional she said "no I am not that kind of person, I would never hurt myself, I might mess somebody else up though!"   Drug Overdose       Home Medications Prior to Admission medications   Medication Sig Start Date End Date Taking? Authorizing Provider  amLODipine (NORVASC) 10 MG tablet Take 1 tablet (10 mg total) by mouth daily. 11/14/22   Regalado, Jon Billings A, MD  aspirin EC 81 MG tablet Take 1 tablet (81 mg total) by mouth daily. Swallow whole. 11/14/22   Regalado, Belkys A, MD  azelastine (ASTELIN) 0.1 % nasal spray Place 2 sprays into both nostrils 2 (two) times daily. 12/02/22   [provider]  carvedilol (COREG) 25 MG tablet Take 1 tablet (25 mg total) by mouth 2 (two) times daily with a meal. 11/13/22   Regalado, Belkys A, MD  cetirizine (ZYRTEC) 10 MG tablet Take 10 mg by mouth daily as needed for allergies. 11/19/22   [provider]  cyanocobalamin 1000 MCG tablet Take 1 tablet (1,000 mcg total) by mouth daily. 11/14/22   Regalado, Belkys A, MD  docusate sodium (COLACE) 100 MG capsule Take 1 capsule  (100 mg total) by mouth 2 (two) times daily. 11/13/22   Regalado, Belkys A, MD  famotidine (PEPCID) 40 MG tablet Take 40 mg by mouth daily. 09/04/21   [provider]  fluticasone (FLONASE) 50 MCG/ACT nasal spray Place 2 sprays into both nostrils daily. 11/19/22   [provider]  glucose blood (ONE TOUCH ULTRA TEST) test strip Use to check blood sugar 3 times daily Dx code E11.65 Patient taking differently: 1 each by Other route See admin instructions. Use to check blood sugar 3 times daily Dx code E11.65 11/20/14   Reather Littler, MD  iron polysaccharides (NIFEREX) 150 MG capsule Take 1 capsule (150 mg total) by mouth daily. 11/14/22   Regalado, Belkys A, MD  isosorbide mononitrate (IMDUR) 60 MG 24 hr tablet Take 1 tablet (60 mg total) by mouth daily. 12/14/22   Sharlene Dory, PA-C  loratadine (CLARITIN) 10 MG tablet Take 1 tablet (10 mg total) by mouth daily. 12/04/22 01/03/23  Curatolo, Adam, DO  omeprazole (PRILOSEC) 40 MG capsule Take 40 mg by mouth daily. 03/10/22   [provider]  pantoprazole (PROTONIX) 20 MG tablet Take 1 tablet (20 mg total) by mouth daily. 11/29/22 03/29/23  Schutt, Edsel Petrin, PA-C  polyethylene glycol (MIRALAX / GLYCOLAX) 17 g packet Take 17 g by mouth daily. 09/28/21   Rodolph Bong, MD  QUEtiapine (SEROQUEL) 25 MG tablet Take 1 tablet (25 mg total) by mouth 2 (two) times daily as needed (Agitation). 11/13/22   Regalado, Belkys A, MD  QUEtiapine (SEROQUEL) 50 MG tablet Take 1 tablet (50 mg total) by mouth at bedtime. 11/13/22   Regalado, Belkys A, MD  rosuvastatin (CRESTOR) 40 MG tablet Take 1 tablet (40 mg total) by mouth daily. 12/17/22   Sharlene Dory, PA-C  sucralfate (CARAFATE) 1 g tablet Take 1 tablet (1 g total) by mouth 4 (four) times daily -  with meals and at bedtime for 5 days. 11/29/22 12/04/22  Schutt, Edsel Petrin, PA-C  thiamine (VITAMIN B-1) 100 MG tablet Take 1 tablet (100 mg total) by mouth daily. 11/14/22   Regalado, Jon Billings A, MD       Allergies    Hydralazine hcl and Atorvastatin    Review of Systems   Review of Systems  Physical Exam Updated Vital Signs SpO2 99%  Physical Exam Vitals and nursing note reviewed.  Constitutional:      Appearance: Normal appearance.  HENT:     Head: Normocephalic and atraumatic.  Eyes:     General: No scleral icterus.    Conjunctiva/sclera: Conjunctivae normal.  Cardiovascular:     Rate and Rhythm: Normal rate and regular rhythm.  Pulmonary:     Effort: Pulmonary effort is normal. No respiratory distress.  Skin:    Findings: No rash.  Neurological:     Mental Status: She is alert.  Psychiatric:        Mood and Affect: Mood normal.     ED Results / Procedures / Treatments   Labs (all labs ordered are listed, but only abnormal results are displayed) Labs Reviewed  CBC WITH DIFFERENTIAL/PLATELET - Abnormal; Notable for the following components:      Result Value   RBC 2.88 (*)    Hemoglobin 8.1 (*)    HCT 26.4 (*)    All other components within normal limits  BASIC METABOLIC PANEL - Abnormal; Notable for the following components:   Glucose, Bld 139 (*)    BUN 49 (*)    Creatinine, Ser 4.47 (*)    Calcium 8.6 (*)    GFR, Estimated 10 (*)    All other components within normal limits  CK  MAGNESIUM  PHOSPHORUS  TSH  URINALYSIS, COMPLETE (UACMP) WITH MICROSCOPIC  VITAMIN B12  FOLATE  IRON AND TIBC  FERRITIN  RETICULOCYTES  POC OCCULT BLOOD, ED  ABO/RH  TYPE AND SCREEN    EKG None  Radiology No results found.  Procedures Procedures   Medications Ordered in ED Medications  pantoprazole (PROTONIX) injection 40 mg (has no administration in time range)    ED Course/ Medical Decision Making/ A&P Clinical Course as of 12/21/22 2149  Mon Dec 21, 2022  2149 Hemoglobin(!): 8.1 [MR]    Clinical Course User Index [MR] Saddie Benders, PA-C                             Medical Decision Making Amount and/or Complexity of Data Reviewed Labs:  ordered. Decision-making details documented in ED Course.  Risk Prescription drug management. Decision regarding hospitalization.   72 year old female with concern for taking all of her medication at the wrong time.   Additional history: Per chart review patient is on: Amlodipine, Imdur and carvedilol for hypertension Rosuvastatin for cholesterol Seroquel for mood disturbance   Physical Exam: Pertinent physical exam findings include RRR  Anxious  Lab Tests: I ordered, and personally interpreted labs.  The pertinent results include: Hemoglobin 8.1, patient's baseline appears to be around 9 Baseline kidney function     Cardiac Monitoring:  The patient was maintained on a cardiac monitor.  I viewed and interpreted the cardiac monitored which showed an underlying rhythm of: Sinus   Medications: Protonix   Consultations Obtained: Sent secure chat to [facility] after-hours  MDM/Disposition: This is a 72 year old female who presents today due to taking all of her medication at the same time.  She reports this is unintentional.  She was asymptomatic but wanted to be checked out.  EKG is reassuring without any ischemic findings.  Lab work revealed a hemoglobin of 8.1.  Patient does have anemia of chronic disease however baseline appears to be around 9.  2 weeks ago her hemoglobin was 9.  Patient denies dark stools, hematemesis or hematochezia however I did obtain occult blood test which was liquidy and pitch black.  I again asked the patient about dark stools and she says that she has been having stools that have been dark black for the past 1 to 2 weeks.  Daily aspirin but no thinners.  Denies alcohol use or history of GI bleeding.  Only gastro visits in the past were for GERD in 2022, otherwise no noted colonoscopy or endoscopy in the [facility] system.  Originally I planned to discharge the patient home after normal lab work however due to her hemoglobin drop and clear melena on physical  exam I believe she requires admission for workup for a GI bleed.  Hospitalist paged and gastroenterologist Dr. Myrtie Neither notified via secure chat and will add the patient to the [facility] list.  Final Clinical Impression(s) / ED Diagnoses Final diagnoses:  Gastrointestinal hemorrhage with melena    Rx / DC Orders ED Discharge Orders     None      Admit to Dr. Sande Rives, [place] A, PA-C 12/21/22 2326    Charlynne Pander, MD 12/21/22 [phone]

## 2022-12-21 NOTE — Assessment & Plan Note (Signed)
Currently appears to be euvolemic.  Unclear if patient on Lasix.  Continue to monitor fluid status may need Lasix dose if blood is transfused

## 2022-12-21 NOTE — Assessment & Plan Note (Signed)
Order sliding scale monitor blood sugars 

## 2022-12-21 NOTE — Assessment & Plan Note (Signed)
-  chronic avoid nephrotoxic medications such as NSAIDs, Vanco Zosyn combo,  avoid hypotension, continue to follow renal function Followed by nephrology

## 2022-12-21 NOTE — H&P (Signed)
[name]:[id number] DOB: 1951-08-09 DOA: 12/21/2022     PCP: Malka So., MD   Outpatient Specialists:  CARDS:   Dr. Lance Muss, MD NEphrology: Dr. Verna Czech     Patient arrived to ER on 12/21/22 at 2011 Referred by Attending Charlynne Pander, MD   Patient coming from:    home Lives alone,    Chief Complaint:   Chief Complaint  Patient presents with   Drug Overdose    Pt arrived via ems from [facility] stating she may have accidentally taken both her day and night medication doses all at once with dinner. Pt on betablockers and numerous other medications at about 730pm tonight    HPI: Teresa Poole is a 72 y.o. female with medical history significant of DM2, history of carotid stenosis, chronic combined systolic and diastolic CHF EF was 30 to 35%  , CKD stage IV   diagnosed with OSA but did not get CPAP.  Carotid stenosis, history of ovarian cancer HLD HTN mild CAD renal artery stenosis  Presented with   unintentional overdose Pt came in from a restaurant  by EMS bc she is concerned that she took both her AM amd PM meds together. Pt states this was unintentional and she has no symptoms Pt endorsed black stools for the past 1-2 wks She takes daily aspirin   Has been seen by cardiology last week after the visit her blood pressure was in 200s she was encouraged to buy blood pressure cuff to continue monitor blood pressure and her Imdur was increased Patient has frequent ER visits show she is a bit confused about what medication she is supposed to be taking  Denies suicidal ideation, pt is very upset about having IV access, the need for IV was explained to the patient by multiple providers  Pt states she takes about 12 -16 pills and she takes them randomly throughout the day She does not know what they are or when to take them  Denies significant ETOH intake   Does not smoke   Lab Results  Component Value Date   SARSCOV2NAA NEGATIVE  12/07/2022   SARSCOV2NAA NEGATIVE 09/20/2022   SARSCOV2NAA NEGATIVE 08/30/2022   SARSCOV2NAA NEGATIVE 07/21/2022    Regarding pertinent Chronic problems:     Hyperlipidemia - on statins Crestor Lipid Panel     Component Value Date/Time   CHOL 195 12/14/2022 1016   TRIG 124 12/14/2022 1016   HDL 61 12/14/2022 1016   CHOLHDL 3.2 12/14/2022 1016   CHOLHDL 3.0 04/18/2022 0154   VLDL 21 04/18/2022 0154   LDLCALC 112 (H) 12/14/2022 1016   LDLDIRECT 111.0 09/20/2019 1337   LABVLDL 22 12/14/2022 1016     HTN on imdur, norvasc, coreg Lasix?   chronic CHF diastolic/systolic/ combined - On lasix ?  last echo  Recent Results (from the past 81191 hour(s))  ECHOCARDIOGRAM COMPLETE   Collection Time: 11/06/22 10:34 AM  Result Value   Weight 2,479.73   Height 66   BP 147/63   Single Plane A2C EF 49.4   Single Plane A4C EF 47.8   Calc EF 48.6   S' Lateral 3.90   P 1/2 time 395   Area-P 1/2 2.84   Est EF 45 - 50%   Narrative      ECHOCARDIOGRAM REPORT      IMPRESSIONS    1. Left ventricular ejection fraction, by estimation, is 45 to 50%. The left ventricle has mildly decreased function. The left  ventricle demonstrates global hypokinesis. There is mild concentric left ventricular hypertrophy. Left ventricular diastolic  parameters are consistent with Grade II diastolic dysfunction (pseudonormalization).  2. Right ventricular systolic function is normal. The right ventricular size is normal.  3. Left atrial size was moderately dilated.  4. The mitral valve is grossly normal. Trivial mitral valve regurgitation. No evidence of mitral stenosis.  5. The aortic valve is tricuspid. There is mild calcification of the aortic valve. There is mild thickening of the aortic valve. Aortic valve regurgitation is trivial. Aortic valve sclerosis/calcification is present, without any evidence of aortic  stenosis.  6. The inferior vena cava is normal in size with greater than 50% respiratory  variability, suggesting right atrial pressure of 3 mmHg.           CAD  - On Aspirin, statin, betablocker,                 - followed by cardiology                - last cardiac cath 2015       DM 2 -  Lab Results  Component Value Date   HGBA1C 6.3 (H) 11/05/2022    diet controlled     OSA - noncompliant with CPAP    CKD stage Iv baseline Cr 4 Estimated Creatinine Clearance: 11.4 mL/min (A) (by C-G formula based on SCr of 4.47 mg/dL (H)).  Lab Results  Component Value Date   CREATININE 4.47 (H) 12/21/2022   CREATININE 4.13 (H) 12/07/2022   CREATININE 3.80 (H) 12/04/2022     Chronic anemia - baseline hg Hemoglobin & Hematocrit  Recent Labs    12/04/22 1329 12/07/22 0711 12/21/22 2120  HGB 9.3* 9.0* 8.1*   Iron/TIBC/Ferritin/ %Sat    Component Value Date/Time   IRON 33 11/06/2022 1929   TIBC 300 11/06/2022 1929   FERRITIN 21 11/06/2022 1929   IRONPCTSAT 11 11/06/2022 1929     While in ER: Clinical Course as of 12/21/22 2310  Mon Dec 21, 2022  2149 Hemoglobin(!): 8.1 [MR]    Clinical Course User Index [MR] Redwine, Madison A, PA-C     Lab Orders         CBC with Differential         Basic metabolic panel         POC occult blood, ED        Following Medications were ordered in ER: Medications  pantoprazole (PROTONIX) injection 40 mg (has no administration in time range)    _______________________________________________________ ER Provider Called:  [facility]   Dr. Meda Klinefelter  They Recommend admit to medicine   Will see in AM     ED Triage Vitals  Enc Vitals Group     BP 12/21/22 2021 (!) 160/60     Pulse Rate 12/21/22 2021 73     Resp 12/21/22 2021 18     Temp 12/21/22 2021 98.1 F (36.7 C)     Temp Source 12/21/22 2021 Oral     SpO2 12/21/22 2016 99 %     Weight 12/21/22 2017 150 lb (68 kg)     Height 12/21/22 2017 5\' 7"  (1.702 m)     Head Circumference --      Peak Flow --      Pain Score 12/21/22 2017 0     Pain Loc --      Pain Edu? --       Excl. in GC? --  ZOXW(96)@     _________________________________________ Significant initial  Findings: Abnormal Labs Reviewed  CBC WITH DIFFERENTIAL/PLATELET - Abnormal; Notable for the following components:      Result Value   RBC 2.88 (*)    Hemoglobin 8.1 (*)    HCT 26.4 (*)    All other components within normal limits  BASIC METABOLIC PANEL - Abnormal; Notable for the following components:   Glucose, Bld 139 (*)    BUN 49 (*)    Creatinine, Ser 4.47 (*)    Calcium 8.6 (*)    GFR, Estimated 10 (*)    All other components within normal limits      ECG: Ordered Personally reviewed and interpreted by me showing: HR : 69 Rhythm: Sinus rhythm Right bundle branch block Probable left ventricular hypertrophy Borderline prolonged QT interval QTC 493  BNP (last 3 results) Recent Labs    03/07/22 1215 08/30/22 1208  BNP 27.6 58.5     COVID-19 Labs  No results for input(s): "DDIMER", "FERRITIN", "LDH", "CRP" in the last 72 hours.  Lab Results  Component Value Date   SARSCOV2NAA NEGATIVE 12/07/2022   SARSCOV2NAA NEGATIVE 09/20/2022   SARSCOV2NAA NEGATIVE 08/30/2022   SARSCOV2NAA NEGATIVE 07/21/2022     The recent clinical data is shown below. Vitals:   12/21/22 2016 12/21/22 2017 12/21/22 2021 12/21/22 2130  BP:   (!) 160/60 (!) 144/63  Pulse:   73 62  Resp:   18 17  Temp:   98.1 F (36.7 C)   TempSrc:   Oral   SpO2: 99%  100% 100%  Weight:  68 kg    Height:  5\' 7"  (1.702 m)      WBC     Component Value Date/Time   WBC 5.9 12/21/2022 2120   LYMPHSABS 1.5 12/21/2022 2120   MONOABS 0.5 12/21/2022 2120   EOSABS 0.1 12/21/2022 2120   BASOSABS 0.0 12/21/2022 2120      UA ordered    _______________________________________________ Recent Labs  Lab 12/21/22 2120  NA 140  K 4.3  CO2 22  GLUCOSE 139*  BUN 49*  CREATININE 4.47*  CALCIUM 8.6*    Cr  Up from baseline see below Lab Results  Component Value Date   CREATININE 4.47 (H) 12/21/2022    CREATININE 4.13 (H) 12/07/2022   CREATININE 3.80 (H) 12/04/2022    Lab Results  Component Value Date   CALCIUM 8.6 (L) 12/21/2022  Plt: Lab Results  Component Value Date   PLT 254 12/21/2022     Recent Labs  Lab 12/21/22 2120  WBC 5.9  NEUTROABS 3.8  HGB 8.1*  HCT 26.4*  MCV 91.7  PLT 254    HG/HCT   Down  from baseline see below    Component Value Date/Time   HGB 8.1 (L) 12/21/2022 2120   HCT 26.4 (L) 12/21/2022 2120   MCV 91.7 12/21/2022 2120   _______________________________________________ Hospitalist was called for admission for  Gastrointestinal hemorrhage with melena  The following Work up has been ordered so far:  Orders Placed This Encounter  Procedures   CBC with Differential   Basic metabolic panel   Diet NPO time specified   Consult for Unassigned Medical Admission   POC occult blood, ED   ED EKG   EKG 12-Lead   Type and screen [facility]   ABO/Rh     OTHER Significant initial  Findings:  labs showing:     DM  labs:  HbA1C: Recent Labs  04/18/22 0154 11/05/22 1100  HGBA1C 6.1* 6.3*       CBG (last 3)  Recent Labs    12/21/22 2354  GLUCAP 130*          Cultures:    Component Value Date/Time   SDES  10/25/2021 1322    BLOOD RIGHT HAND Performed at [facility], [address], [place], [place] [address]    [facility]  10/25/2021 1322    BOTTLES DRAWN AEROBIC AND ANAEROBIC Blood Culture adequate volume Performed at [facility], [address], [place], [place] [address]    CULT  10/25/2021 1322    NO GROWTH 5 DAYS Performed at [facility], [address] [place], [place] [address]    REPTSTATUS 10/30/2021 FINAL 10/25/2021 1322     Radiological Exams on Admission: No results found. _______________________________________________________________________________________________________ Latest  Blood pressure (!) 144/63, pulse 62, temperature 98.1 F (36.7 C),  temperature source Oral, resp. rate 17, height 5\' 7"  (1.702 m), weight 68 kg, SpO2 100 %.   Vitals  labs and radiology finding personally reviewed  Review of Systems:    Pertinent positives include:  fatigue,  Constitutional:  No weight loss, night sweats, Fevers, chills,  weight loss  HEENT:  No headaches, Difficulty swallowing,Tooth/dental problems,Sore throat,  No sneezing, itching, ear ache, nasal congestion, post nasal drip,  Cardio-vascular:  No chest pain, Orthopnea, PND, anasarca, dizziness, palpitations.no Bilateral lower extremity swelling  GI:  No heartburn, indigestion, abdominal pain, nausea, vomiting, diarrhea, change in bowel habits, loss of appetite, melena, blood in stool, hematemesis Resp:  no shortness of breath at rest. No dyspnea on exertion, No excess mucus, no productive cough, No non-productive cough, No coughing up of blood.No change in color of mucus.No wheezing. Skin:  no rash or lesions. No jaundice GU:  no dysuria, change in color of urine, no urgency or frequency. No straining to urinate.  No flank pain.  Musculoskeletal:  No joint pain or no joint swelling. No decreased range of motion. No back pain.  Psych:  No change in mood or affect. No depression or anxiety. No memory loss.  Neuro: no localizing neurological complaints, no tingling, no weakness, no double vision, no gait abnormality, no slurred speech, no confusion  All systems reviewed and apart from HOPI all are negative _______________________________________________________________________________________________ Past Medical History:   Past Medical History:  Diagnosis Date   Allergy    Anemia    Carotid stenosis    Chronic combined systolic and diastolic CHF (congestive heart failure) (HCC)    CKD (chronic kidney disease), stage IV (HCC) 09/24/2013   Pt at [facility], Dr. Marisue Humble   COVID-19 10/24/2021   Diabetes mellitus type 2 in nonobese [facility])    Edema 09/14/2013   GERD  (gastroesophageal reflux disease)    History of ovarian cancer    Hyperlipidemia    Hypertension    Mild CAD    non-obstructive by LHC (09/25/2013): Proximal and mid LAD serial 20%, proximal circumflex 30%, mid AV groove circumflex 30%, mid RCA mild plaque.   NICM (nonischemic cardiomyopathy) (HCC)    Obesity (BMI 30-39.9)    RBBB    Renal artery stenosis (HCC) 10/11/2018   Thyroid disease    Seen by specialist      Past Surgical History:  Procedure Laterality Date   LEFT HEART CATHETERIZATION WITH CORONARY ANGIOGRAM N/A 09/25/2013   Procedure: LEFT HEART CATHETERIZATION WITH CORONARY ANGIOGRAM;  Surgeon: Kathleene Hazel, MD;  Location:  [facility] CATH LAB;  Service: Cardiovascular;  Laterality: N/A;   RENAL ANGIOGRAPHY N/A 10/06/2018   Procedure: RENAL ANGIOGRAPHY;  Surgeon: Cephus Shelling, MD;  Location: [facility] INVASIVE CV LAB;  Service: Cardiovascular;  Laterality: N/A;    Social History:  Ambulatory   independently     reports that she has never smoked. She has never used smokeless tobacco. She reports that she does not drink alcohol and does not use drugs.    Family History:  Family History  Problem Relation Age of Onset   Diabetes Father    Hypertension Father    Hypertension Maternal Grandfather    ______________________________________________________________________________________________ Allergies: Allergies  Allergen Reactions   Hydralazine Hcl Itching    ENTIRE BODY = burning sensation, also in the eyes (they have become very sensitive to light)   Atorvastatin Other (See Comments)    Per MD - pt not sure of reaction      Prior to Admission medications   Medication Sig Start Date End Date Taking? Authorizing Provider  amLODipine (NORVASC) 10 MG tablet Take 1 tablet (10 mg total) by mouth daily. 11/14/22   Regalado, Jon Billings A, MD  aspirin EC 81 MG tablet Take 1 tablet (81 mg total) by mouth daily. Swallow whole. 11/14/22   Regalado, Belkys A, MD  azelastine  (ASTELIN) 0.1 % nasal spray Place 2 sprays into both nostrils 2 (two) times daily. 12/02/22   [provider]  carvedilol (COREG) 25 MG tablet Take 1 tablet (25 mg total) by mouth 2 (two) times daily with a meal. 11/13/22   Regalado, Belkys A, MD  cetirizine (ZYRTEC) 10 MG tablet Take 10 mg by mouth daily as needed for allergies. 11/19/22   [provider]  cyanocobalamin 1000 MCG tablet Take 1 tablet (1,000 mcg total) by mouth daily. 11/14/22   Regalado, Belkys A, MD  docusate sodium (COLACE) 100 MG capsule Take 1 capsule (100 mg total) by mouth 2 (two) times daily. 11/13/22   Regalado, Belkys A, MD  famotidine (PEPCID) 40 MG tablet Take 40 mg by mouth daily. 09/04/21   [provider]  fluticasone (FLONASE) 50 MCG/ACT nasal spray Place 2 sprays into both nostrils daily. 11/19/22   [provider]  glucose blood (ONE TOUCH ULTRA TEST) test strip Use to check blood sugar 3 times daily Dx code E11.65 Patient taking differently: 1 each by Other route See admin instructions. Use to check blood sugar 3 times daily Dx code E11.65 11/20/14   Reather Littler, MD  iron polysaccharides (NIFEREX) 150 MG capsule Take 1 capsule (150 mg total) by mouth daily. 11/14/22   Regalado, Belkys A, MD  isosorbide mononitrate (IMDUR) 60 MG 24 hr tablet Take 1 tablet (60 mg total) by mouth daily. 12/14/22   Sharlene Dory, PA-C  loratadine (CLARITIN) 10 MG tablet Take 1 tablet (10 mg total) by mouth daily. 12/04/22 01/03/23  Curatolo, Adam, DO  omeprazole (PRILOSEC) 40 MG capsule Take 40 mg by mouth daily. 03/10/22   [provider]  pantoprazole (PROTONIX) 20 MG tablet Take 1 tablet (20 mg total) by mouth daily. 11/29/22 03/29/23  Schutt, Edsel Petrin, PA-C  polyethylene glycol (MIRALAX / GLYCOLAX) 17 g packet Take 17 g by mouth daily. 09/28/21   Rodolph Bong, MD  QUEtiapine (SEROQUEL) 25 MG tablet Take 1 tablet (25 mg total) by mouth 2 (two) times daily as needed (Agitation). 11/13/22   Regalado,  Belkys A, MD  QUEtiapine (SEROQUEL) 50 MG tablet Take 1 tablet (50 mg  total) by mouth at bedtime. 11/13/22   Regalado, Belkys A, MD  rosuvastatin (CRESTOR) 40 MG tablet Take 1 tablet (40 mg total) by mouth daily. 12/17/22   Sharlene Dory, PA-C  sucralfate (CARAFATE) 1 g tablet Take 1 tablet (1 g total) by mouth 4 (four) times daily -  with meals and at bedtime for 5 days. 11/29/22 12/04/22  Schutt, Edsel Petrin, PA-C  thiamine (VITAMIN B-1) 100 MG tablet Take 1 tablet (100 mg total) by mouth daily. 11/14/22   Alba Cory, MD    ___________________________________________________________________________________________________ Physical Exam:    12/21/2022    9:30 PM 12/21/2022    8:21 PM 12/21/2022    8:17 PM  Vitals with BMI  Height   5\' 7"   Weight   150 lbs  BMI   23.49  Systolic 144 160   Diastolic 63 60   Pulse 62 73      1. General:  in No  Acute distress    Chronically ill   2. Psychological: Alert and   Oriented 3. Head/ENT:    Dry Mucous Membranes                          Head Non traumatic, neck supple                           Poor Dentition 4. SKIN: normal  Skin turgor,  Skin clean Dry and intact no rash    5. Heart: Regular rate and rhythm no  Murmur, no Rub or gallop 6. Lungs:  Clear to auscultation bilaterally, no wheezes or crackles   7. Abdomen: Soft,  non-tender, Non distended   obese  bowel sounds present 8. Lower extremities: no clubbing, cyanosis, trace edema 9. Neurologically Grossly intact, moving all 4 extremities equally   10. MSK: Normal range of motion    Chart has been reviewed  ______________________________________________________________________________________________  Assessment/Plan  72 y.o. female with medical history significant of DM2, history of carotid stenosis, chronic combined systolic and diastolic CHF EF was 30 to 35%  , CKD stage IV   diagnosed with OSA but did not get CPAP.  Carotid stenosis, history of ovarian cancer HLD HTN mild  CAD renal artery stenosis    Admitted for   Gastrointestinal hemorrhage with melena     Present on Admission:  Melena  Anemia  Hyperlipidemia associated with type 2 diabetes mellitus (HCC)  Type 2 diabetes mellitus with chronic kidney disease, without long-term current use of insulin (HCC)  Chronic systolic heart failure (HCC)  Hypertension associated with diabetes (HCC)  CKD (chronic kidney disease), stage IV (HCC)  QT prolongation   Melena  - Glasgow Blatchford score BUN >18.2 *, Hg <18F  melena     >1 Justifies admission and aggressive management      Modifying risk factors include:     NSAIDS use       -  ER  Provider spoke to gastroenterology ( [facility]) they will see patient in a.m. appreciate their consult   - serial CBC.    - Monitor for any recurrence,  evidence of hemodynamic instability or significant blood loss  - Transfuse as needed for hemoglobin below 7 or evidence of life-threatening bleeding  - Establish at least 2 PIV and fluid resuscitate   - clear liquids for tonight keep nothing by mouth post midnight,   -  administer Protonix   twice a day  Anemia To monitor serial CBC Stool melanotic Transfuse as needed for hemoglobin below 7 or dropping  Hyperlipidemia associated with type 2 diabetes mellitus (HCC) Unclear what medicines she took excess of breath safe to resume Crestor in the morning at 40 mg a day  Type 2 diabetes mellitus with chronic kidney disease, without long-term current use of insulin (HCC) Order sliding scale monitor blood sugars  Chronic systolic heart failure (HCC) Currently appears to be euvolemic.  Unclear if patient on Lasix.  Continue to monitor fluid status may need Lasix dose if blood is transfused  Hypertension associated with diabetes (HCC) Unclear what medication patient took in excess of Hold off on home meds for tonight unless becomes hypertensive .  Labetalol as needed Resume home medicines as able to tolerate  CKD  (chronic kidney disease), stage IV (HCC)  -chronic avoid nephrotoxic medications such as NSAIDs, Vanco Zosyn combo,  avoid hypotension, continue to follow renal function Followed by nephrology  QT prolongation - will monitor on tele avoid QT prolonging medications, rehydrate correct electrolytes   Other plan as per orders.  DVT prophylaxis:  SCD        Code Status:    Code Status: Prior FULL CODE  as per patient  I had personally discussed CODE STATUS with patient   ACP has been reviewed     Family Communication:   Family not at  Bedside    Diet  Diet Orders (From admission, onward)     Start     Ordered   12/22/22 0001  Diet NPO time specified  Diet effective midnight        12/21/22 2244            Disposition Plan:        To home once workup is complete and patient is stable   Following barriers for discharge:                                            Anemia stable                                                      Will need to be able to tolerate PO                            Will likely need home health,                             Will need consultants to evaluate patient prior to discharge                      Would benefit from PT/OT eval prior to DC to evaluate for safety at home ordered                                      Transition of care consulted                   Nutrition    consulted  Consults called: [facility] Treatment Team:  Shellia Cleverly, DO  Admission status:  ED Disposition     ED Disposition  Admit   Condition  --   Comment  The patient appears reasonably stabilized for admission considering the current resources, flow, and capabilities available in the ED at this time, and I doubt any other [facility] requiring further screening and/or treatment in the ED prior to admission is  present.            inpatient     I Expect 2 midnight stay secondary to severity of patient's current illness need  for inpatient interventions justified by the following:    Severe lab/radiological/exam abnormalities including:    Melena symptomatic anemia and extensive comorbidities including:  DM2   CHF   CAD  CKD  That are currently affecting medical management.   I expect  patient to be hospitalized for 2 midnights requiring inpatient medical care.  Patient is at high risk for adverse outcome (such as loss of life or disability) if not treated.  Indication for inpatient stay as follows:    Need for operative/procedural  intervention    Need for IV fluids, iv protonix    Level of care     tele  For  24H       Lab Results  Component Value Date   SARSCOV2NAA NEGATIVE 12/07/2022    Chloie Loney 12/22/2022, 12:02 AM    [facility]     after 2 AM please page floor coverage PA If 7AM-7PM, please contact the day team taking care of the patient using [email or web address]

## 2022-12-21 NOTE — Assessment & Plan Note (Addendum)
To monitor serial CBC Stool melanotic Transfuse as needed for hemoglobin below 7 or dropping

## 2022-12-21 NOTE — Assessment & Plan Note (Signed)
-   Glasgow Blatchford score BUN >18.2 *, Hg <41F  melena     >1 Justifies admission and aggressive management      Modifying risk factors include:     NSAIDS use       -  ER  Provider spoke to gastroenterology ( [facility]) they will see patient in a.m. appreciate their consult   - serial CBC.    - Monitor for any recurrence,  evidence of hemodynamic instability or significant blood loss  - Transfuse as needed for hemoglobin below 7 or evidence of life-threatening bleeding  - Establish at least 2 PIV and fluid resuscitate   - clear liquids for tonight keep nothing by mouth post midnight,   -  administer Protonix   twice a day

## 2022-12-21 NOTE — Assessment & Plan Note (Signed)
-   will monitor on tele avoid QT prolonging medications, rehydrate correct electrolytes  

## 2022-12-21 NOTE — Assessment & Plan Note (Signed)
Unclear what medicines she took excess of breath safe to resume Crestor in the morning at 40 mg a day

## 2022-12-21 NOTE — Assessment & Plan Note (Signed)
Unclear what medication patient took in excess of Hold off on home meds for tonight unless becomes hypertensive .  Labetalol as needed Resume home medicines as able to tolerate

## 2022-12-21 NOTE — Subjective & Objective (Signed)
Pt came in from a restaurant  by EMS bc she is concerned that she took both her AM amd PM meds together. Pt states this was unintentional and she has no symptoms Pt endorsed black stools for the past 1-2 wks She takes daily aspirin

## 2022-12-22 ENCOUNTER — Inpatient Hospital Stay (HOSPITAL_COMMUNITY): Payer: Medicare PPO | Admitting: Certified Registered Nurse Anesthetist

## 2022-12-22 ENCOUNTER — Other Ambulatory Visit: Payer: Self-pay

## 2022-12-22 ENCOUNTER — Encounter (HOSPITAL_COMMUNITY): Payer: Self-pay | Admitting: Internal Medicine

## 2022-12-22 ENCOUNTER — Inpatient Hospital Stay (HOSPITAL_BASED_OUTPATIENT_CLINIC_OR_DEPARTMENT_OTHER): Payer: Medicare PPO | Admitting: Certified Registered Nurse Anesthetist

## 2022-12-22 ENCOUNTER — Encounter (HOSPITAL_COMMUNITY)
Admission: EM | Disposition: A | Discharge status: Home / Self Care | Payer: Self-pay | Source: Home / Self Care | Attending: Emergency Medicine

## 2022-12-22 DIAGNOSIS — Z794 Long term (current) use of insulin: Secondary | ICD-10-CM

## 2022-12-22 DIAGNOSIS — K2270 Barrett's esophagus without dysplasia: Secondary | ICD-10-CM | POA: Diagnosis not present

## 2022-12-22 DIAGNOSIS — K922 Gastrointestinal hemorrhage, unspecified: Secondary | ICD-10-CM | POA: Diagnosis not present

## 2022-12-22 DIAGNOSIS — D62 Acute posthemorrhagic anemia: Secondary | ICD-10-CM | POA: Diagnosis not present

## 2022-12-22 DIAGNOSIS — I110 Hypertensive heart disease with heart failure: Secondary | ICD-10-CM

## 2022-12-22 DIAGNOSIS — I2510 Atherosclerotic heart disease of native coronary artery without angina pectoris: Secondary | ICD-10-CM

## 2022-12-22 DIAGNOSIS — D649 Anemia, unspecified: Secondary | ICD-10-CM | POA: Diagnosis not present

## 2022-12-22 DIAGNOSIS — K2289 Other specified disease of esophagus: Secondary | ICD-10-CM | POA: Diagnosis not present

## 2022-12-22 DIAGNOSIS — K5900 Constipation, unspecified: Secondary | ICD-10-CM | POA: Diagnosis not present

## 2022-12-22 DIAGNOSIS — E1151 Type 2 diabetes mellitus with diabetic peripheral angiopathy without gangrene: Secondary | ICD-10-CM

## 2022-12-22 DIAGNOSIS — E119 Type 2 diabetes mellitus without complications: Secondary | ICD-10-CM

## 2022-12-22 DIAGNOSIS — K921 Melena: Secondary | ICD-10-CM | POA: Diagnosis not present

## 2022-12-22 DIAGNOSIS — I509 Heart failure, unspecified: Secondary | ICD-10-CM

## 2022-12-22 HISTORY — PX: ESOPHAGOGASTRODUODENOSCOPY: SHX5428

## 2022-12-22 HISTORY — PX: BIOPSY: SHX5522

## 2022-12-22 LAB — CBC
HCT: 25.3 % — ABNORMAL LOW (ref 36.0–46.0)
Hemoglobin: 8 g/dL — ABNORMAL LOW (ref 12.0–15.0)
MCH: 28.4 pg (ref 26.0–34.0)
MCHC: 31.6 g/dL (ref 30.0–36.0)
MCV: 89.7 fL (ref 80.0–100.0)
Platelets: 242 10*3/uL (ref 150–400)
RBC: 2.82 MIL/uL — ABNORMAL LOW (ref 3.87–5.11)
RDW: 13.6 % (ref 11.5–15.5)
WBC: 5 10*3/uL (ref 4.0–10.5)
nRBC: 0 % (ref 0.0–0.2)

## 2022-12-22 LAB — IRON AND TIBC
Iron: 57 ug/dL (ref 28–170)
Saturation Ratios: 18 % (ref 10.4–31.8)
TIBC: 322 ug/dL (ref 250–450)
UIBC: 265 ug/dL

## 2022-12-22 LAB — OCCULT BLOOD, POC DEVICE: Fecal Occult Bld: NEGATIVE

## 2022-12-22 LAB — RETICULOCYTES
Immature Retic Fract: 15.2 % (ref 2.3–15.9)
RBC.: 3 MIL/uL — ABNORMAL LOW (ref 3.87–5.11)
Retic Count, Absolute: 43.8 10*3/uL (ref 19.0–186.0)
Retic Ct Pct: 1.5 % (ref 0.4–3.1)

## 2022-12-22 LAB — COMPREHENSIVE METABOLIC PANEL
ALT: 9 U/L (ref 0–44)
AST: 13 U/L — ABNORMAL LOW (ref 15–41)
Albumin: 2.6 g/dL — ABNORMAL LOW (ref 3.5–5.0)
Alkaline Phosphatase: 46 U/L (ref 38–126)
Anion gap: 10 (ref 5–15)
BUN: 44 mg/dL — ABNORMAL HIGH (ref 8–23)
CO2: 22 mmol/L (ref 22–32)
Calcium: 8.6 mg/dL — ABNORMAL LOW (ref 8.9–10.3)
Chloride: 111 mmol/L (ref 98–111)
Creatinine, Ser: 4.52 mg/dL — ABNORMAL HIGH (ref 0.44–1.00)
GFR, Estimated: 10 mL/min — ABNORMAL LOW (ref >60)
Glucose, Bld: 85 mg/dL (ref 70–99)
Potassium: 4.1 mmol/L (ref 3.5–5.1)
Sodium: 143 mmol/L (ref 135–145)
Total Bilirubin: 0.4 mg/dL (ref 0.3–1.2)
Total Protein: 5.4 g/dL — ABNORMAL LOW (ref 6.5–8.1)

## 2022-12-22 LAB — HEPATIC FUNCTION PANEL
ALT: 10 U/L (ref 0–44)
AST: 17 U/L (ref 15–41)
Albumin: 2.9 g/dL — ABNORMAL LOW (ref 3.5–5.0)
Alkaline Phosphatase: 50 U/L (ref 38–126)
Bilirubin, Direct: 0.2 mg/dL (ref 0.0–0.2)
Indirect Bilirubin: 0.6 mg/dL (ref 0.3–0.9)
Total Bilirubin: 0.8 mg/dL (ref 0.3–1.2)
Total Protein: 5.8 g/dL — ABNORMAL LOW (ref 6.5–8.1)

## 2022-12-22 LAB — URINALYSIS, COMPLETE (UACMP) WITH MICROSCOPIC
Bilirubin Urine: NEGATIVE
Glucose, UA: NEGATIVE mg/dL
Hgb urine dipstick: NEGATIVE
Ketones, ur: NEGATIVE mg/dL
Nitrite: NEGATIVE
Protein, ur: >=300 mg/dL — AB
Specific Gravity, Urine: 1.011 (ref 1.005–1.030)
pH: 6 (ref 5.0–8.0)

## 2022-12-22 LAB — MAGNESIUM
Magnesium: 1.9 mg/dL (ref 1.7–2.4)
Magnesium: 1.8 mg/dL (ref 1.7–2.4)

## 2022-12-22 LAB — CK: Total CK: 115 U/L (ref 38–234)

## 2022-12-22 LAB — TSH: TSH: 2.052 u[IU]/mL (ref 0.350–4.500)

## 2022-12-22 LAB — GLUCOSE, CAPILLARY
Glucose-Capillary: 79 mg/dL (ref 70–99)
Glucose-Capillary: 79 mg/dL (ref 70–99)

## 2022-12-22 LAB — PHOSPHORUS
Phosphorus: 5.1 mg/dL — ABNORMAL HIGH (ref 2.5–4.6)
Phosphorus: 4.9 mg/dL — ABNORMAL HIGH (ref 2.5–4.6)

## 2022-12-22 LAB — TYPE AND SCREEN
ABO/RH(D): O POS
Antibody Screen: NEGATIVE

## 2022-12-22 LAB — FERRITIN: Ferritin: 14 ng/mL (ref 11–307)

## 2022-12-22 LAB — FOLATE: Folate: 16.9 ng/mL (ref >5.9)

## 2022-12-22 LAB — VITAMIN B12: Vitamin B-12: 1019 pg/mL — ABNORMAL HIGH (ref 180–914)

## 2022-12-22 SURGERY — EGD (ESOPHAGOGASTRODUODENOSCOPY)
Anesthesia: Monitor Anesthesia Care

## 2022-12-22 MED ORDER — PROPOFOL 500 MG/50ML IV EMUL
INTRAVENOUS | Status: DC | PRN
  Administered 2022-12-22: 75 ug/kg/min via INTRAVENOUS

## 2022-12-22 MED ORDER — PANTOPRAZOLE SODIUM 20 MG PO TBEC: 20.0000 mg | DELAYED_RELEASE_TABLET | Freq: Two times a day (BID) | ORAL | 3 refills | Status: AC

## 2022-12-22 MED ORDER — RENA-VITE PO TABS: 1.0000 | ORAL_TABLET | Freq: Every day | ORAL | Status: DC

## 2022-12-22 MED ORDER — PROPOFOL 10 MG/ML IV BOLUS
INTRAVENOUS | Status: DC | PRN
  Administered 2022-12-22 (×2): 50 mg via INTRAVENOUS

## 2022-12-22 MED ORDER — SODIUM CHLORIDE 0.9 % IV SOLN: INTRAVENOUS | Status: DC

## 2022-12-22 NOTE — TOC Transition Note (Addendum)
Transition of Care [facility]) - CM/SW Discharge Note   Patient Details  Name: Teresa Poole MRN: [id number] Date of Birth: 1950/12/05  Transition of Care [facility]) CM/SW Contact:  Tom-Johnson, Hershal Coria, RN Phone Number: 12/22/2022, 2:41 PM   Clinical Narrative:     Patient is scheduled for discharge today.  Readmission Prevention Assessment done.  Hospital f/u and discharge instructions on AVS.  No PT f/u noted.  Sister, [name] to transport at discharge.  No further TOC needs noted.          Final next level of care: Home/Self Care Barriers to Discharge: Barriers Resolved   Patient Goals and CMS Choice CMS Medicare.gov Compare Post Acute Care list provided to:: Patient Choice offered to / list presented to : NA  Discharge Placement                  Patient to be transferred to facility by: Family      Discharge Plan and Services Additional resources added to the After Visit Summary for                  DME Arranged: N/A DME Agency: NA       HH Arranged: NA HH Agency: NA        Social Determinants of Health (SDOH) Interventions SDOH Screenings   Food Insecurity: No Food Insecurity (11/08/2022)  Housing: Low Risk  (11/08/2022)  Transportation Needs: No Transportation Needs (11/08/2022)  Utilities: Not At Risk (11/08/2022)  Tobacco Use: Low Risk  (12/22/2022)     Readmission Risk Interventions    12/22/2022    2:40 PM 10/28/2021    2:56 PM  Readmission Risk Prevention Plan  Transportation Screening Complete Complete  Medication Review (RN Care Manager) Referral to Pharmacy Complete  PCP or Specialist appointment within 3-5 days of discharge Complete Complete  HRI or Home Care Consult Complete Complete  SW Recovery Care/Counseling Consult Complete Complete  Palliative Care Screening Not Applicable Not Applicable  Skilled Nursing Facility Not Applicable Not Applicable

## 2022-12-22 NOTE — Discharge Summary (Signed)
Physician Discharge Summary  Teresa Poole:[id number] DOB: 1951/06/28 DOA: 12/21/2022  PCP: Malka So., MD  Admit date: 12/21/2022 Discharge date: 12/22/2022  Admitted From: Home Disposition: Home  Recommendations for Outpatient Follow-up:  Follow up with PCP in 1-2 weeks Please obtain BMP/CBC in one week   Home Health: N/A Equipment/Devices: N/A  Discharge Condition: Stable CODE STATUS: Full code Diet recommendation: Low-salt diet  Discharge summary: 72 year old with history of type 2 diabetes, chronic congestive heart failure with known ejection fraction 45%, CKD stage IV, untreated obstructive sleep apnea, hypertension, hyperlipidemia presented with unintentional overdose.  Apparently patient was at [facility], she takes more than 10 medications and she took all of them at once.  She was asymptomatic on arrival as per ER doctor's note but wanted to make sure she got checked out. In the emergency room asymptomatic.  Apparently 2 weeks ago her hemoglobin was 9.  Yesterday her hemoglobin was 8.1.  She does have a history of anemia of chronic disease.  Did not report any frank evidence of bleeding, occult blood test was liquidy and peach black.  Patient did report dark black stool for last 1 to 2 weeks.  Admitted with GI consultation, suspected GI bleeding.  # Acute on chronic anemia of chronic disease, melena: Hemoglobin has remained stable.  Her hemoglobin always ranges between 8-9.  Repeat hemoglobin has remained stable.  Currently no evidence of active ongoing bleeding.  Patient currently not needing any blood transfusions. Due to significant findings, patient underwent upper GI endoscopy only 2 found some evidence of Barrett's esophagus.  No active bleeding. Patient is on Protonix 20 mg daily at home, will change to Protonix 20 mg twice daily.   Diabetes type 2 with chronic kidney disease stage IV: Patient not on any treatment at home for diabetes.  Diet  controlled. Kidney functions at about baseline.  Monitor.   Chronic systolic heart failure: Recent ejection fraction 45%.  Euvolemic.    Essential hypertension: Blood pressure stable.  Resume all home medications.  Adjustment disorder with anxiety: Patient uses Seroquel at home.  Continued.  Patient came back from procedure.  She insist on going home.  Called and discussed with patient's sister who is going to pick her up and also help her at home.     Discharge Diagnoses:  Principal Problem:   Melena Active Problems:   Hypertension associated with diabetes (HCC)   Chronic systolic heart failure (HCC)   Type 2 diabetes mellitus with chronic kidney disease, without long-term current use of insulin (HCC)   Hyperlipidemia associated with type 2 diabetes mellitus (HCC)   Anemia   CKD (chronic kidney disease), stage IV (HCC)   QT prolongation   Acute on chronic anemia    Discharge Instructions  Discharge Instructions     Diet - low sodium heart healthy   Complete by: As directed    Increase activity slowly   Complete by: As directed       Allergies as of 12/22/2022       Reactions   Hydralazine Hcl Itching   ENTIRE BODY = burning sensation, also in the eyes (they have become very sensitive to light)   Atorvastatin Other (See Comments)   Per MD - pt not sure of reaction         Medication List     STOP taking these medications    famotidine 40 MG tablet Commonly known as: PEPCID       TAKE these medications  amLODipine 10 MG tablet Commonly known as: NORVASC Take 1 tablet (10 mg total) by mouth daily.   aspirin EC 81 MG tablet Take 1 tablet (81 mg total) by mouth daily. Swallow whole.   azelastine 0.1 % nasal spray Commonly known as: ASTELIN Place 2 sprays into both nostrils 2 (two) times daily.   carvedilol 25 MG tablet Commonly known as: COREG Take 1 tablet (25 mg total) by mouth 2 (two) times daily with a meal.   cetirizine 10 MG  tablet Commonly known as: ZYRTEC Take 10 mg by mouth daily as needed for allergies.   cyanocobalamin 1000 MCG tablet Take 1 tablet (1,000 mcg total) by mouth daily.   docusate sodium 100 MG capsule Commonly known as: COLACE Take 1 capsule (100 mg total) by mouth 2 (two) times daily. What changed:  when to take this reasons to take this   fluticasone 50 MCG/ACT nasal spray Commonly known as: FLONASE Place 2 sprays into both nostrils daily.   glucose blood test strip Commonly known as: ONE TOUCH ULTRA TEST Use to check blood sugar 3 times daily Dx code E11.65 What changed:  how much to take how to take this when to take this   iron polysaccharides 150 MG capsule Commonly known as: NIFEREX Take 1 capsule (150 mg total) by mouth daily.   isosorbide mononitrate 60 MG 24 hr tablet Commonly known as: IMDUR Take 1 tablet (60 mg total) by mouth daily.   loratadine 10 MG tablet Commonly known as: Claritin Take 1 tablet (10 mg total) by mouth daily.   pantoprazole 20 MG tablet Commonly known as: PROTONIX Take 1 tablet (20 mg total) by mouth 2 (two) times daily. What changed: when to take this   QUEtiapine 50 MG tablet Commonly known as: SEROQUEL Take 1 tablet (50 mg total) by mouth at bedtime.   QUEtiapine 25 MG tablet Commonly known as: SEROQUEL Take 1 tablet (25 mg total) by mouth 2 (two) times daily as needed (Agitation).   rosuvastatin 40 MG tablet Commonly known as: CRESTOR Take 1 tablet (40 mg total) by mouth daily.   thiamine 100 MG tablet Commonly known as: Vitamin B-1 Take 1 tablet (100 mg total) by mouth daily.        Allergies  Allergen Reactions   Hydralazine Hcl Itching    ENTIRE BODY = burning sensation, also in the eyes (they have become very sensitive to light)   Atorvastatin Other (See Comments)    Per MD - pt not sure of reaction     Consultations: Gastroenterology   Procedures/Studies: DG Chest 2 View  Result Date:  12/07/2022 CLINICAL DATA:  chest pressure EXAM: CHEST - 2 VIEW COMPARISON:  12/04/2022 FINDINGS: Cardiac silhouette is unremarkable. No pneumothorax or pleural effusion. The lungs are clear. Aorta is calcified. There are thoracic degenerative changes. IMPRESSION: No acute cardiopulmonary process. Electronically Signed   By: Layla Maw M.D.   On: 12/07/2022 07:41   DG Chest 2 View  Result Date: 12/04/2022 CLINICAL DATA:  Congestion. EXAM: CHEST - 2 VIEW COMPARISON:  November 29, 2022. FINDINGS: The heart size and mediastinal contours are within normal limits. Both lungs are clear. The visualized skeletal structures are unremarkable. IMPRESSION: No active cardiopulmonary disease. Aortic Atherosclerosis (ICD10-I70.0). Electronically Signed   By: Lupita Raider M.D.   On: 12/04/2022 13:48   DG Chest Port 1 View  Result Date: 11/29/2022 CLINICAL DATA:  Chest pain EXAM: PORTABLE CHEST 1 VIEW COMPARISON:  Chest x-ray dated November 15, 2022 FINDINGS: The heart size and mediastinal contours are within normal limits. Both lungs are clear. The visualized skeletal structures are unremarkable. IMPRESSION: No active disease. Electronically Signed   By: Allegra Lai M.D.   On: 11/29/2022 15:41   (Echo, Carotid, EGD, Colonoscopy, ERCP)    Subjective: Patient seen in the morning rounds.  She tells me she always have this acid problem and nobody can fix it.  Needs to go home.  Her family is going on vacation so she needs to go home.  Agreed for procedure and EGD was fairly normal with some evidence of Barrett's esophagus. Since patient wants to go home, I called and discussed with her sister who does help her around.   Discharge Exam: Vitals:   12/22/22 1320 12/22/22 1339  BP: (!) 147/80   Pulse: 69   Resp: (!) 27 19  Temp:    SpO2: 100%    Vitals:   12/22/22 1300 12/22/22 1310 12/22/22 1320 12/22/22 1339  BP: (!) 131/57 (!) 142/62 (!) 147/80   Pulse: 71 68 69   Resp: 17 17 (!) 27 19  Temp:       TempSrc:      SpO2: 99% 99% 100%   Weight:      Height:        General: Pt is alert, awake, not in acute distress Alert awake and oriented.  She is slightly impulsive however understands her illness. Cardiovascular: RRR, S1/S2 +, no rubs, no gallops Respiratory: CTA bilaterally, no wheezing, no rhonchi Abdominal: Soft, NT, ND, bowel sounds + Extremities: no edema, no cyanosis    The results of significant diagnostics from this hospitalization (including imaging, microbiology, ancillary and laboratory) are listed below for reference.     Microbiology: No results found for this or any previous visit (from the past 240 hour(s)).   Labs: BNP (last 3 results) Recent Labs    03/07/22 1215 08/30/22 1208  BNP 27.6 58.5   Basic Metabolic Panel: Recent Labs  Lab 12/21/22 2120 12/22/22 0000 12/22/22 0715  NA 140  --  143  K 4.3  --  4.1  CL 109  --  111  CO2 22  --  22  GLUCOSE 139*  --  85  BUN 49*  --  44*  CREATININE 4.47*  --  4.52*  CALCIUM 8.6*  --  8.6*  MG  --  1.9 1.8  PHOS  --  5.1* 4.9*   Liver Function Tests: Recent Labs  Lab 12/22/22 0000 12/22/22 0715  AST 17 13*  ALT 10 9  ALKPHOS 50 46  BILITOT 0.8 0.4  PROT 5.8* 5.4*  ALBUMIN 2.9* 2.6*   No results for input(s): "LIPASE", "AMYLASE" in the last 168 hours. No results for input(s): "AMMONIA" in the last 168 hours. CBC: Recent Labs  Lab 12/21/22 2120 12/22/22 0715  WBC 5.9 5.0  NEUTROABS 3.8  --   HGB 8.1* 8.0*  HCT 26.4* 25.3*  MCV 91.7 89.7  PLT 254 242   Cardiac Enzymes: Recent Labs  Lab 12/22/22 0000  CKTOTAL 115   BNP: Invalid input(s): "POCBNP" CBG: Recent Labs  Lab 12/21/22 2354 12/22/22 0324 12/22/22 0718  GLUCAP 130* 79 79   D-Dimer No results for input(s): "DDIMER" in the last 72 hours. Hgb A1c No results for input(s): "HGBA1C" in the last 72 hours. Lipid Profile No results for input(s): "CHOL", "HDL", "LDLCALC", "TRIG", "CHOLHDL", "LDLDIRECT" in the last 72  hours. Thyroid function studies Recent Labs  12/22/22 0000  TSH 2.052   Anemia work up Recent Labs    12/22/22 0000  VITAMINB12 1,019*  FOLATE 16.9  FERRITIN 14  TIBC 322  IRON 57  RETICCTPCT 1.5   Urinalysis    Component Value Date/Time   COLORURINE YELLOW 12/22/2022 0028   APPEARANCEUR CLEAR 12/22/2022 0028   LABSPEC 1.011 12/22/2022 0028   PHURINE 6.0 12/22/2022 0028   GLUCOSEU NEGATIVE 12/22/2022 0028   HGBUR NEGATIVE 12/22/2022 0028   BILIRUBINUR NEGATIVE 12/22/2022 0028   KETONESUR NEGATIVE 12/22/2022 0028   PROTEINUR >=300 (A) 12/22/2022 0028   UROBILINOGEN 0.2 09/22/2013 1451   NITRITE NEGATIVE 12/22/2022 0028   LEUKOCYTESUR TRACE (A) 12/22/2022 0028   Sepsis Labs Recent Labs  Lab 12/21/22 2120 12/22/22 0715  WBC 5.9 5.0   Microbiology No results found for this or any previous visit (from the past 240 hour(s)).   Time coordinating discharge:  32 minutes  SIGNED:   Dorcas Carrow, MD  [facility] 12/22/2022, 1:57 PM

## 2022-12-22 NOTE — Progress Notes (Signed)
Teresa Poole to be discharged Home per MD order. Discussed prescriptions and follow up appointments with the patient. Prescriptions and  medication list explained in detail. Patient verbalized understanding.  Skin clean, dry and intact without evidence of skin break down, no evidence of skin tears noted. IV catheter discontinued intact. Site without signs and symptoms of complications. Dressing and pressure applied. Pt denies pain at the site currently. No complaints noted.  Patient free of lines, drains, and wounds.   An After Visit Summary (AVS) was printed and given to the patient. Patient escorted via wheelchair, and discharged home via private auto.  Arvilla Meres, RN

## 2022-12-22 NOTE — Op Note (Signed)
[facility] Patient Name: Teresa Poole Procedure Date : 12/22/2022 MRN: [id number] Attending MD: Doristine Locks , MD, [id number] Date of Birth: [birth year] CSN: [id number] Age: 72 Admit Type: Inpatient Procedure:                Upper GI endoscopy Indications:              Acute on chronic anemia with H/H 8/25 from baseline                            Hgb 9-10. Also reports darks stools x1-2 weeks.                            Normal iron studies, B12, folate. Providers:                Doristine Locks, MD, Dartha Lodge, RN, Rozetta Nunnery, Technician Referring MD:              Medicines:                Monitored Anesthesia Care Complications:            No immediate complications. Estimated Blood Loss:     Estimated blood loss was minimal. Procedure:                Pre-Anesthesia Assessment:                           - Prior to the procedure, a History and Physical                            was performed, and patient medications and                            allergies were reviewed. The patient's tolerance of                            previous anesthesia was also reviewed. The risks                            and benefits of the procedure and the sedation                            options and risks were discussed with the patient.                            All questions were answered, and informed consent                            was obtained. Prior Anticoagulants: The patient has                            taken no anticoagulant or antiplatelet agents  except for aspirin. ASA Grade Assessment: III - A                            patient with severe systemic disease. After                            reviewing the risks and benefits, the patient was                            deemed in satisfactory condition to undergo the                            procedure.                           After obtaining informed  consent, the endoscope was                            passed under direct vision. Throughout the                            procedure, the patient's blood pressure, pulse, and                            oxygen saturations were monitored continuously. The                            [id number]-[id number] ([id number]) Olympus endoscope was introduced                            through the mouth, and advanced to the second part                            of duodenum. The upper GI endoscopy was                            accomplished without difficulty. The patient                            tolerated the procedure well. Scope In: Scope Out: Findings:      The upper third of the esophagus and middle third of the esophagus were       normal.      Circumferential salmon-colored mucosa was present from 34 to 35 cm. The       maximum longitudinal extent of these esophageal mucosal changes was 1 cm       in length. Biopsies were taken with a cold forceps for histology.       Estimated blood loss was minimal.      The entire examined stomach was normal.      The examined duodenum was normal. Impression:               - Normal upper third of esophagus and middle third  of esophagus.                           - Salmon-colored mucosa suspicious for                            short-segment Barrett's esophagus. Biopsied.                           - Normal stomach.                           - Normal examined duodenum. Recommendation:           - Return patient to hospital ward for ongoing care.                           - Advance diet as tolerated.                           - Continue present medications.                           - Await pathology results. Procedure Code(s):        --- Professional ---                           [phone], Esophagogastroduodenoscopy, flexible,                            transoral; with biopsy, single or multiple Diagnosis Code(s):        --- Professional ---                            K22.89, Other specified disease of esophagus                           D62, Acute posthemorrhagic anemia                           K92.1, Melena (includes Hematochezia) CPT copyright 2022 [employer]. All rights reserved. The codes documented in this report are preliminary and upon coder review may  be revised to meet current compliance requirements. Doristine Locks, MD 12/22/2022 12:57:41 PM Number of Addenda: 0

## 2022-12-22 NOTE — H&P (View-Only) (Signed)
Attending physician's note   I have taken a history, reviewed the chart, and examined the patient. I performed a substantive portion of this encounter, including complete performance of at least one of the key components, in conjunction with the APP. I agree with the APP's note, impression, and recommendations with my edits.   Plan for expedited EGD today for diagnostic and therapeutic intent.  [address], DO, [place] [phone] office           Consultation  Referring Provider:  [facility]  Primary Care Physician:  Malka So., MD Primary Gastroenterologist:  Bonnita Nasuti     Reason for Consultation:  GI bleed     LOS: 1 day          HPI:   Teresa Poole is a 72 y.o. female with past medical history significant for HFpEF, CKD stage IV, carotid stenosis, type 2 diabetes, history of ovarian cancer, presents for evaluation of GI bleed  Patient originally presented to ED because she was concerned that she took both her a.m. and p.m. medicines at the same time.  She takes about 12 to 16 pills/day and takes them randomly throughout the day, she is unsure when to take them or what they are. Wanted to be checked out.  Primary GI gap in [place], last seen 08/2021 for GERD.  Was recommended to take PPI but patient was noncompliant.  They recommended endoscopy but that was not scheduled.  Patient states she has been having burning epigastric pain ongoing for over a year. Takes Maalox with minimal relief. States it happens all the time, but worse with eating. Denies weight loss. Denies nausea/vomiting. States she has had formed black stools for at least one month, maybe up to 6 months. Reports history of iron supplementation, though states she has never been told she has a low blood count/iron count before. Last hgb 10.0 one month ago, upon admission hgb 8.0. unsure of family history of colon cancer. Reports history of constipation, seldom has soft stools. Typically has  infrequent stools with straining. States she had a colonoscopy 30+ years ago, unsure why or what it showed.  Denies NSAIDs, tobacco, alcohol products. Takes daily ASA per medication reports, thinks she takes this daily with her usual meds.  Past Medical History:  Diagnosis Date   Allergy    Anemia    Carotid stenosis    Chronic combined systolic and diastolic CHF (congestive heart failure) (HCC)    CKD (chronic kidney disease), stage IV (HCC) 09/24/2013   Pt at [facility], Dr. Marisue Humble   COVID-19 10/24/2021   Diabetes mellitus type 2 in nonobese [facility])    Edema 09/14/2013   GERD (gastroesophageal reflux disease)    History of ovarian cancer    Hyperlipidemia    Hypertension    Mild CAD    non-obstructive by LHC (09/25/2013): Proximal and mid LAD serial 20%, proximal circumflex 30%, mid AV groove circumflex 30%, mid RCA mild plaque.   NICM (nonischemic cardiomyopathy) (HCC)    Obesity (BMI 30-39.9)    RBBB    Renal artery stenosis (HCC) 10/11/2018   Thyroid disease    Seen by specialist    Surgical History:  She  has a past surgical history that includes left heart catheterization with coronary angiogram (N/A, 09/25/2013) and RENAL ANGIOGRAPHY (N/A, 10/06/2018). Family History:  Her family history includes Diabetes in her father; Hypertension in her father and maternal grandfather. Social History:   reports that she has never  smoked. She has never used smokeless tobacco. She reports that she does not drink alcohol and does not use drugs.  Prior to Admission medications   Medication Sig Start Date End Date Taking? Authorizing Provider  amLODipine (NORVASC) 10 MG tablet Take 1 tablet (10 mg total) by mouth daily. 11/14/22  Yes Regalado, Belkys A, MD  aspirin EC 81 MG tablet Take 1 tablet (81 mg total) by mouth daily. Swallow whole. 11/14/22  Yes Regalado, Belkys A, MD  azelastine (ASTELIN) 0.1 % nasal spray Place 2 sprays into both nostrils 2 (two) times daily. 12/02/22  Yes [provider]  carvedilol (COREG) 25 MG tablet Take 1 tablet (25 mg total) by mouth 2 (two) times daily with a meal. 11/13/22  Yes Regalado, Belkys A, MD  cetirizine (ZYRTEC) 10 MG tablet Take 10 mg by mouth daily as needed for allergies. 11/19/22  Yes [provider]  cyanocobalamin 1000 MCG tablet Take 1 tablet (1,000 mcg total) by mouth daily. 11/14/22  Yes Regalado, Belkys A, MD  docusate sodium (COLACE) 100 MG capsule Take 1 capsule (100 mg total) by mouth 2 (two) times daily. Patient taking differently: Take 100 mg by mouth 2 (two) times daily as needed for mild constipation. 11/13/22  Yes Regalado, Belkys A, MD  famotidine (PEPCID) 40 MG tablet Take 40 mg by mouth daily. 09/04/21  Yes [provider]  isosorbide mononitrate (IMDUR) 60 MG 24 hr tablet Take 1 tablet (60 mg total) by mouth daily. 12/14/22  Yes Conte, Tessa N, PA-C  loratadine (CLARITIN) 10 MG tablet Take 1 tablet (10 mg total) by mouth daily. 12/04/22 01/03/23 Yes Curatolo, Adam, DO  pantoprazole (PROTONIX) 20 MG tablet Take 1 tablet (20 mg total) by mouth daily. 11/29/22 03/29/23 Yes Schutt, Edsel Petrin, PA-C  QUEtiapine (SEROQUEL) 25 MG tablet Take 1 tablet (25 mg total) by mouth 2 (two) times daily as needed (Agitation). 11/13/22  Yes Regalado, Belkys A, MD  QUEtiapine (SEROQUEL) 50 MG tablet Take 1 tablet (50 mg total) by mouth at bedtime. 11/13/22  Yes Regalado, Belkys A, MD  rosuvastatin (CRESTOR) 40 MG tablet Take 1 tablet (40 mg total) by mouth daily. 12/17/22  Yes Conte, Tessa N, PA-C  thiamine (VITAMIN B-1) 100 MG tablet Take 1 tablet (100 mg total) by mouth daily. 11/14/22  Yes Regalado, Belkys A, MD  fluticasone (FLONASE) 50 MCG/ACT nasal spray Place 2 sprays into both nostrils daily. 11/19/22   [provider]  glucose blood (ONE TOUCH ULTRA TEST) test strip Use to check blood sugar 3 times daily Dx code E11.65 Patient taking differently: 1 each by Other route See admin instructions. Use to check blood  sugar 3 times daily Dx code E11.65 11/20/14   Reather Littler, MD  iron polysaccharides (NIFEREX) 150 MG capsule Take 1 capsule (150 mg total) by mouth daily. Patient not taking: Reported on 12/22/2022 11/14/22   Alba Cory, MD    Current Facility-Administered Medications  Medication Dose Route Frequency Provider Last Rate Last Admin   0.9 %  sodium chloride infusion  250 mL Intravenous PRN Therisa Doyne, MD       acetaminophen (TYLENOL) tablet 650 mg  650 mg Oral Q6H PRN Doutova, Anastassia, MD       Or   acetaminophen (TYLENOL) suppository 650 mg  650 mg Rectal Q6H PRN Doutova, Anastassia, MD       albuterol (PROVENTIL) (2.5 MG/3ML) 0.083% nebulizer solution 2.5 mg  2.5 mg Nebulization Q2H PRN Therisa Doyne, MD  guaiFENesin (MUCINEX) 12 hr tablet 600 mg  600 mg Oral BID Doutova, Jonny Ruiz, MD       HYDROcodone-acetaminophen (NORCO/VICODIN) 5-325 MG per tablet 1-2 tablet  1-2 tablet Oral Q4H PRN Doutova, Anastassia, MD       insulin aspart (novoLOG) injection 0-6 Units  0-6 Units Subcutaneous Q4H Doutova, Anastassia, MD       labetalol (NORMODYNE) injection 10 mg  10 mg Intravenous Q2H PRN Doutova, Anastassia, MD       loratadine (CLARITIN) tablet 10 mg  10 mg Oral Daily Doutova, Anastassia, MD       ondansetron (ZOFRAN) tablet 4 mg  4 mg Oral Q6H PRN Doutova, Anastassia, MD       Or   ondansetron (ZOFRAN) injection 4 mg  4 mg Intravenous Q6H PRN Doutova, Anastassia, MD       pantoprazole (PROTONIX) injection 40 mg  40 mg Intravenous Q12H Doutova, Anastassia, MD   40 mg at 12/22/22 0004   rosuvastatin (CRESTOR) tablet 40 mg  40 mg Oral Daily Doutova, Anastassia, MD       sodium chloride flush (NS) 0.9 % injection 3 mL  3 mL Intravenous Q12H Doutova, Anastassia, MD   3 mL at 12/22/22 0005   sodium chloride flush (NS) 0.9 % injection 3 mL  3 mL Intravenous PRN Therisa Doyne, MD        Allergies as of 12/21/2022 - Review Complete 12/21/2022  Allergen Reaction Noted    Hydralazine hcl Itching 10/04/2018   Atorvastatin Other (See Comments) 11/27/2018    Review of Systems  Constitutional:  Negative for chills, fever and weight loss.  HENT:  Negative for hearing loss and tinnitus.   Eyes:  Negative for blurred vision and double vision.  Respiratory:  Negative for cough and shortness of breath.   Cardiovascular:  Negative for chest pain and palpitations.  Gastrointestinal:  Positive for abdominal pain, constipation and melena. Negative for blood in stool, diarrhea, heartburn, nausea and vomiting.  Genitourinary:  Negative for dysuria and urgency.  Musculoskeletal:  Negative for myalgias and neck pain.  Skin:  Negative for itching and rash.  Neurological:  Negative for seizures.  Psychiatric/Behavioral:  Negative for depression and suicidal ideas.        Physical Exam:  Vital signs in last 24 hours: Temp:  [97.7 F (36.5 C)-98.6 F (37 C)] 97.7 F (36.5 C) (04/30 0834) Pulse Rate:  [62-76] 74 (04/30 0834) Resp:  [15-20] 17 (04/30 0834) BP: (130-185)/(55-76) 185/72 (04/30 0834) SpO2:  [94 %-100 %] 99 % (04/30 0834) Weight:  [68 kg-72.2 kg] 72.2 kg (04/30 0315) Last BM Date : 12/21/22 Last BM recorded by nurses in past 5 days No data recorded  Physical Exam Constitutional:      Appearance: Normal appearance.  HENT:     Head: Normocephalic and atraumatic.     Nose: Nose normal. No congestion.     Mouth/Throat:     Mouth: Mucous membranes are moist.     Pharynx: Oropharynx is clear.  Eyes:     Extraocular Movements: Extraocular movements intact.     Comments: Conjunctival pallor  Cardiovascular:     Rate and Rhythm: Normal rate and regular rhythm.  Pulmonary:     Effort: Pulmonary effort is normal. No respiratory distress.  Abdominal:     General: Abdomen is flat. Bowel sounds are normal. There is no distension.     Palpations: Abdomen is soft. There is no mass.     Tenderness: There is no  abdominal tenderness. There is no guarding or  rebound.     Hernia: No hernia is present.  Musculoskeletal:        General: No swelling. Normal range of motion.     Cervical back: Normal range of motion and neck supple.  Skin:    General: Skin is warm and dry.     Coloration: Skin is not jaundiced.  Neurological:     General: No focal deficit present.     Mental Status: She is alert and oriented to person, place, and time.  Psychiatric:        Mood and Affect: Mood normal.        Behavior: Behavior normal.        Thought Content: Thought content normal.        Judgment: Judgment normal.      LAB RESULTS: Recent Labs    12/21/22 2120 12/22/22 0715  WBC 5.9 5.0  HGB 8.1* 8.0*  HCT 26.4* 25.3*  PLT 254 242   BMET Recent Labs    12/21/22 2120 12/22/22 0715  NA 140 143  K 4.3 4.1  CL 109 111  CO2 22 22  GLUCOSE 139* 85  BUN 49* 44*  CREATININE 4.47* 4.52*  CALCIUM 8.6* 8.6*   LFT Recent Labs    12/22/22 0000 12/22/22 0715  PROT 5.8* 5.4*  ALBUMIN 2.9* 2.6*  AST 17 13*  ALT 10 9  ALKPHOS 50 46  BILITOT 0.8 0.4  BILIDIR 0.2  --   IBILI 0.6  --    PT/INR No results for input(s): "LABPROT", "INR" in the last 72 hours.  STUDIES: No results found.    Impression    Melena, anemia -Hgb 8.0 (10.0 27-month ago) -BUN 44, creatinine 4.52 - iron 57, ferritin 14 History of burning epigastric pain and reflux noncompliant with PPI. Unsure if melena is true GI bleed with history of iron supplementation and relatively stable hgb, though cannot rule out. EGD for gastritis, esophagitis, PUD would be useful. Anemia likely multifactorial with okay iron studies and history of CKD  Constipation  CAD - on ASA  DM2  CKD stage IV -BUN 44, creatinine 4.52 -GFR 10   Plan   Plan for EGD today. I thoroughly discussed the procedures to include nature, alternatives, benefits, and risks including but not limited to bleeding, perforation, infection, anesthesia/cardiac and pulmonary complications. Patient provides  understanding and gave verbal consent to proceed. Continue Protonix IV 40mg  BID NPO  Miralax 17g BID If she does not undergo colonoscopy during this admission, would recommend colonoscopy as outpatient with primary GI for constipation and screening for colon cancer  Thank you for your kind consultation, we will continue to follow.   Bayley Leanna Sato  12/22/2022, 8:38 AM

## 2022-12-22 NOTE — Interval H&P Note (Signed)
History and Physical Interval Note:  12/22/2022 12:37 PM  Teresa Poole  has presented today for surgery, with the diagnosis of anemia, melena, epigastric pain.  The various methods of treatment have been discussed with the patient and family. After consideration of risks, benefits and other options for treatment, the patient has consented to  Procedure(s): ESOPHAGOGASTRODUODENOSCOPY (EGD) (N/A) as a surgical intervention.  The patient's history has been reviewed, patient examined, no change in status, stable for surgery.  I have reviewed the patient's chart and labs.  Questions were answered to the patient's satisfaction.     Teresa Poole

## 2022-12-22 NOTE — Progress Notes (Signed)
PT Cancellation Note  Patient Details Name: Teresa Poole MRN: [id number] DOB: 1951-07-26   Cancelled Treatment:    Reason Eval/Treat Not Completed: Patient at procedure or test/unavailable. Pt currently off unit with endoscopy. Will check back as schedule allows to initiate PT evaluation.    Marylynn Pearson 12/22/2022, 1:10 PM  Conni Slipper, PT, DPT Acute Rehabilitation Services Secure Chat Preferred Office: [phone]

## 2022-12-22 NOTE — Plan of Care (Signed)
  Problem: Education: Goal: Knowledge of General Education information will improve Description Including pain rating scale, medication(s)/side effects and non-pharmacologic comfort measures Outcome: Progressing   

## 2022-12-22 NOTE — Progress Notes (Signed)
OT Cancellation Note  Patient Details Name: SAHITI JOSWICK MRN: [id number] DOB: 03-11-1951   Cancelled Treatment:    Reason Eval/Treat Not Completed: OT screened, no needs identified, will sign off (Pt dressed and observed independently ambulating with PT, no acute needs identified. Pt with active d/c plans today. Will sign off, thank you.)  Donia Pounds 12/22/2022, 2:38 PM

## 2022-12-22 NOTE — Progress Notes (Signed)
OT Cancellation Note  Patient Details Name: Teresa Poole MRN: [id number] DOB: 09-13-1950   Cancelled Treatment:    Reason Eval/Treat Not Completed: Patient at procedure or test/ unavailable Off unit for EGD.  Lorre Munroe 12/22/2022, 11:43 AM

## 2022-12-22 NOTE — Care Management Obs Status (Signed)
MEDICARE OBSERVATION STATUS NOTIFICATION   Patient Details  Name: Teresa Poole MRN: [id number] Date of Birth: 25-Jan-1951   Medicare Observation Status Notification Given:  Yes    Tom-Johnson, Hershal Coria, RN 12/22/2022, 2:46 PM

## 2022-12-22 NOTE — Transfer of Care (Signed)
Immediate Anesthesia Transfer of Care Note  Patient: Teresa Poole  Procedure(s) Performed: ESOPHAGOGASTRODUODENOSCOPY (EGD) BIOPSY  Patient Location: Endoscopy Unit  Anesthesia Type:MAC  Level of Consciousness: drowsy and patient cooperative  Airway & Oxygen Therapy: Patient Spontanous Breathing  Post-op Assessment: Report given to RN, Post -op Vital signs reviewed and stable, and Patient moving all extremities X 4  Post vital signs: Reviewed and stable  Last Vitals:  Vitals Value Taken Time  BP 116/53 12/22/22 1252  Temp    Pulse 72 12/22/22 1253  Resp 20 12/22/22 1253  SpO2 98 % 12/22/22 1253  Vitals shown include unvalidated device data.  Last Pain:  Vitals:   12/22/22 1122  TempSrc: Temporal  PainSc: 0-No pain         Complications: No notable events documented.

## 2022-12-22 NOTE — Anesthesia Preprocedure Evaluation (Signed)
Anesthesia Evaluation  Patient identified by MRN, date of birth, ID band Patient awake    Reviewed: Allergy & Precautions, H&P , NPO status , Patient's Chart, lab work & pertinent test results  Airway Mallampati: II  TM Distance: >3 FB Neck ROM: Full    Dental no notable dental hx.    Pulmonary neg pulmonary ROS   Pulmonary exam normal breath sounds clear to auscultation       Cardiovascular hypertension, Pt. on medications + CAD, + Peripheral Vascular Disease and +CHF  Normal cardiovascular exam+ dysrhythmias  Rhythm:Regular Rate:Normal     Neuro/Psych       Dementia negative neurological ROS  negative psych ROS   GI/Hepatic Neg liver ROS,GERD  ,,  Endo/Other  negative endocrine ROSdiabetes, Type 2    Renal/GU Renal InsufficiencyRenal disease  negative genitourinary   Musculoskeletal negative musculoskeletal ROS (+)    Abdominal   Peds negative pediatric ROS (+)  Hematology  (+) Blood dyscrasia, anemia   Anesthesia Other Findings   Reproductive/Obstetrics negative OB ROS                             Anesthesia Physical Anesthesia Plan  ASA: 3  Anesthesia Plan: MAC   Post-op Pain Management: Minimal or no pain anticipated   Induction: Intravenous  PONV Risk Score and Plan: 2 and Treatment may vary due to age or medical condition and Propofol infusion  Airway Management Planned: Nasal Cannula  Additional Equipment:   Intra-op Plan:   Post-operative Plan:   Informed Consent: I have reviewed the patients History and Physical, chart, labs and discussed the procedure including the risks, benefits and alternatives for the proposed anesthesia with the patient or authorized representative who has indicated his/her understanding and acceptance.     Dental advisory given  Plan Discussed with: CRNA  Anesthesia Plan Comments:        Anesthesia Quick Evaluation

## 2022-12-22 NOTE — Consult Note (Addendum)
   Attending physician's note   I have taken a history, reviewed the chart, and examined the patient. I performed a substantive portion of this encounter, including complete performance of at least one of the key components, in conjunction with the APP. I agree with the APP's note, impression, and recommendations with my edits.   Plan for expedited EGD today for diagnostic and therapeutic intent.  [name], DO, FACG [phone] office           Consultation  Referring Provider:  TR[facility]  Primary Care Physician:  Jobe, Daniel B., MD Primary Gastroenterologist:  [facility] [place]     Reason for Consultation:  GI bleed     LOS: 1 day          HPI:   Teresa Poole is a 72 y.o. female with past medical history significant for HFpEF, CKD stage IV, carotid stenosis, type 2 diabetes, history of ovarian cancer, presents for evaluation of GI bleed  Patient originally presented to ED because she was concerned that she took both her a.m. and p.m. medicines at the same time.  She takes about 12 to 16 pills/day and takes them randomly throughout the day, she is unsure when to take them or what they are. Wanted to be checked out.  Primary GI gap in [place], last seen 08/2021 for GERD.  Was recommended to take PPI but patient was noncompliant.  They recommended endoscopy but that was not scheduled.  Patient states she has been having burning epigastric pain ongoing for over a year. Takes Maalox with minimal relief. States it happens all the time, but worse with eating. Denies weight loss. Denies nausea/vomiting. States she has had formed black stools for at least one month, maybe up to 6 months. Reports history of iron supplementation, though states she has never been told she has a low blood count/iron count before. Last hgb 10.0 one month ago, upon admission hgb 8.0. unsure of family history of colon cancer. Reports history of constipation, seldom has soft stools. Typically has  infrequent stools with straining. States she had a colonoscopy 30+ years ago, unsure why or what it showed.  Denies NSAIDs, tobacco, alcohol products. Takes daily ASA per medication reports, thinks she takes this daily with her usual meds.  Past Medical History:  Diagnosis Date   Allergy    Anemia    Carotid stenosis    Chronic combined systolic and diastolic CHF (congestive heart failure) (HCC)    CKD (chronic kidney disease), stage IV (HCC) 09/24/2013   Pt at [facility], Dr. Sanford   COVID-19 10/24/2021   Diabetes mellitus type 2 in nonobese (HCC)    Edema 09/14/2013   GERD (gastroesophageal reflux disease)    History of ovarian cancer    Hyperlipidemia    Hypertension    Mild CAD    non-obstructive by LHC (09/25/2013): Proximal and mid LAD serial 20%, proximal circumflex 30%, mid AV groove circumflex 30%, mid RCA mild plaque.   NICM (nonischemic cardiomyopathy) (HCC)    Obesity (BMI 30-39.9)    RBBB    Renal artery stenosis (HCC) 10/11/2018   Thyroid disease    Seen by specialist    Surgical History:  She  has a past surgical history that includes left heart catheterization with coronary angiogram (N/A, 09/25/2013) and RENAL ANGIOGRAPHY (N/A, 10/06/2018). Family History:  Her family history includes Diabetes in her father; Hypertension in her father and maternal grandfather. Social History:   reports that she has never   smoked. She has never used smokeless tobacco. She reports that she does not drink alcohol and does not use drugs.  Prior to Admission medications   Medication Sig Start Date End Date Taking? Authorizing Provider  amLODipine (NORVASC) 10 MG tablet Take 1 tablet (10 mg total) by mouth daily. 11/14/22  Yes Regalado, Belkys A, MD  aspirin EC 81 MG tablet Take 1 tablet (81 mg total) by mouth daily. Swallow whole. 11/14/22  Yes Regalado, Belkys A, MD  azelastine (ASTELIN) 0.1 % nasal spray Place 2 sprays into both nostrils 2 (two) times daily. 12/02/22  Yes [provider]  carvedilol (COREG) 25 MG tablet Take 1 tablet (25 mg total) by mouth 2 (two) times daily with a meal. 11/13/22  Yes Regalado, Belkys A, MD  cetirizine (ZYRTEC) 10 MG tablet Take 10 mg by mouth daily as needed for allergies. 11/19/22  Yes [provider]  cyanocobalamin 1000 MCG tablet Take 1 tablet (1,000 mcg total) by mouth daily. 11/14/22  Yes Regalado, Belkys A, MD  docusate sodium (COLACE) 100 MG capsule Take 1 capsule (100 mg total) by mouth 2 (two) times daily. Patient taking differently: Take 100 mg by mouth 2 (two) times daily as needed for mild constipation. 11/13/22  Yes Regalado, Belkys A, MD  famotidine (PEPCID) 40 MG tablet Take 40 mg by mouth daily. 09/04/21  Yes [provider]  isosorbide mononitrate (IMDUR) 60 MG 24 hr tablet Take 1 tablet (60 mg total) by mouth daily. 12/14/22  Yes Conte, Tessa N, PA-C  loratadine (CLARITIN) 10 MG tablet Take 1 tablet (10 mg total) by mouth daily. 12/04/22 01/03/23 Yes Curatolo, Adam, DO  pantoprazole (PROTONIX) 20 MG tablet Take 1 tablet (20 mg total) by mouth daily. 11/29/22 03/29/23 Yes Schutt, Alexander M, PA-C  QUEtiapine (SEROQUEL) 25 MG tablet Take 1 tablet (25 mg total) by mouth 2 (two) times daily as needed (Agitation). 11/13/22  Yes Regalado, Belkys A, MD  QUEtiapine (SEROQUEL) 50 MG tablet Take 1 tablet (50 mg total) by mouth at bedtime. 11/13/22  Yes Regalado, Belkys A, MD  rosuvastatin (CRESTOR) 40 MG tablet Take 1 tablet (40 mg total) by mouth daily. 12/17/22  Yes Conte, Tessa N, PA-C  thiamine (VITAMIN B-1) 100 MG tablet Take 1 tablet (100 mg total) by mouth daily. 11/14/22  Yes Regalado, Belkys A, MD  fluticasone (FLONASE) 50 MCG/ACT nasal spray Place 2 sprays into both nostrils daily. 11/19/22   [provider]  glucose blood (ONE TOUCH ULTRA TEST) test strip Use to check blood sugar 3 times daily Dx code E11.65 Patient taking differently: 1 each by Other route See admin instructions. Use to check blood  sugar 3 times daily Dx code E11.65 11/20/14   Kumar, Ajay, MD  iron polysaccharides (NIFEREX) 150 MG capsule Take 1 capsule (150 mg total) by mouth daily. Patient not taking: Reported on 12/22/2022 11/14/22   Regalado, Belkys A, MD    Current Facility-Administered Medications  Medication Dose Route Frequency Provider Last Rate Last Admin   0.9 %  sodium chloride infusion  250 mL Intravenous PRN Doutova, Anastassia, MD       acetaminophen (TYLENOL) tablet 650 mg  650 mg Oral Q6H PRN Doutova, Anastassia, MD       Or   acetaminophen (TYLENOL) suppository 650 mg  650 mg Rectal Q6H PRN Doutova, Anastassia, MD       albuterol (PROVENTIL) (2.5 MG/3ML) 0.083% nebulizer solution 2.5 mg  2.5 mg Nebulization Q2H PRN Doutova, Anastassia, MD         guaiFENesin (MUCINEX) 12 hr tablet 600 mg  600 mg Oral BID Doutova, Anastassia, MD       HYDROcodone-acetaminophen (NORCO/VICODIN) 5-325 MG per tablet 1-2 tablet  1-2 tablet Oral Q4H PRN Doutova, Anastassia, MD       insulin aspart (novoLOG) injection 0-6 Units  0-6 Units Subcutaneous Q4H Doutova, Anastassia, MD       labetalol (NORMODYNE) injection 10 mg  10 mg Intravenous Q2H PRN Doutova, Anastassia, MD       loratadine (CLARITIN) tablet 10 mg  10 mg Oral Daily Doutova, Anastassia, MD       ondansetron (ZOFRAN) tablet 4 mg  4 mg Oral Q6H PRN Doutova, Anastassia, MD       Or   ondansetron (ZOFRAN) injection 4 mg  4 mg Intravenous Q6H PRN Doutova, Anastassia, MD       pantoprazole (PROTONIX) injection 40 mg  40 mg Intravenous Q12H Doutova, Anastassia, MD   40 mg at 12/22/22 0004   rosuvastatin (CRESTOR) tablet 40 mg  40 mg Oral Daily Doutova, Anastassia, MD       sodium chloride flush (NS) 0.9 % injection 3 mL  3 mL Intravenous Q12H Doutova, Anastassia, MD   3 mL at 12/22/22 0005   sodium chloride flush (NS) 0.9 % injection 3 mL  3 mL Intravenous PRN Doutova, Anastassia, MD        Allergies as of 12/21/2022 - Review Complete 12/21/2022  Allergen Reaction Noted    Hydralazine hcl Itching 10/04/2018   Atorvastatin Other (See Comments) 11/27/2018    Review of Systems  Constitutional:  Negative for chills, fever and weight loss.  HENT:  Negative for hearing loss and tinnitus.   Eyes:  Negative for blurred vision and double vision.  Respiratory:  Negative for cough and shortness of breath.   Cardiovascular:  Negative for chest pain and palpitations.  Gastrointestinal:  Positive for abdominal pain, constipation and melena. Negative for blood in stool, diarrhea, heartburn, nausea and vomiting.  Genitourinary:  Negative for dysuria and urgency.  Musculoskeletal:  Negative for myalgias and neck pain.  Skin:  Negative for itching and rash.  Neurological:  Negative for seizures.  Psychiatric/Behavioral:  Negative for depression and suicidal ideas.        Physical Exam:  Vital signs in last 24 hours: Temp:  [97.7 F (36.5 C)-98.6 F (37 C)] 97.7 F (36.5 C) (04/30 0834) Pulse Rate:  [62-76] 74 (04/30 0834) Resp:  [15-20] 17 (04/30 0834) BP: (130-185)/(55-76) 185/72 (04/30 0834) SpO2:  [94 %-100 %] 99 % (04/30 0834) Weight:  [68 kg-72.2 kg] 72.2 kg (04/30 0315) Last BM Date : 12/21/22 Last BM recorded by nurses in past 5 days No data recorded  Physical Exam Constitutional:      Appearance: Normal appearance.  HENT:     Head: Normocephalic and atraumatic.     Nose: Nose normal. No congestion.     Mouth/Throat:     Mouth: Mucous membranes are moist.     Pharynx: Oropharynx is clear.  Eyes:     Extraocular Movements: Extraocular movements intact.     Comments: Conjunctival pallor  Cardiovascular:     Rate and Rhythm: Normal rate and regular rhythm.  Pulmonary:     Effort: Pulmonary effort is normal. No respiratory distress.  Abdominal:     General: Abdomen is flat. Bowel sounds are normal. There is no distension.     Palpations: Abdomen is soft. There is no mass.     Tenderness: There is no   abdominal tenderness. There is no guarding or  rebound.     Hernia: No hernia is present.  Musculoskeletal:        General: No swelling. Normal range of motion.     Cervical back: Normal range of motion and neck supple.  Skin:    General: Skin is warm and dry.     Coloration: Skin is not jaundiced.  Neurological:     General: No focal deficit present.     Mental Status: She is alert and oriented to person, place, and time.  Psychiatric:        Mood and Affect: Mood normal.        Behavior: Behavior normal.        Thought Content: Thought content normal.        Judgment: Judgment normal.      LAB RESULTS: Recent Labs    12/21/22 2120 12/22/22 0715  WBC 5.9 5.0  HGB 8.1* 8.0*  HCT 26.4* 25.3*  PLT 254 242   BMET Recent Labs    12/21/22 2120 12/22/22 0715  NA 140 143  K 4.3 4.1  CL 109 111  CO2 22 22  GLUCOSE 139* 85  BUN 49* 44*  CREATININE 4.47* 4.52*  CALCIUM 8.6* 8.6*   LFT Recent Labs    12/22/22 0000 12/22/22 0715  PROT 5.8* 5.4*  ALBUMIN 2.9* 2.6*  AST 17 13*  ALT 10 9  ALKPHOS 50 46  BILITOT 0.8 0.4  BILIDIR 0.2  --   IBILI 0.6  --    PT/INR No results for input(s): "LABPROT", "INR" in the last 72 hours.  STUDIES: No results found.    Impression    Melena, anemia -Hgb 8.0 (10.0 1-month ago) -BUN 44, creatinine 4.52 - iron 57, ferritin 14 History of burning epigastric pain and reflux noncompliant with PPI. Unsure if melena is true GI bleed with history of iron supplementation and relatively stable hgb, though cannot rule out. EGD for gastritis, esophagitis, PUD would be useful. Anemia likely multifactorial with okay iron studies and history of CKD  Constipation  CAD - on ASA  DM2  CKD stage IV -BUN 44, creatinine 4.52 -GFR 10   Plan   Plan for EGD today. I thoroughly discussed the procedures to include nature, alternatives, benefits, and risks including but not limited to bleeding, perforation, infection, anesthesia/cardiac and pulmonary complications. Patient provides  understanding and gave verbal consent to proceed. Continue Protonix IV 40mg BID NPO  Miralax 17g BID If she does not undergo colonoscopy during this admission, would recommend colonoscopy as outpatient with primary GI for constipation and screening for colon cancer  Thank you for your kind consultation, we will continue to follow.   Bayley M McMichael  12/22/2022, 8:38 AM  

## 2022-12-22 NOTE — ED Notes (Signed)
Patient denies pain and is resting comfortably. ED TO INPATIENT HANDOFF REPORT  ED Nurse Name and Phone #: Jodie Echevaria [phone]  S Name/Age/Gender Teresa Poole 72 y.o. female Room/Bed: 037C/037C  Code Status   Code Status: Full Code  Home/SNF/Other Home Patient oriented to: self, place, time, and situation Is this baseline? Yes   Triage Complete: Triage complete  Chief Complaint Melena [K92.1]  Triage Note No notes on file   Allergies Allergies  Allergen Reactions   Hydralazine Hcl Itching    ENTIRE BODY = burning sensation, also in the eyes (they have become very sensitive to light)   Atorvastatin Other (See Comments)    Per MD - pt not sure of reaction     Level of Care/Admitting Diagnosis ED Disposition     ED Disposition  Admit   Condition  --   Comment  Hospital Area: [facility] [[id number]]  Level of Care: Telemetry Medical [104]  May admit patient to [facility] or [facility] if equivalent level of care is available:: No  Covid Evaluation: Asymptomatic - no recent exposure (last 10 days) testing not required  Diagnosis: Melena [170500]  Admitting Physician: Therisa Doyne [[id number]]  Attending Physician: Therisa Doyne [[id number]]  Certification:: I certify this patient will need inpatient services for at least 2 midnights  Estimated Length of Stay: 2          B Medical/Surgery History Past Medical History:  Diagnosis Date   Allergy    Anemia    Carotid stenosis    Chronic combined systolic and diastolic CHF (congestive heart failure) (HCC)    CKD (chronic kidney disease), stage IV (HCC) 09/24/2013   Pt at [facility], Dr. Marisue Humble   COVID-19 10/24/2021   Diabetes mellitus type 2 in nonobese [facility])    Edema 09/14/2013   GERD (gastroesophageal reflux disease)    History of ovarian cancer    Hyperlipidemia    Hypertension    Mild CAD    non-obstructive by LHC (09/25/2013): Proximal and mid LAD serial 20%, proximal circumflex  30%, mid AV groove circumflex 30%, mid RCA mild plaque.   NICM (nonischemic cardiomyopathy) (HCC)    Obesity (BMI 30-39.9)    RBBB    Renal artery stenosis (HCC) 10/11/2018   Thyroid disease    Seen by specialist   Past Surgical History:  Procedure Laterality Date   LEFT HEART CATHETERIZATION WITH CORONARY ANGIOGRAM N/A 09/25/2013   Procedure: LEFT HEART CATHETERIZATION WITH CORONARY ANGIOGRAM;  Surgeon: Kathleene Hazel, MD;  Location: [facility] CATH LAB;  Service: Cardiovascular;  Laterality: N/A;   RENAL ANGIOGRAPHY N/A 10/06/2018   Procedure: RENAL ANGIOGRAPHY;  Surgeon: Cephus Shelling, MD;  Location: [facility] INVASIVE CV LAB;  Service: Cardiovascular;  Laterality: N/A;     A IV Location/Drains/Wounds Patient Lines/Drains/Airways Status     Active Line/Drains/Airways     Name Placement date Placement time Site Days   Peripheral IV 12/22/22 20 G Right Antecubital 12/22/22  0000  Antecubital  less than 1            Intake/Output Last 24 hours No intake or output data in the 24 hours ending 12/22/22 0156  Labs/Imaging Results for orders placed or performed during the hospital encounter of 12/21/22 (from the past 48 hour(s))  CBC with Differential     Status: Abnormal   Collection Time: 12/21/22  9:20 PM  Result Value Ref Range   WBC 5.9 4.0 - 10.5 K/uL   RBC 2.88 (L) 3.87 -  5.11 MIL/uL   Hemoglobin 8.1 (L) 12.0 - 15.0 g/dL   HCT 69.6 (L) 29.5 - 28.4 %   MCV 91.7 80.0 - 100.0 fL   MCH 28.1 26.0 - 34.0 pg   MCHC 30.7 30.0 - 36.0 g/dL   RDW 13.2 44.0 - 10.2 %   Platelets 254 150 - 400 K/uL   nRBC 0.0 0.0 - 0.2 %   Neutrophils Relative % 64 %   Neutro Abs 3.8 1.7 - 7.7 K/uL   Lymphocytes Relative 25 %   Lymphs Abs 1.5 0.7 - 4.0 K/uL   Monocytes Relative 8 %   Monocytes Absolute 0.5 0.1 - 1.0 K/uL   Eosinophils Relative 2 %   Eosinophils Absolute 0.1 0.0 - 0.5 K/uL   Basophils Relative 1 %   Basophils Absolute 0.0 0.0 - 0.1 K/uL   Immature Granulocytes 0 %   Abs  Immature Granulocytes 0.01 0.00 - 0.07 K/uL    Comment: Performed at [facility] Lab, [address], [place], [place] [address]  Basic metabolic panel     Status: Abnormal   Collection Time: 12/21/22  9:20 PM  Result Value Ref Range   Sodium 140 135 - 145 mmol/L   Potassium 4.3 3.5 - 5.1 mmol/L   Chloride 109 98 - 111 mmol/L   CO2 22 22 - 32 mmol/L   Glucose, Bld 139 (H) 70 - 99 mg/dL    Comment: Glucose reference range applies only to samples taken after fasting for at least 8 hours.   BUN 49 (H) 8 - 23 mg/dL   Creatinine, Ser 6.44 (H) 0.44 - 1.00 mg/dL   Calcium 8.6 (L) 8.9 - 10.3 mg/dL   GFR, Estimated 10 (L) >60 mL/min    Comment: (NOTE) Calculated using the CKD-EPI Creatinine Equation (2021)    Anion gap 9 5 - 15    Comment: Performed at [facility] Lab, [address] [place], [place] [address]  ABO/Rh     Status: None   Collection Time: 12/21/22  9:20 PM  Result Value Ref Range   ABO/RH(D)      O POS Performed at [facility] Lab, [address] [place], [place] [address]   CBG monitoring, ED     Status: Abnormal   Collection Time: 12/21/22 11:54 PM  Result Value Ref Range   Glucose-Capillary 130 (H) 70 - 99 mg/dL    Comment: Glucose reference range applies only to samples taken after fasting for at least 8 hours.   Comment 1 Notify RN    Comment 2 Document in Chart   Type and screen [facility]     Status: None   Collection Time: 12/22/22 12:00 AM  Result Value Ref Range   ABO/RH(D) O POS    Antibody Screen NEG    Sample Expiration      12/25/2022,2359 Performed at [facility] Lab, [address], [place], [place] [address]   CK     Status: None   Collection Time: 12/22/22 12:00 AM  Result Value Ref Range   Total CK 115 38 - 234 U/L    Comment: Performed at [facility] Lab, [address], [place], [place] [address]  Magnesium     Status: None   Collection Time: 12/22/22 12:00 AM  Result Value Ref Range   Magnesium 1.9 1.7 -  2.4 mg/dL    Comment: Performed at [facility] Lab, [address] [place], [place] [address]  Phosphorus     Status: Abnormal   Collection Time: 12/22/22 12:00 AM  Result Value Ref Range   Phosphorus 5.1 (H) 2.5 - 4.6 mg/dL    Comment: Performed at [facility] Lab, [address], [place], [place] [address]  Vitamin B12     Status: Abnormal   Collection Time: 12/22/22 12:00 AM  Result Value Ref Range   Vitamin B-12 1,019 (H) 180 - 914 pg/mL    Comment: (NOTE) This assay is not validated for testing neonatal or myeloproliferative syndrome specimens for Vitamin B12 levels. Performed at [facility] Lab, [address] [place], [place] [address]   Iron and TIBC     Status: None   Collection Time: 12/22/22 12:00 AM  Result Value Ref Range   Iron 57 28 - 170 ug/dL   TIBC 098 119 - 147 ug/dL   Saturation Ratios 18 10.4 - 31.8 %   UIBC 265 ug/dL    Comment: Performed at [facility] Lab, [address], [place], [place] [address]  Ferritin     Status: None   Collection Time: 12/22/22 12:00 AM  Result Value Ref Range   Ferritin 14 11 - 307 ng/mL    Comment: Performed at [facility] Lab, [address] [place], [place] [address]  Reticulocytes     Status: Abnormal   Collection Time: 12/22/22 12:00 AM  Result Value Ref Range   Retic Ct Pct 1.5 0.4 - 3.1 %   RBC. 3.00 (L) 3.87 - 5.11 MIL/uL   Retic Count, Absolute 43.8 19.0 - 186.0 K/uL   Immature Retic Fract 15.2 2.3 - 15.9 %    Comment: Performed at [facility] Lab, [address], [place], [place] [address]  Hepatic function panel     Status: Abnormal   Collection Time: 12/22/22 12:00 AM  Result Value Ref Range   Total Protein 5.8 (L) 6.5 - 8.1 g/dL   Albumin 2.9 (L) 3.5 - 5.0 g/dL   AST 17 15 - 41 U/L   ALT 10 0 - 44 U/L   Alkaline Phosphatase 50 38 - 126 U/L   Total Bilirubin 0.8 0.3 - 1.2 mg/dL   Bilirubin, Direct 0.2 0.0 - 0.2 mg/dL   Indirect Bilirubin 0.6 0.3 - 0.9 mg/dL    Comment: Performed at  [facility] Lab, [address], [place], [place] [address]  Folate     Status: None   Collection Time: 12/22/22 12:00 AM  Result Value Ref Range   Folate 16.9 >5.9 ng/mL    Comment: Performed at [facility] Lab, [address], [place], [place] [address]  TSH     Status: None   Collection Time: 12/22/22 12:00 AM  Result Value Ref Range   TSH 2.052 0.350 - 4.500 uIU/mL    Comment: Performed by a 3rd Generation assay with a functional sensitivity of <=0.01 uIU/mL. Performed at [facility] Lab, [address] [place], [place] [address]   Urinalysis, Complete w Microscopic -Urine, Clean Catch     Status: Abnormal   Collection Time: 12/22/22 12:28 AM  Result Value Ref Range   Color, Urine YELLOW YELLOW   APPearance CLEAR CLEAR   Specific Gravity, Urine 1.011 1.005 - 1.030   pH 6.0 5.0 - 8.0   Glucose, UA NEGATIVE NEGATIVE mg/dL   Hgb urine dipstick NEGATIVE NEGATIVE   Bilirubin Urine NEGATIVE NEGATIVE   Ketones, ur NEGATIVE NEGATIVE mg/dL   Protein, ur >=102 (A)  NEGATIVE mg/dL   Nitrite NEGATIVE NEGATIVE   Leukocytes,Ua TRACE (A) NEGATIVE   RBC / HPF 0-5 0 - 5 RBC/hpf   WBC, UA 11-20 0 - 5 WBC/hpf   Bacteria, UA FEW (A) NONE SEEN   Squamous Epithelial / HPF 0-5 0 - 5 /HPF   Mucus PRESENT     Comment: Performed at [facility] Lab, [address] [place], [place] [address]   No results found.  Pending Labs Unresulted Labs (From admission, onward)     Start     Ordered   12/22/22 0500  CBC  Now then every 8 hours,   R      12/21/22 2331   12/22/22 0500  Magnesium  Tomorrow morning,   R        12/21/22 2353   12/22/22 0500  Phosphorus  Tomorrow morning,   R        12/21/22 2353   12/22/22 0500  Comprehensive metabolic panel  Tomorrow morning,   R       Question:  Release to patient  Answer:  Immediate   12/21/22 2353            Vitals/Pain Today's Vitals   12/21/22 2330 12/21/22 2345 12/22/22 0013 12/22/22 0130  BP: (!) 135/56 135/61  (!) 130/58   Pulse: 71 68  67  Resp: 20 15  18   Temp:   98.5 F (36.9 C)   TempSrc:   Oral   SpO2: 94% 99%  97%  Weight:      Height:      PainSc:        Isolation Precautions No active isolations  Medications Medications  pantoprazole (PROTONIX) injection 40 mg (40 mg Intravenous Given 12/22/22 0004)  loratadine (CLARITIN) tablet 10 mg (has no administration in time range)  rosuvastatin (CRESTOR) tablet 40 mg (has no administration in time range)  acetaminophen (TYLENOL) tablet 650 mg (has no administration in time range)    Or  acetaminophen (TYLENOL) suppository 650 mg (has no administration in time range)  HYDROcodone-acetaminophen (NORCO/VICODIN) 5-325 MG per tablet 1-2 tablet (has no administration in time range)  ondansetron (ZOFRAN) tablet 4 mg (has no administration in time range)    Or  ondansetron (ZOFRAN) injection 4 mg (has no administration in time range)  sodium chloride flush (NS) 0.9 % injection 3 mL (3 mLs Intravenous Given 12/22/22 0005)  sodium chloride flush (NS) 0.9 % injection 3 mL (has no administration in time range)  0.9 %  sodium chloride infusion (has no administration in time range)  albuterol (PROVENTIL) (2.5 MG/3ML) 0.083% nebulizer solution 2.5 mg (has no administration in time range)  guaiFENesin (MUCINEX) 12 hr tablet 600 mg (600 mg Oral Not Given 12/22/22 0015)  insulin aspart (novoLOG) injection 0-6 Units ( Subcutaneous Not Given 12/21/22 2357)  labetalol (NORMODYNE) injection 10 mg (has no administration in time range)    Mobility walks     Focused Assessments Cardiac Assessment Handoff:    Lab Results  Component Value Date   CKTOTAL 115 12/22/2022   TROPONINI <0.03 11/27/2018   Lab Results  Component Value Date   DDIMER 1.61 (H) 02/28/2022   Does the Patient currently have chest pain? No   , Neuro Assessment Handoff:  Swallow screen pass? Yes          Neuro Assessment: Within Defined Limits Neuro Checks:      Has TPA been given?  No If patient is a Neuro Trauma and patient is  going to OR before floor call report to 4N Charge nurse: [phone] or [phone]  , Pulmonary Assessment Handoff:  Lung sounds:   O2 Device: Room Air      R Recommendations: See Admitting Provider Note  Report given to:   Additional Notes: n/a

## 2022-12-22 NOTE — Progress Notes (Signed)
Initial Nutrition Assessment  DOCUMENTATION CODES:   Not applicable  INTERVENTION:   - Will monitor PO intake and assess need for oral nutrition supplements once diet advanced  - Recommend 2 gram Sodium diet once diet advanced to regular textures  - Renal MVI daily  NUTRITION DIAGNOSIS:   Inadequate oral intake related to altered GI function as evidenced by NPO status.  GOAL:   Patient will meet greater than or equal to 90% of their needs  MONITOR:   Diet advancement, PO intake, Labs, Weight trends  REASON FOR ASSESSMENT:   Consult Assessment of nutrition requirement/status  ASSESSMENT:   72 year old female who presented to the ED on 4/29 after accidental drug overdose. PMH of T2DM, CHF, CKD stage IV, ovarian cancer, HLD, HTN.  Per GI PA note, pt has been having burning epigastric pain ongoing for over a year that is worse with eating. Pt has not had N/V. Pt is currently NPO for EGD today.  Spoke with pt at bedside. Pt reports that her appetite PTA was at her baseline. Pt states, "I don't miss a meal!" Pt shares that she does not experience "hunger pains" and has not felt hungry in "years." She states that she has to remind herself to eat and usually eats at least 1 meal daily. Pt drinks milk or tea between meals. A typical meal may include meat, a vegetable, and water.  Pt denies any recent weight loss. She reports a UBW of around 155 lbs. Reviewed weight history in chart. If accurate, pt has experienced an 8.1 kg weight loss since 05/14/22. This is a 10.1% weight loss in 7 months which is not clinically significant for timeframe.  Pt shares that she tries to avoid fried foods for her heart. Pt does not consume oral nutrition supplements due to not knowing the sugar content and trying to watch her blood sugars. RD to order renal MVI daily given hx of CKD stage IV. Will monitor for diet advancement and need to order oral nutrition supplements.  Medications reviewed and  include: SSI q 4 hours, IV protonix  Labs reviewed: BUN 44, creatinine 4.52, phosphorus 4.9, hemoglobin 8.0 CBG's: 79-130 x 24 hours  NUTRITION - FOCUSED PHYSICAL EXAM:  Flowsheet Row Most Recent Value  Orbital Region No depletion  Upper Arm Region No depletion  Thoracic and Lumbar Region No depletion  Buccal Region No depletion  Temple Region No depletion  Clavicle Bone Region Mild depletion  Clavicle and Acromion Bone Region Mild depletion  Scapular Bone Region No depletion  Dorsal Hand No depletion  Patellar Region Mild depletion  Anterior Thigh Region Mild depletion  Posterior Calf Region Mild depletion  Edema (RD Assessment) None  Hair Reviewed  Eyes Reviewed  Mouth Reviewed  Skin Reviewed  Nails Reviewed       Diet Order:   Diet Order             Diet NPO time specified  Diet effective now                   EDUCATION NEEDS:   Education needs have been addressed  Skin:  Skin Assessment: Reviewed RN Assessment  Last BM:  12/21/22  Height:   Ht Readings from Last 1 Encounters:  12/22/22 5\' 7"  (1.702 m)    Weight:   Wt Readings from Last 1 Encounters:  12/22/22 72.2 kg    BMI:  Body mass index is 24.93 kg/m.  Estimated Nutritional Needs:   Kcal:  1800-2000  Protein:  85-100 grams  Fluid:  >1.8 L    Mertie Clause, MS, RD, LDN Inpatient Clinical Dietitian Please see [facility] for contact information.

## 2022-12-22 NOTE — Care Management CC44 (Signed)
Condition Code 44 Documentation Completed  Patient Details  Name: Teresa Poole MRN: [id number] Date of Birth: 08/02/1951   Condition Code 44 given:  Yes Patient signature on Condition Code 44 notice:  Yes Documentation of 2 MD's agreement:  Yes Code 44 added to claim:  Yes    Tom-Johnson, Hershal Coria, RN 12/22/2022, 2:46 PM

## 2022-12-22 NOTE — Evaluation (Signed)
Physical Therapy Evaluation  Patient Details Name: Teresa Poole MRN: [id number] DOB: 03-09-1951 Today's Date: 12/22/2022  History of Present Illness  Pt is a 72 y/o female who presents 12/21/2022 with an unintentional overdose and 1-2 weeks of black stools. Upper GI endoscopy on 12/22/22. PMH significant for DM II, CHF, CKD IV, carotid stenosis, ovarian CA, HTN, CAD, renal artery stenosis.   Clinical Impression  Patient evaluated by Physical Therapy with no further acute PT needs identified. All education has been completed and the patient has no further questions. At the time of PT eval pt intermittently agitated, cussing, and impulsively pulling leads off in attempts to d/c quicker. RN notified after session pt is at high risk of pulling out her IV. We were unable to fully complete evaluation but able to assess some functional mobility. Pt did demonstrate the ability to ambulate in the halls without difficulty, and SpO2 97% throughout session on RA. Anticipate pt is at baseline of function.  See below for any follow-up Physical Therapy or equipment needs. PT is signing off. Thank you for this referral.      Recommendations for follow up therapy are one component of a multi-disciplinary discharge planning process, led by the attending physician.  Recommendations may be updated based on patient status, additional functional criteria and insurance authorization.  Follow Up Recommendations       Assistance Recommended at Discharge None  Patient can return home with the following       Equipment Recommendations None recommended by PT  Recommendations for Other Services       Functional Status Assessment Patient has not had a recent decline in their functional status     Precautions / Restrictions Precautions Precautions: None Restrictions Weight Bearing Restrictions: No      Mobility  Bed Mobility               General bed mobility comments: Pt was received sitting up  EOB.    Transfers Overall transfer level: Independent Equipment used: None               General transfer comment: Pt stood quickly and without LOB    Ambulation/Gait Ambulation/Gait assistance: Independent Gait Distance (Feet): 400 Feet Assistive device: None Gait Pattern/deviations: WFL(Within Functional Limits) Gait velocity: WFL Gait velocity interpretation: >4.37 ft/sec, indicative of normal walking speed   General Gait Details: Pt walking very fast, agitated that therapist asking her to participate in evaluation. Able to stop quickly to talk and begin walking again without LOB.  Stairs         General stair comments: Did not attempt due to agitation. Anticipate pt will be able to manage stairs without difficulty based on ambulation speed and distance.  Wheelchair Mobility    Modified Rankin (Stroke Patients Only)       Balance Overall balance assessment: No apparent balance deficits (not formally assessed)                                           Pertinent Vitals/Pain Pain Assessment Pain Assessment: No/denies pain    Home Living Family/patient expects to be discharged to:: Private residence Living Arrangements: Alone Available Help at Discharge: Family;Available PRN/intermittently Type of Home: Apartment Home Access: Stairs to enter Entrance Stairs-Rails: Right Entrance Stairs-Number of Steps: 15   Home Layout: One level Home Equipment: Cane - single point  Prior Function Prior Level of Function : Independent/Modified Independent;Driving                     Hand Dominance   Dominant Hand: Right    Extremity/Trunk Assessment   Upper Extremity Assessment Upper Extremity Assessment: Overall WFL for tasks assessed    Lower Extremity Assessment Lower Extremity Assessment: Overall WFL for tasks assessed    Cervical / Trunk Assessment Cervical / Trunk Assessment: Other exceptions Cervical / Trunk  Exceptions: forward head posture with rounded shoulders  Communication   Communication: No difficulties  Cognition Arousal/Alertness: Awake/alert Behavior During Therapy: Agitated Overall Cognitive Status: No family/caregiver present to determine baseline cognitive functioning                                 General Comments: Throughout session pt either very calm and sweet or agitated. Hyperfocused on discharge and calling sister to come pick her up.        General Comments      Exercises     Assessment/Plan    PT Assessment Patient does not need any further PT services  PT Problem List         PT Treatment Interventions      PT Goals (Current goals can be found in the Care Plan section)  Acute Rehab PT Goals Patient Stated Goal: discharge ASAP PT Goal Formulation: All assessment and education complete, DC therapy    Frequency       Co-evaluation               AM-PAC PT "6 Clicks" Mobility  Outcome Measure Help needed turning from your back to your side while in a flat bed without using bedrails?: None Help needed moving from lying on your back to sitting on the side of a flat bed without using bedrails?: None Help needed moving to and from a bed to a chair (including a wheelchair)?: None Help needed standing up from a chair using your arms (e.g., wheelchair or bedside chair)?: None Help needed to walk in hospital room?: None Help needed climbing 3-5 steps with a railing? : None 6 Click Score: 24    End of Session   Activity Tolerance: Treatment limited secondary to agitation Patient left: in bed;with call bell/phone within reach Nurse Communication: Mobility status;Other (comment) (agitation) PT Visit Diagnosis: Difficulty in walking, not elsewhere classified (R26.2)    Time: 1610-9604 PT Time Calculation (min) (ACUTE ONLY): 10 min   Charges:   PT Evaluation $PT Eval Low Complexity: 1 Low          Conni Slipper, PT, DPT Acute  Rehabilitation Services Secure Chat Preferred Office: [phone]   Marylynn Pearson 12/22/2022, 2:52 PM

## 2022-12-23 LAB — SURGICAL PATHOLOGY

## 2022-12-23 NOTE — Anesthesia Postprocedure Evaluation (Signed)
Anesthesia Post Note  Patient: [name]  Procedure(s) Performed: ESOPHAGOGASTRODUODENOSCOPY (EGD) BIOPSY     Patient location during evaluation: PACU Anesthesia Type: MAC Level of consciousness: awake and alert Pain management: pain level controlled Vital Signs Assessment: post-procedure vital signs reviewed and stable Respiratory status: spontaneous breathing, nonlabored ventilation and respiratory function stable Cardiovascular status: blood pressure returned to baseline and stable Postop Assessment: no apparent nausea or vomiting Anesthetic complications: no   No notable events documented.  Last Vitals:  Vitals:   12/22/22 1320 12/22/22 1339  BP: (!) 147/80   Pulse: 69   Resp: (!) 27 19  Temp:    SpO2: 100%     Last Pain:  Vitals:   12/22/22 1320  TempSrc:   PainSc: 0-No pain                 Lowella Curb

## 2022-12-24 ENCOUNTER — Encounter (HOSPITAL_COMMUNITY): Payer: Self-pay | Admitting: Gastroenterology

## 2022-12-26 ENCOUNTER — Emergency Department (HOSPITAL_COMMUNITY): Payer: Medicare PPO

## 2022-12-26 ENCOUNTER — Other Ambulatory Visit: Payer: Self-pay

## 2022-12-26 ENCOUNTER — Emergency Department (HOSPITAL_COMMUNITY)
Admission: EM | Admit: 2022-12-26 | Discharge: 2022-12-27 | Disposition: A | Discharge status: Home / Self Care | Payer: Medicare PPO | Attending: Emergency Medicine | Admitting: Emergency Medicine

## 2022-12-26 DIAGNOSIS — E119 Type 2 diabetes mellitus without complications: Secondary | ICD-10-CM | POA: Diagnosis not present

## 2022-12-26 DIAGNOSIS — Z8543 Personal history of malignant neoplasm of ovary: Secondary | ICD-10-CM | POA: Insufficient documentation

## 2022-12-26 DIAGNOSIS — I2510 Atherosclerotic heart disease of native coronary artery without angina pectoris: Secondary | ICD-10-CM | POA: Diagnosis not present

## 2022-12-26 DIAGNOSIS — D649 Anemia, unspecified: Secondary | ICD-10-CM | POA: Diagnosis not present

## 2022-12-26 DIAGNOSIS — R1013 Epigastric pain: Secondary | ICD-10-CM | POA: Diagnosis not present

## 2022-12-26 DIAGNOSIS — N189 Chronic kidney disease, unspecified: Secondary | ICD-10-CM

## 2022-12-26 DIAGNOSIS — Z79899 Other long term (current) drug therapy: Secondary | ICD-10-CM | POA: Insufficient documentation

## 2022-12-26 DIAGNOSIS — Z7982 Long term (current) use of aspirin: Secondary | ICD-10-CM | POA: Diagnosis not present

## 2022-12-26 DIAGNOSIS — Z91148 Patient's other noncompliance with medication regimen for other reason: Secondary | ICD-10-CM | POA: Diagnosis not present

## 2022-12-26 DIAGNOSIS — N184 Chronic kidney disease, stage 4 (severe): Secondary | ICD-10-CM | POA: Diagnosis not present

## 2022-12-26 DIAGNOSIS — I130 Hypertensive heart and chronic kidney disease with heart failure and stage 1 through stage 4 chronic kidney disease, or unspecified chronic kidney disease: Secondary | ICD-10-CM | POA: Diagnosis not present

## 2022-12-26 DIAGNOSIS — I5022 Chronic systolic (congestive) heart failure: Secondary | ICD-10-CM | POA: Diagnosis not present

## 2022-12-26 LAB — COMPREHENSIVE METABOLIC PANEL
ALT: 11 U/L (ref 0–44)
AST: 14 U/L — ABNORMAL LOW (ref 15–41)
Albumin: 2.6 g/dL — ABNORMAL LOW (ref 3.5–5.0)
Alkaline Phosphatase: 60 U/L (ref 38–126)
Anion gap: 11 (ref 5–15)
BUN: 56 mg/dL — ABNORMAL HIGH (ref 8–23)
CO2: 21 mmol/L — ABNORMAL LOW (ref 22–32)
Calcium: 8.4 mg/dL — ABNORMAL LOW (ref 8.9–10.3)
Chloride: 109 mmol/L (ref 98–111)
Creatinine, Ser: 4.97 mg/dL — ABNORMAL HIGH (ref 0.44–1.00)
GFR, Estimated: 9 mL/min — ABNORMAL LOW (ref >60)
Glucose, Bld: 199 mg/dL — ABNORMAL HIGH (ref 70–99)
Potassium: 4.8 mmol/L (ref 3.5–5.1)
Sodium: 141 mmol/L (ref 135–145)
Total Bilirubin: 0.3 mg/dL (ref 0.3–1.2)
Total Protein: 5.7 g/dL — ABNORMAL LOW (ref 6.5–8.1)

## 2022-12-26 LAB — CBC
HCT: 23.3 % — ABNORMAL LOW (ref 36.0–46.0)
Hemoglobin: 7.4 g/dL — ABNORMAL LOW (ref 12.0–15.0)
MCH: 28.6 pg (ref 26.0–34.0)
MCHC: 31.8 g/dL (ref 30.0–36.0)
MCV: 90 fL (ref 80.0–100.0)
Platelets: 223 10*3/uL (ref 150–400)
RBC: 2.59 MIL/uL — ABNORMAL LOW (ref 3.87–5.11)
RDW: 13.7 % (ref 11.5–15.5)
WBC: 4.8 10*3/uL (ref 4.0–10.5)
nRBC: 0 % (ref 0.0–0.2)

## 2022-12-26 LAB — TROPONIN I (HIGH SENSITIVITY): Troponin I (High Sensitivity): 15 ng/L (ref <18)

## 2022-12-26 MED ORDER — ALUM & MAG HYDROXIDE-SIMETH 200-200-20 MG/5ML PO SUSP
30.0000 mL | Freq: Once | ORAL | Status: AC
  Administered 2022-12-26: 30 mL via ORAL
  Filled 2022-12-26: qty 30

## 2022-12-26 MED ORDER — LIDOCAINE VISCOUS HCL 2 % MT SOLN
15.0000 mL | Freq: Once | OROMUCOSAL | Status: AC
  Administered 2022-12-26: 15 mL via ORAL
  Filled 2022-12-26: qty 15

## 2022-12-26 NOTE — ED Triage Notes (Signed)
Pt arrived via [facility] for acid reflux from home. PMH of same, worsening today. EKG NS.   PTA EMS Vitals  BP 134/60 SPO2 99% RA HR 70 CBG 185

## 2022-12-26 NOTE — ED Provider Triage Note (Signed)
Emergency Medicine Provider Triage Evaluation Note  Teresa Poole , a 72 y.o. female  was evaluated in triage.  Pt complains of acid reflux.  History of the same.  Patient states she took Maalox this morning and it resolved her symptoms however she states that she is "burning really bad" right now.  She denies any vomiting but does have waterbrash and complains of chest pain.  History of CAD.  Review of Systems  Positive: Heartburn Negative: Vomiting  Physical Exam  BP 135/69 (BP Location: Right Arm)   Pulse 67   Temp 99.3 F (37.4 C) (Oral)   Resp 18   Ht 5\' 7"  (1.702 m)   Wt 72 kg   SpO2 99%   BMI 24.86 kg/m  Gen:   Awake, no distress   Resp:  Normal effort  MSK:   Moves extremities without difficulty  Other:  halitosis  Medical Decision Making  Medically screening exam initiated at 8:56 PM.  Appropriate orders placed.  GIBSON GATTA was informed that the remainder of the evaluation will be completed by another provider, this initial triage assessment does not replace that evaluation, and the importance of remaining in the ED until their evaluation is complete.     Arthor Captain, PA-C 12/26/22 2057

## 2022-12-26 NOTE — ED Notes (Signed)
Patient transported to X-ray 

## 2022-12-27 ENCOUNTER — Emergency Department (HOSPITAL_COMMUNITY): Payer: Medicare PPO

## 2022-12-27 LAB — TROPONIN I (HIGH SENSITIVITY): Troponin I (High Sensitivity): 17 ng/L (ref <18)

## 2022-12-27 MED ORDER — FAMOTIDINE IN NACL 20-0.9 MG/50ML-% IV SOLN: 20.0000 mg | Freq: Once | INTRAVENOUS | Status: DC

## 2022-12-27 MED ORDER — SODIUM CHLORIDE 0.9 % IV BOLUS: 1000.0000 mL | Freq: Once | INTRAVENOUS | Status: DC

## 2022-12-27 MED ORDER — ONDANSETRON HCL 4 MG/2ML IJ SOLN: 4.0000 mg | Freq: Once | INTRAMUSCULAR | Status: DC

## 2022-12-27 MED ORDER — SUCRALFATE 1 G PO TABS: 1.0000 g | ORAL_TABLET | Freq: Three times a day (TID) | ORAL | 0 refills | Status: AC

## 2022-12-27 MED ORDER — SUCRALFATE 1 G PO TABS
1.0000 g | ORAL_TABLET | Freq: Once | ORAL | Status: AC
  Administered 2022-12-27: 1 g via ORAL
  Filled 2022-12-27: qty 1

## 2022-12-27 MED ORDER — PANTOPRAZOLE SODIUM 40 MG PO TBEC
40.0000 mg | DELAYED_RELEASE_TABLET | Freq: Once | ORAL | Status: AC
  Administered 2022-12-27: 40 mg via ORAL
  Filled 2022-12-27: qty 1

## 2022-12-27 MED ORDER — PANTOPRAZOLE SODIUM 40 MG IV SOLR: 40.0000 mg | Freq: Once | INTRAVENOUS | Status: DC

## 2022-12-27 NOTE — Discharge Instructions (Signed)
It was a pleasure caring for you today in the emergency department.  Please return to the emergency department for any worsening or worrisome symptoms.  Please take your protonix as prescribed, add carafate as prescribed tonight. Please take your iron supplement as prescribed.   Avoid NSAIDS tobacco and alcohol use  Please follow up with GI for recheck

## 2022-12-27 NOTE — ED Provider Notes (Signed)
[place] EMERGENCY DEPARTMENT AT [facility] Provider Note  CSN: [id number] Arrival date & time: 12/26/22 2010  Chief Complaint(s) Heartburn  HPI Teresa Poole is a 72 y.o. female with past medical history as below, significant for combined CHF, DM 2, CKD stage IV, CAD, OSA noncompliant with CPAP who presents to the ED with complaint of concern for epigastric burning sensation.    EGD Dr Barron Alvine 4/30 w/ pos barrett's esopahgus pending bx results, normal o/w, no varices or ulceration  Admitted 4/29 with dc 4/30 2/2 overdose and ?GIB  Patient here for recurrent epigastric burning sensation.  Patient was asleep upon my arrival to the room, reporting 10/10 epigastric burning abdominal pain, no vomiting.  Does report dark stools chronically unchanged over the past few weeks.  No blood thinners, no daily NSAID use, no daily tobacco or alcohol use.  She was seen recently with similar complaint and had EGD completed.  She reports variable compliance with her Protonix and iron supplements.  No fevers, no CP or dib, has not f/u pcp since recent discharge.   Past Medical History Past Medical History:  Diagnosis Date   Allergy    Anemia    Carotid stenosis    Chronic combined systolic and diastolic CHF (congestive heart failure) (HCC)    CKD (chronic kidney disease), stage IV (HCC) 09/24/2013   Pt at [facility], Dr. Marisue Humble   COVID-19 10/24/2021   Diabetes mellitus type 2 in nonobese [facility])    Edema 09/14/2013   GERD (gastroesophageal reflux disease)    History of ovarian cancer    Hyperlipidemia    Hypertension    Mild CAD    non-obstructive by LHC (09/25/2013): Proximal and mid LAD serial 20%, proximal circumflex 30%, mid AV groove circumflex 30%, mid RCA mild plaque.   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) 11/07/2022   Acute kidney injury (HCC) 11/05/2022   Dizziness 04/17/2022   (HFpEF) heart failure with preserved ejection fraction (HCC) 02/28/2022   Hypomagnesemia 02/28/2022   Manic episode, unspecified (HCC)    COVID-19 virus infection 10/25/2021   UTI (urinary tract infection) 10/25/2021   Anemia 10/25/2021   Suicidal ideations 10/17/2021   Acute renal failure superimposed on stage 4 chronic kidney disease (HCC) 10/14/2021   ARF (acute renal failure) (HCC) 10/14/2021   Small bowel obstruction (HCC)    SBO (small bowel obstruction) (HCC) 09/22/2021   Adjustment disorder with anxiety 06/10/2021   Atypical chest pain 11/27/2018   Renal artery stenosis (HCC) 10/11/2018   Hyperlipidemia associated with type 2 diabetes mellitus (HCC) 10/26/2013   Chronic systolic heart failure (HCC) 09/28/2013   CKD (chronic kidney disease), stage IV (HCC) 09/24/2013   Hypertensive urgency 09/18/2013   Cardiomyopathy, hypertensive (HCC) 09/18/2013   Edema 09/14/2013   Acute systolic heart failure (HCC) 09/09/2013   Hypertensive heart disease with CHF (congestive heart failure) (HCC) 09/09/2013   Hypertension associated with diabetes (HCC) 09/07/2013   Type 2 diabetes mellitus with chronic kidney disease, without long-term current  use of insulin (HCC) 09/07/2013   Obesity (BMI 30-39.9)    History of ovarian cancer    Home Medication(s) Prior to Admission medications   Medication Sig Start Date End Date Taking? Authorizing Provider  sucralfate (CARAFATE) 1 g tablet Take 1 tablet (1 g total) by mouth with breakfast, with lunch, and with evening meal for 7 days. 12/27/22 01/03/23 Yes Tanda Rockers A,  DO  amLODipine (NORVASC) 10 MG tablet Take 1 tablet (10 mg total) by mouth daily. 11/14/22   Regalado, Jon Billings A, MD  aspirin EC 81 MG tablet Take 1 tablet (81 mg total) by mouth daily. Swallow whole. 11/14/22   Regalado, Belkys A, MD  azelastine (ASTELIN) 0.1 % nasal spray Place 2 sprays into both nostrils 2 (two) times daily. 12/02/22   [provider]  carvedilol (COREG) 25 MG tablet Take 1 tablet (25 mg total) by mouth 2 (two) times daily with a meal. 11/13/22   Regalado, Belkys A, MD  cetirizine (ZYRTEC) 10 MG tablet Take 10 mg by mouth daily as needed for allergies. 11/19/22   [provider]  cyanocobalamin 1000 MCG tablet Take 1 tablet (1,000 mcg total) by mouth daily. 11/14/22   Regalado, Belkys A, MD  docusate sodium (COLACE) 100 MG capsule Take 1 capsule (100 mg total) by mouth 2 (two) times daily. Patient taking differently: Take 100 mg by mouth 2 (two) times daily as needed for mild constipation. 11/13/22   Regalado, Belkys A, MD  fluticasone (FLONASE) 50 MCG/ACT nasal spray Place 2 sprays into both nostrils daily. 11/19/22   [provider]  glucose blood (ONE TOUCH ULTRA TEST) test strip Use to check blood sugar 3 times daily Dx code E11.65 Patient taking differently: 1 each by Other route See admin instructions. Use to check blood sugar 3 times daily Dx code E11.65 11/20/14   Reather Littler, MD  iron polysaccharides (NIFEREX) 150 MG capsule Take 1 capsule (150 mg total) by mouth daily. Patient not taking: Reported on 12/22/2022 11/14/22   Hartley Barefoot A, MD  isosorbide mononitrate (IMDUR) 60 MG 24 hr tablet Take 1 tablet (60 mg total) by mouth daily. 12/14/22   Sharlene Dory, PA-C  loratadine (CLARITIN) 10 MG tablet Take 1 tablet (10 mg total) by mouth daily. 12/04/22 01/03/23  Curatolo, Adam, DO  pantoprazole (PROTONIX) 20 MG tablet Take 1 tablet (20 mg total) by mouth 2 (two) times daily. 12/22/22 04/21/23  Dorcas Carrow, MD  QUEtiapine (SEROQUEL) 25 MG tablet Take 1  tablet (25 mg total) by mouth 2 (two) times daily as needed (Agitation). 11/13/22   Regalado, Belkys A, MD  QUEtiapine (SEROQUEL) 50 MG tablet Take 1 tablet (50 mg total) by mouth at bedtime. 11/13/22   Regalado, Belkys A, MD  rosuvastatin (CRESTOR) 40 MG tablet Take 1 tablet (40 mg total) by mouth daily. 12/17/22   Sharlene Dory, PA-C  thiamine (VITAMIN B-1) 100 MG tablet Take 1 tablet (100 mg total) by mouth daily. 11/14/22   Regalado, Prentiss Bells, MD  Past Surgical History Past Surgical History:  Procedure Laterality Date   BIOPSY  12/22/2022   Procedure: BIOPSY;  Surgeon: Shellia Cleverly, DO;  Location: [facility] ENDOSCOPY;  Service: Gastroenterology;;   ESOPHAGOGASTRODUODENOSCOPY N/A 12/22/2022   Procedure: ESOPHAGOGASTRODUODENOSCOPY (EGD);  Surgeon: Shellia Cleverly, DO;  Location: [facility] ENDOSCOPY;  Service: Gastroenterology;  Laterality: N/A;   LEFT HEART CATHETERIZATION WITH CORONARY ANGIOGRAM N/A 09/25/2013   Procedure: LEFT HEART CATHETERIZATION WITH CORONARY ANGIOGRAM;  Surgeon: Kathleene Hazel, MD;  Location: [facility] CATH LAB;  Service: Cardiovascular;  Laterality: N/A;   RENAL ANGIOGRAPHY N/A 10/06/2018   Procedure: RENAL ANGIOGRAPHY;  Surgeon: Cephus Shelling, MD;  Location: [facility] INVASIVE CV LAB;  Service: Cardiovascular;  Laterality: N/A;   Family History Family History  Problem Relation Age of Onset   Diabetes Father    Hypertension Father    Hypertension Maternal Grandfather     Social History Social History   Tobacco Use   Smoking status: Never   Smokeless tobacco: Never  Vaping Use   Vaping Use: Never used  Substance Use Topics   Alcohol use: No   Drug use: No   Allergies Hydralazine hcl and Atorvastatin  Review of Systems Review of Systems  Constitutional:  Negative for activity change and fever.  HENT:  Negative for facial swelling and  trouble swallowing.   Eyes:  Negative for discharge and redness.  Respiratory:  Negative for cough and shortness of breath.   Cardiovascular:  Negative for chest pain and palpitations.  Gastrointestinal:  Positive for abdominal pain. Negative for nausea and vomiting.  Genitourinary:  Negative for dysuria and flank pain.  Musculoskeletal:  Negative for back pain and gait problem.  Skin:  Negative for pallor and rash.  Neurological:  Negative for syncope and headaches.    Physical Exam Vital Signs  I have reviewed the triage vital signs BP (!) 159/75   Pulse 69   Temp 99.3 F (37.4 C) (Oral)   Resp 18   Ht 5\' 7"  (1.702 m)   Wt 72 kg   SpO2 100%   BMI 24.86 kg/m  Physical Exam Vitals and nursing note reviewed.  Constitutional:      General: She is not in acute distress.    Appearance: Normal appearance.  HENT:     Head: Normocephalic and atraumatic.     Right Ear: External ear normal.     Left Ear: External ear normal.     Nose: Nose normal.     Mouth/Throat:     Mouth: Mucous membranes are moist.  Eyes:     General: No scleral icterus.       Right eye: No discharge.        Left eye: No discharge.  Cardiovascular:     Rate and Rhythm: Normal rate and regular rhythm.     Pulses: Normal pulses.     Heart sounds: Normal heart sounds.  Pulmonary:     Effort: Pulmonary effort is normal. No respiratory distress.     Breath sounds: Normal breath sounds.  Abdominal:     General: Abdomen is flat.     Palpations: Abdomen is soft.     Tenderness: There is abdominal tenderness in the epigastric area. There is no guarding or rebound.     Comments: Abdomen not peritoneal  Musculoskeletal:        General: Normal range of motion.     Right lower leg: No edema.     Left lower leg: No edema.  Skin:  General: Skin is warm and dry.     Capillary Refill: Capillary refill takes less than 2 seconds.  Neurological:     Mental Status: She is alert.  Psychiatric:        Mood and  Affect: Mood normal.        Behavior: Behavior normal.     ED Results and Treatments Labs (all labs ordered are listed, but only abnormal results are displayed) Labs Reviewed  CBC - Abnormal; Notable for the following components:      Result Value   RBC 2.59 (*)    Hemoglobin 7.4 (*)    HCT 23.3 (*)    All other components within normal limits  COMPREHENSIVE METABOLIC PANEL - Abnormal; Notable for the following components:   CO2 21 (*)    Glucose, Bld 199 (*)    BUN 56 (*)    Creatinine, Ser 4.97 (*)    Calcium 8.4 (*)    Total Protein 5.7 (*)    Albumin 2.6 (*)    AST 14 (*)    GFR, Estimated 9 (*)    All other components within normal limits  TROPONIN I (HIGH SENSITIVITY)  TROPONIN I (HIGH SENSITIVITY)                                                                                                                          Radiology CT ABDOMEN PELVIS WO CONTRAST  Result Date: 12/27/2022 CLINICAL DATA:  Epic gastric EXAM: CT ABDOMEN AND PELVIS WITHOUT CONTRAST TECHNIQUE: Multidetector CT imaging of the abdomen and pelvis was performed following the standard protocol without IV contrast. RADIATION DOSE REDUCTION: This exam was performed according to the departmental dose-optimization program which includes automated exposure control, adjustment of the mA and/or kV according to patient size and/or use of iterative reconstruction technique. COMPARISON:  08/26/2021 FINDINGS: Lower chest: No acute abnormality Hepatobiliary: Gallbladder contracted. No focal hepatic abnormality or biliary ductal dilatation. Pancreas: No focal abnormality or ductal dilatation. Spleen: No focal abnormality.  Normal size. Adrenals/Urinary Tract: No adrenal abnormality. No suspicious focal renal abnormality. No stones or hydronephrosis. Urinary bladder is unremarkable. Stomach/Bowel: Normal appendix. Moderate stool burden throughout the colon. Stomach and small bowel decompressed. No bowel obstruction.  Vascular/Lymphatic: Aortic atherosclerosis. No evidence of aneurysm or adenopathy. Reproductive: Prior hysterectomy.  No adnexal masses. Other: No free fluid or free air. Musculoskeletal: No acute bony abnormality. IMPRESSION: No acute findings in the abdomen or pelvis. Moderate stool burden. Aortic atherosclerosis. Electronically Signed   By: Charlett Nose M.D.   On: 12/27/2022 02:18   DG Chest 2 View  Result Date: 12/26/2022 CLINICAL DATA:  Chest pains. EXAM: CHEST - 2 VIEW COMPARISON:  PA Lat 12/07/2022 FINDINGS: Heart size and vascular pattern are normal. The mediastinal configuration is normal. The aorta is heavily calcified. The lungs are clear. There is thoracic spondylosis and multilevel bridging enthesopathy. Compare: Unchanged. IMPRESSION: No active cardiopulmonary disease. Aortic atherosclerosis. Thoracic spondylosis and changes of DISH. Electronically Signed  By: Almira Bar M.D.   On: 12/26/2022 21:27    Pertinent labs & imaging results that were available during my care of the patient were reviewed by me and considered in my medical decision making (see MDM for details).  Medications Ordered in ED Medications  alum & mag hydroxide-simeth (MAALOX/MYLANTA) 200-200-20 MG/5ML suspension 30 mL (30 mLs Oral Given 12/26/22 2113)    And  lidocaine (XYLOCAINE) 2 % viscous mouth solution 15 mL (15 mLs Oral Given 12/26/22 2113)  sucralfate (CARAFATE) tablet 1 g (1 g Oral Given 12/27/22 0142)  pantoprazole (PROTONIX) EC tablet 40 mg (40 mg Oral Given 12/27/22 0154)                                                                                                                                     Procedures Procedures  (including critical care time)  Medical Decision Making / ED Course    Medical Decision Making:    Teresa Poole is a 72 y.o. female with past medical history as below, significant for combined CHF, DM 2, CKD stage IV, CAD, OSA noncompliant with CPAP who presents to the ED  with complaint of concern for epigastric burning sensation. . The complaint involves an extensive differential diagnosis and also carries with it a high risk of complications and morbidity.  Serious etiology was considered. Ddx includes but is not limited to: Differential diagnosis includes but is not exclusive to acute cholecystitis, intrathoracic causes for epigastric abdominal pain, gastritis, duodenitis, pancreatitis, small bowel or large bowel obstruction, abdominal aortic aneurysm, hernia, gastritis, etc.    Complete initial physical exam performed, notably the patient  was asleep, upon awakening she is complaining of epigastric burning sensation, abdomen is soft, nonperitoneal, vital signs stable..    Reviewed and confirmed nursing documentation for past medical history, family history, social history.  Vital signs reviewed.    Clinical Course as of 12/27/22 0257  Wynelle Link Dec 27, 2022  0233 Creatinine(!): 4.97 Similar baseline, she has CKD [SG]  0233 Potassium: 4.8 [SG]  0233 Hemoglobin(!): 7.4 8.0 five days ago  [SG]    Clinical Course User Index [SG] Sloan Leiter, DO   Labs and imaging were reviewed, these are stable. She has chronic anemia, she is on iron supplementation and is on Protonix.  Recent endoscopy 5 days ago was reviewed Tolerating po, abd soft; non peritoneal Burning sensation to her epigastrium has improved with intervention.  She is feeling better.  Recent diascopy was concern for possible Barrett's esophagus.  She has not been taking her Protonix or iron supplementation since being discharged.  Has not been following her dietary instructions.  Encouraged medication compliance and dietary compliance.  Advised her to follow-up with her GI doctor for recheck in the next 2 weeks  Recommend she continue increased dose of Protonix, add Carafate, bland diet, follow-up with GI in the outpatient setting.  Follow with PCP as  well.  The patient improved significantly and was  discharged in stable condition. Detailed discussions were had with the patient regarding current findings, and need for close f/u with PCP or on call doctor. The patient has been instructed to return immediately if the symptoms worsen in any way for re-evaluation. Patient verbalized understanding and is in agreement with current care plan. All questions answered prior to discharge.           Additional history obtained: -Additional history obtained from na -External records from outside source obtained and reviewed including: Chart review including previous notes, labs, imaging, consultation notes including recent admission, recent consultant notes, home medications, prior labs and imaging, prior ED notes     Lab Tests: -I ordered, reviewed, and interpreted labs.   The pertinent results include:   Labs Reviewed  CBC - Abnormal; Notable for the following components:      Result Value   RBC 2.59 (*)    Hemoglobin 7.4 (*)    HCT 23.3 (*)    All other components within normal limits  COMPREHENSIVE METABOLIC PANEL - Abnormal; Notable for the following components:   CO2 21 (*)    Glucose, Bld 199 (*)    BUN 56 (*)    Creatinine, Ser 4.97 (*)    Calcium 8.4 (*)    Total Protein 5.7 (*)    Albumin 2.6 (*)    AST 14 (*)    GFR, Estimated 9 (*)    All other components within normal limits  TROPONIN I (HIGH SENSITIVITY)  TROPONIN I (HIGH SENSITIVITY)    Notable for anemia noted, mildly worsened from prior.  EKG   EKG Interpretation  Date/Time:  Saturday Dec 26 2022 21:01:57 EDT Ventricular Rate:  64 PR Interval:  188 QRS Duration: 150 QT Interval:  480 QTC Calculation: 495 R Axis:   -18 Text Interpretation: Normal sinus rhythm Right bundle branch block Abnormal ECG When compared with ECG of 22-Dec-2022 00:06, PREVIOUS ECG IS PRESENT No significant change since last tracing Confirmed by Lorre Nick ([id number]) on 12/26/2022 10:33:38 PM         Imaging Studies ordered: I  ordered imaging studies including CT abdomen, chest x-ray I independently visualized the following imaging with scope of interpretation limited to determining acute life threatening conditions related to emergency care; findings noted above, significant for stable imaging I independently visualized and interpreted imaging. I agree with the radiologist interpretation   Medicines ordered and prescription drug management: Meds ordered this encounter  Medications   AND Linked Order Group    alum & mag hydroxide-simeth (MAALOX/MYLANTA) 200-200-20 MG/5ML suspension 30 mL    lidocaine (XYLOCAINE) 2 % viscous mouth solution 15 mL   DISCONTD: sodium chloride 0.9 % bolus 1,000 mL   DISCONTD: ondansetron (ZOFRAN) injection 4 mg   DISCONTD: famotidine (PEPCID) IVPB 20 mg premix   DISCONTD: pantoprazole (PROTONIX) injection 40 mg   sucralfate (CARAFATE) tablet 1 g   pantoprazole (PROTONIX) EC tablet 40 mg   sucralfate (CARAFATE) 1 g tablet    Sig: Take 1 tablet (1 g total) by mouth with breakfast, with lunch, and with evening meal for 7 days.    Dispense:  21 tablet    Refill:  0    -I have reviewed the patients home medicines and have made adjustments as needed   Consultations Obtained: na   Cardiac Monitoring: The patient was maintained on a cardiac monitor.  I personally viewed and interpreted the cardiac monitored which showed an  underlying rhythm of: NSR  Social Determinants of Health:  Diagnosis or treatment significantly limited by social determinants of health: na   Reevaluation: After the interventions noted above, I reevaluated the patient and found that they have improved  Co morbidities that complicate the patient evaluation  Past Medical History:  Diagnosis Date   Allergy    Anemia    Carotid stenosis    Chronic combined systolic and diastolic CHF (congestive heart failure) (HCC)    CKD (chronic kidney disease), stage IV (HCC) 09/24/2013   Pt at [facility], Dr.  Marisue Humble   COVID-19 10/24/2021   Diabetes mellitus type 2 in nonobese [facility])    Edema 09/14/2013   GERD (gastroesophageal reflux disease)    History of ovarian cancer    Hyperlipidemia    Hypertension    Mild CAD    non-obstructive by LHC (09/25/2013): Proximal and mid LAD serial 20%, proximal circumflex 30%, mid AV groove circumflex 30%, mid RCA mild plaque.   NICM (nonischemic cardiomyopathy) (HCC)    Obesity (BMI 30-39.9)    RBBB    Renal artery stenosis (HCC) 10/11/2018   Thyroid disease    Seen by specialist      Dispostion: Disposition decision including need for hospitalization was considered, and patient discharged from emergency department.    Final Clinical Impression(s) / ED Diagnoses Final diagnoses:  Epigastric pain  Chronic kidney disease, unspecified CKD stage  Noncompliance with medication regimen  Chronic anemia     This chart was dictated using voice recognition software.  Despite best efforts to proofread,  errors can occur which can change the documentation meaning.    Sloan Leiter, DO 12/27/22 [phone]

## 2023-01-03 ENCOUNTER — Emergency Department (HOSPITAL_COMMUNITY)
Admission: EM | Admit: 2023-01-03 | Discharge: 2023-01-03 | Disposition: A | Discharge status: Home / Self Care | Payer: Medicare PPO | Attending: Emergency Medicine | Admitting: Emergency Medicine

## 2023-01-03 ENCOUNTER — Encounter (HOSPITAL_COMMUNITY): Payer: Self-pay

## 2023-01-03 ENCOUNTER — Emergency Department (HOSPITAL_COMMUNITY): Payer: Medicare PPO

## 2023-01-03 DIAGNOSIS — Z7982 Long term (current) use of aspirin: Secondary | ICD-10-CM | POA: Insufficient documentation

## 2023-01-03 DIAGNOSIS — Z79899 Other long term (current) drug therapy: Secondary | ICD-10-CM | POA: Diagnosis not present

## 2023-01-03 DIAGNOSIS — I10 Essential (primary) hypertension: Secondary | ICD-10-CM | POA: Insufficient documentation

## 2023-01-03 DIAGNOSIS — R519 Headache, unspecified: Secondary | ICD-10-CM

## 2023-01-03 LAB — CBC WITH DIFFERENTIAL/PLATELET
Abs Immature Granulocytes: 0.01 10*3/uL (ref 0.00–0.07)
Basophils Absolute: 0 10*3/uL (ref 0.0–0.1)
Basophils Relative: 1 %
Eosinophils Absolute: 0.2 10*3/uL (ref 0.0–0.5)
Eosinophils Relative: 3 %
HCT: 28.2 % — ABNORMAL LOW (ref 36.0–46.0)
Hemoglobin: 8.7 g/dL — ABNORMAL LOW (ref 12.0–15.0)
Immature Granulocytes: 0 %
Lymphocytes Relative: 20 %
Lymphs Abs: 1.3 10*3/uL (ref 0.7–4.0)
MCH: 28.2 pg (ref 26.0–34.0)
MCHC: 30.9 g/dL (ref 30.0–36.0)
MCV: 91.3 fL (ref 80.0–100.0)
Monocytes Absolute: 0.4 10*3/uL (ref 0.1–1.0)
Monocytes Relative: 6 %
Neutro Abs: 4.5 10*3/uL (ref 1.7–7.7)
Neutrophils Relative %: 70 %
Platelets: 234 10*3/uL (ref 150–400)
RBC: 3.09 MIL/uL — ABNORMAL LOW (ref 3.87–5.11)
RDW: 13.7 % (ref 11.5–15.5)
WBC: 6.4 10*3/uL (ref 4.0–10.5)
nRBC: 0 % (ref 0.0–0.2)

## 2023-01-03 LAB — BASIC METABOLIC PANEL
Anion gap: 10 (ref 5–15)
BUN: 60 mg/dL — ABNORMAL HIGH (ref 8–23)
CO2: 20 mmol/L — ABNORMAL LOW (ref 22–32)
Calcium: 8.4 mg/dL — ABNORMAL LOW (ref 8.9–10.3)
Chloride: 108 mmol/L (ref 98–111)
Creatinine, Ser: 4.66 mg/dL — ABNORMAL HIGH (ref 0.44–1.00)
GFR, Estimated: 10 mL/min — ABNORMAL LOW (ref >60)
Glucose, Bld: 121 mg/dL — ABNORMAL HIGH (ref 70–99)
Potassium: 4.7 mmol/L (ref 3.5–5.1)
Sodium: 138 mmol/L (ref 135–145)

## 2023-01-03 MED ORDER — ACETAMINOPHEN 500 MG PO TABS
1000.0000 mg | ORAL_TABLET | Freq: Once | ORAL | Status: AC
  Administered 2023-01-03: 1000 mg via ORAL
  Filled 2023-01-03: qty 2

## 2023-01-03 NOTE — ED Notes (Signed)
Patients BP is about the saame since taking her home meds 30  plus minutes ago

## 2023-01-03 NOTE — ED Provider Notes (Signed)
Teresa Poole Provider Note   CSN: [id number] Arrival date & time: 01/03/23  1012     History  Chief Complaint  Patient presents with   Headache    Teresa Poole is Poole 72 y.o. female.  72 year old female with prior medical history as detailed below presents for evaluation.  Patient reports that she was late for church this morning.  She is kept taking her home medications.  She did take her medications with her discharge in her hand bag.  While at church she developed Poole mild headache.  She did not take anything for her headache.  She decided to come to the ED for evaluation.  On arrival to the ED she is pleasant.  She complains of mild headache.  She is notably hypertensive.  Patient has her home medications with her.  She is advised to take her home medications to treat her blood pressure.  She denies for changes, focal weakness, fever, nausea, vomiting, other complaint.  Of note, yesterday was the patient's birthday.  She is busy regaling the staff with stories of what she did on her birthday weekend.  The history is provided by the patient and medical records.       Home Medications Prior to Admission medications   Medication Sig Start Date End Date Taking? Authorizing Provider  amLODipine (NORVASC) 10 MG tablet Take 1 tablet (10 mg total) by mouth daily. 11/14/22  Yes Regalado, Belkys A, MD  aspirin EC 81 MG tablet Take 1 tablet (81 mg total) by mouth daily. Swallow whole. Patient taking differently: Take 81 mg by mouth at bedtime. Swallow whole. 11/14/22  Yes Regalado, Belkys A, MD  azelastine (ASTELIN) 0.1 % nasal spray Place 2 sprays into both nostrils 2 (two) times daily. 12/02/22  Yes [provider]  carvedilol (COREG) 25 MG tablet Take 1 tablet (25 mg total) by mouth 2 (two) times daily with Poole meal. 11/13/22  Yes Regalado, Belkys A, MD  cetirizine (ZYRTEC) 10 MG tablet Take 10 mg by mouth daily as needed for  allergies. 11/19/22  Yes [provider]  cyanocobalamin 1000 MCG tablet Take 1 tablet (1,000 mcg total) by mouth daily. 11/14/22  Yes Regalado, Belkys A, MD  isosorbide mononitrate (IMDUR) 60 MG 24 hr tablet Take 1 tablet (60 mg total) by mouth daily. 12/14/22  Yes Conte, Tessa N, PA-C  lisinopril (ZESTRIL) 40 MG tablet Take 40 mg by mouth daily. 12/18/22  Yes [provider]  OZEMPIC, 0.25 OR 0.5 MG/DOSE, 2 MG/3ML SOPN Inject 0.25 mg into the skin once Poole week. 12/20/22  Yes [provider]  pantoprazole (PROTONIX) 20 MG tablet Take 1 tablet (20 mg total) by mouth 2 (two) times daily. Patient taking differently: Take 40 mg by mouth every evening. 12/22/22 04/21/23 Yes Dorcas Carrow, MD  QUEtiapine (SEROQUEL) 25 MG tablet Take 1 tablet (25 mg total) by mouth 2 (two) times daily as needed (Agitation). 11/13/22  Yes Regalado, Belkys A, MD  QUEtiapine (SEROQUEL) 50 MG tablet Take 1 tablet (50 mg total) by mouth at bedtime. 11/13/22  Yes Regalado, Belkys A, MD  rosuvastatin (CRESTOR) 40 MG tablet Take 1 tablet (40 mg total) by mouth daily. 12/17/22  Yes Conte, Tessa N, PA-C  sucralfate (CARAFATE) 1 g tablet Take 1 tablet (1 g total) by mouth with breakfast, with lunch, and with evening meal for 7 days. 12/27/22 01/03/23 Yes Teresa Rockers A, DO  thiamine (VITAMIN B-1) 100 MG tablet Take  1 tablet (100 mg total) by mouth daily. 11/14/22  Yes Regalado, Belkys A, MD  CEQUA 0.09 % SOLN Apply 1 drop to eye 2 (two) times daily. Patient not taking: Reported on 01/03/2023 12/30/22   [provider]  docusate sodium (COLACE) 100 MG capsule Take 1 capsule (100 mg total) by mouth 2 (two) times daily. Patient not taking: Reported on 01/03/2023 11/13/22   Regalado, Teresa Billings A, MD  glucose blood (ONE TOUCH ULTRA TEST) test strip Use to check blood sugar 3 times daily Dx code E11.65 Patient taking differently: 1 each by Other route See admin instructions. Use to check blood sugar 3 times daily Dx code  E11.65 11/20/14   Reather Littler, MD  iron polysaccharides (NIFEREX) 150 MG capsule Take 1 capsule (150 mg total) by mouth daily. Patient not taking: Reported on 12/22/2022 11/14/22   Regalado, Teresa Billings A, MD  loratadine (CLARITIN) 10 MG tablet Take 1 tablet (10 mg total) by mouth daily. Patient not taking: Reported on 01/03/2023 12/04/22 01/03/23  Teresa Norfolk, DO      Allergies    Hydralazine hcl and Atorvastatin    Review of Systems   Review of Systems  All other systems reviewed and are negative.   Physical Exam Updated Vital Signs BP (!) 183/69   Pulse 66   Temp 98.4 F (36.9 C)   Resp 16   SpO2 99%  Physical Exam Vitals and nursing note reviewed.  Constitutional:      General: She is not in acute distress.    Appearance: Normal appearance. She is well-developed.  HENT:     Head: Normocephalic and atraumatic.  Eyes:     General: No visual field deficit.    Conjunctiva/sclera: Conjunctivae normal.     Pupils: Pupils are equal, round, and reactive to light.  Cardiovascular:     Rate and Rhythm: Normal rate and regular rhythm.     Heart sounds: Normal heart sounds.  Pulmonary:     Effort: Pulmonary effort is normal. No respiratory distress.     Breath sounds: Normal breath sounds.  Abdominal:     General: There is no distension.     Palpations: Abdomen is soft.     Tenderness: There is no abdominal tenderness.  Musculoskeletal:        General: No deformity. Normal range of motion.     Cervical back: Normal range of motion and neck supple.  Skin:    General: Skin is warm and dry.  Neurological:     General: No focal deficit present.     Mental Status: She is alert and oriented to person, place, and time. Mental status is at baseline.     GCS: GCS eye subscore is 4. GCS verbal subscore is 5. GCS motor subscore is 6.     Cranial Nerves: No cranial nerve deficit, dysarthria or facial asymmetry.     Sensory: No sensory deficit.     Motor: No weakness.     Coordination:  Coordination normal.     ED Results / Procedures / Treatments   Labs (all labs ordered are listed, but only abnormal results are displayed) Labs Reviewed  BASIC METABOLIC PANEL - Abnormal; Notable for the following components:      Result Value   CO2 20 (*)    Glucose, Bld 121 (*)    BUN 60 (*)    Creatinine, Ser 4.66 (*)    Calcium 8.4 (*)    GFR, Estimated 10 (*)    All  other components within normal limits  CBC WITH DIFFERENTIAL/PLATELET - Abnormal; Notable for the following components:   RBC 3.09 (*)    Hemoglobin 8.7 (*)    HCT 28.2 (*)    All other components within normal limits    EKG None  Radiology CT Head Wo Contrast  Result Date: 01/03/2023 CLINICAL DATA:  Headache, new onset. EXAM: CT HEAD WITHOUT CONTRAST TECHNIQUE: Contiguous axial images were obtained from the base of the skull through the vertex without intravenous contrast. RADIATION DOSE REDUCTION: This exam was performed according to the departmental dose-optimization program which includes automated exposure control, adjustment of the mA and/or kV according to patient size and/or use of iterative reconstruction technique. COMPARISON:  11/04/2022 FINDINGS: Brain: No evidence of acute infarction, hemorrhage, hydrocephalus, extra-axial collection or mass lesion/mass effect. Vascular: No hyperdense vessel or unexpected calcification. Skull: Normal. Negative for fracture or focal lesion. Sinuses/Orbits: No acute finding. IMPRESSION: No acute finding or explanation for headache. Electronically Signed   By: Tiburcio Pea M.D.   On: 01/03/2023 12:58    Procedures Procedures    Medications Ordered in ED Medications  acetaminophen (TYLENOL) tablet 1,000 mg (1,000 mg Oral Given 01/03/23 1309)    ED Course/ Medical Decision Making/ Poole&P                             Medical Decision Making Amount and/or Complexity of Data Reviewed Labs: ordered. Radiology: ordered.  Risk OTC drugs.    Medical Screen  Complete  This patient presented to the ED with complaint of headache.  This complaint involves an extensive number of treatment options. The initial differential diagnosis includes, but is not limited to, headache, uncontrolled hypertension, metabolic abnormality, endorgan damage related to hypertension.  This presentation is: Acute, Chronic, Self-Limited, Previously Undiagnosed, Uncertain Prognosis, Complicated, Systemic Symptoms, and Threat to Life/Bodily Function  Presents with mild headache.  Patient apparently skipped taking her Poole.m. doses of medications because she was late for church.  Subsequently she developed Poole mild headache.  On initial evaluation in the ED she is mildly hypertensive.  She is neurologically intact.  After taking her regular Poole.m. medications including her antihypertensives her blood pressure improved.  Her headache resolved.  Screening labs and imaging obtained is without significant acute abnormality.  Patient feels significantly improved after ED evaluation.  She desires discharge home.  She does understand need for close outpatient follow-up.  Patient is repeatedly instructed to take her medications as she is supposed to.  Additional history obtained:  External records from outside sources obtained and reviewed including prior ED visits and prior Inpatient records.    Lab Tests:  I ordered and personally interpreted labs.  The pertinent results include: CBC, BMP   Imaging Studies ordered:  I ordered imaging studies including CT head I independently visualized and interpreted obtained imaging which showed NAD I agree with the radiologist interpretation.  Problem List / ED Course:  Headache, hypertension   Reevaluation:  After the interventions noted above, I reevaluated the patient and found that they have: improved   Disposition:  After consideration of the diagnostic results and the patients response to treatment, I feel that the patent  would benefit from close outpatient follow-up.          Final Clinical Impression(s) / ED Diagnoses Final diagnoses:  Acute nonintractable headache, unspecified headache type  Hypertension, unspecified type    Rx / DC Orders ED Discharge Orders  None         Wynetta Fines, MD 01/03/23 1455

## 2023-01-03 NOTE — ED Notes (Signed)
Patient has her home meds   Dr Rodena Medin said for to take them

## 2023-01-03 NOTE — ED Triage Notes (Signed)
Pt arrived via POV, c/o headache that started this morning. Denies any vision changes. Hypertensive in triage, hx of HTN and took medications this morning.

## 2023-01-03 NOTE — Discharge Instructions (Signed)
Return for any problem.  Take your medications as prescribed.

## 2023-01-06 ENCOUNTER — Ambulatory Visit: Payer: Medicare PPO | Attending: Cardiovascular Disease | Admitting: Pharmacist

## 2023-01-06 VITALS — BP 196/68 | HR 72

## 2023-01-06 DIAGNOSIS — I10 Essential (primary) hypertension: Secondary | ICD-10-CM | POA: Diagnosis not present

## 2023-01-06 MED ORDER — AMLODIPINE BESYLATE 10 MG PO TABS: 10.0000 mg | ORAL_TABLET | Freq: Every day | ORAL | 3 refills | Status: DC

## 2023-01-06 MED ORDER — LISINOPRIL 40 MG PO TABS: 40.0000 mg | ORAL_TABLET | Freq: Every day | ORAL | 3 refills | Status: DC

## 2023-01-06 MED ORDER — CARVEDILOL 25 MG PO TABS: 25.0000 mg | ORAL_TABLET | Freq: Two times a day (BID) | ORAL | 3 refills | Status: DC

## 2023-01-06 NOTE — Progress Notes (Signed)
Patient ID: Teresa Poole                 DOB: 21-Jun-1951                      MRN: [id number]     HPI: Teresa Poole is a 72 y.o. female patient of Dr Eldridge Dace referred by Jari Favre, PA to HTN clinic. PMH is significant for carotid stenosis, chronic combined systolic and diastolic CHF with most recent EF 45-50% on 10/2022 echo, CKD stage IV following with Dr Verna Czech, renal artery stenosis s/p stent 09/2018, DM2, and OSA. Last saw Tessa on 12/14/22, don't see BP entered from that visit but note mentions that systolic was in the 200s and pt had not taken her medications, by end of visit BP improved to 185/80. Pt did not want to start new medication so her Imdur dose was increased for her BP. Has had 15 ED visits so far this year. Most recently on 01/03/23 for a mild headache. Her BP was 215/96, she had skipped her morning BP medications. Her headache resolved once she took her medications.  Pt presents today for follow up. Hasn't taken her morning BP meds. Reports she usually takes these 9-10am and usually brings them out with her to take rather than taking them at home. Is frustrated she forgot to bring them with her to her visit today. States she takes her medication because she knows the importance of it as multiple family members have died from high BP. Not clear what she is taking at home. States if she has a current prescription and medication at home then she takes it, however isn't 100% sure when I go through her HTN meds in detail. Does not think she is taking carvedilol twice a day. Has a BP cuff at home but isn't sure where it is and hasn't checked BP in a while but knows it's high which makes her sad. Denies headache, blurry vision, and chest pain. Asked about her headache from a few days ago when she was in the ER with headache that improved after taking her BP meds. She states that headache was from pollen and that she hadn't missed her HTN meds that day. Does not exercise. Chronic  swelling in her legs. Diet is very high in sodium. Reports she hasn't cooked in 15 years, has an empty fridge, and goes out to eat 100% of the time. Frequently eats fast food. Not on CPAP for her OSA. Follows with [facility] but isn't sure when her next appt is, thinks either 6/5 or 6/6.  Current HTN meds: amlodipine 10mg  daily, carvedilol 25mg  BID, Imdur 60mg  daily, lisinopril 40mg  daily  Previously tried: hydralazine - burning sensation   BP goal: <130/76mmHg  Family History: Father with DM and HTN.  Social History: No tobacco, alcohol or drug use.  Diet: No caffeine, doesn't use salt shaker. Doesn't cook but eats fast food all the time - [employer], [employer], [employer] Slicker, other restaurants. Likes chicken and [employer], [employer]. Doesn't cook, has an empty fridge at home. "Hasn't cooked in 15 years."  Exercise: Minimal  Wt Readings from Last 3 Encounters:  12/26/22 158 lb 11.7 oz (72 kg)  12/22/22 159 lb 2.8 oz (72.2 kg)  12/14/22 158 lb (71.7 kg)   BP Readings from Last 3 Encounters:  01/03/23 (!) 183/69  12/27/22 (!) 115/59  12/22/22 (!) 147/80   Pulse Readings from Last 3 Encounters:  01/03/23 66  12/27/22 68  12/22/22 69    Renal function: Estimated Creatinine Clearance: 10.8 mL/min (A) (by C-G formula based on SCr of 4.66 mg/dL (H)).  Past Medical History:  Diagnosis Date   Allergy    Anemia    Carotid stenosis    Chronic combined systolic and diastolic CHF (congestive heart failure) (HCC)    CKD (chronic kidney disease), stage IV (HCC) 09/24/2013   Pt at [facility], Dr. Marisue Humble   COVID-19 10/24/2021   Diabetes mellitus type 2 in nonobese [facility])    Edema 09/14/2013   GERD (gastroesophageal reflux disease)    History of ovarian cancer    Hyperlipidemia    Hypertension    Mild CAD    non-obstructive by LHC (09/25/2013): Proximal and mid LAD serial 20%, proximal circumflex 30%, mid AV groove circumflex 30%, mid RCA mild plaque.   NICM (nonischemic cardiomyopathy)  (HCC)    Obesity (BMI 30-39.9)    RBBB    Renal artery stenosis (HCC) 10/11/2018   Thyroid disease    Seen by specialist    Current Outpatient Medications on File Prior to Visit  Medication Sig Dispense Refill   amLODipine (NORVASC) 10 MG tablet Take 1 tablet (10 mg total) by mouth daily. 30 tablet 3   aspirin EC 81 MG tablet Take 1 tablet (81 mg total) by mouth daily. Swallow whole. (Patient taking differently: Take 81 mg by mouth at bedtime. Swallow whole.) 30 tablet 12   azelastine (ASTELIN) 0.1 % nasal spray Place 2 sprays into both nostrils 2 (two) times daily.     carvedilol (COREG) 25 MG tablet Take 1 tablet (25 mg total) by mouth 2 (two) times daily with a meal. 60 tablet 1   CEQUA 0.09 % SOLN Apply 1 drop to eye 2 (two) times daily. (Patient not taking: Reported on 01/03/2023)     cetirizine (ZYRTEC) 10 MG tablet Take 10 mg by mouth daily as needed for allergies.     cyanocobalamin 1000 MCG tablet Take 1 tablet (1,000 mcg total) by mouth daily. 30 tablet 0   docusate sodium (COLACE) 100 MG capsule Take 1 capsule (100 mg total) by mouth 2 (two) times daily. (Patient not taking: Reported on 01/03/2023) 10 capsule 0   glucose blood (ONE TOUCH ULTRA TEST) test strip Use to check blood sugar 3 times daily Dx code E11.65 (Patient taking differently: 1 each by Other route See admin instructions. Use to check blood sugar 3 times daily Dx code E11.65) 100 each 3   iron polysaccharides (NIFEREX) 150 MG capsule Take 1 capsule (150 mg total) by mouth daily. (Patient not taking: Reported on 12/22/2022) 30 capsule 0   isosorbide mononitrate (IMDUR) 60 MG 24 hr tablet Take 1 tablet (60 mg total) by mouth daily. 90 tablet 3   lisinopril (ZESTRIL) 40 MG tablet Take 40 mg by mouth daily.     loratadine (CLARITIN) 10 MG tablet Take 1 tablet (10 mg total) by mouth daily. (Patient not taking: Reported on 01/03/2023) 30 tablet 0   OZEMPIC, 0.25 OR 0.5 MG/DOSE, 2 MG/3ML SOPN Inject 0.25 mg into the skin once a  week.     pantoprazole (PROTONIX) 20 MG tablet Take 1 tablet (20 mg total) by mouth 2 (two) times daily. (Patient taking differently: Take 40 mg by mouth every evening.) 60 tablet 3   QUEtiapine (SEROQUEL) 25 MG tablet Take 1 tablet (25 mg total) by mouth 2 (two) times daily as needed (Agitation). 30 tablet 0   QUEtiapine (SEROQUEL) 50 MG tablet  Take 1 tablet (50 mg total) by mouth at bedtime. 30 tablet 1   rosuvastatin (CRESTOR) 40 MG tablet Take 1 tablet (40 mg total) by mouth daily. 90 tablet 3   sucralfate (CARAFATE) 1 g tablet Take 1 tablet (1 g total) by mouth with breakfast, with lunch, and with evening meal for 7 days. 21 tablet 0   thiamine (VITAMIN B-1) 100 MG tablet Take 1 tablet (100 mg total) by mouth daily. 30 tablet 0   No current facility-administered medications on file prior to visit.    Allergies  Allergen Reactions   Hydralazine Hcl Itching    ENTIRE BODY = burning sensation, also in the eyes (they have become very sensitive to light)   Atorvastatin Other (See Comments)    Per MD - pt not sure of reaction      Assessment/Plan:  1. Hypertension - BP very elevated at visit today, far above goal < 130/13mmHg. Pt has not taken any of her BP medications today. Not clear what she is taking at home either. Diet is very high in sodium as she does not cook, only eats out, and goes for fast food frequently. No exercise either. Advised pt to look for healthier snack options that are lower in sodium with goal of < 2,000mg  day and to increase walking. Advised her to start taking her carvedilol twice daily (sounds like she is only taking once daily), and to monitor/record her BP and bring in her log, cuff, and medication to follow up visit with me in 3 weeks. Medication options are limited as her amlodipine, carvedilol, and lisinopril doses are already maxed out, she has an allergy to hydralazine, and cannot use thiazide or MRA due to her stage 4 CKD. Only other option would be clonidine  which is not ideal for multiple reasons (recent ED visit with severe HTN due to taking BP meds late, late taking meds today - risk of rebound HTN with late doses of clonidine, also not sure that HR would allow clonidine since HR is ~70 today and it sounds like she's only been taking her carvedilol once daily).  Sharalee Witman E. Colden Samaras, PharmD, BCACP, CPP [facility] [address], [place], [place] [address] Phone: [phone]; Fax: [phone] 01/06/2023 11:06 AM

## 2023-01-06 NOTE — Patient Instructions (Addendum)
Your blood pressure goal is < 130/5mmHg  Look for lower sodium snacks like fruits, vegetables, yogurt, salt-free nuts, granola bars, etc  Decrease fast food  Increase walking  Make sure your carvedilol medication is being taken twice daily, about 12 hours apart  Look for a blood pressure cuff and start monitoring your readings 1-2 hours after taking your medication  Bring in your cuff, readings, and medication to your next visit  Take your medication 1-2 hours before your appointment   Important lifestyle changes to control high blood pressure  Intervention  Effect on the BP   Weight loss Weight loss is one of the most effective lifestyle changes for controlling blood pressure. If you're overweight or obese, losing even a small amount of weight can help reduce blood pressure.    Blood pressure can decrease by 1 millimeter of mercury (mmHg) with each kilogram (about 2.2 pounds) of weight lost.   Exercise regularly As a general goal, aim for 30 minutes of moderate physical activity every day.    Regular physical activity can lower blood pressure by 5 - 8 mmHg.   Eat a healthy diet Eat a diet rich in whole grains, fruits, vegetables, lean meat, and low-fat dairy products. Limit processed foods, saturated fat, and sweets.    A heart-healthy diet can lower high blood pressure by 10 mmHg.   Reduce salt (sodium) in your diet Aim for 000mg  of sodium each day. Avoid deli meats, canned food, and frozen microwave meals which are high in sodium.     Limiting sodium can reduce blood pressure by 5 mmHg.   Limit alcohol One drink equals 12 ounces of beer, 5 ounces of wine, or 1.5 ounces of 80-proof liquor.    Limiting alcohol to < 1 drink a day for women or < 2 drinks a day for men can help lower blood pressure by about 4 mmHg.   To check your pressure at home you will need to:   Sit up in a chair, with feet flat on the floor and back supported. Do not cross your ankles or  legs. Rest your left arm so that the cuff is about heart level. If the cuff goes on your upper arm, then just relax your arm on the table, arm of the chair, or your lap. If you have a wrist cuff, hold your wrist against your chest at heart level. Place the cuff snugly around your arm, about 1 inch above the crease of your elbow. The cords should be inside the groove of your elbow.  Sit quietly, with the cuff in place, for about 5 minutes. Then press the power button to start a reading. Do not talk or move while the reading is taking place.  Record your readings on a sheet of paper. Although most cuffs have a memory, it is often easier to see a pattern developing when the numbers are all in front of you.  You can repeat the reading after 1-3 minutes if it is recommended.   Make sure your bladder is empty and you have not had caffeine or tobacco within the last 30 minutes   Always bring your blood pressure log with you to your appointments. If you have not brought your monitor in to be double checked for accuracy, please bring it to your next appointment.   You can find a list of validated (accurate) blood pressure cuffs at: validatebp.org

## 2023-01-21 ENCOUNTER — Inpatient Hospital Stay (HOSPITAL_COMMUNITY)
Admission: EM | Admit: 2023-01-21 | Discharge: 2023-01-29 | DRG: 291 | Disposition: A | Discharge status: Hospice | Payer: Medicare PPO | Attending: Internal Medicine | Admitting: Internal Medicine

## 2023-01-21 ENCOUNTER — Emergency Department (HOSPITAL_COMMUNITY): Payer: Medicare PPO

## 2023-01-21 ENCOUNTER — Encounter (HOSPITAL_COMMUNITY): Payer: Self-pay

## 2023-01-21 ENCOUNTER — Other Ambulatory Visit: Payer: Self-pay

## 2023-01-21 DIAGNOSIS — N184 Chronic kidney disease, stage 4 (severe): Secondary | ICD-10-CM | POA: Diagnosis present

## 2023-01-21 DIAGNOSIS — Z8616 Personal history of COVID-19: Secondary | ICD-10-CM

## 2023-01-21 DIAGNOSIS — Z7985 Long-term (current) use of injectable non-insulin antidiabetic drugs: Secondary | ICD-10-CM

## 2023-01-21 DIAGNOSIS — I5043 Acute on chronic combined systolic (congestive) and diastolic (congestive) heart failure: Secondary | ICD-10-CM | POA: Diagnosis present

## 2023-01-21 DIAGNOSIS — G309 Alzheimer's disease, unspecified: Secondary | ICD-10-CM | POA: Diagnosis present

## 2023-01-21 DIAGNOSIS — Z833 Family history of diabetes mellitus: Secondary | ICD-10-CM

## 2023-01-21 DIAGNOSIS — I4510 Unspecified right bundle-branch block: Secondary | ICD-10-CM | POA: Diagnosis present

## 2023-01-21 DIAGNOSIS — E875 Hyperkalemia: Secondary | ICD-10-CM | POA: Diagnosis present

## 2023-01-21 DIAGNOSIS — Z7982 Long term (current) use of aspirin: Secondary | ICD-10-CM

## 2023-01-21 DIAGNOSIS — N179 Acute kidney failure, unspecified: Secondary | ICD-10-CM | POA: Diagnosis present

## 2023-01-21 DIAGNOSIS — E079 Disorder of thyroid, unspecified: Secondary | ICD-10-CM | POA: Diagnosis present

## 2023-01-21 DIAGNOSIS — D631 Anemia in chronic kidney disease: Secondary | ICD-10-CM | POA: Diagnosis present

## 2023-01-21 DIAGNOSIS — D649 Anemia, unspecified: Secondary | ICD-10-CM | POA: Diagnosis present

## 2023-01-21 DIAGNOSIS — N185 Chronic kidney disease, stage 5: Secondary | ICD-10-CM | POA: Diagnosis present

## 2023-01-21 DIAGNOSIS — Z8543 Personal history of malignant neoplasm of ovary: Secondary | ICD-10-CM

## 2023-01-21 DIAGNOSIS — I2510 Atherosclerotic heart disease of native coronary artery without angina pectoris: Secondary | ICD-10-CM | POA: Diagnosis present

## 2023-01-21 DIAGNOSIS — D509 Iron deficiency anemia, unspecified: Secondary | ICD-10-CM | POA: Diagnosis present

## 2023-01-21 DIAGNOSIS — I132 Hypertensive heart and chronic kidney disease with heart failure and with stage 5 chronic kidney disease, or end stage renal disease: Principal | ICD-10-CM | POA: Diagnosis present

## 2023-01-21 DIAGNOSIS — Z8249 Family history of ischemic heart disease and other diseases of the circulatory system: Secondary | ICD-10-CM

## 2023-01-21 DIAGNOSIS — Z79899 Other long term (current) drug therapy: Secondary | ICD-10-CM

## 2023-01-21 DIAGNOSIS — Z91148 Patient's other noncompliance with medication regimen for other reason: Secondary | ICD-10-CM

## 2023-01-21 DIAGNOSIS — I428 Other cardiomyopathies: Secondary | ICD-10-CM | POA: Diagnosis present

## 2023-01-21 DIAGNOSIS — I509 Heart failure, unspecified: Secondary | ICD-10-CM | POA: Diagnosis not present

## 2023-01-21 DIAGNOSIS — Z5900 Homelessness unspecified: Secondary | ICD-10-CM

## 2023-01-21 DIAGNOSIS — Z781 Physical restraint status: Secondary | ICD-10-CM

## 2023-01-21 DIAGNOSIS — E785 Hyperlipidemia, unspecified: Secondary | ICD-10-CM | POA: Diagnosis present

## 2023-01-21 DIAGNOSIS — E1122 Type 2 diabetes mellitus with diabetic chronic kidney disease: Secondary | ICD-10-CM | POA: Diagnosis present

## 2023-01-21 DIAGNOSIS — I10 Essential (primary) hypertension: Secondary | ICD-10-CM | POA: Diagnosis present

## 2023-01-21 DIAGNOSIS — Z66 Do not resuscitate: Secondary | ICD-10-CM | POA: Diagnosis present

## 2023-01-21 DIAGNOSIS — F0280 Dementia in other diseases classified elsewhere without behavioral disturbance: Secondary | ICD-10-CM | POA: Diagnosis present

## 2023-01-21 DIAGNOSIS — G3184 Mild cognitive impairment, so stated: Secondary | ICD-10-CM | POA: Diagnosis present

## 2023-01-21 DIAGNOSIS — F05 Delirium due to known physiological condition: Secondary | ICD-10-CM | POA: Diagnosis present

## 2023-01-21 DIAGNOSIS — K219 Gastro-esophageal reflux disease without esophagitis: Secondary | ICD-10-CM | POA: Diagnosis present

## 2023-01-21 LAB — COMPREHENSIVE METABOLIC PANEL
ALT: 13 U/L (ref 0–44)
AST: 16 U/L (ref 15–41)
Albumin: 2.8 g/dL — ABNORMAL LOW (ref 3.5–5.0)
Alkaline Phosphatase: 52 U/L (ref 38–126)
Anion gap: 9 (ref 5–15)
BUN: 54 mg/dL — ABNORMAL HIGH (ref 8–23)
CO2: 21 mmol/L — ABNORMAL LOW (ref 22–32)
Calcium: 8.5 mg/dL — ABNORMAL LOW (ref 8.9–10.3)
Chloride: 112 mmol/L — ABNORMAL HIGH (ref 98–111)
Creatinine, Ser: 5.41 mg/dL — ABNORMAL HIGH (ref 0.44–1.00)
GFR, Estimated: 8 mL/min — ABNORMAL LOW (ref >60)
Glucose, Bld: 164 mg/dL — ABNORMAL HIGH (ref 70–99)
Potassium: 5.3 mmol/L — ABNORMAL HIGH (ref 3.5–5.1)
Sodium: 142 mmol/L (ref 135–145)
Total Bilirubin: 0.5 mg/dL (ref 0.3–1.2)
Total Protein: 5.7 g/dL — ABNORMAL LOW (ref 6.5–8.1)

## 2023-01-21 LAB — CBC
HCT: 23 % — ABNORMAL LOW (ref 36.0–46.0)
Hemoglobin: 7.1 g/dL — ABNORMAL LOW (ref 12.0–15.0)
MCH: 28.1 pg (ref 26.0–34.0)
MCHC: 30.9 g/dL (ref 30.0–36.0)
MCV: 90.9 fL (ref 80.0–100.0)
Platelets: 212 10*3/uL (ref 150–400)
RBC: 2.53 MIL/uL — ABNORMAL LOW (ref 3.87–5.11)
RDW: 13.9 % (ref 11.5–15.5)
WBC: 5.3 10*3/uL (ref 4.0–10.5)
nRBC: 0 % (ref 0.0–0.2)

## 2023-01-21 LAB — BRAIN NATRIURETIC PEPTIDE: B Natriuretic Peptide: 350.8 pg/mL — ABNORMAL HIGH (ref 0.0–100.0)

## 2023-01-21 LAB — TROPONIN I (HIGH SENSITIVITY)
Troponin I (High Sensitivity): 16 ng/L (ref <18)
Troponin I (High Sensitivity): 17 ng/L (ref <18)

## 2023-01-21 LAB — BPAM RBC

## 2023-01-21 LAB — PREPARE RBC (CROSSMATCH)

## 2023-01-21 LAB — BASIC METABOLIC PANEL
Anion gap: 12 (ref 5–15)
BUN: 55 mg/dL — ABNORMAL HIGH (ref 8–23)
CO2: 19 mmol/L — ABNORMAL LOW (ref 22–32)
Calcium: 8.7 mg/dL — ABNORMAL LOW (ref 8.9–10.3)
Chloride: 110 mmol/L (ref 98–111)
Creatinine, Ser: 5.25 mg/dL — ABNORMAL HIGH (ref 0.44–1.00)
GFR, Estimated: 8 mL/min — ABNORMAL LOW (ref >60)
Glucose, Bld: 137 mg/dL — ABNORMAL HIGH (ref 70–99)
Potassium: 5.2 mmol/L — ABNORMAL HIGH (ref 3.5–5.1)
Sodium: 141 mmol/L (ref 135–145)

## 2023-01-21 LAB — GLUCOSE, CAPILLARY: Glucose-Capillary: 148 mg/dL — ABNORMAL HIGH (ref 70–99)

## 2023-01-21 LAB — IRON AND TIBC
Iron: 42 ug/dL (ref 28–170)
Saturation Ratios: 12 % (ref 10.4–31.8)
TIBC: 361 ug/dL (ref 250–450)
UIBC: 319 ug/dL

## 2023-01-21 LAB — TYPE AND SCREEN: Unit division: 0

## 2023-01-21 LAB — MAGNESIUM: Magnesium: 1.8 mg/dL (ref 1.7–2.4)

## 2023-01-21 LAB — RETICULOCYTES
Immature Retic Fract: 21.6 % — ABNORMAL HIGH (ref 2.3–15.9)
RBC.: 2.77 MIL/uL — ABNORMAL LOW (ref 3.87–5.11)
Retic Count, Absolute: 41.8 10*3/uL (ref 19.0–186.0)
Retic Ct Pct: 1.5 % (ref 0.4–3.1)

## 2023-01-21 LAB — FERRITIN: Ferritin: 16 ng/mL (ref 11–307)

## 2023-01-21 LAB — FOLATE: Folate: 18 ng/mL (ref >5.9)

## 2023-01-21 LAB — VITAMIN B12: Vitamin B-12: 471 pg/mL (ref 180–914)

## 2023-01-21 LAB — POC OCCULT BLOOD, ED: Fecal Occult Bld: NEGATIVE

## 2023-01-21 MED ORDER — FUROSEMIDE 10 MG/ML IJ SOLN: 80.0000 mg | Freq: Once | INTRAMUSCULAR | Status: DC

## 2023-01-21 MED ORDER — FUROSEMIDE 10 MG/ML IJ SOLN
80.0000 mg | Freq: Two times a day (BID) | INTRAMUSCULAR | Status: DC
  Administered 2023-01-21: 40 mg via INTRAVENOUS
  Administered 2023-01-22 – 2023-01-23 (×3): 80 mg via INTRAVENOUS
  Filled 2023-01-21 (×4): qty 8

## 2023-01-21 MED ORDER — CARVEDILOL 25 MG PO TABS
25.0000 mg | ORAL_TABLET | Freq: Two times a day (BID) | ORAL | Status: DC
  Administered 2023-01-21 – 2023-01-24 (×7): 25 mg via ORAL
  Filled 2023-01-21 (×7): qty 1

## 2023-01-21 MED ORDER — INSULIN ASPART 100 UNIT/ML IJ SOLN
0.0000 [IU] | Freq: Three times a day (TID) | INTRAMUSCULAR | Status: DC
  Administered 2023-01-22: 2 [IU] via SUBCUTANEOUS
  Administered 2023-01-22: 1 [IU] via SUBCUTANEOUS
  Administered 2023-01-23: 2 [IU] via SUBCUTANEOUS
  Administered 2023-01-24 (×2): 1 [IU] via SUBCUTANEOUS
  Administered 2023-01-25: 2 [IU] via SUBCUTANEOUS
  Administered 2023-01-25: 3 [IU] via SUBCUTANEOUS
  Administered 2023-01-26: 2 [IU] via SUBCUTANEOUS
  Administered 2023-01-27 – 2023-01-28 (×2): 3 [IU] via SUBCUTANEOUS

## 2023-01-21 MED ORDER — FUROSEMIDE 10 MG/ML IJ SOLN
40.0000 mg | Freq: Once | INTRAMUSCULAR | Status: AC
  Administered 2023-01-21: 40 mg via INTRAVENOUS
  Filled 2023-01-21: qty 4

## 2023-01-21 MED ORDER — HEPARIN SODIUM (PORCINE) 5000 UNIT/ML IJ SOLN
5000.0000 [IU] | Freq: Three times a day (TID) | INTRAMUSCULAR | Status: DC
  Administered 2023-01-21: 5000 [IU] via SUBCUTANEOUS
  Filled 2023-01-21: qty 1

## 2023-01-21 MED ORDER — SODIUM ZIRCONIUM CYCLOSILICATE 10 G PO PACK
10.0000 g | PACK | Freq: Once | ORAL | Status: AC
  Administered 2023-01-21: 10 g via ORAL
  Filled 2023-01-21: qty 1

## 2023-01-21 MED ORDER — QUETIAPINE FUMARATE 25 MG PO TABS
50.0000 mg | ORAL_TABLET | Freq: Every day | ORAL | Status: DC
  Administered 2023-01-21 – 2023-01-26 (×6): 50 mg via ORAL
  Filled 2023-01-21 (×3): qty 1
  Filled 2023-01-21: qty 2
  Filled 2023-01-21 (×2): qty 1

## 2023-01-21 MED ORDER — QUETIAPINE FUMARATE 25 MG PO TABS: 25.0000 mg | ORAL_TABLET | Freq: Two times a day (BID) | ORAL | Status: DC | PRN

## 2023-01-21 MED ORDER — AMLODIPINE BESYLATE 10 MG PO TABS
10.0000 mg | ORAL_TABLET | Freq: Every day | ORAL | Status: DC
  Administered 2023-01-21 – 2023-01-28 (×8): 10 mg via ORAL
  Filled 2023-01-21 (×9): qty 1

## 2023-01-21 MED ORDER — HEPARIN SODIUM (PORCINE) 5000 UNIT/ML IJ SOLN
5000.0000 [IU] | Freq: Three times a day (TID) | INTRAMUSCULAR | Status: DC
  Administered 2023-01-22 – 2023-01-29 (×8): 5000 [IU] via SUBCUTANEOUS
  Filled 2023-01-21 (×11): qty 1

## 2023-01-21 MED ORDER — ROSUVASTATIN CALCIUM 20 MG PO TABS
40.0000 mg | ORAL_TABLET | Freq: Every day | ORAL | Status: DC
  Administered 2023-01-22 – 2023-01-28 (×6): 40 mg via ORAL
  Filled 2023-01-21 (×7): qty 2

## 2023-01-21 MED ORDER — SODIUM CHLORIDE 0.9% IV SOLUTION: Freq: Once | INTRAVENOUS | Status: DC

## 2023-01-21 MED ORDER — THIAMINE MONONITRATE 100 MG PO TABS
100.0000 mg | ORAL_TABLET | Freq: Every day | ORAL | Status: DC
  Administered 2023-01-21 – 2023-01-28 (×7): 100 mg via ORAL
  Filled 2023-01-21 (×8): qty 1

## 2023-01-21 MED ORDER — ISOSORBIDE MONONITRATE ER 30 MG PO TB24
60.0000 mg | ORAL_TABLET | Freq: Every day | ORAL | Status: DC
  Administered 2023-01-22 – 2023-01-24 (×3): 60 mg via ORAL
  Filled 2023-01-21 (×4): qty 2

## 2023-01-21 MED ORDER — PANTOPRAZOLE SODIUM 20 MG PO TBEC
20.0000 mg | DELAYED_RELEASE_TABLET | Freq: Two times a day (BID) | ORAL | Status: DC
  Administered 2023-01-21 – 2023-01-28 (×12): 20 mg via ORAL
  Filled 2023-01-21 (×16): qty 1

## 2023-01-21 MED ORDER — DOCUSATE SODIUM 100 MG PO CAPS
100.0000 mg | ORAL_CAPSULE | Freq: Two times a day (BID) | ORAL | Status: DC
  Administered 2023-01-21 – 2023-01-28 (×11): 100 mg via ORAL
  Filled 2023-01-21 (×13): qty 1

## 2023-01-21 MED ORDER — LORAZEPAM 2 MG/ML IJ SOLN: 1.0000 mg | Freq: Once | INTRAMUSCULAR | Status: DC | PRN

## 2023-01-21 MED ORDER — SODIUM CHLORIDE 0.9 % IV SOLN
250.0000 mg | Freq: Every day | INTRAVENOUS | Status: AC
  Administered 2023-01-21 – 2023-01-24 (×4): 250 mg via INTRAVENOUS
  Filled 2023-01-21: qty 20
  Filled 2023-01-21: qty 250
  Filled 2023-01-21 (×2): qty 20

## 2023-01-21 MED ORDER — ASPIRIN 81 MG PO TBEC: 81.0000 mg | DELAYED_RELEASE_TABLET | Freq: Every day | ORAL | Status: DC

## 2023-01-21 MED ORDER — QUETIAPINE FUMARATE 25 MG PO TABS: 25.0000 mg | ORAL_TABLET | Freq: Once | ORAL | Status: DC | PRN

## 2023-01-21 NOTE — ED Provider Notes (Signed)
[place] EMERGENCY DEPARTMENT AT [facility] Provider Note   CSN: [id number] Arrival date & time: 01/21/23  1157     History  Chief Complaint  Patient presents with   abnormal labs   HPI LAJAYLA ZACKERY is a 72 y.o. female with H/o hypertensive cardiomyopathy, diabetes type 2, CAD, RBBB, CKD and CHF abnormal labs.as seen by her PCP earlier today for her allergies and basic labs revealed that she had a hemoglobin of 7.  Patient mentioned that she has been short of breath and fatigue for the last 4 weeks.  Also feels like "a 10 pound bag of potatoes sitting on her chest".  Denies hematuria, bloody stools, or hematemesis.  Has history of anemia and takes iron supplements.  Is on a daily aspirin.  Also mention that on her drive here to the ED, she noticed that both legs were more swollen than usual.  HPI     Home Medications Prior to Admission medications   Medication Sig Start Date End Date Taking? Authorizing Provider  amLODipine (NORVASC) 10 MG tablet Take 1 tablet (10 mg total) by mouth daily. 01/06/23   Corky Crafts, MD  aspirin EC 81 MG tablet Take 1 tablet (81 mg total) by mouth daily. Swallow whole. Patient taking differently: Take 81 mg by mouth at bedtime. Swallow whole. 11/14/22   Regalado, Belkys A, MD  azelastine (ASTELIN) 0.1 % nasal spray Place 2 sprays into both nostrils 2 (two) times daily. 12/02/22   [provider]  carvedilol (COREG) 25 MG tablet Take 1 tablet (25 mg total) by mouth 2 (two) times daily with a meal. 01/06/23   Corky Crafts, MD  CEQUA 0.09 % SOLN Apply 1 drop to eye 2 (two) times daily. Patient not taking: Reported on 01/03/2023 12/30/22   [provider]  cetirizine (ZYRTEC) 10 MG tablet Take 10 mg by mouth daily as needed for allergies. 11/19/22   [provider]  cyanocobalamin 1000 MCG tablet Take 1 tablet (1,000 mcg total) by mouth daily. 11/14/22   Regalado, Belkys A, MD  docusate sodium (COLACE)  100 MG capsule Take 1 capsule (100 mg total) by mouth 2 (two) times daily. Patient not taking: Reported on 01/03/2023 11/13/22   Regalado, Jon Billings A, MD  glucose blood (ONE TOUCH ULTRA TEST) test strip Use to check blood sugar 3 times daily Dx code E11.65 Patient taking differently: 1 each by Other route See admin instructions. Use to check blood sugar 3 times daily Dx code E11.65 11/20/14   Reather Littler, MD  iron polysaccharides (NIFEREX) 150 MG capsule Take 1 capsule (150 mg total) by mouth daily. Patient not taking: Reported on 12/22/2022 11/14/22   Hartley Barefoot A, MD  isosorbide mononitrate (IMDUR) 60 MG 24 hr tablet Take 1 tablet (60 mg total) by mouth daily. 12/14/22   Sharlene Dory, PA-C  lisinopril (ZESTRIL) 40 MG tablet Take 1 tablet (40 mg total) by mouth daily. 01/06/23   Corky Crafts, MD  loratadine (CLARITIN) 10 MG tablet Take 1 tablet (10 mg total) by mouth daily. Patient not taking: Reported on 01/03/2023 12/04/22 01/03/23  Curatolo, Adam, DO  OZEMPIC, 0.25 OR 0.5 MG/DOSE, 2 MG/3ML SOPN Inject 0.25 mg into the skin once a week. 12/20/22   [provider]  pantoprazole (PROTONIX) 20 MG tablet Take 1 tablet (20 mg total) by mouth 2 (two) times daily. Patient taking differently: Take 40 mg by mouth every evening. 12/22/22 04/21/23  Dorcas Carrow, MD  QUEtiapine (SEROQUEL) 25 MG tablet Take 1 tablet (25 mg total) by mouth 2 (two) times daily as needed (Agitation). 11/13/22   Regalado, Belkys A, MD  QUEtiapine (SEROQUEL) 50 MG tablet Take 1 tablet (50 mg total) by mouth at bedtime. 11/13/22   Regalado, Belkys A, MD  rosuvastatin (CRESTOR) 40 MG tablet Take 1 tablet (40 mg total) by mouth daily. 12/17/22   Sharlene Dory, PA-C  sucralfate (CARAFATE) 1 g tablet Take 1 tablet (1 g total) by mouth with breakfast, with lunch, and with evening meal for 7 days. 12/27/22 01/03/23  Tanda Rockers A, DO  thiamine (VITAMIN B-1) 100 MG tablet Take 1 tablet (100 mg total) by mouth daily. 11/14/22    Regalado, Jon Billings A, MD      Allergies    Hydralazine hcl and Atorvastatin    Review of Systems   Review of Systems  Respiratory:  Positive for shortness of breath.     Physical Exam   Vitals:   01/21/23 1638 01/21/23 1714  BP:  (!) 159/77  Pulse:  71  Resp:  17  Temp: 98.6 F (37 C)   SpO2:  99%    CONSTITUTIONAL:  well-appearing, NAD NEURO:  Alert and oriented x 3, CN 3-12 grossly intact EYES:  eyes equal and reactive ENT/NECK:  Supple, no stridor  CARDIO:  regular rate and rhythm, appears well-perfused  PULM:  No respiratory distress, diminished in lung bases, no crackles or wheezing GI/GU:  non-distended, soft MSK/SPINE:  No gross deformities, 2+ pretibial edema bilaterally SKIN:  no rash, atraumatic  *Additional and/or pertinent findings included in MDM below  ED Results / Procedures / Treatments   Labs (all labs ordered are listed, but only abnormal results are displayed) Labs Reviewed  COMPREHENSIVE METABOLIC PANEL - Abnormal; Notable for the following components:      Result Value   Potassium 5.3 (*)    Chloride 112 (*)    CO2 21 (*)    Glucose, Bld 164 (*)    BUN 54 (*)    Creatinine, Ser 5.41 (*)    Calcium 8.5 (*)    Total Protein 5.7 (*)    Albumin 2.8 (*)    GFR, Estimated 8 (*)    All other components within normal limits  CBC - Abnormal; Notable for the following components:   RBC 2.53 (*)    Hemoglobin 7.1 (*)    HCT 23.0 (*)    All other components within normal limits  VITAMIN B12  FOLATE  IRON AND TIBC  FERRITIN  RETICULOCYTES  BRAIN NATRIURETIC PEPTIDE  BASIC METABOLIC PANEL  MAGNESIUM  BASIC METABOLIC PANEL  CBC  FERRITIN  FOLATE  POC OCCULT BLOOD, ED  TYPE AND SCREEN  TROPONIN I (HIGH SENSITIVITY)  TROPONIN I (HIGH SENSITIVITY)  TROPONIN I (HIGH SENSITIVITY)    EKG EKG Interpretation  Date/Time:  Thursday Jan 21 2023 12:19:09 EDT Ventricular Rate:  61 PR Interval:  162 QRS Duration: 134 QT Interval:  494 QTC  Calculation: 497 R Axis:   4 Text Interpretation: Sinus rhythm with Premature atrial complexes Right bundle branch block Abnormal ECG When compared with ECG of 26-Dec-2022 21:01, No significant change since last tracing Confirmed by Alvino Blood ([id number]) on 01/21/2023 3:05:12 PM  Radiology DG Chest 2 View  Result Date: 01/21/2023 CLINICAL DATA:  Chest pressure and drop in hemoglobin EXAM: CHEST - 2 VIEW COMPARISON:  Chest radiograph dated 12/26/2022 FINDINGS: Normal lung volumes. Increased bilateral interstitial opacities. New trace bilateral pleural  effusions. No pneumothorax. The heart size and mediastinal contours are within normal limits. No acute osseous abnormality. IMPRESSION: 1. Increased bilateral interstitial opacities, likely pulmonary edema. 2. New trace bilateral pleural effusions. Electronically Signed   By: Agustin Cree M.D.   On: 01/21/2023 13:28    Procedures Procedures    Medications Ordered in ED Medications  amLODipine (NORVASC) tablet 10 mg (has no administration in time range)  carvedilol (COREG) tablet 25 mg (has no administration in time range)  isosorbide mononitrate (IMDUR) 24 hr tablet 60 mg (has no administration in time range)  rosuvastatin (CRESTOR) tablet 40 mg (has no administration in time range)  pantoprazole (PROTONIX) EC tablet 20 mg (has no administration in time range)  docusate sodium (COLACE) capsule 100 mg (has no administration in time range)  thiamine (VITAMIN B1) tablet 100 mg (has no administration in time range)  heparin injection 5,000 Units (has no administration in time range)  insulin aspart (novoLOG) injection 0-9 Units (has no administration in time range)  furosemide (LASIX) injection 80 mg (has no administration in time range)  furosemide (LASIX) injection 40 mg (40 mg Intravenous Given 01/21/23 1659)    ED Course/ Medical Decision Making/ A&P                             Medical Decision Making Amount and/or Complexity of Data  Reviewed Labs: ordered. Radiology: ordered.  Risk Prescription drug management. Decision regarding hospitalization.   Initial Impression and Ddx 72 year old well-appearing female presenting for abnormal labs.  Exam notable for diminished lung sounds in the bases.  DDx includes GI bleed, symptomatic anemia, CHF exacerbation, ACS, pneumonia, AKI, and electrolyte derangement. Patient PMH that increases complexity of ED encounter:   H/o hypertensive cardiomyopathy, diabetes type 2, CAD, RBBB, CKD and CHF  Interpretation of Diagnostics - I independent reviewed and interpreted the labs as followed: Hyperkalemia, elevated BUN and creatinine, anemia  - I independently visualized the following imaging with scope of interpretation limited to determining acute life threatening conditions related to emergency care: Chest x-ray, which revealed bilateral interstitial opacities consistent with pulmonary edema and trace pleural effusions  -I personally reviewed and interpreted EKG which revealed sinus rhythm with RBBB  Patient Reassessment and Ultimate Disposition/Management Given suggestions of pulmonary edema on x-ray, lower extremity edema, shortness of breath, primary concern of CHF exacerbation.  Labs also indicate AKI which could be attributed to cardiorenal syndrome secondary to CHF.  Discussed patient with Dr. Marisue Humble of nephrology who advised that this time no acute treatment is indicated for reduced kidney function. Advised to treat with 120 milligrams of Lasix.  Also advised that nephrology will see her in consult.  Admitted to hospital service with Dr. Lincoln Maxin for CHF exacerbation.  Patient management required discussion with the following services or consulting groups:  Hospitalist Service and Nephrology  Complexity of Problems Addressed Acute complicated illness or Injury  Additional Data Reviewed and Analyzed Further history obtained from: Past medical history and medications listed in  the EMR, Prior ED visit notes, and Recent discharge summary  Patient Encounter Risk Assessment Consideration of hospitalization         Final Clinical Impression(s) / ED Diagnoses Final diagnoses:  Acute on chronic congestive heart failure, unspecified heart failure type [facility])    Rx / DC Orders ED Discharge Orders     None         Gareth Eagle, PA-C 01/21/23 1749    Suezanne Jacquet,  Jerilee Field, MD 01/22/23 [phone]

## 2023-01-21 NOTE — Progress Notes (Signed)
Patient confused, agitated.  Wanting to go home.  Patient is alert to self, place and time but disoriented to situation and very forgetful and agitated.  I resumed patient's home dose of Seroquel.

## 2023-01-21 NOTE — ED Notes (Signed)
ED TO INPATIENT HANDOFF REPORT  ED Nurse Name and Phone #: Vernona Rieger [phone]  S Name/Age/Gender Teresa Poole 72 y.o. female Room/Bed: 004C/004C  Code Status   Code Status: Full Code  Home/SNF/Other Home Patient oriented to: self, place, time, and situation Is this baseline? Yes   Triage Complete: Triage complete  Chief Complaint Acute exacerbation of CHF (congestive heart failure) (HCC) [I50.9]  Triage Note Pt was sent by PCP for abnormal labs. Per PCP, pt has had significant drop in hemoglobin 7.2, hematocrit 23.2. Pt c/o weakness and SOB started yesterday. Pt denies dizziness or blood in BM.    Allergies Allergies  Allergen Reactions   Hydralazine Hcl Itching    ENTIRE BODY = burning sensation, also in the eyes (they have become very sensitive to light)   Atorvastatin Other (See Comments)    Per MD - pt not sure of reaction     Level of Care/Admitting Diagnosis ED Disposition     ED Disposition  Admit   Condition  --   Comment  Hospital Area: [facility] [[id number]]  Level of Care: Telemetry Cardiac [[id number]]  May place patient in observation at [facility] or [facility] if equivalent level of care is available:: No  Covid Evaluation: Confirmed COVID Negative  Diagnosis: Acute exacerbation of CHF (congestive heart failure) [facility]) [[id number]]  Admitting Physician: Burnadette Pop [[id number]]  Attending Physician: Burnadette Pop [[id number]]          B Medical/Surgery History Past Medical History:  Diagnosis Date   Allergy    Anemia    Carotid stenosis    Chronic combined systolic and diastolic CHF (congestive heart failure) (HCC)    CKD (chronic kidney disease), stage IV (HCC) 09/24/2013   Pt at [facility], Dr. Marisue Humble   COVID-19 10/24/2021   Diabetes mellitus type 2 in nonobese [facility])    Edema 09/14/2013   GERD (gastroesophageal reflux disease)    History of ovarian cancer    Hyperlipidemia    Hypertension    Mild CAD    non-obstructive  by LHC (09/25/2013): Proximal and mid LAD serial 20%, proximal circumflex 30%, mid AV groove circumflex 30%, mid RCA mild plaque.   NICM (nonischemic cardiomyopathy) (HCC)    Obesity (BMI 30-39.9)    RBBB    Renal artery stenosis (HCC) 10/11/2018   Thyroid disease    Seen by specialist   Past Surgical History:  Procedure Laterality Date   BIOPSY  12/22/2022   Procedure: BIOPSY;  Surgeon: Shellia Cleverly, DO;  Location: [facility] ENDOSCOPY;  Service: Gastroenterology;;   ESOPHAGOGASTRODUODENOSCOPY N/A 12/22/2022   Procedure: ESOPHAGOGASTRODUODENOSCOPY (EGD);  Surgeon: Shellia Cleverly, DO;  Location: [facility] ENDOSCOPY;  Service: Gastroenterology;  Laterality: N/A;   LEFT HEART CATHETERIZATION WITH CORONARY ANGIOGRAM N/A 09/25/2013   Procedure: LEFT HEART CATHETERIZATION WITH CORONARY ANGIOGRAM;  Surgeon: Kathleene Hazel, MD;  Location: [facility] CATH LAB;  Service: Cardiovascular;  Laterality: N/A;   RENAL ANGIOGRAPHY N/A 10/06/2018   Procedure: RENAL ANGIOGRAPHY;  Surgeon: Cephus Shelling, MD;  Location: [facility] INVASIVE CV LAB;  Service: Cardiovascular;  Laterality: N/A;     A IV Location/Drains/Wounds Patient Lines/Drains/Airways Status     Active Line/Drains/Airways     Name Placement date Placement time Site Days   Peripheral IV 01/21/23 20 G 1.88" Posterior;Right Forearm 01/21/23  1542  Forearm  less than 1            Intake/Output Last 24 hours No intake or output data in the 24  hours ending 01/21/23 1738  Labs/Imaging Results for orders placed or performed during the hospital encounter of 01/21/23 (from the past 48 hour(s))  Type and screen [facility]     Status: None   Collection Time: 01/21/23 12:34 PM  Result Value Ref Range   ABO/RH(D) O POS    Antibody Screen NEG    Sample Expiration      01/24/2023,2359 Performed at [facility], [address], [place], [place] [address]   Comprehensive metabolic panel     Status: Abnormal   Collection Time:  01/21/23 12:44 PM  Result Value Ref Range   Sodium 142 135 - 145 mmol/L   Potassium 5.3 (H) 3.5 - 5.1 mmol/L   Chloride 112 (H) 98 - 111 mmol/L   CO2 21 (L) 22 - 32 mmol/L   Glucose, Bld 164 (H) 70 - 99 mg/dL    Comment: Glucose reference range applies only to samples taken after fasting for at least 8 hours.   BUN 54 (H) 8 - 23 mg/dL   Creatinine, Ser 6.04 (H) 0.44 - 1.00 mg/dL   Calcium 8.5 (L) 8.9 - 10.3 mg/dL   Total Protein 5.7 (L) 6.5 - 8.1 g/dL   Albumin 2.8 (L) 3.5 - 5.0 g/dL   AST 16 15 - 41 U/L   ALT 13 0 - 44 U/L   Alkaline Phosphatase 52 38 - 126 U/L   Total Bilirubin 0.5 0.3 - 1.2 mg/dL   GFR, Estimated 8 (L) >60 mL/min    Comment: (NOTE) Calculated using the CKD-EPI Creatinine Equation (2021)    Anion gap 9 5 - 15    Comment: Performed at [facility], [address] [place], [place] [address]  CBC     Status: Abnormal   Collection Time: 01/21/23 12:44 PM  Result Value Ref Range   WBC 5.3 4.0 - 10.5 K/uL   RBC 2.53 (L) 3.87 - 5.11 MIL/uL   Hemoglobin 7.1 (L) 12.0 - 15.0 g/dL   HCT 11.9 (L) 14.7 - 82.9 %   MCV 90.9 80.0 - 100.0 fL   MCH 28.1 26.0 - 34.0 pg   MCHC 30.9 30.0 - 36.0 g/dL   RDW 56.2 13.0 - 86.5 %   Platelets 212 150 - 400 K/uL   nRBC 0.0 0.0 - 0.2 %    Comment: Performed at [facility], [address], [place], [place] [address]  Troponin I (High Sensitivity)     Status: None   Collection Time: 01/21/23 12:44 PM  Result Value Ref Range   Troponin I (High Sensitivity) 16 <18 ng/L    Comment: (NOTE) Elevated high sensitivity troponin I (hsTnI) values and significant  changes across serial measurements may suggest ACS but many other  chronic and acute conditions are known to elevate hsTnI results.  Refer to the "Links" section for chest pain algorithms and additional  guidance. Performed at [facility], [address] [place], [place] [address]   POC occult blood, ED     Status: None   Collection Time: 01/21/23  2:47 PM   Result Value Ref Range   Fecal Occult Bld NEGATIVE NEGATIVE   DG Chest 2 View  Result Date: 01/21/2023 CLINICAL DATA:  Chest pressure and drop in hemoglobin EXAM: CHEST - 2 VIEW COMPARISON:  Chest radiograph dated 12/26/2022 FINDINGS: Normal lung volumes. Increased bilateral interstitial opacities. New trace bilateral pleural effusions. No pneumothorax. The heart size and mediastinal contours are within  normal limits. No acute osseous abnormality. IMPRESSION: 1. Increased bilateral interstitial opacities, likely pulmonary edema. 2. New trace bilateral pleural effusions. Electronically Signed   By: Agustin Cree M.D.   On: 01/21/2023 13:28    Pending Labs Unresulted Labs (From admission, onward)     Start     Ordered   01/22/23 0500  Basic metabolic panel  Tomorrow morning,   R        01/21/23 1719   01/22/23 0500  CBC  Tomorrow morning,   R        01/21/23 1719   01/21/23 1619  Basic metabolic panel  Once,   STAT        01/21/23 1619   01/21/23 1619  Magnesium  Once,   STAT        01/21/23 1619   01/21/23 1535  Brain natriuretic peptide  Once,   URGENT        01/21/23 1534   01/21/23 1409  Vitamin B12  (Anemia Panel (PNL))  Once,   URGENT        01/21/23 1409   01/21/23 1409  Folate  (Anemia Panel (PNL))  Once,   URGENT        01/21/23 1409   01/21/23 1409  Iron and TIBC  (Anemia Panel (PNL))  Once,   URGENT        01/21/23 1409   01/21/23 1409  Ferritin  (Anemia Panel (PNL))  Once,   URGENT        01/21/23 1409   01/21/23 1409  Reticulocytes  (Anemia Panel (PNL))  Once,   URGENT        01/21/23 1409            Vitals/Pain Today's Vitals   01/21/23 1615 01/21/23 1630 01/21/23 1638 01/21/23 1714  BP: (!) 165/69 (!) 157/73  (!) 159/77  Pulse: 71 72  71  Resp:    17  Temp:   98.6 F (37 C)   TempSrc:   Oral   SpO2: 100% 100%  99%  Weight:      Height:      PainSc:        Isolation Precautions No active isolations  Medications Medications  amLODipine (NORVASC)  tablet 10 mg (has no administration in time range)  carvedilol (COREG) tablet 25 mg (has no administration in time range)  isosorbide mononitrate (IMDUR) 24 hr tablet 60 mg (has no administration in time range)  rosuvastatin (CRESTOR) tablet 40 mg (has no administration in time range)  pantoprazole (PROTONIX) EC tablet 20 mg (has no administration in time range)  docusate sodium (COLACE) capsule 100 mg (has no administration in time range)  thiamine (VITAMIN B1) tablet 100 mg (has no administration in time range)  heparin injection 5,000 Units (has no administration in time range)  insulin aspart (novoLOG) injection 0-9 Units (has no administration in time range)  furosemide (LASIX) injection 40 mg (40 mg Intravenous Given 01/21/23 1659)    Mobility walks     Focused Assessments Cardiac Assessment Handoff:    Lab Results  Component Value Date   CKTOTAL 115 12/22/2022   TROPONINI <0.03 11/27/2018   Lab Results  Component Value Date   DDIMER 1.61 (H) 02/28/2022   Does the Patient currently have chest pain? No    R Recommendations: See Admitting Provider Note  Report given to:   Additional Notes: Ambulatory. Has periods of confusion with events, but is A&O x4. Easily reoriented.

## 2023-01-21 NOTE — Progress Notes (Signed)
Pt arrived from ED, when RN went to get consent for the pRBC. Pt then states that she needed to talk to the MD because she was not aware. RN reached out to hospitalist, MD stated that he already explained to patient and sister. Patient then called sister and they both agreed to the 1 unit. RN obtained the consent for pt to sign and she then becomes upset and says she didn't want it and verbally aggressive. MD made aware, stated that he will see her tomorrow and she can get it then.   Pt received 40mg  of lasix in ED before arriving to [facility]. Clarified with MD to only give 40mg  and not 80mg .

## 2023-01-21 NOTE — H&P (Signed)
History and Physical    Teresa Poole:[id number] DOB: [birth year] DOA: 01/21/2023  PCP: Malka So., MD   Patient coming from: Home    Chief Complaint: Sent for evaluation of abnormal labs.  HPI: Teresa Poole is a 72 y.o. female with medical history significant of combined systolic/diastolic CHF, CKD stage IV, hypertension, diabetes type 2, coronary artery  disease, obesity, renal artery stenosis who presented to the emergency department as per suggestion of her PCP for the evaluation of abnormal labs.  She was seen by her PCP yesterday for the evaluation of her allergies and basic labs and was found to have a hemoglobin of 7.  Patient also mentioned that she has been short of breath and tired for last 1 month.  Patient has been noticing lower extremity edema more than usual.  Patient does not take diuretics at home currently. Patient lives alone.  Ambulates fine.  Drives.  She has a sister. She said she saw Dr. Marisue Humble in his office about a month ago. Patient seen and examined at bedside in the Emergency Department.  During my evaluation, she was hemodynamically stable, comfortable.  She was on room air.  She mentioned about her lower extremity edema.  She denies any cough or shortness of breath.  No history of fever, chills, abdominal pain, nausea, vomiting or diarrhea.  She denies any black or bloody stool. I called her sister and updated her about her management plan.  As per the sister, she is noncompliant with her medications  ED Course: Slightly hypertensive in the emergency department.  Lab work showed potassium of 5.3, creatinine of 5.41, hemoglobin of 7.1.  Chest x-ray showed features of pulmonary edema/trace bilateral pleural effusion.  Review of Systems: As per HPI otherwise 10 point review of systems negative.    Past Medical History:  Diagnosis Date   Allergy    Anemia    Carotid stenosis    Chronic combined systolic and diastolic CHF (congestive heart  failure) (HCC)    CKD (chronic kidney disease), stage IV (HCC) 09/24/2013   Pt at [facility], Dr. Marisue Humble   COVID-19 10/24/2021   Diabetes mellitus type 2 in nonobese [facility])    Edema 09/14/2013   GERD (gastroesophageal reflux disease)    History of ovarian cancer    Hyperlipidemia    Hypertension    Mild CAD    non-obstructive by LHC (09/25/2013): Proximal and mid LAD serial 20%, proximal circumflex 30%, mid AV groove circumflex 30%, mid RCA mild plaque.   NICM (nonischemic cardiomyopathy) (HCC)    Obesity (BMI 30-39.9)    RBBB    Renal artery stenosis (HCC) 10/11/2018   Thyroid disease    Seen by specialist    Past Surgical History:  Procedure Laterality Date   BIOPSY  12/22/2022   Procedure: BIOPSY;  Surgeon: Shellia Cleverly, DO;  [facility]: [facility] ENDOSCOPY;  Service: Gastroenterology;;   ESOPHAGOGASTRODUODENOSCOPY N/A 12/22/2022   Procedure: ESOPHAGOGASTRODUODENOSCOPY (EGD);  Surgeon: Shellia Cleverly, DO;  Location: [facility] ENDOSCOPY;  Service: Gastroenterology;  Laterality: N/A;   LEFT HEART CATHETERIZATION WITH CORONARY ANGIOGRAM N/A 09/25/2013   Procedure: LEFT HEART CATHETERIZATION WITH CORONARY ANGIOGRAM;  Surgeon: Kathleene Hazel, MD;  Location: [facility] CATH LAB;  Service: Cardiovascular;  Laterality: N/A;   RENAL ANGIOGRAPHY N/A 10/06/2018   Procedure: RENAL ANGIOGRAPHY;  Surgeon: Cephus Shelling, MD;  Location: [facility] INVASIVE CV LAB;  Service: Cardiovascular;  Laterality: N/A;     reports that she has never smoked. She has  never used smokeless tobacco. She reports that she does not drink alcohol and does not use drugs.  Allergies  Allergen Reactions   Hydralazine Hcl Itching    ENTIRE BODY = burning sensation, also in the eyes (they have become very sensitive to light)   Atorvastatin Other (See Comments)    Per MD - pt not sure of reaction     Family History  Problem Relation Age of Onset   Diabetes Father    Hypertension Father    Hypertension Maternal  Grandfather      Prior to Admission medications   Medication Sig Start Date End Date Taking? Authorizing Provider  amLODipine (NORVASC) 10 MG tablet Take 1 tablet (10 mg total) by mouth daily. 01/06/23   Corky Crafts, MD  aspirin EC 81 MG tablet Take 1 tablet (81 mg total) by mouth daily. Swallow whole. Patient taking differently: Take 81 mg by mouth at bedtime. Swallow whole. 11/14/22   Regalado, Belkys A, MD  azelastine (ASTELIN) 0.1 % nasal spray Place 2 sprays into both nostrils 2 (two) times daily. 12/02/22   [provider]  carvedilol (COREG) 25 MG tablet Take 1 tablet (25 mg total) by mouth 2 (two) times daily with a meal. 01/06/23   Corky Crafts, MD  CEQUA 0.09 % SOLN Apply 1 drop to eye 2 (two) times daily. Patient not taking: Reported on 01/03/2023 12/30/22   [provider]  cetirizine (ZYRTEC) 10 MG tablet Take 10 mg by mouth daily as needed for allergies. 11/19/22   [provider]  cyanocobalamin 1000 MCG tablet Take 1 tablet (1,000 mcg total) by mouth daily. 11/14/22   Regalado, Belkys A, MD  docusate sodium (COLACE) 100 MG capsule Take 1 capsule (100 mg total) by mouth 2 (two) times daily. Patient not taking: Reported on 01/03/2023 11/13/22   Regalado, Jon Billings A, MD  glucose blood (ONE TOUCH ULTRA TEST) test strip Use to check blood sugar 3 times daily Dx code E11.65 Patient taking differently: 1 each by Other route See admin instructions. Use to check blood sugar 3 times daily Dx code E11.65 11/20/14   Reather Littler, MD  iron polysaccharides (NIFEREX) 150 MG capsule Take 1 capsule (150 mg total) by mouth daily. Patient not taking: Reported on 12/22/2022 11/14/22   Hartley Barefoot A, MD  isosorbide mononitrate (IMDUR) 60 MG 24 hr tablet Take 1 tablet (60 mg total) by mouth daily. 12/14/22   Sharlene Dory, PA-C  lisinopril (ZESTRIL) 40 MG tablet Take 1 tablet (40 mg total) by mouth daily. 01/06/23   Corky Crafts, MD  loratadine (CLARITIN) 10 MG  tablet Take 1 tablet (10 mg total) by mouth daily. Patient not taking: Reported on 01/03/2023 12/04/22 01/03/23  Curatolo, Adam, DO  OZEMPIC, 0.25 OR 0.5 MG/DOSE, 2 MG/3ML SOPN Inject 0.25 mg into the skin once a week. 12/20/22   [provider]  pantoprazole (PROTONIX) 20 MG tablet Take 1 tablet (20 mg total) by mouth 2 (two) times daily. Patient taking differently: Take 40 mg by mouth every evening. 12/22/22 04/21/23  Dorcas Carrow, MD  QUEtiapine (SEROQUEL) 25 MG tablet Take 1 tablet (25 mg total) by mouth 2 (two) times daily as needed (Agitation). 11/13/22   Regalado, Belkys A, MD  QUEtiapine (SEROQUEL) 50 MG tablet Take 1 tablet (50 mg total) by mouth at bedtime. 11/13/22   Regalado, Belkys A, MD  rosuvastatin (CRESTOR) 40 MG tablet Take 1 tablet (40 mg total) by mouth daily. 12/17/22  Jari Favre N, PA-C  sucralfate (CARAFATE) 1 g tablet Take 1 tablet (1 g total) by mouth with breakfast, with lunch, and with evening meal for 7 days. 12/27/22 01/03/23  Tanda Rockers A, DO  thiamine (VITAMIN B-1) 100 MG tablet Take 1 tablet (100 mg total) by mouth daily. 11/14/22   Alba Cory, MD    Physical Exam: Vitals:   01/21/23 1615 01/21/23 1630 01/21/23 1638 01/21/23 1714  BP: (!) 165/69 (!) 157/73  (!) 159/77  Pulse: 71 72  71  Resp:    17  Temp:   98.6 F (37 C)   TempSrc:   Oral   SpO2: 100% 100%  99%  Weight:      Height:        Constitutional: calm, comfortable Vitals:   01/21/23 1615 01/21/23 1630 01/21/23 1638 01/21/23 1714  BP: (!) 165/69 (!) 157/73  (!) 159/77  Pulse: 71 72  71  Resp:    17  Temp:   98.6 F (37 C)   TempSrc:   Oral   SpO2: 100% 100%  99%  Weight:      Height:       Eyes: PERRL, lids and conjunctivae normal ENMT: Mucous membranes are moist.  Neck: normal, supple, no masses, no thyromegaly Respiratory: clear to auscultation bilaterally, no wheezing, no crackles. Normal respiratory effort. No accessory muscle use.  Cardiovascular: Regular rate and  rhythm, no murmurs / rubs / gallops. 2-3+ bilateral lower extremity edema Abdomen: no tenderness, no masses palpated. No hepatosplenomegaly. Bowel sounds positive.  Musculoskeletal: no clubbing / cyanosis. No joint deformity upper and lower extremities.  Skin: no rashes, lesions, ulcers. No induration Neurologic: CN 2-12 grossly intact.  Strength 5/5 in all 4.  Psychiatric: Knows current month and place.  Could not tell the day Foley Catheter:None  Labs on Admission: I have personally reviewed following labs and imaging studies  CBC: Recent Labs  Lab 01/21/23 1244  WBC 5.3  HGB 7.1*  HCT 23.0*  MCV 90.9  PLT 212   Basic Metabolic Panel: Recent Labs  Lab 01/21/23 1244  NA 142  K 5.3*  CL 112*  CO2 21*  GLUCOSE 164*  BUN 54*  CREATININE 5.41*  CALCIUM 8.5*   GFR: Estimated Creatinine Clearance: 9.3 mL/min (A) (by C-G formula based on SCr of 5.41 mg/dL (H)). Liver Function Tests: Recent Labs  Lab 01/21/23 1244  AST 16  ALT 13  ALKPHOS 52  BILITOT 0.5  PROT 5.7*  ALBUMIN 2.8*   No results for input(s): "LIPASE", "AMYLASE" in the last 168 hours. No results for input(s): "AMMONIA" in the last 168 hours. Coagulation Profile: No results for input(s): "INR", "PROTIME" in the last 168 hours. Cardiac Enzymes: No results for input(s): "CKTOTAL", "CKMB", "CKMBINDEX", "TROPONINI" in the last 168 hours. BNP (last 3 results) No results for input(s): "PROBNP" in the last 8760 hours. HbA1C: No results for input(s): "HGBA1C" in the last 72 hours. CBG: No results for input(s): "GLUCAP" in the last 168 hours. Lipid Profile: No results for input(s): "CHOL", "HDL", "LDLCALC", "TRIG", "CHOLHDL", "LDLDIRECT" in the last 72 hours. Thyroid Function Tests: No results for input(s): "TSH", "T4TOTAL", "FREET4", "T3FREE", "THYROIDAB" in the last 72 hours. Anemia Panel: No results for input(s): "VITAMINB12", "FOLATE", "FERRITIN", "TIBC", "IRON", "RETICCTPCT" in the last 72 hours. Urine  analysis:    Component Value Date/Time   COLORURINE YELLOW 12/22/2022 0028   APPEARANCEUR CLEAR 12/22/2022 0028   LABSPEC 1.011 12/22/2022 0028   PHURINE 6.0 12/22/2022  0028   GLUCOSEU NEGATIVE 12/22/2022 0028   HGBUR NEGATIVE 12/22/2022 0028   BILIRUBINUR NEGATIVE 12/22/2022 0028   KETONESUR NEGATIVE 12/22/2022 0028   PROTEINUR >=300 (A) 12/22/2022 0028   UROBILINOGEN 0.2 09/22/2013 1451   NITRITE NEGATIVE 12/22/2022 0028   LEUKOCYTESUR TRACE (A) 12/22/2022 0028    Radiological Exams on Admission: DG Chest 2 View  Result Date: 01/21/2023 CLINICAL DATA:  Chest pressure and drop in hemoglobin EXAM: CHEST - 2 VIEW COMPARISON:  Chest radiograph dated 12/26/2022 FINDINGS: Normal lung volumes. Increased bilateral interstitial opacities. New trace bilateral pleural effusions. No pneumothorax. The heart size and mediastinal contours are within normal limits. No acute osseous abnormality. IMPRESSION: 1. Increased bilateral interstitial opacities, likely pulmonary edema. 2. New trace bilateral pleural effusions. Electronically Signed   By: Agustin Cree M.D.   On: 01/21/2023 13:28     Assessment/Plan Principal Problem:   Acute exacerbation of CHF (congestive heart failure) (HCC) Active Problems:   Acute renal failure superimposed on stage 4 chronic kidney disease (HCC)   Hypertension   Type 2 diabetes mellitus with chronic kidney disease, without long-term current use of insulin (HCC)   Anemia  Acute on chronic combined systolic/diastolic CHF: Appears volume overloaded on presentation with bilateral lower extremity swelling.  Chest x-ray showed features of pulmonary edema, trace bilateral pleural effusion.  This is mostly secondary to cardiorenal syndrome due to her worsening kidney function.we will check BNP.  Patient does not take any diuretics at home.  It was a stopped due to her progressive CKD.  Will start on IV diuresis.  Continue to monitor daily input/output, weight Last echo has shown  EF of 45 to 50% grade 2 diastolic dysfunction.  AKI in CKD stage V: Baseline creatinine usually  in the range of 4.  Patient presented with creatinine in the range of 5.  She follows with nephrology, Dr. Marisue Humble.  Nephrology consulted also has history of renal artery stenosis.  Normocytic anemia: Hemoglobin was found to be in the range of 7.  Baseline hemoglobin is from 8-9.  No evidence of acute blood loss.  No report of hematochezia or melena.  This is most likely secondary to her progressive CKD.  Will transfuse her with a unit of PRBC.  Will check iron studies, vitamin B12, folic acid, FOBT. Patient was admitted for the same in April.  At that time GI was consulted and she underwent EGD with finding of Barrett's esophagus, no active bleeding, recommended to continue Protonix 20 mg twice daily at that time.  History of coronary artery disease: Found to have nonobstructive coronary disease as per cardiac cath on 2015  Diabetes type 2: Takes Ozempic at home.  Continue sliding scale insulin here.  Monitor blood sugars  Hypertension: Presented with hypertension.  Takes Imdur, lisinopril, amlodipine, Coreg at home.  Will hold lisinopril due to AKI.  History of memory impairment/adjustment disorder with anxiety: On Seroquel as per the records.  As per sister, she is noncompliant and she does not take any medications at home most of the time.  Will hold Seroquel for now  History of OSA: Not on CPAP  Hyperlipidemia: On statin          Severity of Illness: The appropriate patient status for this patient is OBSERVATION. Observation status is judged to be reasonable and necessary in order to provide the required intensity of service to ensure the patient's safety. The patient's presenting symptoms, physical exam findings, and initial radiographic and laboratory data in the context  of their medical condition is felt to place them at decreased risk for further clinical deterioration. Furthermore,  it is anticipated that the patient will be medically stable for discharge from the hospital within 2 midnights of admission.    DVT prophylaxis: Heparin subcu  code Status: Full code Family Communication:  Consults called: Nephrology     Burnadette Pop MD [facility]  01/21/2023, 5:20 PM

## 2023-01-21 NOTE — ED Triage Notes (Signed)
Pt was sent by PCP for abnormal labs. Per PCP, pt has had significant drop in hemoglobin 7.2, hematocrit 23.2. Pt c/o weakness and SOB started yesterday. Pt denies dizziness or blood in BM.

## 2023-01-21 NOTE — Consult Note (Signed)
Teresa Poole Human Teresa Poole Admit Date: 01/21/2023 01/21/2023 Teresa Poole Requesting Physician:  Renford Dills MD  Reason for Consult:  AoCKD5 HPI:  89F PMH including CKD 5 (I see at [facility]), hypertension, DM2, combined chronic systolic and diastolic heart failure, CAD, history of renal artery stenosis who presented to the hospital earlier today at the suggestion of PCP because of anemia.  Apparently hemoglobin 7.0, here today is 7.1.  She was also found to have worsening edema in the lower extremities, mild worsening of her GFR.  Her situation is complicated by significant mental health barriers including a failure to have insight into her status with associated memory impairment and nonadherence to therapy.  When last seen March in the hospital Dr. Malen Gauze been generally agreed that she was a poor candidate for dialysis.  This is consistent with previous conversations I had with her.  At the time of seeing her she is sitting up at side of bed, she is pleasant and engaging but clearly only has superficial understanding of her status.  He has no respiratory distress hemoglobin.  Blood pressures are fairly stable for her.  She states that her legs have been swollen intermittently.  It appears that she is noncompliant with medications at home.    2 view chest x-ray here today with increased bilateral interstitial opacities likely pulmonary edema with trace bilateral pleural effusions.  She has been placed on Lasix 80 mg IV twice daily.   Iron panel checked today has ferritin of 16, iron saturation of 12%.  Creatinine, Ser (mg/dL)  Date Value  62/95/2841 5.25 (H)  01/21/2023 5.41 (H)  01/03/2023 4.66 (H)  12/26/2022 4.97 (H)  12/22/2022 4.52 (H)  12/21/2022 4.47 (H)  12/07/2022 4.13 (H)  12/04/2022 3.80 (H)  11/29/2022 3.72 (H)  11/15/2022 4.54 (H)  ]  ROS No identified exposure to NSAIDs, iodinated contrast  Balance of 12 systems is negative w/ exceptions as above  PMH  Past Medical History:   Diagnosis Date   Allergy    Anemia    Carotid stenosis    Chronic combined systolic and diastolic CHF (congestive heart failure) (HCC)    CKD (chronic kidney disease), stage IV (HCC) 09/24/2013   Pt at [facility], Dr. Marisue Humble   COVID-19 10/24/2021   Diabetes mellitus type 2 in nonobese [facility])    Edema 09/14/2013   GERD (gastroesophageal reflux disease)    History of ovarian cancer    Hyperlipidemia    Hypertension    Mild CAD    non-obstructive by LHC (09/25/2013): Proximal and mid LAD serial 20%, proximal circumflex 30%, mid AV groove circumflex 30%, mid RCA mild plaque.   NICM (nonischemic cardiomyopathy) (HCC)    Obesity (BMI 30-39.9)    RBBB    Renal artery stenosis (HCC) 10/11/2018   Thyroid disease    Seen by specialist   [facility]  Past Surgical History:  Procedure Laterality Date   BIOPSY  12/22/2022   Procedure: BIOPSY;  Surgeon: Shellia Cleverly, DO;  Location: [facility] ENDOSCOPY;  Service: Gastroenterology;;   ESOPHAGOGASTRODUODENOSCOPY N/A 12/22/2022   Procedure: ESOPHAGOGASTRODUODENOSCOPY (EGD);  Surgeon: Shellia Cleverly, DO;  Location: [facility] ENDOSCOPY;  Service: Gastroenterology;  Laterality: N/A;   LEFT HEART CATHETERIZATION WITH CORONARY ANGIOGRAM N/A 09/25/2013   Procedure: LEFT HEART CATHETERIZATION WITH CORONARY ANGIOGRAM;  Surgeon: Kathleene Hazel, MD;  Location: [facility] CATH LAB;  Service: Cardiovascular;  Laterality: N/A;   RENAL ANGIOGRAPHY N/A 10/06/2018   Procedure: RENAL ANGIOGRAPHY;  Surgeon: Cephus Shelling, MD;  Location:  [facility] INVASIVE CV LAB;  Service: Cardiovascular;  Laterality: N/A;   FH  Family History  Problem Relation Age of Onset   Diabetes Father    Hypertension Father    Hypertension Maternal Grandfather    SH  reports that she has never smoked. She has never used smokeless tobacco. She reports that she does not drink alcohol and does not use drugs. Allergies  Allergies  Allergen Reactions   Hydralazine Hcl Itching    ENTIRE BODY =  burning sensation, also in the eyes (they have become very sensitive to light)   Atorvastatin Other (See Comments)    Per MD - pt not sure of reaction    Home medications Prior to Admission medications   Medication Sig Start Date End Date Taking? Authorizing Provider  amLODipine (NORVASC) 10 MG tablet Take 1 tablet (10 mg total) by mouth daily. 01/06/23  Yes Corky Crafts, MD  aspirin EC 81 MG tablet Take 1 tablet (81 mg total) by mouth daily. Swallow whole. Patient taking differently: Take 81 mg by mouth at bedtime. Swallow whole. 11/14/22  Yes Regalado, Belkys A, MD  azelastine (ASTELIN) 0.1 % nasal spray Place 2 sprays into both nostrils 2 (two) times daily. 12/02/22  Yes [provider]  carvedilol (COREG) 25 MG tablet Take 1 tablet (25 mg total) by mouth 2 (two) times daily with a meal. 01/06/23  Yes Corky Crafts, MD  cetirizine (ZYRTEC) 10 MG tablet Take 10 mg by mouth daily as needed for allergies. 11/19/22  Yes [provider]  cyanocobalamin 1000 MCG tablet Take 1 tablet (1,000 mcg total) by mouth daily. 11/14/22  Yes Regalado, Belkys A, MD  isosorbide mononitrate (IMDUR) 60 MG 24 hr tablet Take 1 tablet (60 mg total) by mouth daily. 12/14/22  Yes Conte, Tessa N, PA-C  lisinopril (ZESTRIL) 40 MG tablet Take 1 tablet (40 mg total) by mouth daily. 01/06/23  Yes Corky Crafts, MD  OZEMPIC, 0.25 OR 0.5 MG/DOSE, 2 MG/3ML SOPN Inject 0.25 mg into the skin once a week. 12/20/22  Yes [provider]  pantoprazole (PROTONIX) 20 MG tablet Take 1 tablet (20 mg total) by mouth 2 (two) times daily. Patient taking differently: Take 40 mg by mouth every evening. 12/22/22 04/21/23 Yes Dorcas Carrow, MD  QUEtiapine (SEROQUEL) 50 MG tablet Take 1 tablet (50 mg total) by mouth at bedtime. 11/13/22  Yes Regalado, Belkys A, MD  rosuvastatin (CRESTOR) 40 MG tablet Take 1 tablet (40 mg total) by mouth daily. 12/17/22  Yes Conte, Tessa N, PA-C  thiamine (VITAMIN B-1) 100 MG  tablet Take 1 tablet (100 mg total) by mouth daily. 11/14/22  Yes Regalado, Belkys A, MD  docusate sodium (COLACE) 100 MG capsule Take 1 capsule (100 mg total) by mouth 2 (two) times daily. Patient not taking: Reported on 01/03/2023 11/13/22   Regalado, Jon Billings A, MD  glucose blood (ONE TOUCH ULTRA TEST) test strip Use to check blood sugar 3 times daily Dx code E11.65 Patient taking differently: 1 each by Other route See admin instructions. Use to check blood sugar 3 times daily Dx code E11.65 11/20/14   Reather Littler, MD  iron polysaccharides (NIFEREX) 150 MG capsule Take 1 capsule (150 mg total) by mouth daily. Patient not taking: Reported on 12/22/2022 11/14/22   Regalado, Jon Billings A, MD  loratadine (CLARITIN) 10 MG tablet Take 1 tablet (10 mg total) by mouth daily. Patient not taking: Reported on 01/03/2023 12/04/22 01/03/23  Virgina Norfolk, DO  QUEtiapine (SEROQUEL)  25 MG tablet Take 1 tablet (25 mg total) by mouth 2 (two) times daily as needed (Agitation). Patient not taking: Reported on 01/21/2023 11/13/22   Regalado, Jon Billings A, MD  sucralfate (CARAFATE) 1 g tablet Take 1 tablet (1 g total) by mouth with breakfast, with lunch, and with evening meal for 7 days. 12/27/22 01/03/23  Sloan Leiter, DO    Current Medications Scheduled Meds:  sodium chloride   Intravenous Once   amLODipine  10 mg Oral Daily   carvedilol  25 mg Oral BID WC   docusate sodium  100 mg Oral BID   furosemide  80 mg Intravenous Q12H   [START ON 01/22/2023] heparin  5,000 Units Subcutaneous Q8H   [START ON 01/22/2023] insulin aspart  0-9 Units Subcutaneous TID WC   isosorbide mononitrate  60 mg Oral Daily   pantoprazole  20 mg Oral BID   QUEtiapine  50 mg Oral QHS   [START ON 01/22/2023] rosuvastatin  40 mg Oral Daily   thiamine  100 mg Oral Daily   Continuous Infusions: PRN Meds:.LORazepam, QUEtiapine  CBC Recent Labs  Lab 01/21/23 1244  WBC 5.3  HGB 7.1*  HCT 23.0*  MCV 90.9  PLT 212   Basic Metabolic Panel Recent  Labs  Lab 01/21/23 1244 01/21/23 1543  NA 142 141  K 5.3* 5.2*  CL 112* 110  CO2 21* 19*  GLUCOSE 164* 137*  BUN 54* 55*  CREATININE 5.41* 5.25*  CALCIUM 8.5* 8.7*    Physical Exam  Blood pressure (!) 153/61, pulse 70, temperature 97.8 F (36.6 C), temperature source Oral, resp. rate 20, height 5\' 7"  (1.702 m), weight 72 kg, SpO2 99 %. GEN: Sitting at edge of bed, NAD, breathing comfortably, engaging, ENT: NCAT EYES: EOMI CV: Regular, normal S1 and S2, no rub PULM: Clear bilaterally, normal work of breathing ABD: Soft, nontender SKIN: No rashes or lesions EXT: 2+ lower extremity edema extinguishing distal to the knee NEURO: confused, lacks insight  Assessment 36F with progressive CKD 5, anemia which is chronic but worsening having features of iron deficiency, progressive cognitive decline and confusion.  Progressive CKD 5: Probably at or near baseline, doubt there is much of an acute process here.  Very complicated given her cognitive issues.  I think that she is a poor candidate for dialysis further historically has clearly expressed to me that she does not wish to receive it.  She most recently told me this at an office visit in February of this year. I think at this time we should involve palliative care and the family at this time.  Hypervolemia, pulmonary edema, acute on chronic exacerbation CHF: Agree with current dosing of furosemide.  Renal after following urine output overnight .  Nonadherence to the main issue here. Cognitive decline/dementia, poor insight.  Longstanding and progressive. Anemia, likely related to advanced CKD and iron deficiency.  Unlikely that she will see outpatient ESA therapy.  Start by giving IV here. Fereheme 250mg  IV x4 History of Barrett's esophagus therapy Pretension: Follow-up with diuretic.  Not sure how much she takes medications at home  Plan As above Daily weights, Daily Renal Panel, Strict I/Os, Avoid nephrotoxins (NSAIDs, judicious IV  Contrast) WIll follow along   Teresa Poole  01/21/2023, 8:44 PM

## 2023-01-21 NOTE — ED Provider Triage Note (Signed)
Emergency Medicine Provider Triage Evaluation Note  STEPHAINE Poole , a 72 y.o. female  was evaluated in triage.  Pt complains of abnormal labs.  Was seen by her PCP earlier today for her allergies and basic labs revealed that she had a hemoglobin of 7.  Patient mentioned that she has been short of breath and fatigue for the last 4 weeks.  Also feels like "a 10 pound bag of potatoes sitting on her chest".  Denies hematuria, bloody stools, or hematemesis.  Has history of anemia and takes iron supplements.  Is on a daily aspirin.  Review of Systems  Positive: See above Negative: See above  Physical Exam  BP (!) 139/57   Pulse 60   Temp 98.5 F (36.9 C) (Oral)   Resp 16   Ht 5\' 7"  (1.702 m)   Wt 72 kg   SpO2 96%   BMI 24.86 kg/m  Gen:   Awake, no distress   Resp:  Normal effort  MSK:   Moves extremities without difficulty  Other:   Medical Decision Making  Medically screening exam initiated at 12:47 PM.  Appropriate orders placed.  Teresa Poole was informed that the remainder of the evaluation will be completed by another provider, this initial triage assessment does not replace that evaluation, and the importance of remaining in the ED until their evaluation is complete.  Work up started   Gareth Eagle, PA-C 01/21/23 1249

## 2023-01-22 DIAGNOSIS — F05 Delirium due to known physiological condition: Secondary | ICD-10-CM | POA: Diagnosis not present

## 2023-01-22 DIAGNOSIS — F0390 Unspecified dementia without behavioral disturbance: Secondary | ICD-10-CM

## 2023-01-22 DIAGNOSIS — N185 Chronic kidney disease, stage 5: Secondary | ICD-10-CM

## 2023-01-22 DIAGNOSIS — D631 Anemia in chronic kidney disease: Secondary | ICD-10-CM

## 2023-01-22 DIAGNOSIS — I5043 Acute on chronic combined systolic (congestive) and diastolic (congestive) heart failure: Secondary | ICD-10-CM | POA: Diagnosis not present

## 2023-01-22 LAB — GLUCOSE, CAPILLARY
Glucose-Capillary: 105 mg/dL — ABNORMAL HIGH (ref 70–99)
Glucose-Capillary: 166 mg/dL — ABNORMAL HIGH (ref 70–99)
Glucose-Capillary: 134 mg/dL — ABNORMAL HIGH (ref 70–99)

## 2023-01-22 LAB — CBC
HCT: 21.8 % — ABNORMAL LOW (ref 36.0–46.0)
Hemoglobin: 7 g/dL — ABNORMAL LOW (ref 12.0–15.0)
MCH: 28.6 pg (ref 26.0–34.0)
MCHC: 32.1 g/dL (ref 30.0–36.0)
MCV: 89 fL (ref 80.0–100.0)
Platelets: 196 10*3/uL (ref 150–400)
RBC: 2.45 MIL/uL — ABNORMAL LOW (ref 3.87–5.11)
RDW: 13.7 % (ref 11.5–15.5)
WBC: 5.7 10*3/uL (ref 4.0–10.5)
nRBC: 0 % (ref 0.0–0.2)

## 2023-01-22 LAB — BASIC METABOLIC PANEL
Anion gap: 9 (ref 5–15)
BUN: 52 mg/dL — ABNORMAL HIGH (ref 8–23)
CO2: 21 mmol/L — ABNORMAL LOW (ref 22–32)
Calcium: 8.4 mg/dL — ABNORMAL LOW (ref 8.9–10.3)
Chloride: 111 mmol/L (ref 98–111)
Creatinine, Ser: 5.36 mg/dL — ABNORMAL HIGH (ref 0.44–1.00)
GFR, Estimated: 8 mL/min — ABNORMAL LOW (ref >60)
Glucose, Bld: 162 mg/dL — ABNORMAL HIGH (ref 70–99)
Potassium: 4.3 mmol/L (ref 3.5–5.1)
Sodium: 141 mmol/L (ref 135–145)

## 2023-01-22 MED ORDER — LORAZEPAM 2 MG/ML IJ SOLN
2.0000 mg | Freq: Once | INTRAMUSCULAR | Status: AC
  Administered 2023-01-23: 2 mg via INTRAMUSCULAR
  Filled 2023-01-22: qty 1

## 2023-01-22 MED ORDER — QUETIAPINE FUMARATE 25 MG PO TABS
25.0000 mg | ORAL_TABLET | Freq: Two times a day (BID) | ORAL | Status: DC | PRN
  Administered 2023-01-22 – 2023-01-26 (×6): 25 mg via ORAL
  Filled 2023-01-22 (×7): qty 1

## 2023-01-22 NOTE — Progress Notes (Signed)
Subjective:  UOP not recorded- supposedly weight went up from yest-  BP good-  kidney function stable,  Noted that she is refusing interventions this AM  Objective Vital signs in last 24 hours: Vitals:   01/21/23 1714 01/21/23 1937 01/22/23 0018 01/22/23 0428  BP: (!) 159/77 (!) 153/61 126/70 (!) 130/46  Pulse: 71 70 77 75  Resp: 17 20 20    Temp:  97.8 F (36.6 C) (!) 97.5 F (36.4 C) 98.1 F (36.7 C)  TempSrc:  Oral Oral Oral  SpO2: 99% 99% 94% 93%  Weight:    77.6 kg  Height:       Weight change:   Intake/Output Summary (Last 24 hours) at 01/22/2023 0805 Last data filed at 01/21/2023 2355 Gross per 24 hour  Intake 449.19 ml  Output --  Net 449.19 ml    Assessment/ Plan: Pt is a 72 y.o. yo female who was admitted on 01/21/2023 with anemia in the setting of stage 5 CKD  Assessment/Plan: 1. Renal-  noted to have stage 5 CKD at baseline-  kidney function not that much different.  No indications for dialysis and has been determined by 2 physicians at [facility] to be poor candidate for dialysis given non adherance and poor insight-  had also expressed to Dr. Marisue Humble that she did not desire dialysis.  The behavioral issues appear to be on display here in hospital as well.  Recommending more of a palliative approach  2. HTN/volume-  placed on IV lasix.  Dont have UOP or accurate weight so unclear how much impact it is having-  BP is down to relatively normal after being given her BP meds as well as lasix-  apparently not compliant as an OP  3. Anemia- is likely just a complication of CKD.  Would not adhere to an ESA treatment regimen.  Giving iron here today.  Unclear if she is symptomatic from an anemia standpoint   Cecille Aver    Labs: Basic Metabolic Panel: Recent Labs  Lab 01/21/23 1244 01/21/23 1543 01/22/23 0037  NA 142 141 141  K 5.3* 5.2* 4.3  CL 112* 110 111  CO2 21* 19* 21*  GLUCOSE 164* 137* 162*  BUN 54* 55* 52*  CREATININE 5.41* 5.25* 5.36*  CALCIUM 8.5*  8.7* 8.4*   Liver Function Tests: Recent Labs  Lab 01/21/23 1244  AST 16  ALT 13  ALKPHOS 52  BILITOT 0.5  PROT 5.7*  ALBUMIN 2.8*   No results for input(s): "LIPASE", "AMYLASE" in the last 168 hours. No results for input(s): "AMMONIA" in the last 168 hours. CBC: Recent Labs  Lab 01/21/23 1244 01/22/23 0037  WBC 5.3 5.7  HGB 7.1* 7.0*  HCT 23.0* 21.8*  MCV 90.9 89.0  PLT 212 196   Cardiac Enzymes: No results for input(s): "CKTOTAL", "CKMB", "CKMBINDEX", "TROPONINI" in the last 168 hours. CBG: Recent Labs  Lab 01/21/23 2139 01/22/23 0613  GLUCAP 148* 105*    Iron Studies:  Recent Labs    01/21/23 1543  IRON 42  TIBC 361  FERRITIN 16   Studies/Results: DG Chest 2 View  Result Date: 01/21/2023 CLINICAL DATA:  Chest pressure and drop in hemoglobin EXAM: CHEST - 2 VIEW COMPARISON:  Chest radiograph dated 12/26/2022 FINDINGS: Normal lung volumes. Increased bilateral interstitial opacities. New trace bilateral pleural effusions. No pneumothorax. The heart size and mediastinal contours are within normal limits. No acute osseous abnormality. IMPRESSION: 1. Increased bilateral interstitial opacities, likely pulmonary edema. 2. New trace bilateral pleural  effusions. Electronically Signed   By: Agustin Cree M.D.   On: 01/21/2023 13:28   Medications: Infusions:  ferric gluconate (FERRLECIT) IVPB Stopped (01/21/23 2355)    Scheduled Medications:  sodium chloride   Intravenous Once   amLODipine  10 mg Oral Daily   carvedilol  25 mg Oral BID WC   docusate sodium  100 mg Oral BID   furosemide  80 mg Intravenous Q12H   heparin  5,000 Units Subcutaneous Q8H   insulin aspart  0-9 Units Subcutaneous TID WC   isosorbide mononitrate  60 mg Oral Daily   pantoprazole  20 mg Oral BID   QUEtiapine  50 mg Oral QHS   rosuvastatin  40 mg Oral Daily   thiamine  100 mg Oral Daily    have reviewed scheduled and prn medications.  Physical Exam: Was resting in bed this AM-  given  recent notes of pt cursing at staff I did not wake  01/22/2023,8:05 AM  LOS: 0 days

## 2023-01-22 NOTE — Progress Notes (Signed)
Patient agitated cussing at staff. Refusing assessment.   MD notified,.

## 2023-01-22 NOTE — Consult Note (Signed)
[facility] Face-to-Face Psychiatry Consult   Reason for Consult: Noncompliant and mildly agitated. Referring Physician: Hospitalist. Patient Identification: Teresa Poole MRN:  [id number] Principal Diagnosis: Acute exacerbation of CHF (congestive heart failure) (HCC) Diagnosis:  Principal Problem:   Acute exacerbation of CHF (congestive heart failure) (HCC) Active Problems:   Hypertension   Type 2 diabetes mellitus with chronic kidney disease, without long-term current use of insulin (HCC)   Acute renal failure superimposed on stage 4 chronic kidney disease (HCC)   Anemia   Delirium due to another medical condition   Total Time spent with patient: 30 minutes  Subjective:   Teresa Poole is a 72 y.o. female patient admitted with  CKD 5 (I see at [facility]), hypertension, DM2, combined chronic systolic and diastolic heart failure, CAD, history of renal artery stenosis who presented to the hospital earlier today at the suggestion of PCP because of anemia.  Consult was for agitation and confusion.  HPI: Most of the information was primarily obtained from the records and patient interview.  The patient reports that she drove herself to the hospital because she was sick. Apparently she had shortness of breath and worsening edema.  She consented for treatment and was admitted to the medical floor.  Initially patient was noted to be fairly pleasant but records indicate that she has a history of noncompliance with medications.  She has not taken any of her home medications. Patient was on Seroquel which was held initially but subsequently because of increasing agitation it was restarted.    On assessment today: The patient was seen briefly twice.  Both times the patient presented as an alert, fairly pleasant 72 year old female who reports that this is about 3 months.  She states that she was born on 27-May-1951.  Which is accurate.  She states that she drove herself to the hospital because she needed  help.  She claims that she lives alone and is single and has never been married and has no children.  During the course of the conversation patient gradually escalated claiming that she was ready to go home and that she does not need to be here going to the bathroom and back to the bed in the hospital.  She knew that she was at [facility] and that she had some medical complications.  Please see formal mental status examination for additional information.  However the patient seems to present with symptoms of delirium with fluctuating alertness and periodic inattention, and disorganized thinking.  She does not appear to have hallucinations at this time but is periodically agitated.  She also has a pre-existing neurocognitive disorder.    Decision being assessed:ability to discharge In an evaluation of capacity, each of the following criteria must be met based for a patient to have capacity to make the decision in question.   Criterion 1: The patient demonstrates a clear and consistent voluntary choice with regard to treatment options. No Criterion 2: The patient adequately understands the disease they have, the treatment proposed, the risks of treatment, and the risks of other treatment (including no treatment). No Criterion 3: The patient acknowledges that the details of Criterion 2 apply to them specifically and the likely consequences of treatment options proposed. No Criterion 4: The patient demonstrates adequate reasoning/rationality within the context of their decision and can provide justification for their choice. No  In this case, the patient Teresa Poole DOES NOT have capacity to decide to discharge.   See patient interview below for  details. Of note, this capacity evaluation assesses only for the specified decision documented above at the time of the assessment and is not a substitute for determination of the patient's overall competency, which can only be adjudicated.    Mental  Capacity Assessment: I have evaluated the following areas to assess the Teresa Poole mental capacity regarding medical decision-making ability which pertains to competency to discharge.   The specific treatment or service in question is: History of mild neurocognitive disorder requesting discharge home vs SNF.  Communication: The patient was unable to clearly state preferred treatment options for her hospitalization and current recommendations per primary attending.  The patient also deflected multiple questions throughout the interview, with her main focus being on discharging home.  He does verbalize that he is homeless, and would like to be discharged to the streets versus returning to a shelter or SNF. Factors that could compromise this communication process include: Anemia, suspected history of cognitive impairment.     Understanding: The patient was unable to recall information, link causal relationships, and process general probabilities regarding life situations and medical treatment scenarios "Im just sittin ghere not doing anythign but pissing. I can go home and piss. Yall aint doing shit for me."  He was unable to paraphrase her view of the current situation and her thoughts about it.  She continues to ruminate about her heart and discharging home.  The patient did present with impairments in memory, attention span, or intelligence.   Appreciation: The patient was unable to identify and describe her various illnesses and treatment options with potential outcomes. The patient did not present with concerns such as denial or delusional thought-process.  Patient did deny previous psychiatric history.    Rationalization: The patient was unable to weigh risks and benefits and come to a conclusion congruent with patient's perceived goals. Concerns regarding this category are: depression, acute/chronic encephalopathy, alcohol withdrawal, cognitive delay, and traumatic brain injury,  In  conclusion, the patient is-not experiencing an acute medical scenario: ICH/SAH.  Conclusion: At this time, there is sufficient evidence to warrant removal of the patient's rights for medical decision-making as it pertains to discharging home.  She can NOT clearly determine mental capacity for decision-making unsure if this is secondary to TBI anemia and or pre-existing cognitive impairment.  At this time, we can determine that the patient does NOT have functional mental capacity for medical decision-making including the right to discharge home with no safe disposition.      Past Psychiatric History: This is primarily obtained from the records.  Patient appears to have a history of neurocognitive disorder and has been diagnosed with Alzheimer's dementia.  There is at least 1 incident in the records that patient has some behavioral consequences of dementia previously.  Risk to Self:  None noted Risk to Others:  Mild possibly related to agitation. Prior Inpatient Therapy:  None noted Prior Outpatient Therapy:  Seen by a neurologist before.  Past Medical History:  Past Medical History:  Diagnosis Date   Allergy    Anemia    Carotid stenosis    Chronic combined systolic and diastolic CHF (congestive heart failure) (HCC)    CKD (chronic kidney disease), stage IV (HCC) 09/24/2013   Pt at [facility], Dr. Marisue Humble   COVID-19 10/24/2021   Diabetes mellitus type 2 in nonobese [facility])    Edema 09/14/2013   GERD (gastroesophageal reflux disease)    History of ovarian cancer    Hyperlipidemia    Hypertension  Mild CAD    non-obstructive by LHC (09/25/2013): Proximal and mid LAD serial 20%, proximal circumflex 30%, mid AV groove circumflex 30%, mid RCA mild plaque.   NICM (nonischemic cardiomyopathy) (HCC)    Obesity (BMI 30-39.9)    RBBB    Renal artery stenosis (HCC) 10/11/2018   Thyroid disease    Seen by specialist    Past Surgical History:  Procedure Laterality Date   BIOPSY   12/22/2022   Procedure: BIOPSY;  Surgeon: Shellia Cleverly, DO;  Location: [facility] ENDOSCOPY;  Service: Gastroenterology;;   ESOPHAGOGASTRODUODENOSCOPY N/A 12/22/2022   Procedure: ESOPHAGOGASTRODUODENOSCOPY (EGD);  Surgeon: Shellia Cleverly, DO;  Location: [facility] ENDOSCOPY;  Service: Gastroenterology;  Laterality: N/A;   LEFT HEART CATHETERIZATION WITH CORONARY ANGIOGRAM N/A 09/25/2013   Procedure: LEFT HEART CATHETERIZATION WITH CORONARY ANGIOGRAM;  Surgeon: Kathleene Hazel, MD;  Location: [facility] CATH LAB;  Service: Cardiovascular;  Laterality: N/A;   RENAL ANGIOGRAPHY N/A 10/06/2018   Procedure: RENAL ANGIOGRAPHY;  Surgeon: Cephus Shelling, MD;  Location: [facility] INVASIVE CV LAB;  Service: Cardiovascular;  Laterality: N/A;   Family History:  Family History  Problem Relation Age of Onset   Diabetes Father    Hypertension Father    Hypertension Maternal Grandfather    Family Psychiatric  History: Unknown at this time.  Patient apparently has a sister but she is unavailable for additional history. Social History:  Social History   Substance and Sexual Activity  Alcohol Use No     Social History   Substance and Sexual Activity  Drug Use No    Social History   Socioeconomic History   Marital status: Single    Spouse name: Not on file   Number of children: 0   Years of education: Not on file   Highest education level: Not on file  Occupational History    Employer: [employer]  Tobacco Use   Smoking status: Never   Smokeless tobacco: Never  Vaping Use   Vaping Use: Never used  Substance and Sexual Activity   Alcohol use: No   Drug use: No   Sexual activity: Not on file  Other Topics Concern   Not on file  Social History Narrative   Works at [employer]   Patient lives at home alone.    Patient has no children.    Patient patient is right handed.    Patient is single.    Social Determinants of Health   Financial Resource Strain: Not on file  Food Insecurity: No Food  Insecurity (01/21/2023)   Hunger Vital Sign    Worried About Running Out of Food in the Last Year: Never true    Ran Out of Food in the Last Year: Never true  Transportation Needs: No Transportation Needs (01/21/2023)   PRAPARE - Administrator, Civil Service (Medical): No    Lack of Transportation (Non-Medical): No  Physical Activity: Not on file  Stress: Not on file  Social Connections: Not on file   Additional Social History:    Allergies:   Allergies  Allergen Reactions   Hydralazine Hcl Itching    ENTIRE BODY = burning sensation, also in the eyes (they have become very sensitive to light)   Atorvastatin Other (See Comments)    Per MD - pt not sure of reaction     Labs:  Results for orders placed or performed during the hospital encounter of 01/21/23 (from the past 48 hour(s))  Type and screen [facility]     Status: None (Preliminary result)   Collection Time: 01/21/23 12:34 PM  Result Value Ref Range   ABO/RH(D) O POS    Antibody Screen NEG    Sample Expiration      01/24/2023,2359 Performed at [facility], [address] [place], [place] [address]    Unit Number [id number]    Blood Component Type RED CELLS,LR    Unit division 00    Status of Unit ALLOCATED    Transfusion Status OK TO TRANSFUSE    Crossmatch Result Compatible   Comprehensive metabolic panel     Status: Abnormal   Collection Time: 01/21/23 12:44 PM  Result Value Ref Range   Sodium 142 135 - 145 mmol/L   Potassium 5.3 (H) 3.5 - 5.1 mmol/L   Chloride 112 (H) 98 - 111 mmol/L   CO2 21 (L) 22 - 32 mmol/L   Glucose, Bld 164 (H) 70 - 99 mg/dL    Comment: Glucose reference range applies only to samples taken after fasting for at least 8 hours.   BUN 54 (H) 8 - 23 mg/dL   Creatinine, Ser 5.62 (H) 0.44 - 1.00 mg/dL   Calcium 8.5 (L) 8.9 - 10.3 mg/dL   Total Protein 5.7 (L) 6.5 - 8.1 g/dL   Albumin 2.8 (L) 3.5 - 5.0 g/dL   AST 16 15 - 41 U/L   ALT 13 0 - 44 U/L    Alkaline Phosphatase 52 38 - 126 U/L   Total Bilirubin 0.5 0.3 - 1.2 mg/dL   GFR, Estimated 8 (L) >60 mL/min    Comment: (NOTE) Calculated using the CKD-EPI Creatinine Equation (2021)    Anion gap 9 5 - 15    Comment: Performed at [facility] Lab, [address] [place], [place] [address]  CBC     Status: Abnormal   Collection Time: 01/21/23 12:44 PM  Result Value Ref Range   WBC 5.3 4.0 - 10.5 K/uL   RBC 2.53 (L) 3.87 - 5.11 MIL/uL   Hemoglobin 7.1 (L) 12.0 - 15.0 g/dL   HCT 57.8 (L) 46.9 - 62.9 %   MCV 90.9 80.0 - 100.0 fL   MCH 28.1 26.0 - 34.0 pg   MCHC 30.9 30.0 - 36.0 g/dL   RDW 52.8 41.3 - 24.4 %   Platelets 212 150 - 400 K/uL   nRBC 0.0 0.0 - 0.2 %    Comment: Performed at [facility], [address] [place], [place] [address]  Troponin I (High Sensitivity)     Status: None   Collection Time: 01/21/23 12:44 PM  Result Value Ref Range   Troponin I (High Sensitivity) 16 <18 ng/L    Comment: (NOTE) Elevated high sensitivity troponin I (hsTnI) values and significant  changes across serial measurements may suggest ACS but many other  chronic and acute conditions are known to elevate hsTnI results.  Refer to the "Links" section for chest pain algorithms and additional  guidance. Performed at [facility], [address], [place], [place] [address]   POC occult blood, ED     Status: None   Collection Time: 01/21/23  2:47 PM  Result Value Ref Range   Fecal Occult Bld NEGATIVE NEGATIVE  Vitamin B12     Status: None   Collection Time: 01/21/23  3:43 PM  Result Value Ref Range   Vitamin B-12 471 180 - 914 pg/mL    Comment: (NOTE) This assay is not  validated for testing neonatal or myeloproliferative syndrome specimens for Vitamin B12 levels. Performed at [facility], [address] [place], [place] [address]   Iron and TIBC     Status: None   Collection Time: 01/21/23  3:43 PM  Result Value Ref Range   Iron 42 28 - 170 ug/dL   TIBC 604 540 -  981 ug/dL   Saturation Ratios 12 10.4 - 31.8 %   UIBC 319 ug/dL    Comment: Performed at [facility], [address], [place], [place] [address]  Ferritin     Status: None   Collection Time: 01/21/23  3:43 PM  Result Value Ref Range   Ferritin 16 11 - 307 ng/mL    Comment: Performed at [facility] Lab, [address], [place], [place] [address]  Basic metabolic panel     Status: Abnormal   Collection Time: 01/21/23  3:43 PM  Result Value Ref Range   Sodium 141 135 - 145 mmol/L   Potassium 5.2 (H) 3.5 - 5.1 mmol/L   Chloride 110 98 - 111 mmol/L   CO2 19 (L) 22 - 32 mmol/L   Glucose, Bld 137 (H) 70 - 99 mg/dL    Comment: Glucose reference range applies only to samples taken after fasting for at least 8 hours.   BUN 55 (H) 8 - 23 mg/dL   Creatinine, Ser 2.13 (H) 0.44 - 1.00 mg/dL   Calcium 8.7 (L) 8.9 - 10.3 mg/dL   GFR, Estimated 8 (L) >60 mL/min    Comment: (NOTE) Calculated using the CKD-EPI Creatinine Equation (2021)    Anion gap 12 5 - 15    Comment: Performed at [facility], [address] [place], [place] [address]  Magnesium     Status: None   Collection Time: 01/21/23  3:43 PM  Result Value Ref Range   Magnesium 1.8 1.7 - 2.4 mg/dL    Comment: Performed at [facility], [address] [place], [place] [address]  Troponin I (High Sensitivity)     Status: None   Collection Time: 01/21/23  3:43 PM  Result Value Ref Range   Troponin I (High Sensitivity) 17 <18 ng/L    Comment: (NOTE) Elevated high sensitivity troponin I (hsTnI) values and significant  changes across serial measurements may suggest ACS but many other  chronic and acute conditions are known to elevate hsTnI results.  Refer to the "Links" section for chest pain algorithms and additional  guidance. Performed at [facility] [place] [facility], [address] [place], [place] [address]   Folate     Status: None   Collection Time: 01/21/23  3:43 PM  Result Value Ref Range   Folate 18.0 >5.9  ng/mL    Comment: Performed at [facility], [address], [place], [place] [address]  Reticulocytes     Status: Abnormal   Collection Time: 01/21/23  6:08 PM  Result Value Ref Range   Retic Ct Pct 1.5 0.4 - 3.1 %   RBC. 2.77 (L) 3.87 - 5.11 MIL/uL   Retic Count, Absolute 41.8 19.0 - 186.0 K/uL   Immature Retic Fract 21.6 (H) 2.3 - 15.9 %    Comment: Performed at [facility] Lab, [address], [place], [place] [address]  Brain natriuretic peptide     Status: Abnormal   Collection Time: 01/21/23  6:08 PM  Result Value Ref Range   B Natriuretic Peptide 350.8 (H) 0.0 -  100.0 pg/mL    Comment: Performed at [facility], [address], [place], [place] [address]  Prepare RBC (crossmatch)     Status: None   Collection Time: 01/21/23  6:30 PM  Result Value Ref Range   Order Confirmation      ORDER PROCESSED BY BLOOD BANK Performed at [facility], [address], [place], [place] [address]   Glucose, capillary     Status: Abnormal   Collection Time: 01/21/23  9:39 PM  Result Value Ref Range   Glucose-Capillary 148 (H) 70 - 99 mg/dL    Comment: Glucose reference range applies only to samples taken after fasting for at least 8 hours.   Comment 1 Notify RN    Comment 2 Document in Chart   Basic metabolic panel     Status: Abnormal   Collection Time: 01/22/23 12:37 AM  Result Value Ref Range   Sodium 141 135 - 145 mmol/L   Potassium 4.3 3.5 - 5.1 mmol/L   Chloride 111 98 - 111 mmol/L   CO2 21 (L) 22 - 32 mmol/L   Glucose, Bld 162 (H) 70 - 99 mg/dL    Comment: Glucose reference range applies only to samples taken after fasting for at least 8 hours.   BUN 52 (H) 8 - 23 mg/dL   Creatinine, Ser 4.78 (H) 0.44 - 1.00 mg/dL   Calcium 8.4 (L) 8.9 - 10.3 mg/dL   GFR, Estimated 8 (L) >60 mL/min    Comment: (NOTE) Calculated using the CKD-EPI Creatinine Equation (2021)    Anion gap 9 5 - 15    Comment: Performed at [facility], [address]  [place], [place] [address]  CBC     Status: Abnormal   Collection Time: 01/22/23 12:37 AM  Result Value Ref Range   WBC 5.7 4.0 - 10.5 K/uL   RBC 2.45 (L) 3.87 - 5.11 MIL/uL   Hemoglobin 7.0 (L) 12.0 - 15.0 g/dL   HCT 13.0 (L) 86.5 - 78.4 %   MCV 89.0 80.0 - 100.0 fL   MCH 28.6 26.0 - 34.0 pg   MCHC 32.1 30.0 - 36.0 g/dL   RDW 69.6 29.5 - 28.4 %   Platelets 196 150 - 400 K/uL   nRBC 0.0 0.0 - 0.2 %    Comment: Performed at [facility] Lab, [address], [place], [place] [address]  Glucose, capillary     Status: Abnormal   Collection Time: 01/22/23  6:13 AM  Result Value Ref Range   Glucose-Capillary 105 (H) 70 - 99 mg/dL    Comment: Glucose reference range applies only to samples taken after fasting for at least 8 hours.   Comment 1 Notify RN    Comment 2 Document in Chart   Glucose, capillary     Status: Abnormal   Collection Time: 01/22/23 11:07 AM  Result Value Ref Range   Glucose-Capillary 166 (H) 70 - 99 mg/dL    Comment: Glucose reference range applies only to samples taken after fasting for at least 8 hours.   Comment 1 Notify RN     Current Facility-Administered Medications  Medication Dose Route Frequency Provider Last Rate Last Admin   0.9 %  sodium chloride infusion (Manually program via Guardrails IV Fluids)   Intravenous Once Burnadette Pop, MD       amLODipine (NORVASC) tablet 10 mg  10 mg Oral Daily Burnadette Pop, MD   10 mg at 01/22/23 [phone]  carvedilol (COREG) tablet 25 mg  25 mg Oral BID WC Adhikari, Amrit, MD   25 mg at 01/22/23 0916   docusate sodium (COLACE) capsule 100 mg  100 mg Oral BID Burnadette Pop, MD   100 mg at 01/22/23 0916   ferric gluconate (FERRLECIT) 250 mg in sodium chloride 0.9 % 250 mL IVPB  250 mg Intravenous Daily Sabra Heck B, MD 135 mL/hr at 01/22/23 0921 250 mg at 01/22/23 0921   furosemide (LASIX) injection 80 mg  80 mg Intravenous Q12H Burnadette Pop, MD   80 mg at 01/22/23 0616   heparin injection 5,000 Units  5,000 Units  Subcutaneous Q8H Adhikari, Amrit, MD   5,000 Units at 01/22/23 0616   insulin aspart (novoLOG) injection 0-9 Units  0-9 Units Subcutaneous TID WC Burnadette Pop, MD   2 Units at 01/22/23 1109   isosorbide mononitrate (IMDUR) 24 hr tablet 60 mg  60 mg Oral Daily Adhikari, Amrit, MD   60 mg at 01/22/23 0916   pantoprazole (PROTONIX) EC tablet 20 mg  20 mg Oral BID Burnadette Pop, MD   20 mg at 01/22/23 0916   QUEtiapine (SEROQUEL) tablet 25 mg  25 mg Oral BID PRN Burnadette Pop, MD       QUEtiapine (SEROQUEL) tablet 50 mg  50 mg Oral QHS Crosley, Debby, MD   50 mg at 01/21/23 2200   rosuvastatin (CRESTOR) tablet 40 mg  40 mg Oral Daily Burnadette Pop, MD   40 mg at 01/22/23 1610   thiamine (VITAMIN B1) tablet 100 mg  100 mg Oral Daily Burnadette Pop, MD   100 mg at 01/22/23 9604    Musculoskeletal: Strength & Muscle Tone:  Not tested patient was lying in bed. Gait & Station:  Not tested patient was lying in bed. Patient leans:  Not tested.            Psychiatric Specialty Exam:  Presentation  General Appearance:  Casual; Disheveled  Eye Contact: Fair  Speech: Clear and Coherent  Speech Volume: Normal  Handedness: Right   Mood and Affect  Mood: Anxious; Labile  Affect: Constricted   Thought Process  Thought Processes: Disorganized  Descriptions of Associations:Circumstantial  Orientation:Partial  Thought Content:Rumination; Perseveration; Scattered  History of Schizophrenia/Schizoaffective disorder:No data recorded Duration of Psychotic Symptoms:No data recorded Hallucinations:Hallucinations: None  Ideas of Reference:None  Suicidal Thoughts:Suicidal Thoughts: No  Homicidal Thoughts:Homicidal Thoughts: No   Sensorium  Memory: Immediate Poor; Recent Poor; Remote Poor  Judgment: Poor  Insight: Poor   Executive Functions  Concentration: Fair  Attention Span: Fair  Recall: Poor  Fund of  Knowledge: Fair  Language: Fair   Psychomotor Activity  Psychomotor Activity: Psychomotor Activity: Increased   Assets  Assets: Communication Skills   Sleep  Sleep: Sleep: Fair   Physical Exam: Physical Exam Vitals and nursing note reviewed.  Constitutional:      Appearance: Normal appearance. She is normal weight.  Skin:    Capillary Refill: Capillary refill takes less than 2 seconds.  Neurological:     General: No focal deficit present.     Mental Status: She is alert. Mental status is at baseline. She is disoriented.  Psychiatric:        Attention and Perception: Perception normal. She is inattentive.        Mood and Affect: Mood is anxious. Affect is labile.        Speech: Speech is rapid and pressured.        Behavior: Behavior is agitated.  Thought Content: Thought content normal.        Cognition and Memory: Cognition is impaired. Memory is impaired. She exhibits impaired recent memory and impaired remote memory.        Judgment: Judgment is impulsive.    Review of Systems  Psychiatric/Behavioral: Negative.    All other systems reviewed and are negative.  Blood pressure (!) 138/53, pulse 75, temperature 97.6 F (36.4 C), temperature source Oral, resp. rate 12, height 5\' 7"  (1.702 m), weight 77.6 kg, SpO2 100 %. Body mass index is 26.79 kg/m.  Treatment Plan Summary: Daily contact with patient to assess and evaluate symptoms and progress in treatment and Medication management   Impression: Mild delirium due to general medical condition with agitation. Mild to moderate neurocognitive disorder  Recommendations: - continue with stabilization of her medical condition including optimization of former metabolic syndrome. - consider light sedation.  Will increase Seroquel as tolerated to 25 mg twice a day and 50 mg at night. - need collateral from family members.  This is currently unavailable. -Initiate delirium precautions -Consider TOC referral  contact the License plate agency regarding license and possible dementia. -Patient lacks capacity at this time, considering reaching out to next of kin.  -Continue Seroquel at this time  A substitute decision maker must be sought to authorize medical intervention or to refuse treatment on behalf of the patient; for patients with advance directives, either the treatment choice that the patient made in advance or the choice of a surrogate decision maker may be indicated. In the absence of an advance directive and when time is available, the recommendation is usually to contact family members (the priority order to be approached is the spouse, adult children, parents, siblings, and other relatives).  Surrogate decision making for consent to make medical decisions for patient: The medical team has well-founded concerns regarding Ms. Hise's capacity to provide consent for treatment.   The medical team, together with the social work team, should by good faith attempt to find and contact any family or friends who may be able to make decisions on this patient's behalf.  While legal guardianship is ideal for the long-term benefit of a patient in this situation, there is no appointed guardian for Ms. Vonita Moss yet.    Psychiatry consult service to sign off at this time.  Thank you for this capacity consult.    It is worth noting that the IVC is the only legal mechansim by which incapacitated patients who require inpt medical attention can be compelled to remain in the hospital; presence of IVC alone does NOT mean that pt will ultimately require psychiatric hospitalization. This pt does not have an underlying psychiatric disturbance (outside of likely dementia) as far as I can tell.    Disposition: Psychiatric consult will sign off at this time.   Rex Kras, MD 01/22/2023 11:43 AM

## 2023-01-22 NOTE — Progress Notes (Signed)
Patient is confused regarding her care. She is demanding discharge.    Patient agitated regarding home medication regimen.   Verbal de-escalation was not successful.   Will notify the Hospitalist.

## 2023-01-22 NOTE — TOC Initial Note (Addendum)
Transition of Care (TOC) - Initial/Assessment Note  From home (apartment), has PCP and insurance on file, she has bp cuff and glucometer at home. Has no HH services and states she does not need HH services.  She states she drove herself here and will drive herself home at dc.  She gets meds from [facility] on [address] near [employer].  CHF ex, CKD5, nephrology following.   Patient Details  Name: Teresa Poole MRN: [id number] Date of Birth: 1951-07-25  Transition of Care [facility]) CM/SW Contact:    Leone Haven, RN Phone Number: 01/22/2023, 2:39 PM  Clinical Narrative:                         Patient Goals and CMS Choice            Expected Discharge Plan and Services                                              Prior Living Arrangements/Services                       Activities of Daily Living Home Assistive Devices/Equipment: None ADL Screening (condition at time of admission) Patient's cognitive ability adequate to safely complete daily activities?: Yes Is the patient deaf or have difficulty hearing?: No Does the patient have difficulty seeing, even when wearing glasses/contacts?: No Does the patient have difficulty concentrating, remembering, or making decisions?: No Patient able to express need for assistance with ADLs?: Yes Does the patient have difficulty dressing or bathing?: Yes Independently performs ADLs?: Yes (appropriate for developmental age) Does the patient have difficulty walking or climbing stairs?: Yes Weakness of Legs: None Weakness of Arms/Hands: None  Permission Sought/Granted                  Emotional Assessment              Admission diagnosis:  Acute exacerbation of CHF (congestive heart failure) (HCC) [I50.9] Acute on chronic congestive heart failure, unspecified heart failure type (HCC) [I50.9] Patient Active Problem List   Diagnosis Date Noted   Delirium due to another medical condition  01/22/2023   Acute exacerbation of CHF (congestive heart failure) (HCC) 01/21/2023   Acute on chronic anemia 12/22/2022   Melena 12/21/2022   QT prolongation 12/21/2022   Elevated troponin I level 11/10/2022   Hypertensive emergency 11/09/2022   Demand ischemia 11/09/2022   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) 11/07/2022   Acute kidney injury (HCC) 11/05/2022   Dizziness 04/17/2022   (HFpEF) heart failure with preserved ejection fraction (HCC) 02/28/2022   Hypomagnesemia 02/28/2022   Manic episode, unspecified (HCC)    COVID-19 virus infection 10/25/2021   UTI (urinary tract infection) 10/25/2021   Anemia 10/25/2021   Suicidal ideations 10/17/2021   Acute renal failure superimposed on stage 4 chronic kidney disease (HCC) 10/14/2021   ARF (acute renal failure) (HCC) 10/14/2021   Small bowel obstruction (HCC)    SBO (small bowel obstruction) (HCC) 09/22/2021   Adjustment disorder with anxiety 06/10/2021   Atypical chest pain 11/27/2018   Renal artery stenosis (HCC) 10/11/2018   Hyperlipidemia associated with type 2 diabetes mellitus (HCC) 10/26/2013   Chronic systolic heart failure (HCC) 09/28/2013   CKD (chronic kidney disease), stage IV (HCC) 09/24/2013  Hypertensive urgency 09/18/2013   Cardiomyopathy, hypertensive (HCC) 09/18/2013   Edema 09/14/2013   Acute systolic heart failure (HCC) 09/09/2013   Hypertensive heart disease with CHF (congestive heart failure) (HCC) 09/09/2013   Hypertension 09/07/2013   Type 2 diabetes mellitus with chronic kidney disease, without long-term current use of insulin (HCC) 09/07/2013   Obesity (BMI 30-39.9)    History of ovarian cancer    PCP:  Malka So., MD Pharmacy:   [facility] [phone], [place] - [address] AT [facility] [address] [place] [address] Phone: [phone] Fax: [phone]     Social Determinants of  Health (SDOH) Social History: SDOH Screenings   Food Insecurity: No Food Insecurity (01/21/2023)  Housing: Patient Declined (01/21/2023)  Transportation Needs: No Transportation Needs (01/21/2023)  Utilities: Not At Risk (01/21/2023)  Tobacco Use: Low Risk  (01/21/2023)   SDOH Interventions:     Readmission Risk Interventions    12/22/2022    2:40 PM 10/28/2021    2:56 PM  Readmission Risk Prevention Plan  Transportation Screening Complete Complete  Medication Review (RN Care Manager) Referral to Pharmacy Complete  PCP or Specialist appointment within 3-5 days of discharge Complete Complete  HRI or Home Care Consult Complete Complete  SW Recovery Care/Counseling Consult Complete Complete  Palliative Care Screening Not Applicable Not Applicable  Skilled Nursing Facility Not Applicable Not Applicable

## 2023-01-22 NOTE — Progress Notes (Addendum)
Patient complains of "pressure in my R/ear and neck".   Patient offered pain medication, she refused. Refused assessment.

## 2023-01-22 NOTE — Progress Notes (Signed)
   01/22/23 1318  Vitals  BP (!) 132/58  MAP (mmHg) 80  ECG Heart Rate 64  MEWS Score  MEWS Temp 0  MEWS Systolic 0  MEWS Pulse 0  MEWS RR 1  MEWS LOC 0  MEWS Score 1  MEWS Score Color Green     Patient called out asking for her BP to be assessed. She states, " I have severe blood pressure and no one has checked it".  Patient reassured, BP reassessed.

## 2023-01-22 NOTE — Progress Notes (Signed)
Pt has become increasingly agitated and refusing to stay in her room, she has been pacing the halls and taking off her telemetry monitor She was observed to be in her home clothes and stating that she is leaving the hospital Because we have not been giving her any meds or food I have attempted to try to calm her however she stated she was leaving because she came voluntarily and she wants to leave voluntarily She began to take off telemetry monitor again And pulled out IV site She started to get redressed again Security was called  Charge Nurse was notified to see if patient is supposed to be an IVC  I was told via Dayshift RN that the patient was not deemed mentally and medically stable enough to leave AMA and that security was needed to be called to keep patient safe I am awaiting further orders and info regarding whether patient is able to leave or not

## 2023-01-22 NOTE — Progress Notes (Signed)
PROGRESS NOTE  Teresa Poole  ZOX:[id number] DOB: 1950-10-03 DOA: 01/21/2023 PCP: Malka So., MD   Brief Narrative: Teresa Poole is a 72 y.o. female with medical history significant of combined systolic/diastolic CHF, CKD stage IV, hypertension, diabetes type 2, coronary artery  disease, obesity, renal artery stenosis who presented to the emergency department as per suggestion of her PCP for the evaluation low hemoglobin. Patient also mentioned that she has been short of breath and tired for last 1 month.  Patient has been noticing lower extremity edema more than usual.  On condition she was found to be volume overloaded and worsened kidney function.  Nephrology consulted.  Currently on IV Lasix.  Patient also has cognitive impairment and frequently wants to leave, does not understand her medical problems so psychiatry consulted for determination of capacity  Assessment & Plan:  Principal Problem:   Acute exacerbation of CHF (congestive heart failure) (HCC) Active Problems:   Acute renal failure superimposed on stage 4 chronic kidney disease (HCC)   Hypertension   Type 2 diabetes mellitus with chronic kidney disease, without long-term current use of insulin (HCC)   Anemia  Acute on chronic combined systolic/diastolic CHF: Appears volume overloaded on presentation with bilateral lower extremity swelling.  Chest x-ray showed features of pulmonary edema, trace bilateral pleural effusion.  This is mostly secondary to cardiorenal syndrome due to her worsening kidney function.elevated BNP.  Patient does not take any diuretics at home.  She is noncompliant.Started on IV diuresis.  Continue to monitor daily input/output, weight Last echo has shown EF of 45 to 50% grade 2 diastolic dysfunction.   AKI in CKD stage V: Baseline creatinine usually  in the range of 4.  Patient presented with creatinine in the range of 5.  She follows with nephrology, Dr. Marisue Humble.  Nephrology consulted . She   also has history of renal artery stenosis. As per nephrology,she is not a candidate for dialysis because of her cognitive issues/possible lack of capacity, noncompliance   Normocytic anemia: Hemoglobin was found to be in the range of 7.  Baseline hemoglobin is from 8-9.  No evidence of acute blood loss.  No report of hematochezia or melena.  This is most likely secondary to her progressive CKD.  Patient was admitted for the same in April.  At that time GI was consulted and she underwent EGD with finding of Barrett's esophagus, no active bleeding, recommended to continue Protonix 20 mg twice daily at that time. Patient declines blood transfusion, hemoglobin in the range of 7 today.  Being given IV iron.   History of coronary artery disease: Found to have nonobstructive coronary disease as per cardiac cath on 2015   Diabetes type 2: Takes Ozempic at home.  Continue sliding scale insulin here.  Monitor blood sugars   Hypertension: Presented with hypertension.  Takes Imdur, lisinopril, amlodipine, Coreg at home.  Will hold lisinopril due to AKI.   History of memory impairment/adjustment disorder with anxiety: On Seroquel as per the records.  As per sister, she is noncompliant and she does not take any medications at home most of the time.  Patient does not have insight of her medical problems.  We requested psychiatry assistance. Patient is still driving her car  History of OSA: Not on CPAP   Hyperlipidemia: On statin             DVT prophylaxis:heparin injection 5,000 Units Start: 01/22/23 0600     Code Status: Full Code  Family Communication: Called  and discussed with sister on phone on 5/30  Patient status: Inpatient  Patient is from : Home  Anticipated discharge to: Home  Estimated DC date: 2 to 3 days   Consultants: Nephrology, psychiatry  Procedures: None yet  Antimicrobials:  Anti-infectives (From admission, onward)    None       Subjective: Patient seen and  examined at bedside today.  During evaluation, she was comfortable.  Sitting at the edge of the bed.  As per the report this morning, she wanted to leave and was declining medications but after counseling at bedside, she agreed to take her medications.  Long discussion again held at the bedside with the patient.  I tried to explain to her about the necessity of staying in the hospital, importance of taking the medication that we provide here.  She then said  has agreed to stay but patient continues to demonstrate lack of capacity and judgment of her medical problems  Objective: Vitals:   01/22/23 0018 01/22/23 0428 01/22/23 0916 01/22/23 1110  BP: 126/70 (!) 130/46 (!) 160/56 (!) 138/53  Pulse: 77 75    Resp: 20   12  Temp: (!) 97.5 F (36.4 C) 98.1 F (36.7 C) 97.6 F (36.4 C)   TempSrc: Oral Oral Oral   SpO2: 94% 93% 100% 100%  Weight:  77.6 kg    Height:        Intake/Output Summary (Last 24 hours) at 01/22/2023 1123 Last data filed at 01/21/2023 2355 Gross per 24 hour  Intake 449.19 ml  Output --  Net 449.19 ml   Filed Weights   01/21/23 1225 01/22/23 0428  Weight: 72 kg 77.6 kg    Examination:  General exam: Overall comfortable, not in distress HEENT: PERRL Respiratory system:  no wheezes or crackles  Cardiovascular system: S1 & S2 heard, RRR.  Gastrointestinal system: Abdomen is nondistended, soft and nontender. Central nervous system: Alert and awake, oriented to place.  Tells correct month but not day Extremities: Lower extremity pitting edema, no clubbing ,no cyanosis Skin: No rashes, no ulcers,no icterus     Data Reviewed: I have personally reviewed following labs and imaging studies  CBC: Recent Labs  Lab 01/21/23 1244 01/22/23 0037  WBC 5.3 5.7  HGB 7.1* 7.0*  HCT 23.0* 21.8*  MCV 90.9 89.0  PLT 212 196   Basic Metabolic Panel: Recent Labs  Lab 01/21/23 1244 01/21/23 1543 01/22/23 0037  NA 142 141 141  K 5.3* 5.2* 4.3  CL 112* 110 111  CO2 21*  19* 21*  GLUCOSE 164* 137* 162*  BUN 54* 55* 52*  CREATININE 5.41* 5.25* 5.36*  CALCIUM 8.5* 8.7* 8.4*  MG  --  1.8  --      No results found for this or any previous visit (from the past 240 hour(s)).   Radiology Studies: DG Chest 2 View  Result Date: 01/21/2023 CLINICAL DATA:  Chest pressure and drop in hemoglobin EXAM: CHEST - 2 VIEW COMPARISON:  Chest radiograph dated 12/26/2022 FINDINGS: Normal lung volumes. Increased bilateral interstitial opacities. New trace bilateral pleural effusions. No pneumothorax. The heart size and mediastinal contours are within normal limits. No acute osseous abnormality. IMPRESSION: 1. Increased bilateral interstitial opacities, likely pulmonary edema. 2. New trace bilateral pleural effusions. Electronically Signed   By: Agustin Cree M.D.   On: 01/21/2023 13:28    Scheduled Meds:  sodium chloride   Intravenous Once   amLODipine  10 mg Oral Daily   carvedilol  25 mg  Oral BID WC   docusate sodium  100 mg Oral BID   furosemide  80 mg Intravenous Q12H   heparin  5,000 Units Subcutaneous Q8H   insulin aspart  0-9 Units Subcutaneous TID WC   isosorbide mononitrate  60 mg Oral Daily   pantoprazole  20 mg Oral BID   QUEtiapine  50 mg Oral QHS   rosuvastatin  40 mg Oral Daily   thiamine  100 mg Oral Daily   Continuous Infusions:  ferric gluconate (FERRLECIT) IVPB 250 mg (01/22/23 0921)     LOS: 0 days   Burnadette Pop, MD [facility] P5/31/2024, 11:23 AM

## 2023-01-23 DIAGNOSIS — I5043 Acute on chronic combined systolic (congestive) and diastolic (congestive) heart failure: Secondary | ICD-10-CM | POA: Diagnosis not present

## 2023-01-23 LAB — CBC
HCT: 22.8 % — ABNORMAL LOW (ref 36.0–46.0)
Hemoglobin: 7.3 g/dL — ABNORMAL LOW (ref 12.0–15.0)
MCH: 28.4 pg (ref 26.0–34.0)
MCHC: 32 g/dL (ref 30.0–36.0)
MCV: 88.7 fL (ref 80.0–100.0)
Platelets: 209 10*3/uL (ref 150–400)
RBC: 2.57 MIL/uL — ABNORMAL LOW (ref 3.87–5.11)
RDW: 13.9 % (ref 11.5–15.5)
WBC: 5.6 10*3/uL (ref 4.0–10.5)
nRBC: 0 % (ref 0.0–0.2)

## 2023-01-23 LAB — BASIC METABOLIC PANEL
Anion gap: 9 (ref 5–15)
BUN: 57 mg/dL — ABNORMAL HIGH (ref 8–23)
CO2: 24 mmol/L (ref 22–32)
Calcium: 8.2 mg/dL — ABNORMAL LOW (ref 8.9–10.3)
Chloride: 109 mmol/L (ref 98–111)
Creatinine, Ser: 5.61 mg/dL — ABNORMAL HIGH (ref 0.44–1.00)
GFR, Estimated: 8 mL/min — ABNORMAL LOW (ref >60)
Glucose, Bld: 193 mg/dL — ABNORMAL HIGH (ref 70–99)
Potassium: 4.6 mmol/L (ref 3.5–5.1)
Sodium: 142 mmol/L (ref 135–145)

## 2023-01-23 LAB — GLUCOSE, CAPILLARY
Glucose-Capillary: 92 mg/dL (ref 70–99)
Glucose-Capillary: 187 mg/dL — ABNORMAL HIGH (ref 70–99)
Glucose-Capillary: 102 mg/dL — ABNORMAL HIGH (ref 70–99)

## 2023-01-23 MED ORDER — LORAZEPAM 2 MG/ML IJ SOLN: 1.0000 mg | Freq: Once | INTRAMUSCULAR | Status: DC

## 2023-01-23 MED ORDER — DARBEPOETIN ALFA 300 MCG/0.6ML IJ SOSY
300.0000 ug | PREFILLED_SYRINGE | Freq: Once | INTRAMUSCULAR | Status: AC
  Administered 2023-01-23: 300 ug via SUBCUTANEOUS
  Filled 2023-01-23: qty 0.6

## 2023-01-23 MED ORDER — LORAZEPAM 2 MG/ML IJ SOLN
2.0000 mg | Freq: Once | INTRAMUSCULAR | Status: AC
  Administered 2023-01-25: 2 mg via INTRAMUSCULAR
  Filled 2023-01-23: qty 1

## 2023-01-23 NOTE — Progress Notes (Signed)
Pt has awakened and is refusing all care She is yelling and screaming that she wants to be released to from this hospital She has refused telemetry Ripped off the BP cuff Refused to allow blood sugar check Pt has one time order for Ativan IM that I will be giving to her shortly for the increased agitation and aggressiveness 1:1 sitter still at the bedside

## 2023-01-23 NOTE — Progress Notes (Signed)
Subjective:  UOP not recorded-   kidney function basically stable,  Noted that she is agitated, psych declared her incompetent -  I really dont know where this leaves [place].  She is somnolent this AM-  repeats words from questions that I ask and goes off on tangents  Objective Vital signs in last 24 hours: Vitals:   01/22/23 1110 01/22/23 1318 01/22/23 1622 01/22/23 1957  BP: (!) 138/53 (!) 132/58 (!) 151/74 (!) 151/59  Pulse:   69 (!) 57  Resp: 12  15 16   Temp: 97.6 F (36.4 C)  98.1 F (36.7 C) 97.9 F (36.6 C)  TempSrc: Oral  Oral Oral  SpO2: 100%  98% 98%  Weight:      Height:       Weight change:   Intake/Output Summary (Last 24 hours) at 01/23/2023 0809 Last data filed at 01/22/2023 1300 Gross per 24 hour  Intake 270 ml  Output --  Net 270 ml    Assessment/ Plan: Pt is a 72 y.o. yo female who was admitted on 01/21/2023 with anemia in the setting of stage 5 CKD  Assessment/Plan: 1. Renal-  noted to have stage 5 CKD at baseline-  kidney function not that much different.  No indications for dialysis and has been determined by now 3 physicians at [facility] to be not a candidate for dialysis given non adherance and poor insight accompanied by angry behavior-  had also expressed to Dr. Marisue Humble that she did not desire dialysis.  The behavioral issues appear to be on display here in hospital as well.  Recommending more of a palliative approach as I feel the odds are low that she will cooperate with any other approach 2. HTN/volume-  placed on IV lasix.  Dont have UOP or accurate weight so unclear how much impact it is having-  BP is down to relatively normal after being given her BP meds as well as lasix-  apparently not compliant as an OP.  Volume status not terrible today  3. Anemia- is likely just a complication of CKD.  Would not adhere to an ESA treatment regimen.  Giving iron here but now no IV.  Unclear if she is symptomatic from an anemia standpoint.  I can give esa today but wont be  effective unless she gets routinely -  I guess er could give with her weekly er visits  This is a terrible situation.  She does not have indications for dialysis AND she is not a candidate for dialysis in the opinion of [facility].  I am not sure what the end point is-   maybe could let her leave- she will likely come back to ER within the week if her patterns continue.  Renal will sign off   Cecille Aver    Labs: Basic Metabolic Panel: Recent Labs  Lab 01/21/23 1543 01/22/23 0037 01/23/23 0035  NA 141 141 142  K 5.2* 4.3 4.6  CL 110 111 109  CO2 19* 21* 24  GLUCOSE 137* 162* 193*  BUN 55* 52* 57*  CREATININE 5.25* 5.36* 5.61*  CALCIUM 8.7* 8.4* 8.2*   Liver Function Tests: Recent Labs  Lab 01/21/23 1244  AST 16  ALT 13  ALKPHOS 52  BILITOT 0.5  PROT 5.7*  ALBUMIN 2.8*   No results for input(s): "LIPASE", "AMYLASE" in the last 168 hours. No results for input(s): "AMMONIA" in the last 168 hours. CBC: Recent Labs  Lab 01/21/23 1244 01/22/23 0037 01/23/23 0035  WBC 5.3  5.7 5.6  HGB 7.1* 7.0* 7.3*  HCT 23.0* 21.8* 22.8*  MCV 90.9 89.0 88.7  PLT 212 196 209   Cardiac Enzymes: No results for input(s): "CKTOTAL", "CKMB", "CKMBINDEX", "TROPONINI" in the last 168 hours. CBG: Recent Labs  Lab 01/21/23 2139 01/22/23 0613 01/22/23 1107 01/22/23 1625  GLUCAP 148* 105* 166* 134*    Iron Studies:  Recent Labs    01/21/23 1543  IRON 42  TIBC 361  FERRITIN 16   Studies/Results: DG Chest 2 View  Result Date: 01/21/2023 CLINICAL DATA:  Chest pressure and drop in hemoglobin EXAM: CHEST - 2 VIEW COMPARISON:  Chest radiograph dated 12/26/2022 FINDINGS: Normal lung volumes. Increased bilateral interstitial opacities. New trace bilateral pleural effusions. No pneumothorax. The heart size and mediastinal contours are within normal limits. No acute osseous abnormality. IMPRESSION: 1. Increased bilateral interstitial opacities, likely pulmonary edema. 2. New trace  bilateral pleural effusions. Electronically Signed   By: Agustin Cree M.D.   On: 01/21/2023 13:28   Medications: Infusions:  ferric gluconate (FERRLECIT) IVPB Stopped (01/22/23 1123)    Scheduled Medications:  sodium chloride   Intravenous Once   amLODipine  10 mg Oral Daily   carvedilol  25 mg Oral BID WC   docusate sodium  100 mg Oral BID   furosemide  80 mg Intravenous Q12H   heparin  5,000 Units Subcutaneous Q8H   insulin aspart  0-9 Units Subcutaneous TID WC   isosorbide mononitrate  60 mg Oral Daily   pantoprazole  20 mg Oral BID   QUEtiapine  50 mg Oral QHS   rosuvastatin  40 mg Oral Daily   thiamine  100 mg Oral Daily    have reviewed scheduled and prn medications.  Physical Exam: Was resting in bed this AM-  did wake-  she repeats parts of questions that I ask her with some tangential thoughts  01/23/2023,8:09 AM  LOS: 0 days
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
## 2023-01-23 NOTE — Progress Notes (Signed)
Patient is more oriented and able to follow some commands, able to allow RN to assess and complete some care. She verbalizes in agreement for staffs to restart PIV. IV team consulted.

## 2023-01-23 NOTE — Progress Notes (Signed)
Contacted pt's sister Simonne Maffucci) at [phone] to discuss the DC plan. Left a VM. Will continue to f/u to assist with the DC plan.

## 2023-01-23 NOTE — Progress Notes (Signed)
Pt has been placed on IVC protocol and has as 1:1sitter at the bedside, She has removed her IV site earlier and I will attempt to replace the site if the patient will allow it She has refused Telemetry and I have spoken with Telemetry monitoring to advise them to place the monitoring on standby The patient has been sleeping well since sitter arrived and no behaviors/incidents to note at this time I will reattempt to place the patient on telemetry as well No s/s or c/o sob/dyspnea to note No s/s or c/o pain or discomfort to note I will continue to monitor for changes in status and will note changes accordingly

## 2023-01-23 NOTE — Progress Notes (Signed)
Patient appears to be a more confused  and agitated at this time, requesting to go home. PRN seroquel given. Safety sitter remains at bedside,   POC: Will round frequently with emotional support and reassurance.

## 2023-01-23 NOTE — Progress Notes (Signed)
Pt refusing medications, states she wants her IV out and wants to go home.  This RN explained that the MD has to release her and the MD is not available to do so at this time.  Pt adamantly wanting IV out, but seems to be agreeable to leave it in at this time.  Dr. Joneen Roach notified of pt refusals and behavior at this time.

## 2023-01-24 DIAGNOSIS — E1122 Type 2 diabetes mellitus with diabetic chronic kidney disease: Secondary | ICD-10-CM | POA: Diagnosis present

## 2023-01-24 DIAGNOSIS — I132 Hypertensive heart and chronic kidney disease with heart failure and with stage 5 chronic kidney disease, or end stage renal disease: Secondary | ICD-10-CM | POA: Diagnosis present

## 2023-01-24 DIAGNOSIS — Z91148 Patient's other noncompliance with medication regimen for other reason: Secondary | ICD-10-CM | POA: Diagnosis not present

## 2023-01-24 DIAGNOSIS — I5043 Acute on chronic combined systolic (congestive) and diastolic (congestive) heart failure: Secondary | ICD-10-CM | POA: Diagnosis present

## 2023-01-24 DIAGNOSIS — Z66 Do not resuscitate: Secondary | ICD-10-CM | POA: Diagnosis present

## 2023-01-24 DIAGNOSIS — D509 Iron deficiency anemia, unspecified: Secondary | ICD-10-CM | POA: Diagnosis present

## 2023-01-24 DIAGNOSIS — D631 Anemia in chronic kidney disease: Secondary | ICD-10-CM | POA: Diagnosis present

## 2023-01-24 DIAGNOSIS — E875 Hyperkalemia: Secondary | ICD-10-CM | POA: Diagnosis present

## 2023-01-24 DIAGNOSIS — I428 Other cardiomyopathies: Secondary | ICD-10-CM | POA: Diagnosis present

## 2023-01-24 DIAGNOSIS — F05 Delirium due to known physiological condition: Secondary | ICD-10-CM | POA: Diagnosis present

## 2023-01-24 DIAGNOSIS — Z781 Physical restraint status: Secondary | ICD-10-CM | POA: Diagnosis not present

## 2023-01-24 DIAGNOSIS — Z8543 Personal history of malignant neoplasm of ovary: Secondary | ICD-10-CM | POA: Diagnosis not present

## 2023-01-24 DIAGNOSIS — Z7189 Other specified counseling: Secondary | ICD-10-CM | POA: Diagnosis not present

## 2023-01-24 DIAGNOSIS — I4510 Unspecified right bundle-branch block: Secondary | ICD-10-CM | POA: Diagnosis present

## 2023-01-24 DIAGNOSIS — N185 Chronic kidney disease, stage 5: Secondary | ICD-10-CM | POA: Diagnosis present

## 2023-01-24 DIAGNOSIS — I2510 Atherosclerotic heart disease of native coronary artery without angina pectoris: Secondary | ICD-10-CM | POA: Diagnosis present

## 2023-01-24 DIAGNOSIS — N179 Acute kidney failure, unspecified: Secondary | ICD-10-CM | POA: Diagnosis present

## 2023-01-24 DIAGNOSIS — Z515 Encounter for palliative care: Secondary | ICD-10-CM | POA: Diagnosis not present

## 2023-01-24 DIAGNOSIS — E785 Hyperlipidemia, unspecified: Secondary | ICD-10-CM | POA: Diagnosis present

## 2023-01-24 DIAGNOSIS — Z7982 Long term (current) use of aspirin: Secondary | ICD-10-CM | POA: Diagnosis not present

## 2023-01-24 DIAGNOSIS — Z79899 Other long term (current) drug therapy: Secondary | ICD-10-CM | POA: Diagnosis not present

## 2023-01-24 DIAGNOSIS — K219 Gastro-esophageal reflux disease without esophagitis: Secondary | ICD-10-CM | POA: Diagnosis present

## 2023-01-24 DIAGNOSIS — G3184 Mild cognitive impairment, so stated: Secondary | ICD-10-CM | POA: Diagnosis not present

## 2023-01-24 DIAGNOSIS — Z5900 Homelessness unspecified: Secondary | ICD-10-CM | POA: Diagnosis not present

## 2023-01-24 DIAGNOSIS — G309 Alzheimer's disease, unspecified: Secondary | ICD-10-CM | POA: Diagnosis present

## 2023-01-24 DIAGNOSIS — Z8616 Personal history of COVID-19: Secondary | ICD-10-CM | POA: Diagnosis not present

## 2023-01-24 DIAGNOSIS — I509 Heart failure, unspecified: Secondary | ICD-10-CM | POA: Diagnosis present

## 2023-01-24 DIAGNOSIS — F0280 Dementia in other diseases classified elsewhere without behavioral disturbance: Secondary | ICD-10-CM | POA: Diagnosis present

## 2023-01-24 LAB — GLUCOSE, CAPILLARY
Glucose-Capillary: 94 mg/dL (ref 70–99)
Glucose-Capillary: 144 mg/dL — ABNORMAL HIGH (ref 70–99)
Glucose-Capillary: 140 mg/dL — ABNORMAL HIGH (ref 70–99)

## 2023-01-24 MED ORDER — FUROSEMIDE 40 MG PO TABS
80.0000 mg | ORAL_TABLET | Freq: Two times a day (BID) | ORAL | Status: DC
  Administered 2023-01-24 (×2): 80 mg via ORAL
  Filled 2023-01-24 (×2): qty 2

## 2023-01-24 NOTE — Progress Notes (Signed)
Patient noted with agitation requesting to home. She is a/ox4, does appears to be forgetful, expressing understanding why she needs to be here at the hospital, but continue to exhibit distress of being in the hospital.   RN POC: Continue with safety sitter and frequent reassurance.
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
## 2023-01-24 NOTE — Evaluation (Signed)
Occupational Therapy Evaluation Patient Details Name: Teresa Poole MRN: [id number] DOB: [birth year] Today's Date: 01/24/2023   History of Present Illness Pt is a 72 y/o F presenting to ED on 5/30 from PCP with low hgb and SOB x1 month, found to be voume overloaded with worsening kidney function, admitted for acute CHF exacerbation. PMH includes CHF, CKD IV, HTN, DM2   Clinical Impression   Pt reports independence at baseline with ADLs and functional mobility, lives alone in [place] with 2 flights of stairs to enter. Pt currently needing supervision-mod I for ADLs, and supervision for transfers without AD. Pt with mild impulsivity and decreased cognition, decreased awareness of condition and why she is in the hospital, perseverates on wanting to leave throughout session. Would benefit from CHF/energy conservation strategies in future session. Pt presenting with impairments listed below, will follow acutely to maximize safety and independence with ADLs/functional mobility.     Recommendations for follow up therapy are one component of a multi-disciplinary discharge planning process, led by the attending physician.  Recommendations may be updated based on patient status, additional functional criteria and insurance authorization.   Assistance Recommended at Discharge Set up Supervision/Assistance  Patient can return home with the following Assistance with cooking/housework;Direct supervision/assist for medications management    Functional Status Assessment  Patient has had a recent decline in their functional status and demonstrates the ability to make significant improvements in function in a reasonable and predictable amount of time.  Equipment Recommendations  None recommended by OT    Recommendations for Other Services       Precautions / Restrictions Precautions Precaution Comments: safety sitter Restrictions Weight Bearing Restrictions: No      Mobility Bed Mobility                General bed mobility comments: seated EOB upon arrival and departure    Transfers Overall transfer level: Needs assistance Equipment used: None Transfers: Sit to/from Stand Sit to Stand: Supervision                  Balance Overall balance assessment: No apparent balance deficits (not formally assessed)                                         ADL either performed or assessed with clinical judgement   ADL Overall ADL's : Needs assistance/impaired Eating/Feeding: Modified independent;Supervision/ safety   Grooming: Supervision/safety   Upper Body Bathing: Supervision/ safety   Lower Body Bathing: Supervison/ safety   Upper Body Dressing : Supervision/safety   Lower Body Dressing: Supervision/safety   Toilet Transfer: Supervision/safety           Functional mobility during ADLs: Supervision/safety       Vision   Vision Assessment?: No apparent visual deficits     Perception Perception Perception Tested?: No   Praxis Praxis Praxis tested?: Not tested    Pertinent Vitals/Pain Pain Assessment Pain Assessment: No/denies pain     Hand Dominance Right   Extremity/Trunk Assessment Upper Extremity Assessment Upper Extremity Assessment: Overall WFL for tasks assessed   Lower Extremity Assessment Lower Extremity Assessment: Overall WFL for tasks assessed   Cervical / Trunk Assessment Cervical / Trunk Assessment: Normal   Communication Communication Communication: No difficulties   Cognition Arousal/Alertness: Awake/alert Behavior During Therapy: Agitated, Impulsive Overall Cognitive Status: History of cognitive impairments - at baseline  General Comments: pt with decreased awareness of why she is at the hospital, impulsive with movement, and perseverative on wanting to leave     General Comments  VSS on RA    Exercises     Shoulder Instructions      Home Living  Family/patient expects to be discharged to:: Private residence Living Arrangements: Alone Available Help at Discharge: Other (Comment) (states she has family/friends but "would not call them") Type of Home: Other(Comment) (condo) Home Access: Stairs to enter [employer] of Steps: 2 flights Entrance Stairs-Rails: Right Home Layout: One level     Bathroom Shower/Tub: Chief Strategy Officer: Standard Bathroom Accessibility: Yes   Home Equipment: Cane - single point          Prior Functioning/Environment Prior Level of Function : Independent/Modified Independent;Driving             Mobility Comments: drives, ind without AD ADLs Comments: ind, per chart review is noncompliant with meds        OT Problem List: Decreased activity tolerance      OT Treatment/Interventions: Self-care/ADL training;Therapeutic exercise;Energy conservation;DME and/or AE instruction;Therapeutic activities;Patient/family education;Balance training    OT Goals(Current goals can be found in the care plan section) Acute Rehab OT Goals Patient Stated Goal: to go home OT Goal Formulation: With patient Time For Goal Achievement: 02/07/23 Potential to Achieve Goals: Good ADL Goals Pt Will Perform Tub/Shower Transfer: Tub transfer;Shower transfer;Independently;ambulating Additional ADL Goal #1: pt will verbalize 3 energy conservation/CHF mgmt strategies in prep for ADLs Additional ADL Goal #2: pt will perform pillbox assessment with passing score in prep for ADLs  OT Frequency: Min 1X/week    Co-evaluation              AM-PAC OT "6 Clicks" Daily Activity     Outcome Measure Help from another person eating meals?: None Help from another person taking care of personal grooming?: None Help from another person toileting, which includes using toliet, bedpan, or urinal?: None Help from another person bathing (including washing, rinsing, drying)?: A Little Help from another  person to put on and taking off regular upper body clothing?: None Help from another person to put on and taking off regular lower body clothing?: None 6 Click Score: 23   End of Session Equipment Utilized During Treatment: Gait belt Nurse Communication: Mobility status  Activity Tolerance: Patient tolerated treatment well Patient left: in bed;with call bell/phone within reach;with nursing/sitter in room  OT Visit Diagnosis: Muscle weakness (generalized) (M62.81)                Time: 1610-9604 OT Time Calculation (min): 12 min Charges:  OT General Charges $OT Visit: 1 Visit OT Evaluation $OT Eval Low Complexity: 1 Low  Keeshia Sanderlin K, OTD, OTR/L SecureChat Preferred Acute Rehab [phone]   Julias Mould K Koonce 01/24/2023, 1:35 PM

## 2023-01-24 NOTE — Care Management Obs Status (Signed)
MEDICARE OBSERVATION STATUS NOTIFICATION   Patient Details  Name: Teresa Poole MRN: [id number] Date of Birth: 13-Jul-1951   Medicare Observation Status Notification Given:  Yes    Tom-Johnson, Hershal Coria, RN 01/24/2023, 9:38 AM

## 2023-01-25 DIAGNOSIS — N179 Acute kidney failure, unspecified: Secondary | ICD-10-CM

## 2023-01-25 DIAGNOSIS — I5043 Acute on chronic combined systolic (congestive) and diastolic (congestive) heart failure: Secondary | ICD-10-CM | POA: Diagnosis not present

## 2023-01-25 DIAGNOSIS — Z7189 Other specified counseling: Secondary | ICD-10-CM | POA: Diagnosis not present

## 2023-01-25 DIAGNOSIS — F05 Delirium due to known physiological condition: Secondary | ICD-10-CM | POA: Diagnosis not present

## 2023-01-25 DIAGNOSIS — E1122 Type 2 diabetes mellitus with diabetic chronic kidney disease: Secondary | ICD-10-CM

## 2023-01-25 DIAGNOSIS — I509 Heart failure, unspecified: Secondary | ICD-10-CM

## 2023-01-25 DIAGNOSIS — N184 Chronic kidney disease, stage 4 (severe): Secondary | ICD-10-CM

## 2023-01-25 LAB — CBC
HCT: 23 % — ABNORMAL LOW (ref 36.0–46.0)
Hemoglobin: 7.3 g/dL — ABNORMAL LOW (ref 12.0–15.0)
MCH: 28.3 pg (ref 26.0–34.0)
MCHC: 31.7 g/dL (ref 30.0–36.0)
MCV: 89.1 fL (ref 80.0–100.0)
Platelets: 219 10*3/uL (ref 150–400)
RBC: 2.58 MIL/uL — ABNORMAL LOW (ref 3.87–5.11)
RDW: 13.9 % (ref 11.5–15.5)
WBC: 5.6 10*3/uL (ref 4.0–10.5)
nRBC: 0 % (ref 0.0–0.2)

## 2023-01-25 LAB — BASIC METABOLIC PANEL
Anion gap: 11 (ref 5–15)
BUN: 71 mg/dL — ABNORMAL HIGH (ref 8–23)
CO2: 25 mmol/L (ref 22–32)
Calcium: 7.8 mg/dL — ABNORMAL LOW (ref 8.9–10.3)
Chloride: 104 mmol/L (ref 98–111)
Creatinine, Ser: 6.39 mg/dL — ABNORMAL HIGH (ref 0.44–1.00)
GFR, Estimated: 7 mL/min — ABNORMAL LOW (ref >60)
Glucose, Bld: 128 mg/dL — ABNORMAL HIGH (ref 70–99)
Potassium: 5.2 mmol/L — ABNORMAL HIGH (ref 3.5–5.1)
Sodium: 140 mmol/L (ref 135–145)

## 2023-01-25 LAB — GLUCOSE, CAPILLARY
Glucose-Capillary: 88 mg/dL (ref 70–99)
Glucose-Capillary: 205 mg/dL — ABNORMAL HIGH (ref 70–99)
Glucose-Capillary: 162 mg/dL — ABNORMAL HIGH (ref 70–99)
Glucose-Capillary: 177 mg/dL — ABNORMAL HIGH (ref 70–99)

## 2023-01-25 LAB — TYPE AND SCREEN
ABO/RH(D): O POS
Antibody Screen: NEGATIVE

## 2023-01-25 LAB — BPAM RBC
Blood Product Expiration Date: 202406252359
Unit Type and Rh: 5100

## 2023-01-25 MED ORDER — HALOPERIDOL LACTATE 5 MG/ML IJ SOLN: 5.0000 mg | Freq: Once | INTRAMUSCULAR | Status: DC

## 2023-01-25 MED ORDER — ORAL CARE MOUTH RINSE: 15.0000 mL | OROMUCOSAL | Status: DC | PRN

## 2023-01-25 MED ORDER — SODIUM ZIRCONIUM CYCLOSILICATE 5 G PO PACK
5.0000 g | PACK | Freq: Once | ORAL | Status: AC
  Administered 2023-01-25: 5 g via ORAL
  Filled 2023-01-25 (×2): qty 1

## 2023-01-25 MED ORDER — CALCIUM CARBONATE ANTACID 500 MG PO CHEW
1.0000 | CHEWABLE_TABLET | Freq: Two times a day (BID) | ORAL | Status: DC
  Administered 2023-01-25 – 2023-01-28 (×6): 200 mg via ORAL
  Filled 2023-01-25 (×8): qty 1

## 2023-01-25 MED ORDER — ALUM & MAG HYDROXIDE-SIMETH 200-200-20 MG/5ML PO SUSP: 15.0000 mL | Freq: Four times a day (QID) | ORAL | Status: DC | PRN

## 2023-01-25 MED ORDER — ISOSORBIDE MONONITRATE ER 30 MG PO TB24
30.0000 mg | ORAL_TABLET | Freq: Every day | ORAL | Status: DC
  Administered 2023-01-25 – 2023-01-28 (×4): 30 mg via ORAL
  Filled 2023-01-25 (×5): qty 1

## 2023-01-25 MED ORDER — LORAZEPAM 2 MG/ML IJ SOLN
2.0000 mg | Freq: Once | INTRAMUSCULAR | Status: DC
  Filled 2023-01-25: qty 1

## 2023-01-25 MED ORDER — CARVEDILOL 12.5 MG PO TABS
12.5000 mg | ORAL_TABLET | Freq: Two times a day (BID) | ORAL | Status: DC
  Administered 2023-01-25 – 2023-01-28 (×7): 12.5 mg via ORAL
  Filled 2023-01-25 (×9): qty 1

## 2023-01-25 NOTE — Progress Notes (Signed)
PROGRESS NOTE  Teresa Poole  GEX:[id number] DOB: 1950/10/22 DOA: 01/21/2023 PCP: Malka So., MD   Brief Narrative: Teresa Poole is a 72 y.o. female with medical history significant of combined systolic/diastolic CHF, CKD stage IV, hypertension, diabetes type 2, coronary artery  disease, obesity, renal artery stenosis who presented to the emergency department as per suggestion of her PCP for the evaluation low hemoglobin. Patient also mentioned that she has been short of breath and tired for last 1 month.  Patient has been noticing lower extremity edema more than usual.  On presentation, she was found to be volume overloaded and worsened kidney function.  Nephrology consulted.  Started on IV Lasix.  Patient  has cognitive impairment and frequently wants to leave, does not understand her medical problems so psychiatry consulted for determination of capacity.  She was found not to have capacity.  We ar trying to reach the family/ TOC for safe discharge plan.  Currently IVCed.  Assessment & Plan:  Principal Problem:   Acute exacerbation of CHF (congestive heart failure) (HCC) Active Problems:   Acute renal failure superimposed on stage 4 chronic kidney disease (HCC)   Hypertension   Type 2 diabetes mellitus with chronic kidney disease, without long-term current use of insulin (HCC)   Anemia   Delirium due to another medical condition  Acute on chronic combined systolic/diastolic CHF: Appeared volume overloaded on presentation with bilateral lower extremity swelling.  Chest x-ray showed features of pulmonary edema, trace bilateral pleural effusion.  This is mostly secondary to cardiorenal syndrome due to her worsening kidney function.elevated BNP.  Patient does not take any diuretics at home.  She is noncompliant.Started on IV diuresis.  Continue to monitor daily input/output, weight Last echo has shown EF of 45 to 50% grade 2 diastolic dysfunction. Lasix changed to oral but now on  hold due to worsening kidney function.   AKI in CKD stage V/hyperkalemia: Baseline creatinine usually  in the range of 4.  Patient presented with creatinine in the range of 5.  She follows with nephrology, Dr. Marisue Humble.  Nephrology consulted . She  also has history of renal artery stenosis. As per nephrology,she is not a candidate for dialysis because of her cognitive issues/possible lack of capacity, noncompliance.  Nephrology signed off saying to continue Lasix 80 mg twice daily oral.  Kidney function worsened today, creatinine trended up to the range of 6, developed hyperkalemia.  Ordered Lokelma.  Requested nephrology follow-up   Normocytic anemia: Hemoglobin was found to be in the range of 7.  Baseline hemoglobin is from 8-9.  No evidence of acute blood loss.  No report of hematochezia or melena.  This is most likely secondary to her progressive CKD.  Patient was admitted for the same in April.  At that time GI was consulted and she underwent EGD with finding of Barrett's esophagus, no active bleeding, recommended to continue Protonix 20 mg twice daily at that time. Patient declines blood transfusion, hemoglobin in the range of 7 today.  Given IV iron.  Continue oral iron supplementation on discharge   History of coronary artery disease: Found to have nonobstructive coronary disease as per cardiac cath on 2015   Diabetes type 2: Takes Ozempic at home.  Continue sliding scale insulin here.  Monitor blood sugars   Hypertension: Presented with hypertension.  Takes Imdur, lisinopril, amlodipine, Coreg at home.  Will hold lisinopril due to AKI.   History of memory impairment/adjustment disorder with anxiety: On Seroquel as per the  records.  As per sister, she is noncompliant and she does not take any medications at home most of the time.  Patient does not have insight of her medical problems.  We requested psychiatry assistance.  Psych currently determined that she does not have capacity.  Currently  with a sitter.  She is currently IVCed because she is trying to leave.  We need to figure out safe discharge plan before she lis discharged Patient is still driving her car  History of OSA: Not on CPAP   Hyperlipidemia: On statin  Disposition: Long discussion held with sister on phone on 6/1.  She is currently at [place].  She states she will come to the hospital on Tuesday.  She had tried to take legal guardianship in the past but the patient did not allow.  The sister also tried to keep Ms. Dois Davenport at her home but she left and becomes violent.  Sister also tried to keep her car keys because her PCP did not recommend her to drive.  But Patient wanted her car keys back and it was given back to her. Will wait until Tuesday until the sister arrives and we can have a conversation about legal guardianship or SNF option or simply go back  sisters house.  At the meantime we I put orders for PT/OT.  Currently on IVC             DVT prophylaxis:heparin injection 5,000 Units Start: 01/22/23 0600     Code Status: Full Code  Family Communication: Called and discussed with sister on phone on 5/30  Patient status: Inpatient  Patient is from : Home  Anticipated discharge to: We need to figure out safe dc plan. Patient is cognitively impaired, does not have insight of medical problems. Does not have capacity. She lives alone. She has a sister who helps her intermittently.  Currently there is no need to make her stay at hospital. Will discharge her after we get a safe discharge plan. SNF an option?   Estimated DC date: 1-2 days   Consultants: Nephrology, psychiatry  Procedures: None yet  Antimicrobials:  Anti-infectives (From admission, onward)    None       Subjective: Patient seen and examined the bedside today.  Appeared comfortable.  Hemodynamically stable.  On room air.  Denies any shortness of breath or cough.  Significant improvement in the lower extremity edema.  Denies any  complaints today.  Objective: Vitals:   01/25/23 0000 01/25/23 0403 01/25/23 0749 01/25/23 0804  BP:  (!) 129/59 128/67   Pulse: 66 68 71   Resp:    20  Temp:  97.7 F (36.5 C) 97.9 F (36.6 C)   TempSrc:  Oral Oral   SpO2: 97% 95% 100%   Weight:      Height:        Intake/Output Summary (Last 24 hours) at 01/25/2023 1052 Last data filed at 01/25/2023 0900 Gross per 24 hour  Intake 1320 ml  Output 500 ml  Net 820 ml   Filed Weights   01/21/23 1225 01/22/23 0428  Weight: 72 kg 77.6 kg    Examination: General exam: Overall comfortable, not in distress HEENT: PERRL Respiratory system:  no wheezes or crackles  Cardiovascular system: S1 & S2 heard, RRR.  Gastrointestinal system: Abdomen is nondistended, soft and nontender. Central nervous system: Alert and awake, oriented to place only Extremities: No edema, no clubbing ,no cyanosis Skin: No rashes, no ulcers,no icterus     Data Reviewed:  I have personally reviewed following labs and imaging studies  CBC: Recent Labs  Lab 01/21/23 1244 01/22/23 0037 01/23/23 0035 01/25/23 0119  WBC 5.3 5.7 5.6 5.6  HGB 7.1* 7.0* 7.3* 7.3*  HCT 23.0* 21.8* 22.8* 23.0*  MCV 90.9 89.0 88.7 89.1  PLT 212 196 209 219   Basic Metabolic Panel: Recent Labs  Lab 01/21/23 1244 01/21/23 1543 01/22/23 0037 01/23/23 0035 01/25/23 0119  NA 142 141 141 142 140  K 5.3* 5.2* 4.3 4.6 5.2*  CL 112* 110 111 109 104  CO2 21* 19* 21* 24 25  GLUCOSE 164* 137* 162* 193* 128*  BUN 54* 55* 52* 57* 71*  CREATININE 5.41* 5.25* 5.36* 5.61* 6.39*  CALCIUM 8.5* 8.7* 8.4* 8.2* 7.8*  MG  --  1.8  --   --   --      No results found for this or any previous visit (from the past 240 hour(s)).   Radiology Studies: No results found.  Scheduled Meds:  sodium chloride   Intravenous Once   amLODipine  10 mg Oral Daily   carvedilol  12.5 mg Oral BID WC   docusate sodium  100 mg Oral BID   heparin  5,000 Units Subcutaneous Q8H   insulin aspart  0-9  Units Subcutaneous TID WC   isosorbide mononitrate  30 mg Oral Daily   LORazepam  2 mg Intramuscular Once   pantoprazole  20 mg Oral BID   QUEtiapine  50 mg Oral QHS   rosuvastatin  40 mg Oral Daily   thiamine  100 mg Oral Daily   Continuous Infusions:     LOS: 1 day   Burnadette Pop, MD [facility] P6/10/2022, 10:52 AM

## 2023-01-25 NOTE — Progress Notes (Addendum)
[facility] NEPHROLOGY PROGRESS NOTE  Assessment/ Plan: Pt is a 72 y.o. yo female with past medical history significant for HTN, DM, CAD, CHF, CKD stage V who was admitted on 01/21/2023 with anemia in the setting of stage 5 CKD.  # CKD stage V: Noted the patient has CKD  stage V at baseline and follows with Dr. Darylene Price at [facility].  The patient was seen by Dr. Kathrene Bongo during this hospitalization who thought that she is not a candidate for dialysis.  Apparently, 3 other physician from [facility] felt that the patient is not a candidate for HD because of nonadherence, poor insight accompanied by angry and agitated behavior.  Patient had also expressed to Dr. Marisue Humble previously that she did not desire dialysis. I agree she won't be able to do dialysis unless her behavior, agitation etc improves significantly.   The behavioral issue appears to be on display while in the hospital therefore she is currently on IVC. We are reconsulted today because of elevated creatinine level. I agree with holding diuretics further.  I do not see any overt signs or symptoms of uremia and volume status looks acceptable. I recommend palliative care consult and her care should really focus surrounding symptom management etc.  This is a very challenging and tough situation.   Noted her sister is coming tomorrow to the hospital..  # Hypertension/volume: Blood pressure and volume status looks acceptable.  Agree with holding diuretics now she will need some oral diuretics on discharge.  Monitor BP.  # Anemia due to CKD: Received iron and ESA in the hospital.  Not sure if she will comply with outpatient ESA management.  # Mild hyperkalemia: got a dose Lokelma.  Discussed with the nursing staff and primary team.  Subjective: Seen and examined at bedside.  Patient was talking tangentially, about driving and also asking when she can go home.  Discussed with the nursing staff as well.  Urine output is recorded 1.5  L. Objective Vital signs in last 24 hours: Vitals:   01/25/23 0403 01/25/23 0749 01/25/23 0804 01/25/23 1100  BP: (!) 129/59 128/67  (!) 129/51  Pulse: 68 71  72  Resp:   20 18  Temp: 97.7 F (36.5 C) 97.9 F (36.6 C)  98.2 F (36.8 C)  TempSrc: Oral Oral  Oral  SpO2: 95% 100%  100%  Weight:      Height:       Weight change:   Intake/Output Summary (Last 24 hours) at 01/25/2023 1321 Last data filed at 01/25/2023 0900 Gross per 24 hour  Intake 840 ml  Output --  Net 840 ml       Labs: RENAL PANEL Recent Labs  Lab 01/21/23 1244 01/21/23 1543 01/22/23 0037 01/23/23 0035 01/25/23 0119  NA 142 141 141 142 140  K 5.3* 5.2* 4.3 4.6 5.2*  CL 112* 110 111 109 104  CO2 21* 19* 21* 24 25  GLUCOSE 164* 137* 162* 193* 128*  BUN 54* 55* 52* 57* 71*  CREATININE 5.41* 5.25* 5.36* 5.61* 6.39*  CALCIUM 8.5* 8.7* 8.4* 8.2* 7.8*  MG  --  1.8  --   --   --   ALBUMIN 2.8*  --   --   --   --     Liver Function Tests: Recent Labs  Lab 01/21/23 1244  AST 16  ALT 13  ALKPHOS 52  BILITOT 0.5  PROT 5.7*  ALBUMIN 2.8*   No results for input(s): "LIPASE", "AMYLASE" in the  last 168 hours. No results for input(s): "AMMONIA" in the last 168 hours. CBC: Recent Labs    04/18/22 0154 04/23/22 0023 11/06/22 1929 11/07/22 0446 11/08/22 0410 12/22/22 0000 12/22/22 0715 01/03/23 1151 01/21/23 1244 01/21/23 1543 01/21/23 1808 01/22/23 0037 01/23/23 0035 01/25/23 0119  HGB 8.1*   < >  --  9.8*   < >  --    < > 8.7* 7.1*  --   --  7.0* 7.3* 7.3*  MCV 91.3   < >  --  89.0   < >  --    < > 91.3 90.9  --   --  89.0 88.7 89.1  VITAMINB12 242  --   --  231  --  1,019*  --   --   --  471  --   --   --   --   FOLATE 17.0  --   --  13.0  --  16.9  --   --   --  18.0  --   --   --   --   FERRITIN 24  --  21  --   --  14  --   --   --  16  --   --   --   --   TIBC 295  --  300  --   --  322  --   --   --  361  --   --   --   --   IRON 47  --  33  --   --  57  --   --   --  42  --   --    --   --   RETICCTPCT  --   --   --  1.0  --  1.5  --   --   --   --  1.5  --   --   --    < > = values in this interval not displayed.    Cardiac Enzymes: No results for input(s): "CKTOTAL", "CKMB", "CKMBINDEX", "TROPONINI" in the last 168 hours. CBG: Recent Labs  Lab 01/24/23 0805 01/24/23 1122 01/24/23 1528 01/25/23 0623 01/25/23 1114  GLUCAP 94 144* 140* 88 205*    Iron Studies: No results for input(s): "IRON", "TIBC", "TRANSFERRIN", "FERRITIN" in the last 72 hours. Studies/Results: No results found.  Medications: Infusions:   Scheduled Medications:  sodium chloride   Intravenous Once   amLODipine  10 mg Oral Daily   carvedilol  12.5 mg Oral BID WC   docusate sodium  100 mg Oral BID   heparin  5,000 Units Subcutaneous Q8H   insulin aspart  0-9 Units Subcutaneous TID WC   isosorbide mononitrate  30 mg Oral Daily   LORazepam  2 mg Intramuscular Once   pantoprazole  20 mg Oral BID   QUEtiapine  50 mg Oral QHS   rosuvastatin  40 mg Oral Daily   thiamine  100 mg Oral Daily    have reviewed scheduled and prn medications.  Physical Exam: General:NAD, comfortable Heart:RRR, s1s2 nl Lungs:clear b/l, no crackle Abdomen:soft, Non-tender, non-distended Extremities: Trace lower extremities edema Neurology: Alert awake and tangential thought  Kemani Heidel [name] 01/25/2023,1:21 PM  LOS: 1 day

## 2023-01-25 NOTE — Significant Event (Signed)
Pt is upset and wants to go home. Very short-term memory. Gave PRN

## 2023-01-25 NOTE — Progress Notes (Signed)
Patient belongings taken to security in the ED. Patient's sneakers and cardigan returned to the unit. Documentation of personal belongings has been placed on the chart.

## 2023-01-25 NOTE — Plan of Care (Addendum)
Patient remains in HF PCU. Patient is high risk for violence and high fall risk. Patient has active IVC order.   Problem: Education: Goal: Ability to describe self-care measures that may prevent or decrease complications (Diabetes Survival Skills Education) will improve Outcome: Not Progressing Goal: Individualized Educational Video(s) Outcome: Not Progressing   Problem: Coping: Goal: Ability to adjust to condition or change in health will improve Outcome: Not Progressing   Problem: Fluid Volume: Goal: Ability to maintain a balanced intake and output will improve Outcome: Not Progressing   Problem: Health Behavior/Discharge Planning: Goal: Ability to identify and utilize available resources and services will improve Outcome: Not Progressing Goal: Ability to manage health-related needs will improve Outcome: Not Progressing   Problem: Metabolic: Goal: Ability to maintain appropriate glucose levels will improve Outcome: Not Progressing   Problem: Nutritional: Goal: Maintenance of adequate nutrition will improve Outcome: Not Progressing Goal: Progress toward achieving an optimal weight will improve Outcome: Not Progressing   Problem: Skin Integrity: Goal: Risk for impaired skin integrity will decrease Outcome: Not Progressing   Problem: Tissue Perfusion: Goal: Adequacy of tissue perfusion will improve Outcome: Not Progressing   Problem: Education: Goal: Knowledge of General Education information will improve Description: Including pain rating scale, medication(s)/side effects and non-pharmacologic comfort measures Outcome: Not Progressing   Problem: Health Behavior/Discharge Planning: Goal: Ability to manage health-related needs will improve Outcome: Not Progressing   Problem: Clinical Measurements: Goal: Ability to maintain clinical measurements within normal limits will improve Outcome: Not Progressing Goal: Will remain free from infection Outcome: Not  Progressing Goal: Diagnostic test results will improve Outcome: Not Progressing Goal: Respiratory complications will improve Outcome: Not Progressing Goal: Cardiovascular complication will be avoided Outcome: Not Progressing   Problem: Activity: Goal: Risk for activity intolerance will decrease Outcome: Not Progressing   Problem: Nutrition: Goal: Adequate nutrition will be maintained Outcome: Not Progressing   Problem: Coping: Goal: Level of anxiety will decrease Outcome: Not Progressing   Problem: Elimination: Goal: Will not experience complications related to bowel motility Outcome: Not Progressing Goal: Will not experience complications related to urinary retention Outcome: Not Progressing   Problem: Pain Managment: Goal: General experience of comfort will improve Outcome: Not Progressing   Problem: Safety: Goal: Ability to remain free from injury will improve Outcome: Not Progressing   Problem: Skin Integrity: Goal: Risk for impaired skin integrity will decrease Outcome: Not Progressing

## 2023-01-25 NOTE — Plan of Care (Signed)
  Problem: Clinical Measurements: Goal: Respiratory complications will improve Outcome: Progressing   Problem: Activity: Goal: Risk for activity intolerance will decrease Outcome: Progressing   

## 2023-01-25 NOTE — Evaluation (Signed)
Physical Therapy Evaluation Patient Details Name: Teresa Poole MRN: [id number] DOB: 01-30-1951 Today's Date: 01/25/2023  History of Present Illness  Pt is a 72 y/o F presenting to ED on 5/30 from PCP with low hgb and SOB x1 month, found to be voume overloaded with worsening kidney function, admitted for acute CHF exacerbation. PMH includes CHF, CKD IV, HTN, DM2   Clinical Impression  Pt admitted with above. Pt with impaired cognition greatly affecting safety and caring for her self. Pt with decreased insight to deficits, not oriented to situation or date, impulsive, decreased insight to safety, with noted SOB with activity that poses pt at increased falls risk as pt doesn't recognize she needs to rest.  Pt unsafe to return home alone at this time and would require 24/7 assist to return home safely due to cognitive impairments and increased falls risk. Acute PT to cont to follow.     Recommendations for follow up therapy are one component of a multi-disciplinary discharge planning process, led by the attending physician.  Recommendations may be updated based on patient status, additional functional criteria and insurance authorization.  Follow Up Recommendations       Assistance Recommended at Discharge Frequent or constant Supervision/Assistance (due to impaired cognition)  Patient can return home with the following  Direct supervision/assist for medications management;Direct supervision/assist for financial management;Assist for transportation;Help with stairs or ramp for entrance;Assistance with cooking/housework    Equipment Recommendations None recommended by PT  Recommendations for Other Services       Functional Status Assessment Patient has had a recent decline in their functional status and demonstrates the ability to make significant improvements in function in a reasonable and predictable amount of time.     Precautions / Restrictions Precautions Precaution Comments:  safety sitter, impulsivity Restrictions Weight Bearing Restrictions: No      Mobility  Bed Mobility               General bed mobility comments: seated EOB upon arrival and departure    Transfers Overall transfer level: Needs assistance Equipment used: None Transfers: Sit to/from Stand Sit to Stand: Min guard           General transfer comment: min guard due to safety as pt very impulsive, quick to move, mildly unsteady    Ambulation/Gait Ambulation/Gait assistance: Min guard Gait Distance (Feet): 200 Feet Assistive device: None Gait Pattern/deviations: Step-through pattern, Decreased stride length, Staggering left, Staggering right Gait velocity: wfl     General Gait Details: pt vearing L/R requiring min guard to stay in middle of hallway, pt easily distracted, noted SOB however pt reports 0/4 DOE because "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 No overt LOB however pt with significant lateral sway  Stairs Stairs: Yes Stairs assistance: Min assist Stair Management: One rail Right, Alternating pattern, Forwards Number of Stairs: 15 General stair comments: pt with poor eccentric control descending stairs requiring minA to prevent increased forward weight shift, pt heavy footed  Wheelchair Mobility    Modified Rankin (Stroke Patients Only)       Balance                                 Standardized Balance Assessment Standardized Balance Assessment : Dynamic Gait Index   Dynamic Gait Index Level Surface: Mild Impairment Change in Gait Speed:  Moderate Impairment Gait with Horizontal Head Turns: Mild Impairment Gait with Vertical Head Turns: Mild Impairment Gait and Pivot Turn: Mild Impairment Step Over Obstacle: Mild Impairment Step Around Obstacles: Mild Impairment Steps: Mild Impairment Total Score: 15       Pertinent Vitals/Pain Pain Assessment Pain  Assessment: No/denies pain    Home Living Family/patient expects to be discharged to:: Private residence Living Arrangements: Alone Available Help at Discharge: Other (Comment) (per chart family is out of town, sister in [place], pt reports son in [place]) Type of Home: Other(Comment) (condo) Home Access: Stairs to enter Entrance Stairs-Rails: Right Entrance Stairs-Number of Steps: 2 flights   Home Layout: One level Home Equipment: Cane - single point      Prior Function Prior Level of Function : Independent/Modified Independent;Driving             Mobility Comments: drives, ind without AD, 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 ADLs Comments: ind, per chart review is noncompliant with meds     Hand Dominance   Dominant Hand: Right    Extremity/Trunk Assessment   Upper Extremity Assessment Upper Extremity Assessment: Overall WFL for tasks assessed    Lower Extremity Assessment Lower Extremity Assessment: Overall WFL for tasks assessed    Cervical / Trunk Assessment Cervical / Trunk Assessment: Normal  Communication   Communication: No difficulties  Cognition Arousal/Alertness: Awake/alert Behavior During Therapy: Impulsive, Restless Overall Cognitive Status: History of cognitive impairments - at baseline                                 General Comments: per chart pt's sister reports pt non-compliant and unaware of medical conditions. Pt with decreased safety awareness, impulsivity, decreased insight to deficits, poor memory, not oriented to date, states "I just look at my cell phone" however once pt told the date she was unable to re-state the date        General Comments General comments (skin integrity, edema, etc.): SpO2 91% on RA    Exercises     Assessment/Plan    PT Assessment Patient needs continued PT services  PT Problem List Decreased activity tolerance;Decreased balance;Decreased mobility;Decreased knowledge of use of  DME;Decreased cognition;Decreased safety awareness       PT Treatment Interventions DME instruction;Gait training;Stair training;Functional mobility training;Therapeutic activities;Therapeutic exercise;Balance training;Neuromuscular re-education;Cognitive remediation    PT Goals (Current goals can be found in the Care Plan section)  Acute Rehab PT Goals Patient Stated Goal: home today PT Goal Formulation: With patient Time For Goal Achievement: 02/08/23 Potential to Achieve Goals: Good Additional Goals Additional Goal #1: Pt to score >19 on DGI to indicate minimal falls risk.    Frequency Min 2X/week     Co-evaluation               AM-PAC PT "6 Clicks" Mobility  Outcome Measure Help needed turning from your back to your side while in a flat bed without using bedrails?: None Help needed moving from lying on your back to sitting on the side of a flat bed without using bedrails?: None Help needed moving to and from a bed to a chair (including a wheelchair)?: A Little Help needed standing up from a chair using your arms (e.g., wheelchair or bedside chair)?: A Little Help needed to walk in hospital room?: A Little Help needed climbing 3-5 steps with a railing? : A Little 6 Click Score: 20  End of Session Equipment Utilized During Treatment: Gait belt Activity Tolerance: Patient tolerated treatment well Patient left: in bed;with call bell/phone within reach;with nursing/sitter in room (sitting EOB with sitter) Nurse Communication: Mobility status PT Visit Diagnosis: Unsteadiness on feet (R26.81)    Time: 1610-9604 PT Time Calculation (min) (ACUTE ONLY): 18 min   Charges:   PT Evaluation $PT Eval Moderate Complexity: 1 Mod          Lewis Shock, PT, DPT Acute Rehabilitation Services Secure chat preferred Office #: [phone]   Iona Hansen 01/25/2023, 10:49 AM
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
## 2023-01-25 NOTE — Plan of Care (Signed)
  Problem: Activity: Goal: Risk for activity intolerance will decrease Outcome: Progressing   Problem: Nutrition: Goal: Adequate nutrition will be maintained Outcome: Progressing   

## 2023-01-25 NOTE — Progress Notes (Addendum)
Nursing shift summary:  01/25/23  1930 hrs: Patient is agitated at this time; cursing and stating she "wants to leave". Patient is non-redirectable. Patient refuses VS check and assessment at this time. Sitter is present at bedside. This RN also confirms that the patient's IVC paperwork is present on the patient's paper chart at this time. Patient is provided with snacks and water at this time also which the patient declined.  1950 hrs: Patient is sitting at bedside consuming a snack and agrees to limited physical assessment at this time.   2050 hrs: Patient remains agitated, cursing at staff. Patient did agree to take her PO meds, including PRN Seroquel. Patient declined SQ heparin and Dr. Joneen Roach was notified. Orders for SCDs were received which the patient is also refusing.   2156 hrs: Patient attempting to leave unit and redirection attempts failed. Patient wandered (with sitter and RN) as far as Sales promotion account executive. Security and [employer] called to unit. Consulting civil engineer notified. Patient is escorted back to her room by two security guards. Patient's belongings were (again) secured by staff (per [facility] instruction) - minus the patient's present clothing which the patient is wearing. Patient refused to change into hospital gown. Dr. Joneen Roach notified by charge RN of the situation and orders were received for IM Lorazepam. 2 mg IM Lorazepam administered by this RN with two security guards present. At time of writing, patient remains agitated, verbally aggressive, and non-redirectable.   0230 hrs: Patient resting in bed with eyes closed at this time.

## 2023-01-25 NOTE — Progress Notes (Signed)
Occupational Therapy Treatment Patient Details Name: Teresa Poole MRN: [id number] DOB: 1950-11-24 Today's Date: 01/25/2023   History of present illness Pt is a 72 y/o F presenting to ED on 5/30 from PCP with low hgb and SOB x1 month, found to be voume overloaded with worsening kidney function, admitted for acute CHF exacerbation. PMH includes CHF, CKD IV, HTN, DM2   OT comments  Pt able to complete ADL tasks and mobility @ modified independent level. Assessed with the Short Blessed test of Memory and Concentration. Pt scores a 15/28, which places her in the significantly impaired category as noted below. Recommend DC home with 24/7 and direct assistance with all IADL tasks; refrain from driving due to impaired cognition. Recommend pt ambulate frequently with nsg staff. Acute OT signing off.    Recommendations for follow up therapy are one component of a multi-disciplinary discharge planning process, led by the attending physician.  Recommendations may be updated based on patient status, additional functional criteria and insurance authorization.    Assistance Recommended at Discharge Set up Supervision/Assistance  Patient can return home with the following  Assistance with cooking/housework;Direct supervision/assist for medications management   Equipment Recommendations  None recommended by OT    Recommendations for Other Services      Precautions / Restrictions Precautions Precautions: Other (comment) Precaution Comments: Recruitment consultant, impulsivity Restrictions Weight Bearing Restrictions: No       Mobility Bed Mobility               General bed mobility comments: seated EOB upon arrival and departure    Transfers Overall transfer level: Modified independent                       Balance Overall balance assessment: No apparent balance deficits (not formally assessed)                                         ADL either performed or  assessed with clinical judgement   ADL                                       Functional mobility during ADLs: Modified independent General ADL Comments: Able to copmlete bathing/dressing independently    Extremity/Trunk Assessment Upper Extremity Assessment Upper Extremity Assessment: Overall WFL for tasks assessed   Lower Extremity Assessment Lower Extremity Assessment: Overall WFL for tasks assessed   Cervical / Trunk Assessment Cervical / Trunk Assessment: Normal    Vision   Vision Assessment?: No apparent visual deficits   Perception     Praxis      Cognition Arousal/Alertness: Awake/alert Behavior During Therapy: Impulsive, Restless Overall Cognitive Status: No family/caregiver present to determine baseline cognitive functioning                                 General Comments: scored a 15 on the Short Blessed Test, placing her in the impaired category; unable to recal what she had for lunch(finished lunch just prioro to session); impaired interllectual awareness, impacting safety/judgemetn and insight into managingIADL tasks        Exercises      Shoulder Instructions       General Comments VSS on RA    Pertinent  Vitals/ Pain       Pain Assessment Pain Assessment: No/denies pain  Home Living Family/patient expects to be discharged to:: Private residence Living Arrangements: Alone Available Help at Discharge: Other (Comment) (per chart family is out of town, sister in [place], pt reports son in [place]) Type of Home: Other(Comment) (condo) Home Access: Stairs to enter [employer] of Steps: 2 flights Entrance Stairs-Rails: Right Home Layout: One level     Bathroom Shower/Tub: Chief Strategy Officer: Standard Bathroom Accessibility: Yes   Home Equipment: Cane - single point          Prior Functioning/Environment              Frequency  Min 1X/week        Progress Toward Goals  OT  Goals(current goals can now be found in the care plan section)  Progress towards OT goals: Goals met/education completed, patient discharged from OT  Acute Rehab OT Goals OT Goal Formulation: With patient Time For Goal Achievement: 02/07/23 Potential to Achieve Goals: Good ADL Goals Pt Will Perform Tub/Shower Transfer: Tub transfer;Shower transfer;Independently;ambulating Additional ADL Goal #1: pt will verbalize 3 energy conservation/CHF mgmt strategies in prep for ADLs Additional ADL Goal #2: pt will perform pillbox assessment with passing score in prep for ADLs  Plan Discharge plan remains appropriate;All goals met and education completed, patient discharged from OT services    Co-evaluation                 AM-PAC OT "6 Clicks" Daily Activity     Outcome Measure   Help from another person eating meals?: None Help from another person taking care of personal grooming?: None Help from another person toileting, which includes using toliet, bedpan, or urinal?: None Help from another person bathing (including washing, rinsing, drying)?: None Help from another person to put on and taking off regular upper body clothing?: None Help from another person to put on and taking off regular lower body clothing?: None 6 Click Score: 24    End of Session    OT Visit Diagnosis: Muscle weakness (generalized) (M62.81)   Activity Tolerance Patient tolerated treatment well   Patient Left in bed;with call bell/phone within reach;with nursing/sitter in room (seated EOB)   Nurse Communication Mobility status        Time: 1610-9604 OT Time Calculation (min): 14 min  Charges: OT General Charges $OT Visit: 1 Visit OT Treatments $Self Care/Home Management : 8-22 mins  Luisa Dago, OT/L   Acute OT Clinical Specialist Acute Rehabilitation Services Pager [phone] Office [phone]   [facility] 01/25/2023, 1:41 PM

## 2023-01-25 NOTE — Progress Notes (Signed)
Pt. Attempting to leave. Security at pts. Bedside. On call from [facility] paged to make aware.

## 2023-01-26 DIAGNOSIS — Z7189 Other specified counseling: Secondary | ICD-10-CM | POA: Diagnosis not present

## 2023-01-26 DIAGNOSIS — I509 Heart failure, unspecified: Secondary | ICD-10-CM | POA: Diagnosis not present

## 2023-01-26 DIAGNOSIS — I5043 Acute on chronic combined systolic (congestive) and diastolic (congestive) heart failure: Secondary | ICD-10-CM | POA: Diagnosis not present

## 2023-01-26 DIAGNOSIS — N179 Acute kidney failure, unspecified: Secondary | ICD-10-CM | POA: Diagnosis not present

## 2023-01-26 DIAGNOSIS — F05 Delirium due to known physiological condition: Secondary | ICD-10-CM | POA: Diagnosis not present

## 2023-01-26 LAB — BASIC METABOLIC PANEL
Anion gap: 13 (ref 5–15)
BUN: 85 mg/dL — ABNORMAL HIGH (ref 8–23)
CO2: 24 mmol/L (ref 22–32)
Calcium: 8.2 mg/dL — ABNORMAL LOW (ref 8.9–10.3)
Chloride: 99 mmol/L (ref 98–111)
Creatinine, Ser: 7.14 mg/dL — ABNORMAL HIGH (ref 0.44–1.00)
GFR, Estimated: 6 mL/min — ABNORMAL LOW (ref >60)
Glucose, Bld: 144 mg/dL — ABNORMAL HIGH (ref 70–99)
Potassium: 5.2 mmol/L — ABNORMAL HIGH (ref 3.5–5.1)
Sodium: 136 mmol/L (ref 135–145)

## 2023-01-26 LAB — GLUCOSE, CAPILLARY
Glucose-Capillary: 105 mg/dL — ABNORMAL HIGH (ref 70–99)
Glucose-Capillary: 92 mg/dL (ref 70–99)

## 2023-01-26 MED ORDER — SODIUM ZIRCONIUM CYCLOSILICATE 10 G PO PACK
10.0000 g | PACK | Freq: Every day | ORAL | Status: DC
  Administered 2023-01-26 – 2023-01-28 (×3): 10 g via ORAL
  Filled 2023-01-26 (×4): qty 1

## 2023-01-26 NOTE — Progress Notes (Signed)
Pt sister [name] returned CSW call. Sister refused to meet with pt present. Pt sister stated she is still open to meeting with palliative and will be able to meet palliative tomorrow at 11am.

## 2023-01-26 NOTE — Progress Notes (Signed)
PROGRESS NOTE  Teresa Poole  ZOX:[id number] DOB: 02/11/1951 DOA: 01/21/2023 PCP: Malka So., MD   Brief Narrative: Teresa Poole is a 72 y.o. female with medical history significant of combined systolic/diastolic CHF, CKD stage IV, hypertension, diabetes type 2, coronary artery  disease, obesity, renal artery stenosis who presented to the emergency department as per suggestion of her PCP for the evaluation low hemoglobin. Patient also mentioned that she has been short of breath and tired for last 1 month.  Patient has been noticing lower extremity edema more than usual.  On presentation, she was found to be volume overloaded and worsened kidney function.  Nephrology consulted.  Started on IV Lasix.  Patient  has cognitive impairment and frequently wants to leave, does not understand her medical problems so psychiatry consulted for determination of capacity.  She was found not to have capacity.  We ar trying to reach the family/ TOC for safe discharge plan.  Currently IVCed.  Assessment & Plan:  Principal Problem:   Acute exacerbation of CHF (congestive heart failure) (HCC) Active Problems:   Acute renal failure superimposed on stage 4 chronic kidney disease (HCC)   Hypertension   Type 2 diabetes mellitus with chronic kidney disease, without long-term current use of insulin (HCC)   Anemia   Delirium due to another medical condition  AKI in CKD stage V/hyperkalemia: Baseline creatinine usually  in the range of 4.  Patient presented with creatinine in the range of 5.  She follows with nephrology, Dr. Marisue Humble.  Nephrology consulted . She  also has history of renal artery stenosis. As per nephrology,she is not a candidate for dialysis because of her cognitive issues/possible lack of capacity, noncompliance.  Nephrology signed off saying to continue Lasix 80 mg twice daily oral.  Kidney function worsened today, creatinine trended up to the range of 6, developed hyperkalemia.  On Lokelma.   Nephrology following.  Palliative care following   Acute on chronic combined systolic/diastolic CHF: Appeared volume overloaded on presentation with bilateral lower extremity swelling.  Chest x-ray showed features of pulmonary edema, trace bilateral pleural effusion.  This is mostly secondary to cardiorenal syndrome due to her worsening kidney function.elevated BNP.  Patient does not take any diuretics at home.  She is noncompliant.Started on IV diuresis.  Continue to monitor daily input/output, weight Last echo has shown EF of 45 to 50% grade 2 diastolic dysfunction. Lasix changed to oral but now on hold due to worsening kidney function.    Normocytic anemia: Hemoglobin was found to be in the range of 7.  Baseline hemoglobin is from 8-9.  No evidence of acute blood loss.  No report of hematochezia or melena.  This is most likely secondary to her progressive CKD.  Patient was admitted for the same in April.  At that time GI was consulted and she underwent EGD with finding of Barrett's esophagus, no active bleeding, recommended to continue Protonix 20 mg twice daily at that time. Patient declines blood transfusion, hemoglobin in the range of 7 today.  Given IV iron.  Continue oral iron supplementation on discharge   History of coronary artery disease: Found to have nonobstructive coronary disease as per cardiac cath on 2015   Diabetes type 2: Takes Ozempic at home.  Continue sliding scale insulin here.  Monitor blood sugars   Hypertension: Presented with hypertension.  Takes Imdur, lisinopril, amlodipine, Coreg at home.  Will hold lisinopril due to AKI.   History of memory impairment/adjustment disorder with anxiety: On  Seroquel as per the records.  As per sister, she is noncompliant and she does not take any medications at home most of the time.  Patient does not have insight of her medical problems.  We requested psychiatry assistance.  Psych currently determined that she does not have capacity.   Currently with a sitter.  She is currently IVCed because she is trying to leave.  We need to figure out safe discharge plan before she lis discharged Patient is still driving her car  History of OSA: Not on CPAP   Hyperlipidemia: On statin  Disposition: Long discussion held with sister on phone on 6/1.  She is currently at [place].  She states she will come to the hospital on Tuesday.  She had tried to take legal guardianship in the past but the patient did not allow.  The sister also tried to keep Ms. Dois Davenport at her home but she left and becomes violent.  Sister also tried to keep her car keys because her PCP did not recommend her to drive.  But Patient wanted her car keys back and it was given back to her.  Sister coming tomorrow.  Palliative care waiting to talk to her. Will wait until the sister arrives and we can have a conversation about legal guardianship or SNF option or simply go back  sisters house.Currently on IVC.  PT/OT said no follow-up required             DVT prophylaxis:Place and maintain sequential compression device Start: 01/25/23 2127 heparin injection 5,000 Units Start: 01/22/23 0600     Code Status: Full Code  Family Communication: Called and discussed with sister on phone on 5/30  Patient status: Inpatient  Patient is from : Home  Anticipated discharge to: We need to figure out safe dc plan. Patient is cognitively impaired, does not have insight of medical problems. Does not have capacity. She lives alone. She has a sister who helps her intermittently.  Currently there is no need to make her stay at hospital. Will discharge her after we get a safe discharge plan. SNF an option?   Estimated DC date: 1-2 days   Consultants: Nephrology, psychiatry  Procedures: None yet  Antimicrobials:  Anti-infectives (From admission, onward)    None       Subjective: Patient seen and examined at bedside today.  Sitter at the bedside.  She was sleeping when I  arrived but woke up on calling her name.  She was sitting at the edge of the bed.  She did not look in any kind of  distress.  She is on room air.  She does not have significant lower extremity edema.  Denies any shortness of breath or cough.  Obviously she is not happy because she is in the hospital  Objective: Vitals:   01/25/23 2002 01/26/23 0659 01/26/23 0700 01/26/23 1100  BP: 99/73 (!) 148/59  (!) 125/56  Pulse: 63 (!) 41 83   Resp: 18 16  16   Temp: (!) 97.5 F (36.4 C) (!) 97.5 F (36.4 C)    TempSrc: Oral Axillary    SpO2: 100% 100% 96%   Weight:      Height:        Intake/Output Summary (Last 24 hours) at 01/26/2023 1113 Last data filed at 01/26/2023 0740 Gross per 24 hour  Intake 960 ml  Output 550 ml  Net 410 ml   Filed Weights   01/21/23 1225 01/22/23 0428  Weight: 72 kg 77.6 kg  Examination: General exam: Overall comfortable, not in distress HEENT: PERRL Respiratory system:  no wheezes or crackles  Cardiovascular system: S1 & S2 heard, RRR.  Gastrointestinal system: Abdomen is nondistended, soft and nontender. Central nervous system: Alert and awake, oriented to place only. Extremities: No edema, no clubbing ,no cyanosis Skin: No rashes, no ulcers,no icterus     Data Reviewed: I have personally reviewed following labs and imaging studies  CBC: Recent Labs  Lab 01/21/23 1244 01/22/23 0037 01/23/23 0035 01/25/23 0119  WBC 5.3 5.7 5.6 5.6  HGB 7.1* 7.0* 7.3* 7.3*  HCT 23.0* 21.8* 22.8* 23.0*  MCV 90.9 89.0 88.7 89.1  PLT 212 196 209 219   Basic Metabolic Panel: Recent Labs  Lab 01/21/23 1543 01/22/23 0037 01/23/23 0035 01/25/23 0119 01/26/23 0118  NA 141 141 142 140 136  K 5.2* 4.3 4.6 5.2* 5.2*  CL 110 111 109 104 99  CO2 19* 21* 24 25 24   GLUCOSE 137* 162* 193* 128* 144*  BUN 55* 52* 57* 71* 85*  CREATININE 5.25* 5.36* 5.61* 6.39* 7.14*  CALCIUM 8.7* 8.4* 8.2* 7.8* 8.2*  MG 1.8  --   --   --   --      No results found for this or  any previous visit (from the past 240 hour(s)).   Radiology Studies: No results found.  Scheduled Meds:  sodium chloride   Intravenous Once   amLODipine  10 mg Oral Daily   calcium carbonate  1 tablet Oral BID   carvedilol  12.5 mg Oral BID WC   docusate sodium  100 mg Oral BID   heparin  5,000 Units Subcutaneous Q8H   insulin aspart  0-9 Units Subcutaneous TID WC   isosorbide mononitrate  30 mg Oral Daily   LORazepam  2 mg Intramuscular Once   pantoprazole  20 mg Oral BID   QUEtiapine  50 mg Oral QHS   rosuvastatin  40 mg Oral Daily   sodium zirconium cyclosilicate  10 g Oral Daily   thiamine  100 mg Oral Daily   Continuous Infusions:     LOS: 2 days   Burnadette Pop, MD [facility] P6/11/2022, 11:13 AM

## 2023-01-26 NOTE — Progress Notes (Signed)
Patient verbally upset this morning because staff is "holding me against my will." This nurse, nurse tech/sitter, and unit director spoke with patient. When this nurse tried to ask the patient questions, she did not speak. Allowed this nurse to complete assessment. At this time, patient asleep with sitter at bedside. Will attempt medications when patient awakens.

## 2023-01-26 NOTE — Progress Notes (Signed)
[facility] NEPHROLOGY PROGRESS NOTE  Assessment/ Plan: Pt is a 72 y.o. yo female with past medical history significant for HTN, DM, CAD, CHF, CKD stage V who was admitted on 01/21/2023 with anemia in the setting of stage 5 CKD.  # CKD stage V: Noted the patient has CKD  stage V at baseline and follows with [facility]. Darylene [facility] at [facility].  The patient was seen by Dr. Kathrene Bongo during this hospitalization who thought that she is not a candidate for dialysis.  Apparently, 3 other physician from [facility] felt that the patient is not a candidate for HD because of nonadherence, poor insight accompanied by angry and agitated behavior.  Patient had also expressed to Dr. Marisue Humble previously that she did not desire dialysis. I agree she won't be able to do dialysis unless her behavior, agitation etc improves significantly.   The behavioral issue appears to be on display while in the hospital therefore she is currently on IVC. We are re-consulted on 6/3 because of elevated creatinine level. I agree with holding diuretics further.  I do not see any overt signs or symptoms of uremia and volume status looks acceptable. I recommended palliative care consult and her care should really focus surrounding symptom management etc.  This is a very challenging and tough situation.   Noted her sister is coming  to the hospital to have a meeting with palliative care. I hopes he can increase oral intake and maintain good hydration.  # Hypertension/volume: Blood pressure and volume status looks acceptable.  Continue to hold diuretics.  Monitor BP.  # Anemia due to CKD: Received iron and ESA in the hospital.  Not sure if she will comply with outpatient ESA management.  # Mild hyperkalemia: Treated with Lokelma.  Recommend low potassium diet  Discussed with the nursing staff and primary team.  Subjective: Seen and examined at bedside.  No exact measurement.  Patient was upset that she is in the hospital.  She  understands that her kidney is not doing well.  She has a bed sitter.  Objective Vital signs in last 24 hours: Vitals:   01/25/23 1555 01/25/23 2002 01/26/23 0659 01/26/23 0700  BP:  99/73 (!) 148/59   Pulse: 64 63 (!) 41 83  Resp: 17 18 16    Temp: 97.8 F (36.6 C) (!) 97.5 F (36.4 C) (!) 97.5 F (36.4 C)   TempSrc: Oral Oral Axillary   SpO2: 100% 100% 100% 96%  Weight:      Height:       Weight change:   Intake/Output Summary (Last 24 hours) at 01/26/2023 1012 Last data filed at 01/26/2023 0740 Gross per 24 hour  Intake 960 ml  Output 550 ml  Net 410 ml        Labs: RENAL PANEL Recent Labs  Lab 01/21/23 1244 01/21/23 1543 01/22/23 0037 01/23/23 0035 01/25/23 0119 01/26/23 0118  NA 142 141 141 142 140 136  K 5.3* 5.2* 4.3 4.6 5.2* 5.2*  CL 112* 110 111 109 104 99  CO2 21* 19* 21* 24 25 24   GLUCOSE 164* 137* 162* 193* 128* 144*  BUN 54* 55* 52* 57* 71* 85*  CREATININE 5.41* 5.25* 5.36* 5.61* 6.39* 7.14*  CALCIUM 8.5* 8.7* 8.4* 8.2* 7.8* 8.2*  MG  --  1.8  --   --   --   --   ALBUMIN 2.8*  --   --   --   --   --      Liver  Function Tests: Recent Labs  Lab 01/21/23 1244  AST 16  ALT 13  ALKPHOS 52  BILITOT 0.5  PROT 5.7*  ALBUMIN 2.8*    No results for input(s): "LIPASE", "AMYLASE" in the last 168 hours. No results for input(s): "AMMONIA" in the last 168 hours. CBC: Recent Labs    04/18/22 0154 04/23/22 0023 11/06/22 1929 11/07/22 0446 11/08/22 0410 12/22/22 0000 12/22/22 0715 01/03/23 1151 01/21/23 1244 01/21/23 1543 01/21/23 1808 01/22/23 0037 01/23/23 0035 01/25/23 0119  HGB 8.1*   < >  --  9.8*   < >  --    < > 8.7* 7.1*  --   --  7.0* 7.3* 7.3*  MCV 91.3   < >  --  89.0   < >  --    < > 91.3 90.9  --   --  89.0 88.7 89.1  VITAMINB12 242  --   --  231  --  1,019*  --   --   --  471  --   --   --   --   FOLATE 17.0  --   --  13.0  --  16.9  --   --   --  18.0  --   --   --   --   FERRITIN 24  --  21  --   --  14  --   --   --  16   --   --   --   --   TIBC 295  --  300  --   --  322  --   --   --  361  --   --   --   --   IRON 47  --  33  --   --  57  --   --   --  42  --   --   --   --   RETICCTPCT  --   --   --  1.0  --  1.5  --   --   --   --  1.5  --   --   --    < > = values in this interval not displayed.     Cardiac Enzymes: No results for input(s): "CKTOTAL", "CKMB", "CKMBINDEX", "TROPONINI" in the last 168 hours. CBG: Recent Labs  Lab 01/25/23 0623 01/25/23 1114 01/25/23 1606 01/25/23 2117 01/26/23 0712  GLUCAP 88 205* 162* 177* 105*     Iron Studies: No results for input(s): "IRON", "TIBC", "TRANSFERRIN", "FERRITIN" in the last 72 hours. Studies/Results: No results found.  Medications: Infusions:   Scheduled Medications:  sodium chloride   Intravenous Once   amLODipine  10 mg Oral Daily   calcium carbonate  1 tablet Oral BID   carvedilol  12.5 mg Oral BID WC   docusate sodium  100 mg Oral BID   heparin  5,000 Units Subcutaneous Q8H   insulin aspart  0-9 Units Subcutaneous TID WC   isosorbide mononitrate  30 mg Oral Daily   LORazepam  2 mg Intramuscular Once   pantoprazole  20 mg Oral BID   QUEtiapine  50 mg Oral QHS   rosuvastatin  40 mg Oral Daily   sodium zirconium cyclosilicate  10 g Oral Daily   thiamine  100 mg Oral Daily    have reviewed scheduled and prn medications.  Physical Exam: General:NAD, comfortable, able to lie flat Heart:RRR, s1s2 nl Lungs:clear b/l, no crackle Abdomen:soft, Non-tender, non-distended Extremities: Trace  lower extremities edema Neurology: Alert awake and tangential thought  Teresa Poole Teresa Poole 01/26/2023,10:12 AM  LOS: 2 days

## 2023-01-26 NOTE — Progress Notes (Signed)
Daily Progress Note   Patient Name: Teresa Poole       Date: 01/26/2023 DOB: 12-07-1950  Age: 72 y.o. MRN#: [id number] Attending Physician: Burnadette Pop, MD Primary Care Physician: Malka So., MD Admit Date: 01/21/2023  Reason for Consultation/Follow-up: Establishing goals of care  Subjective: Medical records reviewed including progress notes, labs, and imaging. Patient assessed at the bedside. She is sleeping comfortably with bedside sitter present. Did not attempt to arouse given ongoing difficulty with agitation. No family present.  Called patient's sister to confirm today's family meeting scheduled at 11am. [name] states that she will be cancelling our meeting due to feeling sick today, though she is back home in [place]. I offered to reach back out tomorrow to re-schedule if she is feeling better and she states that she has an oncology appointment tomorrow. She would prefer to call me after her appointment.    Questions and concerns addressed. PMT will continue to support holistically.   Length of Stay: 2  Physical Exam Vitals and nursing note reviewed.  Constitutional:      General: She is sleeping. She is not in acute distress. Cardiovascular:     Rate and Rhythm: Normal rate.  Pulmonary:     Effort: Pulmonary effort is normal.            Vital Signs: BP (!) 148/59 (BP Location: Right Arm)   Pulse 83   Temp (!) 97.5 F (36.4 C) (Axillary)   Resp 16   Ht 5\' 7"  (1.702 m)   Wt 77.6 kg   SpO2 96%   BMI 26.79 kg/m  SpO2: SpO2: 96 % O2 Device: O2 Device: Room Air O2 Flow Rate:       Palliative Care Assessment & Plan   Patient Profile: 72 y.o. female  with past medical history of combined systolic/diastolic CHF, CKD stage IV, hypertension, diabetes type 2,  coronary artery disease, obesity, renal artery stenosis admitted on 01/21/2023 with abnormal labs at PCP visit.    Patient was found to have a hemoglobin of 7 at PCPs office.  Upon further evaluation in the ED, patient was admitted for acute exacerbation of CHF and acute kidney injury on CKD stage V. PMT has been consulted to assist with goals of care conversation.  Assessment: AKI on CKD stage IV Acute on chronic  combined systolic/diastolic CHF Goals of care conversation  Recommendations/Plan: Continue full code/full scope treatment Patient's sister is unable to attend today's family meeting due to illness. Will re-attempt to schedule tomorrow PMT will continue to follow and support   Prognosis:  Unable to determine  Discharge Planning: To Be Determined  Care plan was discussed with Patient's sister, Dr. Renford Dills   Total time: I spent 35 minutes in the care of the patient today in the above activities and documenting the encounter.   Michaela Broski Jeni Salles, PA-C  Palliative Medicine Team Team phone # [phone]  Thank you for allowing the Palliative Medicine Team to assist in the care of this patient. Please utilize secure chat with additional questions, if there is no response within 30 minutes please call the above phone number.  Palliative Medicine Team providers are available by phone from 7am to 7pm daily and can be reached through the team cell phone.  Should this patient require assistance outside of these hours, please call the patient's attending physician.

## 2023-01-27 ENCOUNTER — Encounter: Payer: Self-pay | Admitting: Interventional Cardiology

## 2023-01-27 ENCOUNTER — Ambulatory Visit: Payer: Medicare PPO | Attending: Internal Medicine

## 2023-01-27 DIAGNOSIS — I5043 Acute on chronic combined systolic (congestive) and diastolic (congestive) heart failure: Secondary | ICD-10-CM | POA: Diagnosis not present

## 2023-01-27 DIAGNOSIS — Z7189 Other specified counseling: Secondary | ICD-10-CM | POA: Diagnosis not present

## 2023-01-27 DIAGNOSIS — G3184 Mild cognitive impairment, so stated: Secondary | ICD-10-CM | POA: Diagnosis not present

## 2023-01-27 DIAGNOSIS — N179 Acute kidney failure, unspecified: Secondary | ICD-10-CM | POA: Diagnosis not present

## 2023-01-27 DIAGNOSIS — I509 Heart failure, unspecified: Secondary | ICD-10-CM | POA: Diagnosis not present

## 2023-01-27 DIAGNOSIS — F05 Delirium due to known physiological condition: Secondary | ICD-10-CM | POA: Diagnosis not present

## 2023-01-27 LAB — COMPREHENSIVE METABOLIC PANEL
ALT: 28 U/L (ref 0–44)
AST: 34 U/L (ref 15–41)
Albumin: 3.1 g/dL — ABNORMAL LOW (ref 3.5–5.0)
Alkaline Phosphatase: 52 U/L (ref 38–126)
Anion gap: 15 (ref 5–15)
BUN: 92 mg/dL — ABNORMAL HIGH (ref 8–23)
CO2: 21 mmol/L — ABNORMAL LOW (ref 22–32)
Calcium: 8.8 mg/dL — ABNORMAL LOW (ref 8.9–10.3)
Chloride: 104 mmol/L (ref 98–111)
Creatinine, Ser: 7.65 mg/dL — ABNORMAL HIGH (ref 0.44–1.00)
GFR, Estimated: 5 mL/min — ABNORMAL LOW (ref >60)
Glucose, Bld: 162 mg/dL — ABNORMAL HIGH (ref 70–99)
Potassium: 4.6 mmol/L (ref 3.5–5.1)
Sodium: 140 mmol/L (ref 135–145)
Total Bilirubin: 0.4 mg/dL (ref 0.3–1.2)
Total Protein: 6.4 g/dL — ABNORMAL LOW (ref 6.5–8.1)

## 2023-01-27 LAB — GLUCOSE, CAPILLARY
Glucose-Capillary: 198 mg/dL — ABNORMAL HIGH (ref 70–99)
Glucose-Capillary: 82 mg/dL (ref 70–99)
Glucose-Capillary: 90 mg/dL (ref 70–99)
Glucose-Capillary: 168 mg/dL — ABNORMAL HIGH (ref 70–99)
Glucose-Capillary: 156 mg/dL — ABNORMAL HIGH (ref 70–99)
Glucose-Capillary: 214 mg/dL — ABNORMAL HIGH (ref 70–99)
Glucose-Capillary: 146 mg/dL — ABNORMAL HIGH (ref 70–99)

## 2023-01-27 LAB — AMMONIA: Ammonia: 27 umol/L (ref 9–35)

## 2023-01-27 MED ORDER — POLYETHYLENE GLYCOL 3350 17 G PO PACK
17.0000 g | PACK | Freq: Every day | ORAL | Status: DC
  Administered 2023-01-28: 17 g via ORAL
  Filled 2023-01-27: qty 1

## 2023-01-27 MED ORDER — VALPROATE SODIUM 100 MG/ML IV SOLN
250.0000 mg | Freq: Two times a day (BID) | INTRAVENOUS | Status: DC
  Administered 2023-01-28: 250 mg via INTRAVENOUS
  Filled 2023-01-27 (×6): qty 2.5

## 2023-01-27 MED ORDER — HALOPERIDOL LACTATE 5 MG/ML IJ SOLN: 3.0000 mg | Freq: Four times a day (QID) | INTRAMUSCULAR | Status: DC | PRN

## 2023-01-27 MED ORDER — HALOPERIDOL LACTATE 5 MG/ML IJ SOLN
3.0000 mg | Freq: Four times a day (QID) | INTRAMUSCULAR | Status: DC | PRN
  Administered 2023-01-27: 3 mg via INTRAMUSCULAR
  Filled 2023-01-27: qty 1

## 2023-01-27 MED ORDER — HALOPERIDOL LACTATE 5 MG/ML IJ SOLN: 1.0000 mg | INTRAMUSCULAR | Status: DC | PRN

## 2023-01-27 MED ORDER — HALOPERIDOL 1 MG PO TABS
1.0000 mg | ORAL_TABLET | ORAL | Status: DC | PRN
  Administered 2023-01-27 – 2023-01-29 (×3): 1 mg via ORAL
  Filled 2023-01-27 (×4): qty 1

## 2023-01-27 MED ORDER — SENNA 8.6 MG PO TABS
1.0000 | ORAL_TABLET | Freq: Every day | ORAL | Status: DC
  Administered 2023-01-28: 8.6 mg via ORAL
  Filled 2023-01-27: qty 1

## 2023-01-27 NOTE — Progress Notes (Signed)
[facility] NEPHROLOGY PROGRESS NOTE  Assessment/ Plan: Pt is a 72 y.o. yo female with past medical history significant for HTN, DM, CAD, CHF, CKD stage V who was admitted on 01/21/2023 with anemia in the setting of stage 5 CKD.  # CKD stage V: Noted the patient has CKD  stage V at baseline and follows with Dr. Darylene Price at [facility].  The patient was seen by Dr. Kathrene Bongo during this hospitalization who thought that she is not a candidate for dialysis.  Apparently, 3 other physician from [facility] felt that the patient is not a candidate for HD because of nonadherence, poor insight accompanied by angry and agitated behavior.  Patient had also expressed to Dr. Marisue Humble previously that she did not desire dialysis. I agree she won't be able to do dialysis unless her behavior, agitation etc improves significantly.   The behavioral issue appears to be on display while in the hospital therefore she is currently on IVC. We are re-consulted on 6/3 because of elevated creatinine level. I agree with holding diuretics further.  I do not see any overt signs or symptoms of uremia and volume status looks acceptable.  Palliative care was already consulted and her care should really focus surrounding symptom management etc.  This is a very challenging and tough situation.   Noted her sister is coming  to the hospital to have a meeting with palliative care. I hopes he can increase oral intake and maintain good hydration. Pending labs from today.  # Hypertension/volume: Blood pressure and volume status looks acceptable.  Continue to hold diuretics.  Monitor BP.  # Anemia due to CKD: Received iron and ESA in the hospital.  Not sure if she will comply with outpatient ESA management.  # Mild hyperkalemia: Treating with Lokelma.  Recommend low potassium diet  Subjective: Seen and examined at bedside.  No new event.  Noted palliative care team is planning to meet the sister today.  Patient was sleeping  comfortable this morning.  Bed sitter was presented. Objective Vital signs in last 24 hours: Vitals:   01/26/23 0700 01/26/23 1100 01/26/23 2144 01/27/23 0554  BP:  (!) 125/56 (!) 127/59 (!) 116/54  Pulse: 83  66 65  Resp:  16 18 18   Temp:   98.1 F (36.7 C) 97.9 F (36.6 C)  TempSrc:   Oral Oral  SpO2: 96%  100% 100%  Weight:      Height:       Weight change:  No intake or output data in the 24 hours ending 01/27/23 0755      Labs: RENAL PANEL Recent Labs  Lab 01/21/23 1244 01/21/23 1543 01/22/23 0037 01/23/23 0035 01/25/23 0119 01/26/23 0118  NA 142 141 141 142 140 136  K 5.3* 5.2* 4.3 4.6 5.2* 5.2*  CL 112* 110 111 109 104 99  CO2 21* 19* 21* 24 25 24   GLUCOSE 164* 137* 162* 193* 128* 144*  BUN 54* 55* 52* 57* 71* 85*  CREATININE 5.41* 5.25* 5.36* 5.61* 6.39* 7.14*  CALCIUM 8.5* 8.7* 8.4* 8.2* 7.8* 8.2*  MG  --  1.8  --   --   --   --   ALBUMIN 2.8*  --   --   --   --   --      Liver Function Tests: Recent Labs  Lab 01/21/23 1244  AST 16  ALT 13  ALKPHOS 52  BILITOT 0.5  PROT 5.7*  ALBUMIN 2.8*    No results for  input(s): "LIPASE", "AMYLASE" in the last 168 hours. No results for input(s): "AMMONIA" in the last 168 hours. CBC: Recent Labs    04/18/22 0154 04/23/22 0023 11/06/22 1929 11/07/22 0446 11/08/22 0410 12/22/22 0000 12/22/22 0715 01/03/23 1151 01/21/23 1244 01/21/23 1543 01/21/23 1808 01/22/23 0037 01/23/23 0035 01/25/23 0119  HGB 8.1*   < >  --  9.8*   < >  --    < > 8.7* 7.1*  --   --  7.0* 7.3* 7.3*  MCV 91.3   < >  --  89.0   < >  --    < > 91.3 90.9  --   --  89.0 88.7 89.1  VITAMINB12 242  --   --  231  --  1,019*  --   --   --  471  --   --   --   --   FOLATE 17.0  --   --  13.0  --  16.9  --   --   --  18.0  --   --   --   --   FERRITIN 24  --  21  --   --  14  --   --   --  16  --   --   --   --   TIBC 295  --  300  --   --  322  --   --   --  361  --   --   --   --   IRON 47  --  33  --   --  57  --   --   --  42   --   --   --   --   RETICCTPCT  --   --   --  1.0  --  1.5  --   --   --   --  1.5  --   --   --    < > = values in this interval not displayed.     Cardiac Enzymes: No results for input(s): "CKTOTAL", "CKMB", "CKMBINDEX", "TROPONINI" in the last 168 hours. CBG: Recent Labs  Lab 01/26/23 0712 01/26/23 1148 01/26/23 1702 01/26/23 2150 01/27/23 0556  GLUCAP 105* 92 198* 82 90     Iron Studies: No results for input(s): "IRON", "TIBC", "TRANSFERRIN", "FERRITIN" in the last 72 hours. Studies/Results: No results found.  Medications: Infusions:   Scheduled Medications:  sodium chloride   Intravenous Once   amLODipine  10 mg Oral Daily   calcium carbonate  1 tablet Oral BID   carvedilol  12.5 mg Oral BID WC   docusate sodium  100 mg Oral BID   heparin  5,000 Units Subcutaneous Q8H   insulin aspart  0-9 Units Subcutaneous TID WC   isosorbide mononitrate  30 mg Oral Daily   LORazepam  2 mg Intramuscular Once   pantoprazole  20 mg Oral BID   QUEtiapine  50 mg Oral QHS   rosuvastatin  40 mg Oral Daily   sodium zirconium cyclosilicate  10 g Oral Daily   thiamine  100 mg Oral Daily    have reviewed scheduled and prn medications.  Physical Exam: General:NAD,  sleeping comfortable. Heart:RRR, s1s2 nl Lungs:clear b/l, no crackle Abdomen:soft, Non-tender, non-distended Extremities: Trace lower extremities edema Neurology: Alert awake and tangential thought  Daran Favaro Prasad Catherine Cubero 01/27/2023,7:55 AM  LOS: 3 days

## 2023-01-27 NOTE — Progress Notes (Signed)
This RN entered room to administer morning medications.  Patient was standing in the doorway screaming at the nurse tech that was in the hallway charting calling her a bitch and a heifer.  Patient was asked to please go back in the room so she could take her meds.  Patient responded "Im not taking any meds go fuck yourseft" and threw a full cup of water on this nurse.  Patient continued to scream yell and cuss at staff and threw her breakfast tray at charge nurse Vikki Ports.  Security was called, MD was made aware of situation and order to put patient in 4 point restraints was obtained.  Sitter will remain at bedside.

## 2023-01-27 NOTE — Progress Notes (Signed)
Patient refused AM blood sugar check.
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
## 2023-01-27 NOTE — Care Management Important Message (Signed)
Important Message  Patient Details  Name: Teresa Poole MRN: [id number] Date of Birth: 1951-01-01   Medicare Important Message Given:  Yes     Dorena Bodo 01/27/2023, 2:42 PM

## 2023-01-27 NOTE — Progress Notes (Signed)
Patient refuses assessment at this time, appears agitated and is insisting on leaving.  Patient reminded that the doctor will be coming to see her and he will talk to her about her plan of care moving forward

## 2023-01-27 NOTE — Consult Note (Cosign Needed Addendum)
[facility] Face-to-Face Psychiatry Consult   Reason for Consult: Noncompliant and mildly agitated. Referring Physician: Hospitalist. Patient Identification: Teresa Poole MRN:  [id number] Principal Diagnosis: Acute exacerbation of CHF (congestive heart failure) (HCC) Diagnosis:  Principal Problem:   Acute exacerbation of CHF (congestive heart failure) (HCC) Active Problems:   Hypertension   Type 2 diabetes mellitus with chronic kidney disease, without long-term current use of insulin (HCC)   Acute renal failure superimposed on stage 4 chronic kidney disease (HCC)   Anemia   Delirium due to another medical condition   MCI (mild cognitive impairment)   Total Time spent with patient: 1 hour  I personally spent 55 minutes on direct patient care. The direct patient care time included face-to-face time with the patient, reviewing the patient's chart, communicating with other professionals, and coordinating care. Greater than 50% of this time was spent in counseling or coordinating care with the patient regarding goals of hospitalization, medication, lab review, and discharge planning needs.   Subjective:   Teresa Poole is a 72 y.o. female patient admitted with  CKD 5 (I see at [facility]), hypertension, DM2, combined chronic systolic and diastolic heart failure, CAD, history of renal artery stenosis who presented to the hospital earlier today at the suggestion of PCP because of anemia.  Consult was for agitation.  HPI: Most of the information was primarily obtained from the records and patient interview.  The patient reports that she drove herself to the hospital because she was sick. Apparently she had shortness of breath and worsening edema.  She consented for treatment and was admitted to the medical floor.  Initially patient was noted to be fairly pleasant but records indicate that she has a history of noncompliance with medications.  She has not taken any of her home medications. Patient was on  Seroquel which was held initially but subsequently because of increasing agitation it was restarted.    On assessment today: Patient was seen and assessed prior to her becoming irate and disgruntled. Patient was initially very pleasant, laughing and cordial; within moments she was verbally agitated, cursing, swearing words and adamant about going home. When questioned if she was capable taking care of self, she became very defensive and demanded to leave. Writer also asked about speaking with her sister, in which she replied " I am going the fuck home.Yall aint doing shit for me. " Patient continues to lack capacity to discharge home as she presents with multiple safety concerns for self and others.   Unclear if this is delirium or patients baseline. While very she is very agitated and persistent and erratic mood swings, she does not present with disturbed sensorium, decreased concentration or attention,or comatose. Unfortunately she can not participate or remain focus long enough in a formal evaluation or cognitive screening to get valid scoring.      Past Psychiatric History: This is primarily obtained from the records.  Patient appears to have a history of neurocognitive disorder and has been diagnosed with Alzheimer's dementia.  There is at least 1 incident in the records that patient has some behavioral consequences of dementia previously.  Risk to Self:  None noted Risk to Others:  Mild possibly related to agitation. Prior Inpatient Therapy:  None noted Prior Outpatient Therapy:  Seen by a neurologist before.  Past Medical History:  Past Medical History:  Diagnosis Date   Allergy    Anemia    Carotid stenosis    Chronic combined systolic and diastolic CHF (congestive heart failure) (  HCC)    CKD (chronic kidney disease), stage IV (HCC) 09/24/2013   Pt at [facility], Dr. Marisue Humble   COVID-19 10/24/2021   Diabetes mellitus type 2 in nonobese [facility])    Edema 09/14/2013   GERD  (gastroesophageal reflux disease)    History of ovarian cancer    Hyperlipidemia    Hypertension    Mild CAD    non-obstructive by LHC (09/25/2013): Proximal and mid LAD serial 20%, proximal circumflex 30%, mid AV groove circumflex 30%, mid RCA mild plaque.   NICM (nonischemic cardiomyopathy) (HCC)    Obesity (BMI 30-39.9)    RBBB    Renal artery stenosis (HCC) 10/11/2018   Thyroid disease    Seen by specialist    Past Surgical History:  Procedure Laterality Date   BIOPSY  12/22/2022   Procedure: BIOPSY;  Surgeon: Shellia Cleverly, DO;  Location: [facility] ENDOSCOPY;  Service: Gastroenterology;;   ESOPHAGOGASTRODUODENOSCOPY N/A 12/22/2022   Procedure: ESOPHAGOGASTRODUODENOSCOPY (EGD);  Surgeon: Shellia Cleverly, DO;  Location: [facility] ENDOSCOPY;  Service: Gastroenterology;  Laterality: N/A;   LEFT HEART CATHETERIZATION WITH CORONARY ANGIOGRAM N/A 09/25/2013   Procedure: LEFT HEART CATHETERIZATION WITH CORONARY ANGIOGRAM;  Surgeon: Kathleene Hazel, MD;  Location: [facility] CATH LAB;  Service: Cardiovascular;  Laterality: N/A;   RENAL ANGIOGRAPHY N/A 10/06/2018   Procedure: RENAL ANGIOGRAPHY;  Surgeon: Cephus Shelling, MD;  Location: [facility] INVASIVE CV LAB;  Service: Cardiovascular;  Laterality: N/A;   Family History:  Family History  Problem Relation Age of Onset   Diabetes Father    Hypertension Father    Hypertension Maternal Grandfather    Family Psychiatric  History: Unknown at this time.  Patient apparently has a sister but she is unavailable for additional history. Social History:  Social History   Substance and Sexual Activity  Alcohol Use No     Social History   Substance and Sexual Activity  Drug Use No    Social History   Socioeconomic History   Marital status: Single    Spouse name: Not on file   Number of children: 0   Years of education: Not on file   Highest education level: Not on file  Occupational History    Employer: [employer]  Tobacco Use   Smoking  status: Never   Smokeless tobacco: Never  Vaping Use   Vaping Use: Never used  Substance and Sexual Activity   Alcohol use: No   Drug use: No   Sexual activity: Not on file  Other Topics Concern   Not on file  Social History Narrative   Works at [employer]   Patient lives at home alone.    Patient has no children.    Patient patient is right handed.    Patient is single.    Social Determinants of Health   Financial Resource Strain: Not on file  Food Insecurity: No Food Insecurity (01/21/2023)   Hunger Vital Sign    Worried About Running Out of Food in the Last Year: Never true    Ran Out of Food in the Last Year: Never true  Transportation Needs: No Transportation Needs (01/21/2023)   PRAPARE - Administrator, Civil Service (Medical): No    Lack of Transportation (Non-Medical): No  Physical Activity: Not on file  Stress: Not on file  Social Connections: Not on file   Additional Social History:    Allergies:   Allergies  Allergen Reactions   Hydralazine Hcl Itching  ENTIRE BODY = burning sensation, also in the eyes (they have become very sensitive to light)   Atorvastatin Other (See Comments)    Per MD - pt not sure of reaction     Labs:  Results for orders placed or performed during the hospital encounter of 01/21/23 (from the past 48 hour(s))  Glucose, capillary     Status: Abnormal   Collection Time: 01/25/23  4:06 PM  Result Value Ref Range   Glucose-Capillary 162 (H) 70 - 99 mg/dL    Comment: Glucose reference range applies only to samples taken after fasting for at least 8 hours.  Glucose, capillary     Status: Abnormal   Collection Time: 01/25/23  9:17 PM  Result Value Ref Range   Glucose-Capillary 177 (H) 70 - 99 mg/dL    Comment: Glucose reference range applies only to samples taken after fasting for at least 8 hours.   Comment 1 Notify RN   Basic metabolic panel     Status: Abnormal   Collection Time: 01/26/23  1:18 AM  Result Value Ref Range    Sodium 136 135 - 145 mmol/L   Potassium 5.2 (H) 3.5 - 5.1 mmol/L   Chloride 99 98 - 111 mmol/L   CO2 24 22 - 32 mmol/L   Glucose, Bld 144 (H) 70 - 99 mg/dL    Comment: Glucose reference range applies only to samples taken after fasting for at least 8 hours.   BUN 85 (H) 8 - 23 mg/dL   Creatinine, Ser 1.61 (H) 0.44 - 1.00 mg/dL   Calcium 8.2 (L) 8.9 - 10.3 mg/dL   GFR, Estimated 6 (L) >60 mL/min    Comment: (NOTE) Calculated using the CKD-EPI Creatinine Equation (2021)    Anion gap 13 5 - 15    Comment: Performed at [facility], [address] [place], [place] [address]  Glucose, capillary     Status: Abnormal   Collection Time: 01/26/23  7:12 AM  Result Value Ref Range   Glucose-Capillary 105 (H) 70 - 99 mg/dL    Comment: Glucose reference range applies only to samples taken after fasting for at least 8 hours.  Glucose, capillary     Status: None   Collection Time: 01/26/23 11:48 AM  Result Value Ref Range   Glucose-Capillary 92 70 - 99 mg/dL    Comment: Glucose reference range applies only to samples taken after fasting for at least 8 hours.  Glucose, capillary     Status: Abnormal   Collection Time: 01/26/23  5:02 PM  Result Value Ref Range   Glucose-Capillary 198 (H) 70 - 99 mg/dL    Comment: Glucose reference range applies only to samples taken after fasting for at least 8 hours.  Glucose, capillary     Status: None   Collection Time: 01/26/23  9:50 PM  Result Value Ref Range   Glucose-Capillary 82 70 - 99 mg/dL    Comment: Glucose reference range applies only to samples taken after fasting for at least 8 hours.  Glucose, capillary     Status: None   Collection Time: 01/27/23  5:56 AM  Result Value Ref Range   Glucose-Capillary 90 70 - 99 mg/dL    Comment: Glucose reference range applies only to samples taken after fasting for at least 8 hours.  Glucose, capillary     Status: Abnormal   Collection Time: 01/27/23  9:23 AM  Result Value Ref Range    Glucose-Capillary 168 (H) 70 - 99  mg/dL    Comment: Glucose reference range applies only to samples taken after fasting for at least 8 hours.  Glucose, capillary     Status: Abnormal   Collection Time: 01/27/23 12:13 PM  Result Value Ref Range   Glucose-Capillary 156 (H) 70 - 99 mg/dL    Comment: Glucose reference range applies only to samples taken after fasting for at least 8 hours.    Current Facility-Administered Medications  Medication Dose Route Frequency Provider Last Rate Last Admin   0.9 %  sodium chloride infusion (Manually program via Guardrails IV Fluids)   Intravenous Once Burnadette Pop, MD       amLODipine (NORVASC) tablet 10 mg  10 mg Oral Daily Burnadette Pop, MD   10 mg at 01/27/23 1305   calcium carbonate (TUMS - dosed in mg elemental calcium) chewable tablet 200 mg of elemental calcium  1 tablet Oral BID Burnadette Pop, MD   200 mg of elemental calcium at 01/27/23 1304   carvedilol (COREG) tablet 12.5 mg  12.5 mg Oral BID WC Adhikari, Amrit, MD   12.5 mg at 01/26/23 1744   docusate sodium (COLACE) capsule 100 mg  100 mg Oral BID Burnadette Pop, MD   100 mg at 01/27/23 1305   haloperidol lactate (HALDOL) injection 3 mg  3 mg Intramuscular Q6H PRN Burnadette Pop, MD   3 mg at 01/27/23 1302   heparin injection 5,000 Units  5,000 Units Subcutaneous Q8H Adhikari, Amrit, MD   5,000 Units at 01/27/23 0556   insulin aspart (novoLOG) injection 0-9 Units  0-9 Units Subcutaneous TID WC Burnadette Pop, MD   2 Units at 01/26/23 1746   isosorbide mononitrate (IMDUR) 24 hr tablet 30 mg  30 mg Oral Daily Adhikari, Amrit, MD   30 mg at 01/27/23 1306   LORazepam (ATIVAN) injection 2 mg  2 mg Intramuscular Once Crosley, Debby, MD       Oral care mouth rinse  15 mL Mouth Rinse PRN Adhikari, Amrit, MD       pantoprazole (PROTONIX) EC tablet 20 mg  20 mg Oral BID Burnadette Pop, MD   20 mg at 01/26/23 2139   polyethylene glycol (MIRALAX / GLYCOLAX) packet 17 g  17 g Oral Daily Adhikari,  Amrit, MD       QUEtiapine (SEROQUEL) tablet 25 mg  25 mg Oral BID PRN Burnadette Pop, MD   25 mg at 01/26/23 1304   QUEtiapine (SEROQUEL) tablet 50 mg  50 mg Oral QHS Crosley, Debby, MD   50 mg at 01/26/23 2139   rosuvastatin (CRESTOR) tablet 40 mg  40 mg Oral Daily Burnadette Pop, MD   40 mg at 01/27/23 1305   senna (SENOKOT) tablet 8.6 mg  1 tablet Oral Daily Adhikari, Amrit, MD       sodium zirconium cyclosilicate (LOKELMA) packet 10 g  10 g Oral Daily Maxie Barb, MD   10 g at 01/27/23 1306   thiamine (VITAMIN B1) tablet 100 mg  100 mg Oral Daily Burnadette Pop, MD   100 mg at 01/27/23 1303    Musculoskeletal: Strength & Muscle Tone:  Not tested patient was lying in bed. Gait & Station:  Not tested patient was lying in bed. Patient leans:  Not tested.            Psychiatric Specialty Exam:  Presentation  General Appearance:  Appropriate for Environment; Casual  Eye Contact: Fair  Speech: Clear and Coherent; Normal Rate  Speech Volume: Normal  Handedness: Right  Mood and Affect  Mood: Anxious; Labile  Affect: Appropriate; Congruent   Thought Process  Thought Processes: Coherent; Linear  Descriptions of Associations:Intact  Orientation:Full (Time, Place and Person)  Thought Content:Logical  History of Schizophrenia/Schizoaffective disorder:No data recorded Duration of Psychotic Symptoms:No data recorded Hallucinations:Hallucinations: None  Ideas of Reference:None  Suicidal Thoughts:Suicidal Thoughts: No  Homicidal Thoughts:Homicidal Thoughts: No   Sensorium  Memory: Immediate Poor; Recent Poor; Remote Poor  Judgment: Poor  Insight: Poor   Executive Functions  Concentration: Poor  Attention Span: Fair  Recall: Poor  Fund of Knowledge: Fair  Language: Fair   Psychomotor Activity  Psychomotor Activity: Psychomotor Activity: -- (psychomotor agitation)   Assets  Assets: Manufacturing systems engineer; Social  Support; Resilience; Physical Health   Sleep  Sleep: Sleep: Fair   Physical Exam: Physical Exam Vitals and nursing note reviewed.  Constitutional:      Appearance: Normal appearance. She is normal weight.  Skin:    Capillary Refill: Capillary refill takes less than 2 seconds.  Neurological:     General: No focal deficit present.     Mental Status: She is alert. Mental status is at baseline. She is disoriented.  Psychiatric:        Attention and Perception: Perception normal. She is inattentive.        Mood and Affect: Mood is anxious. Affect is labile.        Speech: Speech is rapid and pressured.        Behavior: Behavior is agitated.        Thought Content: Thought content normal.        Cognition and Memory: Cognition is impaired. Memory is impaired. She exhibits impaired recent memory and impaired remote memory.        Judgment: Judgment is impulsive.    Review of Systems  Psychiatric/Behavioral: Negative.  Negative for hallucinations.   All other systems reviewed and are negative.  Blood pressure (!) 140/59, pulse 72, temperature 98.1 F (36.7 C), resp. rate 18, height 5\' 7"  (1.702 m), weight 77.6 kg, SpO2 100 %. Body mass index is 26.79 kg/m.  Treatment Plan Summary: Daily contact with patient to assess and evaluate symptoms and progress in treatment and Medication management   Impression: Mild delirium due to general medical condition with agitation.  Mild to moderate neurocognitive disorder  Recommendations: -Continue with stabilization of her medical condition including optimization of former metabolic syndrome. -Continue delirium precautions -Patient lacks capacity at this time, considering reaching out to next of kin.  -DC  Seroquel at this time, risk for QTc prolongation and otherwise has been ineffective over 6 day course of admission.  -Will start Depacon IV 250mg  BID for dementia with agitation.  Will obtain CMP (liver levels normal) and Ammonia (27)  levels.  - Haldol 1mg  IV/PO q3 hours.  -Consider PMT consult.  -SLP consult ordered for cognitive evaluation. -Considering her labs, inadequate candidate for dialysis, lacking capacity, agitation and MCI that may lead to increase in psychotropic medication administration, consider Ethics consult to ensure we are providing the best level of care for this patient who can potentially develop cardiac event or complications from the above risks.   Last EKG obtained today, QTc 503 using Federica model. Patient with multiple risk factors for QTc prolongation to include cardiac abnormalities,CKD Stage V, female, age >8, 2 Psychotropics that may prolong qtc, and electrolyte abnormalities. Will d/c Seroquel at this time due to recent QTc of 503. Can consider daily EKGs (or telemetry if patient agrees) to monitor  for close improvement. Once QTc improves consider starting zyprexa 2.5mg  po qhs.    Labs obtained and reviewed include increasing creatinine 7.65, BUN (92), albumin (3.1). Ammonia and liver transaminase are WNL.   Disposition: Psychiatric consult will continue to follow from a distance to target reduction in agitated behaviors. Although there are minimal options for a patient with suspected dementia, agitation, verbal aggression, and threatening.   Maryagnes Amos, FNP 01/27/2023 1:19 PM

## 2023-01-27 NOTE — Care Management Important Message (Signed)
Important Message  Patient Details  Name: Teresa Poole MRN: [id number] Date of Birth: August 19, 1951   Medicare Important Message Given:  No  Patient would not sign she just wanted to go home   Dorena Bodo 01/27/2023, 2:42 PM

## 2023-01-28 DIAGNOSIS — Z515 Encounter for palliative care: Secondary | ICD-10-CM

## 2023-01-28 DIAGNOSIS — Z711 Person with feared health complaint in whom no diagnosis is made: Secondary | ICD-10-CM

## 2023-01-28 DIAGNOSIS — Z66 Do not resuscitate: Secondary | ICD-10-CM

## 2023-01-28 DIAGNOSIS — I509 Heart failure, unspecified: Secondary | ICD-10-CM | POA: Diagnosis not present

## 2023-01-28 DIAGNOSIS — Z789 Other specified health status: Secondary | ICD-10-CM

## 2023-01-28 DIAGNOSIS — F05 Delirium due to known physiological condition: Secondary | ICD-10-CM | POA: Diagnosis not present

## 2023-01-28 DIAGNOSIS — I5043 Acute on chronic combined systolic (congestive) and diastolic (congestive) heart failure: Secondary | ICD-10-CM | POA: Diagnosis not present

## 2023-01-28 LAB — GLUCOSE, CAPILLARY
Glucose-Capillary: 131 mg/dL — ABNORMAL HIGH (ref 70–99)
Glucose-Capillary: 158 mg/dL — ABNORMAL HIGH (ref 70–99)
Glucose-Capillary: 232 mg/dL — ABNORMAL HIGH (ref 70–99)
Glucose-Capillary: 107 mg/dL — ABNORMAL HIGH (ref 70–99)

## 2023-01-28 LAB — CBC
HCT: 29.9 % — ABNORMAL LOW (ref 36.0–46.0)
Hemoglobin: 9.4 g/dL — ABNORMAL LOW (ref 12.0–15.0)
MCH: 28 pg (ref 26.0–34.0)
MCHC: 31.4 g/dL (ref 30.0–36.0)
MCV: 89 fL (ref 80.0–100.0)
Platelets: 314 10*3/uL (ref 150–400)
RBC: 3.36 MIL/uL — ABNORMAL LOW (ref 3.87–5.11)
RDW: 14.6 % (ref 11.5–15.5)
WBC: 6 10*3/uL (ref 4.0–10.5)
nRBC: 0 % (ref 0.0–0.2)

## 2023-01-28 LAB — BASIC METABOLIC PANEL
Anion gap: 15 (ref 5–15)
BUN: 85 mg/dL — ABNORMAL HIGH (ref 8–23)
CO2: 23 mmol/L (ref 22–32)
Calcium: 9.2 mg/dL (ref 8.9–10.3)
Chloride: 104 mmol/L (ref 98–111)
Creatinine, Ser: 7.53 mg/dL — ABNORMAL HIGH (ref 0.44–1.00)
GFR, Estimated: 5 mL/min — ABNORMAL LOW (ref >60)
Glucose, Bld: 193 mg/dL — ABNORMAL HIGH (ref 70–99)
Potassium: 4.4 mmol/L (ref 3.5–5.1)
Sodium: 142 mmol/L (ref 135–145)

## 2023-01-28 NOTE — Progress Notes (Signed)
PROGRESS NOTE  Teresa Poole  ZOX:[id number] DOB: Jan 14, 1951 DOA: 01/21/2023 PCP: Malka So., MD   Brief Narrative: TAHJAI Poole is a 72 y.o. female with medical history significant of combined systolic/diastolic CHF, CKD stage IV, hypertension, diabetes type 2, coronary artery  disease, obesity, renal artery stenosis who presented to the emergency department as per suggestion of her PCP for the evaluation low hemoglobin. Patient also mentioned that she has been short of breath and tired for last 1 month.  Patient has been noticing lower extremity edema more than usual.  On presentation, she was found to be volume overloaded and worsened kidney function.  Nephrology consulted.  Started on IV Lasix but now d/ced due to worsening kidney function.  Patient  has cognitive impairment and frequently wants to leave, does not understand her medical problems so psychiatry consulted for determination of capacity.  She was found not to have capacity.   Currently IVCed.  After discussion with family, CODE STATUS changed to DNR.  Sister agreeable for hospice but disposition is uncertain.  Difficult situation regarding disposition  Assessment & Plan:  Principal Problem:   Acute exacerbation of CHF (congestive heart failure) (HCC) Active Problems:   Acute renal failure superimposed on stage 4 chronic kidney disease (HCC)   Hypertension   Type 2 diabetes mellitus with chronic kidney disease, without long-term current use of insulin (HCC)   Anemia   Delirium due to another medical condition   MCI (mild cognitive impairment)  AKI in CKD stage V/hyperkalemia: Baseline creatinine usually  in the range of 4.  Patient presented with creatinine in the range of 5.  She follows with nephrology, Dr. Marisue Humble.  Nephrology consulted . She  also has history of renal artery stenosis. As per nephrology,she is not a candidate for dialysis because of her cognitive issues/possible lack of capacity, noncompliance.   Initially started on IV diuretics.  Kidney function worsened , creatinine trended up , developed hyperkalemia so diuretics held.   Nephrology were following.  Palliative care following.  Acute on chronic combined systolic/diastolic CHF: Appeared volume overloaded on presentation with bilateral lower extremity swelling.  Chest x-ray showed features of pulmonary edema, trace bilateral pleural effusion.  This is mostly secondary to cardiorenal syndrome due to her worsening kidney function.elevated BNP.  Patient does not take any diuretics at home.  She is noncompliant.Started on IV diuresis,now on hold due to worsening kidney function.Last echo has shown EF of 45 to 50% grade 2 diastolic dysfunction.  Normocytic anemia: Hemoglobin was found to be in the range of 7.  Baseline hemoglobin is from 8-9.  No evidence of acute blood loss.  No report of hematochezia or melena.  This is most likely secondary to her progressive CKD.  Patient was admitted for the same in April.  At that time GI was consulted and she underwent EGD with finding of Barrett's esophagus, no active bleeding, recommended to continue Protonix 20 mg twice daily at that time. Given IV iron.  Continue oral iron supplementation on discharge   History of coronary artery disease: Found to have nonobstructive coronary disease as per cardiac cath on 2015   Diabetes type 2: Takes Ozempic at home.  Continue sliding scale insulin here.  Monitor blood sugars   Hypertension: Presented with hypertension.  Takes Imdur, lisinopril, amlodipine, Coreg at home.  Held lisinopril due to AKI.   History of memory impairment/adjustment disorder with anxiety: On Seroquel as per the records.  As per sister, she is noncompliant and  she does not take any medications at home most of the time.  Patient does not have insight of her medical problems.  We requested psychiatry assistance.  Psych currently determined that she does not have capacity.  Currently with a sitter.   She is currently IVCed because she is trying to leave.  We need to figure out safe discharge plan before she is discharged As per report,patient is still driving her car. Psychiatry following, started on valproate. Patient often  declines blood work   History of OSA: Not on CPAP   Hyperlipidemia: She was on statin  Goals of care/disposition: Long discussion held with sister on phone on 6/6.  Marland Kitchen  She had tried to take legal guardianship in the past but the patient did not allow.  The sister also tried to keep Ms. Dois Davenport at her home but she left and becomes violent.  Sister also tried to keep her car keys because her PCP did not recommend her to drive.  But Patient wanted her car keys back and it was given back to her.  Currently on IVCed.  PT/OT said no follow-up required. After discussion with sister and her family, CODE STATUS changed to DNR.  Sister and family are agreeable for hospice.  But not sure where hospice services can be provided because patient lives alone and she does not want anybody to come to her house.   .Sister cannot keep the patient at her house for hospice services.  SNF option looks difficult for her.Patient does not appear to be a candidate for residential hospice.TOC/palliative care following           DVT prophylaxis:Place and maintain sequential compression device Start: 01/25/23 2127 heparin injection 5,000 Units Start: 01/22/23 0600     Code Status: DNR  Family Communication: Called sister on phone on 6/6 Patient status: Inpatient  Patient is from : Home  Anticipated discharge to: We need to figure out safe dc plan. Patient is cognitively impaired, does not have insight of medical problems. Does not have capacity. She lives alone and cannot take care of herself. She has a sister who helps her intermittently.Awaiting a safe discharge plan. Need hospice at home or SNF.  Patient does not appear to be a candidate for residential hospice  Estimated DC date: 1-2  days   Consultants: Nephrology, psychiatry,palliative care  Procedures: None yet  Antimicrobials:  Anti-infectives (From admission, onward)    None       Subjective:  Patient seen and examined at bedside today.  She was walking on the hallway without any problems with sitter.  She looked emotional and tearful and says she wants to go home.  She denies any shortness of breath or cough.  She looks more coherent today.  She knows that her renal function is worsening and is wondering if she needs dialysis.  She is easily distracted, does not know the current month or day  Objective: Vitals:   01/27/23 0554 01/27/23 0926 01/27/23 1555 01/28/23 0737  BP: (!) 116/54 (!) 140/59 (!) 171/52 (!) 156/72  Pulse: 65 72  81  Resp: 18 18 18 20   Temp: 97.9 F (36.6 C) 98.1 F (36.7 C) 98.5 F (36.9 C) 98.3 F (36.8 C)  TempSrc: Oral   Oral  SpO2: 100% 100%  97%  Weight:      Height:        Intake/Output Summary (Last 24 hours) at 01/28/2023 1240 Last data filed at 01/28/2023 0950 Gross per 24 hour  Intake 948 ml  Output --  Net 948 ml    Filed Weights   01/21/23 1225 01/22/23 0428  Weight: 72 kg 77.6 kg    Examination:  General exam: Overall comfortable, not in distress,emotional HEENT: PERRL Respiratory system:  no wheezes or crackles  Cardiovascular system: S1 & S2 heard, RRR.  Gastrointestinal system: Abdomen is nondistended, soft and nontender. Central nervous system: Alert and awake, oriented to place only Extremities: No edema, no clubbing ,no cyanosis Skin: No rashes, no ulcers,no icterus     Data Reviewed: I have personally reviewed following labs and imaging studies  CBC: Recent Labs  Lab 01/21/23 1244 01/22/23 0037 01/23/23 0035 01/25/23 0119 01/28/23 1107  WBC 5.3 5.7 5.6 5.6 6.0  HGB 7.1* 7.0* 7.3* 7.3* 9.4*  HCT 23.0* 21.8* 22.8* 23.0* 29.9*  MCV 90.9 89.0 88.7 89.1 89.0  PLT 212 196 209 219 314   Basic Metabolic Panel: Recent Labs  Lab  01/21/23 1543 01/22/23 0037 01/23/23 0035 01/25/23 0119 01/26/23 0118 01/27/23 1219 01/28/23 1107  NA 141   < > 142 140 136 140 142  K 5.2*   < > 4.6 5.2* 5.2* 4.6 4.4  CL 110   < > 109 104 99 104 104  CO2 19*   < > 24 25 24  21* 23  GLUCOSE 137*   < > 193* 128* 144* 162* 193*  BUN 55*   < > 57* 71* 85* 92* 85*  CREATININE 5.25*   < > 5.61* 6.39* 7.14* 7.65* 7.53*  CALCIUM 8.7*   < > 8.2* 7.8* 8.2* 8.8* 9.2  MG 1.8  --   --   --   --   --   --    < > = values in this interval not displayed.     No results found for this or any previous visit (from the past 240 hour(s)).   Radiology Studies: No results found.  Scheduled Meds:  sodium chloride   Intravenous Once   amLODipine  10 mg Oral Daily   calcium carbonate  1 tablet Oral BID   carvedilol  12.5 mg Oral BID WC   docusate sodium  100 mg Oral BID   heparin  5,000 Units Subcutaneous Q8H   insulin aspart  0-9 Units Subcutaneous TID WC   isosorbide mononitrate  30 mg Oral Daily   LORazepam  2 mg Intramuscular Once   pantoprazole  20 mg Oral BID   polyethylene glycol  17 g Oral Daily   rosuvastatin  40 mg Oral Daily   senna  1 tablet Oral Daily   sodium zirconium cyclosilicate  10 g Oral Daily   thiamine  100 mg Oral Daily   Continuous Infusions:  valproate sodium        LOS: 4 days   Burnadette Pop, MD [facility] P6/01/2023, 12:40 PM

## 2023-01-28 NOTE — Progress Notes (Signed)
PT Cancellation Note  Patient Details Name: Teresa Poole MRN: [id number] DOB: [birth year]   Cancelled Treatment:    Reason Eval/Treat Not Completed: Patient declined, no reason specified. Pt cooperative with RN and sitter this AM. However, declining participation in PT when ask by RN, stating "No, I'm ready to get out of here." Due to previous episodes of quick escalation of agitation, PT did not press the issue of participation. PT to continue to follow as pt allows.   Ilda Foil 01/28/2023, 9:50 AM

## 2023-01-28 NOTE — Progress Notes (Addendum)
[facility] NEPHROLOGY PROGRESS NOTE  Assessment/ Plan: Pt is a 72 y.o. yo female with past medical history significant for HTN, DM, CAD, CHF, CKD stage V who was admitted on 01/21/2023 with anemia in the setting of stage 5 CKD.  # CKD stage V: Noted the patient has CKD  stage V at baseline and follows with Dr. Darylene Price at [facility].  The patient was seen by Dr. Kathrene Bongo during this hospitalization who thought that she is not a candidate for dialysis.  Apparently, 3 other physician from [facility] felt that the patient is not a candidate for HD because of nonadherence, poor insight accompanied by angry and agitated behavior.  Patient had also expressed to Dr. Marisue Humble previously that she did not desire dialysis. I agree she won't be able to do dialysis unless her behavior, agitation etc improves significantly.   The behavioral issue appears to be on display while in the hospital therefore she is currently on IVC. We are re-consulted on 6/3 because of elevated creatinine level. I agree with holding diuretics further.  I do not see any overt signs or symptoms of uremia and volume status looks acceptable.  Palliative care was already consulted and her care should really focus surrounding symptom management etc.  This is a very challenging and tough situation.   Goals of care discussion ongoing with her sister. I hopes she can increase oral intake and maintain good hydration. Pending labs from today.  # Hypertension/volume: Blood pressure and volume status looks acceptable.  Continue to hold diuretics.  Monitor BP.  # Anemia due to CKD: Received iron and ESA in the hospital.  Not sure if she will comply with outpatient ESA management.  # Mild hyperkalemia: Treating with Lokelma.  Recommend low potassium diet.  Addendum 4:57 PM: Chart reviewed again and discussed with the primary team and multiple team members.  Patient is now DNR and going home with hospice.  Nothing further to add from  nephrology perspective.  We will sign off, please call us back with question.  Subjective: Seen and examined at bedside.  She was frustrated that she cannot leave the hospital.  She denies nausea, vomiting, chest pain or shortness of breath.  Bed sitter was presented. Objective Vital signs in last 24 hours: Vitals:   01/27/23 0554 01/27/23 0926 01/27/23 1555 01/28/23 0737  BP: (!) 116/54 (!) 140/59 (!) 171/52 (!) 156/72  Pulse: 65 72  81  Resp: 18 18 18 20   Temp: 97.9 F (36.6 C) 98.1 F (36.7 C) 98.5 F (36.9 C) 98.3 F (36.8 C)  TempSrc: Oral   Oral  SpO2: 100% 100%  97%  Weight:      Height:       Weight change:   Intake/Output Summary (Last 24 hours) at 01/28/2023 1000 Last data filed at 01/27/2023 1727 Gross per 24 hour  Intake 708 ml  Output --  Net 708 ml        Labs: RENAL PANEL Recent Labs  Lab 01/21/23 1244 01/21/23 1543 01/22/23 0037 01/23/23 0035 01/25/23 0119 01/26/23 0118 01/27/23 1219  NA 142 141 141 142 140 136 140  K 5.3* 5.2* 4.3 4.6 5.2* 5.2* 4.6  CL 112* 110 111 109 104 99 104  CO2 21* 19* 21* 24 25 24  21*  GLUCOSE 164* 137* 162* 193* 128* 144* 162*  BUN 54* 55* 52* 57* 71* 85* 92*  CREATININE 5.41* 5.25* 5.36* 5.61* 6.39* 7.14* 7.65*  CALCIUM 8.5* 8.7* 8.4* 8.2* 7.8* 8.2* 8.8*  MG  --  1.8  --   --   --   --   --   ALBUMIN 2.8*  --   --   --   --   --  3.1*     Liver Function Tests: Recent Labs  Lab 01/21/23 1244 01/27/23 1219  AST 16 34  ALT 13 28  ALKPHOS 52 52  BILITOT 0.5 0.4  PROT 5.7* 6.4*  ALBUMIN 2.8* 3.1*    No results for input(s): "LIPASE", "AMYLASE" in the last 168 hours. Recent Labs  Lab 01/27/23 1530  AMMONIA 27   CBC: Recent Labs    04/18/22 0154 04/23/22 0023 11/06/22 1929 11/07/22 0446 11/08/22 0410 12/22/22 0000 12/22/22 0715 01/03/23 1151 01/21/23 1244 01/21/23 1543 01/21/23 1808 01/22/23 0037 01/23/23 0035 01/25/23 0119  HGB 8.1*   < >  --  9.8*   < >  --    < > 8.7* 7.1*  --   --  7.0*  7.3* 7.3*  MCV 91.3   < >  --  89.0   < >  --    < > 91.3 90.9  --   --  89.0 88.7 89.1  VITAMINB12 242  --   --  231  --  1,019*  --   --   --  471  --   --   --   --   FOLATE 17.0  --   --  13.0  --  16.9  --   --   --  18.0  --   --   --   --   FERRITIN 24  --  21  --   --  14  --   --   --  16  --   --   --   --   TIBC 295  --  300  --   --  322  --   --   --  361  --   --   --   --   IRON 47  --  33  --   --  57  --   --   --  42  --   --   --   --   RETICCTPCT  --   --   --  1.0  --  1.5  --   --   --   --  1.5  --   --   --    < > = values in this interval not displayed.     Cardiac Enzymes: No results for input(s): "CKTOTAL", "CKMB", "CKMBINDEX", "TROPONINI" in the last 168 hours. CBG: Recent Labs  Lab 01/27/23 0923 01/27/23 1213 01/27/23 1829 01/27/23 1957 01/28/23 0847  GLUCAP 168* 156* 214* 146* 131*     Iron Studies: No results for input(s): "IRON", "TIBC", "TRANSFERRIN", "FERRITIN" in the last 72 hours. Studies/Results: No results found.  Medications: Infusions:  valproate sodium      Scheduled Medications:  sodium chloride   Intravenous Once   amLODipine  10 mg Oral Daily   calcium carbonate  1 tablet Oral BID   carvedilol  12.5 mg Oral BID WC   docusate sodium  100 mg Oral BID   heparin  5,000 Units Subcutaneous Q8H   insulin aspart  0-9 Units Subcutaneous TID WC   isosorbide mononitrate  30 mg Oral Daily   LORazepam  2 mg Intramuscular Once   pantoprazole  20 mg Oral BID   polyethylene glycol  17 g Oral Daily   rosuvastatin  40 mg Oral Daily   senna  1 tablet Oral Daily   sodium zirconium cyclosilicate  10 g Oral Daily   thiamine  100 mg Oral Daily    have reviewed scheduled and prn medications.  Physical Exam: General:NAD, sitting on chair comfortable Heart:RRR, s1s2 nl Lungs:clear b/l, no crackle Abdomen:soft, Non-tender, non-distended Extremities: Trace lower extremities edema Neurology: Alert awake and tangential thought  Sady Monaco Prasad  Klein Willcox 01/28/2023,10:00 AM  LOS: 4 days

## 2023-01-28 NOTE — TOC Progression Note (Addendum)
Transition of Care [facility]) - Progression Note    Patient Details  Name: Teresa Poole MRN: [id number] Date of Birth: 02/27/1951  Transition [facility] Care [facility]) CM/SW Contact  Janae Bridgeman, RN Phone Number: 01/28/2023, 2:05 PM  Clinical Narrative:    CM called and spoke with the patient's sister, Bryce Fever by phone and she states that patient lives alone and drove herself to the hospital for care.  The sister states that she has been willing to assist patient in the past but states that she is unwilling to allow patient to come to her house for care since the patient "gets violent with her".  The sister states that the patient does not "trust psychiatrists and is not willing to seek care and take her medications".  The sister was agreeable to Home with Hospice care through [facility] but patient does not have capacity to make decisions and home alone is not a safe discharge plan for this patient.  The sister, [name] states that if hospital can deem the patient incompetent since patient does not have capacity then the sister assist with hiring a private agency to provide care to return home with home hospice.  I spoke with treatment team and updated on sister's request and necessity to likely  have family file for guardianship of the patient if necessary.  I called and spoke with Revonda Standard, CM with [facility] and Cordelia Pen, CM with [facility] plans to visit with the patient at the bedside regarding referral.  I asked that Roda Shutters call and speak with myself or  Joice Lofts, NP with Palliative Care Team. [facility] Team will continue to follow the patient for TOC needs.  01/28/23 CM and Roda Shutters, RNCM with [facility] met with the patient at the bedside and the patient was pleasant and able to discuss history and her desire to return home when she is able.  [facility] will continue to follow the patient for Home hospice services.  The patient was awake and alert and able to  answer questions and states that she plans to go home and continue to "live her life independently".  The patient continue to drive - patient states that she drives her car everywhere and travels everyday to return to the home by dark.  When I asked the patient would she be willing to let someone come into the home to care for her she declined and states that "it is too much money and I don't need it".    The patient was alert and oriented and is able to mobilize in the hospital room without assistance.  She is not short of breath and is on room air.  The patient states that she is able to cook for herself and continues to drive to obtain groceries, run errands and visit with family/friends.  No safe disposition has been determined at this time since the patient lives alone and does not have the capacity to make decisions for herself.  MSW to speak with the patient's sister regarding guardianship.  [facility] will continue to follow the patient.  01/28/2023 1604 - I called and spoke with Derl Barrow with [facility] and the home health agency may be able to follow the patient for medication compliance in the home.  [facility] will have a Home Health Psychiatry RN available for home health services - I will discuss this with Medical/Palliative and Psychiatry Team as safe discharge plan to return home with Home health services and Palliative Care through [facility] in the home.  The patient would need to be safe to discharge home alone with home health services from the Medical Team perspective.  Expected Discharge Plan: Home w Hospice Care Barriers to Discharge: Continued Medical Work up  Expected Discharge Plan and Services   Discharge Planning Services: CM Consult Post Acute Care Choice:  (To be determined) Living arrangements for the past 2 months: Apartment                                       Social Determinants of Health (SDOH) Interventions SDOH Screenings   Food Insecurity: No  Food Insecurity (01/21/2023)  Housing: Patient Declined (01/21/2023)  Transportation Needs: No Transportation Needs (01/21/2023)  Utilities: Not At Risk (01/21/2023)  Tobacco Use: Low Risk  (01/21/2023)    Readmission Risk Interventions    01/28/2023    1:56 PM 12/22/2022    2:40 PM 10/28/2021    2:56 PM  Readmission Risk Prevention Plan  Transportation Screening Complete Complete Complete  Medication Review (RN Care Manager) Complete Referral to Pharmacy Complete  PCP or Specialist appointment within 3-5 days of discharge Complete Complete Complete  HRI or Home Care Consult Complete Complete Complete  SW Recovery Care/Counseling Consult Complete Complete Complete  Palliative Care Screening Complete Not Applicable Not Applicable  Skilled Nursing Facility Not Applicable Not Applicable Not Applicable

## 2023-01-28 NOTE — Consult Note (Signed)
[facility] Face-to-Face Psychiatry Consult   Reason for Consult: Noncompliant and mildly agitated. Referring Physician: Hospitalist. Patient Identification: Teresa Poole MRN:  [id number] Principal Diagnosis: Acute exacerbation of CHF (congestive heart failure) (HCC) Diagnosis:  Principal Problem:   Acute exacerbation of CHF (congestive heart failure) (HCC) Active Problems:   Hypertension   Type 2 diabetes mellitus with chronic kidney disease, without long-term current use of insulin (HCC)   Acute renal failure superimposed on stage 4 chronic kidney disease (HCC)   Anemia   Delirium due to another medical condition   MCI (mild cognitive impairment)   Total Time spent with patient: 30 minutes  I personally spent 30 minutes on direct patient care. The direct patient care time included face-to-face time with the patient, reviewing the patient's chart, communicating with other professionals, and coordinating care. Greater than 50% of this time was spent in counseling or coordinating care with the patient regarding goals of hospitalization, medication, lab review, and discharge planning needs.   Subjective:   Teresa Poole is a 72 y.o. female patient admitted with  CKD 5 (I see at [facility]), hypertension, DM2, combined chronic systolic and diastolic heart failure, CAD, history of renal artery stenosis who presented to the hospital earlier today at the suggestion of PCP because of anemia.  Consult was for agitation.  HPI: Most of the information was primarily obtained from the records and patient interview.  The patient reports that she drove herself to the hospital because she was sick. Apparently she had shortness of breath and worsening edema.  She consented for treatment and was admitted to the medical floor.  Initially patient was noted to be fairly pleasant but records indicate that she has a history of noncompliance with medications.  She has not taken any of her home medications. Patient was  on Seroquel which was held initially but subsequently because of increasing agitation it was restarted.    On assessment today:   On today's reassessment patient did not present with any focal neurological deficit, she was able to speak clearly, follow commands, and denies weakness.   At this current time, patient's pre-existing condition that was substantially aggravated prior to this admission has returned to previous level.  Patient further states that her current health at this present time seems to be normal as she is noted to feel better and like her self again. " I feel great. I feel like the old [place].Im just ready to go." Patient declined hygiene care and or assistance with such, stating she will shower when she gets home. SHe is able to point to her patient belongings bag that is seated on the bedside table indicating she is ready to go. She does continue to lack insight and judgment, into the severity of kidney impairment and negative impacts on her mental health. Patient's chronic kidney disease, mild cognitive impairment, delusions of grandeur and somatic delusions significantly impact her ability to have capacity to make medical decisions.  Today she does present with improved mood, is pleasant and cordial. She does not use any vulgar or obscene language during this evaluation with the exception of her breakfast tray. She did not remember that she had declined her breakfast tray, as she did not like the items that were sent. Patient accused the Recruitment consultant of "lying. I do not refuse no damn food. " She does present with decrease in attention or concentration, although she is alert and oriented x 4, she shows some short-term memory deficit.  She does have  linear thought processes, and answers all questions appropriately.  At this time it is felt that patient has returned to psychiatric baseline, and no longer warrants ongoing psychiatric evaluation at this time. She denies  suicidal ideation,  homicidal ideation, and or auditory or visual hallucinations, and then states " If I did I wouldn't tell yall!" Followed by a grin. Further clarification was sought in which patient denied any active or recent suicidal ideations. While she may not have understanding she denies her denial of untreated CKD as a suicide intent.    Past Psychiatric History: This is primarily obtained from the records.  Patient appears to have a history of neurocognitive disorder and has been diagnosed with Alzheimer's dementia.  There is at least 1 incident in the records that patient has some behavioral consequences of dementia previously.  Risk to Self:  None noted Risk to Others:  Mild possibly related to agitation. Prior Inpatient Therapy:  None noted Prior Outpatient Therapy:  Seen by a neurologist before.  Past Medical History:  Past Medical History:  Diagnosis Date   Allergy    Anemia    Carotid stenosis    Chronic combined systolic and diastolic CHF (congestive heart failure) (HCC)    CKD (chronic kidney disease), stage IV (HCC) 09/24/2013   Pt at [facility], Dr. Marisue Humble   COVID-19 10/24/2021   Diabetes mellitus type 2 in nonobese [facility])    Edema 09/14/2013   GERD (gastroesophageal reflux disease)    History of ovarian cancer    Hyperlipidemia    Hypertension    Mild CAD    non-obstructive by LHC (09/25/2013): Proximal and mid LAD serial 20%, proximal circumflex 30%, mid AV groove circumflex 30%, mid RCA mild plaque.   NICM (nonischemic cardiomyopathy) (HCC)    Obesity (BMI 30-39.9)    RBBB    Renal artery stenosis (HCC) 10/11/2018   Thyroid disease    Seen by specialist    Past Surgical History:  Procedure Laterality Date   BIOPSY  12/22/2022   Procedure: BIOPSY;  Surgeon: Shellia Cleverly, DO;  Location: [facility] ENDOSCOPY;  Service: Gastroenterology;;   ESOPHAGOGASTRODUODENOSCOPY N/A 12/22/2022   Procedure: ESOPHAGOGASTRODUODENOSCOPY (EGD);  Surgeon: Shellia Cleverly, DO;  Location: [facility]  ENDOSCOPY;  Service: Gastroenterology;  Laterality: N/A;   LEFT HEART CATHETERIZATION WITH CORONARY ANGIOGRAM N/A 09/25/2013   Procedure: LEFT HEART CATHETERIZATION WITH CORONARY ANGIOGRAM;  Surgeon: Kathleene Hazel, MD;  Location: [facility] CATH LAB;  Service: Cardiovascular;  Laterality: N/A;   RENAL ANGIOGRAPHY N/A 10/06/2018   Procedure: RENAL ANGIOGRAPHY;  Surgeon: Cephus Shelling, MD;  Location: [facility] INVASIVE CV LAB;  Service: Cardiovascular;  Laterality: N/A;   Family History:  Family History  Problem Relation Age of Onset   Diabetes Father    Hypertension Father    Hypertension Maternal Grandfather    Family Psychiatric  History: Unknown at this time.  Patient apparently has a sister but she is unavailable for additional history. Social History:  Social History   Substance and Sexual Activity  Alcohol Use No     Social History   Substance and Sexual Activity  Drug Use No    Social History   Socioeconomic History   Marital status: Single    Spouse name: Not on file   Number of children: 0   Years of education: Not on file   Highest education level: Not on file  Occupational History    Employer: [employer]  Tobacco Use   Smoking status:  Never   Smokeless tobacco: Never  Vaping Use   Vaping Use: Never used  Substance and Sexual Activity   Alcohol use: No   Drug use: No   Sexual activity: Not on file  Other Topics Concern   Not on file  Social History Narrative   Works at [employer]   Patient lives at home alone.    Patient has no children.    Patient patient is right handed.    Patient is single.    Social Determinants of Health   Financial Resource Strain: Not on file  Food Insecurity: No Food Insecurity (01/21/2023)   Hunger Vital Sign    Worried About Running Out of Food in the Last Year: Never true    Ran Out of Food in the Last Year: Never true  Transportation Needs: No Transportation Needs (01/21/2023)   PRAPARE - Scientist, research (physical sciences) (Medical): No    Lack of Transportation (Non-Medical): No  Physical Activity: Not on file  Stress: Not on file  Social Connections: Not on file   Additional Social History:    Allergies:   Allergies  Allergen Reactions   Hydralazine Hcl Itching    ENTIRE BODY = burning sensation, also in the eyes (they have become very sensitive to light)   Atorvastatin Other (See Comments)    Per MD - pt not sure of reaction     Labs:  Results for orders placed or performed during the hospital encounter of 01/21/23 (from the past 48 hour(s))  Glucose, capillary     Status: Abnormal   Collection Time: 01/26/23  5:02 PM  Result Value Ref Range   Glucose-Capillary 198 (H) 70 - 99 mg/dL    Comment: Glucose reference range applies only to samples taken after fasting for at least 8 hours.  Glucose, capillary     Status: None   Collection Time: 01/26/23  9:50 PM  Result Value Ref Range   Glucose-Capillary 82 70 - 99 mg/dL    Comment: Glucose reference range applies only to samples taken after fasting for at least 8 hours.  Glucose, capillary     Status: None   Collection Time: 01/27/23  5:56 AM  Result Value Ref Range   Glucose-Capillary 90 70 - 99 mg/dL    Comment: Glucose reference range applies only to samples taken after fasting for at least 8 hours.  Glucose, capillary     Status: Abnormal   Collection Time: 01/27/23  9:23 AM  Result Value Ref Range   Glucose-Capillary 168 (H) 70 - 99 mg/dL    Comment: Glucose reference range applies only to samples taken after fasting for at least 8 hours.  Glucose, capillary     Status: Abnormal   Collection Time: 01/27/23 12:13 PM  Result Value Ref Range   Glucose-Capillary 156 (H) 70 - 99 mg/dL    Comment: Glucose reference range applies only to samples taken after fasting for at least 8 hours.  Comprehensive metabolic panel     Status: Abnormal   Collection Time: 01/27/23 12:19 PM  Result Value Ref Range   Sodium 140 135 - 145 mmol/L    Potassium 4.6 3.5 - 5.1 mmol/L   Chloride 104 98 - 111 mmol/L   CO2 21 (L) 22 - 32 mmol/L   Glucose, Bld 162 (H) 70 - 99 mg/dL    Comment: Glucose reference range applies only to samples taken after fasting for at least 8 hours.   BUN 92 (H)  8 - 23 mg/dL   Creatinine, Ser 1.61 (H) 0.44 - 1.00 mg/dL   Calcium 8.8 (L) 8.9 - 10.3 mg/dL   Total Protein 6.4 (L) 6.5 - 8.1 g/dL   Albumin 3.1 (L) 3.5 - 5.0 g/dL   AST 34 15 - 41 U/L   ALT 28 0 - 44 U/L   Alkaline Phosphatase 52 38 - 126 U/L   Total Bilirubin 0.4 0.3 - 1.2 mg/dL   GFR, Estimated 5 (L) >60 mL/min    Comment: (NOTE) Calculated using the CKD-EPI Creatinine Equation (2021)    Anion gap 15 5 - 15    Comment: Performed at [facility], [address], [place], [place] [address]  Ammonia     Status: None   Collection Time: 01/27/23  3:30 PM  Result Value Ref Range   Ammonia 27 9 - 35 umol/L    Comment: Performed at [facility], [address] [place], [place] [address]  Glucose, capillary     Status: Abnormal   Collection Time: 01/27/23  6:29 PM  Result Value Ref Range   Glucose-Capillary 214 (H) 70 - 99 mg/dL    Comment: Glucose reference range applies only to samples taken after fasting for at least 8 hours.  Glucose, capillary     Status: Abnormal   Collection Time: 01/27/23  7:57 PM  Result Value Ref Range   Glucose-Capillary 146 (H) 70 - 99 mg/dL    Comment: Glucose reference range applies only to samples taken after fasting for at least 8 hours.  Glucose, capillary     Status: Abnormal   Collection Time: 01/28/23  8:47 AM  Result Value Ref Range   Glucose-Capillary 131 (H) 70 - 99 mg/dL    Comment: Glucose reference range applies only to samples taken after fasting for at least 8 hours.  Basic metabolic panel     Status: Abnormal   Collection Time: 01/28/23 11:07 AM  Result Value Ref Range   Sodium 142 135 - 145 mmol/L   Potassium 4.4 3.5 - 5.1 mmol/L   Chloride 104 98 - 111 mmol/L   CO2 23 22 - 32  mmol/L   Glucose, Bld 193 (H) 70 - 99 mg/dL    Comment: Glucose reference range applies only to samples taken after fasting for at least 8 hours.   BUN 85 (H) 8 - 23 mg/dL   Creatinine, Ser 1.19 (H) 0.44 - 1.00 mg/dL   Calcium 9.2 8.9 - 14.7 mg/dL   GFR, Estimated 5 (L) >60 mL/min    Comment: (NOTE) Calculated using the CKD-EPI Creatinine Equation (2021)    Anion gap 15 5 - 15    Comment: Performed at [facility], [address] [place], [place] [address]  CBC     Status: Abnormal   Collection Time: 01/28/23 11:07 AM  Result Value Ref Range   WBC 6.0 4.0 - 10.5 K/uL   RBC 3.36 (L) 3.87 - 5.11 MIL/uL   Hemoglobin 9.4 (L) 12.0 - 15.0 g/dL   HCT 21.3 (L) 08.6 - 57.8 %   MCV 89.0 80.0 - 100.0 fL   MCH 28.0 26.0 - 34.0 pg   MCHC 31.4 30.0 - 36.0 g/dL   RDW 46.9 62.9 - 52.8 %   Platelets 314 150 - 400 K/uL   nRBC 0.0 0.0 - 0.2 %    Comment: Performed at [facility], [address] [place], [place] [address]  Glucose, capillary  Status: Abnormal   Collection Time: 01/28/23 12:22 PM  Result Value Ref Range   Glucose-Capillary 158 (H) 70 - 99 mg/dL    Comment: Glucose reference range applies only to samples taken after fasting for at least 8 hours.    Current Facility-Administered Medications  Medication Dose Route Frequency Provider Last Rate Last Admin   0.9 %  sodium chloride infusion (Manually program via Guardrails IV Fluids)   Intravenous Once Burnadette Pop, MD       amLODipine (NORVASC) tablet 10 mg  10 mg Oral Daily Burnadette Pop, MD   10 mg at 01/28/23 0945   calcium carbonate (TUMS - dosed in mg elemental calcium) chewable tablet 200 mg of elemental calcium  1 tablet Oral BID Burnadette Pop, MD   200 mg of elemental calcium at 01/28/23 0946   carvedilol (COREG) tablet 12.5 mg  12.5 mg Oral BID WC Adhikari, Amrit, MD   12.5 mg at 01/28/23 0946   docusate sodium (COLACE) capsule 100 mg  100 mg Oral BID Burnadette Pop, MD   100 mg at 01/28/23 0946    haloperidol (HALDOL) tablet 1 mg  1 mg Oral Q3H PRN Maryagnes Amos, FNP   1 mg at 01/28/23 1259   Or   haloperidol lactate (HALDOL) injection 1 mg  1 mg Intramuscular Q3H PRN Maryagnes Amos, FNP       haloperidol lactate (HALDOL) injection 3 mg  3 mg Intramuscular Q6H PRN Burnadette Pop, MD   3 mg at 01/27/23 1302   heparin injection 5,000 Units  5,000 Units Subcutaneous Q8H Adhikari, Amrit, MD   5,000 Units at 01/27/23 0556   insulin aspart (novoLOG) injection 0-9 Units  0-9 Units Subcutaneous TID WC Burnadette Pop, MD   3 Units at 01/27/23 1838   isosorbide mononitrate (IMDUR) 24 hr tablet 30 mg  30 mg Oral Daily Adhikari, Amrit, MD   30 mg at 01/28/23 0946   LORazepam (ATIVAN) injection 2 mg  2 mg Intramuscular Once Crosley, Debby, MD       Oral care mouth rinse  15 mL Mouth Rinse PRN Adhikari, Amrit, MD       pantoprazole (PROTONIX) EC tablet 20 mg  20 mg Oral BID Burnadette Pop, MD   20 mg at 01/28/23 0946   polyethylene glycol (MIRALAX / GLYCOLAX) packet 17 g  17 g Oral Daily Burnadette Pop, MD   17 g at 01/28/23 0946   rosuvastatin (CRESTOR) tablet 40 mg  40 mg Oral Daily Burnadette Pop, MD   40 mg at 01/28/23 0946   senna (SENOKOT) tablet 8.6 mg  1 tablet Oral Daily Burnadette Pop, MD   8.6 mg at 01/28/23 0946   sodium zirconium cyclosilicate (LOKELMA) packet 10 g  10 g Oral Daily Maxie Barb, MD   10 g at 01/28/23 0946   thiamine (VITAMIN B1) tablet 100 mg  100 mg Oral Daily Burnadette Pop, MD   100 mg at 01/28/23 0946   valproate (DEPACON) 250 mg in dextrose 5 % 50 mL IVPB  250 mg Intravenous Q12H Maryagnes Amos, FNP        Musculoskeletal: Strength & Muscle Tone:  Not tested patient was lying in bed. Gait & Station:  Not tested patient was lying in bed. Patient leans:  Not tested.            Psychiatric Specialty Exam:  Presentation  General Appearance:  Appropriate for Environment; Casual  Eye Contact: Fair  Speech: Clear  and  Coherent; Normal Rate  Speech Volume: Normal  Handedness: Right   Mood and Affect  Mood: Anxious; Labile  Affect: Appropriate; Congruent   Thought Process  Thought Processes: Coherent; Linear  Descriptions of Associations:Intact  Orientation:Full (Time, Place and Person)  Thought Content:Logical  History of Schizophrenia/Schizoaffective disorder:No data recorded Duration of Psychotic Symptoms:No data recorded Hallucinations:Hallucinations: None  Ideas of Reference:None  Suicidal Thoughts:Suicidal Thoughts: No  Homicidal Thoughts:Homicidal Thoughts: No   Sensorium  Memory: Immediate Poor; Recent Poor; Remote Poor  Judgment: Poor  Insight: Poor   Executive Functions  Concentration: Poor  Attention Span: Fair  Recall: Poor  Fund of Knowledge: Fair  Language: Fair   Psychomotor Activity  Psychomotor Activity: Psychomotor Activity: -- (psychomotor agitation)   Assets  Assets: Manufacturing systems engineer; Social Support; Resilience; Physical Health   Sleep  Sleep: Sleep: Fair   Physical Exam: Physical Exam Vitals and nursing note reviewed.  Constitutional:      Appearance: Normal appearance. She is normal weight.  Skin:    Capillary Refill: Capillary refill takes less than 2 seconds.  Neurological:     General: No focal deficit present.     Mental Status: She is alert. Mental status is at baseline. She is disoriented.  Psychiatric:        Attention and Perception: Perception normal. She is inattentive.        Mood and Affect: Mood is anxious. Affect is labile.        Speech: Speech is rapid and pressured.        Behavior: Behavior is agitated.        Thought Content: Thought content normal.        Cognition and Memory: Cognition is impaired. Memory is impaired. She exhibits impaired recent memory and impaired remote memory.        Judgment: Judgment is impulsive.    Review of Systems  Psychiatric/Behavioral: Negative.   Negative for hallucinations.   All other systems reviewed and are negative.  Blood pressure (!) 156/72, pulse 81, temperature 98.3 F (36.8 C), temperature source Oral, resp. rate 20, height 5\' 7"  (1.702 m), weight 77.6 kg, SpO2 97 %. Body mass index is 26.79 kg/m.  Treatment Plan Summary: Daily contact with patient to assess and evaluate symptoms and progress in treatment and Medication management   Impression: Mild delirium due to general medical condition with agitation.  Mild to moderate neurocognitive disorder  Recommendations: -Continue with stabilization of her medical condition including optimization of former metabolic syndrome. -Continue delirium precautions -Patient lacks capacity at this time, considering reaching out to next of kin.  -DC  Seroquel at this time, risk for QTc prolongation and otherwise has been ineffective over 6 day course of admission.  -Will dc order for Depakote, patient seems to respond to Haldol at this time. Patient continues to deny IV access. If patient continues to remain in the hospital with agitation and aggression, consider adding Depakote Sprinkles 125mg  TID.  - Haldol 1mg  IV/PO q3 hours.  -Consider PMT consult.  -Considering her labs, inadequate candidate for dialysis, lacking capacity, agitation and MCI that may lead to increase in psychotropic medication administration, consider Ethics/RM consult to ensure we are providing the best level of care for this patient who can potentially develop cardiac event or complications from the above risks.  -Due to worsening cognitive impairment, underlying psychiatric condition, increase in mood swings/lability, and concerns of family members --> will complete medical review form for [facility] as patient is a danger to self  and others if she continues to drive.  Will continue to improve modifiable risk factors for safety of self and others.  This was discussed next-of-kin Jac Canavan who is in complete  agreement.  Primary team was also notified.   Last EKG obtained 06/05, QTc 503 using Federica model. Patient with multiple risk factors for QTc prolongation to include cardiac abnormalities,CKD Stage V, female, age >55, 2 Psychotropics that may prolong qtc, and electrolyte abnormalities. Once QTc improves consider starting zyprexa 2.5mg  po qhs.   Labs obtained and reviewed include increasing creatinine 7.65, BUN (92), albumin (3.1). Ammonia and liver transaminase are WNL.   Disposition: Psychiatry will sign off at this time.   Maryagnes Amos, FNP 01/28/2023 4:37 PM

## 2023-01-28 NOTE — Progress Notes (Addendum)
Daily Progress Note   Patient Name: Teresa Poole       Date: 01/28/2023 DOB: Jun 07, 1951  Age: 72 y.o. MRN#: [id number] Attending Physician: Burnadette Pop, MD Primary Care Physician: Malka So., MD Admit Date: 01/21/2023  Reason for Consultation/Follow-up: Establishing goals of care  Subjective: I have reviewed medical records including EPIC notes, MAR, and labs. Unable to receive report from primary RN. Discussed case in detail with Dr. Renford Dills, Nonnie Done FNP/psych, and TOC. Per Dr. Renford Dills, after follow up with HCPOA/sister this morning, family are agreeable to DNR/DNI and for patient's discharge home with hospice.  Detailed discussion had with TOC, psych NP, and attending regarding recommended next steps with consideration sister has previously stated patient cannot live with her due to aggression and agitation. Patient is likely also not safe to return to live on her own, could consider private duty caregivers pending finances. Reviewed that hospice could provide better management of patient's aggression that would allow an easier time for family to care for her.   Went to visit patient at bedside - no family/visitors present. Sitter was present. Patient is dressed and ambulating around her room - she is agitated and states "I want to get out of here! Get me out of here." She seems unable to make complex medical decisions for herself through our brief interaction. Per psych, she does not have capacity to make decisions. Primary NT states patient did not eat breakfast.  12:10 PM Attempted to call HCPOA/sister to continue and offer support and discuss options at discharge - no answer - confidential voicemail left and PMT phone number provided with request to return  call.  Continued discharge planning discussions with psych, TOC, and attending. Rehab is not recommended - patient has had 12 ED visits and 3 inpatient admissions in the last 6 months, she is at high risk of readmission without hospice. Patient is also likely not safe to return home alone. Per TOC, family are open to referral to [facility] hosp[facility]. Sent message to [facility] hospice liaison and again discussed case in detail. [facility] liaison will assess patient and speak with family.   Length of Stay: 4  Current Medications: Scheduled Meds:   sodium chloride   Intravenous Once   amLODipine  10 mg Oral Daily   calcium carbonate  1 tablet Oral BID  carvedilol  12.5 mg Oral BID WC   docusate sodium  100 mg Oral BID   heparin  5,000 Units Subcutaneous Q8H   insulin aspart  0-9 Units Subcutaneous TID WC   isosorbide mononitrate  30 mg Oral Daily   LORazepam  2 mg Intramuscular Once   pantoprazole  20 mg Oral BID   polyethylene glycol  17 g Oral Daily   rosuvastatin  40 mg Oral Daily   senna  1 tablet Oral Daily   sodium zirconium cyclosilicate  10 g Oral Daily   thiamine  100 mg Oral Daily    Continuous Infusions:  valproate sodium      PRN Meds: haloperidol **OR** haloperidol lactate, haloperidol lactate, mouth rinse  Physical Exam Vitals and nursing note reviewed.  Constitutional:      General: She is not in acute distress. Pulmonary:     Effort: No respiratory distress.  Skin:    General: Skin is warm and dry.  Neurological:     Mental Status: She is alert.  Psychiatric:        Behavior: Behavior is agitated.        Cognition and Memory: Cognition is impaired.        Judgment: Judgment is impulsive.             Vital Signs: BP (!) 156/72 (BP Location: Left Arm)   Pulse 81   Temp 98.3 F (36.8 C) (Oral)   Resp 20   Ht 5\' 7"  (1.702 m)   Wt 77.6 kg   SpO2 97%   BMI 26.79 kg/m  SpO2: SpO2: 97 % O2 Device: O2 Device: Room Air O2 Flow Rate:    Intake/output summary:   Intake/Output Summary (Last 24 hours) at 01/28/2023 1159 Last data filed at 01/28/2023 0950 Gross per 24 hour  Intake 948 ml  Output --  Net 948 ml   LBM: Last BM Date : 01/25/23 Baseline Weight: Weight: 72 kg Most recent weight: Weight: 77.6 kg       Palliative Assessment/Data: PPS 70%      Patient Active Problem List   Diagnosis Date Noted   MCI (mild cognitive impairment) 01/27/2023   Delirium due to another medical condition 01/22/2023   Acute exacerbation of CHF (congestive heart failure) (HCC) 01/21/2023   Acute on chronic anemia 12/22/2022   Melena 12/21/2022   QT prolongation 12/21/2022   Elevated troponin I level 11/10/2022   Hypertensive emergency 11/09/2022   Demand ischemia 11/09/2022   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) 11/07/2022   Acute kidney injury (HCC) 11/05/2022   Dizziness 04/17/2022   (HFpEF) heart failure with preserved ejection fraction (HCC) 02/28/2022   Hypomagnesemia 02/28/2022   Manic episode, unspecified (HCC)    COVID-19 virus infection 10/25/2021   UTI (urinary tract infection) 10/25/2021   Anemia 10/25/2021   Suicidal ideations 10/17/2021   Acute renal failure superimposed on stage 4 chronic kidney disease (HCC) 10/14/2021   ARF (acute renal failure) (HCC) 10/14/2021   Small bowel obstruction (HCC)    SBO (small bowel obstruction) (HCC) 09/22/2021   Adjustment disorder with anxiety 06/10/2021   Atypical chest pain 11/27/2018   Renal artery stenosis (HCC) 10/11/2018   Hyperlipidemia associated with type 2 diabetes mellitus (HCC) 10/26/2013   Chronic systolic heart failure (HCC) 09/28/2013   CKD (chronic kidney disease), stage IV (HCC) 09/24/2013   Hypertensive urgency 09/18/2013   Cardiomyopathy, hypertensive (HCC) 09/18/2013   Edema 09/14/2013   Acute  systolic heart failure (HCC) 09/09/2013   Hypertensive heart disease with CHF (congestive heart failure) (HCC) 09/09/2013    Hypertension 09/07/2013   Type 2 diabetes mellitus with chronic kidney disease, without long-term current use of insulin (HCC) 09/07/2013   Obesity (BMI 30-39.9)    History of ovarian cancer     Palliative Care Assessment & Plan   Patient Profile: 72 y.o. female  with past medical history of combined systolic/diastolic CHF, CKD stage IV, hypertension, diabetes type 2, coronary artery disease, obesity, renal artery stenosis admitted on 01/21/2023 with abnormal labs at PCP visit.    Patient was found to have a hemoglobin of 7 at PCPs office.  Upon further evaluation in the ED, patient was admitted for acute exacerbation of CHF and acute kidney injury on CKD stage V. PMT has been consulted to assist with goals of care conversation.  Assessment: Principal Problem:   Acute exacerbation of CHF (congestive heart failure) (HCC) Active Problems:   Hypertension   Type 2 diabetes mellitus with chronic kidney disease, without long-term current use of insulin (HCC)   Acute renal failure superimposed on stage 4 chronic kidney disease (HCC)   Anemia   Delirium due to another medical condition   MCI (mild cognitive impairment)   Recommendations/Plan: Unable to speak with patient's sister/HCPOA today. Continue to treat the treatable per previous discussions Now DNR/DNI - durable DNR form completed and placed in shadow chart. Copy was made and will be scanned into Vynca/ACP tab Goal is for patient's discharge home with hospice; however, family express inability to care for patient in their home due to her agitation and aggression. Patient is likely not safe to return home alone and does not have payor for LTC placement or private duty caregivers. Appreciate TOC and hospice liaison assistance in navigating a safe discharge plan Hospice has the ability to better manage patient's agitation and aggression, which would allow an easier time for family to provide care PMT will continue to follow and support  holistically  Goals of Care and Additional Recommendations: Limitations on Scope of Treatment: Full Scope Treatment and No Tracheostomy  Code Status:    Code Status Orders  (From admission, onward)           Start     Ordered   01/28/23 1136  Do not attempt resuscitation (DNR)  Continuous       Question Answer Comment  If patient has no pulse and is not breathing Do Not Attempt Resuscitation   If patient has a pulse and/or is breathing: Medical Treatment Goals MEDICAL INTERVENTIONS DESIRED: Use advanced airway interventions, mechanical ventilation or cardioversion in appropriate circumstances; Use medication/IV fluids as indicated; Provide comfort medications; Transfer to Progressive/Stepdown/ICU as indicated.   Consent: Discussion documented in EHR or advanced directives reviewed      01/28/23 1135           Code Status History     Date Active Date Inactive Code Status Order ID Comments User Context   01/21/2023 1719 01/28/2023 1135 Full Code [id number]  Burnadette Pop, MD ED   12/21/2022 2334 12/22/2022 2051 Full Code [id number]  Therisa Doyne, MD ED   11/05/2022 0844 11/13/2022 2314 Full Code [id number]  Maryln Gottron, MD ED   04/17/2022 2318 04/18/2022 1800 Full Code [id number]  Marolyn Haller, MD ED   02/28/2022 1959 03/01/2022 1736 Full Code [id number]  Charlsie Quest, MD ED   10/25/2021 1911 10/28/2021 2006 Full Code [id number]  Orland Mustard, MD Inpatient  10/17/2021 0040 10/17/2021 1943 Full Code [id number]  Theron Arista, PA-C ED   10/14/2021 2158 10/15/2021 1332 DNR [id number]  Eduard Clos, MD Inpatient   06/10/2021 1155 06/11/2021 2111 DNR [id number]  Jonah Blue, MD Inpatient   06/10/2021 1147 06/10/2021 1154 Full Code [id number]  Jonah Blue, MD Inpatient   11/27/2018 0919 11/28/2018 1426 Full Code [id number]  Dorcas Carrow, MD ED   10/04/2018 1816 10/07/2018 2123 Full Code [id number]  Lahoma Crocker, MD ED   09/18/2013 1725 09/26/2013 1707 Full Code [id number]   Kathleene Hazel, MD ED   09/07/2013 1837 09/10/2013 1412 Full Code [id number]  Dhungel, Theda Belfast, MD Inpatient       Prognosis:  < 6 months  Discharge Planning: Home with Hospice  Care plan was discussed with primary NT, TOC, Dr. Renford Dills, Psych NP, hospice liaison  Thank you for allowing the Palliative Medicine Team to assist in the care of this patient.   Total Time 52 minutes Prolonged Time Billed  no       Greater than 50%  of this time was spent counseling and coordinating care related to the above assessment and plan.  Haskel Khan, NP  Please contact Palliative Medicine Team phone at [phone] for questions and concerns.   *Portions of this note are a verbal dictation therefore any spelling and/or grammatical errors are due to the "Dragon Medical One" system interpretation.

## 2023-01-28 NOTE — Progress Notes (Signed)
Patient very agitated, restless, and uncooperative. She has refused her scheduled medications and vital sign checks saying, "you ain't doing shit for me". She wants to go home and wants to rest. She did agree to take prn Haldol. Sitter at bedside.

## 2023-01-28 NOTE — Progress Notes (Signed)
Civil engineer, contracting [facility]) Hospital Liaison Note:  Referral received for hospice services at home once patient is medically stable for safe discharge.    Met with patient and TOC at bedside who was pleasant and sitting up in the recliner in NAD. Patient expressed wishes to go home and live independently and does not feel like she needs any support in the home at this time. Although patient seemed Alert and oriented (answering questions appropriately at this time), she did not seem to follow our conversation well or seemed to understand the purpose of our visit, so reached out to patient's sister to explain services and offer support. Family was agreeable to [facility] Palliative or Hospice services in the home or in a facility..    Per TOC, Disposition plan is unknown at this time - [facility] will continue to follow this patient until a safe discharge plan is in place. Currently, patient has no DME needs and DME in the home.   Upon discharge - Please send completed DNR and arrange for any comfort scripts that may be needed for symptom management so there is no lapse in patient comfort prior to hospice services beginning.  Please reach out with any Palliative or Hospice related needs. And thank you for allowing [place] the opportunity to participate in this patient's care.   Roda Shutters, RN [facility] Liaison (in [place]) [phone]

## 2023-01-29 ENCOUNTER — Telehealth (HOSPITAL_COMMUNITY): Payer: Self-pay | Admitting: Pharmacy Technician

## 2023-01-29 ENCOUNTER — Other Ambulatory Visit (HOSPITAL_COMMUNITY): Payer: Self-pay

## 2023-01-29 DIAGNOSIS — Z515 Encounter for palliative care: Secondary | ICD-10-CM | POA: Diagnosis not present

## 2023-01-29 DIAGNOSIS — Z66 Do not resuscitate: Secondary | ICD-10-CM | POA: Diagnosis not present

## 2023-01-29 DIAGNOSIS — I509 Heart failure, unspecified: Secondary | ICD-10-CM | POA: Diagnosis not present

## 2023-01-29 DIAGNOSIS — I5043 Acute on chronic combined systolic (congestive) and diastolic (congestive) heart failure: Secondary | ICD-10-CM | POA: Diagnosis not present

## 2023-01-29 LAB — CBC
HCT: 30.3 % — ABNORMAL LOW (ref 36.0–46.0)
Hemoglobin: 9.7 g/dL — ABNORMAL LOW (ref 12.0–15.0)
MCH: 28.9 pg (ref 26.0–34.0)
MCHC: 32 g/dL (ref 30.0–36.0)
MCV: 90.2 fL (ref 80.0–100.0)
Platelets: 306 10*3/uL (ref 150–400)
RBC: 3.36 MIL/uL — ABNORMAL LOW (ref 3.87–5.11)
RDW: 15 % (ref 11.5–15.5)
WBC: 6.3 10*3/uL (ref 4.0–10.5)
nRBC: 0 % (ref 0.0–0.2)

## 2023-01-29 LAB — GLUCOSE, CAPILLARY: Glucose-Capillary: 116 mg/dL — ABNORMAL HIGH (ref 70–99)

## 2023-01-29 LAB — BASIC METABOLIC PANEL
Anion gap: 14 (ref 5–15)
BUN: 86 mg/dL — ABNORMAL HIGH (ref 8–23)
CO2: 22 mmol/L (ref 22–32)
Calcium: 9.1 mg/dL (ref 8.9–10.3)
Chloride: 101 mmol/L (ref 98–111)
Creatinine, Ser: 7 mg/dL — ABNORMAL HIGH (ref 0.44–1.00)
GFR, Estimated: 6 mL/min — ABNORMAL LOW (ref >60)
Glucose, Bld: 167 mg/dL — ABNORMAL HIGH (ref 70–99)
Potassium: 4.4 mmol/L (ref 3.5–5.1)
Sodium: 137 mmol/L (ref 135–145)

## 2023-01-29 MED ORDER — SENNA 8.6 MG PO TABS
1.0000 | ORAL_TABLET | Freq: Every day | ORAL | 0 refills | Status: DC
  Filled 2023-01-29: qty 100, 100d supply, fill #0

## 2023-01-29 MED ORDER — ISOSORBIDE MONONITRATE ER 30 MG PO TB24
30.0000 mg | ORAL_TABLET | Freq: Every day | ORAL | 0 refills | Status: DC
  Filled 2023-01-29: qty 60, 60d supply, fill #0

## 2023-01-29 MED ORDER — CARVEDILOL 12.5 MG PO TABS
12.5000 mg | ORAL_TABLET | Freq: Two times a day (BID) | ORAL | 0 refills | Status: DC
  Filled 2023-01-29: qty 60, 30d supply, fill #0

## 2023-01-29 MED ORDER — HALOPERIDOL 1 MG PO TABS
1.0000 mg | ORAL_TABLET | ORAL | 0 refills | Status: DC | PRN
  Filled 2023-01-29: qty 60, 8d supply, fill #0

## 2023-01-29 MED ORDER — FERROUS SULFATE 325 (65 FE) MG PO TABS
325.0000 mg | ORAL_TABLET | Freq: Every day | ORAL | 1 refills | Status: DC
  Filled 2023-01-29: qty 100, 100d supply, fill #0

## 2023-01-29 MED ORDER — SODIUM ZIRCONIUM CYCLOSILICATE 10 G PO PACK
10.0000 g | PACK | Freq: Every day | ORAL | 0 refills | Status: DC
  Filled 2023-01-29: qty 30, 30d supply, fill #0

## 2023-01-29 MED ORDER — POLYETHYLENE GLYCOL 3350 17 GM/SCOOP PO POWD
17.0000 g | Freq: Every day | ORAL | 0 refills | Status: DC
  Filled 2023-01-29: qty 238, 14d supply, fill #0

## 2023-01-29 NOTE — Plan of Care (Signed)
Patient is sitting in chair with sitter at bedside. Patient has refused all vitals, medicine and sugar checks. Patient demands that she not be touched. She states that she is "imprisoned" and "wants to go home". Positive teaching, reinforcement and supportive listening was attempted but patient became more agitated. She threatened to leave. Patient accepted to take haldol po 1 mg, but no significant change has been observed. Patient does ok when not stimulated. MD was notified of behavior. Plan of care is ongoing.  Problem: Education: Goal: Ability to describe self-care measures that may prevent or decrease complications (Diabetes Survival Skills Education) will improve Outcome: Progressing Goal: Individualized Educational Video(s) Outcome: Progressing   Problem: Coping: Goal: Ability to adjust to condition or change in health will improve Outcome: Progressing   Problem: Fluid Volume: Goal: Ability to maintain a balanced intake and output will improve Outcome: Progressing   Problem: Health Behavior/Discharge Planning: Goal: Ability to identify and utilize available resources and services will improve Outcome: Progressing Goal: Ability to manage health-related needs will improve Outcome: Progressing   Problem: Metabolic: Goal: Ability to maintain appropriate glucose levels will improve Outcome: Progressing   Problem: Nutritional: Goal: Maintenance of adequate nutrition will improve Outcome: Progressing Goal: Progress toward achieving an optimal weight will improve Outcome: Progressing   Problem: Skin Integrity: Goal: Risk for impaired skin integrity will decrease Outcome: Progressing   Problem: Tissue Perfusion: Goal: Adequacy of tissue perfusion will improve Outcome: Progressing   Problem: Education: Goal: Knowledge of General Education information will improve Description: Including pain rating scale, medication(s)/side effects and non-pharmacologic comfort  measures Outcome: Progressing   Problem: Health Behavior/Discharge Planning: Goal: Ability to manage health-related needs will improve Outcome: Progressing   Problem: Clinical Measurements: Goal: Ability to maintain clinical measurements within normal limits will improve Outcome: Progressing Goal: Will remain free from infection Outcome: Progressing Goal: Diagnostic test results will improve Outcome: Progressing Goal: Respiratory complications will improve Outcome: Progressing Goal: Cardiovascular complication will be avoided Outcome: Progressing   Problem: Activity: Goal: Risk for activity intolerance will decrease Outcome: Progressing   Problem: Nutrition: Goal: Adequate nutrition will be maintained Outcome: Progressing   Problem: Coping: Goal: Level of anxiety will decrease Outcome: Progressing   Problem: Elimination: Goal: Will not experience complications related to bowel motility Outcome: Progressing Goal: Will not experience complications related to urinary retention Outcome: Progressing   Problem: Pain Managment: Goal: General experience of comfort will improve Outcome: Progressing   Problem: Safety: Goal: Ability to remain free from injury will improve Outcome: Progressing   Problem: Skin Integrity: Goal: Risk for impaired skin integrity will decrease Outcome: Progressing   Problem: Safety: Goal: Violent Restraint(s) Outcome: Progressing

## 2023-01-29 NOTE — Telephone Encounter (Signed)
Patient Advocate Encounter  Received notification that the request for prior authorization for Lokelma 10GM packets  has been denied due to The unmet criteria are: you have been unable to control hyperkalemia with all of the following interventions if applicable: discontinuation of NSAID therapy, dose decrease or discontinuation of ACE Inhibitor (ACEI), angiotensin receptor blocker (ARB) or aldosterone antagonist therapy if clinically appropriate, and initiation or adjustment of loop diuretic therapy (for example: furosemide, bumetanide, and torsemide) if clinically appropriate AND you have tried or cannot use Veltassa. Roland Earl, CPhT Pharmacy Patient Advocate Specialist [facility] Patient Advocate Team Direct Number: [phone]  Fax: [phone]

## 2023-01-29 NOTE — Telephone Encounter (Signed)
Patient Advocate Encounter   Received notification that prior authorization for Haloperidol 1MG  tablets is required.   PA submitted on 01/29/2023 [employer] Electronic PA Form Status is pending       Roland Earl, CPhT Pharmacy Patient Advocate Specialist [facility] Patient Advocate Team Direct Number: [phone]  Fax: [phone]

## 2023-01-29 NOTE — Discharge Summary (Signed)
Physician Discharge Summary  Teresa Poole:[id number] DOB: [birth year] DOA: 01/21/2023  PCP: Malka So., MD  Admit date: 01/21/2023 Discharge date: 01/29/2023  Admitted From: Home Disposition:  Home  Discharge Condition:Stable CODE STATUS:FULL Diet recommendation: Renal diet  Brief/Interim Summary: Teresa Poole is a 72 y.o. female with medical history significant of combined systolic/diastolic CHF, CKD stage IV, hypertension, diabetes type 2, coronary artery  disease, obesity, renal artery stenosis who presented to the emergency department as per suggestion of her PCP for the evaluation low hemoglobin. Patient also mentioned that she has been short of breath and tired for last 1 month.  Patient has been noticing lower extremity edema more than usual.  On presentation, she was found to be volume overloaded and worsened kidney function.  Nephrology consulted.  Started on IV Lasix but now d/ced due to worsening kidney function.  Patient  has cognitive impairment/dementia  and frequently wants to leave, does not understand her medical problems so psychiatry consulted for determination of capacity.  She was found not to have capacity.   After extension discussion with family, CODE STATUS changed to DNR.  Sister agreeable for hospice .  Family agreeable to take her home today followed by hospice services at home.  Following problems were addressed during the hospitalization: AKI in CKD stage V/hyperkalemia: Baseline creatinine usually  in the range of 4.  Patient presented with creatinine in the range of 5.  She follows with nephrology, Dr. Marisue Humble.  Nephrology consulted . She  also has history of renal artery stenosis. As per nephrology,she is not a candidate for dialysis because of her cognitive issues/possible lack of capacity, noncompliance.  Initially started on IV diuretics.  Kidney function worsened , creatinine trended up , developed hyperkalemia so diuretics held.   Nephrology were  following,now signed off.  Palliative care were following.  Continue Lokelma   Acute on chronic combined systolic/diastolic CHF: Appeared volume overloaded on presentation with bilateral lower extremity swelling.  Chest x-ray showed features of pulmonary edema, trace bilateral pleural effusion.  This is mostly secondary to cardiorenal syndrome due to her worsening kidney function.elevated BNP.  Patient does not take any diuretics at home.  She is noncompliant.Started on IV diuresis,now on hold due to worsening kidney function.Last echo has shown EF of 45 to 50% grade 2 diastolic dysfunction.   Normocytic anemia: Hemoglobin was found to be in the range of 7.  Baseline hemoglobin is from 8-9.  No evidence of acute blood loss.  No report of hematochezia or melena.  This is most likely secondary to her progressive CKD.  Patient was admitted for the same in April.  At that time GI was consulted and she underwent EGD with finding of Barrett's esophagus, no active bleeding, recommended to continue Protonix 20 mg twice daily at that time. Given IV iron.  Continue oral iron supplementation on discharge   History of coronary artery disease: Found to have nonobstructive coronary disease as per cardiac cath on 2015   Diabetes type 2: Takes Ozempic at home.  Continue sliding scale insulin here.  Monitor blood sugars   Hypertension: Presented with hypertension.  Takes Imdur, lisinopril, amlodipine, Coreg at home.  Held lisinopril due to AKI.   History of memory impairment/adjustment disorder with anxiety/Dementia: As per sister, she is noncompliant and she does not take any medications at home most of the time.  Patient does not have insight of her medical problems.  We requested psychiatry assistance.  Psych currently determined that she  does not have capacity.  Family will take care of her at home and she will be followed by hospice services. Continue supportive care for her dementia    History of OSA: Not on  CPAP   Hyperlipidemia: She was on statin   Goals of care/disposition: After discussion with sister and her family, CODE STATUS changed to DNR.  Sister and family are agreeable for hospice.       Discharge Diagnoses:  Principal Problem:   Acute exacerbation of CHF (congestive heart failure) (HCC) Active Problems:   Acute renal failure superimposed on stage 4 chronic kidney disease (HCC)   Hypertension   Type 2 diabetes mellitus with chronic kidney disease, without long-term current use of insulin (HCC)   Anemia   Delirium due to another medical condition   MCI (mild cognitive impairment)    Discharge Instructions  Discharge Instructions     Diet - low sodium heart healthy   Complete by: As directed    Discharge instructions   Complete by: As directed    1)Please take prescribed medications as instructed 2)Follow up with hospice services at home.   Increase activity slowly   Complete by: As directed       Allergies as of 01/29/2023       Reactions   Hydralazine Hcl Itching   ENTIRE BODY = burning sensation, also in the eyes (they have become very sensitive to light)   Atorvastatin Other (See Comments)   Per MD - pt not sure of reaction         Medication List     STOP taking these medications    docusate sodium 100 MG capsule Commonly known as: COLACE   lisinopril 40 MG tablet Commonly known as: ZESTRIL   loratadine 10 MG tablet Commonly known as: Claritin   QUEtiapine 25 MG tablet Commonly known as: SEROQUEL   QUEtiapine 50 MG tablet Commonly known as: SEROQUEL       TAKE these medications    amLODipine 10 MG tablet Commonly known as: NORVASC Take 1 tablet (10 mg total) by mouth daily.   aspirin EC 81 MG tablet Take 1 tablet (81 mg total) by mouth daily. Swallow whole. What changed: when to take this   azelastine 0.1 % nasal spray Commonly known as: ASTELIN Place 2 sprays into both nostrils 2 (two) times daily.   carvedilol 12.5 MG  tablet Commonly known as: COREG Take 1 tablet (12.5 mg total) by mouth 2 (two) times daily with a meal. What changed:  medication strength how much to take   cetirizine 10 MG tablet Commonly known as: ZYRTEC Take 10 mg by mouth daily as needed for allergies.   cyanocobalamin 1000 MCG tablet Take 1 tablet (1,000 mcg total) by mouth daily.   glucose blood test strip Commonly known as: ONE TOUCH ULTRA TEST Use to check blood sugar 3 times daily Dx code E11.65 What changed:  how much to take how to take this when to take this   haloperidol 1 MG tablet Commonly known as: HALDOL Take 1 tablet (1 mg total) by mouth every 3 (three) hours as needed for agitation.   iron polysaccharides 150 MG capsule Commonly known as: NIFEREX Take 1 capsule (150 mg total) by mouth daily.   isosorbide mononitrate 30 MG 24 hr tablet Commonly known as: IMDUR Take 1 tablet (30 mg total) by mouth daily. Start taking on: January 30, 2023 What changed:  medication strength how much to take   Ozempic (0.25  or 0.5 MG/DOSE) 2 MG/3ML Sopn Generic drug: Semaglutide(0.25 or 0.5MG /DOS) Inject 0.25 mg into the skin once a week.   pantoprazole 20 MG tablet Commonly known as: PROTONIX Take 1 tablet (20 mg total) by mouth 2 (two) times daily. What changed:  how much to take when to take this   polyethylene glycol 17 g packet Commonly known as: MIRALAX / GLYCOLAX Take 17 g by mouth daily. Start taking on: January 30, 2023   rosuvastatin 40 MG tablet Commonly known as: CRESTOR Take 1 tablet (40 mg total) by mouth daily.   senna 8.6 MG Tabs tablet Commonly known as: SENOKOT Take 1 tablet (8.6 mg total) by mouth daily. Start taking on: January 30, 2023   sodium zirconium cyclosilicate 10 g Pack packet Commonly known as: LOKELMA Take 10 g by mouth daily. Start taking on: January 30, 2023   sucralfate 1 g tablet Commonly known as: Carafate Take 1 tablet (1 g total) by mouth with breakfast, with lunch, and with  evening meal for 7 days.   thiamine 100 MG tablet Commonly known as: Vitamin B-1 Take 1 tablet (100 mg total) by mouth daily.        Follow-up Information     [facility] Follow up.   Specialty: Hospice and Palliative Medicine Why: [facility] will be following up regarding hospice services. Contact information: [address] [place] [address] [phone]        Malka So., MD. Schedule an appointment as soon as possible for a visit in 1 week(s).   Specialty: Internal Medicine Contact information: [address] [place] [address] Laurell Josephs [address] [facility] [facility] [place] [address] [phone]                Allergies  Allergen Reactions   Hydralazine Hcl Itching    ENTIRE BODY = burning sensation, also in the eyes (they have become very sensitive to light)   Atorvastatin Other (See Comments)    Per MD - pt not sure of reaction     Consultations: Psychiatry, palliative care, nephrology   Procedures/Studies: DG Chest 2 View  Result Date: 01/21/2023 CLINICAL DATA:  Chest pressure and drop in hemoglobin EXAM: CHEST - 2 VIEW COMPARISON:  Chest radiograph dated 12/26/2022 FINDINGS: Normal lung volumes. Increased bilateral interstitial opacities. New trace bilateral pleural effusions. No pneumothorax. The heart size and mediastinal contours are within normal limits. No acute osseous abnormality. IMPRESSION: 1. Increased bilateral interstitial opacities, likely pulmonary edema. 2. New trace bilateral pleural effusions. Electronically Signed   By: Agustin Cree M.D.   On: 01/21/2023 13:28   CT Head Wo Contrast  Result Date: 01/03/2023 CLINICAL DATA:  Headache, new onset. EXAM: CT HEAD WITHOUT CONTRAST TECHNIQUE: Contiguous axial images were obtained from the base of the skull through the vertex without intravenous contrast. RADIATION DOSE REDUCTION: This exam was performed according to the departmental dose-optimization program which includes automated exposure control,  adjustment of the mA and/or kV according to patient size and/or use of iterative reconstruction technique. COMPARISON:  11/04/2022 FINDINGS: Brain: No evidence of acute infarction, hemorrhage, hydrocephalus, extra-axial collection or mass lesion/mass effect. Vascular: No hyperdense vessel or unexpected calcification. Skull: Normal. Negative for fracture or focal lesion. Sinuses/Orbits: No acute finding. IMPRESSION: No acute finding or explanation for headache. Electronically Signed   By: Tiburcio Pea M.D.   On: 01/03/2023 12:58      Subjective: Patient seen and examined at the bedside today.  Hemodynamically stable.  Sitting in the chair.  Very emotional and saying  that she wants to go home.  She was on room air.  She does not have lower extremity edema.  Denies any shortness of breath or cough.  Long discussion held with the niece today about her discharge plan.  She is agreeable for taking her home with hospice services  Discharge Exam: Vitals:   01/29/23 0613 01/29/23 1152  BP: (!) 160/50 (!) 167/68  Pulse: (!) 42 76  Resp:  16  Temp: 98.4 F (36.9 C) 97.8 F (36.6 C)  SpO2: 100% 100%   Vitals:   01/28/23 1739 01/28/23 2049 01/29/23 0613 01/29/23 1152  BP: 126/63 (!) 140/69 (!) 160/50 (!) 167/68  Pulse: 72 64 (!) 42 76  Resp: 18 16  16   Temp: 98.3 F (36.8 C) 98.7 F (37.1 C) 98.4 F (36.9 C) 97.8 F (36.6 C)  TempSrc:   Oral Oral  SpO2: 100% 100% 100% 100%  Weight:      Height:        General: Pt is alert, awake, not in acute distress,confused to time Cardiovascular: RRR, S1/S2 +, no rubs, no gallops Respiratory: CTA bilaterally, no wheezing, no rhonchi Abdominal: Soft, NT, ND, bowel sounds + Extremities: no edema, no cyanosis    The results of significant diagnostics from this hospitalization (including imaging, microbiology, ancillary and laboratory) are listed below for reference.     Microbiology: No results found for this or any previous visit (from the past  240 hour(s)).   Labs: BNP (last 3 results) Recent Labs    03/07/22 1215 08/30/22 1208 01/21/23 1808  BNP 27.6 58.5 350.8*   Basic Metabolic Panel: Recent Labs  Lab 01/25/23 0119 01/26/23 0118 01/27/23 1219 01/28/23 1107 01/29/23 1029  NA 140 136 140 142 137  K 5.2* 5.2* 4.6 4.4 4.4  CL 104 99 104 104 101  CO2 25 24 21* 23 22  GLUCOSE 128* 144* 162* 193* 167*  BUN 71* 85* 92* 85* 86*  CREATININE 6.39* 7.14* 7.65* 7.53* 7.00*  CALCIUM 7.8* 8.2* 8.8* 9.2 9.1   Liver Function Tests: Recent Labs  Lab 01/27/23 1219  AST 34  ALT 28  ALKPHOS 52  BILITOT 0.4  PROT 6.4*  ALBUMIN 3.1*   No results for input(s): "LIPASE", "AMYLASE" in the last 168 hours. Recent Labs  Lab 01/27/23 1530  AMMONIA 27   CBC: Recent Labs  Lab 01/23/23 0035 01/25/23 0119 01/28/23 1107 01/29/23 1029  WBC 5.6 5.6 6.0 6.3  HGB 7.3* 7.3* 9.4* 9.7*  HCT 22.8* 23.0* 29.9* 30.3*  MCV 88.7 89.1 89.0 90.2  PLT 209 219 314 306   Cardiac Enzymes: No results for input(s): "CKTOTAL", "CKMB", "CKMBINDEX", "TROPONINI" in the last 168 hours. BNP: Invalid input(s): "POCBNP" CBG: Recent Labs  Lab 01/28/23 0847 01/28/23 1222 01/28/23 1729 01/28/23 2051 01/29/23 0619  GLUCAP 131* 158* 232* 107* 116*   D-Dimer No results for input(s): "DDIMER" in the last 72 hours. Hgb A1c No results for input(s): "HGBA1C" in the last 72 hours. Lipid Profile No results for input(s): "CHOL", "HDL", "LDLCALC", "TRIG", "CHOLHDL", "LDLDIRECT" in the last 72 hours. Thyroid function studies No results for input(s): "TSH", "T4TOTAL", "T3FREE", "THYROIDAB" in the last 72 hours.  Invalid input(s): "FREET3" Anemia work up No results for input(s): "VITAMINB12", "FOLATE", "FERRITIN", "TIBC", "IRON", "RETICCTPCT" in the last 72 hours. Urinalysis    Component Value Date/Time   COLORURINE YELLOW 12/22/2022 0028   APPEARANCEUR CLEAR 12/22/2022 0028   LABSPEC 1.011 12/22/2022 0028   PHURINE 6.0 12/22/2022 0028  GLUCOSEU NEGATIVE 12/22/2022 0028   HGBUR NEGATIVE 12/22/2022 0028   BILIRUBINUR NEGATIVE 12/22/2022 0028   KETONESUR NEGATIVE 12/22/2022 0028   PROTEINUR >=300 (A) 12/22/2022 0028   UROBILINOGEN 0.2 09/22/2013 1451   NITRITE NEGATIVE 12/22/2022 0028   LEUKOCYTESUR TRACE (A) 12/22/2022 0028   Sepsis Labs Recent Labs  Lab 01/23/23 0035 01/25/23 0119 01/28/23 1107 01/29/23 1029  WBC 5.6 5.6 6.0 6.3   Microbiology No results found for this or any previous visit (from the past 240 hour(s)).  Please note: You were cared for by a hospitalist during your hospital stay. Once you are discharged, your primary care physician will handle any further medical issues. Please note that NO REFILLS for any discharge medications will be authorized once you are discharged, as it is imperative that you return to your primary care physician (or establish a relationship with a primary care physician if you do not have one) for your post hospital discharge needs so that they can reassess your need for medications and monitor your lab values.    Time coordinating discharge: 40 minutes  SIGNED:   Burnadette Pop, MD  [facility] 01/29/2023, 12:18 PM Pager [phone]  If 7PM-7AM, please contact night-coverage [email or web address] Password [email or web address]

## 2023-01-29 NOTE — Plan of Care (Signed)
Problem: Education: Goal: Ability to describe self-care measures that may prevent or decrease complications (Diabetes Survival Skills Education) will improve 01/29/2023 1327 by Earlie Raveling, RN Outcome: Adequate for Discharge 01/29/2023 1327 by Earlie Raveling, RN Outcome: Adequate for Discharge Goal: Individualized Educational Video(s) 01/29/2023 1327 by Earlie Raveling, RN Outcome: Adequate for Discharge 01/29/2023 1327 by Earlie Raveling, RN Outcome: Adequate for Discharge   Problem: Coping: Goal: Ability to adjust to condition or change in health will improve 01/29/2023 1327 by Earlie Raveling, RN Outcome: Adequate for Discharge 01/29/2023 1327 by Earlie Raveling, RN Outcome: Adequate for Discharge   Problem: Fluid Volume: Goal: Ability to maintain a balanced intake and output will improve 01/29/2023 1327 by Earlie Raveling, RN Outcome: Adequate for Discharge 01/29/2023 1327 by Earlie Raveling, RN Outcome: Adequate for Discharge   Problem: Health Behavior/Discharge Planning: Goal: Ability to identify and utilize available resources and services will improve 01/29/2023 1327 by Earlie Raveling, RN Outcome: Adequate for Discharge 01/29/2023 1327 by Earlie Raveling, RN Outcome: Adequate for Discharge Goal: Ability to manage health-related needs will improve 01/29/2023 1327 by Earlie Raveling, RN Outcome: Adequate for Discharge 01/29/2023 1327 by Earlie Raveling, RN Outcome: Adequate for Discharge   Problem: Metabolic: Goal: Ability to maintain appropriate glucose levels will improve 01/29/2023 1327 by Earlie Raveling, RN Outcome: Adequate for Discharge 01/29/2023 1327 by Earlie Raveling, RN Outcome: Adequate for Discharge   Problem: Nutritional: Goal: Maintenance of adequate nutrition will improve 01/29/2023 1327 by Earlie Raveling, RN Outcome: Adequate for Discharge 01/29/2023 1327 by Earlie Raveling, RN Outcome: Adequate for Discharge Goal: Progress toward achieving an optimal weight will  improve 01/29/2023 1327 by Earlie Raveling, RN Outcome: Adequate for Discharge 01/29/2023 1327 by Earlie Raveling, RN Outcome: Adequate for Discharge   Problem: Skin Integrity: Goal: Risk for impaired skin integrity will decrease 01/29/2023 1327 by Earlie Raveling, RN Outcome: Adequate for Discharge 01/29/2023 1327 by Earlie Raveling, RN Outcome: Adequate for Discharge   Problem: Tissue Perfusion: Goal: Adequacy of tissue perfusion will improve 01/29/2023 1327 by Earlie Raveling, RN Outcome: Adequate for Discharge 01/29/2023 1327 by Earlie Raveling, RN Outcome: Adequate for Discharge   Problem: Education: Goal: Knowledge of General Education information will improve Description: Including pain rating scale, medication(s)/side effects and non-pharmacologic comfort measures 01/29/2023 1327 by Earlie Raveling, RN Outcome: Adequate for Discharge 01/29/2023 1327 by Earlie Raveling, RN Outcome: Adequate for Discharge   Problem: Health Behavior/Discharge Planning: Goal: Ability to manage health-related needs will improve 01/29/2023 1327 by Earlie Raveling, RN Outcome: Adequate for Discharge 01/29/2023 1327 by Earlie Raveling, RN Outcome: Adequate for Discharge   Problem: Clinical Measurements: Goal: Ability to maintain clinical measurements within normal limits will improve 01/29/2023 1327 by Earlie Raveling, RN Outcome: Adequate for Discharge 01/29/2023 1327 by Earlie Raveling, RN Outcome: Adequate for Discharge Goal: Will remain free from infection 01/29/2023 1327 by Earlie Raveling, RN Outcome: Adequate for Discharge 01/29/2023 1327 by Earlie Raveling, RN Outcome: Adequate for Discharge Goal: Diagnostic test results will improve 01/29/2023 1327 by Earlie Raveling, RN Outcome: Adequate for Discharge 01/29/2023 1327 by Earlie Raveling, RN Outcome: Adequate for Discharge Goal: Respiratory complications will improve 01/29/2023 1327 by Earlie Raveling, RN Outcome: Adequate for Discharge 01/29/2023 1327 by  Earlie Raveling, RN Outcome: Adequate for Discharge Goal: Cardiovascular complication will be avoided 01/29/2023 1327 by Earlie Raveling, RN Outcome: Adequate for Discharge 01/29/2023 1327 by Earlie Raveling, RN Outcome: Adequate for Discharge   Problem: Activity: Goal: Risk for activity intolerance will decrease 01/29/2023 1327 by Earlie Raveling, RN Outcome: Adequate for Discharge 01/29/2023  1327 by Earlie Raveling, RN Outcome: Adequate for Discharge   Problem: Nutrition: Goal: Adequate nutrition will be maintained 01/29/2023 1327 by Earlie Raveling, RN Outcome: Adequate for Discharge 01/29/2023 1327 by Earlie Raveling, RN Outcome: Adequate for Discharge   Problem: Coping: Goal: Level of anxiety will decrease 01/29/2023 1327 by Earlie Raveling, RN Outcome: Adequate for Discharge 01/29/2023 1327 by Earlie Raveling, RN Outcome: Adequate for Discharge   Problem: Elimination: Goal: Will not experience complications related to bowel motility 01/29/2023 1327 by Earlie Raveling, RN Outcome: Adequate for Discharge 01/29/2023 1327 by Earlie Raveling, RN Outcome: Adequate for Discharge Goal: Will not experience complications related to urinary retention 01/29/2023 1327 by Earlie Raveling, RN Outcome: Adequate for Discharge 01/29/2023 1327 by Earlie Raveling, RN Outcome: Adequate for Discharge   Problem: Pain Managment: Goal: General experience of comfort will improve 01/29/2023 1327 by Earlie Raveling, RN Outcome: Adequate for Discharge 01/29/2023 1327 by Earlie Raveling, RN Outcome: Adequate for Discharge   Problem: Safety: Goal: Ability to remain free from injury will improve 01/29/2023 1327 by Earlie Raveling, RN Outcome: Adequate for Discharge 01/29/2023 1327 by Earlie Raveling, RN Outcome: Adequate for Discharge   Problem: Skin Integrity: Goal: Risk for impaired skin integrity will decrease 01/29/2023 1327 by Earlie Raveling, RN Outcome: Adequate for Discharge 01/29/2023 1327 by Earlie Raveling,  RN Outcome: Adequate for Discharge   Problem: Safety: Goal: Violent Restraint(s) 01/29/2023 1327 by Earlie Raveling, RN Outcome: Adequate for Discharge 01/29/2023 1327 by Earlie Raveling, RN Outcome: Adequate for Discharge

## 2023-01-29 NOTE — Progress Notes (Signed)
Physical Therapy Treatment Patient Details Name: Teresa Poole MRN: [id number] DOB: 1951-07-19 Today's Date: 01/29/2023   History of Present Illness Pt is a 72 y/o F presenting to ED on 5/30 from PCP with low hgb and SOB x1 month, found to be voume overloaded with worsening kidney function, admitted for acute CHF exacerbation. PMH includes CHF, CKD IV, HTN, DM2    PT Comments    Pt very pleasant and cooperative today. She demo independence with bed mobility and transfers. Supervision provided for hallway amb 500' without AD. No further skilled PT intervention indicated. Pt to continue mobilizing with mobility team and nursing, if she remains inpatient. No follow up PT services indicated.    Recommendations for follow up therapy are one component of a multi-disciplinary discharge planning process, led by the attending physician.  Recommendations may be updated based on patient status, additional functional criteria and insurance authorization.  Follow Up Recommendations       Assistance Recommended at Discharge Frequent or constant Supervision/Assistance (due to impaired cognition)  Patient can return home with the following Direct supervision/assist for medications management;Direct supervision/assist for financial management;Assist for transportation;Assistance with cooking/housework   Equipment Recommendations  None recommended by PT    Recommendations for Other Services       Precautions / Restrictions Precautions Precautions: Other (comment) Precaution Comments: safety sitter, impulsivity     Mobility  Bed Mobility Overal bed mobility: Independent                  Transfers Overall transfer level: Independent Equipment used: None                    Ambulation/Gait Ambulation/Gait assistance: Supervision Gait Distance (Feet): 500 Feet Assistive device: None Gait Pattern/deviations: WFL(Within Functional Limits) Gait velocity: WFL Gait velocity  interpretation: >4.37 ft/sec, indicative of normal walking speed   General Gait Details: supervision for safety, steady gait   Stairs             Wheelchair Mobility    Modified Rankin (Stroke Patients Only)       Balance Overall balance assessment: No apparent balance deficits (not formally assessed)                                          Cognition Arousal/Alertness: Awake/alert Behavior During Therapy: WFL for tasks assessed/performed Overall Cognitive Status: No family/caregiver present to determine baseline cognitive functioning                                 General Comments: Pleasant and cooperative today        Exercises      General Comments General comments (skin integrity, edema, etc.): VSS on RA      Pertinent Vitals/Pain Pain Assessment Pain Assessment: No/denies pain    Home Living                          Prior Function            PT Goals (current goals can now be found in the care plan section) Acute Rehab PT Goals Patient Stated Goal: home Progress towards PT goals: Goals met/education completed, patient discharged from PT    Frequency    Min 2X/week      PT Plan Current  plan remains appropriate    Co-evaluation              AM-PAC PT "6 Clicks" Mobility   Outcome Measure  Help needed turning from your back to your side while in a flat bed without using bedrails?: None Help needed moving from lying on your back to sitting on the side of a flat bed without using bedrails?: None Help needed moving to and from a bed to a chair (including a wheelchair)?: None Help needed standing up from a chair using your arms (e.g., wheelchair or bedside chair)?: None Help needed to walk in hospital room?: A Little Help needed climbing 3-5 steps with a railing? : A Little 6 Click Score: 22    End of Session   Activity Tolerance: Patient tolerated treatment well Patient left: in  chair;with call bell/phone within reach;with nursing/sitter in room Nurse Communication: Mobility status PT Visit Diagnosis: Unsteadiness on feet (R26.81)     Time: 1610-9604 PT Time Calculation (min) (ACUTE ONLY): 10 min  Charges:  $Gait Training: 8-22 mins                     Teresa Glassing., PT  Office # [phone]    Teresa Poole 01/29/2023, 11:30 AM

## 2023-01-29 NOTE — TOC Progression Note (Signed)
Transition of Care [facility]) - Progression Note    Patient Details  Name: Teresa Poole MRN: [id number] Date of Birth: 18-Aug-1951  Transition of Care [facility]) CM/SW Contact  Janae Bridgeman, RN Phone Number: 01/29/2023, 8:50 AM  Clinical Narrative:    CM spoke with the care team providers and patient was recommended to have [facility] agency involved with the patient in the home alone with family provided care.   I called [facility] and cancelled the referral for [facility] RN at the request of attending team since [facility] will provide care in the home to support symptom management, medication management and supportive therpies under hospice benefit.  I spoke with the patient at length at the bedside on 01/28/2023 and she was agreeable to [facility] hospice to be involved in her care at the home.  CM will continue to follow the patient for TOC needs.   Expected Discharge Plan: Home w Hospice Care Barriers to Discharge: Continued Medical Work up  Expected Discharge Plan and Services   Discharge Planning Services: CM Consult Post Acute Care Choice:  (To be determined) Living arrangements for the past 2 months: Apartment                                       Social Determinants of Health (SDOH) Interventions SDOH Screenings   Food Insecurity: No Food Insecurity (01/21/2023)  Housing: Patient Declined (01/21/2023)  Transportation Needs: No Transportation Needs (01/21/2023)  Utilities: Not At Risk (01/21/2023)  Tobacco Use: Low Risk  (01/21/2023)    Readmission Risk Interventions    01/28/2023    1:56 PM 12/22/2022    2:40 PM 10/28/2021    2:56 PM  Readmission Risk Prevention Plan  Transportation Screening Complete Complete Complete  Medication Review (RN Care Manager) Complete Referral to Pharmacy Complete  PCP or Specialist appointment within 3-5 days of discharge Complete Complete Complete  HRI or Home Care Consult Complete  Complete Complete  SW Recovery Care/Counseling Consult Complete Complete Complete  Palliative Care Screening Complete Not Applicable Not Applicable  Skilled Nursing Facility Not Applicable Not Applicable Not Applicable

## 2023-01-29 NOTE — Progress Notes (Signed)
Patient refused to keep her telemetry on.

## 2023-01-29 NOTE — Telephone Encounter (Signed)
Patient Advocate Encounter   Received notification that prior authorization for Lokelma 10GM packets is required.   PA submitted on 01/29/2023 [facility] Insurance [place] Electronic PA Form Status is pending       Roland Earl, CPhT Pharmacy Patient Advocate Specialist [facility] Patient Advocate Team Direct Number: [phone]  Fax: [phone]

## 2023-01-29 NOTE — Telephone Encounter (Signed)
Patient Advocate Encounter  Prior Authorization for Haloperidol 1MG  tablets  has been approved.    PA# [id number] Insurance [employer] Electronic PA Form  Effective dates: 01/29/2023 through 08/24/2023      Roland Earl, CPhT Pharmacy Patient Advocate Specialist [facility] Patient Advocate Team Direct Number: [phone]  Fax: [phone]

## 2023-01-29 NOTE — Progress Notes (Signed)
Daily Progress Note   Patient Name: Teresa Poole       Date: 01/29/2023 DOB: [birth year]  Age: 72 y.o. MRN#: [id number] Attending Physician: Burnadette Pop, MD Primary Care Physician: Malka So., MD Admit Date: 01/21/2023  Reason for Consultation/Follow-up: Establishing goals of care  Subjective: I have reviewed medical records including EPIC notes, MAR, and labs. Received report from primary RN - no acute concerns. RN reports patient refused all medications except Haldol this morning; remains agitated. RN plans to give patient's niece her car  keys when patient is picked up for discharge today.  Went to visit patient at bedside - no family/visitors present. Three [facility] staff members present at bedside, including sitter. Patient is sitting on EOB calmly speaking with staff; however, does seem irritated. She is asking about her personal belongings and her car.   Continued care plan/safe discharge plan coordination with TOC, Dr. Renford Dills, [facility] hospice liaison, and Psych NP.  Length of Stay: 5  Current Medications: Scheduled Meds:   sodium chloride   Intravenous Once   amLODipine  10 mg Oral Daily   calcium carbonate  1 tablet Oral BID   carvedilol  12.5 mg Oral BID WC   docusate sodium  100 mg Oral BID   heparin  5,000 Units Subcutaneous Q8H   insulin aspart  0-9 Units Subcutaneous TID WC   isosorbide mononitrate  30 mg Oral Daily   LORazepam  2 mg Intramuscular Once   pantoprazole  20 mg Oral BID   polyethylene glycol  17 g Oral Daily   rosuvastatin  40 mg Oral Daily   senna  1 tablet Oral Daily   sodium zirconium cyclosilicate  10 g Oral Daily   thiamine  100 mg Oral Daily    Continuous Infusions:  valproate sodium 250 mg (01/28/23 2158)    PRN Meds: haloperidol  **OR** haloperidol lactate, haloperidol lactate, mouth rinse  Physical Exam Vitals and nursing note reviewed.  Constitutional:      General: She is not in acute distress. Pulmonary:     Effort: No respiratory distress.  Skin:    General: Skin is warm and dry.  Neurological:     Mental Status: She is alert.  Psychiatric:        Cognition and Memory: Cognition is impaired.  Judgment: Judgment is impulsive.             Vital Signs: BP (!) 160/50 (BP Location: Left Arm)   Pulse (!) 42   Temp 98.4 F (36.9 C) (Oral)   Resp 16   Ht 5\' 7"  (1.702 m)   Wt 77.6 kg   SpO2 100%   BMI 26.79 kg/m  SpO2: SpO2: 100 % O2 Device: O2 Device: Room Air O2 Flow Rate:    Intake/output summary:  Intake/Output Summary (Last 24 hours) at 01/29/2023 1017 Last data filed at 01/29/2023 0522 Gross per 24 hour  Intake 892.5 ml  Output --  Net 892.5 ml   LBM: Last BM Date : 01/25/23 Baseline Weight: Weight: 72 kg Most recent weight: Weight: 77.6 kg       Palliative Assessment/Data: PPS 70%      Patient Active Problem List   Diagnosis Date Noted   MCI (mild cognitive impairment) 01/27/2023   Delirium due to another medical condition 01/22/2023   Acute exacerbation of CHF (congestive heart failure) (HCC) 01/21/2023   Acute on chronic anemia 12/22/2022   Melena 12/21/2022   QT prolongation 12/21/2022   Elevated troponin I level 11/10/2022   Hypertensive emergency 11/09/2022   Demand ischemia 11/09/2022   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) 11/07/2022   Acute kidney injury (HCC) 11/05/2022   Dizziness 04/17/2022   (HFpEF) heart failure with preserved ejection fraction (HCC) 02/28/2022   Hypomagnesemia 02/28/2022   Manic episode, unspecified (HCC)    COVID-19 virus infection 10/25/2021   UTI (urinary tract infection) 10/25/2021   Anemia 10/25/2021   Suicidal ideations 10/17/2021   Acute renal failure superimposed on  stage 4 chronic kidney disease (HCC) 10/14/2021   ARF (acute renal failure) (HCC) 10/14/2021   Small bowel obstruction (HCC)    SBO (small bowel obstruction) (HCC) 09/22/2021   Adjustment disorder with anxiety 06/10/2021   Atypical chest pain 11/27/2018   Renal artery stenosis (HCC) 10/11/2018   Hyperlipidemia associated with type 2 diabetes mellitus (HCC) 10/26/2013   Chronic systolic heart failure (HCC) 09/28/2013   CKD (chronic kidney disease), stage IV (HCC) 09/24/2013   Hypertensive urgency 09/18/2013   Cardiomyopathy, hypertensive (HCC) 09/18/2013   Edema 09/14/2013   Acute systolic heart failure (HCC) 09/09/2013   Hypertensive heart disease with CHF (congestive heart failure) (HCC) 09/09/2013   Hypertension 09/07/2013   Type 2 diabetes mellitus with chronic kidney disease, without long-term current use of insulin (HCC) 09/07/2013   Obesity (BMI 30-39.9)    History of ovarian cancer     Palliative Care Assessment & Plan   Patient Profile: 72 y.o. female  with past medical history of combined systolic/diastolic CHF, CKD stage IV, hypertension, diabetes type 2, coronary artery disease, obesity, renal artery stenosis admitted on 01/21/2023 with abnormal labs at PCP visit.    Patient was found to have a hemoglobin of 7 at PCPs office.  Upon further evaluation in the ED, patient was admitted for acute exacerbation of CHF and acute kidney injury on CKD stage V. PMT has been consulted to assist with goals of care conversation.  Assessment: Principal Problem:   Acute exacerbation of CHF (congestive heart failure) (HCC) Active Problems:   Hypertension   Type 2 diabetes mellitus with chronic kidney disease, without long-term current use of insulin (HCC)   Acute renal failure superimposed on stage 4 chronic kidney disease (HCC)   Anemia   Delirium due to another medical condition  MCI (mild cognitive impairment)   Concern about end of life  Recommendations/Plan: Plan is for  patient's discharge home with hospice today TOC has provided family with private duty caregiver information as well as memory care information  Goals of Care and Additional Recommendations: Limitations on Scope of Treatment: Avoid Hospitalization and Full Scope Treatment  Code Status:    Code Status Orders  (From admission, onward)           Start     Ordered   01/28/23 1136  Do not attempt resuscitation (DNR)  Continuous       Question Answer Comment  If patient has no pulse and is not breathing Do Not Attempt Resuscitation   If patient has a pulse and/or is breathing: Medical Treatment Goals MEDICAL INTERVENTIONS DESIRED: Use advanced airway interventions, mechanical ventilation or cardioversion in appropriate circumstances; Use medication/IV fluids as indicated; Provide comfort medications; Transfer to Progressive/Stepdown/ICU as indicated.   Consent: Discussion documented in EHR or advanced directives reviewed      01/28/23 1135           Code Status History     Date Active Date Inactive Code Status Order ID Comments User Context   01/21/2023 1719 01/28/2023 1135 Full Code [id number]  Burnadette Pop, MD ED   12/21/2022 2334 12/22/2022 2051 Full Code [id number]  Therisa Doyne, MD ED   11/05/2022 0844 11/13/2022 2314 Full Code [id number]  Kirby Crigler, Mir Judie Petit, MD ED   04/17/2022 2318 04/18/2022 1800 Full Code [id number]  Marolyn Haller, MD ED   02/28/2022 1959 03/01/2022 1736 Full Code [id number]  Charlsie Quest, MD ED   10/25/2021 1911 10/28/2021 2006 Full Code [id number]  Orland Mustard, MD Inpatient   10/17/2021 0040 10/17/2021 1943 Full Code [id number]  Lorita Officer ED   10/14/2021 2158 10/15/2021 1332 DNR [id number]  Eduard Clos, MD Inpatient   06/10/2021 1155 06/11/2021 2111 DNR [id number]  Jonah Blue, MD Inpatient   06/10/2021 1147 06/10/2021 1154 Full Code [id number]  Jonah Blue, MD Inpatient   11/27/2018 0919 11/28/2018 1426 Full Code [id number]  Dorcas Carrow, MD  ED   10/04/2018 1816 10/07/2018 2123 Full Code [id number]  Lahoma Crocker, MD ED   09/18/2013 1725 09/26/2013 1707 Full Code [id number]  Kathleene Hazel, MD ED   09/07/2013 1837 09/10/2013 1412 Full Code [id number]  Dhungel, Theda Belfast, MD Inpatient       Prognosis:  < 6 months  Discharge Planning: Home with Hospice  Care plan was discussed with primary RN, TOC, psych NP, TOC, Dr. Renford Dills  Thank you for allowing the Palliative Medicine Team to assist in the care of this patient.   Total Time 35 minutes  Prolonged Time Billed  no       Greater than 50%  of this time was spent counseling and coordinating care related to the above assessment and plan.  Haskel Khan, NP  Please contact Palliative Medicine Team phone at [phone] for questions and concerns.   *Portions of this note are a verbal dictation therefore any spelling and/or grammatical errors are due to the "Dragon Medical One" system interpretation.

## 2023-01-29 NOTE — TOC Transition Note (Addendum)
Transition of Care [facility]) - CM/SW Discharge Note   Patient Details  Name: Teresa Poole MRN: [id number] Date of Birth: 1951/06/05  Transition of Care [facility]) CM/SW Contact:  Janae Bridgeman, RN Phone Number: 01/29/2023, 10:27 AM   Clinical Narrative:    CM called and spoke with the patient's sister, Teresa Poole by phone and updated that patient was accepted by [facility] and patient will be discharging home today.  The patient lives alone and the sister plans to have her daughter/husband pick the patient up for discharge today to return home.  Teresa Poole, sister is aware that patient has been advised not to drive and that physician has completed documentation at [facility] accordingly.    The patient lives alone and sister/family is unable to provide care in their home since the patient has been aggressive with the sister in the past.  Teresa Poole, sister was provided with private pay home health agencies that provide 24 hour care that is not payable through [employer], sister provided with company names and numbers to contact including [place], [employer], [facility], [facility], [facility] for family to contact and provide care.    [facility], Teresa Poole is aware that patient is discharging home today and will follow up regarding admission.  The patient's sister's daughter/husband are in town and coming to the hospital to receive discharge instructions and will provide patient's safe transportation home in the patient's care.  Patient is not to drive and family is aware.  The sister, Teresa Poole, was also provide with contact information for Carmelia Bake, Senior Care Consultant if patient/family would like to place patient in a memory care facility at a later date in the outpatient setting.  Discharge medications will be provided through the [facility] pharmacy when patient is discharged home.  Bedside nursing and all involved Medical Team members  are coordinating patient's discharge to home today with referral to [facility] noted in the AVS and family is aware.  01/29/23 1217 - I spoke with the patient's niece, Teresa Poole who plans to take the patient to her home or the patient's home depending of the patient's preference.  The niece states that the patient has had a cluttered home for years and chooses to sleep on the couch.  The patient's niece states that her mom and father are both attorneys are have been aware of the patient's living condition. I spoke with the attending physician and he is speaking with the niece at this time regarding her questions.  MD will place the Dementia diagnosis in the discharge summary that will be provided to the niece so that the sister is able to assist the patient with personal care services and possible placement at a Memory Care facility if possible in the OUtpatient setting.  [facility] is aware and niece was notified that if RN admits the patient for services and has concerns regarding the patient's living environment then the facility or family should call Adult Protective Services.  Teresa Band, LCSW was updated regarding the above note and patient will be discharged home with family.  01/29/23 1230 - CM spoke with the niece, Teresa Poole at the bedside and she plans to take the patient to her home for care.  Teresa Poole, Niece was provided with discharge summary that attending physician provided the memory diagnosis that would provide the sister, who is a Clinical research associate with the ability to assist the patient with placement at a Memory care facility in the outpatient setting.  The patient's sister, Teresa Poole will assume the role of Healthcare POA and financial POA per the niece at this time.  The niece states that the patient has agreed to go voluntarily to a memory care facility of choice once the family is able to obtain a preferred facility.    Patient will be discharged home with family (niece) to a family  home once bedside nursing provides the discharge teaching and medications for home.  Final next level of care: Home w Hospice Care Barriers to Discharge: No Barriers Identified   Patient Goals and CMS Choice CMS Medicare.gov Compare Post Acute Care list provided to:: Patient Choice offered to / list presented to : Patient  Discharge Placement                         Discharge Plan and Services Additional resources added to the After Visit Summary for     Discharge Planning Services: CM Consult Post Acute Care Choice:  (To be determined)                               Social Determinants of Health (SDOH) Interventions SDOH Screenings   Food Insecurity: No Food Insecurity (01/21/2023)  Housing: Patient Declined (01/21/2023)  Transportation Needs: No Transportation Needs (01/21/2023)  Utilities: Not At Risk (01/21/2023)  Tobacco Use: Low Risk  (01/21/2023)     Readmission Risk Interventions    01/28/2023    1:56 PM 12/22/2022    2:40 PM 10/28/2021    2:56 PM  Readmission Risk Prevention Plan  Transportation Screening Complete Complete Complete  Medication Review [employer]) Complete Referral to Pharmacy Complete  PCP or Specialist appointment within 3-5 days of discharge Complete Complete Complete  HRI or Home Care Consult Complete Complete Complete  SW Recovery Care/Counseling Consult Complete Complete Complete  Palliative Care Screening Complete Not Applicable Not Applicable  Skilled Nursing Facility Not Applicable Not Applicable Not Applicable

## 2023-02-01 ENCOUNTER — Other Ambulatory Visit: Payer: Self-pay | Admitting: Nurse Practitioner

## 2023-02-01 MED ORDER — VELTASSA 8.4 G PO PACK: 8.4000 g | PACK | Freq: Every day | ORAL | 0 refills | Status: DC

## 2023-02-01 NOTE — Progress Notes (Signed)
Lokelma not covered by insurance. [facility] called office and offered alternative that is covered. Valtessa 8.4 mg ordered and sent to pharmacy

## 2023-02-16 ENCOUNTER — Ambulatory Visit: Payer: Medicare PPO | Attending: Internal Medicine

## 2023-02-22 ENCOUNTER — Encounter (HOSPITAL_COMMUNITY): Payer: Self-pay | Admitting: Emergency Medicine

## 2023-02-22 ENCOUNTER — Emergency Department (HOSPITAL_COMMUNITY)
Admission: EM | Admit: 2023-02-22 | Discharge: 2023-02-23 | Disposition: A | Discharge status: Home / Self Care | Attending: Emergency Medicine | Admitting: Emergency Medicine

## 2023-02-22 ENCOUNTER — Other Ambulatory Visit: Payer: Self-pay

## 2023-02-22 DIAGNOSIS — Z8543 Personal history of malignant neoplasm of ovary: Secondary | ICD-10-CM | POA: Diagnosis not present

## 2023-02-22 DIAGNOSIS — E1122 Type 2 diabetes mellitus with diabetic chronic kidney disease: Secondary | ICD-10-CM | POA: Insufficient documentation

## 2023-02-22 DIAGNOSIS — R410 Disorientation, unspecified: Secondary | ICD-10-CM | POA: Insufficient documentation

## 2023-02-22 DIAGNOSIS — I130 Hypertensive heart and chronic kidney disease with heart failure and stage 1 through stage 4 chronic kidney disease, or unspecified chronic kidney disease: Secondary | ICD-10-CM | POA: Insufficient documentation

## 2023-02-22 DIAGNOSIS — Z7982 Long term (current) use of aspirin: Secondary | ICD-10-CM | POA: Insufficient documentation

## 2023-02-22 DIAGNOSIS — N189 Chronic kidney disease, unspecified: Secondary | ICD-10-CM | POA: Diagnosis not present

## 2023-02-22 DIAGNOSIS — Z1152 Encounter for screening for COVID-19: Secondary | ICD-10-CM | POA: Diagnosis not present

## 2023-02-22 DIAGNOSIS — R600 Localized edema: Secondary | ICD-10-CM | POA: Diagnosis not present

## 2023-02-22 DIAGNOSIS — Z79899 Other long term (current) drug therapy: Secondary | ICD-10-CM | POA: Insufficient documentation

## 2023-02-22 DIAGNOSIS — I2510 Atherosclerotic heart disease of native coronary artery without angina pectoris: Secondary | ICD-10-CM | POA: Diagnosis not present

## 2023-02-22 DIAGNOSIS — R0981 Nasal congestion: Secondary | ICD-10-CM | POA: Diagnosis present

## 2023-02-22 DIAGNOSIS — F0390 Unspecified dementia without behavioral disturbance: Secondary | ICD-10-CM | POA: Insufficient documentation

## 2023-02-22 DIAGNOSIS — I509 Heart failure, unspecified: Secondary | ICD-10-CM | POA: Insufficient documentation

## 2023-02-22 NOTE — ED Triage Notes (Signed)
Pt c/o left hand cramping that started 6 minutes pta per the pt while driving down the road. Pt able to straighten hand out, but still feels a cramp in her hand. Pt states "it feels like my hand is in cement."

## 2023-02-23 ENCOUNTER — Emergency Department (HOSPITAL_COMMUNITY)

## 2023-02-23 LAB — COMPREHENSIVE METABOLIC PANEL
ALT: 12 U/L (ref 0–44)
AST: 18 U/L (ref 15–41)
Albumin: 3 g/dL — ABNORMAL LOW (ref 3.5–5.0)
Alkaline Phosphatase: 62 U/L (ref 38–126)
Anion gap: 10 (ref 5–15)
BUN: 62 mg/dL — ABNORMAL HIGH (ref 8–23)
CO2: 21 mmol/L — ABNORMAL LOW (ref 22–32)
Calcium: 7.9 mg/dL — ABNORMAL LOW (ref 8.9–10.3)
Chloride: 106 mmol/L (ref 98–111)
Creatinine, Ser: 4.69 mg/dL — ABNORMAL HIGH (ref 0.44–1.00)
GFR, Estimated: 9 mL/min — ABNORMAL LOW (ref >60)
Glucose, Bld: 112 mg/dL — ABNORMAL HIGH (ref 70–99)
Potassium: 4.6 mmol/L (ref 3.5–5.1)
Sodium: 137 mmol/L (ref 135–145)
Total Bilirubin: 0.3 mg/dL (ref 0.3–1.2)
Total Protein: 6.6 g/dL (ref 6.5–8.1)

## 2023-02-23 LAB — CBC WITH DIFFERENTIAL/PLATELET
Abs Immature Granulocytes: 0.01 10*3/uL (ref 0.00–0.07)
Basophils Absolute: 0 10*3/uL (ref 0.0–0.1)
Basophils Relative: 1 %
Eosinophils Absolute: 0.1 10*3/uL (ref 0.0–0.5)
Eosinophils Relative: 3 %
HCT: 32.4 % — ABNORMAL LOW (ref 36.0–46.0)
Hemoglobin: 9.9 g/dL — ABNORMAL LOW (ref 12.0–15.0)
Immature Granulocytes: 0 %
Lymphocytes Relative: 32 %
Lymphs Abs: 1.7 10*3/uL (ref 0.7–4.0)
MCH: 28.5 pg (ref 26.0–34.0)
MCHC: 30.6 g/dL (ref 30.0–36.0)
MCV: 93.4 fL (ref 80.0–100.0)
Monocytes Absolute: 0.4 10*3/uL (ref 0.1–1.0)
Monocytes Relative: 8 %
Neutro Abs: 3 10*3/uL (ref 1.7–7.7)
Neutrophils Relative %: 56 %
Platelets: 194 10*3/uL (ref 150–400)
RBC: 3.47 MIL/uL — ABNORMAL LOW (ref 3.87–5.11)
RDW: 14.6 % (ref 11.5–15.5)
WBC: 5.2 10*3/uL (ref 4.0–10.5)
nRBC: 0 % (ref 0.0–0.2)

## 2023-02-23 LAB — RESP PANEL BY RT-PCR (RSV, FLU A&B, COVID)  RVPGX2
Influenza A by PCR: NEGATIVE
Influenza B by PCR: NEGATIVE
Resp Syncytial Virus by PCR: NEGATIVE
SARS Coronavirus 2 by RT PCR: NEGATIVE

## 2023-02-23 LAB — BRAIN NATRIURETIC PEPTIDE: B Natriuretic Peptide: 122.4 pg/mL — ABNORMAL HIGH (ref 0.0–100.0)

## 2023-02-23 MED ORDER — PANTOPRAZOLE SODIUM 20 MG PO TBEC
20.0000 mg | DELAYED_RELEASE_TABLET | Freq: Two times a day (BID) | ORAL | Status: DC
  Administered 2023-02-23: 20 mg via ORAL
  Filled 2023-02-23 (×2): qty 1

## 2023-02-23 MED ORDER — AMLODIPINE BESYLATE 5 MG PO TABS
10.0000 mg | ORAL_TABLET | Freq: Every day | ORAL | Status: DC
  Administered 2023-02-23: 10 mg via ORAL
  Filled 2023-02-23: qty 2

## 2023-02-23 MED ORDER — ISOSORBIDE MONONITRATE ER 30 MG PO TB24
30.0000 mg | ORAL_TABLET | Freq: Every day | ORAL | Status: DC
  Administered 2023-02-23: 30 mg via ORAL
  Filled 2023-02-23: qty 1

## 2023-02-23 MED ORDER — CARVEDILOL 12.5 MG PO TABS
12.5000 mg | ORAL_TABLET | Freq: Two times a day (BID) | ORAL | Status: DC
  Administered 2023-02-23 (×2): 12.5 mg via ORAL
  Filled 2023-02-23 (×2): qty 1

## 2023-02-23 NOTE — ED Notes (Signed)
Pt has dementia, pt is alert to self and location, not to time and  some confusion with situation. Pt chief complaint is not clear. Pt stated she is having some congestions and concerns of possible hay fever. Provider has spoken to sister/emergency contact who confirmed dementia dx.

## 2023-02-23 NOTE — ED Notes (Signed)
Spoke on the phone with [name] regarding how to get pt home. I expressed my concerns letting the pt drive herself home, the sister stated that it is safe for her at this time to drive herself home.

## 2023-02-23 NOTE — ED Provider Notes (Signed)
Patient here boarding overnight for social work consult to facilitate home health.   1100am: Nursing staff reports patient is requesting to leave and drive home. TOC has been consulted and patient has home health set up. No other needs reported by patient. Questioning if she can drive home or not. She did drive her  Physical Exam  BP (!) 142/72   Pulse 65   Temp 98.1 F (36.7 C) (Oral)   Resp 17   Ht 5\' 7"  (1.702 m)   Wt 77.6 kg   SpO2 100%   BMI 26.79 kg/m   Physical Exam Vitals and nursing note reviewed.  Constitutional:      General: She is not in acute distress.    Appearance: Normal appearance. She is normal weight. She is not ill-appearing, toxic-appearing or diaphoretic.  HENT:     Head: Normocephalic and atraumatic.  Cardiovascular:     Rate and Rhythm: Normal rate.  Pulmonary:     Effort: Pulmonary effort is normal. No respiratory distress.  Musculoskeletal:        General: Normal range of motion.     Cervical back: Normal range of motion.  Skin:    General: Skin is warm and dry.  Neurological:     General: No focal deficit present.     Mental Status: She is alert and oriented to person, place, and time.  Psychiatric:        Mood and Affect: Mood normal.        Behavior: Behavior normal.     Procedures  Procedures  ED Course / MDM    Medical Decision Making Amount and/or Complexity of Data Reviewed Labs: ordered. Radiology: ordered.  Risk Prescription drug management.   Patient presents here initially with multiple complaints. Labs, EKG, and chest x-ray performed which were reassuring for acute findings. Chart reviewed, appears patient was confused overnight. Overnight staff spoke with patients sister who states this is her baseline but she has not been formally evaluated for her dementia. They have attempted to take her drivers license away and have not been successful with this of yet.   Social work consulted and home health was set up. After this,  patient states she wants to go home. For me she is alert and oriented x3 and is ambulatory with steady gait. She carries with her a valid drivers license. Nursing staff spoke with the patients sister again this morning who states that she can drive home. Given this with her lack of neurodeficits at this time, plan for discharge home. Evaluation and diagnostic testing in the emergency department does not suggest an emergent condition requiring admission or immediate intervention beyond what has been performed at this time.  Plan for discharge with close PCP follow-up.  Patient is understanding and amenable with plan, educated on red flag symptoms that would prompt immediate return.  Patient discharged in stable condition.    Vear Clock 02/23/23 1120    Arby Barrette, MD 02/23/23 [phone]

## 2023-02-23 NOTE — Progress Notes (Addendum)
Transition of Care [facility]) - Emergency Department Mini Assessment   Patient Details  Name: Teresa [place] MRN: [id number] Date of Birth: [birth year]  [facility]) CM/SW Contact:    Princella Ion, LCSW Phone Number: 02/23/2023, 9:47 AM   Clinical Narrative: Pt presented to ED with hand pain but was kept for further evaluation once BP and some labs were elevated. EDP noted that pt had recent admission at the beginning of June and was deemed to lack capacity at that time. Since then, the pt has remained her own legal guardian and briefly stayed with her sister before insisting she return home to live alone in her own house.   This CSW outreached to pt's sister, [name], who confirmed pt wishes to be independent and they allow her to do so. When asked if the pt has received a formal Dementia diagnoses, [name] stated she was told that the pt's cognition is impacted due to uncontrolled diabetes. [name] reported she is pt's Durable POA. 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 [name] reported they have taken the pt's license and unhooked the battery in her car; however, the pt navigated having the dealership tow her car there to be fixed, provide her a ride, and then she went to [facility] and reported her license stolen and received another one. [name] reported her PCP who is now in [place] will be submitting paperwork to [facility] to state that the pt cannot be driving before March 01, 2023. They are anticipating her license to be canceled at that time and [place] plans to take the pt's car away. [name] reports the pt stayed with she and her husband a few weeks after being discharged from the hospital but told her sister she wants to go home. [name] reported "when she wants something and you don't give it to her, she can get violent." [name] confirmed that [facility] comes out every Wednesday and that;s the only time the  pt allows her inside the home. The pt reportedly took [place] and her husband's extra keys to the home. This CSW did inform [name] that the pt would not be a rehab candidate at this time due to her ability to ambulate and independently care for herself. [name] verbalized understanding and stated the pt would not be willing to go anywhere other than her home. [name] states pt manages her own income and pays all her bills.    This CSW spoke with the pt who was aware she is currently in the hospital. The pt states she already has someone coming into the home once a week. When asked if she is interested in [facility], she initially reported "If that means I have to sit at home, wait around for someone to come, and let them in my front door, that's no way to live. To me, that is hell on earth. That is depressing and I don't want to live my life that way." This CSW did assure the pt that it is optional and pt began to process that she currently deals with kidney failure, does not have anyone in the home, and stated if there is ever a medical emergency, she is home alone and no one would know; therefore, she could use help in the home. This CSW informed that the agency will reach out to her and she can share with them that she prefers visits at 11 AM or  later. Pt verbalized understanding. Pt stated throughout conversation that she was 72 years old and was able to verify date of birth and home address in its entirety. Pt confirmed she will be driving herself home at discharge.   [facility] has accepted pt for [facility] RN to assist with Medication Management. Will request EDP place orders. Notified pt's sister, [name].    ED Mini Assessment: What brought you to the Emergency Department? : hand pain  Barriers to Discharge: No Barriers Identified     Means of departure: Car       Patient Contact and Communications Key Contact 1: Patient Key Contact 2: Labus-Buie,[name]  [phone] Spoke with:  Patient and sister Contact Date: 02/23/23,   Contact time: 0900 Contact Phone Number: Patient [phone]) and Chavers-Buie,[name] [phone] Call outcome: Patient willing to have [facility] RN come into the home for medication management         Admission diagnosis:  Severe body cramps, left side of body real stiff Patient Active Problem List   Diagnosis Date Noted   MCI (mild cognitive impairment) 01/27/2023   Delirium due to another medical condition 01/22/2023   Acute exacerbation of CHF (congestive heart failure) (HCC) 01/21/2023   Acute on chronic anemia 12/22/2022   Melena 12/21/2022   QT prolongation 12/21/2022   Elevated troponin I level 11/10/2022   Hypertensive emergency 11/09/2022   Demand ischemia 11/09/2022   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) 11/07/2022   Acute kidney injury (HCC) 11/05/2022   Dizziness 04/17/2022   (HFpEF) heart failure with preserved ejection fraction (HCC) 02/28/2022   Hypomagnesemia 02/28/2022   Manic episode, unspecified (HCC)    COVID-19 virus infection 10/25/2021   UTI (urinary tract infection) 10/25/2021   Anemia 10/25/2021   Suicidal ideations 10/17/2021   Acute renal failure superimposed on stage 4 chronic kidney disease (HCC) 10/14/2021   ARF (acute renal failure) (HCC) 10/14/2021   Small bowel obstruction (HCC)    SBO (small bowel obstruction) (HCC) 09/22/2021   Adjustment disorder with anxiety 06/10/2021   Atypical chest pain 11/27/2018   Renal artery stenosis (HCC) 10/11/2018   Hyperlipidemia associated with type 2 diabetes mellitus (HCC) 10/26/2013   Chronic systolic heart failure (HCC) 09/28/2013   CKD (chronic kidney disease), stage IV (HCC) 09/24/2013   Hypertensive urgency 09/18/2013   Cardiomyopathy, hypertensive (HCC) 09/18/2013   Edema 09/14/2013   Acute systolic heart failure (HCC) 09/09/2013   Hypertensive heart disease with CHF (congestive heart  failure) (HCC) 09/09/2013   Hypertension 09/07/2013   Type 2 diabetes mellitus with chronic kidney disease, without long-term current use of insulin (HCC) 09/07/2013   Obesity (BMI 30-39.9)    History of ovarian cancer    PCP:  Malka So., MD Pharmacy:   [facility] [phone] - [place], [place] - [address] [place] [address] Phone: [phone] Fax: [phone]  [facility] [address] [place] [address] Phone: [phone] Fax: [phone]

## 2023-02-23 NOTE — ED Provider Notes (Signed)
[place] EMERGENCY DEPARTMENT AT [facility] Provider Note   CSN: [id number] Arrival date & time: 02/22/23  2309     History  Chief Complaint  Patient presents with   Cramping    Teresa [place] is a 72 y.o. female.  HPI Patient presents for multiple complaints.  Medical history includes DM, HTN, HLD, CKD, CAD, CHF, ovarian cancer.  She initially arrived in the ED complaining of cramping in her hand.  On my assessment, she states that cramping has resolved.  She states that she has felt some mild chest congestion starting today.  Patient reports that she lives alone and does not have home health care.  She was in her normal state of health earlier today.  She went to the park where she says she was chatting with strangers and eating food.  It was there that she felt some congestion in her chest.  Per chart review, patient has history of dementia and was deemed to not have capacity at time of prior admission 1 month ago.  She does receive hospice care at home.  History per sister: Patient has hospice visit 1 time per week.  She does live on her own.  She drives.  Family has tried disconnecting her battery and removing her license plate and efforts to get her to not drive.  Despite this, patient continues to drive.  Sister states that patient does not get lost because she utilizes the cars navigation system to get her where she needs to go.    Home Medications Prior to Admission medications   Medication Sig Start Date End Date Taking? Authorizing Provider  amLODipine (NORVASC) 10 MG tablet Take 1 tablet (10 mg total) by mouth daily. 01/06/23   Corky Crafts, MD  aspirin EC 81 MG tablet Take 1 tablet (81 mg total) by mouth daily. Swallow whole. Patient taking differently: Take 81 mg by mouth at bedtime. Swallow whole. 11/14/22   Regalado, Belkys A, MD  azelastine (ASTELIN) 0.1 % nasal spray Place 2 sprays into both nostrils 2 (two) times daily. 12/02/22   [provider]  carvedilol (COREG) 12.5 MG tablet Take 1 tablet (12.5 mg total) by mouth 2 (two) times daily with a meal. 01/29/23   Burnadette Pop, MD  cetirizine (ZYRTEC) 10 MG tablet Take 10 mg by mouth daily as needed for allergies. 11/19/22   [provider]  cyanocobalamin 1000 MCG tablet Take 1 tablet (1,000 mcg total) by mouth daily. 11/14/22   Regalado, Belkys A, MD  glucose blood (ONE TOUCH ULTRA TEST) test strip Use to check blood sugar 3 times daily Dx code E11.65 Patient taking differently: 1 each by Other route See admin instructions. Use to check blood sugar 3 times daily Dx code E11.65 11/20/14   Reather Littler, MD  haloperidol (HALDOL) 1 MG tablet Take 1 tablet (1 mg total) by mouth every 3 (three) hours as needed for agitation. 01/29/23   Burnadette Pop, MD  ferrous sulfate 325 (65 FE) MG tablet Take 1 tablet (325 mg total) by mouth daily. 01/29/23   Burnadette Pop, MD  isosorbide mononitrate (IMDUR) 30 MG 24 hr tablet Take 1 tablet (30 mg total) by mouth daily. 01/30/23   Burnadette Pop, MD  OZEMPIC, 0.25 OR 0.5 MG/DOSE, 2 MG/3ML SOPN Inject 0.25 mg into the skin once a week. 12/20/22   [provider]  pantoprazole (PROTONIX) 20 MG tablet Take 1 tablet (20 mg total) by mouth 2 (two) times daily. Patient taking  differently: Take 40 mg by mouth every evening. 12/22/22 04/21/23  Dorcas Carrow, MD  patiromer (VELTASSA) 8.4 g packet Take 1 packet (8.4 g total) by mouth daily. 02/01/23   Russella Dar, NP  polyethylene glycol powder (GLYCOLAX/MIRALAX) 17 GM/SCOOP powder Take 17 g by mouth daily. 01/30/23   Burnadette Pop, MD  rosuvastatin (CRESTOR) 40 MG tablet Take 1 tablet (40 mg total) by mouth daily. 12/17/22   Sharlene Dory, PA-C  senna (SENOKOT) 8.6 MG TABS tablet Take 1 tablet (8.6 mg total) by mouth daily. 01/30/23   Burnadette Pop, MD  sucralfate (CARAFATE) 1 g tablet Take 1 tablet (1 g total) by mouth with breakfast, with lunch, and with evening meal for 7 days. 12/27/22  01/03/23  Tanda Rockers A, DO  thiamine (VITAMIN B-1) 100 MG tablet Take 1 tablet (100 mg total) by mouth daily. 11/14/22   Regalado, Belkys A, MD  iron polysaccharides (NIFEREX) 150 MG capsule Take 1 capsule (150 mg total) by mouth daily. Patient not taking: Reported on 12/22/2022 11/14/22 01/29/23  Hartley Barefoot A, MD      Allergies    Hydralazine hcl and Atorvastatin    Review of Systems   Review of Systems  HENT:  Positive for congestion.   All other systems reviewed and are negative.   Physical Exam Updated Vital Signs BP (!) 146/96   Pulse 82   Temp 98.1 F (36.7 C) (Oral)   Resp (!) 21   Ht 5\' 7"  (1.702 m)   Wt 77.6 kg   SpO2 97%   BMI 26.79 kg/m  Physical Exam Vitals and nursing note reviewed.  Constitutional:      General: She is not in acute distress.    Appearance: Normal appearance. She is well-developed. She is not ill-appearing, toxic-appearing or diaphoretic.  HENT:     Head: Normocephalic and atraumatic.     Right Ear: External ear normal.     Left Ear: External ear normal.     Nose: Nose normal.     Mouth/Throat:     Mouth: Mucous membranes are moist.  Eyes:     Extraocular Movements: Extraocular movements intact.     Conjunctiva/sclera: Conjunctivae normal.  Cardiovascular:     Rate and Rhythm: Normal rate and regular rhythm.  Pulmonary:     Effort: Pulmonary effort is normal. No respiratory distress.     Breath sounds: No wheezing or rales.  Abdominal:     General: There is no distension.     Palpations: Abdomen is soft.     Tenderness: There is no abdominal tenderness.  Musculoskeletal:        General: No swelling. Normal range of motion.     Cervical back: Normal range of motion and neck supple.     Right lower leg: Edema present.     Left lower leg: Edema present.  Skin:    General: Skin is warm and dry.  Neurological:     General: No focal deficit present.     Mental Status: She is alert. She is disoriented.  Psychiatric:        Mood  and Affect: Mood normal.        Behavior: Behavior normal.     ED Results / Procedures / Treatments   Labs (all labs ordered are listed, but only abnormal results are displayed) Labs Reviewed  COMPREHENSIVE METABOLIC PANEL - Abnormal; Notable for the following components:      Result Value   CO2 21 (*)  Glucose, Bld 112 (*)    BUN 62 (*)    Creatinine, Ser 4.69 (*)    Calcium 7.9 (*)    Albumin 3.0 (*)    GFR, Estimated 9 (*)    All other components within normal limits  BRAIN NATRIURETIC PEPTIDE - Abnormal; Notable for the following components:   B Natriuretic Peptide 122.4 (*)    All other components within normal limits  CBC WITH DIFFERENTIAL/PLATELET - Abnormal; Notable for the following components:   RBC 3.47 (*)    Hemoglobin 9.9 (*)    HCT 32.4 (*)    All other components within normal limits  RESP PANEL BY RT-PCR (RSV, FLU A&B, COVID)  [id number]    EKG EKG Interpretation Date/Time:  Tuesday February 23 2023 03:04:58 EDT Ventricular Rate:  65 PR Interval:  198 QRS Duration:  185 QT Interval:  480 QTC Calculation: 500 R Axis:   -39  Text Interpretation: Sinus rhythm Right bundle branch block Confirmed by Gloris Manchester [phone]) on 02/23/2023 3:42:15 AM  Radiology DG Chest Portable 1 View  Result Date: 02/23/2023 CLINICAL DATA:  Congestion EXAM: PORTABLE CHEST 1 VIEW COMPARISON:  01/21/2023 FINDINGS: Cardiac shadow is stable. Aortic calcifications are noted. Lungs are clear bilaterally. No bony abnormality is noted. IMPRESSION: No active disease. Electronically Signed   By: Alcide Clever M.D.   On: 02/23/2023 02:47    Procedures Procedures    Medications Ordered in ED Medications  amLODipine (NORVASC) tablet 10 mg (10 mg Oral Given 02/23/23 0300)  carvedilol (COREG) tablet 12.5 mg (12.5 mg Oral Given 02/23/23 0301)  isosorbide mononitrate (IMDUR) 24 hr tablet 30 mg (30 mg Oral Given 02/23/23 0302)  pantoprazole (PROTONIX) EC tablet 20 mg (20 mg Oral Not Given 02/23/23 0351)     ED Course/ Medical Decision Making/ A&P                             Medical Decision Making Amount and/or Complexity of Data Reviewed Labs: ordered. Radiology: ordered.  Risk Prescription drug management.   This patient presents to the ED for concern of multiple complaints, this involves an extensive number of treatment options, and is a complaint that carries with it a high risk of complications and morbidity.  The differential diagnosis includes dementia, electrolyte disturbances, medication nonadherence, URI, CHF exacerbation   Co morbidities that complicate the patient evaluation  DM, HTN, HLD, CKD, CAD, CHF, ovarian cancer   Additional history obtained:  Additional history obtained from patient's sister External records from outside source obtained and reviewed including EMR   Lab Tests:  I Ordered, and personally interpreted labs.  The pertinent results include: Creatinine is improved from baseline, BNP is improved from prior lab work, anemia is baseline.  No leukocytosis is present.   Imaging Studies ordered:  I ordered imaging studies including chest x-ray I independently visualized and interpreted imaging which showed no acute findings I agree with the radiologist interpretation   Cardiac Monitoring: / EKG:  The patient was maintained on a cardiac monitor.  I personally viewed and interpreted the cardiac monitored which showed an underlying rhythm of: Sinus rhythm   Consultations Obtained:  I requested consultation with the social worker,  and discussed lab and imaging findings as well as pertinent plan - they recommend: (Pending)   Problem List / ED Course / Critical interventions / Medication management  Patient presents with initial complaints of left hand cramping.  On my assessment, she  states that this has resolved.  She does state that she experienced some congestion in her chest starting this evening, while she was at the park.  Current  breathing is unlabored.  Lungs are clear to auscultation.  Patient is disoriented.  When asked the year, she states that it is 2026.  I reviewed EMR and it does seem that patient does have a history of cognitive impairment.  During prior hospitalization 1 month ago, plan was for hospice care at home with family support.  I spoke with her sister over the telephone and sister states that the patient does live alone.  Hospice comes by 1 time per week.  Given her history of CHF and complaints of chest congestion, will check chest x-ray and lab work.  Patient's current vital signs are notable for hypertension.  Her sister states that she does not take her medications at home.  Patient's home blood pressure medications were ordered.  Following medical evaluation, plan will be for [facility] consult in the [place].  Patient's lab work is reassuring.  Creatinine and BNP have improved.  Following home blood pressure medications today in the ED, patient's hypertension has improved.  Patient is now medically cleared.  Patient to remain in the ED awaiting social work consult. I ordered medication including amlodipine, carvedilol, Imdur for hypertension Reevaluation of the patient after these medicines showed that the patient improved I have reviewed the patients home medicines and have made adjustments as needed   Social Determinants of Health:  Has cognitive impairment, yet lives independently.  Has hospice care 1 day/week.        Final Clinical Impression(s) / ED Diagnoses Final diagnoses:  Confusion    Rx / DC Orders ED Discharge Orders     None         Gloris Manchester, MD 02/23/23 [phone]

## 2023-02-23 NOTE — Progress Notes (Addendum)
RN informed pt is stating she wants to leave. Orders are in. HH agency added to AVS. RN to print AVS so pt can be discharged. No further TOC needs.

## 2023-02-23 NOTE — ED Notes (Addendum)
Pt states she wants to leave.

## 2023-03-30 ENCOUNTER — Encounter (HOSPITAL_COMMUNITY): Payer: Self-pay

## 2023-03-30 ENCOUNTER — Emergency Department (HOSPITAL_COMMUNITY)
Admission: EM | Admit: 2023-03-30 | Discharge: 2023-03-31 | Disposition: A | Discharge status: Home / Self Care | Attending: Emergency Medicine | Admitting: Emergency Medicine

## 2023-03-30 ENCOUNTER — Emergency Department (HOSPITAL_COMMUNITY)

## 2023-03-30 ENCOUNTER — Other Ambulatory Visit: Payer: Self-pay

## 2023-03-30 DIAGNOSIS — K921 Melena: Secondary | ICD-10-CM | POA: Insufficient documentation

## 2023-03-30 DIAGNOSIS — E1122 Type 2 diabetes mellitus with diabetic chronic kidney disease: Secondary | ICD-10-CM | POA: Insufficient documentation

## 2023-03-30 DIAGNOSIS — N186 End stage renal disease: Secondary | ICD-10-CM | POA: Insufficient documentation

## 2023-03-30 DIAGNOSIS — I132 Hypertensive heart and chronic kidney disease with heart failure and with stage 5 chronic kidney disease, or end stage renal disease: Secondary | ICD-10-CM | POA: Diagnosis not present

## 2023-03-30 DIAGNOSIS — R079 Chest pain, unspecified: Secondary | ICD-10-CM | POA: Diagnosis present

## 2023-03-30 DIAGNOSIS — N179 Acute kidney failure, unspecified: Secondary | ICD-10-CM | POA: Diagnosis not present

## 2023-03-30 DIAGNOSIS — Z79899 Other long term (current) drug therapy: Secondary | ICD-10-CM | POA: Insufficient documentation

## 2023-03-30 DIAGNOSIS — N3000 Acute cystitis without hematuria: Secondary | ICD-10-CM | POA: Insufficient documentation

## 2023-03-30 DIAGNOSIS — K219 Gastro-esophageal reflux disease without esophagitis: Secondary | ICD-10-CM | POA: Insufficient documentation

## 2023-03-30 DIAGNOSIS — I509 Heart failure, unspecified: Secondary | ICD-10-CM | POA: Insufficient documentation

## 2023-03-30 DIAGNOSIS — Z7982 Long term (current) use of aspirin: Secondary | ICD-10-CM | POA: Insufficient documentation

## 2023-03-30 LAB — CBC
HCT: 33 % — ABNORMAL LOW (ref 36.0–46.0)
Hemoglobin: 10.3 g/dL — ABNORMAL LOW (ref 12.0–15.0)
MCH: 27.6 pg (ref 26.0–34.0)
MCHC: 31.2 g/dL (ref 30.0–36.0)
MCV: 88.5 fL (ref 80.0–100.0)
Platelets: 240 10*3/uL (ref 150–400)
RBC: 3.73 MIL/uL — ABNORMAL LOW (ref 3.87–5.11)
RDW: 14.3 % (ref 11.5–15.5)
WBC: 5.4 10*3/uL (ref 4.0–10.5)
nRBC: 0 % (ref 0.0–0.2)

## 2023-03-30 LAB — TROPONIN I (HIGH SENSITIVITY)
Troponin I (High Sensitivity): 29 ng/L — ABNORMAL HIGH (ref <18)
Troponin I (High Sensitivity): 27 ng/L — ABNORMAL HIGH (ref <18)

## 2023-03-30 LAB — URINALYSIS, ROUTINE W REFLEX MICROSCOPIC
Bilirubin Urine: NEGATIVE
Glucose, UA: 50 mg/dL — AB
Hgb urine dipstick: NEGATIVE
Ketones, ur: NEGATIVE mg/dL
Nitrite: NEGATIVE
Protein, ur: >=300 mg/dL — AB
Specific Gravity, Urine: 1.01 (ref 1.005–1.030)
pH: 6 (ref 5.0–8.0)

## 2023-03-30 LAB — COMPREHENSIVE METABOLIC PANEL
ALT: 11 U/L (ref 0–44)
AST: 18 U/L (ref 15–41)
Albumin: 2.9 g/dL — ABNORMAL LOW (ref 3.5–5.0)
Alkaline Phosphatase: 72 U/L (ref 38–126)
Anion gap: 14 (ref 5–15)
BUN: 72 mg/dL — ABNORMAL HIGH (ref 8–23)
CO2: 20 mmol/L — ABNORMAL LOW (ref 22–32)
Calcium: 8.3 mg/dL — ABNORMAL LOW (ref 8.9–10.3)
Chloride: 105 mmol/L (ref 98–111)
Creatinine, Ser: 5.74 mg/dL — ABNORMAL HIGH (ref 0.44–1.00)
GFR, Estimated: 7 mL/min — ABNORMAL LOW (ref >60)
Glucose, Bld: 148 mg/dL — ABNORMAL HIGH (ref 70–99)
Potassium: 4.6 mmol/L (ref 3.5–5.1)
Sodium: 139 mmol/L (ref 135–145)
Total Bilirubin: 0.5 mg/dL (ref 0.3–1.2)
Total Protein: 6.4 g/dL — ABNORMAL LOW (ref 6.5–8.1)

## 2023-03-30 LAB — PROTIME-INR
INR: 1 (ref 0.8–1.2)
Prothrombin Time: 13.4 seconds (ref 11.4–15.2)

## 2023-03-30 LAB — LIPASE, BLOOD: Lipase: 76 U/L — ABNORMAL HIGH (ref 11–51)

## 2023-03-30 LAB — APTT: aPTT: 27 seconds (ref 24–36)

## 2023-03-30 LAB — POC OCCULT BLOOD, ED: Fecal Occult Bld: NEGATIVE

## 2023-03-30 MED ORDER — SODIUM CHLORIDE 0.9 % IV BOLUS
500.0000 mL | Freq: Once | INTRAVENOUS | Status: AC
  Administered 2023-03-30: 500 mL via INTRAVENOUS

## 2023-03-30 MED ORDER — PANTOPRAZOLE SODIUM 40 MG IV SOLR
40.0000 mg | Freq: Once | INTRAVENOUS | Status: AC
  Administered 2023-03-30: 40 mg via INTRAVENOUS
  Filled 2023-03-30: qty 10

## 2023-03-30 MED ORDER — CEPHALEXIN 500 MG PO CAPS: 500.0000 mg | ORAL_CAPSULE | Freq: Four times a day (QID) | ORAL | 0 refills | Status: DC

## 2023-03-30 MED ORDER — LIDOCAINE VISCOUS HCL 2 % MT SOLN
15.0000 mL | Freq: Once | OROMUCOSAL | Status: AC
  Administered 2023-03-30: 15 mL via ORAL
  Filled 2023-03-30: qty 15

## 2023-03-30 MED ORDER — ALUM & MAG HYDROXIDE-SIMETH 200-200-20 MG/5ML PO SUSP
30.0000 mL | Freq: Once | ORAL | Status: AC
  Administered 2023-03-30: 30 mL via ORAL
  Filled 2023-03-30: qty 30

## 2023-03-30 MED ORDER — CEPHALEXIN 250 MG PO CAPS
500.0000 mg | ORAL_CAPSULE | Freq: Once | ORAL | Status: AC
  Administered 2023-03-30: 500 mg via ORAL
  Filled 2023-03-30: qty 2

## 2023-03-30 NOTE — ED Provider Notes (Signed)
[place] EMERGENCY DEPARTMENT AT [facility] Provider Note   CSN: [id number] Arrival date & time: 03/30/23  1830     History  Chief Complaint  Patient presents with   Chest Pain   Melena    Teresa Poole is a 72 y.o. female.  Patient is a 72 year old female with a past medical history of CKD stage V, CHF, hypertension, diabetes senting to the emergency department with burning type of pain as well as black stools.  The patient states for about the last week her stools have appeared black.  She states that 2 days ago she started to develop a burning type of pain in her throat and into her stomach.  She states that it feels like she is "being burned with pepper".  She states that her eyes have also been burning and feeling watery.  She denies any vision changes or itching.  She states that she had some chest pressure that started last night.  She denies any shortness of breath.  She denies any current abdominal pain.  She denies any blood thinner use.  Of note she did have a recent endoscopy in April that was found to have Barrett's esophagus.  Patient also reports that she got up to pee 8 times in the night last night.  She also reports some associated dysuria.  She denies any hematuria.  The history is provided by the patient.  Chest Pain      Home Medications Prior to Admission medications   Medication Sig Start Date End Date Taking? Authorizing Provider  cephALEXin (KEFLEX) 500 MG capsule Take 1 capsule (500 mg total) by mouth 4 (four) times daily. 03/30/23  Yes Theresia Lo, [place] K, DO  amLODipine (NORVASC) 10 MG tablet Take 1 tablet (10 mg total) by mouth daily. 01/06/23   Corky Crafts, MD  aspirin EC 81 MG tablet Take 1 tablet (81 mg total) by mouth daily. Swallow whole. Patient taking differently: Take 81 mg by mouth at bedtime. Swallow whole. 11/14/22   Regalado, Belkys A, MD  azelastine (ASTELIN) 0.1 % nasal spray Place 2 sprays into both nostrils 2 (two)  times daily. 12/02/22   [provider]  carvedilol (COREG) 12.5 MG tablet Take 1 tablet (12.5 mg total) by mouth 2 (two) times daily with a meal. 01/29/23   Burnadette Pop, MD  cetirizine (ZYRTEC) 10 MG tablet Take 10 mg by mouth daily as needed for allergies. 11/19/22   [provider]  cyanocobalamin 1000 MCG tablet Take 1 tablet (1,000 mcg total) by mouth daily. 11/14/22   Regalado, Belkys A, MD  glucose blood (ONE TOUCH ULTRA TEST) test strip Use to check blood sugar 3 times daily Dx code E11.65 Patient taking differently: 1 each by Other route See admin instructions. Use to check blood sugar 3 times daily Dx code E11.65 11/20/14   Reather Littler, MD  haloperidol (HALDOL) 1 MG tablet Take 1 tablet (1 mg total) by mouth every 3 (three) hours as needed for agitation. Patient not taking: Reported on 02/23/2023 01/29/23   Burnadette Pop, MD  ferrous sulfate 325 (65 FE) MG tablet Take 1 tablet (325 mg total) by mouth daily. Patient not taking: Reported on 02/23/2023 01/29/23   Burnadette Pop, MD  isosorbide mononitrate (IMDUR) 30 MG 24 hr tablet Take 1 tablet (30 mg total) by mouth daily. 01/30/23   Burnadette Pop, MD  OZEMPIC, 0.25 OR 0.5 MG/DOSE, 2 MG/3ML SOPN Inject 0.25 mg into the skin once a week. 12/20/22  [provider]  pantoprazole (PROTONIX) 20 MG tablet Take 1 tablet (20 mg total) by mouth 2 (two) times daily. Patient taking differently: Take 40 mg by mouth every evening. 12/22/22 04/21/23  Dorcas Carrow, MD  patiromer (VELTASSA) 8.4 g packet Take 1 packet (8.4 g total) by mouth daily. Patient not taking: Reported on 02/23/2023 02/01/23   Russella Dar, NP  polyethylene glycol powder (GLYCOLAX/MIRALAX) 17 GM/SCOOP powder Take 17 g by mouth daily. 01/30/23   Burnadette Pop, MD  rosuvastatin (CRESTOR) 40 MG tablet Take 1 tablet (40 mg total) by mouth daily. 12/17/22   Sharlene Dory, PA-C  senna (SENOKOT) 8.6 MG TABS tablet Take 1 tablet (8.6 mg total) by mouth daily. 01/30/23    Burnadette Pop, MD  sucralfate (CARAFATE) 1 g tablet Take 1 tablet (1 g total) by mouth with breakfast, with lunch, and with evening meal for 7 days. 12/27/22 01/03/23  Tanda Rockers A, DO  thiamine (VITAMIN B-1) 100 MG tablet Take 1 tablet (100 mg total) by mouth daily. 11/14/22   Regalado, Belkys A, MD  iron polysaccharides (NIFEREX) 150 MG capsule Take 1 capsule (150 mg total) by mouth daily. Patient not taking: Reported on 12/22/2022 11/14/22 01/29/23  Hartley Barefoot A, MD      Allergies    Hydralazine hcl and Atorvastatin    Review of Systems   Review of Systems  Cardiovascular:  Positive for chest pain.    Physical Exam Updated Vital Signs BP (!) 149/58   Pulse 70   Temp 98.1 F (36.7 C) (Oral)   Resp 15   SpO2 100%  Physical Exam Vitals and nursing note reviewed.  Constitutional:      General: She is not in acute distress.    Appearance: She is well-developed.  HENT:     Head: Normocephalic and atraumatic.     Mouth/Throat:     Mouth: Mucous membranes are moist. No oral lesions.     Pharynx: Oropharynx is clear.  Eyes:     Extraocular Movements: Extraocular movements intact.     Pupils: Pupils are equal, round, and reactive to light.  Cardiovascular:     Rate and Rhythm: Normal rate and regular rhythm.     Heart sounds: Normal heart sounds.  Pulmonary:     Effort: Pulmonary effort is normal.     Breath sounds: Normal breath sounds.  Chest:     Chest wall: No tenderness.  Abdominal:     General: Abdomen is flat.     Palpations: Abdomen is soft.     Tenderness: There is no abdominal tenderness.     Comments: On rectal exam, black appearing stool. No hemorrhoids or fissures  Musculoskeletal:        General: Normal range of motion.     Cervical back: Normal range of motion and neck supple.     Right lower leg: No edema.     Left lower leg: No edema.  Skin:    General: Skin is warm and dry.  Neurological:     General: No focal deficit present.     Mental Status:  She is alert and oriented to person, place, and time.     ED Results / Procedures / Treatments   Labs (all labs ordered are listed, but only abnormal results are displayed) Labs Reviewed  CBC - Abnormal; Notable for the following components:      Result Value   RBC 3.73 (*)    Hemoglobin 10.3 (*)  HCT 33.0 (*)    All other components within normal limits  LIPASE, BLOOD - Abnormal; Notable for the following components:   Lipase 76 (*)    All other components within normal limits  COMPREHENSIVE METABOLIC PANEL - Abnormal; Notable for the following components:   CO2 20 (*)    Glucose, Bld 148 (*)    BUN 72 (*)    Creatinine, Ser 5.74 (*)    Calcium 8.3 (*)    Total Protein 6.4 (*)    Albumin 2.9 (*)    GFR, Estimated 7 (*)    All other components within normal limits  URINALYSIS, ROUTINE W REFLEX MICROSCOPIC - Abnormal; Notable for the following components:   Color, Urine STRAW (*)    Glucose, UA 50 (*)    Protein, ur >=300 (*)    Leukocytes,Ua TRACE (*)    Bacteria, UA RARE (*)    All other components within normal limits  TROPONIN I (HIGH SENSITIVITY) - Abnormal; Notable for the following components:   Troponin I (High Sensitivity) 29 (*)    All other components within normal limits  TROPONIN I (HIGH SENSITIVITY) - Abnormal; Notable for the following components:   Troponin I (High Sensitivity) 27 (*)    All other components within normal limits  APTT  PROTIME-INR  POC OCCULT BLOOD, ED    EKG EKG Interpretation Date/Time:  Tuesday March 30 2023 18:41:59 EDT Ventricular Rate:  77 PR Interval:  190 QRS Duration:  148 QT Interval:  464 QTC Calculation: 525 R Axis:   -23  Text Interpretation: Normal sinus rhythm Right bundle branch block Abnormal ECG No significant change since last tracing Confirmed by Elayne Snare ([id number]) on 03/30/2023 9:37:27 PM  Radiology DG Chest 1 View  Result Date: 03/30/2023 CLINICAL DATA:  Chest pain EXAM: CHEST  1 VIEW COMPARISON:   02/23/2023 FINDINGS: Heart and mediastinal contours are within normal limits. No focal opacities or effusions. No acute bony abnormality. Aortic atherosclerosis. IMPRESSION: No active disease. Electronically Signed   By: Charlett Nose M.D.   On: 03/30/2023 19:28    Procedures Procedures    Medications Ordered in ED Medications  sodium chloride 0.9 % bolus 500 mL (has no administration in time range)  cephALEXin (KEFLEX) capsule 500 mg (has no administration in time range)  pantoprazole (PROTONIX) injection 40 mg (40 mg Intravenous Given 03/30/23 2216)  alum & mag hydroxide-simeth (MAALOX/MYLANTA) 200-200-20 MG/5ML suspension 30 mL (30 mLs Oral Given 03/30/23 2215)    And  lidocaine (XYLOCAINE) 2 % viscous mouth solution 15 mL (15 mLs Oral Given 03/30/23 2215)    ED Course/ Medical Decision Making/ A&P Clinical Course as of 03/30/23 2306  Tue Mar 30, 2023  2238 Rare bacteria and trace leuks on UA. With symptoms concerning for possible UTI. Hemoccult is negative. [VK]  2246 Repeat troponin is flat. Patient's symptoms have resolved. Patient is stable for discharge home with outpatient follow up. [VK]    Clinical Course User Index [VK] Rexford Maus, DO                                 Medical Decision Making This patient presents to the ED with chief complaint(s) of chest pain, black stool with pertinent past medical history of CKD, CHF, HTN, DM, Barrett's esophagus which further complicates the presenting complaint. The complaint involves an extensive differential diagnosis and also carries with it a high risk of complications  and morbidity.    The differential diagnosis includes ACS, arrhythmia, gastritis, GERD, pancreatitis, hepatitis, GI bleed, PUD, UTI  Additional history obtained: Additional history obtained from n/a Records reviewed previous admission documents  ED Course and Reassessment: On patient's arrival she is hemodynamically stable in no acute distress.  She was  initially evaluated by triage and had EKG, chest x-ray and labs performed.  EKG showed normal sinus rhythm without acute ischemic changes.  Initial troponin was mildly elevated at 28 and will have repeat.  Patient's labs showed a mild increase of her creatinine from her baseline and she will be given gentle fluids.  She had a mildly elevated lipase but no signs of pancreatitis.  Her hemoglobin is improved from her baseline.  The patient will have Hemoccult performed, repeat troponin and urinalysis.  She will be given a GI cocktail and will be closely reassessed.  Independent labs interpretation:  The following labs were independently interpreted: Mildly elevated troponin flat, Hemoccult negative stool, mild increase of creatinine  Independent visualization of imaging: - I independently visualized the following imaging with scope of interpretation limited to determining acute life threatening conditions related to emergency care: Chest x-ray, which revealed no acute disease  Consultation: - Consulted or discussed management/test interpretation w/ external professional: N/A  Consideration for admission or further workup: Patient has no emergent conditions requiring admission or further work-up at this time and is stable for discharge home with primary care follow-up  Social Determinants of health: N/A    Amount and/or Complexity of Data Reviewed Labs: ordered.  Risk OTC drugs. Prescription drug management.          Final Clinical Impression(s) / ED Diagnoses Final diagnoses:  Gastroesophageal reflux disease, unspecified whether esophagitis present  Acute cystitis without hematuria  Black stool  AKI (acute kidney injury) (HCC)    Rx / DC Orders ED Discharge Orders          Ordered    cephALEXin (KEFLEX) 500 MG capsule  4 times daily        03/30/23 2304              Rexford Maus, DO 03/30/23 2306

## 2023-03-30 NOTE — ED Triage Notes (Signed)
Pt bib ems for chest burning, pt c.o pain from her esophagus down to her rectum for the past 4 hours. Pt also c.o hard, black stools for the past month. Pt had EGD done in may and was found to have Barretts esophagus. Pt a.o, nad noted

## 2023-03-30 NOTE — ED Notes (Signed)
To X-ray

## 2023-03-30 NOTE — Discharge Instructions (Addendum)
You were seen in the emergency department for your chest pain and your dark stools.  Your workup showed no signs of blood in your stool or anemia and it may be dark from the iron pills that you are on.  You had no signs of heart attack or stress on your heart and your symptoms might be due to your acid reflux.  You should make sure that you are taking your antacid every day as prescribed and you can take over-the-counter Maalox or Tums as needed for additional pain.  Your workup also showed that you have a urinary tract infection which we have given you antibiotics for.  Your kidney function was slightly worse than your usual and you should have this followed up with your primary doctor.  You should return to the emergency department if you have significantly worsening pain, you have bloody stools or start vomiting blood, you have fevers despite the antibiotics or if you have any other new or concerning symptoms.

## 2023-03-30 NOTE — ED Provider Triage Note (Signed)
Emergency Medicine Provider Triage Evaluation Note  Teresa Poole , a 72 y.o. female  was evaluated in triage.  Pt complains of chest pain and melena. Has dark stools x several weeks. Denies BRBPR. Notes tinnutis to left ear. Notes swooshing sensation and heavy sensation to chest wall. Notes burning sensation to abdomen. Denies shortness of breath. Denies abdominal pain, nausea, vomiting, hematuria. No thinners  Review of Systems  Positive:  Negative:   Physical Exam  BP (!) 185/77   Pulse 78   Temp 98 F (36.7 C)   Resp 14   SpO2 100%  Gen:   Awake, no distress   Resp:  Normal effort  MSK:   Moves extremities without difficulty  Other:  No abdominal or chest wall TTP.   Medical Decision Making  Medically screening exam initiated at 6:49 PM.  Appropriate orders placed.  Teresa Poole was informed that the remainder of the evaluation will be completed by another provider, this initial triage assessment does not replace that evaluation, and the importance of remaining in the ED until their evaluation is complete.  7:01 PM - Discussed with RN that patient is in need of a room. RN aware and working on room placement.    ,  A, PA-C 03/30/23 1901

## 2023-04-06 ENCOUNTER — Emergency Department (HOSPITAL_COMMUNITY)

## 2023-04-06 ENCOUNTER — Encounter (HOSPITAL_COMMUNITY): Payer: Self-pay

## 2023-04-06 ENCOUNTER — Emergency Department (HOSPITAL_COMMUNITY)
Admission: EM | Admit: 2023-04-06 | Discharge: 2023-04-06 | Discharge status: Against Medical Advice | Attending: Emergency Medicine | Admitting: Emergency Medicine

## 2023-04-06 ENCOUNTER — Other Ambulatory Visit: Payer: Self-pay

## 2023-04-06 DIAGNOSIS — E1122 Type 2 diabetes mellitus with diabetic chronic kidney disease: Secondary | ICD-10-CM | POA: Diagnosis not present

## 2023-04-06 DIAGNOSIS — Z79899 Other long term (current) drug therapy: Secondary | ICD-10-CM | POA: Diagnosis not present

## 2023-04-06 DIAGNOSIS — Z7982 Long term (current) use of aspirin: Secondary | ICD-10-CM | POA: Insufficient documentation

## 2023-04-06 DIAGNOSIS — Z5329 Procedure and treatment not carried out because of patient's decision for other reasons: Secondary | ICD-10-CM | POA: Insufficient documentation

## 2023-04-06 DIAGNOSIS — I509 Heart failure, unspecified: Secondary | ICD-10-CM | POA: Diagnosis not present

## 2023-04-06 DIAGNOSIS — N184 Chronic kidney disease, stage 4 (severe): Secondary | ICD-10-CM | POA: Insufficient documentation

## 2023-04-06 DIAGNOSIS — R079 Chest pain, unspecified: Secondary | ICD-10-CM | POA: Insufficient documentation

## 2023-04-06 LAB — COMPREHENSIVE METABOLIC PANEL
ALT: 10 U/L (ref 0–44)
AST: 16 U/L (ref 15–41)
Albumin: 3 g/dL — ABNORMAL LOW (ref 3.5–5.0)
Alkaline Phosphatase: 56 U/L (ref 38–126)
Anion gap: 14 (ref 5–15)
BUN: 73 mg/dL — ABNORMAL HIGH (ref 8–23)
CO2: 21 mmol/L — ABNORMAL LOW (ref 22–32)
Calcium: 8.5 mg/dL — ABNORMAL LOW (ref 8.9–10.3)
Chloride: 106 mmol/L (ref 98–111)
Creatinine, Ser: 6.53 mg/dL — ABNORMAL HIGH (ref 0.44–1.00)
GFR, Estimated: 6 mL/min — ABNORMAL LOW (ref >60)
Glucose, Bld: 222 mg/dL — ABNORMAL HIGH (ref 70–99)
Potassium: 4.5 mmol/L (ref 3.5–5.1)
Sodium: 141 mmol/L (ref 135–145)
Total Bilirubin: 0.3 mg/dL (ref 0.3–1.2)
Total Protein: 6.3 g/dL — ABNORMAL LOW (ref 6.5–8.1)

## 2023-04-06 LAB — TROPONIN I (HIGH SENSITIVITY)
Troponin I (High Sensitivity): 26 ng/L — ABNORMAL HIGH (ref <18)
Troponin I (High Sensitivity): 26 ng/L — ABNORMAL HIGH (ref <18)

## 2023-04-06 LAB — CBC
HCT: 34.2 % — ABNORMAL LOW (ref 36.0–46.0)
Hemoglobin: 10.6 g/dL — ABNORMAL LOW (ref 12.0–15.0)
MCH: 27.4 pg (ref 26.0–34.0)
MCHC: 31 g/dL (ref 30.0–36.0)
MCV: 88.4 fL (ref 80.0–100.0)
Platelets: 224 10*3/uL (ref 150–400)
RBC: 3.87 MIL/uL (ref 3.87–5.11)
RDW: 14.3 % (ref 11.5–15.5)
WBC: 5.3 10*3/uL (ref 4.0–10.5)
nRBC: 0 % (ref 0.0–0.2)

## 2023-04-06 LAB — LIPASE, BLOOD: Lipase: 80 U/L — ABNORMAL HIGH (ref 11–51)

## 2023-04-06 MED ORDER — ALUM & MAG HYDROXIDE-SIMETH 200-200-20 MG/5ML PO SUSP
30.0000 mL | Freq: Once | ORAL | Status: DC
  Filled 2023-04-06: qty 30

## 2023-04-06 NOTE — ED Provider Notes (Signed)
[place] EMERGENCY DEPARTMENT AT [facility] Provider Note   CSN: [id number] Arrival date & time: 04/06/23  1600     History  Chief Complaint  Patient presents with   Chest Pain    Teresa Poole is a 72 y.o. female with a history of heart failure, stage IV CKD, type 2 diabetes, GERD, and mild cognitive impairment who presents to the ED today for acid reflux.  Patient reports that she was out running errands when she developed pain localized underneath her left breast.     Home Medications Prior to Admission medications   Medication Sig Start Date End Date Taking? Authorizing Provider  amLODipine (NORVASC) 10 MG tablet Take 1 tablet (10 mg total) by mouth daily. 01/06/23   Corky Crafts, MD  aspirin EC 81 MG tablet Take 1 tablet (81 mg total) by mouth daily. Swallow whole. Patient taking differently: Take 81 mg by mouth at bedtime. Swallow whole. 11/14/22   Regalado, Belkys A, MD  azelastine (ASTELIN) 0.1 % nasal spray Place 2 sprays into both nostrils 2 (two) times daily. 12/02/22   [provider]  carvedilol (COREG) 12.5 MG tablet Take 1 tablet (12.5 mg total) by mouth 2 (two) times daily with a meal. 01/29/23   Burnadette Pop, MD  cephALEXin (KEFLEX) 500 MG capsule Take 1 capsule (500 mg total) by mouth 4 (four) times daily. 03/30/23   Elayne Snare K, DO  cetirizine (ZYRTEC) 10 MG tablet Take 10 mg by mouth daily as needed for allergies. 11/19/22   [provider]  cyanocobalamin 1000 MCG tablet Take 1 tablet (1,000 mcg total) by mouth daily. 11/14/22   Regalado, Belkys A, MD  glucose blood (ONE TOUCH ULTRA TEST) test strip Use to check blood sugar 3 times daily Dx code E11.65 Patient taking differently: 1 each by Other route See admin instructions. Use to check blood sugar 3 times daily Dx code E11.65 11/20/14   Reather Littler, MD  haloperidol (HALDOL) 1 MG tablet Take 1 tablet (1 mg total) by mouth every 3 (three) hours as needed for  agitation. Patient not taking: Reported on 02/23/2023 01/29/23   Burnadette Pop, MD  ferrous sulfate 325 (65 FE) MG tablet Take 1 tablet (325 mg total) by mouth daily. Patient not taking: Reported on 02/23/2023 01/29/23   Burnadette Pop, MD  isosorbide mononitrate (IMDUR) 30 MG 24 hr tablet Take 1 tablet (30 mg total) by mouth daily. 01/30/23   Burnadette Pop, MD  OZEMPIC, 0.25 OR 0.5 MG/DOSE, 2 MG/3ML SOPN Inject 0.25 mg into the skin once a week. 12/20/22   [provider]  pantoprazole (PROTONIX) 20 MG tablet Take 1 tablet (20 mg total) by mouth 2 (two) times daily. Patient taking differently: Take 40 mg by mouth every evening. 12/22/22 04/21/23  Dorcas Carrow, MD  patiromer (VELTASSA) 8.4 g packet Take 1 packet (8.4 g total) by mouth daily. Patient not taking: Reported on 02/23/2023 02/01/23   Russella Dar, NP  polyethylene glycol powder (GLYCOLAX/MIRALAX) 17 GM/SCOOP powder Take 17 g by mouth daily. 01/30/23   Burnadette Pop, MD  rosuvastatin (CRESTOR) 40 MG tablet Take 1 tablet (40 mg total) by mouth daily. 12/17/22   Sharlene Dory, PA-C  senna (SENOKOT) 8.6 MG TABS tablet Take 1 tablet (8.6 mg total) by mouth daily. 01/30/23   Burnadette Pop, MD  sucralfate (CARAFATE) 1 g tablet Take 1 tablet (1 g total) by mouth with breakfast, with lunch, and with evening meal for 7 days.  12/27/22 01/03/23  Tanda Rockers A, DO  thiamine (VITAMIN B-1) 100 MG tablet Take 1 tablet (100 mg total) by mouth daily. 11/14/22   Regalado, Belkys A, MD  iron polysaccharides (NIFEREX) 150 MG capsule Take 1 capsule (150 mg total) by mouth daily. Patient not taking: Reported on 12/22/2022 11/14/22 01/29/23  Hartley Barefoot A, MD      Allergies    Hydralazine hcl and Atorvastatin    Review of Systems   Review of Systems  Respiratory:  Positive for chest tightness.   All other systems reviewed and are negative.   Physical Exam Updated Vital Signs BP (!) 147/76   Pulse 75   Temp 98 F (36.7 C) (Oral)   Resp 16   Ht  5\' 7"  (1.702 m)   Wt 78 kg   SpO2 100%   BMI 26.93 kg/m  Physical Exam Vitals and nursing note reviewed.  Constitutional:      General: She is not in acute distress.    Appearance: Normal appearance.  HENT:     Head: Normocephalic and atraumatic.     Mouth/Throat:     Mouth: Mucous membranes are moist.  Eyes:     Conjunctiva/sclera: Conjunctivae normal.     Pupils: Pupils are equal, round, and reactive to light.  Cardiovascular:     Rate and Rhythm: Normal rate and regular rhythm.     Pulses: Normal pulses.     Heart sounds: Normal heart sounds.  Pulmonary:     Effort: Pulmonary effort is normal.     Breath sounds: Normal breath sounds.  Abdominal:     Palpations: Abdomen is soft.     Tenderness: There is no abdominal tenderness. There is no right CVA tenderness or left CVA tenderness.  Skin:    General: Skin is warm and dry.     Findings: No rash.  Neurological:     General: No focal deficit present.     Mental Status: She is alert.     Sensory: No sensory deficit.     Motor: No weakness.  Psychiatric:        Mood and Affect: Mood normal.        Behavior: Behavior normal.     ED Results / Procedures / Treatments   Labs (all labs ordered are listed, but only abnormal results are displayed) Labs Reviewed  LIPASE, BLOOD - Abnormal; Notable for the following components:      Result Value   Lipase 80 (*)    All other components within normal limits  CBC - Abnormal; Notable for the following components:   Hemoglobin 10.6 (*)    HCT 34.2 (*)    All other components within normal limits  COMPREHENSIVE METABOLIC PANEL - Abnormal; Notable for the following components:   CO2 21 (*)    Glucose, Bld 222 (*)    BUN 73 (*)    Creatinine, Ser 6.53 (*)    Calcium 8.5 (*)    Total Protein 6.3 (*)    Albumin 3.0 (*)    GFR, Estimated 6 (*)    All other components within normal limits  TROPONIN I (HIGH SENSITIVITY) - Abnormal; Notable for the following components:    Troponin I (High Sensitivity) 26 (*)    All other components within normal limits  TROPONIN I (HIGH SENSITIVITY)    EKG EKG Interpretation Date/Time:  Tuesday April 06 2023 19:24:22 EDT Ventricular Rate:  75 PR Interval:  199 QRS Duration:  163 QT Interval:  469  QTC Calculation: 524 R Axis:   -23  Text Interpretation: Sinus rhythm Right bundle branch block Non-specific ST-t changes Confirmed by Cathren Laine ([id number]) on 04/06/2023 7:28:44 PM  Radiology DG Chest Portable 1 View  Result Date: 04/06/2023 CLINICAL DATA:  Chest pressure. EXAM: PORTABLE CHEST 1 VIEW COMPARISON:  Chest radiograph dated 03/30/2023. FINDINGS: No focal consolidation, pleural effusion, or pneumothorax. The cardiac silhouette is within normal limits. Atherosclerotic calcification of the aorta. No acute osseous pathology. IMPRESSION: No active disease. Electronically Signed   By: Elgie Collard M.D.   On: 04/06/2023 19:22    Procedures Procedures: not indicated.   Medications Ordered in ED Medications  alum & mag hydroxide-simeth (MAALOX/MYLANTA) 200-200-20 MG/5ML suspension 30 mL (30 mLs Oral Not Given 04/06/23 2004)    ED Course/ Medical Decision Making/ A&P                                 Medical Decision Making Amount and/or Complexity of Data Reviewed Labs: ordered. Radiology: ordered.  Risk OTC drugs.   This patient presents to the ED for concern of chest burning sensation, this involves an extensive number of treatment options, and is a complaint that carries with it a high risk of complications and morbidity.   Differential diagnosis includes: ACS, pneumonia, pneumothorax, pleurisy, costochondritis, GERD, etc.   Comorbidities  See HPI above   Additional History  Additional history obtained from patient's previous records.   Cardiac Monitoring / EKG  The patient was maintained on a cardiac monitor.  I personally viewed and interpreted the cardiac monitored which showed: Sinus  rhythm with a right bundle branch block, heart rate of 75 bpm.   Lab Tests  I ordered and personally interpreted labs.  The pertinent results include:   Initial troponin of 26.  Second troponin was drawn but results pending at the time patient eloped. CBC is within normal limits - no acute anemia or infection CMP is within normal limits for patient - no acute anemia. Lipase of 80.   Imaging Studies  I ordered imaging studies including CXR  I independently visualized and interpreted imaging which showed: no active cardiopulmonary disease I agree with the radiologist interpretation   Problem List / ED Course / Critical Interventions / Medication Management  Chest burning sensation I ordered medications including: Maalox for burning sensation - patient declined medication. On reevaluation, patient seemed forgetful. She did not remember why she came to the ED tonight. I have reviewed the patients home medicines and have made adjustments as needed   Social Determinants of Health  Physical activity   Test / Admission - Considered  Patient eloped.  Second troponin was drawn but results were pending.        Final Clinical Impression(s) / ED Diagnoses Final diagnoses:  Nonspecific chest pain    Rx / DC Orders ED Discharge Orders     None         Maxwell Marion, PA-C 04/06/23 2219    Cathren Laine, MD 04/06/23 2349

## 2023-04-06 NOTE — ED Notes (Addendum)
Seen by PA, informed of elevated troponin, still refusing to wear cardiac monitoring. Agreed to have 2nd trop drawn. Pt is A+O x 4, however is forgetful.

## 2023-04-06 NOTE — ED Triage Notes (Signed)
Pt came in via POV d/t acid reflux causing her chest to burn.  A/Ox4, rates 10/10, Hx of GERD, denies taking any meds for this OTC at home.

## 2023-04-06 NOTE — ED Notes (Signed)
Pt eloped from ED. Last I spoke with pt, she was A+Ox4 however she was forgetful.

## 2023-04-06 NOTE — ED Notes (Signed)
Pt removed all monitoring equipment and refuses to replace it. Pt insistant on leaving. Agreed to stay for blood results.

## 2023-04-19 ENCOUNTER — Other Ambulatory Visit: Payer: Self-pay

## 2023-04-19 ENCOUNTER — Encounter (HOSPITAL_COMMUNITY): Payer: Self-pay | Admitting: Emergency Medicine

## 2023-04-19 ENCOUNTER — Emergency Department (HOSPITAL_COMMUNITY)
Admission: EM | Admit: 2023-04-19 | Discharge: 2023-04-19 | Disposition: A | Discharge status: Home / Self Care | Attending: Emergency Medicine | Admitting: Emergency Medicine

## 2023-04-19 DIAGNOSIS — I130 Hypertensive heart and chronic kidney disease with heart failure and stage 1 through stage 4 chronic kidney disease, or unspecified chronic kidney disease: Secondary | ICD-10-CM | POA: Insufficient documentation

## 2023-04-19 DIAGNOSIS — N189 Chronic kidney disease, unspecified: Secondary | ICD-10-CM | POA: Insufficient documentation

## 2023-04-19 DIAGNOSIS — I5040 Unspecified combined systolic (congestive) and diastolic (congestive) heart failure: Secondary | ICD-10-CM | POA: Diagnosis not present

## 2023-04-19 DIAGNOSIS — R4182 Altered mental status, unspecified: Secondary | ICD-10-CM | POA: Diagnosis present

## 2023-04-19 DIAGNOSIS — K30 Functional dyspepsia: Secondary | ICD-10-CM

## 2023-04-19 DIAGNOSIS — E1122 Type 2 diabetes mellitus with diabetic chronic kidney disease: Secondary | ICD-10-CM | POA: Insufficient documentation

## 2023-04-19 DIAGNOSIS — Z7982 Long term (current) use of aspirin: Secondary | ICD-10-CM | POA: Diagnosis not present

## 2023-04-19 DIAGNOSIS — F03A11 Unspecified dementia, mild, with agitation: Secondary | ICD-10-CM

## 2023-04-19 DIAGNOSIS — F0390 Unspecified dementia without behavioral disturbance: Secondary | ICD-10-CM | POA: Diagnosis not present

## 2023-04-19 LAB — CBC
HCT: 31.5 % — ABNORMAL LOW (ref 36.0–46.0)
Hemoglobin: 10 g/dL — ABNORMAL LOW (ref 12.0–15.0)
MCH: 28.3 pg (ref 26.0–34.0)
MCHC: 31.7 g/dL (ref 30.0–36.0)
MCV: 89.2 fL (ref 80.0–100.0)
Platelets: 233 10*3/uL (ref 150–400)
RBC: 3.53 MIL/uL — ABNORMAL LOW (ref 3.87–5.11)
RDW: 14.1 % (ref 11.5–15.5)
WBC: 5.1 10*3/uL (ref 4.0–10.5)
nRBC: 0 % (ref 0.0–0.2)

## 2023-04-19 LAB — COMPREHENSIVE METABOLIC PANEL
ALT: 9 U/L (ref 0–44)
AST: 17 U/L (ref 15–41)
Albumin: 3.2 g/dL — ABNORMAL LOW (ref 3.5–5.0)
Alkaline Phosphatase: 48 U/L (ref 38–126)
Anion gap: 13 (ref 5–15)
BUN: 64 mg/dL — ABNORMAL HIGH (ref 8–23)
CO2: 22 mmol/L (ref 22–32)
Calcium: 8.1 mg/dL — ABNORMAL LOW (ref 8.9–10.3)
Chloride: 99 mmol/L (ref 98–111)
Creatinine, Ser: 5.91 mg/dL — ABNORMAL HIGH (ref 0.44–1.00)
GFR, Estimated: 7 mL/min — ABNORMAL LOW (ref >60)
Glucose, Bld: 118 mg/dL — ABNORMAL HIGH (ref 70–99)
Potassium: 3.9 mmol/L (ref 3.5–5.1)
Sodium: 134 mmol/L — ABNORMAL LOW (ref 135–145)
Total Bilirubin: 0.4 mg/dL (ref 0.3–1.2)
Total Protein: 6.7 g/dL (ref 6.5–8.1)

## 2023-04-19 LAB — LIPASE, BLOOD: Lipase: 59 U/L — ABNORMAL HIGH (ref 11–51)

## 2023-04-19 MED ORDER — ALUM & MAG HYDROXIDE-SIMETH 200-200-20 MG/5ML PO SUSP
30.0000 mL | Freq: Once | ORAL | Status: AC
  Administered 2023-04-19: 30 mL via ORAL
  Filled 2023-04-19: qty 30

## 2023-04-19 MED ORDER — LIDOCAINE VISCOUS HCL 2 % MT SOLN
15.0000 mL | Freq: Once | OROMUCOSAL | Status: AC
  Administered 2023-04-19: 15 mL via ORAL
  Filled 2023-04-19: qty 15

## 2023-04-19 NOTE — ED Provider Notes (Signed)
[place] EMERGENCY DEPARTMENT AT [facility] Provider Note   CSN: [id number] Arrival date & time: 04/19/23  1926     History  Chief Complaint  Patient presents with   Altered Mental Status    Teresa Poole is a 72 y.o. female with past medical history significant for diabetes, history of ovarian cancer, systolic diastolic heart failure, renal artery stenosis, chronic kidney disease, dementia who presents with concern for indigestion.  Patient was at [facility], reportedly sister is in care of the patient but patient is under the impression that sister is out of town, and [place], no one has been able to get in contact with her.  Patient endorses driving to [facility] and is not supposed to be driving at this time.  She denies any chest pain, shortness of breath, or other abnormality.  HPI     Home Medications Prior to Admission medications   Medication Sig Start Date End Date Taking? Authorizing Provider  amLODipine (NORVASC) 10 MG tablet Take 1 tablet (10 mg total) by mouth daily. 01/06/23   Corky Crafts, MD  aspirin EC 81 MG tablet Take 1 tablet (81 mg total) by mouth daily. Swallow whole. Patient taking differently: Take 81 mg by mouth at bedtime. Swallow whole. 11/14/22   Regalado, Belkys A, MD  azelastine (ASTELIN) 0.1 % nasal spray Place 2 sprays into both nostrils 2 (two) times daily. 12/02/22   [provider]  carvedilol (COREG) 12.5 MG tablet Take 1 tablet (12.5 mg total) by mouth 2 (two) times daily with a meal. 01/29/23   Burnadette Pop, MD  cephALEXin (KEFLEX) 500 MG capsule Take 1 capsule (500 mg total) by mouth 4 (four) times daily. 03/30/23   Elayne Snare K, DO  cetirizine (ZYRTEC) 10 MG tablet Take 10 mg by mouth daily as needed for allergies. 11/19/22   [provider]  cyanocobalamin 1000 MCG tablet Take 1 tablet (1,000 mcg total) by mouth daily. 11/14/22   Regalado, Belkys A, MD  glucose blood (ONE TOUCH ULTRA TEST)  test strip Use to check blood sugar 3 times daily Dx code E11.65 Patient taking differently: 1 each by Other route See admin instructions. Use to check blood sugar 3 times daily Dx code E11.65 11/20/14   Reather Littler, MD  haloperidol (HALDOL) 1 MG tablet Take 1 tablet (1 mg total) by mouth every 3 (three) hours as needed for agitation. Patient not taking: Reported on 02/23/2023 01/29/23   Burnadette Pop, MD  ferrous sulfate 325 (65 FE) MG tablet Take 1 tablet (325 mg total) by mouth daily. Patient not taking: Reported on 02/23/2023 01/29/23   Burnadette Pop, MD  isosorbide mononitrate (IMDUR) 30 MG 24 hr tablet Take 1 tablet (30 mg total) by mouth daily. 01/30/23   Burnadette Pop, MD  OZEMPIC, 0.25 OR 0.5 MG/DOSE, 2 MG/3ML SOPN Inject 0.25 mg into the skin once a week. 12/20/22   [provider]  pantoprazole (PROTONIX) 20 MG tablet Take 1 tablet (20 mg total) by mouth 2 (two) times daily. Patient taking differently: Take 40 mg by mouth every evening. 12/22/22 04/21/23  Dorcas Carrow, MD  patiromer (VELTASSA) 8.4 g packet Take 1 packet (8.4 g total) by mouth daily. Patient not taking: Reported on 02/23/2023 02/01/23   Russella Dar, NP  polyethylene glycol powder (GLYCOLAX/MIRALAX) 17 GM/SCOOP powder Take 17 g by mouth daily. 01/30/23   Burnadette Pop, MD  rosuvastatin (CRESTOR) 40 MG tablet Take 1 tablet (40 mg total) by mouth  daily. 12/17/22   Sharlene Dory, PA-C  senna (SENOKOT) 8.6 MG TABS tablet Take 1 tablet (8.6 mg total) by mouth daily. 01/30/23   Burnadette Pop, MD  sucralfate (CARAFATE) 1 g tablet Take 1 tablet (1 g total) by mouth with breakfast, with lunch, and with evening meal for 7 days. 12/27/22 01/03/23  Tanda Rockers A, DO  thiamine (VITAMIN B-1) 100 MG tablet Take 1 tablet (100 mg total) by mouth daily. 11/14/22   Regalado, Belkys A, MD  iron polysaccharides (NIFEREX) 150 MG capsule Take 1 capsule (150 mg total) by mouth daily. Patient not taking: Reported on 12/22/2022 11/14/22 01/29/23   Hartley Barefoot A, MD      Allergies    Hydralazine hcl and Atorvastatin    Review of Systems   Review of Systems  All other systems reviewed and are negative.   Physical Exam Updated Vital Signs BP (!) 148/71 (BP Location: Left Arm)   Pulse 77   Temp 98.1 F (36.7 C) (Oral)   Resp 18   Ht 5\' 7"  (1.702 m)   Wt 78 kg   SpO2 100%   BMI 26.93 kg/m  Physical Exam Vitals and nursing note reviewed.  Constitutional:      General: She is not in acute distress.    Appearance: Normal appearance.  HENT:     Head: Normocephalic and atraumatic.  Eyes:     General:        Right eye: No discharge.        Left eye: No discharge.  Cardiovascular:     Rate and Rhythm: Normal rate and regular rhythm.     Heart sounds: No murmur heard.    No friction rub. No gallop.  Pulmonary:     Effort: Pulmonary effort is normal.     Breath sounds: Normal breath sounds.  Abdominal:     General: Bowel sounds are normal.     Palpations: Abdomen is soft.     Comments: no significant tenderness palpation of the epigastric region  Skin:    General: Skin is warm and dry.     Capillary Refill: Capillary refill takes less than 2 seconds.  Neurological:     Mental Status: She is alert. Mental status is at baseline.  Psychiatric:        Mood and Affect: Mood normal.        Behavior: Behavior normal.     ED Results / Procedures / Treatments   Labs (all labs ordered are listed, but only abnormal results are displayed) Labs Reviewed  CBC - Abnormal; Notable for the following components:      Result Value   RBC 3.53 (*)    Hemoglobin 10.0 (*)    HCT 31.5 (*)    All other components within normal limits  COMPREHENSIVE METABOLIC PANEL - Abnormal; Notable for the following components:   Sodium 134 (*)    Glucose, Bld 118 (*)    BUN 64 (*)    Creatinine, Ser 5.91 (*)    Calcium 8.1 (*)    Albumin 3.2 (*)    GFR, Estimated 7 (*)    All other components within normal limits  LIPASE, BLOOD -  Abnormal; Notable for the following components:   Lipase 59 (*)    All other components within normal limits    EKG None  Radiology No results found.  Procedures Procedures    Medications Ordered in ED Medications  alum & mag hydroxide-simeth (MAALOX/MYLANTA) 200-200-20 MG/5ML suspension 30  mL (has no administration in time range)    And  lidocaine (XYLOCAINE) 2 % viscous mouth solution 15 mL (has no administration in time range)    ED Course/ Medical Decision Making/ A&P Clinical Course as of 04/19/23 2317  Mon Apr 19, 2023  2209 [phone] sister will pick up when ready to leave [CP]    Clinical Course User Index [CP] Olene Floss, PA-C                                 Medical Decision Making Risk OTC drugs. Prescription drug management.   This patient is a 72 y.o. female  who presents to the ED for concern of indigestion, confusion.   Differential diagnoses prior to evaluation: The emergent differential diagnosis includes, but is not limited to, GERD, atypical chest pain, other acute intraabdominal abnormality. This is not an exhaustive differential.   Past Medical History / Co-morbidities / Social History: diabetes, history of ovarian cancer, systolic diastolic heart failure, renal artery stenosis, chronic kidney disease, dementia  Additional history: Chart reviewed. Pertinent results include: reviewed labwork, imaging from previous emergency department visits  Physical Exam: Physical exam performed. The pertinent findings include: no significant tenderness to palpation of the abdomen. Vital signs overall stable, mild hypertension, bp 148/71  Lab Tests/Imaging studies: I personally interpreted labs/imaging and the pertinent results include: CMP notable for mild hyponatremia, sodium 134, elevated BUN, creatinine, 64, 5.91 respectively, not significantly changed,/mildly improved from baseline.  Lipase mildly elevated at 59, this is stable compared to  baseline. CBC with mild anemia, hemoglobin 10, no other acute abnormality, no significant change from baseline.   Cardiac monitoring: EKG obtained and interpreted by myself and attending physician which shows: NSR, LBBB   Medications: I ordered medication including GI cocktail for indigestion.  I have reviewed the patients home medicines and have made adjustments as needed.   Disposition: After consideration of the diagnostic results and the patients response to treatment, I feel that patient is stable for discharge with plan as above, presents to the emergency department mostly secondary to confusion, but endorsed some ongoing indigestion.  Her lab work is stable, and she is tolerating p.o. without any emesis.Marland Kitchen   emergency department workup does not suggest an emergent condition requiring admission or immediate intervention beyond what has been performed at this time. The plan is: as above. The patient is safe for discharge and has been instructed to return immediately for worsening symptoms, change in symptoms or any other concerns.  Final Clinical Impression(s) / ED Diagnoses Final diagnoses:  Mild dementia with agitation, unspecified dementia type [facility])  Indigestion    Rx / DC Orders ED Discharge Orders     None         [place] 04/19/23 2317    Lorre Nick, MD 04/21/23 [phone]

## 2023-04-19 NOTE — ED Triage Notes (Signed)
BIBA Per EMS: Pt coming from [facility]. Hx dementia. Sister supposed to care for patient but sister is out of town & no one can get in contact with her. Pt not supposed to be driving.  VSS  C/o indigestion

## 2023-04-19 NOTE — ED Notes (Signed)
Pt's sister is here to take her home

## 2023-04-19 NOTE — ED Provider Notes (Signed)
I provided a substantive portion of the care of this patient.  I personally made/approved the management plan for this patient and take responsibility for the patient management.      Patient here after being found in [facility].  Patient has a history of cognitive impairment.  Patient has a sister normally lives with her but she is away at this time.  Have attempted to contact her.  Patient states that she does not have any food in her house.  Will attempt to contact relatives with them to come and get patient   Lorre Nick, MD 04/19/23 2157

## 2023-04-19 NOTE — Social Work (Signed)
Provider reached out to CSW with concerns about the patient getting home as she was picked up [facility] the [facility]. This CSW has called the patient's sister and informed the sister that the patient will need someone to get here. The patients sister has agreed to get the patient. At this time there are no further TOC needs.

## 2023-06-07 ENCOUNTER — Emergency Department (HOSPITAL_COMMUNITY)

## 2023-06-07 ENCOUNTER — Encounter (HOSPITAL_COMMUNITY): Payer: Self-pay

## 2023-06-07 ENCOUNTER — Emergency Department (HOSPITAL_COMMUNITY)
Admission: EM | Admit: 2023-06-07 | Discharge: 2023-06-08 | Disposition: A | Discharge status: Home / Self Care | Attending: Emergency Medicine | Admitting: Emergency Medicine

## 2023-06-07 DIAGNOSIS — D631 Anemia in chronic kidney disease: Secondary | ICD-10-CM | POA: Insufficient documentation

## 2023-06-07 DIAGNOSIS — R7989 Other specified abnormal findings of blood chemistry: Secondary | ICD-10-CM | POA: Insufficient documentation

## 2023-06-07 DIAGNOSIS — I130 Hypertensive heart and chronic kidney disease with heart failure and stage 1 through stage 4 chronic kidney disease, or unspecified chronic kidney disease: Secondary | ICD-10-CM | POA: Diagnosis not present

## 2023-06-07 DIAGNOSIS — I5032 Chronic diastolic (congestive) heart failure: Secondary | ICD-10-CM | POA: Diagnosis not present

## 2023-06-07 DIAGNOSIS — Z8543 Personal history of malignant neoplasm of ovary: Secondary | ICD-10-CM | POA: Insufficient documentation

## 2023-06-07 DIAGNOSIS — Z79899 Other long term (current) drug therapy: Secondary | ICD-10-CM | POA: Insufficient documentation

## 2023-06-07 DIAGNOSIS — R140 Abdominal distension (gaseous): Secondary | ICD-10-CM | POA: Insufficient documentation

## 2023-06-07 DIAGNOSIS — N189 Chronic kidney disease, unspecified: Secondary | ICD-10-CM | POA: Diagnosis not present

## 2023-06-07 DIAGNOSIS — Z7982 Long term (current) use of aspirin: Secondary | ICD-10-CM | POA: Insufficient documentation

## 2023-06-07 DIAGNOSIS — N289 Disorder of kidney and ureter, unspecified: Secondary | ICD-10-CM | POA: Insufficient documentation

## 2023-06-07 DIAGNOSIS — R079 Chest pain, unspecified: Secondary | ICD-10-CM

## 2023-06-07 DIAGNOSIS — F0390 Unspecified dementia without behavioral disturbance: Secondary | ICD-10-CM | POA: Diagnosis not present

## 2023-06-07 DIAGNOSIS — E1122 Type 2 diabetes mellitus with diabetic chronic kidney disease: Secondary | ICD-10-CM | POA: Diagnosis not present

## 2023-06-07 DIAGNOSIS — R0789 Other chest pain: Secondary | ICD-10-CM | POA: Diagnosis present

## 2023-06-07 DIAGNOSIS — I10 Essential (primary) hypertension: Secondary | ICD-10-CM

## 2023-06-07 LAB — CBC
HCT: 28.6 % — ABNORMAL LOW (ref 36.0–46.0)
Hemoglobin: 9.1 g/dL — ABNORMAL LOW (ref 12.0–15.0)
MCH: 29 pg (ref 26.0–34.0)
MCHC: 31.8 g/dL (ref 30.0–36.0)
MCV: 91.1 fL (ref 80.0–100.0)
Platelets: 230 10*3/uL (ref 150–400)
RBC: 3.14 MIL/uL — ABNORMAL LOW (ref 3.87–5.11)
RDW: 14.1 % (ref 11.5–15.5)
WBC: 5.2 10*3/uL (ref 4.0–10.5)
nRBC: 0 % (ref 0.0–0.2)

## 2023-06-07 LAB — BASIC METABOLIC PANEL
Anion gap: 14 (ref 5–15)
BUN: 69 mg/dL — ABNORMAL HIGH (ref 8–23)
CO2: 21 mmol/L — ABNORMAL LOW (ref 22–32)
Calcium: 8.2 mg/dL — ABNORMAL LOW (ref 8.9–10.3)
Chloride: 105 mmol/L (ref 98–111)
Creatinine, Ser: 6.25 mg/dL — ABNORMAL HIGH (ref 0.44–1.00)
GFR, Estimated: 7 mL/min — ABNORMAL LOW (ref >60)
Glucose, Bld: 114 mg/dL — ABNORMAL HIGH (ref 70–99)
Potassium: 4.3 mmol/L (ref 3.5–5.1)
Sodium: 140 mmol/L (ref 135–145)

## 2023-06-07 LAB — TROPONIN I (HIGH SENSITIVITY)
Troponin I (High Sensitivity): 22 ng/L — ABNORMAL HIGH (ref <18)
Troponin I (High Sensitivity): 23 ng/L — ABNORMAL HIGH (ref <18)

## 2023-06-07 NOTE — ED Provider Notes (Signed)
[place] EMERGENCY DEPARTMENT AT [facility] Provider Note   CSN: [id number] Arrival date & time: 06/07/23  1818     History  Chief Complaint  Patient presents with   Chest Pain    Teresa Poole is a 72 y.o. female.  The history is provided by the patient.  Chest Pain She has history of hypertension diabetes, chronic kidney disease, diastolic heart failure, ovarian cancer, dementia and comes in because of chest discomfort.  Patient had been in the emergency department for 5 hours when I initially went to evaluate her and she actually did not remember why she came in.  She is now complaining of bloating in her abdomen and feeling like she has to pass flatus but is unable to do so.  There is some mild associated nausea but she has not vomited.  She does endorse chronic constipation.  She denies dyspnea and diaphoresis.   Home Medications Prior to Admission medications   Medication Sig Start Date End Date Taking? Authorizing Provider  amLODipine (NORVASC) 10 MG tablet Take 1 tablet (10 mg total) by mouth daily. 01/06/23   Corky Crafts, MD  aspirin EC 81 MG tablet Take 1 tablet (81 mg total) by mouth daily. Swallow whole. Patient taking differently: Take 81 mg by mouth at bedtime. Swallow whole. 11/14/22   Regalado, Belkys A, MD  azelastine (ASTELIN) 0.1 % nasal spray Place 2 sprays into both nostrils 2 (two) times daily. 12/02/22   [provider]  carvedilol (COREG) 12.5 MG tablet Take 1 tablet (12.5 mg total) by mouth 2 (two) times daily with a meal. 01/29/23   Burnadette Pop, MD  cephALEXin (KEFLEX) 500 MG capsule Take 1 capsule (500 mg total) by mouth 4 (four) times daily. 03/30/23   Elayne Snare K, DO  cetirizine (ZYRTEC) 10 MG tablet Take 10 mg by mouth daily as needed for allergies. 11/19/22   [provider]  cyanocobalamin 1000 MCG tablet Take 1 tablet (1,000 mcg total) by mouth daily. 11/14/22   Regalado, Belkys A, MD  glucose blood  (ONE TOUCH ULTRA TEST) test strip Use to check blood sugar 3 times daily Dx code E11.65 Patient taking differently: 1 each by Other route See admin instructions. Use to check blood sugar 3 times daily Dx code E11.65 11/20/14   Reather Littler, MD  haloperidol (HALDOL) 1 MG tablet Take 1 tablet (1 mg total) by mouth every 3 (three) hours as needed for agitation. Patient not taking: Reported on 02/23/2023 01/29/23   Burnadette Pop, MD  ferrous sulfate 325 (65 FE) MG tablet Take 1 tablet (325 mg total) by mouth daily. Patient not taking: Reported on 02/23/2023 01/29/23   Burnadette Pop, MD  isosorbide mononitrate (IMDUR) 30 MG 24 hr tablet Take 1 tablet (30 mg total) by mouth daily. 01/30/23   Burnadette Pop, MD  OZEMPIC, 0.25 OR 0.5 MG/DOSE, 2 MG/3ML SOPN Inject 0.25 mg into the skin once a week. 12/20/22   [provider]  pantoprazole (PROTONIX) 20 MG tablet Take 1 tablet (20 mg total) by mouth 2 (two) times daily. Patient taking differently: Take 40 mg by mouth every evening. 12/22/22 04/21/23  Dorcas Carrow, MD  patiromer (VELTASSA) 8.4 g packet Take 1 packet (8.4 g total) by mouth daily. Patient not taking: Reported on 02/23/2023 02/01/23   Russella Dar, NP  polyethylene glycol powder (GLYCOLAX/MIRALAX) 17 GM/SCOOP powder Take 17 g by mouth daily. 01/30/23   Burnadette Pop, MD  rosuvastatin (CRESTOR) 40 MG tablet  Take 1 tablet (40 mg total) by mouth daily. 12/17/22   Sharlene Dory, PA-C  senna (SENOKOT) 8.6 MG TABS tablet Take 1 tablet (8.6 mg total) by mouth daily. 01/30/23   Burnadette Pop, MD  sucralfate (CARAFATE) 1 g tablet Take 1 tablet (1 g total) by mouth with breakfast, with lunch, and with evening meal for 7 days. 12/27/22 01/03/23  Tanda Rockers A, DO  thiamine (VITAMIN B-1) 100 MG tablet Take 1 tablet (100 mg total) by mouth daily. 11/14/22   Regalado, Belkys A, MD  iron polysaccharides (NIFEREX) 150 MG capsule Take 1 capsule (150 mg total) by mouth daily. Patient not taking: Reported on  12/22/2022 11/14/22 01/29/23  Hartley Barefoot A, MD      Allergies    Hydralazine hcl and Atorvastatin    Review of Systems   Review of Systems  Cardiovascular:  Positive for chest pain.  All other systems reviewed and are negative.   Physical Exam Updated Vital Signs BP (!) 186/72 (BP Location: Right Arm)   Pulse 74   Temp 98.1 F (36.7 C) (Oral)   Resp 16   SpO2 100%  Physical Exam Vitals and nursing note reviewed.   72 year old female, resting comfortably and in no acute distress. Vital signs are significant for elevated blood pressure. Oxygen saturation is 100%, which is normal. Head is normocephalic and atraumatic. PERRLA, EOMI. Oropharynx is clear. Neck is nontender and supple without adenopathy or JVD.  Bilateral carotid bruits are present, louder on the left. Back is nontender and there is no CVA tenderness. Lungs are clear without rales, wheezes, or rhonchi. Chest is nontender. Heart has regular rate and rhythm without murmur. Abdomen is soft, flat, nontender -even to deep palpation. Extremities have no cyanosis or edema, full range of motion is present. Skin is warm and dry without rash. Neurologic: Awake and alert, cranial nerves are intact, moves all extremities equally.  ED Results / Procedures / Treatments   Labs (all labs ordered are listed, but only abnormal results are displayed) Labs Reviewed  BASIC METABOLIC PANEL - Abnormal; Notable for the following components:      Result Value   CO2 21 (*)    Glucose, Bld 114 (*)    BUN 69 (*)    Creatinine, Ser 6.25 (*)    Calcium 8.2 (*)    GFR, Estimated 7 (*)    All other components within normal limits  CBC - Abnormal; Notable for the following components:   RBC 3.14 (*)    Hemoglobin 9.1 (*)    HCT 28.6 (*)    All other components within normal limits  TROPONIN I (HIGH SENSITIVITY) - Abnormal; Notable for the following components:   Troponin I (High Sensitivity) 22 (*)    All other components within  normal limits  TROPONIN I (HIGH SENSITIVITY) - Abnormal; Notable for the following components:   Troponin I (High Sensitivity) 23 (*)    All other components within normal limits    EKG EKG Interpretation Date/Time:  Monday June 07 2023 17:59:44 EDT Ventricular Rate:  77 PR Interval:  218 QRS Duration:  152 QT Interval:  452 QTC Calculation: 511 R Axis:   -32  Text Interpretation: Sinus rhythm with 1st degree A-V block Left axis deviation Right bundle branch block When compared with ECG of 04/19/2023, No significant change was found Reconfirmed by Dione Booze ([id number]) on 06/07/2023 11:09:15 PM  Radiology DG Chest 2 View  Result Date: 06/07/2023 CLINICAL DATA:  Chest  pressure and shortness of breath. EXAM: CHEST - 2 VIEW COMPARISON:  April 06, 2023 FINDINGS: The heart size and mediastinal contours are within normal limits. There is marked severity calcification of the aortic arch. Both lungs are clear. Multilevel degenerative changes are noted throughout the thoracic spine. IMPRESSION: No active cardiopulmonary disease. Electronically Signed   By: Aram Candela M.D.   On: 06/07/2023 21:30    Procedures Procedures    Medications Ordered in ED Medications - No data to display  ED Course/ Medical Decision Making/ A&P                                 Medical Decision Making Amount and/or Complexity of Data Reviewed Labs: ordered. Radiology: ordered.   Episode of chest pain which had resolved by the time I saw the patient, abdominal bloating which may be part of constipation.  I have reviewed her electrocardiogram, my interpretation is right bundle branch block with left axis deviation and first-degree AV block unchanged from prior.  Chest x-ray shows no active cardiopulmonary disease.  Have independently viewed the images, and agree with the radiologist's interpretation.  I have reviewed her laboratory tests, and my interpretation is renal insufficiency not significantly  changed from baseline, mildly elevated troponin in the range that she has been at the past, no change on repeat, stable anemia likely secondary to end-stage renal disease.  I have reviewed her past records and echocardiogram on 11/06/2022 showed ejection fraction 45-50% and grade 2 diastolic dysfunction.  Cardiology office visit on 12/14/2022 did not identify any active cardiac issues.  I have explained to the patient that there is no evidence of acute coronary injury today, symptoms may be related to constipation.  I recommended that she use polyethylene glycol for constipation, return if she has new or concerning symptoms.  Final Clinical Impression(s) / ED Diagnoses Final diagnoses:  Nonspecific chest pain  Abdominal bloating  Renal insufficiency  Elevated blood pressure reading with diagnosis of hypertension  Anemia associated with chronic renal failure  Elevated troponin    Rx / DC Orders ED Discharge Orders     None         Dione Booze, MD 06/07/23 2344

## 2023-06-07 NOTE — Discharge Instructions (Addendum)
Your evaluation did not show any sign of any heart problems.  Take polyethylene glycol (MiraLAX) as needed for constipation.  Return to the emergency department if you have any new or concerning symptoms.

## 2023-06-07 NOTE — ED Notes (Signed)
ED Provider at bedside. 

## 2023-06-07 NOTE — ED Triage Notes (Signed)
Pt has been having chest pressure/pain x 1 hr accompanied with some SHOB. Pt has no other complaints with the chest pain/pressure. She is a hospice patient, She DNR.   Medic vitals   132/84 76hr 100%ra 131bgl 18rr  20g left a/c

## 2023-06-11 ENCOUNTER — Other Ambulatory Visit: Payer: Self-pay

## 2023-06-11 ENCOUNTER — Emergency Department (HOSPITAL_COMMUNITY)

## 2023-06-11 ENCOUNTER — Emergency Department (HOSPITAL_COMMUNITY)
Admission: EM | Admit: 2023-06-11 | Discharge: 2023-06-11 | Disposition: A | Discharge status: Home / Self Care | Attending: Emergency Medicine | Admitting: Emergency Medicine

## 2023-06-11 DIAGNOSIS — R4182 Altered mental status, unspecified: Secondary | ICD-10-CM | POA: Insufficient documentation

## 2023-06-11 DIAGNOSIS — E1122 Type 2 diabetes mellitus with diabetic chronic kidney disease: Secondary | ICD-10-CM | POA: Insufficient documentation

## 2023-06-11 DIAGNOSIS — Z79899 Other long term (current) drug therapy: Secondary | ICD-10-CM | POA: Diagnosis not present

## 2023-06-11 DIAGNOSIS — I509 Heart failure, unspecified: Secondary | ICD-10-CM | POA: Insufficient documentation

## 2023-06-11 DIAGNOSIS — I130 Hypertensive heart and chronic kidney disease with heart failure and stage 1 through stage 4 chronic kidney disease, or unspecified chronic kidney disease: Secondary | ICD-10-CM | POA: Insufficient documentation

## 2023-06-11 DIAGNOSIS — Z8543 Personal history of malignant neoplasm of ovary: Secondary | ICD-10-CM | POA: Diagnosis not present

## 2023-06-11 DIAGNOSIS — Z1152 Encounter for screening for COVID-19: Secondary | ICD-10-CM | POA: Diagnosis not present

## 2023-06-11 DIAGNOSIS — Z7982 Long term (current) use of aspirin: Secondary | ICD-10-CM | POA: Insufficient documentation

## 2023-06-11 DIAGNOSIS — I2510 Atherosclerotic heart disease of native coronary artery without angina pectoris: Secondary | ICD-10-CM | POA: Diagnosis not present

## 2023-06-11 DIAGNOSIS — R0789 Other chest pain: Secondary | ICD-10-CM | POA: Insufficient documentation

## 2023-06-11 DIAGNOSIS — R059 Cough, unspecified: Secondary | ICD-10-CM | POA: Insufficient documentation

## 2023-06-11 DIAGNOSIS — N189 Chronic kidney disease, unspecified: Secondary | ICD-10-CM | POA: Diagnosis not present

## 2023-06-11 LAB — CBC
HCT: 26.2 % — ABNORMAL LOW (ref 36.0–46.0)
Hemoglobin: 8.6 g/dL — ABNORMAL LOW (ref 12.0–15.0)
MCH: 28.8 pg (ref 26.0–34.0)
MCHC: 32.8 g/dL (ref 30.0–36.0)
MCV: 87.6 fL (ref 80.0–100.0)
Platelets: 224 10*3/uL (ref 150–400)
RBC: 2.99 MIL/uL — ABNORMAL LOW (ref 3.87–5.11)
RDW: 14.1 % (ref 11.5–15.5)
WBC: 5.1 10*3/uL (ref 4.0–10.5)
nRBC: 0 % (ref 0.0–0.2)

## 2023-06-11 LAB — TROPONIN I (HIGH SENSITIVITY)
Troponin I (High Sensitivity): 19 ng/L — ABNORMAL HIGH (ref <18)
Troponin I (High Sensitivity): 22 ng/L — ABNORMAL HIGH (ref <18)

## 2023-06-11 LAB — BASIC METABOLIC PANEL
Anion gap: 15 (ref 5–15)
BUN: 70 mg/dL — ABNORMAL HIGH (ref 8–23)
CO2: 21 mmol/L — ABNORMAL LOW (ref 22–32)
Calcium: 8.5 mg/dL — ABNORMAL LOW (ref 8.9–10.3)
Chloride: 104 mmol/L (ref 98–111)
Creatinine, Ser: 6.3 mg/dL — ABNORMAL HIGH (ref 0.44–1.00)
GFR, Estimated: 7 mL/min — ABNORMAL LOW (ref >60)
Glucose, Bld: 113 mg/dL — ABNORMAL HIGH (ref 70–99)
Potassium: 4.4 mmol/L (ref 3.5–5.1)
Sodium: 140 mmol/L (ref 135–145)

## 2023-06-11 LAB — RESP PANEL BY RT-PCR (RSV, FLU A&B, COVID)  RVPGX2
Influenza A by PCR: NEGATIVE
Influenza B by PCR: NEGATIVE
Resp Syncytial Virus by PCR: NEGATIVE
SARS Coronavirus 2 by RT PCR: NEGATIVE

## 2023-06-11 MED ORDER — ALUM & MAG HYDROXIDE-SIMETH 200-200-20 MG/5ML PO SUSP
30.0000 mL | Freq: Once | ORAL | Status: AC
  Administered 2023-06-11: 30 mL via ORAL
  Filled 2023-06-11: qty 30

## 2023-06-11 MED ORDER — ASPIRIN 81 MG PO CHEW
324.0000 mg | CHEWABLE_TABLET | Freq: Once | ORAL | Status: AC
  Administered 2023-06-11: 324 mg via ORAL
  Filled 2023-06-11: qty 4

## 2023-06-11 MED ORDER — LIDOCAINE VISCOUS HCL 2 % MT SOLN
15.0000 mL | Freq: Once | OROMUCOSAL | Status: AC
  Administered 2023-06-11: 15 mL via ORAL
  Filled 2023-06-11: qty 15

## 2023-06-11 NOTE — Discharge Instructions (Signed)
You have been seen today for your complaint of chest pain. Your lab work was reassuring. Your imaging was reassuring. Follow up with: your primary care provider as soon as possible Please seek immediate medical care if you develop any of the following symptoms: Your chest pain gets worse. You have a cough that gets worse, or you cough up blood. You have severe pain in your abdomen. You faint. You have sudden, unexplained chest discomfort. You have sudden, unexplained discomfort in your arms, back, neck, or jaw. You have shortness of breath at any time. You suddenly start to sweat, or your skin gets clammy. You feel nausea or you vomit. You suddenly feel lightheaded or dizzy. You have severe weakness, or unexplained weakness or fatigue. Your heart begins to beat quickly, or it feels like it is skipping beats. At this time there does not appear to be the presence of an emergent medical condition, however there is always the potential for conditions to change. Please read and follow the below instructions.  Do not take your medicine if  develop an itchy rash, swelling in your mouth or lips, or difficulty breathing; call 911 and seek immediate emergency medical attention if this occurs.  You may review your lab tests and imaging results in their entirety on your MyChart account.  Please discuss all results of fully with your primary care provider and other specialist at your follow-up visit.  Note: Portions of this text may have been transcribed using voice recognition software. Every effort was made to ensure accuracy; however, inadvertent computerized transcription errors may still be present.

## 2023-06-11 NOTE — ED Provider Notes (Signed)
[place] EMERGENCY DEPARTMENT AT [facility] Provider Note   CSN: [id number] Arrival date & time: 06/11/23  1455     History  Chief Complaint  Patient presents with   Altered Mental Status   Chest Pain    Teresa Poole is a 72 y.o. female.  With a significant history including but not limited to type 2 diabetes, hypertension, history of ovarian cancer, CHF, CAD, renal artery stenosis, CKD presenting to the ED via EMS for evaluation of chest congestion.  She states she noticed a chest pressure approximately 1 hour ago while she was sitting at home.  This pressure is described as chest congestion.  She is also complaining of nasal congestion and bilateral ear pressure.  She states a similar episode happened last week.  She denies any sharp or stabbing pain in the chest.  No shortness of breath or cough.  No abdominal pain, nausea, vomiting, diaphoresis.  Symptoms do not radiate.  Not associated with activity.  She will also reports some poor memory.  States he has had this for quite some time.  She denies any recent change to the symptoms.   Altered Mental Status Chest Pain Associated symptoms: altered mental status        Home Medications Prior to Admission medications   Medication Sig Start Date End Date Taking? Authorizing Provider  amLODipine (NORVASC) 10 MG tablet Take 1 tablet (10 mg total) by mouth daily. 01/06/23   Corky Crafts, MD  aspirin EC 81 MG tablet Take 1 tablet (81 mg total) by mouth daily. Swallow whole. Patient taking differently: Take 81 mg by mouth at bedtime. Swallow whole. 11/14/22   Regalado, Belkys A, MD  azelastine (ASTELIN) 0.1 % nasal spray Place 2 sprays into both nostrils 2 (two) times daily. 12/02/22   [provider]  carvedilol (COREG) 12.5 MG tablet Take 1 tablet (12.5 mg total) by mouth 2 (two) times daily with a meal. 01/29/23   Burnadette Pop, MD  cetirizine (ZYRTEC) 10 MG tablet Take 10 mg by mouth daily as needed  for allergies. 11/19/22   [provider]  cyanocobalamin 1000 MCG tablet Take 1 tablet (1,000 mcg total) by mouth daily. 11/14/22   Regalado, Belkys A, MD  glucose blood (ONE TOUCH ULTRA TEST) test strip Use to check blood sugar 3 times daily Dx code E11.65 Patient taking differently: 1 each by Other route See admin instructions. Use to check blood sugar 3 times daily Dx code E11.65 11/20/14   Reather Littler, MD  haloperidol (HALDOL) 1 MG tablet Take 1 tablet (1 mg total) by mouth every 3 (three) hours as needed for agitation. Patient not taking: Reported on 02/23/2023 01/29/23   Burnadette Pop, MD  ferrous sulfate 325 (65 FE) MG tablet Take 1 tablet (325 mg total) by mouth daily. Patient not taking: Reported on 02/23/2023 01/29/23   Burnadette Pop, MD  isosorbide mononitrate (IMDUR) 30 MG 24 hr tablet Take 1 tablet (30 mg total) by mouth daily. 01/30/23   Burnadette Pop, MD  OZEMPIC, 0.25 OR 0.5 MG/DOSE, 2 MG/3ML SOPN Inject 0.25 mg into the skin once a week. 12/20/22   [provider]  pantoprazole (PROTONIX) 20 MG tablet Take 1 tablet (20 mg total) by mouth 2 (two) times daily. Patient taking differently: Take 40 mg by mouth every evening. 12/22/22 04/21/23  Dorcas Carrow, MD  patiromer (VELTASSA) 8.4 g packet Take 1 packet (8.4 g total) by mouth daily. Patient not taking: Reported on 02/23/2023 02/01/23  Russella Dar, NP  polyethylene glycol powder (GLYCOLAX/MIRALAX) 17 GM/SCOOP powder Take 17 g by mouth daily. 01/30/23   Burnadette Pop, MD  rosuvastatin (CRESTOR) 40 MG tablet Take 1 tablet (40 mg total) by mouth daily. 12/17/22   Sharlene Dory, PA-C  senna (SENOKOT) 8.6 MG TABS tablet Take 1 tablet (8.6 mg total) by mouth daily. 01/30/23   Burnadette Pop, MD  sucralfate (CARAFATE) 1 g tablet Take 1 tablet (1 g total) by mouth with breakfast, with lunch, and with evening meal for 7 days. 12/27/22 01/03/23  Tanda Rockers A, DO  thiamine (VITAMIN B-1) 100 MG tablet Take 1 tablet (100 mg total) by  mouth daily. 11/14/22   Regalado, Belkys A, MD  iron polysaccharides (NIFEREX) 150 MG capsule Take 1 capsule (150 mg total) by mouth daily. Patient not taking: Reported on 12/22/2022 11/14/22 01/29/23  Hartley Barefoot A, MD      Allergies    Hydralazine hcl and Atorvastatin    Review of Systems   Review of Systems  HENT:  Positive for congestion.   Cardiovascular:  Positive for chest pain.  All other systems reviewed and are negative.   Physical Exam Updated Vital Signs BP (!) 147/64   Pulse 85   Temp 98.1 F (36.7 C) (Oral)   Resp 18   Ht 5\' 7"  (1.702 m)   Wt 77.6 kg   SpO2 100%   BMI 26.78 kg/m  Physical Exam Vitals and nursing note reviewed.  Constitutional:      General: She is not in acute distress.    Appearance: She is well-developed.     Comments: Resting comfortably in bed  HENT:     Head: Normocephalic and atraumatic.  Eyes:     Conjunctiva/sclera: Conjunctivae normal.  Cardiovascular:     Rate and Rhythm: Normal rate and regular rhythm.     Pulses:          Radial pulses are 2+ on the right side and 2+ on the left side.     Heart sounds: Murmur heard.  Pulmonary:     Effort: Pulmonary effort is normal. No respiratory distress.     Breath sounds: Normal breath sounds. No decreased breath sounds, wheezing, rhonchi or rales.  Abdominal:     Palpations: Abdomen is soft.     Tenderness: There is no abdominal tenderness.  Musculoskeletal:        General: No swelling.     Cervical back: Neck supple.  Skin:    General: Skin is warm and dry.     Capillary Refill: Capillary refill takes less than 2 seconds.  Neurological:     General: No focal deficit present.     Mental Status: She is alert and oriented to person, place, and time.     Comments: Fully alert and oriented  Psychiatric:        Mood and Affect: Mood normal.        Behavior: Behavior normal.     ED Results / Procedures / Treatments   Labs (all labs ordered are listed, but only abnormal  results are displayed) Labs Reviewed  BASIC METABOLIC PANEL - Abnormal; Notable for the following components:      Result Value   CO2 21 (*)    Glucose, Bld 113 (*)    BUN 70 (*)    Creatinine, Ser 6.30 (*)    Calcium 8.5 (*)    GFR, Estimated 7 (*)    All other components within normal limits  CBC - Abnormal; Notable for the following components:   RBC 2.99 (*)    Hemoglobin 8.6 (*)    HCT 26.2 (*)    All other components within normal limits  TROPONIN I (HIGH SENSITIVITY) - Abnormal; Notable for the following components:   Troponin I (High Sensitivity) 19 (*)    All other components within normal limits  TROPONIN I (HIGH SENSITIVITY) - Abnormal; Notable for the following components:   Troponin I (High Sensitivity) 22 (*)    All other components within normal limits  RESP PANEL BY RT-PCR (RSV, FLU A&B, COVID)  [id number]    EKG EKG Interpretation Date/Time:  Friday June 11 2023 15:02:12 EDT Ventricular Rate:  86 PR Interval:  197 QRS Duration:  175 QT Interval:  442 QTC Calculation: 529 R Axis:   4  Text Interpretation: Sinus rhythm Right bundle branch block LVH with secondary repolarization abnormality Prolonged QT interval No acute changes Confirmed by Derwood Kaplan ([id number]) on 06/11/2023 4:56:01 PM  Radiology DG Chest Port 1 View  Result Date: 06/11/2023 CLINICAL DATA:  Chest congestion EXAM: PORTABLE CHEST 1 VIEW COMPARISON:  06/07/2023 FINDINGS: No acute airspace disease or effusion. Normal cardiac size with aortic atherosclerosis. No pneumothorax IMPRESSION: No active disease. Electronically Signed   By: Jasmine Pang M.D.   On: 06/11/2023 17:19    Procedures Procedures    Medications Ordered in ED Medications  aspirin chewable tablet 324 mg (324 mg Oral Given 06/11/23 1604)  alum & mag hydroxide-simeth (MAALOX/MYLANTA) 200-200-20 MG/5ML suspension 30 mL (30 mLs Oral Given 06/11/23 1604)    And  lidocaine (XYLOCAINE) 2 % viscous mouth solution 15 mL (15 mLs  Oral Given 06/11/23 1604)    ED Course/ Medical Decision Making/ A&P             HEART Score: 5                    Medical Decision Making Amount and/or Complexity of Data Reviewed Labs: ordered. Radiology: ordered.  Risk OTC drugs. Prescription drug management.   This patient presents to the ED for concern of chest pain/pressure, this involves an extensive number of treatment options, and is a complaint that carries with it a high risk of complications and morbidity.  The emergent differential diagnosis of chest pain includes: Acute coronary syndrome, pericarditis, aortic dissection, pulmonary embolism, tension pneumothorax, and esophageal rupture.  I do not believe the patient has an emergent cause of chest pain, other urgent/non-acute considerations include, but are not limited to: chronic angina, aortic stenosis, cardiomyopathy, myocarditis, mitral valve prolapse, pulmonary hypertension, hypertrophic obstructive cardiomyopathy (HOCM), aortic insufficiency, right ventricular hypertrophy, pneumonia, pleuritis, bronchitis, pneumothorax, tumor, gastroesophageal reflux disease (GERD), esophageal spasm, Mallory-Weiss syndrome, peptic ulcer disease, biliary disease, pancreatitis, functional gastrointestinal pain, cervical or thoracic disk disease or arthritis, shoulder arthritis, costochondritis, subacromial bursitis, anxiety or panic attack, herpes zoster, breast disorders, chest wall tumors, thoracic outlet syndrome, mediastinitis.  My initial workup includes ACS rule out, aspirin, GI cocktail  Additional history obtained from: Nursing notes from this visit. Previous records within EMR system 10 ED visits for similar in the past 6 months with most recent visit on 06/07/2023 EMS provides a portion of the history  I ordered, reviewed and interpreted labs which include: CBC, BMP, troponin, respiratory panel.  Troponin minimally elevated and at patient's baseline.  Stable anemia with a  hemoglobin of 8.6.  No leukocytosis.  Kidney function at baseline with a creatinine of 6.3, BUN of 70  I ordered imaging studies including chest x-ray I independently visualized and interpreted imaging which showed normal I agree with the radiologist interpretation  Cardiac Monitoring:  The patient was maintained on a cardiac monitor.  I personally viewed and interpreted the cardiac monitored which showed an underlying rhythm of: NSR  Afebrile, hemodynamically stable.  72 year old female presenting for evaluation of chest fullness and congestion.  This began an hour prior to arrival.  Symptoms have improved here in the emergency department.  Patient appears very well on physical exam.  She has some mild dementia it appears, however she is fully alert and oriented at this time.  She states she just has impaired memory.  Levophed was reassuring initial delta troponin are near patient's baseline which is mildly elevated.  Kidney function is at patient's baseline.  She does have a bit of an anemia, this is thought to be secondary to her chronic kidney disease.  EKG is unchanged from previous.  Chest x-ray is reassuring.  She has a heart score of 5.  I discussed options with the patient including admission to the hospital for monitoring versus following up with her primary care provider and cardiologist.  She is comfortable being discharged home.  Believe this is reasonable.  She was given return precautions.  Stable at discharge.  At this time there does not appear to be any evidence of an acute emergency medical condition and the patient appears stable for discharge with appropriate outpatient follow up. Diagnosis was discussed with patient who verbalizes understanding of care plan and is agreeable to discharge. I have discussed return precautions with patient who verbalizes understanding. Patient encouraged to follow-up with their PCP within 1 week. All questions answered.  Patient's case discussed with  Dr. Jearld Fenton who agrees with plan to discharge with follow-up.   Note: Portions of this report may have been transcribed using voice recognition software. Every effort was made to ensure accuracy; however, inadvertent computerized transcription errors may still be present.        Final Clinical Impression(s) / ED Diagnoses Final diagnoses:  Atypical chest pain    Rx / DC Orders ED Discharge Orders     None         Michelle Piper, Cordelia Poche 06/11/23 Robb Matar, MD 06/12/23 1943

## 2023-06-11 NOTE — ED Notes (Signed)
This Clinical research associate spoke with Teresa Poole's sister who is Teresa Poole's caretaker in regards to Teresa Poole's transport home due to discharge. Teresa Poole's sister states she is in [place] and her brother is in [place] and she is unable to pick patient up. This Clinical research associate explained to Teresa Poole's sister that due to Teresa Poole having confusion, Teresa Poole could not be left in the lobby or given a taxi/bus voucher and that Teresa Poole's sister would need to arrange transport.   Teresa Poole's sister states she is calling her husband to pick up patient.

## 2023-06-11 NOTE — Progress Notes (Signed)
[facility]   Hospice liaison note     This patient is a current hospice patient with [facility].    Liaison will continue to follow for any discharge planning needs and to coordinate continuation of hospice care.    Please don't hesitate to call with any Hospice related questions or concerns.    Thank you for the opportunity to participate in this patient's care.  Glenna Fellows, BSN, RN, OCN [employer] [phone]

## 2023-06-11 NOTE — ED Triage Notes (Signed)
Pt BIBGEMS from home after developing chest pressure and increased confusion. Pt lives at home alone, is alert and oriented x 4, but depending on the moment is not oriented. Pt seen here on Monday for the same. Pt is apart of [facility].   149 CBG 132/74 88 HR

## 2023-06-13 NOTE — Progress Notes (Unsigned)
Cardiology Office Note    Patient Name: Teresa Poole Date of Encounter: 06/13/2023  Primary Care Provider:  Malka So., MD Primary Cardiologist:  Lance Muss, MD Primary Electrophysiologist: None   Past Medical History    Past Medical History:  Diagnosis Date   Allergy    Anemia    Carotid stenosis    Chronic combined systolic and diastolic CHF (congestive heart failure) (HCC)    CKD (chronic kidney disease), stage IV (HCC) 09/24/2013   Pt at [facility], Dr. Marisue Humble   COVID-19 10/24/2021   Diabetes mellitus type 2 in nonobese [facility])    Edema 09/14/2013   GERD (gastroesophageal reflux disease)    History of ovarian cancer    Hyperlipidemia    Hypertension    Mild CAD    non-obstructive by LHC (09/25/2013): Proximal and mid LAD serial 20%, proximal circumflex 30%, mid AV groove circumflex 30%, mid RCA mild plaque.   NICM (nonischemic cardiomyopathy) (HCC)    Obesity (BMI 30-39.9)    RBBB    Renal artery stenosis (HCC) 10/11/2018   Thyroid disease    Seen by specialist    History of Present Illness  Teresa Poole is a 72 y.o. female with a PMH of nonobstructive CAD s/p LHC 2015, chronic combined CHF, NICM, RBBB, HLD, HTN, CKD stage IV, GERD, carotid stenosis (1-39% right, 40-59% left) and left renal artery stenosis s/p stenting 09/2018, cognitive impairment who presents today for posthospital follow-up.  Teresa Poole has been followed by our practice since 2015 when she was evaluated for complaint of shortness of breath and elevated BP.  2D echo was completed showing EF of 30-35% due to uncontrolled HTN in the setting of noncompliance.  She underwent a Lexiscan that showed inferior lateral scar at the distal anterior septal wall and small region of reversibility at the septum.  She had a LHC completed 09/25/2013 that showed nonobstructive CAD nonischemic cardiomyopathy mentation for medical management.  She completed a renal artery duplex that revealed  stenosis in 2016 with recommendation for stenting however patient did not complete.  She underwent stenting procedure on 10/2018.  She was admitted in 09/2020 with complaint of dyspnea on exertion.  2D echo was completed on 05/2021 showed EF 55-60%, mild hypokinesis in the basal inferior wall with normal wall motion in the remaining myocardial wall segments.  He has been seen in the ED several times with various complaints from nausea and vomiting, dizziness, chest pain, altered mental status. She was seen 02/2022 with complaint of chest pain which she described as sulfuric acid burning sensation in her throat.  Her EKG was unchanged from prior visits and troponins were flat and pain deemed noncardiac in nature.  She was found to have mild AKI and lisinopril was held.  She was seen in the ED on 11/05/2022 with complaint of chest pain and found to have elevated troponin.  She also endorsed neck pain and whooshing sensation in her ear.  She was found to have systolic blood pressures greater than 200 in the setting of medication noncompliance.  2D echo was completed showing diminished LV function with EF of 45 to 50% and improvement to chest pain.  She was admitted and became agitated during her stay with suicidal ideations.  She was involuntarily committed and was discharged in her sister's care at home.    She was seen in follow-up on 12/14/2022 by Jari Favre, PA.  During visit patient reported shortness of breath and exertional chest pain with  head stuffiness secondary to head cold.  Her blood pressures were noted to be extremely high in the 200s systolically.  Imdur was increased and patient deferred starting additional BP medication at that time.  She was referred to the Pharm.D. for evaluation and titration.  During her follow-up patient was not clear if she had taken her blood pressure medications and was unclear what medicine she was actually taking.  She reported possibly taking carvedilol once a day and was  advised to take it twice daily.  She was admitted 5/30 to 01/29/2023 due to low hemoglobin and volume overload.  She was found to have lower extremity edema and kidney function had worsened.  With a chest x-ray that showed pulmonary edema with trace bilateral pleural effusion.  She was diuresed with Lasix and patient had previously underwent EGD in 11/2022 that revealed Barrett's esophagus.  She was seen back in the ED in August with complaint of chest pain but eloped prior to completion of second troponin.  She was admitted 2 weeks later due to unintentional medication overdose in which she had taken her a.m. and p.m. doses of medications together.  She was seen on 10/18 with complaint of chest pain and increased confusion.  She reported resolution of discomfort prior to the ED MD examining the patient.  Chest x-ray showed no active cardiopulmonary disease discomfort was found to be atypical with possibility of symptoms being related to constipation.  During today's visit the patient reports that she is no longer experiencing chest discomfort however does endorse gas-like pain that she reports is related to not eating.  During today's visit patient was preoccupied and unable to clearly answer questions.  She was unable to confirm any of her medications and was not aware that she was on medication.  Her sister was available in the lobby and was brought back for the remainder of the visit.  Per her sister she is not compliant with her medications and becomes combative and angry when discussing medications.she was noted to have improved blood pressures today BP at 138/70 and heart rate was 58 bpm.  She was unable to report if medications were taken since her hospital discharge.  Her sister was visibly frustrated and when asked regarding psych consultation and workup she reports that her sister refuses and will not cooperate.  During today's visit we discussed mental illness and possible referral and patient once again  refused and stated " my mind is clear and I do not need anyone to tell me what to think". She reports that she and her husband are the only family close by and that she does her best to take care of her sister.  She recently went out of town over the weekend and asked her husband to help in organizing her sisters medications.  However her sister refused to have the husband assist and would not take medications from him.    In regards to her cardiac health it appears that she is currently stable with no acute problems or concerns.  This is a very difficult and unique situation and I will reach out to our social work team to see if they can offer any assistance or further recommendations.  Patient denies chest pain, palpitations, dyspnea, PND, orthopnea, nausea, vomiting, dizziness, syncope, edema, weight gain, or early satiety.  Review of Systems  Please see the history of present illness.    All other systems reviewed and are otherwise negative except as noted above.  Physical Exam  Wt Readings from Last 3 Encounters:  06/11/23 171 lb (77.6 kg)  04/19/23 171 lb 15.3 oz (78 kg)  04/06/23 171 lb 15.3 oz (78 kg)   VF:IEPPI were no vitals filed for this visit.,There is no height or weight on file to calculate BMI. GEN: Well nourished, well developed in no acute distress Neck: No JVD; No carotid bruits Pulmonary: Clear to auscultation without rales, wheezing or rhonchi  Cardiovascular: Normal rate. Regular rhythm. Normal S1. Normal S2.   Murmurs: There is no murmur.  ABDOMEN: Soft, non-tender, non-distended EXTREMITIES:  No edema; No deformity   EKG/LABS/ Recent Cardiac Studies   ECG personally reviewed by me today -none completed today  Risk Assessment/Calculations:          Lab Results  Component Value Date   WBC 5.1 06/11/2023   HGB 8.6 (L) 06/11/2023   HCT 26.2 (L) 06/11/2023   MCV 87.6 06/11/2023   PLT 224 06/11/2023   Lab Results  Component Value Date   CREATININE 6.30 (H)  06/11/2023   BUN 70 (H) 06/11/2023   NA 140 06/11/2023   K 4.4 06/11/2023   CL 104 06/11/2023   CO2 21 (L) 06/11/2023   Lab Results  Component Value Date   CHOL 195 12/14/2022   HDL 61 12/14/2022   LDLCALC 112 (H) 12/14/2022   LDLDIRECT 111.0 09/20/2019   TRIG 124 12/14/2022   CHOLHDL 3.2 12/14/2022    Lab Results  Component Value Date   HGBA1C 6.3 (H) 11/05/2022   Assessment & Plan    1.  Chest pain: -Patient reports discomfort feeling (like gas pain) but notes no acute concerns today. -She is unable to confirm if she has taken medications and is not aware of what medications she takes. -Continue Imdur 30 mg daily -ED precautions were discussed and patient is aware to seek follow-up comfort reoccurs and is not relieved with Imdur.  2.  Essential hypertension: -Patient's blood pressure today was 138/70 -Continue carvedilol 12.5 mg twice daily  3.  HFrEF/NICM: -Patient's last 2D echo was completed 10/2022 revealing mildly reduced EF of 45-50% with mild concentric LVH and grade 2 DD with moderately dilated LA and trivial MVR -Patient is euvolemic on examination today -Continue carvedilol 12.5 mg twice daily, Imdur 30 mg daily -Due to elevated creatinine patient is unable to tolerate ACE/ARB/MRA -Low sodium diet, fluid restriction <2L, and daily weights encouraged. Educated to contact our office for weight gain of 2 lbs overnight or 5 lbs in one week.   4.  CKD stage IV: -Patient/creatinine was 6.25 -Patient was advised to follow-up with PCP regarding referral to nephrology.  5.  Memory deficits: -Patient was unable to recall her recent ED visit or any of her medications during today's visit. -She has no formal diagnosis of dementia and would benefit from possible psychiatric evaluation however refused follow-up during today's visit.     Disposition: Follow-up with Lance Muss, MD or APP in 6 months    Signed, Napoleon Form, Leodis Rains, NP 06/13/2023, 11:42 AM [facility]

## 2023-06-14 ENCOUNTER — Encounter: Payer: Self-pay | Admitting: Nurse Practitioner

## 2023-06-14 ENCOUNTER — Ambulatory Visit: Payer: Medicare PPO | Attending: Nurse Practitioner | Admitting: Nurse Practitioner

## 2023-06-14 VITALS — BP 138/70 | HR 58 | Ht 67.0 in | Wt 155.4 lb

## 2023-06-14 DIAGNOSIS — R0789 Other chest pain: Secondary | ICD-10-CM

## 2023-06-14 DIAGNOSIS — I428 Other cardiomyopathies: Secondary | ICD-10-CM

## 2023-06-14 DIAGNOSIS — N184 Chronic kidney disease, stage 4 (severe): Secondary | ICD-10-CM | POA: Diagnosis not present

## 2023-06-14 DIAGNOSIS — I5020 Unspecified systolic (congestive) heart failure: Secondary | ICD-10-CM | POA: Diagnosis not present

## 2023-06-14 DIAGNOSIS — I10 Essential (primary) hypertension: Secondary | ICD-10-CM | POA: Diagnosis not present

## 2023-06-14 DIAGNOSIS — R413 Other amnesia: Secondary | ICD-10-CM

## 2023-06-14 NOTE — Patient Instructions (Signed)
Medication Instructions:  Your physician recommends that you continue on your current medications as directed. Please refer to the Current Medication list given to you today. *If you need a refill on your cardiac medications before your next appointment, please call your pharmacy*   Lab Work: None ordered   Testing/Procedures: None ordered   Follow-Up: At [facility], you and your health needs are our priority.  As part of our continuing mission to provide you with exceptional heart care, we have created designated Provider Care Teams.  These Care Teams include your primary Cardiologist (physician) and Advanced Practice Providers (APPs -  Physician Assistants and Nurse Practitioners) who all work together to provide you with the care you need, when you need it.  We recommend signing up for the patient portal called "MyChart".  Sign up information is provided on this After Visit Summary.  MyChart is used to connect with patients for Virtual Visits (Telemedicine).  Patients are able to view lab/test results, encounter notes, upcoming appointments, etc.  Non-urgent messages can be sent to your provider as well.   To learn more about what you can do with MyChart, go to [email or web address].    Your next appointment:   6 month(s)  Provider:   Truett Mainland, MD  Other Instructions

## 2023-07-08 ENCOUNTER — Emergency Department (HOSPITAL_COMMUNITY)

## 2023-07-08 ENCOUNTER — Encounter (HOSPITAL_COMMUNITY): Payer: Self-pay | Admitting: Emergency Medicine

## 2023-07-08 ENCOUNTER — Emergency Department (HOSPITAL_COMMUNITY)
Admission: EM | Admit: 2023-07-08 | Discharge: 2023-07-08 | Disposition: A | Discharge status: Home / Self Care | Attending: Emergency Medicine | Admitting: Emergency Medicine

## 2023-07-08 ENCOUNTER — Other Ambulatory Visit: Payer: Self-pay

## 2023-07-08 DIAGNOSIS — Z79899 Other long term (current) drug therapy: Secondary | ICD-10-CM | POA: Diagnosis not present

## 2023-07-08 DIAGNOSIS — Z7982 Long term (current) use of aspirin: Secondary | ICD-10-CM | POA: Diagnosis not present

## 2023-07-08 DIAGNOSIS — R079 Chest pain, unspecified: Secondary | ICD-10-CM | POA: Diagnosis present

## 2023-07-08 DIAGNOSIS — E1122 Type 2 diabetes mellitus with diabetic chronic kidney disease: Secondary | ICD-10-CM | POA: Diagnosis not present

## 2023-07-08 DIAGNOSIS — I159 Secondary hypertension, unspecified: Secondary | ICD-10-CM

## 2023-07-08 DIAGNOSIS — I151 Hypertension secondary to other renal disorders: Secondary | ICD-10-CM | POA: Diagnosis not present

## 2023-07-08 DIAGNOSIS — N189 Chronic kidney disease, unspecified: Secondary | ICD-10-CM | POA: Diagnosis not present

## 2023-07-08 LAB — CBC
HCT: 26.2 % — ABNORMAL LOW (ref 36.0–46.0)
Hemoglobin: 8.3 g/dL — ABNORMAL LOW (ref 12.0–15.0)
MCH: 29.1 pg (ref 26.0–34.0)
MCHC: 31.7 g/dL (ref 30.0–36.0)
MCV: 91.9 fL (ref 80.0–100.0)
Platelets: 244 10*3/uL (ref 150–400)
RBC: 2.85 MIL/uL — ABNORMAL LOW (ref 3.87–5.11)
RDW: 13.5 % (ref 11.5–15.5)
WBC: 6 10*3/uL (ref 4.0–10.5)
nRBC: 0 % (ref 0.0–0.2)

## 2023-07-08 LAB — BASIC METABOLIC PANEL
Anion gap: 12 (ref 5–15)
BUN: 70 mg/dL — ABNORMAL HIGH (ref 8–23)
CO2: 21 mmol/L — ABNORMAL LOW (ref 22–32)
Calcium: 8.1 mg/dL — ABNORMAL LOW (ref 8.9–10.3)
Chloride: 109 mmol/L (ref 98–111)
Creatinine, Ser: 6.75 mg/dL — ABNORMAL HIGH (ref 0.44–1.00)
GFR, Estimated: 6 mL/min — ABNORMAL LOW (ref >60)
Glucose, Bld: 118 mg/dL — ABNORMAL HIGH (ref 70–99)
Potassium: 4.4 mmol/L (ref 3.5–5.1)
Sodium: 142 mmol/L (ref 135–145)

## 2023-07-08 LAB — TROPONIN I (HIGH SENSITIVITY)
Troponin I (High Sensitivity): 23 ng/L — ABNORMAL HIGH (ref <18)
Troponin I (High Sensitivity): 21 ng/L — ABNORMAL HIGH (ref <18)

## 2023-07-08 NOTE — ED Provider Notes (Signed)
[place] EMERGENCY DEPARTMENT AT [facility] Provider Note   CSN: [id number] Arrival date & time: 07/08/23  1625     History  Chief Complaint  Patient presents with   Chest Pain    Teresa Poole is a 72 y.o. female.  Patient is a 72 yo female with pmh htn, diabetes, GERD, hyperlipidemia, CKD presenting for elevated blood pressure reading. Patient states she has a cold right now and has nasal congestion and sinus pressure. States she felt like she had elevated blood pressure due to "swooshing noise in ears" so she called EMS. EMS reports a bp of 140/ on arrival. She denies any chest pain, chest tightness, neck pain, back pain, or sob. She didn't take any prescriptions for her cold.   Patient states she is coming to ED because she is not on any blood pressure medications. Her chart indicates she is prescribed amlodipine 10 mg daily and carvedilol 12.5 mg bid. Patient states she is compliant with all of her home medication but does not know the name of them and that her sister makes sure she takes all of them correctly.   The history is provided by the patient. No language interpreter was used.  Chest Pain Associated symptoms: no abdominal pain, no back pain, no cough, no fever, no palpitations, no shortness of breath and no vomiting        Home Medications Prior to Admission medications   Medication Sig Start Date End Date Taking? Authorizing Provider  amLODipine (NORVASC) 10 MG tablet Take 1 tablet (10 mg total) by mouth daily. 01/06/23   Corky Crafts, MD  aspirin EC 81 MG tablet Take 1 tablet (81 mg total) by mouth daily. Swallow whole. Patient taking differently: Take 81 mg by mouth at bedtime. Swallow whole. 11/14/22   Regalado, Belkys A, MD  azelastine (ASTELIN) 0.1 % nasal spray Place 2 sprays into both nostrils 2 (two) times daily. 12/02/22   [provider]  carvedilol (COREG) 12.5 MG tablet Take 1 tablet (12.5 mg total) by mouth 2 (two) times  daily with a meal. 01/29/23   Burnadette Pop, MD  cetirizine (ZYRTEC) 10 MG tablet Take 10 mg by mouth daily as needed for allergies. 11/19/22   [provider]  cyanocobalamin 1000 MCG tablet Take 1 tablet (1,000 mcg total) by mouth daily. 11/14/22   Regalado, Belkys A, MD  ferrous sulfate 325 (65 FE) MG tablet Take 1 tablet (325 mg total) by mouth daily. 01/29/23   Burnadette Pop, MD  glucose blood (ONE TOUCH ULTRA TEST) test strip Use to check blood sugar 3 times daily Dx code E11.65 Patient taking differently: 1 each by Other route See admin instructions. Use to check blood sugar 3 times daily Dx code E11.65 11/20/14   Reather Littler, MD  haloperidol (HALDOL) 1 MG tablet Take 1 tablet (1 mg total) by mouth every 3 (three) hours as needed for agitation. 01/29/23   Burnadette Pop, MD  isosorbide mononitrate (IMDUR) 30 MG 24 hr tablet Take 1 tablet (30 mg total) by mouth daily. 01/30/23   Burnadette Pop, MD  OZEMPIC, 0.25 OR 0.5 MG/DOSE, 2 MG/3ML SOPN Inject 0.25 mg into the skin once a week. 12/20/22   [provider]  pantoprazole (PROTONIX) 20 MG tablet Take 1 tablet (20 mg total) by mouth 2 (two) times daily. Patient taking differently: Take 40 mg by mouth every evening. 12/22/22 04/21/23  Dorcas Carrow, MD  patiromer (VELTASSA) 8.4 g packet Take 1 packet (8.4  g total) by mouth daily. 02/01/23   Russella Dar, NP  polyethylene glycol powder (GLYCOLAX/MIRALAX) 17 GM/SCOOP powder Take 17 g by mouth daily. 01/30/23   Burnadette Pop, MD  rosuvastatin (CRESTOR) 40 MG tablet Take 1 tablet (40 mg total) by mouth daily. 12/17/22   Sharlene Dory, PA-C  senna (SENOKOT) 8.6 MG TABS tablet Take 1 tablet (8.6 mg total) by mouth daily. 01/30/23   Burnadette Pop, MD  sucralfate (CARAFATE) 1 g tablet Take 1 tablet (1 g total) by mouth with breakfast, with lunch, and with evening meal for 7 days. 12/27/22 01/03/23  Tanda Rockers A, DO  thiamine (VITAMIN B-1) 100 MG tablet Take 1 tablet (100 mg total) by mouth  daily. 11/14/22   Regalado, Belkys A, MD  iron polysaccharides (NIFEREX) 150 MG capsule Take 1 capsule (150 mg total) by mouth daily. Patient not taking: Reported on 12/22/2022 11/14/22 01/29/23  Hartley Barefoot A, MD      Allergies    Hydralazine hcl and Atorvastatin    Review of Systems   Review of Systems  Constitutional:  Negative for chills and fever.  HENT:  Positive for congestion and sinus pain. Negative for ear pain and sore throat.   Eyes:  Negative for pain and visual disturbance.  Respiratory:  Negative for cough and shortness of breath.   Cardiovascular:  Negative for chest pain and palpitations.  Gastrointestinal:  Negative for abdominal pain and vomiting.  Genitourinary:  Negative for dysuria and hematuria.  Musculoskeletal:  Negative for arthralgias and back pain.  Skin:  Negative for color change and rash.  Neurological:  Negative for seizures and syncope.  All other systems reviewed and are negative.   Physical Exam Updated Vital Signs BP (!) 143/71   Pulse 72   Temp 97.8 F (36.6 C) (Oral)   Resp 10   Ht 5\' 7"  (1.702 m)   Wt 70.5 kg   SpO2 100%   BMI 24.34 kg/m  Physical Exam Vitals and nursing note reviewed.  Constitutional:      General: She is not in acute distress.    Appearance: She is well-developed.  HENT:     Head: Normocephalic and atraumatic.  Eyes:     Conjunctiva/sclera: Conjunctivae normal.  Cardiovascular:     Rate and Rhythm: Normal rate and regular rhythm.     Heart sounds: No murmur heard. Pulmonary:     Effort: Pulmonary effort is normal. No respiratory distress.     Breath sounds: Normal breath sounds.  Abdominal:     Palpations: Abdomen is soft.     Tenderness: There is no abdominal tenderness.  Musculoskeletal:        General: No swelling.     Cervical back: Neck supple.  Skin:    General: Skin is warm and dry.     Capillary Refill: Capillary refill takes less than 2 seconds.  Neurological:     Mental Status: She is  alert.  Psychiatric:        Mood and Affect: Mood normal.     ED Results / Procedures / Treatments   Labs (all labs ordered are listed, but only abnormal results are displayed) Labs Reviewed  BASIC METABOLIC PANEL - Abnormal; Notable for the following components:      Result Value   CO2 21 (*)    Glucose, Bld 118 (*)    BUN 70 (*)    Creatinine, Ser 6.75 (*)    Calcium 8.1 (*)    GFR, Estimated  6 (*)    All other components within normal limits  CBC - Abnormal; Notable for the following components:   RBC 2.85 (*)    Hemoglobin 8.3 (*)    HCT 26.2 (*)    All other components within normal limits  TROPONIN I (HIGH SENSITIVITY) - Abnormal; Notable for the following components:   Troponin I (High Sensitivity) 23 (*)    All other components within normal limits  TROPONIN I (HIGH SENSITIVITY) - Abnormal; Notable for the following components:   Troponin I (High Sensitivity) 21 (*)    All other components within normal limits    EKG None  Radiology DG Chest 2 View  Result Date: 07/08/2023 CLINICAL DATA:  Chest pain. EXAM: CHEST - 2 VIEW COMPARISON:  June 11, 2023 FINDINGS: The heart size and mediastinal contours are within normal limits. There is marked severity calcification of the thoracic aorta. Both lungs are clear. Multilevel degenerative changes seen throughout the thoracic spine. IMPRESSION: No active cardiopulmonary disease. Electronically Signed   By: Aram Candela M.D.   On: 07/08/2023 19:51    Procedures Procedures    Medications Ordered in ED Medications - No data to display  ED Course/ Medical Decision Making/ A&P                                 Medical Decision Making Amount and/or Complexity of Data Reviewed Labs: ordered. Radiology: ordered.   3:00 PM 72 yo female with pmh htn, diabetes, GERD, hyperlipidemia, CKD presenting for elevated blood pressure reading.  Patient is alert and oriented x 3, no acute distress, afebrile, stable vital signs.   Blood pressure 143/71.  Patient avidly denies any chest pain or history of chest pain.  She states she thinks there was some confusion with the triage evaluation.  She admits to sinus and nasal congestion but no chest congestion or chest pressure.  Her ECG is stable and demonstrate sinus rhythm.  Her troponins appear to be elevated at baseline.  It appears to be her normal and the delta troponin is flat.  Her electrolytes are stable.  Her chest x-ray demonstrates no acute process.  She has chronic kidney disease and her creatinine and GFR are currently at her baseline. BP remeains stable and at and/or under 143/74 throughout entire stay in ED.  No interventions at this time.  Recommend outpatient management with her primary care physician further blood pressure control.  Patient in no distress and overall condition improved here in the ED. Detailed discussions were had with the patient regarding current findings, and need for close f/u with PCP or on call doctor. The patient has been instructed to return immediately if the symptoms worsen in any way for re-evaluation. Patient verbalized understanding and is in agreement with current care plan. All questions answered prior to discharge.         Final Clinical Impression(s) / ED Diagnoses Final diagnoses:  Secondary hypertension    Rx / DC Orders ED Discharge Orders     None         Franne Forts, DO 07/09/23 1500

## 2023-07-08 NOTE — ED Provider Triage Note (Signed)
Emergency Medicine Provider Triage Evaluation Note  Teresa Poole , a 72 y.o. female  was evaluated in triage.  Pt complains of hypertension.  Patient states that she was concerned her blood pressure was getting elevated at home.  Patient's blood pressure systolic at home was in the 140s.  Patient denies any chest pain, shortness of breath, abdominal pain, nausea/vomiting, vision changes, headache.  Review of Systems  Positive: See HPI Negative: See HPI  Physical Exam  BP 135/74 (BP Location: Right Arm)   Pulse 77   Temp 98.5 F (36.9 C) (Oral)   Resp 17   SpO2 100%  Gen:   Awake, no distress   Resp:  Normal effort  MSK:   Moves extremities without difficulty  Other:    Medical Decision Making  Medically screening exam initiated at 5:36 PM.  Appropriate orders placed.  Teresa Poole was informed that the remainder of the evaluation will be completed by another provider, this initial triage assessment does not replace that evaluation, and the importance of remaining in the ED until their evaluation is complete.  Workup initiated, patient stable at this time   Remi Deter 07/08/23 1737

## 2023-07-08 NOTE — ED Notes (Signed)
Primary nurse called PTs sister and informed her that her sister is confused and we can not send her in a txi in this condition I also informed the sister that we can not send her by [facility] if there will not be a person there to receive her. PTS sister stated that she is out of town and that if we give her sister 30 min, she will start acting herself again.

## 2023-07-08 NOTE — ED Triage Notes (Signed)
Pt from home via [facility] with reports of burning chest pain that started 1 hour ago. Pt has hx of same. Denies SHOB.

## 2023-07-08 NOTE — ED Notes (Signed)
PT is requesting a taxi and calling herself

## 2023-07-08 NOTE — Discharge Instructions (Signed)
Please call your cardiologist and/or primary care physician for further management of high blood pressure.  Continue to take your amlodipine and metoprolol.

## 2023-07-08 NOTE — ED Notes (Signed)
PT sister has been contacted and is aware and agreed to her sister going home in a taxi. Tech will push PT out and ensure that he has the right address to get PT home.

## 2023-12-23 DEATH — deceased
